# Patient Record
Sex: Female | Born: 1965 | Race: White | Hispanic: No | Marital: Single | Smoking: Never smoker
Health system: Southern US, Community
[De-identification: ages and names within clinical notes are randomized; demographics above are authoritative.]

## PROBLEM LIST (undated history)

## (undated) DIAGNOSIS — C55 Malignant neoplasm of uterus, part unspecified: Secondary | ICD-10-CM

## (undated) DIAGNOSIS — Z8489 Family history of other specified conditions: Secondary | ICD-10-CM

## (undated) DIAGNOSIS — R188 Other ascites: Secondary | ICD-10-CM

## (undated) DIAGNOSIS — Z9889 Other specified postprocedural states: Secondary | ICD-10-CM

## (undated) DIAGNOSIS — D509 Iron deficiency anemia, unspecified: Secondary | ICD-10-CM

## (undated) DIAGNOSIS — L03115 Cellulitis of right lower limb: Secondary | ICD-10-CM

## (undated) DIAGNOSIS — S32020A Wedge compression fracture of second lumbar vertebra, initial encounter for closed fracture: Secondary | ICD-10-CM

## (undated) DIAGNOSIS — R112 Nausea with vomiting, unspecified: Secondary | ICD-10-CM

## (undated) DIAGNOSIS — I82409 Acute embolism and thrombosis of unspecified deep veins of unspecified lower extremity: Secondary | ICD-10-CM

## (undated) DIAGNOSIS — E669 Obesity, unspecified: Secondary | ICD-10-CM

## (undated) DIAGNOSIS — K8020 Calculus of gallbladder without cholecystitis without obstruction: Secondary | ICD-10-CM

## (undated) HISTORY — PX: TONSILLECTOMY AND ADENOIDECTOMY: SUR1326

## (undated) HISTORY — PX: PARACENTESIS: SHX844

## (undated) HISTORY — PX: WISDOM TOOTH EXTRACTION: SHX21

## (undated) HISTORY — DX: Malignant neoplasm of uterus, part unspecified: C55

## (undated) HISTORY — DX: Acute embolism and thrombosis of unspecified deep veins of unspecified lower extremity: I82.409

## (undated) HISTORY — DX: Obesity, unspecified: E66.9

---

## 1988-04-23 HISTORY — PX: ANKLE SURGERY: SHX546

## 2007-04-24 DIAGNOSIS — I82409 Acute embolism and thrombosis of unspecified deep veins of unspecified lower extremity: Secondary | ICD-10-CM

## 2007-04-24 HISTORY — DX: Acute embolism and thrombosis of unspecified deep veins of unspecified lower extremity: I82.409

## 2008-03-30 ENCOUNTER — Inpatient Hospital Stay (HOSPITAL_COMMUNITY): Admission: EM | Admit: 2008-03-30 | Discharge: 2008-04-07 | Payer: Self-pay | Admitting: Emergency Medicine

## 2008-03-30 ENCOUNTER — Ambulatory Visit: Payer: Self-pay | Admitting: Family Medicine

## 2008-03-30 ENCOUNTER — Encounter (HOSPITAL_COMMUNITY): Payer: Self-pay | Admitting: Family Medicine

## 2008-03-30 ENCOUNTER — Encounter: Payer: Self-pay | Admitting: Family Medicine

## 2008-03-30 ENCOUNTER — Ambulatory Visit: Payer: Self-pay | Admitting: Vascular Surgery

## 2008-04-23 DIAGNOSIS — C55 Malignant neoplasm of uterus, part unspecified: Secondary | ICD-10-CM

## 2008-04-23 HISTORY — DX: Malignant neoplasm of uterus, part unspecified: C55

## 2008-06-28 ENCOUNTER — Ambulatory Visit (HOSPITAL_COMMUNITY): Admission: RE | Admit: 2008-06-28 | Discharge: 2008-06-28 | Payer: Self-pay | Admitting: Obstetrics and Gynecology

## 2008-06-28 ENCOUNTER — Encounter (INDEPENDENT_AMBULATORY_CARE_PROVIDER_SITE_OTHER): Payer: Self-pay | Admitting: Obstetrics and Gynecology

## 2008-07-28 ENCOUNTER — Ambulatory Visit: Admission: RE | Admit: 2008-07-28 | Discharge: 2008-07-28 | Payer: Self-pay | Admitting: Gynecologic Oncology

## 2008-08-25 ENCOUNTER — Encounter: Admission: RE | Admit: 2008-08-25 | Discharge: 2008-11-23 | Payer: Self-pay | Admitting: Family Medicine

## 2008-12-21 ENCOUNTER — Encounter: Admission: RE | Admit: 2008-12-21 | Discharge: 2009-03-21 | Payer: Self-pay | Admitting: Family Medicine

## 2009-02-10 ENCOUNTER — Ambulatory Visit: Admission: RE | Admit: 2009-02-10 | Discharge: 2009-02-10 | Payer: Self-pay | Admitting: Gynecologic Oncology

## 2009-02-11 ENCOUNTER — Ambulatory Visit (HOSPITAL_COMMUNITY): Admission: RE | Admit: 2009-02-11 | Discharge: 2009-02-11 | Payer: Self-pay | Admitting: Gynecologic Oncology

## 2009-03-21 ENCOUNTER — Encounter: Admission: RE | Admit: 2009-03-21 | Discharge: 2009-04-20 | Payer: Self-pay | Admitting: Family Medicine

## 2009-05-05 ENCOUNTER — Ambulatory Visit: Admission: RE | Admit: 2009-05-05 | Discharge: 2009-05-05 | Payer: Self-pay | Admitting: Gynecologic Oncology

## 2009-05-06 ENCOUNTER — Encounter: Admission: RE | Admit: 2009-05-06 | Discharge: 2009-08-04 | Payer: Self-pay | Admitting: Family Medicine

## 2009-05-17 ENCOUNTER — Encounter: Admission: RE | Admit: 2009-05-17 | Discharge: 2009-05-17 | Payer: Self-pay | Admitting: Family Medicine

## 2009-09-06 ENCOUNTER — Encounter: Admission: RE | Admit: 2009-09-06 | Discharge: 2009-12-05 | Payer: Self-pay | Admitting: Family Medicine

## 2009-10-27 ENCOUNTER — Ambulatory Visit: Admission: RE | Admit: 2009-10-27 | Discharge: 2009-10-27 | Payer: Self-pay | Admitting: Gynecologic Oncology

## 2009-12-12 ENCOUNTER — Encounter: Admission: RE | Admit: 2009-12-12 | Discharge: 2010-03-12 | Payer: Self-pay | Admitting: Family Medicine

## 2009-12-22 HISTORY — PX: ABDOMINAL HYSTERECTOMY: SHX81

## 2009-12-27 ENCOUNTER — Encounter: Payer: Self-pay | Admitting: Gynecologic Oncology

## 2009-12-27 ENCOUNTER — Ambulatory Visit (HOSPITAL_COMMUNITY): Admission: RE | Admit: 2009-12-27 | Discharge: 2009-12-28 | Payer: Self-pay | Admitting: Gynecologic Oncology

## 2010-02-09 ENCOUNTER — Ambulatory Visit: Admission: RE | Admit: 2010-02-09 | Discharge: 2010-02-09 | Payer: Self-pay | Admitting: Gynecologic Oncology

## 2010-03-21 ENCOUNTER — Encounter
Admission: RE | Admit: 2010-03-21 | Discharge: 2010-05-23 | Payer: Self-pay | Source: Home / Self Care | Attending: Family Medicine | Admitting: Family Medicine

## 2010-06-26 ENCOUNTER — Encounter: Admit: 2010-06-26 | Payer: Self-pay | Admitting: Family Medicine

## 2010-06-26 ENCOUNTER — Encounter: Payer: BC Managed Care – PPO | Attending: Family Medicine | Admitting: *Deleted

## 2010-06-26 DIAGNOSIS — Z713 Dietary counseling and surveillance: Secondary | ICD-10-CM | POA: Insufficient documentation

## 2010-06-28 ENCOUNTER — Other Ambulatory Visit: Payer: Self-pay | Admitting: Obstetrics and Gynecology

## 2010-06-28 DIAGNOSIS — Z1231 Encounter for screening mammogram for malignant neoplasm of breast: Secondary | ICD-10-CM

## 2010-07-06 LAB — CBC
HCT: 42.1 % (ref 36.0–46.0)
MCHC: 35 g/dL (ref 30.0–36.0)
Platelets: 149 10*3/uL — ABNORMAL LOW (ref 150–400)
Platelets: 160 10*3/uL (ref 150–400)
RDW: 12.8 % (ref 11.5–15.5)
RDW: 13.1 % (ref 11.5–15.5)
WBC: 6.2 10*3/uL (ref 4.0–10.5)

## 2010-07-06 LAB — COMPREHENSIVE METABOLIC PANEL
ALT: 41 U/L — ABNORMAL HIGH (ref 0–35)
BUN: 8 mg/dL (ref 6–23)
CO2: 27 mEq/L (ref 19–32)
Calcium: 9 mg/dL (ref 8.4–10.5)
GFR calc Af Amer: >60 mL/min (ref >60)
GFR calc non Af Amer: >60 mL/min (ref >60)
Glucose, Bld: 81 mg/dL (ref 70–99)
Sodium: 140 mEq/L (ref 135–145)
Total Protein: 6.9 g/dL (ref 6.0–8.3)

## 2010-07-06 LAB — DIFFERENTIAL
Basophils Absolute: 0 10*3/uL (ref 0.0–0.1)
Basophils Relative: 1 % (ref 0–1)
Eosinophils Absolute: 0.2 10*3/uL (ref 0.0–0.7)
Eosinophils Relative: 3 % (ref 0–5)
Monocytes Absolute: 0.4 10*3/uL (ref 0.1–1.0)
Neutro Abs: 4 10*3/uL (ref 1.7–7.7)

## 2010-07-06 LAB — SURGICAL PCR SCREEN: Staphylococcus aureus: NEGATIVE

## 2010-07-06 LAB — BASIC METABOLIC PANEL
BUN: 9 mg/dL (ref 6–23)
CO2: 27 mEq/L (ref 19–32)
Calcium: 8.6 mg/dL (ref 8.4–10.5)
Chloride: 106 mEq/L (ref 96–112)
Creatinine, Ser: 0.88 mg/dL (ref 0.4–1.2)
Glucose, Bld: 184 mg/dL — ABNORMAL HIGH (ref 70–99)

## 2010-07-06 LAB — TYPE AND SCREEN

## 2010-07-26 ENCOUNTER — Ambulatory Visit
Admission: RE | Admit: 2010-07-26 | Discharge: 2010-07-26 | Disposition: A | Discharge status: Home / Self Care | Payer: BC Managed Care – PPO | Source: Ambulatory Visit | Attending: Obstetrics and Gynecology | Admitting: Obstetrics and Gynecology

## 2010-07-26 DIAGNOSIS — Z1231 Encounter for screening mammogram for malignant neoplasm of breast: Secondary | ICD-10-CM

## 2010-07-27 ENCOUNTER — Encounter: Payer: BC Managed Care – PPO | Attending: Family Medicine | Admitting: *Deleted

## 2010-07-27 DIAGNOSIS — Z713 Dietary counseling and surveillance: Secondary | ICD-10-CM | POA: Insufficient documentation

## 2010-08-03 LAB — COMPREHENSIVE METABOLIC PANEL
AST: 27 U/L (ref 0–37)
Albumin: 3.6 g/dL (ref 3.5–5.2)
Alkaline Phosphatase: 42 U/L (ref 39–117)
CO2: 23 mEq/L (ref 19–32)
Chloride: 106 mEq/L (ref 96–112)
GFR calc non Af Amer: >60 mL/min (ref >60)
Sodium: 138 mEq/L (ref 135–145)
Total Bilirubin: 0.5 mg/dL (ref 0.3–1.2)
Total Protein: 7.5 g/dL (ref 6.0–8.3)

## 2010-08-03 LAB — HEMOGLOBIN AND HEMATOCRIT, BLOOD
HCT: 32 % — ABNORMAL LOW (ref 36.0–46.0)
Hemoglobin: 10.2 g/dL — ABNORMAL LOW (ref 12.0–15.0)

## 2010-08-03 LAB — PROTIME-INR
INR: 1.4 (ref 0.00–1.49)
Prothrombin Time: 17.6 seconds — ABNORMAL HIGH (ref 11.6–15.2)

## 2010-08-03 LAB — TYPE AND SCREEN

## 2010-08-03 LAB — CBC: WBC: 6.6 10*3/uL (ref 4.0–10.5)

## 2010-08-29 ENCOUNTER — Ambulatory Visit: Payer: BC Managed Care – PPO | Admitting: *Deleted

## 2010-09-05 NOTE — Discharge Summary (Signed)
[name], [name]                ACCOUNT NO.:  [id number]   MEDICAL RECORD NO.:  [id number]          PATIENT TYPE:  OUT   LOCATION:  XRAY                         FACILITY:  [facility]   PHYSICIAN:  Wayne A. Sheffield Slider, M.D.    DATE OF BIRTH:  Dec 06, 1965   DATE OF ADMISSION:  03/30/2008  DATE OF DISCHARGE:  03/30/2008                               DISCHARGE SUMMARY   Interim Discharge Summary   PRIMARY CARE Vito Beg:  None.   DISCHARGE DIAGNOSES:  1. Deep vein thrombosis, right lower extremity.  2. Iron deficiency anemia.  3. Dysfunctional uterine bleeding.  4. Morbid obesity.  5. Upper respiratory infection.   CURRENT MEDICATIONS:  1.Heparin drip per pharmacy  2.Coumadin 12.5 mg p.o. daily per pharmacy.  3.Iron sulfate 325 mg p.o. b.i.d.  1. Tessalon Perles 100 mg p.o. t.i.d.  5.Chloraseptic spray p.r.n.  6.Ambien 10 mg p.o. q.h.s. p.r.n. insomnia  1. Tylenol 1000 mg p.o. q.6h. p.r.n. pain.   DISCONTINUED MEDICATIONS:  None.   CONSULTANT:  Pharmacy for dosing of heparin and Coumadin.   PROCEDURES:  None.   LABS:  INR April 04, 2008 was 1.5, PT 18.8. Hemoglobin 8.7,  hematocrit 28.1, platelets 223. Antiphospholipid syndrome panel, lupus  anticoagulant partial thromboplastin time 101.3, elevated. PTT LA Mix  4:1 77.3, elevated .  Lupus anticoagulant not detected.   Fecal occult blood positive x1, negative x2.  Beta HCG negative.  Ferritin 11. Total iron 25, TIBC 417. D-dimer 3.70 March 30, 2008.   IMAGING:  1. Transabdominal ultrasound of pelvis. Findings:  Uterine      enlargement, 4.1-cm endometrial stripe abnormally thickened.      Cannot exclude mass within endometrial cavity. Left ovary not      visualized right ovary appears enlarged.  2. Dupplex/Doppler-DVT in the right posterior tibial, popliteal and      gastrocnemius veins   BRIEF HOSPITAL COURSE:  A 45 year old female with morbid obesity with  history of dysfunctional uterine bleeding. Currently with right  lower  extremity deep venous thrombosis.   HOSPITAL COURSE:  1. Deep vein thrombosis. The patient was sent from urgent care center      for intermittent right lower extremity pain.  Duplex/venous      Dopplers found to show DVT in the right posterior tibial, popliteal      and gastrocnemius veins . No obvious DVT in the right proximal      thigh or femoral vein.  D-dimer was elevated at 3.70.  The patient      was also found to be anemic upon admission. Therefore, heparin was      started as was unclear whether not the patient had acute bleeding      and Heparin could be reversed  easily.  Heparin and Coumadin were      dosed per pharmacy, currently subtherapeutic as goal is 2-3 for      INR.  The patient will need to continue with therapeutic INR of 2-3      for at least 72-month due to history of DVT.  Of note, DVT  characterized as first episode provoked by morbid obesity.  The      patient has a history of coagulopathy in family. Therefore,      antiphospholipid was done. However, of note cannot reassure that      patient is free of coagulopathy or has coagulopathy secondary to      use of heparin during antiphospholipid draw.  Will need follow up      antiphospholipid panel in approximately 6 weeks.  2. Dysfunctional uterine bleeding.  The patient with history of      dysfunctional uterine bleeding.  Abdominal ultrasound showed      thickened endometrial stripe of 4.1 cm.  During inpatient stay the      patient had two episodes of dysfunctional uterine bleeding.  Per      the patient has history of bleeding heavy for 1-2 days with passing      of large clots, which occurs approximately 2-3 times per month.  On      admission and the patient was initially given Provera 5 mg.      However, did not have any bleeding after that and was discontinued      with current anticoagulation.  With the patient's history and      transabdominal ultrasound findings, the patient will need  further      follow up with gynecology for endometrial biopsy and any further      workup.  Of note, it was very difficult to have clear imaging due      to the patient's habitus.  3. Anemia.  The patient was found to have microcytic anemia after      workup during admission stay.  She was given IV iron 1500 mg      divided by 3 days to help replete iron stores.  She was continued      on iron sulfate 325 mg p.o. b.i.d.  Initially the patient had      positive guaiac. However, guaiac was repeated on stools and was      found to be negative x2.  The patient's hemoglobin remained stable      through admission.  4. Elevated blood pressures on admission. The patient had elevated      blood pressures. However has since been normotensive. No further      management needed.  5. Upper respiratory infection. On hospital day #2 the patient      complained of dry cough and mild sore throat.  There was no      evidence of hypoxia or respiratory distress for concern of      pulmonary embolism with current DVT.  History and clinical exam      suggest upper respiratory infection.  The patient was treated with      Tessalon Perles t.i.d. p.r.n. cough, Chloraseptic spray p.r.n.      will obtain chest x-ray as needed if the patient decompensates.      Currently O2 saturations 90-94% on room air.   DISCHARGE INSTRUCTIONS:  1. Heart-healthy, low-fat, low-sodium diet.  2. Will need to follow up for INR checks, therapeutic goal 2-3.  3. Follow up with OB/GYN for further workup for dysfunctional uterine      bleeding.      Milinda Antis, MD  Electronically Signed      Arnette Norris. Sheffield Slider, M.D.  Electronically Signed    KD/MEDQ  D:  04/04/2008  T:  04/04/2008  Job:  [id number]   cc:  Huel Cote, M.D.

## 2010-09-05 NOTE — Op Note (Signed)
NAMEDESHONDRA, WORST                ACCOUNT NO.:  [id number]   MEDICAL RECORD NO.:  [id number]          PATIENT TYPE:  AMB   LOCATION:  SDC                           FACILITY:  [facility]   PHYSICIAN:  Huel Cote, M.D. DATE OF BIRTH:  Oct 04, 1965   DATE OF PROCEDURE:  06/28/2008  DATE OF DISCHARGE:                               OPERATIVE REPORT   PREOPERATIVE DIAGNOSES:  1. Abnormal uterine bleeding.  2. Morbid obesity.   POSTOPERATIVE DIAGNOSES:  1. Abnormal uterine bleeding.  2. Morbid obesity.   PROCEDURES:  1. Dilation and curettage.  2. Hysteroscopy.  3. Mirena placement.   SURGEON:  Huel Cote, MD   ASSISTANT:  None.   ANESTHESIA:  MAC with 1% paracervical lidocaine block of 20 mL.   FINDINGS:  The cavity could not be adequately distended even with  increasing pressure it did appear normal with an abundant endometrial  lining noted.  There was no significant hysteroscopic deficit only  approximately 50 mL which ruled against any perforation.   SPECIMENS:  Abundant endometrium were sent to pathology.   ESTIMATED BLOOD LOSS:  100 mL.   URINE OUTPUT:  50 mL straight cath prior to procedure.   INTRAVENOUS FLUIDS:  1200 mL LR.   HYSTEROSCOPIC DEFICIT:  As stated was 50 mL.   COMPLICATIONS:  None known.   PROCEDURE:  The patient was taken to the operating room where MAC  anesthesia was obtained without difficulty.  She was then prepped and  draped in the normal sterile fashion in the dorsolithotomy position.  The vagina being prepped, a large grade speculum was placed within the  vagina and cervix identified and injected on the anterior lip with 2 mL  of 1% lidocaine.  It was then grasped with single-tooth tenaculum.  An  additional 10 mL of piece was placed at 2 and 10 o'clock for a  paracervical block.  The small flexible dilator was then utilized and  the cavity felt to be dilated.  Uterus was sounded to approximately 10-  11 cm.  It was sequentially  dilated gently and the hysteroscope placed  within the uterine cavity what was visible did appear to be endometrial  cavity, however, it could not be significantly distended probably due to  the competing intra-abdominal pressure.  There was no significant  deficit suggesting any uterine perforation, however, given that it was  difficult to distend, the hysteroscope was removed.  A very gentle  curettage was attempted and minimal tissue obtained, therefore it was  felt safer to proceed with the endometrial Pipelle, which was placed  within the uterine cavity and on multiple passes an abundant amount of  endometrium was obtained.  The hysteroscope was then introduced into the  uterine once again uterine cavity and once again, it was difficult to  distend but appeared to be mostly just fluffy endometrium with no  obvious perforation noted.  There still did not appear to be any  significant deficit hysteroscopically to suggest any perforation,  therefore it was once again removed several more passes were made with  the Pipelle to remove what  tissue could be removed as the curette seemed  less effective and also little more dangerous given that the cavity  could not be fully assessed.  At the conclusion of the procedure, the  uterus was once again sounded and sounded to approximately 11-12 cm.  The back wall of the fundus was felt to be palpable with the tip of the  sound therefore the Mirena IUD was placed without difficulty and the  strings left significantly longer than usual as this patient is not  sexually active to enable it to be more easy to remove in the office,  which is a very difficult pelvic exam.  Although, the patient did have  slightly more abnormal bleeding; during the procedure, her estimated  blood loss was estimated to be approximately 100 mL at most and there  was none significant at the conclusion of the procedure.  Therefore, all  instruments and sponges were removed  from the patient's vagina.  She was  observed with no significant bleeding noted and therefore was taken to  the recovery room awake and in good condition.  We will check a  hemoglobin and hematocrit on her prior to discharge given her prior  Coumadin therapy, although this morning, her INR seemed normal.      Huel Cote, M.D.  Electronically Signed     KR/MEDQ  D:  06/28/2008  T:  06/28/2008  Job:  [id number]

## 2010-09-05 NOTE — H&P (Signed)
NAMEREEM, FLEURY                ACCOUNT NO.:  [id number]   MEDICAL RECORD NO.:  [id number]          PATIENT TYPE:  OUT   LOCATION:  XRAY                         FACILITY:  [facility]   PHYSICIAN:  Santiago Bumpers. Hensel, M.D.DATE OF BIRTH:  1965/12/22   DATE OF ADMISSION:  03/30/2008  DATE OF DISCHARGE:                              HISTORY & PHYSICAL   PRIMARY CARE PHYSICIAN:  None.   CHIEF COMPLAINT:  Right leg pain.   HISTORY OF PRESENT ILLNESS:  A 45 year old female with history of morbid  obesity not seen by physician in greater than 5 years, presents to the  ED with right lower extremity pain.  The patient previously seen in  urgent care and sent for duplex and venous Dopplers of lower  extremities.  The patient states first episode of pain in leg was 3  weeks ago, described as cramping, located above the ankle and lasted for  approximately 30 minutes.  The patient describes similar episode on  Saturday which lasted 30 minutes as well.  Pain aggravated by placing  pressure on legs, standing, or movement.  Pain alleviated by elevation,  not alleviated by Aleve.  Pain in the right leg associated with  swelling, right leg greater than left over the past few weeks.  The  patient also admits to episode of shortness of breath 2-3 weeks ago when  she had difficulty breathing while walking long distances.  Recurrent  episodes of shortness of breath at that time lasted 3-4 days, however,  since then has had no shortness of breath or chest pain.  Denies recent  travel or long car rides.  Denies birth control or  herbal supplements.  Admits to strong family history of coagulopathies.   PAST MEDICAL HISTORY:  1. Morbid obesity.  2. Dysfunctional uterine bleeding.  Last menstrual period, current.   ALLERGIES:  None.   MEDICATIONS:  None.   PAST SURGICAL HISTORY:  1. Left ankle surgery.  2. Dental surgery, status post wisdom teeth removal.   SOCIAL HISTORY:  The patient lives with  mother, father, and brother.  Occupation, works with [employer].  Tobacco, denies.  Alcohol, occasional on social events.  Drugs, denies.   FAMILY HISTORY:  Family history of coronary artery disease,  hypertension, diabetes.  The patient's father is status post CABG at age  33.  The patient has 2 aunts with blood clots, one with blood clotting  disorder.  Maternal grandmother with lupus disorder.   REVIEW OF SYSTEMS:  GENERAL:  Denies fevers, chills, night sweats, and  change in appetite.  HEENT:  Denies headache and change in vision.  CVS:  Denies chest pain.  Admits to edema in lower extremities.  Denies  palpitations.  RESPIRATORY:  Denies cough, currently denies shortness of breath,  wheezing, denies hemoptysis.  GI:  Denies nausea, vomiting, diarrhea, melena, or abdominal pain.  GU:  Denies dysuria, hematuria.  Admits to heavy bleeding with menstrual  cycles.  SKIN:  History of hyperpigmented rash on lower extremities, history of  cat scratch on left lower extremity.  MUSCULOSKELETAL:  Denies myalgias.  NEURO:  Admits to numbness on lateral aspect of right foot.  HEMATOLOGY:  Denies petechiae, easy bleeding, or easy bruising.   PHYSICAL EXAMINATION:  VITAL SIGNS:  Temperature 97.8, pulse 84,  respiratory rate 18, blood pressure 151/83, and oxygen saturation 100%  on room air. GENERAL:  No acute distress, alert and oriented x3, and  morbid obesity.  HEENT:  PERRL, nonicteric, pink conjunctiva, and moist mucous membranes.  NECK:  No JVD.  No lymphadenopathy.  CVS:  Regular rate and rhythm.  No murmurs, however, difficult to  examine due to the patient's habitus.  LUNGS:  Clear to auscultation bilaterally.  ABDOMEN:  Obese, positive bowel sounds, soft, nontender, and no apparent  distention, due to size unable to determine organomegaly.  Multiple  small excoriated lesions on abdomen.  RECTAL:  Normal rectal tone.  Soft brown stool in vault.  No gross  blood.   EXTREMITIES:  Stasis changes in lower extremities, 1+ pitting edema,  hyperpigmentation of lower extremities, dry skin bilaterally 2 x 3 cm  heel area left lower extremity with hypertrophied scab, white in color.  Warm extremities and negative Homans' sign.  Right lower extremity  nontender.  Pulses 2+.  NEURO:  No focal deficits.  Sensation grossly intact.  Cranial nerves II  through XII grossly intact.  MUSCULOSKELETAL:  No joint tenderness.  No erythema.   LABS AND STUDIES:  Duplex/venous Dopplers of no suboptimal image.  Impression:  DVT right posterior tibial, popliteal and gastrocnemius  vein.  No obvious DVT in right proximal side.  No evidence of DVT in  common femoral vein.  INR 1.0, PT 13.9, PTT 27, D-dimer 3.70.  CBC:  White count 8.2,  hemoglobin 8.9, hematocrit 28.2, platelets 245, MCV 69.1, and RDW 18.4.  Differential within normal limits.  BMET:  Sodium 137, potassium 3.9,  chloride 104, CO2 of 27, BUN 8, creatinine 0.71, glucose 91, and calcium  8.8.   EMERGENCY DEPARTMENT COURSE:  Pharmacy consulted for anticoagulation due  to patient habits.  We will start heparin drip and Coumadin per pharmacy  protocol.   ASSESSMENT AND PLAN:  A 45 year old female admitted with extensive right  lower extremity deep venous thrombosis.  1. Deep venous thrombosis.  Newly diagnosed deep venous thrombosis.      No history of deep venous thrombosis in the past.  No risk factors      for hypercoagulability such as tobacco, oral contraceptives, or      prolonged recent travel.  The patient, however, with strong family      history of coagulopathy.  We will admit and start anticoagulation      with heparin and Coumadin per pharmacy protocol.  Since the patient      found to be anemic on initial CBC and with history of dysfunctional      uterine bleeding, we will hold off Lovenox as heparin can be      discontinued quickly if patient has active bleed.  We will discuss       hypercoagulability, work up with primary team as the patient with      false deep venous thrombosis but has strong family history.  We      will follow up INR, goal INR 2 to 3, and we will bridge heparin to      Coumadin as outpatient.  Patient's education regarding      anticoagulation to be done prior to discharge.  Currently no      symptoms of pulmonary  embolism.  The patient with elevated D-dimer,      however, due to habits we will not attempt to obtain CT angiogram      because management will not be changed if pulmonary embolism found.  2. Hypertension.  The patient's blood pressure was elevated on      admission.  No history of hypertension.  We will monitor blood      pressure, if continuously elevated we will start antihypertensive      agent.  3. Anemia.  The patient with microcytic anemia, history of      dysfunctional uterine bleeding, and currently menstruating.  We      will obtain anemia panel for questionable iron deficiency anemia.      We will obtain transvaginal ultrasound to work up anemia and with      history of dysfunctional uterine bleeding.  We will also start      Provera.  Hemoccult cards will be sent to rule out gastrointestinal      bleed as a cause of anemia.  We will also start iron      supplementations b.i.d.  4. Fluids, electrolytes, nutrition/gastrointestinal.  Heart-healthy      diet.  5. Deep venous thrombosis prophylaxis, on heparin drip.  6. Disposition pending, reaching therapeutic goals for new deep venous      thrombosis.      Milinda Antis, MD  Electronically Signed      Santiago Bumpers. Leveda Anna, M.D.  Electronically Signed    KD/MEDQ  D:  03/30/2008  T:  03/31/2008  Job:  [id number]

## 2010-09-05 NOTE — Discharge Summary (Signed)
NAMEMELLA, INCLAN                ACCOUNT NO.:  [id number]   MEDICAL RECORD NO.:  [id number]          PATIENT TYPE:  INP   LOCATION:  5529                         FACILITY:  [facility]   PHYSICIAN:  Wayne A. Sheffield Slider, M.D.    DATE OF BIRTH:  April 26, 1965   DATE OF ADMISSION:  03/30/2008  DATE OF DISCHARGE:  04/07/2008                               DISCHARGE SUMMARY   PRIMARY CARE Landy Dunnavant:  [facility].   DISCHARGE DIAGNOSES:  1. Deep vein thrombosis, right lower extremity.  2. Iron deficiency anemia.  3. Dysfunctional uterine bleeding.  4. Morbid obesity.  5. Upper respiratory infection.   DISCHARGE MEDICATIONS:  1. Coumadin 10 mg p.o. daily  2. Iron sulfate 325 mg p.o. b.i.d.  3. Tessalon Perles 100 mg p.o. t.i.d. p.r.n. cough.  4. Tylenol 500 mg q.6h. p.r.n. pain,  5,  Compression stockings 30-40 mmHg to lower extremities.   DISCONTINUED MEDICATIONS:  None.   CONSULTS:  Pharmacy for dosing of heparin and Coumadin.   PROCEDURES:  None.   LABORATORY DATA:  CBC - hemoglobin 8.4, hematocrit 26.5, platelets 228.  Of note, hemoglobin 8.9 on admission, hematocrit 28.2 on admission.  INR  2.6.  PT 29.5.  Antiphospholipid panel, lupus anticoagulant partial  thromboplastin time 101.3, elevated.  PTT LA mix 4:1, 77.38, elevated.  Lupus anticoagulant antibody not detected.  Fecal occult blood positive  x1, negative x2.  Beta HCG negative.  Ferritin 11,  total iron 25, TIBC  417, D-dimer of 3.70.   IMAGING:  1. Transabdominal ultrasound of pelvis.  Impression - uterine      enlargement, 4.1-cm endometrial stripe, abnormally thickened.      Cannot exclude mass within endometrial cavity.  Left ovary was not      visualized.  Right ovary appears enlarged.  2. Duplex/Doppler - DVT in the right posterior tibial, popliteal and      gastrocnemius veins.   BRIEF HOSPITAL COURSE:  A 45 year old female with morbid obesity with  history of dysfunctional uterine bleeding  currently with right lower  extremity DVT.  1. DVT.  The patient was sent from the urgent care center for      intermittent right lower extremity pain.  Duplex/Doppler of the      lower extremity showed DVT in the right posterior tibial, popliteal      and gastrocnemius veins with no obvious DVT in the right proximal      thigh or femoral vein.  D-dimer was elevated at 3.70 on admission.      However, due to the patient's habitus, and there would be no change      in therapeutic intervention, CT angiogram was not done.  The      patient was also found to be anemic upon admission; therefore,      heparin was started to bridge to the Coumadin, as it was unclear      whether the patient had acute bleed at that time, and heparin could      be reversed easily.  Heparin and Coumadin were dosed per pharmacy.  Prior to discharge, the patient had been therapeutic with an INR of      2.6.  The patient will need to continue therapeutic INR of 2 to 3      for at least 6 months due to history of DVT.  Recommend prolonged      anticoagulation with morbid obesity and family history of      coagulopathy.  Of note, DVT characterized as first episode provoked      by morbid obesity.  The patient also has a family history of      coagulopathy in the family, antiphospholipid syndrome in the family      and lupus anticoagulant antibody in the family.  Therefore, an      antiphospholipid panel was done; however, one cannot reassure the      patient is free of coagulopathy secondary to the use of heparin      during antiphospholipid draw.  The patient will need follow up      antiphospholipid panel in 6 weeks.  2. Dysfunctional uterine bleeding.  The patient has a history of      dysfunctional uterine bleeding.  Has previously sought medical      care; however, has had no intervention.  Abdominal ultrasound      showed thickened endometrial stripe of 4.1 cm.  During inpatient      stay, the patient had two  episodes of dysfunctional uterine      bleeding.  Per the patient, has a history of heavy bleeding for 1-2      days with passing of large clots which usually occurs approximately      2-3 times per month.  On admission, the patient was initially given      Provera 5 mg; however, menstruation ceased, and therefore Provera      was discontinued.  With the patient's history and transabdominal      ultrasound findings.  The patient will need further follow up with      gynecology for endometrial biopsy and any other further      intervention.  Of note, it was very difficult to have clear imaging      due to the patient's body habitus.  Primary team considered using      Provera on a daily basis for dysfunctional uterine bleeding.      However, there was no clear evidence on the use of Provera in the      setting of DVT and anticoagulation.  Therefore, Provera was held      off.  3. Anemia.  The patient was found to have microcytic anemia after      workup on admission.  IV iron dextran 1500 mg was divided by 3 days      to help replete iron stores.  The patient was then continued on      iron sulfate 325 mg p.o. b.i.d.  Of note, the patient had one      positive guaiac on admission; however, this was thought to be due      to contamination from menstrual cycle.  Furthermore, there were 2      negative guaiac stools after the initial.  The patient's hemoglobin      remained stable throughout admission.  Baseline hemoglobin seems to      be between 8.4 and 8.6.  4. Elevated blood pressures.  The patient had elevated blood pressures      on admission; however, have been normal  since then.  No further      management was needed.  5. Upper respiratory infection.  On hospital day #2, the patient      complained of dry cough and mild sore throat.  There was no      evidence of hypoxia or respiratory distress or concern of pulmonary      embolism with current DVT.  History and clinical exam suggest  upper      respiratory infection.  The patient was treated with supportive      care, as well as Tessalon Perles p.r.n. and Chloraseptic spray.  O2      saturations remained 90-94% on room air.   DISCHARGE INSTRUCTIONS:  1. Heart healthy, low-sodium, low-fat, low-vitamin K diet.  2. Follow up within 24 hours for INR check with new primary care      physician.  Therapeutic goal 2 to 3.  3. Follow up with OB/GYN for further workup for dysfunctional uterine      bleeding.   FOLLOW-UP APPOINTMENTS:  1. [facility] [place], [phone], fax number is [phone], appointment time April 08, 2008 at 9:45 a.m.  2. Dr. Huel Cote, OB/GYN, phone number [phone].  Follow-up      appointment scheduled for May 26, 2008 at 9:00 a.m.  Of note, it was thought that patient was stable to have a follow-up  appointment in February.  Discharge summary, as well as abdominal  ultrasound, was faxed to Dr. Berenda Morale office so that she may review  and may actually schedule an earlier follow-up appointment with the  patient.   DISCHARGE CONDITION:  Stable.   DISPOSITION:  Discharged to home.      Milinda Antis, MD  Electronically Signed      Arnette Norris. Sheffield Slider, M.D.  Electronically Signed    KD/MEDQ  D:  04/07/2008  T:  04/07/2008  Job:  [id number]   cc:   Huel Cote, M.D.

## 2010-09-05 NOTE — H&P (Signed)
[name]                ACCOUNT NO.:  [id number]   MEDICAL RECORD NO.:  [id number]          PATIENT TYPE:  AMB   LOCATION:                                FACILITY:  [facility]   PHYSICIAN:  Huel Cote, M.D. DATE OF BIRTH:  [birth year]   DATE OF ADMISSION:  06/28/2008  DATE OF DISCHARGE:                              HISTORY & PHYSICAL   The patient is a 45 year old nulligravida female who is coming in for a  scheduled hysteroscopy, D and C, and Mirena IUD placement given an  ongoing problem with abnormal uterine bleeding which has intensified  since beginning Coumadin therapy in December as a result of deep vein  thrombosis.  The patient reports that for 5-6 years she has had cycles  bleeding every 14 days to 32 days with heavy clots.  This did intensify  when she was recently in the hospital in December and she pretty much  consistently bled everyday for almost 2 months passing very large clots  and getting down to a hemoglobin of 9.  She had limited workup in the  hospital of an abdominal ultrasound which was of questionable help given  that the patient has morbid obesity and it was very difficult to  visualize any pelvic anatomy very clearly.  She was seen in the office  and did have a pelvic exam; however, secondary to her size and virginal  status it was impossible to obtain an endometrial biopsy.  A Pap smear  was performed and was normal, and the pelvic exam was of limited help.   The patient's past medical history is significant for the DVT in  December 2009 and her morbid obesity.  She denies any other problems  with blood pressure or diabetes or ongoing issues.   Surgeries include an ankle surgery, wisdom teeth removal, and  tonsillectomy.   She has had no history of abnormal Pap smears and has never been  sexually active.   Her family history is significant for breast cancer in her grandmother's  two sisters.  There is no colon cancer.  There is some family  history of  blood clots.   Current medications include Coumadin and occasionally she uses a  diuretic, triamterine/hydrochlorothiazide for lower extremity edema.   On physical exam, her height is 5 feet 11 inches.  Weight is 528 pounds,  blood pressure 100/60.  Cardiac exam is regular rate and rhythm.  Lungs  are clear.  Abdomen is obese and nontender.  She has normal external  genitalia within intact hymen.  Cervix was visible with a long speculum,  but secondary to the patient's discomfort only a Pap smear could be  obtained and no endometrial biopsy.  Bimanual was with little  information due to habitus.  I had discussed with the patient that given  her long history of abnormal bleeding and obesity that she is certainly  at risk for endometrial cancer and really needs endometrial sampling.  Given that this is impossible to perform in the office, we are  proceeding with an outpatient procedure where hopefully the patient  under sedation  can have a hysteroscopy, D and C, and placement of a  Mirena IUD concurrently.  We will send her tissue for pathology for  evaluation certainly, and in which case if there was any endometrial  hyperplasia or atypia or even adenocarcinoma, the IUD would be in place  to help with that as she would probably be a relatively poor candidate  for any other oncological intervention.  The patient's primary medical  doctor is the [facility] [place], and she has been on Coumadin  since her DVT.  She will change to Lovenox in the 2 days prior to her D  and C hysteroscopy and avoid Lovenox for 24 hours prior to the  procedure.  We will check a PT and INR on the day of surgery and proceed  with the hysteroscopy as stated.  We discussed the risks and benefits of  the procedure including bleeding and uterine perforation.  The patient  understands these risks.  I also discussed with her that we might also  disrupt her hymen at the time of procedure given that  we will likely  need to use retraction to see her cervix.  She is okay with this.  We  will not try any prior surgical Cytotec as the patient probably  physically could not place this in any satisfactory way.  She is to have  a preoperative consult with Anesthesia prior to surgery to assess the  safest way to protect her airway, but will likely do an LMA with  possibility of endotracheal intubation if it is felt that is needed.      Huel Cote, M.D.  Electronically Signed     KR/MEDQ  D:  06/24/2008  T:  06/25/2008  Job:  [id number]

## 2010-09-05 NOTE — Consult Note (Signed)
NAMELOZA, PRELL                ACCOUNT NO.:  [id number]   MEDICAL RECORD NO.:  [id number]          PATIENT TYPE:  OUT   LOCATION:  GYN                          FACILITY:  [facility]   PHYSICIAN:  Catherine Schimke, MD     DATE OF BIRTH:  April 26, 1965   DATE OF CONSULTATION:  07/28/2008  DATE OF DISCHARGE:                                 CONSULTATION   REASON FOR VISIT:  [name] was referred by Dr. Senaida Marquez for  evaluation of endometrial cancer.   HISTORY OF PRESENT ILLNESS:  This is a 45 year old nulliparous female,  who states that she has had abnormal bleeding for several years.  She  was diagnosed with a right deep venous thrombus in December 2009 and was  started on Coumadin.  With the onset of Coumadin, the bleeding became  much heavier even with hemorrhage.  She was evaluated Dr. Huel Marquez.  On June 28, 2008, she underwent dilation and curettage,  hysteroscopy and Mirena placement.  This patient is morbidly obese.  The  ability to adequately complete the curettage to uterus was limited.  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  [name] states that since the D and C, she has had no  bleeding.  Of note, she was started on Aygestin 10 mg daily.   PAST MEDICAL HISTORY:  1. Morbid obesity.  2. Right deep venous thrombus.   PAST SURGICAL HISTORY:  Ankle surgery in 1990.   PAST GYN HISTORY:  Menarche prior to the age of 69 with menses occurring  regularly and monthly until 10 years ago when her irregular bleeding  started.   ALLERGIES:  NO KNOWN DRUG ALLERGIES.   SOCIAL HISTORY:  Tobacco use denies.  Alcohol use denies.  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   FAMILY HISTORY:  Notable for a maternal aunt diagnosed with lung cancer  in the 43s.  Another aunt diagnosed with bone cancer in the 45s.  A  third maternal aunt with melanoma diagnosed at the age of 67.  There is  also a paternal aunt with breast  cancer diagnosed in her 49s.  A second  paternal aunt with breast cancer diagnosed in the 86s.   PHYSICAL EXAMINATION:  GENERAL:  Well-developed female in no acute  distress.  VITAL SIGNS:  Weight 530 pounds per patient's report, blood pressure  144/80, height 5 feet 11 inches.  ABDOMEN:  Morbidly obese.  No palpable masses.  PELVIC:  Normal external genitalia.  BARTHOLIN'S/URETHRA/SKENE:  Hymenal ring is intact which precluded  placement of the long Grave's speculum.  The cervix was not palpated.  IUD string could not be identified.  RECTAL:  Good anal sphincter tone.  No palpable nodularity or masses.   IMPRESSION:  Grade 1 endometrial adenocarcinoma.   [name] is a lovely lady.  It is unfortunate that her current weight  precludes definitive treatment, namely surgical staging with adjuvant  therapy as indicated.  At this time, she is asymptomatic.  I have  recommended that we continue Aygestin at the  current dosage.  In the  event that the bleeding should persist and does not abate with increase  use of Aygestin, radiation therapy could be considered.  This is not  likely to be curative, but would be another weapon in our effort to  control the disease.  A long discussion was had with [name]  regarding weight loss.  She is aware that it would require approximately  200 pounds of loss.  She is receptive to the idea of gastric banding or  a medically controlled diet and will pursue those at the [facility].   FOLLOW UP:  [name] will follow up with Dr. Huel Marquez in 3  months and the GYN/ONC Service in 6 months.      Catherine Schimke, MD  Electronically Signed     WB/MEDQ  D:  07/28/2008  T:  07/28/2008  Job:  [id number]   cc:   [name], R.N.  [address]  [place], [place] [address]   [name], M.D.  Fax: [phone]   [name], M.D.  Fax: [phone]

## 2010-09-06 ENCOUNTER — Ambulatory Visit: Payer: BC Managed Care – PPO | Admitting: *Deleted

## 2010-10-04 ENCOUNTER — Ambulatory Visit: Payer: BC Managed Care – PPO | Admitting: *Deleted

## 2010-10-19 ENCOUNTER — Ambulatory Visit: Payer: BC Managed Care – PPO | Admitting: *Deleted

## 2010-11-01 ENCOUNTER — Ambulatory Visit: Payer: BC Managed Care – PPO | Admitting: *Deleted

## 2010-11-21 ENCOUNTER — Ambulatory Visit: Payer: BC Managed Care – PPO | Admitting: *Deleted

## 2010-11-28 ENCOUNTER — Encounter: Payer: Self-pay | Admitting: *Deleted

## 2010-11-28 ENCOUNTER — Encounter: Payer: BC Managed Care – PPO | Attending: Family Medicine | Admitting: *Deleted

## 2010-11-28 DIAGNOSIS — Z713 Dietary counseling and surveillance: Secondary | ICD-10-CM | POA: Insufficient documentation

## 2010-11-28 DIAGNOSIS — E669 Obesity, unspecified: Secondary | ICD-10-CM | POA: Insufficient documentation

## 2010-11-28 NOTE — Progress Notes (Signed)
  Medical Nutrition Therapy:  Appt start time: 1500 end time:  1530.  Assessment:  Primary concerns today: weight managment.   Weight today: 429.7lbs Wt change: 31.2 lbs BMI: 59.9%  MEDICATIONS: Only on a fluid pill and dietary supplements  DIETARY INTAKE:  24-hr recall:  B (8 AM): Granola bar, Dannon [employer] & Fit  Snk (AM) :N/A  L (12 PM): [place] sandwich with cheese, pretzels (small bag)  Snk (3 PM): Dannon Light & Fit Yogurt D (6-8 PM): Chicken casserole with rice/vegetables (1 cup)  OR Hamburger casseroles with pasta Snk (8-9 PM): Sweets, candy, chips, dessert  Recent physical activity: Very limited secondary to stressful family situations (deaths, illnesses, etc) Pt reports that she just purchased a new pedometer and plans to begin walking more.  Estimated energy needs: 1800 calories <200 g carbohydrates 90 g protein 60 g fat  Progress Towards Goal(s):  In progress.   Nutritional Diagnosis:  [place]-3.3 Overweight/obesity As related to physical inactivity and history of poor dietary choices.  As evidenced by pt with BMI >30%.    Intervention:    Eat 3 meals/day, Avoid meal skipping   Increase protein rich foods  Follow "Plate Method" for portion control  Limit carbohydrate1-2 servings/meal   Choose more whole grains, lean protein, low-fat dairy, and fruits/non-starchy vegetables.   Aim for >30 min of physical activity daily using pedometer  Limit sugar-sweetened beverages and concentrated sweets  Avoid night time eating  Monitoring/Evaluation:  Dietary intake, exercise, carbohydrate intake, and body weight in 2 month(s).

## 2010-11-28 NOTE — Patient Instructions (Signed)
   Eat 3 meals/day, Avoid meal skipping   Increase protein rich foods  Limit carbohydrate1-2 servings/meal   Choose more whole grains, lean protein, low-fat dairy, and fruits/non-starchy vegetables.   Aim for >30 min of physical activity daily using pedometer  Limit sugar-sweetened beverages and concentrated sweets  Avoid night time eating

## 2011-01-02 ENCOUNTER — Ambulatory Visit: Payer: BC Managed Care – PPO | Admitting: *Deleted

## 2011-01-23 ENCOUNTER — Ambulatory Visit: Payer: BC Managed Care – PPO | Admitting: *Deleted

## 2011-01-26 LAB — PROTIME-INR
INR: 1.1 (ref 0.00–1.49)
INR: 1.2 (ref 0.00–1.49)
INR: 1.4 (ref 0.00–1.49)
INR: 1.5 (ref 0.00–1.49)
INR: 2 — ABNORMAL HIGH (ref 0.00–1.49)
INR: 2.6 — ABNORMAL HIGH (ref 0.00–1.49)
Prothrombin Time: 15 seconds (ref 11.6–15.2)
Prothrombin Time: 15.1 seconds (ref 11.6–15.2)
Prothrombin Time: 17.3 seconds — ABNORMAL HIGH (ref 11.6–15.2)
Prothrombin Time: 20.2 seconds — ABNORMAL HIGH (ref 11.6–15.2)
Prothrombin Time: 23.8 seconds — ABNORMAL HIGH (ref 11.6–15.2)
Prothrombin Time: 29.5 seconds — ABNORMAL HIGH (ref 11.6–15.2)

## 2011-01-26 LAB — DIFFERENTIAL
Basophils Absolute: 0.1 10*3/uL (ref 0.0–0.1)
Basophils Relative: 1 % (ref 0–1)
Basophils Relative: 1 % (ref 0–1)
Eosinophils Absolute: 0.5 10*3/uL (ref 0.0–0.7)
Eosinophils Absolute: 0.6 10*3/uL (ref 0.0–0.7)
Eosinophils Relative: 7 % — ABNORMAL HIGH (ref 0–5)
Eosinophils Relative: 7 % — ABNORMAL HIGH (ref 0–5)
Lymphocytes Relative: 28 % (ref 12–46)
Monocytes Absolute: 0.4 10*3/uL (ref 0.1–1.0)
Neutrophils Relative %: 57 % (ref 43–77)

## 2011-01-26 LAB — CBC
HCT: 26.1 % — ABNORMAL LOW (ref 36.0–46.0)
HCT: 26.5 % — ABNORMAL LOW (ref 36.0–46.0)
HCT: 26.8 % — ABNORMAL LOW (ref 36.0–46.0)
HCT: 27.1 % — ABNORMAL LOW (ref 36.0–46.0)
HCT: 27.1 % — ABNORMAL LOW (ref 36.0–46.0)
HCT: 28.1 % — ABNORMAL LOW (ref 36.0–46.0)
HCT: 28.2 % — ABNORMAL LOW (ref 36.0–46.0)
HCT: 31.3 % — ABNORMAL LOW (ref 36.0–46.0)
Hemoglobin: 8.1 g/dL — ABNORMAL LOW (ref 12.0–15.0)
Hemoglobin: 8.4 g/dL — ABNORMAL LOW (ref 12.0–15.0)
Hemoglobin: 8.5 g/dL — ABNORMAL LOW (ref 12.0–15.0)
Hemoglobin: 8.5 g/dL — ABNORMAL LOW (ref 12.0–15.0)
Hemoglobin: 8.6 g/dL — ABNORMAL LOW (ref 12.0–15.0)
Hemoglobin: 8.7 g/dL — ABNORMAL LOW (ref 12.0–15.0)
Hemoglobin: 8.9 g/dL — ABNORMAL LOW (ref 12.0–15.0)
Hemoglobin: 9.9 g/dL — ABNORMAL LOW (ref 12.0–15.0)
MCHC: 30.9 g/dL (ref 30.0–36.0)
MCHC: 31 g/dL (ref 30.0–36.0)
MCHC: 31.4 g/dL (ref 30.0–36.0)
MCHC: 31.4 g/dL (ref 30.0–36.0)
MCHC: 31.5 g/dL (ref 30.0–36.0)
MCHC: 31.7 g/dL (ref 30.0–36.0)
MCHC: 31.7 g/dL (ref 30.0–36.0)
MCHC: 31.9 g/dL (ref 30.0–36.0)
MCHC: 32.2 g/dL (ref 30.0–36.0)
MCV: 69.1 fL — ABNORMAL LOW (ref 78.0–100.0)
MCV: 69.8 fL — ABNORMAL LOW (ref 78.0–100.0)
MCV: 71.8 fL — ABNORMAL LOW (ref 78.0–100.0)
MCV: 72.8 fL — ABNORMAL LOW (ref 78.0–100.0)
MCV: 74.4 fL — ABNORMAL LOW (ref 78.0–100.0)
Platelets: 218 10*3/uL (ref 150–400)
Platelets: 222 10*3/uL (ref 150–400)
Platelets: 223 10*3/uL (ref 150–400)
Platelets: 247 10*3/uL (ref 150–400)
Platelets: 260 10*3/uL (ref 150–400)
RBC: 3.51 MIL/uL — ABNORMAL LOW (ref 3.87–5.11)
RBC: 3.6 MIL/uL — ABNORMAL LOW (ref 3.87–5.11)
RBC: 3.72 MIL/uL — ABNORMAL LOW (ref 3.87–5.11)
RBC: 3.91 MIL/uL (ref 3.87–5.11)
RBC: 3.91 MIL/uL (ref 3.87–5.11)
RBC: 4.3 MIL/uL (ref 3.87–5.11)
RDW: 18.4 % — ABNORMAL HIGH (ref 11.5–15.5)
RDW: 19.1 % — ABNORMAL HIGH (ref 11.5–15.5)
RDW: 19.2 % — ABNORMAL HIGH (ref 11.5–15.5)
RDW: 19.7 % — ABNORMAL HIGH (ref 11.5–15.5)
RDW: 19.8 % — ABNORMAL HIGH (ref 11.5–15.5)
RDW: 20.2 % — ABNORMAL HIGH (ref 11.5–15.5)
RDW: 20.7 % — ABNORMAL HIGH (ref 11.5–15.5)
WBC: 6.4 10*3/uL (ref 4.0–10.5)
WBC: 6.5 10*3/uL (ref 4.0–10.5)
WBC: 7.5 10*3/uL (ref 4.0–10.5)

## 2011-01-26 LAB — BASIC METABOLIC PANEL
BUN: 8 mg/dL (ref 6–23)
CO2: 27 mEq/L (ref 19–32)
Glucose, Bld: 91 mg/dL (ref 70–99)
Potassium: 3.9 mEq/L (ref 3.5–5.1)
Sodium: 137 mEq/L (ref 135–145)

## 2011-01-26 LAB — ANTIPHOSPHOLIPID SYNDROME EVAL, BLD
Anticardiolipin IgA: 8 [APL'U] — ABNORMAL LOW (ref <13)
Anticardiolipin IgG: <7 [GPL'U] — ABNORMAL LOW (ref <11)
Antiphosphatidylserine IgA: <20 APS U/mL (ref <20.0)
Antiphosphatidylserine IgM: <25 MPS U/mL (ref <25.0)
PTT Lupus Anticoagulant: 101.3 secs — ABNORMAL HIGH (ref 36.3–48.8)
PTTLA Confirmation: 0.5 secs (ref <8.0)

## 2011-01-26 LAB — IRON AND TIBC
Saturation Ratios: 6 % — ABNORMAL LOW (ref 20–55)
UIBC: 392 ug/dL

## 2011-01-26 LAB — HEPARIN LEVEL (UNFRACTIONATED)
Heparin Unfractionated: 0.38 IU/mL (ref 0.30–0.70)
Heparin Unfractionated: 0.46 IU/mL (ref 0.30–0.70)
Heparin Unfractionated: 0.54 IU/mL (ref 0.30–0.70)
Heparin Unfractionated: 0.61 IU/mL (ref 0.30–0.70)

## 2011-01-26 LAB — FERRITIN: Ferritin: 11 ng/mL (ref 10–291)

## 2011-01-26 LAB — RETICULOCYTES: Retic Count, Absolute: 123.3 10*3/uL (ref 19.0–186.0)

## 2011-02-01 ENCOUNTER — Ambulatory Visit: Payer: BC Managed Care – PPO | Attending: Gynecologic Oncology | Admitting: Gynecologic Oncology

## 2011-02-01 DIAGNOSIS — Z9079 Acquired absence of other genital organ(s): Secondary | ICD-10-CM | POA: Insufficient documentation

## 2011-02-01 DIAGNOSIS — Z90710 Acquired absence of both cervix and uterus: Secondary | ICD-10-CM | POA: Insufficient documentation

## 2011-02-01 DIAGNOSIS — C549 Malignant neoplasm of corpus uteri, unspecified: Secondary | ICD-10-CM | POA: Insufficient documentation

## 2011-02-01 DIAGNOSIS — Z86718 Personal history of other venous thrombosis and embolism: Secondary | ICD-10-CM | POA: Insufficient documentation

## 2011-02-02 NOTE — Consult Note (Signed)
  [name], [name]                ACCOUNT NO.:  [id number]  MEDICAL RECORD NO.:  [id number]  LOCATION:  GYN                          FACILITY:  [facility]  PHYSICIAN:  Laurette Schimke, MD     DATE OF BIRTH:  04-14-1966  DATE OF CONSULTATION:  02/01/2011 DATE OF DISCHARGE:                                CONSULTATION   REASON FOR VISIT:  Consult for surveillance for endometrial cancer.  HISTORY OF PRESENT ILLNESS:  This is a 45 year old diagnosed with a DVT in 2009 and with uterine D and C and hysteroscopy and was notable for the presence of a grade 1 endometrioid endometrial cancer.  Given her morbid obesity, a Mirena IUD was placed and discontinued.  This was still associated with bleeding, progestin was started.  When her BMI decreased to 50.7, she underwent a robotic-assisted laparoscopic hysterectomy, bilateral salpingo-oophorectomy, and mini-laparotomy through the umbilical port with morcellation of the uterus within a bag for delivery of the uterus.  Final pathology was an endometrial adenocarcinoma grade 2 with invasion limited to 1 mm of the myometrium. [name] has been followed with close surveillance.  Pap test was collected by Dr. Senaida Ores in April 2012 was unremarkable.  Vitals, blood pressure 128/68, pulse 70, respiratory rate 18, temperature 97.4, and weight of 420 pounds.  PAST MEDICAL HISTORY: 1. Deep venous thrombosis in 2009. 2. Morbid obesity. 3. Stage IA grade 2 endometrial cancer.  PAST SURGICAL HISTORY: 1. Ankle surgery in 1950. 2. Wisdom tooth removal. 3. Tonsillectomy and adenoidectomy. 4. Hysteroscopy with uterine curettage. 5. Robotic total hysterectomy. 6. Bilateral salpingo-oophorectomy in September 2011.  FAMILY HISTORY:  Interval changes.  MEDICATIONS:  Denies.  REVIEW OF SYSTEMS:  No reports of URI.  No nausea, vomiting, fever, chills, or shortness of breath.  Reports a cough that is nonproductive. ABDOMEN:  Obese.  No evidence of a  hernia.  PELVIC:  Limited, but no palpable masses at the vaginal apex.  Vagina appears fairly well estrogenized.  RECTAL:  Good anal sphincter tone without any masses.  IMPRESSION: 1. [name] is without any evidence of disease from her stage IA     grade 2 endometrial adenocarcinoma.  A     mammogram was obtained in April of 2012 was within normal limits.     She has been advised to follow up in October 2013. 2. Obesity.  She has been advised about the importance of continued     weight loss.     Laurette Schimke, MD     WB/MEDQ  D:  02/01/2011  T:  02/01/2011  Job:  [id number]  cc:   Huel Cote, M.D. Fax: [phone]  Telford Nab, R.N. [address] [place], [place] [address]  Carolynn Sayers, FNP Fax: [phone]  Electronically Signed by Laurette Schimke MD on 02/02/2011 12:12:04 PM

## 2011-03-08 ENCOUNTER — Ambulatory Visit: Payer: BC Managed Care – PPO | Admitting: *Deleted

## 2011-04-05 ENCOUNTER — Ambulatory Visit: Payer: BC Managed Care – PPO | Admitting: *Deleted

## 2011-05-22 ENCOUNTER — Encounter: Payer: Self-pay | Admitting: *Deleted

## 2011-05-22 ENCOUNTER — Encounter: Payer: BC Managed Care – PPO | Attending: Family Medicine | Admitting: *Deleted

## 2011-05-22 DIAGNOSIS — E669 Obesity, unspecified: Secondary | ICD-10-CM | POA: Insufficient documentation

## 2011-05-22 DIAGNOSIS — Z713 Dietary counseling and surveillance: Secondary | ICD-10-CM | POA: Insufficient documentation

## 2011-05-22 NOTE — Progress Notes (Signed)
  Medical Nutrition Therapy:  Appt start time: 0900 end time:  0930.  Assessment:  Primary concerns today: weight managment. Ascencion has experienced a recent weight gain of 40 lbs since August of 2012. She notes that her rapid weight gain is related to holiday eating, stress, and lack of exercise. She reports that night time eating and dinner time portion sizes have  Weight today: 470.1 lbs Wt change: 40.4 lbs gain since 11/2010 BMI: 65.7%  MEDICATIONS: Only on a fluid pill and dietary supplements  DIETARY INTAKE:  24-hr recall:  B (9 AM): Granola bar, Greek Dannon Light & Fit Yogurt (30 carb, 15g protein) Snk (AM) :N/A  L (12-1 PM): [place] sandwich with cheese/mustard ([employer]), pretzels, pickles (45-60g carb) Snk (3 PM): Dannon Light & Fit Yogurt, 10 wheat thins D (6-8 PM): Oven baked chicken, cooked cabbage (1 cup), 1/2 cup stuffing Snk (8-9 PM): Sweets, candy, chips, dessert (has recently cut this out, this week)  Recent physical activity: Very limited secondary to holidays and stressful family situations (deaths, illnesses, etc)  Estimated energy needs: 1800 calories <200 g carbohydrates 90 g protein 60 g fat  Progress Towards Goal(s):  In progress.   Nutritional Diagnosis:  [place]-3.3 Overweight/obesity As related to physical inactivity and history of poor dietary choices.  As evidenced by pt with BMI >30%.    Intervention:    Eat 3 meals/day, Avoid meal skipping   Increase protein rich foods  Follow "Plate Method" for portion control  Limit carbohydrate1-2 servings/meal   Choose more whole grains, lean protein, low-fat dairy, and fruits/non-starchy vegetables.   Aim for >30 min of physical activity daily using pedometer  Limit sugar-sweetened beverages and concentrated sweets  Avoid night time eating  Monitoring/Evaluation:  Dietary intake, exercise, carbohydrate intake, and body weight in 2 month(s).

## 2011-05-22 NOTE — Patient Instructions (Signed)
Intervention:    Eat 3 meals/day, Avoid meal skipping   Increase protein rich foods  Follow "Plate Method" for portion control  Limit carbohydrate1-2 servings/meal   Choose more whole grains, lean protein, low-fat dairy, and fruits/non-starchy vegetables.   Aim for >30 min of physical activity daily using pedometer  Limit sugar-sweetened beverages and concentrated sweets  Avoid night time eating

## 2011-06-19 ENCOUNTER — Ambulatory Visit: Payer: BC Managed Care – PPO | Admitting: *Deleted

## 2011-06-25 ENCOUNTER — Ambulatory Visit: Payer: BC Managed Care – PPO | Admitting: *Deleted

## 2011-06-26 ENCOUNTER — Ambulatory Visit: Payer: BC Managed Care – PPO | Admitting: *Deleted

## 2011-08-02 ENCOUNTER — Ambulatory Visit: Payer: BC Managed Care – PPO | Admitting: *Deleted

## 2011-08-03 ENCOUNTER — Encounter: Payer: Self-pay | Admitting: Dietician

## 2011-08-03 ENCOUNTER — Encounter: Payer: BC Managed Care – PPO | Attending: Family Medicine | Admitting: Dietician

## 2011-08-03 VITALS — Ht 71.0 in | Wt >= 6400 oz

## 2011-08-03 DIAGNOSIS — E669 Obesity, unspecified: Secondary | ICD-10-CM | POA: Insufficient documentation

## 2011-08-03 DIAGNOSIS — Z713 Dietary counseling and surveillance: Secondary | ICD-10-CM | POA: Insufficient documentation

## 2011-08-03 NOTE — Progress Notes (Signed)
  Medical Nutrition Therapy:  Appt start time: 1030 end time:  1100.  Assessment:  Primary concerns today: Continues to want to loose weight .  Since her last appointment on 05/22/2011, she has gained 5.8 lb.  She reports that she has a pedometer and is now trying to increas her walking at work during breaks and at lunch.  Attributes her current weight gain to the increased stress she experiences with her family.  Dad was recently diagnosed with cirrhosis of the liver and is a non-drinker. She and the entire family are finding it hard to cope.  Denies any issues with elevated blood glucose, but has a strong family history of diabetes.  MEDICATIONS: Complete med review  DIETARY INTAKE:  24-hr recall:  B (9:00 AM): protein bar by Fiber One Protein Bar and cheese stick with 5 gm protein  Snk (mid AM) :none  L (12:00-1:00 PM): [place] and swiss cheese and 3 Clausen pickles on whole wheat.with spicy brown; mustard.  Stopped the pretzels.  Doing fruit this week  Snk (3:00 PM): yogurt Dannon Lite and Fit [place] Yogurt. D (6:00-6:30 PM): pork chop with Mrs. Dash, apples with Mrsl Dash (15-20 gm), aparagus, beets, mac and cheese 1/4 cup (10-12 carbs)  Snk (HS PM): Almonds Beverages: water  Recent physical activity: Very limited. Has her pedometer and has been logging 2-3 miles some days walking at work.    Estimated energy needs:HT: 5'11''  WT:475.9 lb BMI:66.5 kg/m2   1800 calories 170-180 g carbohydrates 155-160 g protein 48-50 g fat  Progress Towards Goal(s):  In progress.   Nutritional Diagnosis:  [place]-3.3 Overweight/obesity As related to physical inactivity and history of poor dietary choices.  As evidenced by BMI of >40 %.    Intervention:  Nutrition: Is actively trying to eat fewer carbohydrates.  Current caloric intake is at at 1300-1400 calories at first glance.  Will have her complete a food diary to get a handle on what her intake is at this time.  Mom attended the visit and is supportive  of her weight loss process.  Handouts given during visit include:  Food and Activity Record.  Yellow card with exchange list that has been converted to calories per serving.  Monitoring/Evaluation:  Dietary intake, exercise, and body weight In 8-12 weeks.  To call for an appointment.

## 2011-08-03 NOTE — Patient Instructions (Signed)
   Keep using the pedometer and increase the strips.  Continue to walk at work and relieve stress and get some exercise.  Continue to have protein at all meals and snacks.  Read the food label and use your findings.  Keep sugar in single digits, Keep the protein at around 10 gm per product.  Try to keep a food diary for 3 week days and 1 weekend day, and fax to [facility] Breanna Shorkey [phone]) .

## 2011-08-27 ENCOUNTER — Other Ambulatory Visit: Payer: Self-pay | Admitting: Family Medicine

## 2011-08-27 DIAGNOSIS — Z1231 Encounter for screening mammogram for malignant neoplasm of breast: Secondary | ICD-10-CM

## 2011-09-04 ENCOUNTER — Encounter: Payer: Self-pay | Admitting: Dietician

## 2011-09-04 ENCOUNTER — Encounter: Payer: BC Managed Care – PPO | Attending: Family Medicine | Admitting: Dietician

## 2011-09-04 DIAGNOSIS — Z713 Dietary counseling and surveillance: Secondary | ICD-10-CM | POA: Insufficient documentation

## 2011-09-04 DIAGNOSIS — E669 Obesity, unspecified: Secondary | ICD-10-CM | POA: Insufficient documentation

## 2011-09-04 NOTE — Patient Instructions (Addendum)
   On the weekend days try for 1 large snack and 2 meals.  For protein, aim for doing the grilled items.  If continue to do the salad can take home and do the [place] that you use during the week.  Aim for one container of salad dressing   Two small salads and add the [place] from home.   If eating the nuggets, then cut the number in half.  Aim to use half of the extra sour cream that you order when out ordering ot.

## 2011-09-04 NOTE — Progress Notes (Signed)
  Medical Nutrition Therapy:  Appt start time: 1100 end time:  1130.  Assessment:  Primary concerns today: Concerned that it has been quite hectic and stressful at work.  Has gained 12.1 lb since last visit. Attributes the weight gain to the increased stress and frustrations at work.  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  She finds this quiet stressful.  She has purchased a pedometer and is using it to average 3-4 miles per day of walking.   MEDICATIONS: Review of medications completed  DIETARY INTAKE:Completed a 45 day dietary recall for weeks days and weekends.    24-hr recall:  B ( AM): Protein Bar (170 ca)l, sick of cheese (90) WEEKEND:  2 [employer] bacon and cheese biscuits (840 ) Snk (mid AM) :none  L (mid PM): [place] and cheese sandwich (190 cal), 3/4 cup pineapple (60)  Snk (mid PM): yogurt (80)  Stick of cheese (90) D (early PM): 2 oz chicken, (90),3 oz of potato salad (80), green beans (25), beets (25) WEEKEND: Nacho Supreme with a few chips and cheese dip.  (900+ calories)  Snk (evening PM): none Beverages: water  Recent physical activity: Trying to increase her walking.  Estimated energy needs:Previously calculated at 1800 calories spread throughout the day without skipping meals or at least having a large snack. 1800 calories <200 g carbohydrates 90 g protein 60 g fat  Progress Towards Goal(s):  No progress.   Nutritional Diagnosis:  [place]-3.3 Overweight/obesity As related to physical inactivity and a history of poor dietary choices.  As evidenced by weight of 488 lbs and BMI of 68.2 kg/m2.    Intervention:  Nutrition Review of dietary recall reveals regular meals and some snacks during the week.  On the weekend days, she is skipping meals and is eating as many calories at one meal as she does in an entire day during the week.  Will skip meals and go for 12+  hours without eating.  Insist that many meats that are broiled have a slimy texture and that the fried is a better texture preference.   Continues to gain weight.  Did keep the food journal and I concerned that the food journal is what she Sabel Hornbeck be emotionally looking for to be able to fail at this weight loss.  Handouts given during visit include:  List of the non-starchy veggies.  Journal sheets for recording her intake and documentation of regular nutrient intake every 5 hours during the day on the weekends.  Monitoring/Evaluation:  Dietary intake, exercise, and body weight She wants to follow-up in four weeks.  Will be making an appointment.

## 2011-09-27 ENCOUNTER — Ambulatory Visit
Admission: RE | Admit: 2011-09-27 | Discharge: 2011-09-27 | Disposition: A | Discharge status: Home / Self Care | Payer: BC Managed Care – PPO | Source: Ambulatory Visit | Attending: Family Medicine | Admitting: Family Medicine

## 2011-09-27 DIAGNOSIS — Z1231 Encounter for screening mammogram for malignant neoplasm of breast: Secondary | ICD-10-CM

## 2011-10-03 ENCOUNTER — Encounter: Payer: BC Managed Care – PPO | Attending: Family Medicine | Admitting: Dietician

## 2011-10-03 ENCOUNTER — Encounter: Payer: Self-pay | Admitting: Dietician

## 2011-10-03 ENCOUNTER — Other Ambulatory Visit: Payer: Self-pay | Admitting: Family Medicine

## 2011-10-03 VITALS — Ht 71.0 in | Wt >= 6400 oz

## 2011-10-03 DIAGNOSIS — Z713 Dietary counseling and surveillance: Secondary | ICD-10-CM | POA: Insufficient documentation

## 2011-10-03 DIAGNOSIS — R928 Other abnormal and inconclusive findings on diagnostic imaging of breast: Secondary | ICD-10-CM

## 2011-10-03 DIAGNOSIS — E669 Obesity, unspecified: Secondary | ICD-10-CM

## 2011-10-03 NOTE — Patient Instructions (Addendum)
   To start drinking ore water at the new work site.  Try to move more at work.  Try to do some walking in the covered area at work for stress breaks.  Continue to limit portion sizes.  Continue to not skip meals.

## 2011-10-03 NOTE — Progress Notes (Signed)
  Medical Nutrition Therapy:  Appt start time: 0900 end time:  0930.  Assessment:  Primary concerns today: Current weight gain status.  Has lost 1.2 lbs since last visit 09/04/2011.  Has been using the stairs at work for the last few days.  Pleased with the fact that she is going down instead of up.  Has moved to the new office for the [employer] and is continuing to tey to get her office in order. Feels less stressed in her work situation.  Continues to use her weekend time as a time to not follow her diet.  Will be taking her father to a local steakhouse for a meal for Father's Day.  Acknowledges the fact that this will not be a low calorie event.  This example of the splurging on calories during the weekend days is primarily the force that keeps driving her lack of success with weight loss.  An area that needs some more of her personal attention.  MEDICATIONS: Completed review.  DIETARY INTAKE:  24-hr recall:  B (9:00 AM): Protein bar and stick of cheese, diet Coke Snk (mid- AM) :none  L (12:00-1:00 PM): [place] and cheese sandwich with the clausen pickles and 1/2 cup of fresh pineapple.  Uses Crystal Light Peach tea 32 oz.  Snk (mid  PM): yogurt and stick of cheese with the Crystal Light Raspberry Tea.  Dannon light and fit Geek 80 calories,. D (early PM): Fried chicken tenders,/strips, zucchini and tomatoes ([place]), beets, spoon of stuffing.   Snk (evenings PM): none Beverages: Crystal Light, diet coke   Recent physical activity: [place] more, using the steps at work.   Estimated energy needs: 1800  calories <200 g carbohydrates 90 g protein 60 g fat  Progress Towards Goal(s):  In progress.   Nutritional Diagnosis:  [place]-3.3 Overweight/obesity As related to physical inactivity and a history of poor dietary choices..  As evidenced by weight of 487.8 lb and a BMI of 68.2kg/m2.    Intervention:  Nutrition R .  Handouts given during visit include:  Visit summary.  Monitoring/Evaluation:   Dietary intake, exercise, and body weight In 4 -6 weeks and is to make an appointment.

## 2011-10-07 ENCOUNTER — Encounter: Payer: Self-pay | Admitting: Dietician

## 2011-10-10 ENCOUNTER — Ambulatory Visit
Admission: RE | Admit: 2011-10-10 | Discharge: 2011-10-10 | Disposition: A | Discharge status: Home / Self Care | Payer: BC Managed Care – PPO | Source: Ambulatory Visit | Attending: Family Medicine | Admitting: Family Medicine

## 2011-10-10 DIAGNOSIS — R928 Other abnormal and inconclusive findings on diagnostic imaging of breast: Secondary | ICD-10-CM

## 2011-10-31 ENCOUNTER — Encounter: Payer: BC Managed Care – PPO | Attending: Family Medicine | Admitting: Dietician

## 2011-10-31 ENCOUNTER — Encounter: Payer: Self-pay | Admitting: Dietician

## 2011-10-31 VITALS — Ht 71.0 in | Wt >= 6400 oz

## 2011-10-31 DIAGNOSIS — Z713 Dietary counseling and surveillance: Secondary | ICD-10-CM | POA: Insufficient documentation

## 2011-10-31 DIAGNOSIS — E669 Obesity, unspecified: Secondary | ICD-10-CM | POA: Insufficient documentation

## 2011-10-31 NOTE — Patient Instructions (Addendum)
   Continue with your nutritional plan.  Start to plan for being the cook when Mom goes to surgery.  Continue with your walking and increase as tolerated.

## 2011-10-31 NOTE — Progress Notes (Signed)
  Medical Nutrition Therapy:  Appt start time: 0900 end time:  0930.  Assessment:  Primary concerns today: Continued weight loss at this time. Notes she has been more physically active over the last four weeks and was pleased with her two lb wt. Loss.  MEDICATIONS: Completed a review of medications.  DIETARY INTAKE:  24-hr recall:  B (9:00 AM): Protein bar and stick of cheese, diet coke  Snk (mid AM) :none  L (12:00-1:00 PM): [place] sandwich with a spicy mustard and a clausen pickles and pineapple.  Using Crystal Light Peach Tea.     Snk (mid PM): yogurt and cheese D (early PM): BBQ chicken, cooked cabbage and 1 spoon of mashed potatoes. Raspberry drink   Snk (evening PM): none Beverages: crystal light, diet coke, and water. Total water of 72 + oz per day.  Recent physical activity: Walking more.  Doing the steps at work.  Using them as ofter as possible.  Estimated energy needs:HT: 71 in  WT: 485.8 lb (220.8 kg)  BMI: 67.9 kg/m2   1700-1800 calories <200 g carbohydrates 90 g protein 60 g fat  Progress Towards Goal(s):  In progress.   Nutritional Diagnosis:  [place]-3.3 Overweight/obesity As related to physical inactivity and a history of frequent poor, high calorie dietary choices..  As evidenced by a weith of 485.8 lb and BMI of 67.9 kg.m2..    Intervention:  Nutrition has a current eating pattern/food choices that she is comfortable with.  Is changing the daily routine at the dinner meal.  She reports that she is not straying from her better dietary choices as often as she has in the past.  Handouts given during visit include:  After visit summary.  Monitoring/Evaluation:  Dietary intake, exercise, and body weight in four weeks or as she chooses., will make an appointment.

## 2011-11-27 ENCOUNTER — Ambulatory Visit: Payer: BC Managed Care – PPO | Admitting: Dietician

## 2012-01-01 ENCOUNTER — Encounter: Payer: BC Managed Care – PPO | Attending: Family Medicine | Admitting: Dietician

## 2012-01-01 ENCOUNTER — Encounter: Payer: Self-pay | Admitting: Dietician

## 2012-01-01 VITALS — Ht 71.0 in | Wt >= 6400 oz

## 2012-01-01 DIAGNOSIS — Z713 Dietary counseling and surveillance: Secondary | ICD-10-CM | POA: Insufficient documentation

## 2012-01-01 DIAGNOSIS — E669 Obesity, unspecified: Secondary | ICD-10-CM | POA: Insufficient documentation

## 2012-01-01 NOTE — Progress Notes (Signed)
  Medical Nutrition Therapy:  Appt start time: 0915 end time:  9:45  Assessment:  Primary concerns today: Concerned that she has gained a great deal of weight.  Notes that she and the family have been under a great deal of stress with their father and husband.  He has recently been hospitalized with heart failure related to a failing aortic valve.  At his point, they have been told he will need surgery to survive and this is of an issue in the face of his history of the non-alcoholic cirrhosis.  Her mother has cancelled her knee surgery and continues to go and do and need the support of Jannett and there other family members.  During Dad's recent hospitalization, Mom stayed at the hospital a great deal and Krystena and her brother who depend on their mother to cook, found that eating out at restaurants on a regular basis was the easiest way.  She has gained 6.9 lb since her visit on July 10., 2013.   She has also experienced a bladder infection with a run of 3 antibiotics.   MEDICATIONS: Med review completed.  Meds unchanged  DIETARY INTAKE:  24-hr recall:  B (9:00 AM): Protein bar and stick of cheese, diet coke  Snk (mid AM) :none  L (12:00-1:00 PM): [place] sandwich with swiss cheese and spicy mustard and a Clausen pickles, pineapple or grapefruit slices.  Raspberry flavored die drink.  Snk (mid PM): yogurt and cheese stick D (early PM):BBQ chicken, turnips, and cooked cabbage.  Will drink Raspberry diet drink  Snk (evening PM): nond Beverages: crystal light, diet coke and water  Recent physical activity: At this time continues to do the steps at work  Estimated energy needs:  Ht: 71 in  WT: 493.7 lb  BMI: 69 Adj WT: 200 lb (91 kg) 1799-1800 calories <200 g carbohydrates 90 g protein 60 g fat  Progress Towards Goal(s):  No progress.   Nutritional Diagnosis:  [place]-3.3 Overweight/obesity As related to physical inactivity and a history of frequent, poor, high caloric dietary choices..  As  evidenced by a weight of 493.7 lb and a BMI of 69 kg/m2.    Intervention:  Nutrition She has currently established a fairly healthy work week eating pattern as long as her mother is cooking dinner.  On the weekends, she will skip breakfast and other meals and go to the restaurant for a large meal one time per day.  We reviewed food shopping strategies, planned meals with freezing and the use of purchased frozen and other prepared foods for leaner, lower calorie meals.  Handouts given during visit include:  After visit summary.  Monitoring/Evaluation:  Dietary intake, exercise, and body weight when her family's health has stabalized and and she is again taking care of herself.

## 2012-01-31 ENCOUNTER — Ambulatory Visit: Payer: BC Managed Care – PPO | Admitting: Gynecologic Oncology

## 2012-02-05 ENCOUNTER — Ambulatory Visit: Payer: BC Managed Care – PPO | Attending: Gynecologic Oncology | Admitting: Gynecologic Oncology

## 2012-02-05 ENCOUNTER — Encounter: Payer: Self-pay | Admitting: Gynecologic Oncology

## 2012-02-05 VITALS — BP 110/64 | HR 80 | Temp 97.7°F | Resp 20 | Ht 68.19 in | Wt >= 6400 oz

## 2012-02-05 DIAGNOSIS — Z86718 Personal history of other venous thrombosis and embolism: Secondary | ICD-10-CM | POA: Insufficient documentation

## 2012-02-05 DIAGNOSIS — Z7901 Long term (current) use of anticoagulants: Secondary | ICD-10-CM | POA: Insufficient documentation

## 2012-02-05 DIAGNOSIS — C541 Malignant neoplasm of endometrium: Secondary | ICD-10-CM

## 2012-02-05 DIAGNOSIS — Z9079 Acquired absence of other genital organ(s): Secondary | ICD-10-CM | POA: Insufficient documentation

## 2012-02-05 DIAGNOSIS — Z90710 Acquired absence of both cervix and uterus: Secondary | ICD-10-CM | POA: Insufficient documentation

## 2012-02-05 DIAGNOSIS — E669 Obesity, unspecified: Secondary | ICD-10-CM | POA: Insufficient documentation

## 2012-02-05 DIAGNOSIS — C549 Malignant neoplasm of corpus uteri, unspecified: Secondary | ICD-10-CM | POA: Insufficient documentation

## 2012-02-05 DIAGNOSIS — Z79899 Other long term (current) drug therapy: Secondary | ICD-10-CM | POA: Insufficient documentation

## 2012-02-05 NOTE — Patient Instructions (Addendum)
Please follow up with Dr. Huel Cote in 6 months and Dr. Nelly Rout at Gynecologic Oncology in 1 year.  Please contact the GYN Oncology office for development of any symptoms of recurrence, issues, or concerns.  Please increase your participation in stress relieving activities and begin weight loss measures as soon as you are able.  Thank you for coming to see [place] today.  We appreciate your confidence in choosing [facility] for your medical care.  If you have any questions about your visit today, please call our office and we will get back to you as soon as possible.  Dr. Laurette Schimke and Warner Mccreedy, NP Gynecologic Oncology

## 2012-02-05 NOTE — Progress Notes (Signed)
Follow Up Note: Gyn-Onc  [name] 46 y.o. female  CC:  Chief Complaint  Patient presents with  . Endometrial cancer    Follow up   HPI:  [name] is a 46 year old female diagnosed with a DVT in March of 2009 with Coumadin initiated at that time.  Excessive uterine bleeding developed leading to a dilatation and curettage with hysteroscopy.  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  Due to her morbid obesity, a Mirena IUD was placed along with the addition of Aygestin.  After dramatic, directed weight loss and nutritional support, she went from 523 lbs on her initial visit to 359 lbs in July of 2011.  Her BMI was reduced to 50.  On December 27, 2009, she underwent a robotic-assisted laparoscopic hysterectomy, bilateral salpingo-oophorectomy, and mini-laparotomy through the umbilical port with morcellation of the uterus within a bag for delivery of the uterus. 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   Interval History:  [name] presents today for follow up.  Reporting last visit with Dr. Senaida Ores in May 2013 with normal pap smear obtained at that time.  Weight has increased from 420 lbs at her last visit in October 2012 to 497 lbs reported today.  Last appointment with a nutritionist was in September 2013.  Reporting recent stress related to familial issues including her father having recent cardiac surgery.  Stating that she has recently recovered from a bladder infection.  Reporting no dysuria or hematuria at that time with severe back pain as the presenting symptom.  Denies vaginal or rectal bleeding since her last visit.  Review of Systems  Constitutional:  Feels well  Cardiovascular:  No chest pain, shortness of breath, or edema  Pulmonary:  No cough or wheeze  Gastrointestinal:  No nausea, vomiting, constipation, or diarrhea.  Denies change in bowel movements.  No bright red  blood per rectum Genitourinary:  No frequency, urgency, dysuria, no vaginal discharge or bleeding  Neurologic:  No weakness, numbness, change in gait  Psychology:  Reports feeling stressed at times   Current Meds:  Outpatient Encounter Prescriptions as of 02/05/2012  Medication Sig Dispense Refill  . cyanocobalamin 500 MCG tablet Take 2,500 mcg by mouth daily.       . folic acid (FOLVITE) 400 MCG tablet Take 400 mcg by mouth daily.        . Multiple Vitamins-Minerals (MULTIVITAMIN WITH MINERALS) tablet Take 1 tablet by mouth daily.        Marland Kitchen triamterene-hydrochlorothiazide (MAXZIDE-25) 37.5-25 MG per tablet Take 1 tablet by mouth daily.        . vitamin C (ASCORBIC ACID) 500 MG tablet Take 1,000 mg by mouth daily.         Allergy: No Known Allergies  Social Hx:   History   Social History  . Marital Status: Single    Spouse Name: N/A    Number of Children: N/A  . Years of Education: N/A   Occupational History  . Not on file.   Social History Main Topics  . Smoking status: Never Smoker   . Smokeless tobacco: Not on file  . Alcohol Use: Yes     social, Occassionally  . Drug Use: No  . Sexually Active: No   Other Topics Concern  . Not on file   Social History Narrative  . No narrative on file   Past Surgical Hx:  Past Surgical History  Procedure  Date  . Ankle surgery 1950  . Wisdom tooth extraction   . Tonsillectomy and adenoidectomy   . Hysteroscopy w/d&c   . Abdominal hysterectomy 12/2009    [facility], BSO   Past Medical Hx:  Past Medical History  Diagnosis Date  . Uterine cancer   . Obesity   . DVT (deep venous thrombosis) 2009   Family Hx: No family history on file.  Vitals:  Blood pressure 110/64, pulse 80, temperature 97.7 F (36.5 C), temperature source Oral, resp. rate 20, height 5' 8.19" (1.732 m), weight 497 lb (225.438 kg).  Physical Exam:  General:  Well developed, well nourished in no acute distress.  Alert and oriented x3. Neck:  Supple NROM,  without any enlargements.  Lymph Node Survey:  No cervical supraclavicular or inguinal adenopathy.  Respiratory:  Lungs clear to auscultation.  No wheezes, crackles, or rhonchi noted. Cardiac:  Heart rate and rhythm regular, S1 and S2 normal.  No murmurs, clicks, or rubs noted. GI: Normoactive bowel sounds.  Abdomen soft, non-tender and obese. Umbilical surgical incision intact without evidence of hernia.   Back:  No CVA tenderness noted. Skin:  Skin between skin folds on the lower abdomen excoriated and erythematous due to moisture.  No rash noted. Pelvic:  Vulva: Normal external female genitalia. No lesions. No discharge or bleeding.    Urethra: No lesions or masses.    Vagina: atrophic.  No lesions or masses noted.    Rectal:  Good tone, no masses, no cul de sac nodularity.  Extremities:  No bilateral cyanosis, clubbing or edema.   Assessment/Plan: [name] is a 46 year old female with Stage IA, grade 2 endometrial adenocarcinoma with no evidence of recurrence at this time.  She is to follow up with Dr. Huel Cote in 6 months and Gynecologic Oncology in 1 year.  Reportable signs and symptoms of recurrence reviewed and instructed to contact the GYN Oncology office for development of any symptoms, issues, or concerns.  Notable  150lb weight gain over the past 2 years. She is to participate in stress relieving activities and begin weight loss measures as soon as she is able.  Dr. Laurette Schimke was present during the examination and concurs with the above.  CROSS, MELISSA DEAL, NP 02/05/2012, 11:42 AM

## 2012-02-14 ENCOUNTER — Ambulatory Visit: Payer: BC Managed Care – PPO | Admitting: Gynecologic Oncology

## 2012-04-30 ENCOUNTER — Encounter: Payer: Self-pay | Admitting: Dietician

## 2012-04-30 ENCOUNTER — Encounter: Payer: BC Managed Care – PPO | Attending: Family Medicine | Admitting: Dietician

## 2012-04-30 VITALS — Ht 71.0 in | Wt >= 6400 oz

## 2012-04-30 DIAGNOSIS — Z713 Dietary counseling and surveillance: Secondary | ICD-10-CM | POA: Insufficient documentation

## 2012-04-30 DIAGNOSIS — E669 Obesity, unspecified: Secondary | ICD-10-CM | POA: Insufficient documentation

## 2012-04-30 NOTE — Progress Notes (Signed)
  Medical Nutrition Therapy:  Appt start time: 1020 end time:  1050  Assessment:  Primary concerns today: With increased family stress has gained weight and is concerned that she needs to get back to losing weight. Has been snacking more and is not using portion control at her evening meal.  Has been eating the sweetened dessert and snack items that a colleague is bringing to work. Has gained 16.9 lb since 01/01/2012.    She is accompanied by her mother and both verbalized concern regarding her weight gain.  Her dad had a heart valve replacement in October and has had issues emotionally adjusting to the need for him to rehab and to lose weight and regain some of his previous level of conditioning.  This has been a problem for the entire family and the mother is about cooking all the foods the dad is demanding.  With hospitalization for her knee surgery and recovery, it will fall to Runa, her dad and brother to take control of the evening meal.  Layah and her mom are concerned that she will not maintain a healthy eating pattern and resort to bringing in a lot of take out foods.  MEDICATIONS: completed review.  DIETARY INTAKE:  Continues to have the protein bar and cheese stick for breakfast.  Most days will eat the sandwich that her mom packs and have her grapefruit slices.  Her down fall is that she is in to heavy snacking at work.  In the evening, her mother reports that she is not measuring and monitoring her portions as she has done in the past.  In addition, she is snacking a great deal in the evenings.   Recent physical activity:Has not maintained a regular exercise regimen.  Does agree with her mother that she will join her when it is time for her to start walking during her rehab period.  Estimated energy needs:  HT: 71 in  WT: 510.6 lb  BMI: 71.4 kg/m2  Adjust WT: 200 lb  (91 kg) 1700-1800 calories <200 g carbohydrates 90 g protein 60 g fat  Progress Towards Goal(s):  No  progress.   Nutritional Diagnosis:  [place]-3.3 Overweight/obesity As related to physical inacitivity ans a history of frequent, poor, and high caloric dietary choices.  As evidenced by a weight of 510.6 lb with a BMI of 71.4 kg/m2.    Intervention:  Nutrition Plan to take responsibility for your health and eating.  You need to step up to the plate and get into the game of health.When using the grapefruit sections, pour off the juice and eliminate 60-80 calories.  Plan to not snack on the sweet goodies at the office.  Consider taking some more cheese or non-starchy veggies to snack on.  Plan to practice measuring starch portions at the evening meals.  Plan to start to walk and pre-condition yourself so that you will be ready to walk with mom after her surgery.  Plan evening snacks.  With mom, plan some dished to use for dinner that can be frozen and heated up for dinner and then practice portion control at the meal.  Plan to use all the non-starchy veggies at the evening meal that you want.   Handouts given during visit include:  Post visit suggestions.  Monitoring/Evaluation:  Dietary intake, exercise, and body weight in 6 weeks.Marland Kitchen

## 2012-04-30 NOTE — Patient Instructions (Addendum)
   Pour off the juice from the grapefruit cups.  Get back to measuring portions at the evening meal.   Plan to get back at walking. Aim to pre-condition and be ready to walk with Mom.  Plan for the evening snacks.    With Mom, plan some dishes that can be frozen and then heated for dinner.  Then practice portion control.  Plan to use a lot of the non-starchy veggies at the evening meal.

## 2012-05-28 ENCOUNTER — Encounter: Payer: BC Managed Care – PPO | Attending: Family Medicine | Admitting: Dietician

## 2012-05-28 ENCOUNTER — Encounter: Payer: Self-pay | Admitting: Dietician

## 2012-05-28 VITALS — Ht 71.0 in | Wt >= 6400 oz

## 2012-05-28 DIAGNOSIS — Z713 Dietary counseling and surveillance: Secondary | ICD-10-CM | POA: Insufficient documentation

## 2012-05-28 DIAGNOSIS — E669 Obesity, unspecified: Secondary | ICD-10-CM

## 2012-05-28 NOTE — Progress Notes (Signed)
  Medical Nutrition Therapy:  Appt start time: 0900 end time:  0930.  Assessment:  Primary concerns today: Continues to look at work at weight loss.  Today has gained to 511.6 lb today, a gain of 3-4 oz since appointment on 04/30/2012.  Family stress continues with heating issues of the last few days. Continues to have difficulty focusing on goal of weight loss.  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  The menu was typical and she commented that she did not want to even think of the amount of calories she consumed.  I am searching for the trigger to get her involved in her weight loss.  She notes that she has decreased the high calorie snacking at work. Today, her gait continues slow.  She reports a high volume of fluid intake daily with only 1-3 trips to the bathroom during a 24 hr period.  Wearing her compression stockings and does have some edema around the top of the stocking. Her goal is to try to lose some weight by next month.  I fear that assisting her mother post-op Sharlee Rufino decrease her focus on weight loss even more.  MEDICATIONS: Completed med review.  DIETARY INTAKE:  24-hr recall:  B (9:00 AM): Protein bar and stick cheese.  Diet Coke  Snk (mid AM) :none  L (12:00-1:00 PM): sandwich of swiss cheese and [place] on whole wheat with Clausen pickle (3 slices) with the grape fruit slices.  Snk (mid PM): wheat thins and cheese sticks.  Also adding the cottage cheese with fruit from [employer].  (100 calories). Raspberry tea. D (6:00-6;39 PM): Finished up the [employer].  Meatballs (BBQ) and sausage balls and spinach dip.    Snk (HS PM): Slice of cake Beverages: Raspberry tea, diet Coke,Peach tea crystal Light.  (116 oz per day), diet ginger ale   Recent physical activity: Trying to walk more in the office.    Estimated energy needs: HT:  71 in   WT:  511.6 lb  BMI: 71.5 kg/m2   Adj WT: 200 lb  (91 kg) 1700-1800 calories <200 g carbohydrates 90 g protein 60  g fat  Progress Towards Goal(s):  No progress.   Nutritional Diagnosis:  [place]-3.3 Overweight/obesity As related to physical inactivity and a history of frequent, poor, and high calorie dietary choices..  As evidenced by a weight of 511.6 lb with a BMI of 71.5 kg.    Intervention:  Nutrition Recommendations.   Try to keep up the walking at work in the building.  Keep using the Crystal Light Tea throughout the day.  Get back to measuring portions at dinner, with Mom out of commission, try the divided plate.  This weekend do plan the menu and buy the groceries.    Plan for a non-starchy veggie each evening meal.  Call MD regarding possibly retaining fluids.  Handouts given during visit:  [employer].  Monitoring/Evaluation:  Dietary intake, exercise, and body weight in 4 weeks as requested.Marland Kitchen

## 2012-05-28 NOTE — Patient Instructions (Addendum)
   Try to keep up the walking at work in the building.  Keep using the Crystal Light Tea throughout the day.  Get back to measuring portions at dinner, with Mom out of commission, try the divided plate.  This weekend do plan the menu and buy the groceries.    Plan for a non-starchy veggie each evening meal.  Call MD regarding possibly retaining fluids.

## 2012-06-18 ENCOUNTER — Ambulatory Visit: Payer: BC Managed Care – PPO | Admitting: Dietician

## 2012-07-16 ENCOUNTER — Ambulatory Visit: Payer: BC Managed Care – PPO | Admitting: Dietician

## 2012-08-06 ENCOUNTER — Ambulatory Visit: Payer: BC Managed Care – PPO | Admitting: Dietician

## 2012-09-02 ENCOUNTER — Ambulatory Visit: Payer: BC Managed Care – PPO | Admitting: Dietician

## 2012-09-03 ENCOUNTER — Telehealth: Payer: Self-pay | Admitting: Gynecologic Oncology

## 2012-09-03 NOTE — Telephone Encounter (Signed)
Follow Up Note: Gyn-Onc  Aline Wesche 47 y.o. female  CC: Endometrial cancer surveillance  Assessment/Plan: Ms. Taddeo is a 47 y.o. with Stage IA, grade 2 endometrial adenocarcinoma with no evidence of recurrence at this time.  She is to follow up with Dr. Huel Cote in 6 months and Gynecologic Oncology in 1 year.  Reportable signs and symptoms of recurrence reviewed and instructed to contact the GYN Oncology office for development of any symptoms, issues, or concerns.  Significant weight gain over the past 2 years. She is to participate in stress relieving activities and begin weight loss measures as soon as she is able.   HPI:  Rindi Beechy is a 47 y.o.  diagnosed with a DVT in March of 2009 with Coumadin initiated at that time.  Excessive uterine bleeding developed leading to a dilatation and curettage with hysteroscopy.  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  Due to her morbid obesity, a Mirena IUD was placed along with the addition of Aygestin.  After dramatic, directed weight loss and nutritional support, she went from 523 lbs on her initial visit to 359 lbs in July of 2011.  Her BMI was reduced to 50.  On December 27, 2009, she underwent a robotic-assisted laparoscopic hysterectomy, bilateral salpingo-oophorectomy, and mini-laparotomy through the umbilical port with morcellation of the uterus within a bag for delivery of the uterus. 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    Ms. Dargis presents today for follow up.  Reporting last visit with Dr. Senaida Ores in May 2013 with normal pap smear obtained at that time.  Weight has increased from 420 lbs at her last visit in October 2012 to 515 lbs at her nutrition visit  05/2012.  Last appointment with a nutritionist was in September 2013.  Reporting recent stress related to familial issues including her father having recent cardiac surgery.   Stating that she has recently recovered from a bladder infection.  Reporting no dysuria or hematuria at that time with severe back pain as the presenting symptom.  Denies vaginal or rectal bleeding since her last visit.   Past Surgical Hx:  Past Surgical History  Procedure Laterality Date  . Ankle surgery  1950  . Wisdom tooth extraction    . Tonsillectomy and adenoidectomy    . Hysteroscopy w/d&c    . Abdominal hysterectomy  12/2009    RLH, BSO   Past Medical Hx:  Past Medical History  Diagnosis Date  . Uterine cancer   . Obesity   . DVT (deep venous thrombosis) 2009   Family Hx:  Family History  Problem Relation Age of Onset  . Heart disease Father   . Diabetes Brother    REVIEW OF SYSTEMS Constitutional  Feels well  Skin/Breast  No rash, sores, jaundice, itching, dryness, lumps, swelling, breast pain, or nipple discharge. Age spots  Cardiovascular  No chest pain, shortness of breath, or edema  Pulmonary  No cough or wheeze.  Gastro Intestinal  No nausea, vomitting, or diarrhoea. No bright red blood per rectum, no abdominal pain, change in bowel movement, or constipation.  Genito Urinary  No frequency, urgency, dysuria,  Musculo Skeletal  No myalgia, arthralgia, joint swelling or pain  Neurologic  No weakness, numbness, change in gait,  Psychology  No depression, anxiety, insomnia.    Vitals:  Blood pressure 110/64, pulse 80, temperature 97.7 F (36.5 C), temperature source Oral, resp. rate 20, height 5'  8.19" (1.732 m), weight 497 lb (225.438 kg).  Physical Exam:  General:  Well developed, well nourished in no acute distress.  Alert and oriented x3. Neck:  Supple NROM, without any enlargements.  Lymph Node Survey:  No cervical supraclavicular or inguinal adenopathy.  Respiratory:  Lungs clear to auscultation.  No wheezes, crackles, or rhonchi noted. Cardiac:  Heart rate and rhythm regular, S1 and S2 normal.  No murmurs, clicks, or rubs noted. GI: Normoactive  bowel sounds.  Abdomen soft, non-tender and obese. Umbilical surgical incision intact without evidence of hernia.   Back:  No CVA tenderness noted. Skin:  Skin between skin folds on the lower abdomen excoriated and erythematous due to moisture.  No rash noted. Pelvic:  Vulva: Normal external female genitalia. No lesions. No discharge or bleeding.    Urethra: No lesions or masses.    Vagina: atrophic.  No lesions or masses noted.    Rectal:  Good tone, no masses, no cul de sac nodularity.  Extremities:  No bilateral cyanosis, clubbing or edema.   Laurette Schimke, NP 09/03/2012, 8:58 PM

## 2012-09-04 ENCOUNTER — Emergency Department (HOSPITAL_COMMUNITY)
Admission: EM | Admit: 2012-09-04 | Discharge: 2012-09-04 | Disposition: A | Discharge status: Home / Self Care | Payer: BC Managed Care – PPO | Attending: Emergency Medicine | Admitting: Emergency Medicine

## 2012-09-04 ENCOUNTER — Encounter (HOSPITAL_COMMUNITY): Payer: Self-pay | Admitting: Cardiology

## 2012-09-04 ENCOUNTER — Encounter: Payer: Self-pay | Admitting: Gynecologic Oncology

## 2012-09-04 ENCOUNTER — Ambulatory Visit: Payer: BC Managed Care – PPO | Attending: Gynecologic Oncology | Admitting: Gynecologic Oncology

## 2012-09-04 ENCOUNTER — Emergency Department (HOSPITAL_COMMUNITY): Payer: BC Managed Care – PPO

## 2012-09-04 VITALS — BP 100/60 | HR 74 | Temp 98.0°F | Resp 20 | Ht 68.0 in | Wt >= 6400 oz

## 2012-09-04 DIAGNOSIS — Z86718 Personal history of other venous thrombosis and embolism: Secondary | ICD-10-CM | POA: Insufficient documentation

## 2012-09-04 DIAGNOSIS — Z8542 Personal history of malignant neoplasm of other parts of uterus: Secondary | ICD-10-CM | POA: Insufficient documentation

## 2012-09-04 DIAGNOSIS — K8020 Calculus of gallbladder without cholecystitis without obstruction: Secondary | ICD-10-CM | POA: Insufficient documentation

## 2012-09-04 DIAGNOSIS — C541 Malignant neoplasm of endometrium: Secondary | ICD-10-CM

## 2012-09-04 DIAGNOSIS — R110 Nausea: Secondary | ICD-10-CM | POA: Insufficient documentation

## 2012-09-04 DIAGNOSIS — Z9079 Acquired absence of other genital organ(s): Secondary | ICD-10-CM | POA: Insufficient documentation

## 2012-09-04 DIAGNOSIS — K625 Hemorrhage of anus and rectum: Secondary | ICD-10-CM

## 2012-09-04 DIAGNOSIS — R109 Unspecified abdominal pain: Secondary | ICD-10-CM | POA: Insufficient documentation

## 2012-09-04 DIAGNOSIS — R1011 Right upper quadrant pain: Secondary | ICD-10-CM | POA: Insufficient documentation

## 2012-09-04 DIAGNOSIS — K644 Residual hemorrhoidal skin tags: Secondary | ICD-10-CM | POA: Insufficient documentation

## 2012-09-04 DIAGNOSIS — R300 Dysuria: Secondary | ICD-10-CM

## 2012-09-04 DIAGNOSIS — K5900 Constipation, unspecified: Secondary | ICD-10-CM | POA: Insufficient documentation

## 2012-09-04 DIAGNOSIS — Z90710 Acquired absence of both cervix and uterus: Secondary | ICD-10-CM | POA: Insufficient documentation

## 2012-09-04 DIAGNOSIS — N390 Urinary tract infection, site not specified: Secondary | ICD-10-CM | POA: Insufficient documentation

## 2012-09-04 DIAGNOSIS — Z79899 Other long term (current) drug therapy: Secondary | ICD-10-CM | POA: Insufficient documentation

## 2012-09-04 DIAGNOSIS — Z7901 Long term (current) use of anticoagulants: Secondary | ICD-10-CM | POA: Insufficient documentation

## 2012-09-04 DIAGNOSIS — K649 Unspecified hemorrhoids: Secondary | ICD-10-CM

## 2012-09-04 DIAGNOSIS — C549 Malignant neoplasm of corpus uteri, unspecified: Secondary | ICD-10-CM | POA: Insufficient documentation

## 2012-09-04 LAB — CBC WITH DIFFERENTIAL/PLATELET
Basophils Absolute: 0 10*3/uL (ref 0.0–0.1)
Basophils Relative: 0 % (ref 0–1)
Eosinophils Absolute: 0.3 10*3/uL (ref 0.0–0.7)
Eosinophils Relative: 3 % (ref 0–5)
HCT: 40.3 % (ref 36.0–46.0)
Hemoglobin: 13.2 g/dL (ref 12.0–15.0)
MCH: 28.4 pg (ref 26.0–34.0)
MCHC: 32.8 g/dL (ref 30.0–36.0)
MCV: 86.9 fL (ref 78.0–100.0)
Monocytes Absolute: 0.6 10*3/uL (ref 0.1–1.0)
Monocytes Relative: 7 % (ref 3–12)
RDW: 13.9 % (ref 11.5–15.5)

## 2012-09-04 LAB — URINALYSIS, ROUTINE W REFLEX MICROSCOPIC
Glucose, UA: NEGATIVE mg/dL
Ketones, ur: NEGATIVE mg/dL
Nitrite: NEGATIVE
Protein, ur: NEGATIVE mg/dL
pH: 7 (ref 5.0–8.0)

## 2012-09-04 LAB — COMPREHENSIVE METABOLIC PANEL
AST: 25 U/L (ref 0–37)
Albumin: 3.5 g/dL (ref 3.5–5.2)
BUN: 17 mg/dL (ref 6–23)
Calcium: 9.4 mg/dL (ref 8.4–10.5)
Chloride: 101 mEq/L (ref 96–112)
Creatinine, Ser: 0.85 mg/dL (ref 0.50–1.10)
Total Bilirubin: 0.4 mg/dL (ref 0.3–1.2)

## 2012-09-04 LAB — LIPASE, BLOOD: Lipase: 17 U/L (ref 11–59)

## 2012-09-04 LAB — URINE MICROSCOPIC-ADD ON

## 2012-09-04 MED ORDER — ONDANSETRON HCL 4 MG/2ML IJ SOLN
4.0000 mg | Freq: Once | INTRAMUSCULAR | Status: AC
  Administered 2012-09-04: 4 mg via INTRAVENOUS
  Filled 2012-09-04: qty 2

## 2012-09-04 MED ORDER — CEPHALEXIN 500 MG PO CAPS: 500.0000 mg | ORAL_CAPSULE | Freq: Two times a day (BID) | ORAL | Status: DC

## 2012-09-04 MED ORDER — CEPHALEXIN 250 MG PO CAPS
500.0000 mg | ORAL_CAPSULE | Freq: Once | ORAL | Status: AC
  Administered 2012-09-04: 500 mg via ORAL
  Filled 2012-09-04: qty 2

## 2012-09-04 MED ORDER — OXYCODONE-ACETAMINOPHEN 5-325 MG PO TABS: 2.0000 | ORAL_TABLET | ORAL | Status: DC | PRN

## 2012-09-04 MED ORDER — HYDROMORPHONE HCL PF 1 MG/ML IJ SOLN
1.0000 mg | Freq: Once | INTRAMUSCULAR | Status: AC
  Administered 2012-09-04: 1 mg via INTRAVENOUS
  Filled 2012-09-04: qty 1

## 2012-09-04 MED ORDER — ONDANSETRON HCL 4 MG PO TABS: 4.0000 mg | ORAL_TABLET | Freq: Four times a day (QID) | ORAL | Status: DC

## 2012-09-04 NOTE — ED Notes (Signed)
Patient returned from Ultrasound. 

## 2012-09-04 NOTE — ED Notes (Signed)
Unable to place any orders due to Dr. Nelly Rout still accessing chart. Urine collected at triage.

## 2012-09-04 NOTE — Patient Instructions (Addendum)
Present to urgent care for assessment of RUQ tenderness Bactrim DS twice daily for 3 days

## 2012-09-04 NOTE — Progress Notes (Signed)
Follow Up Note: Gyn-Onc  [name] 47 y.o. female  CC: Endometrial cancer surveillance  Assessment/Plan: [name] is a 47 y.o. with Stage IA, grade 2 endometrial adenocarcinoma with no evidence of recurrence at this time.  She is to follow up with [name] in 6 months and Gynecologic Oncology in 1 year.  Significant weight gain over the past 2 years.    [name] presents with very symptomatic RUQ pain and nausea. Denies fever   She was advised to present to ER or ED.  Dysuria Urine culture obtained Bactrim DS RX     HPI:  [name] is a 47 y.o.  diagnosed with a DVT in March of 2009 with Coumadin initiated at that time.  Excessive uterine bleeding developed leading to a dilatation and curettage with hysteroscopy.  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  Due to her morbid obesity, a Mirena IUD was placed along with the addition of Aygestin.  After dramatic, directed weight loss and nutritional support, she went from 523 lbs on her initial visit to 359 lbs in July of 2011.  Her BMI was reduced to 50.  On December 27, 2009, she underwent a robotic-assisted laparoscopic hysterectomy, bilateral salpingo-oophorectomy, and mini-laparotomy through the umbilical port with morcellation of the uterus within a bag for delivery of the uterus. 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   [name] presents today for follow up.  Reporting last visit with Dr. Senaida Marquez in May 2013 with normal pap smear obtained at that time.  Weight has increased from 420 lbs at her last visit in October 2012 to 515 lbs at her nutrition visit  05/2012.  Last appointment with a nutritionist was in September 2013.  Reporting recent stress related to familial issues including her father having recent cardiac surgery.    Reports dysuria, hematuria and foul smelling urine.  Reports rectal bleeding in  Monday, with trace bloods on stools daily.  Reports nausea, no vomiting, epigastric pain, Reporting no dysuria or hematuria at that time with severe back pain as the presenting symptom.  Denies vaginal or rectal bleeding since her last visit.   Past Surgical Hx:  Past Surgical History  Procedure Laterality Date  . Ankle surgery  1950  . Wisdom tooth extraction    . Tonsillectomy and adenoidectomy    . Hysteroscopy w/d&c    . Abdominal hysterectomy  12/2009    RLH, BSO   Past Medical Hx:  Past Medical History  Diagnosis Date  . Uterine cancer   . Obesity   . DVT (deep venous thrombosis) 2009   Family Hx:  Family History  Problem Relation Age of Onset  . Heart disease Father   . Diabetes Brother    REVIEW OF SYSTEMS Constitutional  Feels uncomfortable.  Abdominal pain with deep breaths or stretching. Cardiovascular  No chest pain, shortness of breath, or edema  Pulmonary  No cough or wheeze.  Gastro Intestinal  No nausea, vomitting, or diarrhoea. No bright red blood per rectum, no abdominal pain, change in bowel movement, or constipation.  Genito Urinary  Reports frequency, foul smelling urine and dysuria.  Denies vaginal bleeding Musculo Skeletal  No myalgia, arthralgia, joint swelling or pain  Neurologic  No weakness, numbness, change in gait,  Psychology  No depression, anxiety, insomnia.    Vitals:  Blood pressure 100/60, pulse 74, temperature 98 F (36.7 C), temperature source Oral, resp.  rate 20, height 5\' 8"  (1.727 m), weight 511 lb (231.788 kg).  Physical Exam:  General:  Well developed, well nourished in no acute distress.  Alert and oriented x3. Neck:  Supple NROM, without any enlargements.  Lymph Node Survey:  No cervical supraclavicular or inguinal adenopathy.  Respiratory:  Lungs clear to auscultation.  No wheezes, crackles, or rhonchi noted. Cardiac:  Heart rate and rhythm regular, S1 and S2 normal.  No murmurs, clicks, or rubs noted. Abdomen:  Marked  right upper quadrant tenderness.  Abdominal guarding with touch. Umbilical surgical incision intact without evidence of hernia.  Periumbilical edema Back:  No CVA tenderness noted. Skin:  Skin between skin folds on the lower abdomen excoriated and erythematous due to moisture.  No rash noted. Pelvic:  Vulva:enlarged vulva.     Vagina: atrophic, limited examination because of obesity, no blood in the vault.    Extremities:  2-3+ bilateral edema.   Catherine Schimke, NP 09/04/2012, 4:01 PM

## 2012-09-04 NOTE — ED Notes (Signed)
Patient transported to Ultrasound 

## 2012-09-04 NOTE — ED Notes (Signed)
Pt reports abd bloating and pressure that started that on Saturday. Reports about 2 days ago she noted bright red blood in the toilet. States that she was seen by her cancer MD and was told to come over here. States hx of uterine cancer in 2011. Reports she has felt nauseated.

## 2012-09-04 NOTE — ED Notes (Signed)
C/o mid diffuse abd pain, nausea & bloating, has tried gas-x and alka seltzerLast ate 1230, last BM 0900, reports "rectal bleeding, but not with BM", for past 3 nights, last bleeding noted at 0600. H/o uterine CA, h/o hysterectomy, still has gall bladder.

## 2012-09-04 NOTE — ED Provider Notes (Signed)
History     CSN: [id number]  Arrival date & time 09/04/12  1612   First MD Initiated Contact with Patient 09/04/12 1802      Chief Complaint  Patient presents with  . Abdominal Pain  . Rectal Bleeding     HPI 47 year old female with a history of uterine cancer status post hysterectomy and history of DVTs not on anticoagulation currently presents complaining of abdominal pain and rectal bleeding.   About 2 days ago she noted bright red blood in the toilet after she urinated. She is not sure where the blood came from. However she thinks it came from her rectum. She states that she has urinated in a cup since then and her urine has been clear without blood. She also states that she went to her gynecological oncologist today to be seen about this problem and her pelvic exam was unrevealing with no source of bleeding. She's not had any blood streaked on her stool. She has not had any diarrhea. She does report that approximately one week ago she had severe pain with sitting and she had to shift around to try and find a comfortable position and the pain was in her anus. This lasted for a few days and resolved spontaneously. She has no known history of hemorrhoids. She has no history of Crohn's disease, ulcerative colitis.  As for her abdominal pain, she reports she has had pain, bloating, and a sensation of pressure in her upper abdomen for approximately 5-6 days. The pain is mainly epigastric and right upper quadrant. It has been intermittent. Seems to be worse after eating. It does not radiate. It is relieved by nothing. Severity is moderate. When seen by her gynecological oncologist today, the oncologist felt that her right upper quadrant was tender and she should have her gallbladder evaluated.   Past Medical History  Diagnosis Date  . Uterine cancer   . Obesity   . DVT (deep venous thrombosis) 2009    Past Surgical History  Procedure Laterality Date  . Ankle surgery  1950  . Wisdom  tooth extraction    . Tonsillectomy and adenoidectomy    . Hysteroscopy w/d&c    . Abdominal hysterectomy  12/2009    RLH, BSO    Family History  Problem Relation Age of Onset  . Heart disease Father   . Diabetes Brother     History  Substance Use Topics  . Smoking status: Never Smoker   . Smokeless tobacco: Not on file  . Alcohol Use: Yes     Comment: social, Occassionally    OB History   Grav Para Term Preterm Abortions TAB SAB Ect Mult Living                  Review of Systems  Constitutional: Negative for fever, chills and diaphoresis.  HENT: Negative for congestion, rhinorrhea, neck pain and neck stiffness.   Respiratory: Negative for cough, shortness of breath and wheezing.   Cardiovascular: Negative for chest pain and leg swelling.  Gastrointestinal: Positive for nausea, abdominal pain, constipation and anal bleeding. Negative for vomiting and diarrhea.  Genitourinary: Negative for dysuria, urgency, frequency, flank pain, vaginal bleeding, vaginal discharge and difficulty urinating.  Skin: Negative for rash.  Neurological: Negative for weakness, numbness and headaches.  All other systems reviewed and are negative.    Allergies  Review of patient's allergies indicates no known allergies.  Home Medications   Current Outpatient Rx  Name  Route  Sig  Dispense  Refill  . Cyanocobalamin (VITAMIN B-12 PO)   Oral   Take 1 tablet by mouth daily.         . folic acid (FOLVITE) 400 MCG tablet   Oral   Take 400 mcg by mouth daily.           . magnesium gluconate (MAGONATE) 500 MG tablet   Oral   Take 500 mg by mouth daily.         . Multiple Vitamins-Minerals (MULTIVITAMIN WITH MINERALS) tablet   Oral   Take 1 tablet by mouth daily.          . naproxen sodium (ANAPROX) 220 MG tablet   Oral   Take 880 mg by mouth daily.         Marland Kitchen triamterene-hydrochlorothiazide (MAXZIDE-25) 37.5-25 MG per tablet   Oral   Take 1 tablet by mouth daily.            . vitamin C (ASCORBIC ACID) 500 MG tablet   Oral   Take 500 mg by mouth daily.          . cephALEXin (KEFLEX) 500 MG capsule   Oral   Take 1 capsule (500 mg total) by mouth 2 (two) times daily.   14 capsule   0   . ondansetron (ZOFRAN) 4 MG tablet   Oral   Take 1 tablet (4 mg total) by mouth every 6 (six) hours.   20 tablet   0   . oxyCODONE-acetaminophen (PERCOCET/ROXICET) 5-325 MG per tablet   Oral   Take 2 tablets by mouth every 4 (four) hours as needed for pain.   15 tablet   0     BP 106/70  Pulse 79  Temp(Src) 97.8 F (36.6 C) (Oral)  Resp 18  SpO2 95%  Physical Exam  Nursing note and vitals reviewed. Constitutional: She is oriented to person, place, and time. She appears well-developed and well-nourished. No distress.  Morbidly obese  HENT:  Head: Normocephalic and atraumatic.  Mouth/Throat: Oropharynx is clear and moist.  Eyes: Conjunctivae and EOM are normal. Pupils are equal, round, and reactive to light. No scleral icterus.  Neck: Normal range of motion. Neck supple. No JVD present.  Cardiovascular: Normal rate, regular rhythm, normal heart sounds and intact distal pulses.  Exam reveals no gallop and no friction rub.   No murmur heard. Pulmonary/Chest: Effort normal and breath sounds normal. No respiratory distress. She has no wheezes. She has no rales.  Abdominal: Soft. Normal appearance and bowel sounds are normal. She exhibits no distension. There is tenderness in the right upper quadrant and epigastric area. There is no rigidity, no rebound, no guarding, no CVA tenderness, no tenderness at McBurney's point and negative Murphy's sign. No hernia.  Genitourinary: Rectal exam shows external hemorrhoid. Rectal exam shows no fissure, no mass and no tenderness. Guaiac positive stool.  Musculoskeletal: She exhibits no edema.  Neurological: She is alert and oriented to person, place, and time. No cranial nerve deficit. She exhibits normal muscle tone.  Coordination normal.  Skin: Skin is warm and dry. She is not diaphoretic.    ED Course  Procedures (including critical care time)  Labs Reviewed  COMPREHENSIVE METABOLIC PANEL - Abnormal; Notable for the following:    GFR calc non Af Amer 82 (*)    All other components within normal limits  URINALYSIS, ROUTINE W REFLEX MICROSCOPIC - Abnormal; Notable for the following:    APPearance HAZY (*)    Leukocytes, UA TRACE (*)  All other components within normal limits  URINE MICROSCOPIC-ADD ON - Abnormal; Notable for the following:    Squamous Epithelial / LPF FEW (*)    Bacteria, UA MANY (*)    Casts GRANULAR CAST (*)    All other components within normal limits  URINE CULTURE  CBC WITH DIFFERENTIAL  LIPASE, BLOOD  OCCULT BLOOD X 1 CARD TO LAB, STOOL   US Abdomen Complete  09/04/2012   *RADIOLOGY REPORT*  Clinical Data:  Right upper quadrant pain and rectal bleeding. Uterine cancer.  COMPLETE ABDOMINAL ULTRASOUND  Comparison:  02/11/2009 CT.  Findings:  Gallbladder:  Gravel within the gallbladder.  Pericholecystic fluid/ascites limits evaluation for detection of inflammation.  No gallbladder wall thickening.  The patient was not tender over this region as per ultrasound technologist appear  Common bile duct:  5.0 mm.  Liver:  Limited evaluation.  Fatty infiltration suspected.  Free fluid adjacent to liver  IVC:  Not visualized secondary to patient's habitus and bowel gas.  Pancreas:  Poorly delineated secondary to patient's habitus and bowel gas.  Spleen:  Poorly delineated secondary to patient's habitus and bowel gas.  Right Kidney:  Not visualized secondary to patient's habitus and bowel gas.  Left Kidney:  12.9 cm.  No hydronephrosis.  No obvious mass although evaluation limited.  Abdominal aorta:  Not visualized secondary to patient's habitus and bowel gas.  IMPRESSION: Gravel within the gallbladder.  Pericholecystic fluid/ascites limits evaluation for detection of inflammation.  No  gallbladder wall thickening.  The patient was not tender over this region as per ultrasound technologist appear  Fatty infiltration liver suspected.  Ascites.  Evaluation otherwise limited secondary to patient's habitus and bowel gas.  Please see above.   Original Report Authenticated By: Lacy Duverney, M.D.     1. Abdominal pain   2. UTI (lower urinary tract infection)   3. Gall stones   4. Hemorrhoid       MDM   47 year old female with a history of uterine cancer presents complaining of abdominal pain and rectal bleeding. Minimal rectal bleeding. She had swelling and pain in her anus about a week ago that has resolved. Today she also complains about 5-6 days of right upper quadrant and epigastric abdominal pain. She's had some nausea, no vomiting, no fever, chills, or systemic symptoms.  On exam she is afebrile with normal vitals, well-appearing, no acute distress, morbidly obese weighing greater than 500 pounds, she has tenderness to palpation in her right upper quadrant with negative Murphy sign.  she is also tender in the epigastrium. She has no other abdominal tenderness, no rebound, no guarding, negative Rovsing sign, no tenderness at McBurney's point. Her rectal exam demonstrates a non-thrombosed hemorrhoid, no fissures, no mass, she is faintly Hemoccult positive.  Her CMP is normal, urinalysis demonstrates a UTI, her CBC is unremarkable with no leukocytosis, lipase is normal.  RUQ quadrant ultrasound demonstrates gravel within the gallbladder. There may be pericholecystic fluid versus ascites. There is no wall thickening. Negative sonographic Murphy's. Exam was limited due to patient's body habitus.  I spoke to the surgeon on-call and informed him of the equivocal ultrasound; he advises that if she has no leukocytosis, no fever, her pain is well controlled she can be discharged and follow up with him in clinic this week. Patient's pain has been well-controlled with 1 dose of Dilaudid in  the emergency department. I discharged her with a prescription for Percocet and Zofran. Give her return precautions. Gave her instructions for treating  her hemorrhoids, which is likely the source of her rectal bleeding. Advised follow up primary care physician.         Toney Sang, MD 09/05/12 1054

## 2012-09-05 LAB — URINE CULTURE: Colony Count: 70000

## 2012-09-08 NOTE — ED Provider Notes (Signed)
I reviewed the resident's note and I agree with the findings and plan.   Celene Kras, MD 09/08/12 [id number]

## 2012-09-09 ENCOUNTER — Encounter (HOSPITAL_COMMUNITY): Payer: Self-pay | Admitting: Emergency Medicine

## 2012-09-09 ENCOUNTER — Emergency Department (HOSPITAL_COMMUNITY)
Admission: EM | Admit: 2012-09-09 | Discharge: 2012-09-09 | Disposition: A | Discharge status: Home / Self Care | Payer: BC Managed Care – PPO | Attending: Emergency Medicine | Admitting: Emergency Medicine

## 2012-09-09 ENCOUNTER — Telehealth (INDEPENDENT_AMBULATORY_CARE_PROVIDER_SITE_OTHER): Payer: Self-pay

## 2012-09-09 DIAGNOSIS — Z86718 Personal history of other venous thrombosis and embolism: Secondary | ICD-10-CM | POA: Insufficient documentation

## 2012-09-09 DIAGNOSIS — Z79899 Other long term (current) drug therapy: Secondary | ICD-10-CM | POA: Insufficient documentation

## 2012-09-09 DIAGNOSIS — K5900 Constipation, unspecified: Secondary | ICD-10-CM

## 2012-09-09 DIAGNOSIS — R1011 Right upper quadrant pain: Secondary | ICD-10-CM | POA: Insufficient documentation

## 2012-09-09 DIAGNOSIS — K8020 Calculus of gallbladder without cholecystitis without obstruction: Secondary | ICD-10-CM

## 2012-09-09 DIAGNOSIS — Z8542 Personal history of malignant neoplasm of other parts of uterus: Secondary | ICD-10-CM | POA: Insufficient documentation

## 2012-09-09 DIAGNOSIS — E669 Obesity, unspecified: Secondary | ICD-10-CM | POA: Insufficient documentation

## 2012-09-09 LAB — COMPREHENSIVE METABOLIC PANEL
BUN: 14 mg/dL (ref 6–23)
CO2: 29 mEq/L (ref 19–32)
Calcium: 9.1 mg/dL (ref 8.4–10.5)
GFR calc Af Amer: 89 mL/min — ABNORMAL LOW (ref >90)
GFR calc non Af Amer: 77 mL/min — ABNORMAL LOW (ref >90)
Glucose, Bld: 94 mg/dL (ref 70–99)
Total Protein: 6.9 g/dL (ref 6.0–8.3)

## 2012-09-09 LAB — CBC WITH DIFFERENTIAL/PLATELET
Eosinophils Absolute: 0.2 10*3/uL (ref 0.0–0.7)
Eosinophils Relative: 2 % (ref 0–5)
HCT: 38.4 % (ref 36.0–46.0)
Hemoglobin: 12.7 g/dL (ref 12.0–15.0)
Lymphs Abs: 0.9 10*3/uL (ref 0.7–4.0)
MCH: 28.9 pg (ref 26.0–34.0)
MCV: 87.5 fL (ref 78.0–100.0)
Monocytes Absolute: 0.6 10*3/uL (ref 0.1–1.0)
Monocytes Relative: 8 % (ref 3–12)
RBC: 4.39 MIL/uL (ref 3.87–5.11)

## 2012-09-09 LAB — LIPASE, BLOOD: Lipase: 13 U/L (ref 11–59)

## 2012-09-09 MED ORDER — MAGNESIUM HYDROXIDE 400 MG/5ML PO SUSP: 30.0000 mL | Freq: Every day | ORAL | Status: DC | PRN

## 2012-09-09 MED ORDER — ONDANSETRON HCL 4 MG/2ML IJ SOLN
4.0000 mg | Freq: Once | INTRAMUSCULAR | Status: AC
  Administered 2012-09-09: 4 mg via INTRAVENOUS
  Filled 2012-09-09: qty 2

## 2012-09-09 MED ORDER — TRAMADOL HCL 50 MG PO TABS: 50.0000 mg | ORAL_TABLET | Freq: Four times a day (QID) | ORAL | Status: DC | PRN

## 2012-09-09 MED ORDER — MORPHINE SULFATE 4 MG/ML IJ SOLN
4.0000 mg | Freq: Once | INTRAMUSCULAR | Status: AC
  Administered 2012-09-09: 4 mg via INTRAVENOUS
  Filled 2012-09-09: qty 1

## 2012-09-09 NOTE — Telephone Encounter (Signed)
Pt called stating she is having severe lower abd pain due to constipation. Pt has been seen in ED and referred to [place] for eval GB on Friday. No vomiting and until this am has been eating a normal diet.  Pt advised she needs to return to ED if she is having severe abd pain due to constipation. Pt states she understands.

## 2012-09-09 NOTE — Consult Note (Signed)
Reason for Consult:BLOATING, ABDOMINAL DISCOMFORT, since May 10. Referring Physician: Emilia Beck, PA-C (ED) [name], MD Primary Care   [name] is an 47 y.o. female.  HPI: Pt is a 47 y/o who started complaining of pain on 08/30/12.  She called her primary care and was referred to her OB Gyn because of possible blood from her urine.  She was seen and this was ruled out.  She presented to the ER 09/04/12 with the current complaints and treated for a UTI.  She was place on antiemetics, pain meds, and an antibiotic.  Abd [place] was also obtained which showed some gravel in the GB, some pericholecystic fluid and ascites.  No GB wall thickening, pt was not tender over the GB on their exam.  Since d/c she continues to have bloating, some nausea on and off, not really related to PO intake.  She has some generalized abdominal discomfort and points to both her left and right side when ask where the pain is.  Her diet seems to vary from soup to Pimento cheese and Macaroni and cheese.  Pain is not directly associated with eating.  Nausea comes and goes, sometimes in the AM before she eats, and sometimes later in the day after she eats. She has not had any vomiting with her symptoms. She is scheduled to follow up with our office in 2 days for Outpatient evaluation, but says she feels worse.  She is also constipated, and has take stool softeners, but has not had a BM for 6 days.    She says she cannot bend over to put on her compression stockings because of her discomfort.  She has labs from both hospital visits. WBC is normal for both visits, LFT'S are also normal, as is her lipase.  She remains a little more tender in the RUQ than the LUQ.  We were ask to see her by the ER staff.   Past Medical History  Diagnosis Date  . Uterine cancer with hysterectomy   . Obesity  BMI 71.5 on 05/28/12   . DVT (deep venous thrombosis) 2009    Past Surgical History  Procedure Laterality Date  . Ankle surgery  1950   . Wisdom tooth extraction    . Tonsillectomy and adenoidectomy    . Hysteroscopy w/d&c    . Abdominal hysterectomy  12/2009    [facility], BSO    Family History  Problem Relation Age of Onset  . Heart disease Father   . Diabetes Brother     Social History:  reports that she has never smoked. She does not have any smokeless tobacco history on file. She reports that  drinks alcohol. She reports that she does not use illicit drugs. Occasional ETOH.  Allergies: No Known Allergies  Medications:  Prior to Admission medications   Medication Sig Start Date End Date Taking? Authorizing Provider  Ascorbic Acid (VITAMIN C) 1000 MG tablet Take 1,000 mg by mouth daily.   Yes Historical Provider, MD  cephALEXin (KEFLEX) 500 MG capsule Take 1 capsule (500 mg total) by mouth 2 (two) times daily. 09/04/12  Yes Toney Sang, MD  Cyanocobalamin (VITAMIN B-12 PO) Take 2,500 mg by mouth daily.    Yes Historical Provider, MD  folic acid (FOLVITE) 400 MCG tablet Take 400 mcg by mouth daily.     Yes Historical Provider, MD  magnesium gluconate (MAGONATE) 500 MG tablet Take 500 mg by mouth daily.   Yes Historical Provider, MD  Multiple Vitamins-Minerals (MULTIVITAMIN WITH MINERALS)  tablet Take 1 tablet by mouth daily.    Yes Historical Provider, MD  naproxen sodium (ANAPROX) 220 MG tablet Take 880 mg by mouth daily.   Yes Historical Provider, MD  ondansetron (ZOFRAN) 4 MG tablet Take 1 tablet (4 mg total) by mouth every 6 (six) hours. 09/04/12  Yes Toney Sang, MD  oxyCODONE-acetaminophen (PERCOCET/ROXICET) 5-325 MG per tablet Take 2 tablets by mouth every 4 (four) hours as needed for pain. 09/04/12  Yes Toney Sang, MD  triamterene-hydrochlorothiazide (MAXZIDE-25) 37.5-25 MG per tablet Take 1 tablet by mouth daily.     Yes Historical Provider, MD She says this is for fluid in her lower legs    Prior to Admission:  (Not in a hospital admission) Scheduled: Continuous: PRN:   Anti-infectives   None       Results for orders placed during the hospital encounter of 09/09/12 (from the past 48 hour(s))  CBC WITH DIFFERENTIAL     Status: Abnormal   Collection Time    09/09/12  1:15 PM      Result Value Range   WBC 7.7  4.0 - 10.5 K/uL   RBC 4.39  3.87 - 5.11 MIL/uL   Hemoglobin 12.7  12.0 - 15.0 g/dL   HCT 40.9  81.1 - 91.4 %   MCV 87.5  78.0 - 100.0 fL   MCH 28.9  26.0 - 34.0 pg   MCHC 33.1  30.0 - 36.0 g/dL   RDW 78.2  95.6 - 21.3 %   Platelets 274  150 - 400 K/uL   Neutrophils Relative % 78 (*) 43 - 77 %   Neutro Abs 6.0  1.7 - 7.7 K/uL   Lymphocytes Relative 12  12 - 46 %   Lymphs Abs 0.9  0.7 - 4.0 K/uL   Monocytes Relative 8  3 - 12 %   Monocytes Absolute 0.6  0.1 - 1.0 K/uL   Eosinophils Relative 2  0 - 5 %   Eosinophils Absolute 0.2  0.0 - 0.7 K/uL   Basophils Relative 0  0 - 1 %   Basophils Absolute 0.0  0.0 - 0.1 K/uL  COMPREHENSIVE METABOLIC PANEL     Status: Abnormal   Collection Time    09/09/12  1:15 PM      Result Value Range   Sodium 136  135 - 145 mEq/L   Potassium 3.9  3.5 - 5.1 mEq/L   Chloride 96  96 - 112 mEq/L   CO2 29  19 - 32 mEq/L   Glucose, Bld 94  70 - 99 mg/dL   BUN 14  6 - 23 mg/dL   Creatinine, Ser 0.86  0.50 - 1.10 mg/dL   Calcium 9.1  8.4 - 57.8 mg/dL   Total Protein 6.9  6.0 - 8.3 g/dL   Albumin 2.9 (*) 3.5 - 5.2 g/dL   AST 25  0 - 37 U/L   ALT 17  0 - 35 U/L   Alkaline Phosphatase 55  39 - 117 U/L   Total Bilirubin 0.4  0.3 - 1.2 mg/dL   GFR calc non Af Amer 77 (*) >90 mL/min   GFR calc Af Amer 89 (*) >90 mL/min   Comment:            The eGFR has been calculated     using the CKD EPI equation.     This calculation has not been     validated in all clinical     situations.  eGFR's persistently     <90 mL/min signify     possible Chronic Kidney Disease.  LIPASE, BLOOD     Status: None   Collection Time    09/09/12  1:15 PM      Result Value Range   Lipase 13  11 - 59 U/L    No results found.  Review of Systems   Constitutional: Negative for fever, chills, weight loss, malaise/fatigue and diaphoresis.       Morbidly obese, last weight was in Feb 2014 (511 pounds) She is not aware of any weight loss.  HENT: Negative.   Respiratory: Positive for shortness of breath (She says she has some DOE with her current abdominal pain.). Negative for wheezing.   Cardiovascular: Negative.   Gastrointestinal: Positive for nausea (intermittent nausea, not related to PO intake.), abdominal pain (diffuse abdominal discomfort and bloating, can't get comfortable.), constipation (Last BM 6 days ago.  Started after pain pills she got in ER last week) and blood in stool (She has some hx of hemrrhoids, had blood in toilet last week). Negative for heartburn, vomiting and diarrhea.       Says she cannot bed over to put on compression stockings with her abdomen hurting like this.  Genitourinary: Negative.        She had some odor last week, but that has resolved with rx. Over the last week.  Musculoskeletal: Positive for back pain (on and off.).  Skin: Negative.   Neurological: Negative.  Negative for weakness.  Endo/Heme/Allergies: Negative.   Psychiatric/Behavioral: Negative.    Blood pressure 104/70, pulse 84, temperature 98 F (36.7 C), temperature source Oral, resp. rate 24, SpO2 96.00%. Physical Exam  Constitutional: She is oriented to person, place, and time.  Morbidly obese WF NAD  HENT:  Head: Normocephalic and atraumatic.  Nose: Nose normal.  Eyes: Conjunctivae and EOM are normal. Pupils are equal, round, and reactive to light. Right eye exhibits no discharge. Left eye exhibits no discharge. No scleral icterus.  Neck: No JVD present. No tracheal deviation present. No thyromegaly present.  Cardiovascular: Normal rate, regular rhythm, normal heart sounds and intact distal pulses.  Exam reveals no gallop.   No murmur heard. Respiratory: Effort normal and breath sounds normal. No respiratory distress. She has no  wheezes. She has no rales. She exhibits no tenderness.  GI: Soft. Bowel sounds are normal. She exhibits no distension and no mass. There is tenderness (Moderate tenderness in the RUQ, some in the LUQ.). There is no rebound and no guarding.  Musculoskeletal: She exhibits no edema and no tenderness.  Lymphadenopathy:    She has no cervical adenopathy.  Neurological: She is alert and oriented to person, place, and time. No cranial nerve deficit.  Skin: Skin is warm and dry. No rash noted. No erythema. No pallor.  Psychiatric: She has a normal mood and affect. Her behavior is normal. Judgment and thought content normal.    Assessment/Plan: 1. Abdominal discomfort and some intermittent nausea. 2. Cholelithiasis on Abdominal ultrasound/normal WBC/normal LFT's/normal lipase 3.Constipation 4. Recent treatment for UTI 5. BMI 71 6. Hx of uterine CA with prior hysterectomy 7.Prior hx of DVT  Plan:  I do not see any indication of acute cholecystitis, and I cannot tell her her symptoms are clearly from her gallstones/gallbladder. We have recommended she maintain a low fat diet, treat her for her constipation, and return on Friday for her scheduled appointment with our office.  With her BMI other confirmatory test will be difficult  to do. Dr. Antonieta Pert has seen her and we have recommended MOM and if that fails, magnesium Citrate.  Then follow up with our office on Friday.  [name] 09/09/2012, 4:17 PM

## 2012-09-09 NOTE — ED Notes (Signed)
Was in here for same last Thursday dx gall stones/ UTI and was  Told to go to surgeon cannot see them till Friday but abd pain  Rt upper pain was told to come here for pain meds. Is now constipated also

## 2012-09-09 NOTE — ED Notes (Signed)
Family at bedside. 

## 2012-09-09 NOTE — ED Provider Notes (Signed)
Pt was signed out to me by Emilia Beck PA.  Pt is to be evaluated by general surgery.  Advised that pain is due to cholecystitis but also likely due to constipation.  Pt has not had BM in 6 days.  States she does not want to try a laxative because she is afraid she cannot get to toilet in time.  Pt has appointment with general surgery on Fri 5/23.  States that percocet has been helping but just causes her to fall asleep.  Otherwise she is in severe pain and unable to get comfortable in chair at home even after using multiple pillows for support. General surgery spoke with pt, advised her to f/u as scheduled in office on Friday 5/23.  Rx: milk of mag and tramadol.  Discussed with pt tramadol was less likely to compound constipation.  Pt agreed. Vitals: unremarkable. Discharged home in stable condition.    Discussed pt with attending during ED encounter.   Junius Finner, PA-C 09/10/12 1417

## 2012-09-09 NOTE — ED Provider Notes (Signed)
History     CSN: [id number]  Arrival date & time 09/09/12  1113   First MD Initiated Contact with Patient 09/09/12 1229      Chief Complaint  Patient presents with  . Abdominal Pain    (Consider location/radiation/quality/duration/timing/severity/associated sxs/prior treatment) HPI Comments: Patient is a 47 year old female who was recently diagnosed with gallstones and a UTI 5 days ago and continues to have RUQ pain. The pain is located in the RUQ and does not radiate. The pain is described as aching and severe. The pain started gradually and progressively worsened since the onset. No alleviating/aggravating factors. The patient has tried Percocet for symptoms with some relief. Associated symptoms include constipation. Patient denies fever, headache, NVD, chest pain, SOB, dysuria, abnormal vaginal bleeding/discharge.     Patient is a 47 y.o. female presenting with abdominal pain.  Abdominal Pain Associated symptoms include abdominal pain.    Past Medical History  Diagnosis Date  . Uterine cancer   . Obesity   . DVT (deep venous thrombosis) 2009    Past Surgical History  Procedure Laterality Date  . Ankle surgery  1950  . Wisdom tooth extraction    . Tonsillectomy and adenoidectomy    . Hysteroscopy w/d&c    . Abdominal hysterectomy  12/2009    RLH, BSO    Family History  Problem Relation Age of Onset  . Heart disease Father   . Diabetes Brother     History  Substance Use Topics  . Smoking status: Never Smoker   . Smokeless tobacco: Not on file  . Alcohol Use: Yes     Comment: social, Occassionally    OB History   Grav Para Term Preterm Abortions TAB SAB Ect Mult Living                  Review of Systems  Gastrointestinal: Positive for abdominal pain.  All other systems reviewed and are negative.    Allergies  Review of patient's allergies indicates no known allergies.  Home Medications   Current Outpatient Rx  Name  Route  Sig  Dispense  Refill   . Ascorbic Acid (VITAMIN C) 1000 MG tablet   Oral   Take 1,000 mg by mouth daily.         . cephALEXin (KEFLEX) 500 MG capsule   Oral   Take 1 capsule (500 mg total) by mouth 2 (two) times daily.   14 capsule   0   . Cyanocobalamin (VITAMIN B-12 PO)   Oral   Take 2,500 mg by mouth daily.          . folic acid (FOLVITE) 400 MCG tablet   Oral   Take 400 mcg by mouth daily.           . magnesium gluconate (MAGONATE) 500 MG tablet   Oral   Take 500 mg by mouth daily.         . Multiple Vitamins-Minerals (MULTIVITAMIN WITH MINERALS) tablet   Oral   Take 1 tablet by mouth daily.          . naproxen sodium (ANAPROX) 220 MG tablet   Oral   Take 880 mg by mouth daily.         . ondansetron (ZOFRAN) 4 MG tablet   Oral   Take 1 tablet (4 mg total) by mouth every 6 (six) hours.   20 tablet   0   . oxyCODONE-acetaminophen (PERCOCET/ROXICET) 5-325 MG per tablet   Oral  Take 2 tablets by mouth every 4 (four) hours as needed for pain.   15 tablet   0   . triamterene-hydrochlorothiazide (MAXZIDE-25) 37.5-25 MG per tablet   Oral   Take 1 tablet by mouth daily.             BP 104/70  Pulse 84  Temp(Src) 98 F (36.7 C) (Oral)  Resp 24  SpO2 96%  Physical Exam  Nursing note and vitals reviewed. Constitutional: She is oriented to person, place, and time. She appears well-developed and well-nourished. No distress.  HENT:  Head: Normocephalic and atraumatic.  Eyes: Conjunctivae and EOM are normal. Pupils are equal, round, and reactive to light. No scleral icterus.  Neck: Normal range of motion.  Cardiovascular: Normal rate and regular rhythm.  Exam reveals no gallop and no friction rub.   No murmur heard. Pulmonary/Chest: Effort normal and breath sounds normal. She has no wheezes. She has no rales. She exhibits no tenderness.  Abdominal: Soft. She exhibits no distension. There is tenderness. There is no rebound and no guarding.  Obese abdomen with RUQ  tenderness to palpation. No peritoneal signs.   Musculoskeletal: Normal range of motion.  Neurological: She is alert and oriented to person, place, and time. Coordination normal.  Speech is goal-oriented. Moves limbs without ataxia.   Skin: Skin is warm and dry.  Psychiatric: She has a normal mood and affect. Her behavior is normal.    ED Course  Procedures (including critical care time)  Labs Reviewed  CBC WITH DIFFERENTIAL - Abnormal; Notable for the following:    Neutrophils Relative % 78 (*)    All other components within normal limits  COMPREHENSIVE METABOLIC PANEL - Abnormal; Notable for the following:    Albumin 2.9 (*)    GFR calc non Af Amer 77 (*)    GFR calc Af Amer 89 (*)    All other components within normal limits  LIPASE, BLOOD   No results found.   1. RUQ abdominal pain   2. Constipation       MDM  1:14 PM Labs pending. Patient will have morphine for pain. Vitals stable and patient afebrile.   3:17 PM Labs unremarkable. General surgery will see the patient.   Patient signed out to Margarita Rana, PA-C.   Emilia Beck, PA-C 09/11/12 1235

## 2012-09-10 NOTE — Consult Note (Signed)
I have seen and examined the pt.  I agree with PA-Jenning's PN. No objective signs of acute cholecystitis.  Pt with 6d h/o constipation and minimal BM. Rec to take a cathartic to help with BM.   F/u in clinic as scheduled.

## 2012-09-11 NOTE — ED Provider Notes (Signed)
Medical screening examination/treatment/procedure(s) were performed by non-physician practitioner and as supervising physician I was immediately available for consultation/collaboration.   Dione Booze, MD 09/11/12 [phone]

## 2012-09-12 ENCOUNTER — Ambulatory Visit (INDEPENDENT_AMBULATORY_CARE_PROVIDER_SITE_OTHER): Payer: Self-pay | Admitting: Surgery

## 2012-09-12 ENCOUNTER — Encounter (INDEPENDENT_AMBULATORY_CARE_PROVIDER_SITE_OTHER): Payer: Self-pay | Admitting: Surgery

## 2012-09-12 VITALS — BP 112/70 | HR 84 | Temp 97.8°F | Resp 24 | Ht 71.0 in | Wt >= 6400 oz

## 2012-09-12 DIAGNOSIS — K8010 Calculus of gallbladder with chronic cholecystitis without obstruction: Secondary | ICD-10-CM | POA: Insufficient documentation

## 2012-09-12 NOTE — Progress Notes (Signed)
Patient ID: [name], female   DOB: 08/22/1965, 47 y.o.   MRN: [id number]  Chief Complaint  Patient presents with  . Cholelithiasis    new pt- eval gallbladder  w/ stones    HPI [name] is a 47 y.o. female.  Referred by [facility] for gallbladder disease - Dr. Jon Knapp PCP - Dr. Stephen Myers HPI This is a 47-year-old female who presents with recent onset of severe epigastric and right upper quadrant pain that radiates through to her back. This is associated with significant abdominal bloating as well as nausea. She denies any diarrhea. The patient has had 2 emergency department visits for this problem in the last 2 weeks. She was diagnosed with multiple gallstones but no clear evidence of cholecystitis. The ultrasound study was limited by her body habitus.  The patient has had significant weight gain over the last couple of years. In searching back through her recent notes, in 2012 She weighed 420 pounds. In February of 2014 she weighed 5:15. Today she weighs 570 with a BMI of 79. She claims that she is following the instructions of her nutritionist. She also claims that her appetite is limited by her nausea. Past Medical History  Diagnosis Date  . Uterine cancer   . Obesity   . DVT (deep venous thrombosis) 2009    Past Surgical History  Procedure Laterality Date  . Ankle surgery  1950  . Wisdom tooth extraction    . Tonsillectomy and adenoidectomy    . Hysteroscopy w/d&c    . Abdominal hysterectomy  12/2009    RLH, BSO    Family History  Problem Relation Age of Onset  . Heart disease Father   . Diabetes Brother   . Heart disease Mother     Social History History  Substance Use Topics  . Smoking status: Never Smoker   . Smokeless tobacco: Not on file  . Alcohol Use: Yes     Comment: social, Occassionally    No Known Allergies  Current Outpatient Prescriptions  Medication Sig Dispense Refill  . Ascorbic Acid (VITAMIN C) 1000 MG tablet Take 1,000 mg by mouth  daily.      . Cyanocobalamin (VITAMIN B-12 PO) Take 2,500 mg by mouth daily.       . folic acid (FOLVITE) 400 MCG tablet Take 400 mcg by mouth daily.        . magnesium gluconate (MAGONATE) 500 MG tablet Take 500 mg by mouth daily.      . magnesium hydroxide (MILK OF MAGNESIA) 400 MG/5ML suspension Take 30 mLs by mouth daily as needed for constipation.  360 mL  0  . Multiple Vitamins-Minerals (MULTIVITAMIN WITH MINERALS) tablet Take 1 tablet by mouth daily.       . naproxen sodium (ANAPROX) 220 MG tablet Take 880 mg by mouth daily.      . ondansetron (ZOFRAN) 4 MG tablet Take 1 tablet (4 mg total) by mouth every 6 (six) hours.  20 tablet  0  . oxyCODONE-acetaminophen (PERCOCET/ROXICET) 5-325 MG per tablet Take 2 tablets by mouth every 4 (four) hours as needed for pain.  15 tablet  0  . traMADol (ULTRAM) 50 MG tablet Take 1 tablet (50 mg total) by mouth every 6 (six) hours as needed for pain.  15 tablet  0  . triamterene-hydrochlorothiazide (MAXZIDE-25) 37.5-25 MG per tablet Take 1 tablet by mouth daily.         No current facility-administered medications for this visit.      Review of Systems Review of Systems  Constitutional: Negative for fever, chills and unexpected weight change.  HENT: Negative for hearing loss, congestion, sore throat, trouble swallowing and voice change.   Eyes: Negative for visual disturbance.  Respiratory: Negative for cough and wheezing.   Cardiovascular: Negative for chest pain, palpitations and leg swelling.  Gastrointestinal: Positive for nausea, abdominal pain and abdominal distention. Negative for vomiting, diarrhea, constipation, blood in stool and anal bleeding.  Genitourinary: Negative for hematuria, vaginal bleeding and difficulty urinating.  Musculoskeletal: Negative for arthralgias.  Skin: Negative for rash and wound.  Neurological: Negative for seizures, syncope and headaches.  Hematological: Negative for adenopathy. Does not bruise/bleed easily.   Psychiatric/Behavioral: Negative for confusion.    Blood pressure 112/70, pulse 84, temperature 97.8 F (36.6 C), temperature source Temporal, resp. rate 24, height 5' 11" (1.803 m), weight 569 lb 6.4 oz (258.278 kg). Body mass index is 79.45 kg/(m^2).  Physical Exam Physical Exam Morbidly obese female in NAD HEENT:  EOMI, sclera anicteric Neck:  No masses, no thyromegaly Lungs:  CTA bilaterally; normal respiratory effort CV:  Regular rate and rhythm; no murmurs Abd:  +bowel sounds, massive obese with enormous pannus - no active panniculitis Tender in RUQ and epigastrium; no tenderness in LUQ No palpable masses. Ext:  Well-perfused; no edema Skin:  Warm, dry; no sign of jaundice  Data Reviewed Lab Results  Component Value Date   WBC 7.7 09/09/2012   HGB 12.7 09/09/2012   HCT 38.4 09/09/2012   MCV 87.5 09/09/2012   PLT 274 09/09/2012   Lab Results  Component Value Date   CREATININE 0.89 09/09/2012   BUN 14 09/09/2012   NA 136 09/09/2012   K 3.9 09/09/2012   CL 96 09/09/2012   CO2 29 09/09/2012   Lab Results  Component Value Date   ALT 17 09/09/2012   AST 25 09/09/2012   ALKPHOS 55 09/09/2012   BILITOT 0.4 09/09/2012   Lab Results  Component Value Date   LIPASE 13 09/09/2012   RADIOLOGY REPORT*  Clinical Data: Right upper quadrant pain and rectal bleeding.  Uterine cancer.  COMPLETE ABDOMINAL ULTRASOUND  Comparison: 02/11/2009 CT.  Findings:  Gallbladder: Gravel within the gallbladder. Pericholecystic  fluid/ascites limits evaluation for detection of inflammation. No  gallbladder wall thickening. The patient was not tender over this  region as per ultrasound technologist appear  Common bile duct: 5.0 mm.  Liver: Limited evaluation. Fatty infiltration suspected. Free  fluid adjacent to liver  IVC: Not visualized secondary to patient's habitus and bowel gas.  Pancreas: Poorly delineated secondary to patient's habitus and  bowel gas.  Spleen: Poorly delineated secondary  to patient's habitus and bowel  gas.  Right Kidney: Not visualized secondary to patient's habitus and  bowel gas.  Left Kidney: 12.9 cm. No hydronephrosis. No obvious mass  although evaluation limited.  Abdominal aorta: Not visualized secondary to patient's habitus and  bowel gas.  IMPRESSION:  Gravel within the gallbladder. Pericholecystic fluid/ascites  limits evaluation for detection of inflammation. No gallbladder  wall thickening. The patient was not tender over this region as  per ultrasound technologist appear  Fatty infiltration liver suspected.  Ascites.  Evaluation otherwise limited secondary to patient's habitus and  bowel gas.  Please see above.  Original Report Authenticated By: Steven Olson, M.D.   Assessment    1.  Chronic calculus cholecystitis 2.  Supermorbid obesity with ongoing rapid weight gain    Plan    Laparoscopic cholecystectomy with intraoperative cholangiogram. Her   surgery will obviously be complicated by her massive abdominal girth.  I discussed the procedure in detail.  The patient was given educational material.  We discussed the risks and benefits of a laparoscopic cholecystectomy and possible cholangiogram including, but not limited to bleeding, infection, injury to surrounding structures such as the intestine or liver, bile leak, retained gallstones, need to convert to an open procedure, prolonged diarrhea, blood clots such as  DVT, common bile duct injury, anesthesia risks, and possible need for additional procedures.  The likelihood of improvement in symptoms and return to the patient's normal status is good. We discussed the typical post-operative recovery course         Cinsere Mizrahi K. 09/12/2012, 10:28 AM    

## 2012-09-16 ENCOUNTER — Encounter (HOSPITAL_COMMUNITY): Payer: Self-pay | Admitting: Pharmacy Technician

## 2012-09-16 NOTE — ED Provider Notes (Signed)
Medical screening examination/treatment/procedure(s) were conducted as a shared visit with non-physician practitioner(s) and myself.  I personally evaluated the patient during the encounter Pt with ruq pain, recent dx gallstones, on that u/s ?fluid around gb, ?cholecystitis. Labs. gen surg to see.   Suzi Roots, MD 09/16/12 [phone]

## 2012-09-19 ENCOUNTER — Telehealth (INDEPENDENT_AMBULATORY_CARE_PROVIDER_SITE_OTHER): Payer: Self-pay | Admitting: General Surgery

## 2012-09-19 NOTE — Telephone Encounter (Signed)
Pt's mother called for daughter, to request additional pain and nausea medicine.  These were given to her at the Emergency room.  Advised them to contact pt's PCP for meds prior to surgery.  She understands.

## 2012-09-24 ENCOUNTER — Encounter (HOSPITAL_COMMUNITY): Payer: Self-pay

## 2012-09-24 ENCOUNTER — Encounter (HOSPITAL_COMMUNITY)
Admission: RE | Admit: 2012-09-24 | Discharge: 2012-09-24 | Disposition: A | Discharge status: Home / Self Care | Payer: BC Managed Care – PPO | Source: Ambulatory Visit | Attending: Surgery | Admitting: Surgery

## 2012-09-24 DIAGNOSIS — Z01810 Encounter for preprocedural cardiovascular examination: Secondary | ICD-10-CM | POA: Insufficient documentation

## 2012-09-24 DIAGNOSIS — Z01812 Encounter for preprocedural laboratory examination: Secondary | ICD-10-CM | POA: Insufficient documentation

## 2012-09-24 HISTORY — DX: Other specified postprocedural states: Z98.890

## 2012-09-24 HISTORY — DX: Other specified postprocedural states: R11.2

## 2012-09-24 HISTORY — DX: Calculus of gallbladder without cholecystitis without obstruction: K80.20

## 2012-09-24 LAB — BASIC METABOLIC PANEL
BUN: 11 mg/dL (ref 6–23)
GFR calc Af Amer: 78 mL/min — ABNORMAL LOW (ref >90)
GFR calc non Af Amer: 67 mL/min — ABNORMAL LOW (ref >90)
Potassium: 4.2 mEq/L (ref 3.5–5.1)
Sodium: 137 mEq/L (ref 135–145)

## 2012-09-24 LAB — CBC
MCHC: 31.6 g/dL (ref 30.0–36.0)
RDW: 14 % (ref 11.5–15.5)

## 2012-09-24 LAB — SURGICAL PCR SCREEN
MRSA, PCR: NEGATIVE
Staphylococcus aureus: NEGATIVE

## 2012-09-24 NOTE — Patient Instructions (Signed)
[name]  09/24/2012                           YOUR PROCEDURE IS SCHEDULED ON: 10/02/12               PLEASE REPORT TO SHORT STAY CENTER AT : 7:00 AM               CALL THIS NUMBER IF ANY PROBLEMS THE DAY OF SURGERY :               [phone]                      REMEMBER:   Do not eat food or drink liquids AFTER MIDNIGHT   Take these medicines the morning of surgery with A SIP OF WATER: ZOFRAN / HYDROCODONE IF NEEDED   Do not wear jewelry, make-up   Do not wear lotions, powders, or perfumes.   Do not shave legs or underarms 12 hrs. before surgery (men may shave face)  Do not bring valuables to the hospital.  Contacts, dentures or bridgework may not be worn into surgery.  Leave suitcase in the car. After surgery it may be brought to your room.  For patients admitted to the hospital more than one night, checkout time is 11:00                          The day of discharge.   Patients discharged the day of surgery will not be allowed to drive home                             If going home same day of surgery, must have someone stay with you first                           24 hrs at home and arrange for some one to drive you home from hospital.    Special Instructions:   Please read over the following fact sheets that you were given:               1. MRSA  INFORMATION                      2. [place] PREPARING FOR SURGERY SHEET               3. STOP ASPIRIN / HERBAL MEDICATION 5 DAYS PREOP                                                X_____________________________________________________________________        Failure to follow these instructions may result in cancellation of your surgery

## 2012-09-26 ENCOUNTER — Telehealth (INDEPENDENT_AMBULATORY_CARE_PROVIDER_SITE_OTHER): Payer: Self-pay | Admitting: *Deleted

## 2012-09-26 NOTE — Telephone Encounter (Signed)
Received a call from Darlene in Pre-admit who requested an order for a bariatric bed for this patient.  This RN spoke to Dr. Corliss Skains who did approve for a bariatric bed and a verbal order was given to Darlene to place the order per Dr. Corliss Skains.

## 2012-10-02 ENCOUNTER — Inpatient Hospital Stay (HOSPITAL_COMMUNITY)
Admission: RE | Admit: 2012-10-02 | Discharge: 2012-10-04 | DRG: 170 | Disposition: A | Discharge status: Home / Self Care | Payer: BC Managed Care – PPO | Source: Ambulatory Visit | Attending: Surgery | Admitting: Surgery

## 2012-10-02 ENCOUNTER — Encounter (HOSPITAL_COMMUNITY): Payer: Self-pay | Admitting: Anesthesiology

## 2012-10-02 ENCOUNTER — Encounter (HOSPITAL_COMMUNITY)
Admission: RE | Disposition: A | Discharge status: Home / Self Care | Payer: Self-pay | Source: Ambulatory Visit | Attending: Surgery

## 2012-10-02 ENCOUNTER — Encounter (HOSPITAL_COMMUNITY): Payer: Self-pay | Admitting: *Deleted

## 2012-10-02 ENCOUNTER — Ambulatory Visit (HOSPITAL_COMMUNITY): Payer: BC Managed Care – PPO | Admitting: Anesthesiology

## 2012-10-02 DIAGNOSIS — E669 Obesity, unspecified: Secondary | ICD-10-CM

## 2012-10-02 DIAGNOSIS — Z90710 Acquired absence of both cervix and uterus: Secondary | ICD-10-CM

## 2012-10-02 DIAGNOSIS — R188 Other ascites: Secondary | ICD-10-CM | POA: Diagnosis present

## 2012-10-02 DIAGNOSIS — K625 Hemorrhage of anus and rectum: Secondary | ICD-10-CM | POA: Diagnosis present

## 2012-10-02 DIAGNOSIS — Z6841 Body Mass Index (BMI) 40.0 and over, adult: Secondary | ICD-10-CM

## 2012-10-02 DIAGNOSIS — R1011 Right upper quadrant pain: Secondary | ICD-10-CM

## 2012-10-02 DIAGNOSIS — I9589 Other hypotension: Secondary | ICD-10-CM | POA: Diagnosis not present

## 2012-10-02 DIAGNOSIS — C786 Secondary malignant neoplasm of retroperitoneum and peritoneum: Principal | ICD-10-CM | POA: Diagnosis present

## 2012-10-02 DIAGNOSIS — K8020 Calculus of gallbladder without cholecystitis without obstruction: Secondary | ICD-10-CM

## 2012-10-02 DIAGNOSIS — C549 Malignant neoplasm of corpus uteri, unspecified: Secondary | ICD-10-CM | POA: Diagnosis present

## 2012-10-02 DIAGNOSIS — C800 Disseminated malignant neoplasm, unspecified: Secondary | ICD-10-CM

## 2012-10-02 HISTORY — PX: LAPAROSCOPY: SHX197

## 2012-10-02 SURGERY — LAPAROSCOPY, DIAGNOSTIC
Anesthesia: General | Site: Abdomen | Wound class: Clean Contaminated

## 2012-10-02 MED ORDER — CEFAZOLIN SODIUM-DEXTROSE 2-3 GM-% IV SOLR
INTRAVENOUS | Status: AC
  Filled 2012-10-02: qty 50

## 2012-10-02 MED ORDER — LACTATED RINGERS IV SOLN
INTRAVENOUS | Status: DC
  Administered 2012-10-02: 1000 mL via INTRAVENOUS

## 2012-10-02 MED ORDER — TRIAMTERENE-HCTZ 37.5-25 MG PO TABS
1.0000 | ORAL_TABLET | Freq: Every morning | ORAL | Status: DC
  Administered 2012-10-02 – 2012-10-04 (×3): 1 via ORAL
  Filled 2012-10-02 (×3): qty 1

## 2012-10-02 MED ORDER — IOHEXOL 300 MG/ML  SOLN
INTRAMUSCULAR | Status: AC
  Filled 2012-10-02: qty 1

## 2012-10-02 MED ORDER — DEXMEDETOMIDINE HCL IN NACL 200 MCG/50ML IV SOLN
0.7000 ug/kg/h | INTRAVENOUS | Status: DC
Start: 2012-10-02 — End: 2012-10-02
  Administered 2012-10-02: 0.4 ug/kg/h via INTRAVENOUS
  Filled 2012-10-02 (×3): qty 50

## 2012-10-02 MED ORDER — LIDOCAINE HCL (CARDIAC) 20 MG/ML IV SOLN
INTRAVENOUS | Status: DC | PRN
  Administered 2012-10-02: 100 mg via INTRAVENOUS

## 2012-10-02 MED ORDER — ENOXAPARIN SODIUM 40 MG/0.4ML ~~LOC~~ SOLN
40.0000 mg | SUBCUTANEOUS | Status: DC
  Administered 2012-10-02 – 2012-10-03 (×2): 40 mg via SUBCUTANEOUS
  Filled 2012-10-02 (×3): qty 0.4

## 2012-10-02 MED ORDER — KETAMINE HCL 50 MG/ML IJ SOLN
INTRAMUSCULAR | Status: DC | PRN
  Administered 2012-10-02: 50 mg via INTRAMUSCULAR

## 2012-10-02 MED ORDER — ROCURONIUM BROMIDE 100 MG/10ML IV SOLN
INTRAVENOUS | Status: DC | PRN
  Administered 2012-10-02: 50 mg via INTRAVENOUS
  Administered 2012-10-02: 30 mg via INTRAVENOUS

## 2012-10-02 MED ORDER — GLYCOPYRROLATE 0.2 MG/ML IJ SOLN
INTRAMUSCULAR | Status: DC | PRN
  Administered 2012-10-02: .8 mg via INTRAVENOUS

## 2012-10-02 MED ORDER — NEOSTIGMINE METHYLSULFATE 1 MG/ML IJ SOLN
INTRAMUSCULAR | Status: DC | PRN
  Administered 2012-10-02: 5 mg via INTRAVENOUS

## 2012-10-02 MED ORDER — MEPERIDINE HCL 50 MG/ML IJ SOLN: 6.2500 mg | INTRAMUSCULAR | Status: DC | PRN

## 2012-10-02 MED ORDER — SODIUM CHLORIDE 0.45 % IV SOLN
INTRAVENOUS | Status: DC
  Administered 2012-10-02 – 2012-10-04 (×4): via INTRAVENOUS

## 2012-10-02 MED ORDER — CHLORHEXIDINE GLUCONATE 4 % EX LIQD
1.0000 "application " | Freq: Once | CUTANEOUS | Status: DC
  Filled 2012-10-02: qty 15

## 2012-10-02 MED ORDER — ACETAMINOPHEN 10 MG/ML IV SOLN: 1000.0000 mg | Freq: Once | INTRAVENOUS | Status: DC | PRN

## 2012-10-02 MED ORDER — OXYCODONE HCL 5 MG PO TABS: 5.0000 mg | ORAL_TABLET | Freq: Once | ORAL | Status: DC | PRN

## 2012-10-02 MED ORDER — HYDROMORPHONE HCL PF 1 MG/ML IJ SOLN
1.0000 mg | INTRAMUSCULAR | Status: DC | PRN
  Administered 2012-10-02 – 2012-10-04 (×2): 1 mg via INTRAVENOUS
  Filled 2012-10-02 (×2): qty 1

## 2012-10-02 MED ORDER — BIOTENE DRY MOUTH MT LIQD
15.0000 mL | Freq: Two times a day (BID) | OROMUCOSAL | Status: DC
  Administered 2012-10-02 – 2012-10-04 (×4): 15 mL via OROMUCOSAL

## 2012-10-02 MED ORDER — DEXAMETHASONE SODIUM PHOSPHATE 10 MG/ML IJ SOLN
INTRAMUSCULAR | Status: DC | PRN
  Administered 2012-10-02: 10 mg via INTRAVENOUS

## 2012-10-02 MED ORDER — HYDROMORPHONE HCL PF 1 MG/ML IJ SOLN: 0.2500 mg | INTRAMUSCULAR | Status: DC | PRN

## 2012-10-02 MED ORDER — ACETAMINOPHEN 10 MG/ML IV SOLN
INTRAVENOUS | Status: AC
  Filled 2012-10-02: qty 100

## 2012-10-02 MED ORDER — BUPIVACAINE-EPINEPHRINE 0.25% -1:200000 IJ SOLN
INTRAMUSCULAR | Status: DC | PRN
  Administered 2012-10-02: 12 mL

## 2012-10-02 MED ORDER — ONDANSETRON HCL 4 MG PO TABS: 4.0000 mg | ORAL_TABLET | Freq: Four times a day (QID) | ORAL | Status: DC | PRN

## 2012-10-02 MED ORDER — SCOPOLAMINE 1 MG/3DAYS TD PT72
1.0000 | MEDICATED_PATCH | TRANSDERMAL | Status: DC
  Administered 2012-10-02: 1.5 mg via TRANSDERMAL

## 2012-10-02 MED ORDER — SCOPOLAMINE 1 MG/3DAYS TD PT72
MEDICATED_PATCH | TRANSDERMAL | Status: AC
  Filled 2012-10-02: qty 1

## 2012-10-02 MED ORDER — ACETAMINOPHEN 10 MG/ML IV SOLN
1000.0000 mg | Freq: Once | INTRAVENOUS | Status: AC
  Administered 2012-10-02: 1000 mg via INTRAVENOUS

## 2012-10-02 MED ORDER — DEXTROSE 5 % IV SOLN
3.0000 g | INTRAVENOUS | Status: AC
  Administered 2012-10-02: 3 g via INTRAVENOUS
  Filled 2012-10-02: qty 3000

## 2012-10-02 MED ORDER — ONDANSETRON HCL 4 MG/2ML IJ SOLN
INTRAMUSCULAR | Status: DC | PRN
  Administered 2012-10-02: 4 mg via INTRAVENOUS

## 2012-10-02 MED ORDER — CEFAZOLIN SODIUM 1-5 GM-% IV SOLN
INTRAVENOUS | Status: AC
  Filled 2012-10-02: qty 50

## 2012-10-02 MED ORDER — OXYCODONE HCL 5 MG/5ML PO SOLN
5.0000 mg | Freq: Once | ORAL | Status: DC | PRN
  Filled 2012-10-02: qty 5

## 2012-10-02 MED ORDER — SUCCINYLCHOLINE CHLORIDE 20 MG/ML IJ SOLN
INTRAMUSCULAR | Status: DC | PRN
  Administered 2012-10-02: 200 mg via INTRAVENOUS

## 2012-10-02 MED ORDER — PROPOFOL 10 MG/ML IV BOLUS
INTRAVENOUS | Status: DC | PRN
  Administered 2012-10-02: 300 mg via INTRAVENOUS

## 2012-10-02 MED ORDER — ONDANSETRON HCL 4 MG/2ML IJ SOLN: 4.0000 mg | Freq: Four times a day (QID) | INTRAMUSCULAR | Status: DC | PRN

## 2012-10-02 MED ORDER — OXYCODONE-ACETAMINOPHEN 5-325 MG PO TABS
1.0000 | ORAL_TABLET | ORAL | Status: DC | PRN
  Administered 2012-10-03 (×2): 1 via ORAL
  Filled 2012-10-02 (×2): qty 1

## 2012-10-02 MED ORDER — PROMETHAZINE HCL 25 MG/ML IJ SOLN: 6.2500 mg | INTRAMUSCULAR | Status: DC | PRN

## 2012-10-02 MED ORDER — CEFAZOLIN SODIUM 1-5 GM-% IV SOLN
1.0000 g | Freq: Four times a day (QID) | INTRAVENOUS | Status: AC
  Administered 2012-10-02 – 2012-10-03 (×3): 1 g via INTRAVENOUS
  Filled 2012-10-02 (×4): qty 50

## 2012-10-02 MED ORDER — BUPIVACAINE-EPINEPHRINE 0.25% -1:200000 IJ SOLN
INTRAMUSCULAR | Status: AC
  Filled 2012-10-02: qty 1

## 2012-10-02 SURGICAL SUPPLY — 46 items
APL SKNCLS STERI-STRIP NONHPOA (GAUZE/BANDAGES/DRESSINGS) ×2
APPLIER CLIP ROT 10 11.4 M/L (STAPLE) ×3
APR CLP MED LRG 11.4X10 (STAPLE) ×2
BAG SPEC RTRVL LRG 6X4 10 (ENDOMECHANICALS) ×2
BENZOIN TINCTURE PRP APPL 2/3 (GAUZE/BANDAGES/DRESSINGS) ×3 IMPLANT
CANISTER SUCTION 2500CC (MISCELLANEOUS) ×17 IMPLANT
CHLORAPREP W/TINT 26ML (MISCELLANEOUS) ×7 IMPLANT
CLIP APPLIE ROT 10 11.4 M/L (STAPLE) ×2 IMPLANT
CLOTH BEACON ORANGE TIMEOUT ST (SAFETY) ×3 IMPLANT
COVER MAYO STAND STRL (DRAPES) ×3 IMPLANT
DECANTER SPIKE VIAL GLASS SM (MISCELLANEOUS) ×3 IMPLANT
DRAPE C-ARM 42X120 X-RAY (DRAPES) ×3 IMPLANT
DRAPE LAPAROSCOPIC ABDOMINAL (DRAPES) ×3 IMPLANT
DRAPE UTILITY XL STRL (DRAPES) ×3 IMPLANT
DRSG TEGADERM 2-3/8X2-3/4 SM (GAUZE/BANDAGES/DRESSINGS) ×9 IMPLANT
DRSG TEGADERM 4X4.75 (GAUZE/BANDAGES/DRESSINGS) ×3 IMPLANT
ELECT REM PT RETURN 9FT ADLT (ELECTROSURGICAL) ×3
ELECTRODE REM PT RTRN 9FT ADLT (ELECTROSURGICAL) ×2 IMPLANT
FILTER SMOKE EVAC LAPAROSHD (FILTER) ×3 IMPLANT
GLOVE BIO SURGEON STRL SZ7 (GLOVE) ×5 IMPLANT
GLOVE BIOGEL PI IND STRL 7.0 (GLOVE) ×2 IMPLANT
GLOVE BIOGEL PI IND STRL 7.5 (GLOVE) ×4 IMPLANT
GLOVE BIOGEL PI INDICATOR 7.0 (GLOVE) ×1
GLOVE BIOGEL PI INDICATOR 7.5 (GLOVE) ×3
GOWN STRL NON-REIN LRG LVL3 (GOWN DISPOSABLE) ×6 IMPLANT
GOWN STRL REIN XL XLG (GOWN DISPOSABLE) ×5 IMPLANT
HOVERMATT SINGLE USE (MISCELLANEOUS) ×2 IMPLANT
KIT BASIN OR (CUSTOM PROCEDURE TRAY) ×3 IMPLANT
NS IRRIG 1000ML POUR BTL (IV SOLUTION) ×3 IMPLANT
PACK UNIVERSAL I (CUSTOM PROCEDURE TRAY) ×2 IMPLANT
POUCH SPECIMEN RETRIEVAL 10MM (ENDOMECHANICALS) ×3 IMPLANT
RINGERS IRRIG 1000ML POUR BTL (IV SOLUTION) ×3 IMPLANT
SET CHOLANGIOGRAPH MIX (MISCELLANEOUS) ×3 IMPLANT
SET IRRIG TUBING LAPAROSCOPIC (IRRIGATION / IRRIGATOR) ×3 IMPLANT
SOLUTION ANTI FOG 6CC (MISCELLANEOUS) ×3 IMPLANT
STRIP CLOSURE SKIN 1/2X4 (GAUZE/BANDAGES/DRESSINGS) ×3 IMPLANT
SUT MNCRL AB 4-0 PS2 18 (SUTURE) ×3 IMPLANT
TAPE CLOTH 4X10 WHT NS (GAUZE/BANDAGES/DRESSINGS) ×4 IMPLANT
TOWEL OR 17X26 10 PK STRL BLUE (TOWEL DISPOSABLE) ×3 IMPLANT
TRAY LAP CHOLE (CUSTOM PROCEDURE TRAY) ×3 IMPLANT
TROCAR BLADELESS OPT 5 100 (ENDOMECHANICALS) ×2 IMPLANT
TROCAR BLADELESS OPT 5 150 (ENDOMECHANICALS) ×2 IMPLANT
TROCAR BLADELESS OPT 5 75 (ENDOMECHANICALS) ×6 IMPLANT
TROCAR XCEL BLUNT TIP 100MML (ENDOMECHANICALS) ×3 IMPLANT
TROCAR XCEL NON-BLD 11X100MML (ENDOMECHANICALS) ×3 IMPLANT
TUBING INSUFFLATION 10FT LAP (TUBING) ×3 IMPLANT

## 2012-10-02 NOTE — H&P (View-Only) (Signed)
Patient ID: [name], female   DOB: [birth year], 47 y.o.   MRN: [id number]  Chief Complaint  Patient presents with  . Cholelithiasis    new pt- eval gallbladder  w/ stones    HPI [name] is a 47 y.o. female.  Referred by [facility] ED for gallbladder disease - Dr. Linwood Dibbles PCP - Dr. Silvestre Moment HPI This is a 47 year old female who presents with recent onset of severe epigastric and right upper quadrant pain that radiates through to her back. This is associated with significant abdominal bloating as well as nausea. She denies any diarrhea. The patient has had 2 emergency department visits for this problem in the last 2 weeks. She was diagnosed with multiple gallstones but no clear evidence of cholecystitis. The ultrasound study was limited by her body habitus.  The patient has had significant weight gain over the last couple of years. In searching back through her recent notes, in 2012 She weighed 420 pounds. In February of 2014 she weighed 5:15. Today she weighs 570 with a BMI of 79. She claims that she is following the instructions of her nutritionist. She also claims that her appetite is limited by her nausea. Past Medical History  Diagnosis Date  . Uterine cancer   . Obesity   . DVT (deep venous thrombosis) 2009    Past Surgical History  Procedure Laterality Date  . Ankle surgery  1950  . Wisdom tooth extraction    . Tonsillectomy and adenoidectomy    . Hysteroscopy w/d&c    . Abdominal hysterectomy  12/2009    [facility], BSO    Family History  Problem Relation Age of Onset  . Heart disease Father   . Diabetes Brother   . Heart disease Mother     Social History History  Substance Use Topics  . Smoking status: Never Smoker   . Smokeless tobacco: Not on file  . Alcohol Use: Yes     Comment: social, Occassionally    No Known Allergies  Current Outpatient Prescriptions  Medication Sig Dispense Refill  . Ascorbic Acid (VITAMIN C) 1000 MG tablet Take 1,000 mg by mouth  daily.      . Cyanocobalamin (VITAMIN B-12 PO) Take 2,500 mg by mouth daily.       . folic acid (FOLVITE) 400 MCG tablet Take 400 mcg by mouth daily.        . magnesium gluconate (MAGONATE) 500 MG tablet Take 500 mg by mouth daily.      . magnesium hydroxide (MILK OF MAGNESIA) 400 MG/5ML suspension Take 30 mLs by mouth daily as needed for constipation.  360 mL  0  . Multiple Vitamins-Minerals (MULTIVITAMIN WITH MINERALS) tablet Take 1 tablet by mouth daily.       . naproxen sodium (ANAPROX) 220 MG tablet Take 880 mg by mouth daily.      . ondansetron (ZOFRAN) 4 MG tablet Take 1 tablet (4 mg total) by mouth every 6 (six) hours.  20 tablet  0  . oxyCODONE-acetaminophen (PERCOCET/ROXICET) 5-325 MG per tablet Take 2 tablets by mouth every 4 (four) hours as needed for pain.  15 tablet  0  . traMADol (ULTRAM) 50 MG tablet Take 1 tablet (50 mg total) by mouth every 6 (six) hours as needed for pain.  15 tablet  0  . triamterene-hydrochlorothiazide (MAXZIDE-25) 37.5-25 MG per tablet Take 1 tablet by mouth daily.         No current facility-administered medications for this visit.  Review of Systems Review of Systems  Constitutional: Negative for fever, chills and unexpected weight change.  HENT: Negative for hearing loss, congestion, sore throat, trouble swallowing and voice change.   Eyes: Negative for visual disturbance.  Respiratory: Negative for cough and wheezing.   Cardiovascular: Negative for chest pain, palpitations and leg swelling.  Gastrointestinal: Positive for nausea, abdominal pain and abdominal distention. Negative for vomiting, diarrhea, constipation, blood in stool and anal bleeding.  Genitourinary: Negative for hematuria, vaginal bleeding and difficulty urinating.  Musculoskeletal: Negative for arthralgias.  Skin: Negative for rash and wound.  Neurological: Negative for seizures, syncope and headaches.  Hematological: Negative for adenopathy. Does not bruise/bleed easily.   Psychiatric/Behavioral: Negative for confusion.    Blood pressure 112/70, pulse 84, temperature 97.8 F (36.6 C), temperature source Temporal, resp. rate 24, height 5\' 11"  (1.803 m), weight 569 lb 6.4 oz (258.278 kg). Body mass index is 79.45 kg/(m^2).  Physical Exam Physical Exam Morbidly obese female in NAD HEENT:  EOMI, sclera anicteric Neck:  No masses, no thyromegaly Lungs:  CTA bilaterally; normal respiratory effort CV:  Regular rate and rhythm; no murmurs Abd:  +bowel sounds, massive obese with enormous pannus - no active panniculitis Tender in RUQ and epigastrium; no tenderness in LUQ No palpable masses. Ext:  Well-perfused; no edema Skin:  Warm, dry; no sign of jaundice  Data Reviewed Lab Results  Component Value Date   WBC 7.7 09/09/2012   HGB 12.7 09/09/2012   HCT 38.4 09/09/2012   MCV 87.5 09/09/2012   PLT 274 09/09/2012   Lab Results  Component Value Date   CREATININE 0.89 09/09/2012   BUN 14 09/09/2012   NA 136 09/09/2012   K 3.9 09/09/2012   CL 96 09/09/2012   CO2 29 09/09/2012   Lab Results  Component Value Date   ALT 17 09/09/2012   AST 25 09/09/2012   ALKPHOS 55 09/09/2012   BILITOT 0.4 09/09/2012   Lab Results  Component Value Date   LIPASE 13 09/09/2012   RADIOLOGY REPORT*  Clinical Data: Right upper quadrant pain and rectal bleeding.  Uterine cancer.  COMPLETE ABDOMINAL ULTRASOUND  Comparison: 02/11/2009 CT.  Findings:  Gallbladder: Gravel within the gallbladder. Pericholecystic  fluid/ascites limits evaluation for detection of inflammation. No  gallbladder wall thickening. The patient was not tender over this  region as per ultrasound technologist appear  Common bile duct: 5.0 mm.  Liver: Limited evaluation. Fatty infiltration suspected. Free  fluid adjacent to liver  IVC: Not visualized secondary to patient's habitus and bowel gas.  Pancreas: Poorly delineated secondary to patient's habitus and  bowel gas.  Spleen: Poorly delineated secondary  to patient's habitus and bowel  gas.  Right Kidney: Not visualized secondary to patient's habitus and  bowel gas.  Left Kidney: 12.9 cm. No hydronephrosis. No obvious mass  although evaluation limited.  Abdominal aorta: Not visualized secondary to patient's habitus and  bowel gas.  IMPRESSION:  Gravel within the gallbladder. Pericholecystic fluid/ascites  limits evaluation for detection of inflammation. No gallbladder  wall thickening. The patient was not tender over this region as  per ultrasound technologist appear  Fatty infiltration liver suspected.  Ascites.  Evaluation otherwise limited secondary to patient's habitus and  bowel gas.  Please see above.  Original Report Authenticated By: Lacy Duverney, M.D.   Assessment    1.  Chronic calculus cholecystitis 2.  Supermorbid obesity with ongoing rapid weight gain    Plan    Laparoscopic cholecystectomy with intraoperative cholangiogram. Her  surgery will obviously be complicated by her massive abdominal girth.  I discussed the procedure in detail.  The patient was given Agricultural engineer.  We discussed the risks and benefits of a laparoscopic cholecystectomy and possible cholangiogram including, but not limited to bleeding, infection, injury to surrounding structures such as the intestine or liver, bile leak, retained gallstones, need to convert to an open procedure, prolonged diarrhea, blood clots such as  DVT, common bile duct injury, anesthesia risks, and possible need for additional procedures.  The likelihood of improvement in symptoms and return to the patient's normal status is good. We discussed the typical post-operative recovery course         Navjot Pilgrim K. 09/12/2012, 10:28 AM

## 2012-10-02 NOTE — Op Note (Signed)
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 Assistant:  Dr. Avel Peace Anesthesia:  GETT Indications:  This is a 47 year old female who presents with recent epigastric and right upper quadrant abdominal pain as well as abdominal bloating and nausea. Ultrasound showed multiple gallstones but no sign of cholecystitis. The patient has had a significant weight gain over the last year and a half. She had a hysterectomy in 2011 for endometrial cancer. She presents now for laparoscopic cholecystectomy. The patient is super morbidly obese with a BMI of 80 and a weight over 580 pounds.  Description of procedure: The patient brought to the operating room and placed in a supine position on the operating room table. After an adequate level of general anesthesia was obtained we spent some time securing the patient to the operating table. We required the use of the side table extensions due to her massive size. Her lower extremities and hips were secured to the bed as was her chest. Her abdomen was prepped with chlor prep and draped in sterile fashion. A timeout was taken to ensure the proper patient proper procedure. We infiltrated the area below the left costal margin with 0.25% Marcaine. A 5 mm Optiview trocar was used to carefully cannulate the peritoneal cavity. We began insufflating CO2 maintaining a maximum pressure of 15 mm mercury. However as we were trying to insufflate a large amount of thin reddish ascites came back to the port into the insufflation tubing. We inserted the suction irrigator into the port we aspirated about 2 L of ascites. We were able to insufflate and advanced the camera. Immediately we identified multiple omental and peritoneal nodules. There is significant peritoneal  studding throughout the entire abdomen. There is significant amount of ascites in the lower abdomen. There were some large omental and peritoneal implants in the upper abdomen. We placed an additional 5 mm port above the level of the umbilicus on the left and then another 5 mm port in the midline above the umbilicus. We moved the camera to the midline port. We were then able to biopsy some of the omental nodules. These were sent for frozen section. The pathologist called the room and informed us that this represented metastatic adenocarcinoma consistent with endometrial cancer. While we were waiting for the frozen section we used the suction device to suction out over 15 L of reddish ascites. There is significant decompression of the abdomen. The patient still has a significant amount of ascites in her pelvis as well as her right side as we never explored those areas with the laparoscope the suction device. However we were concerned about significant fluid shifts. Some of the ascites was sent for cytology. After we receive the report of the frozen section we removed our trochars. The incisions were at closed with 4-0 Monocryl. There were then sealed with Dermabond. She was then extubated and brought to the recovery room in stable condition. All sponge, initially, and needle counts are correct.  Wilmon Arms. Corliss Skains, MD, [facility] Surgery  General/ Trauma Surgery  10/02/2012 12:10 PM

## 2012-10-02 NOTE — Anesthesia Postprocedure Evaluation (Signed)
Anesthesia Post Note  Patient: [name]  Procedure(s) Performed: Procedure(s) (LRB): LAPAROSCOPY DIAGNOSTIC, perocentisis, omental biopsy (N/A)  Anesthesia type: General  Patient location: PACU  Post pain: Pain level controlled  Post assessment: Post-op Vital signs reviewed  Last Vitals: BP 104/51  Pulse 72  Temp(Src) 36.9 C (Oral)  Resp 18  Wt 577 lb 6.2 oz (261.9 kg)  BMI 80.56 kg/m2  SpO2 93%  Post vital signs: Reviewed  Level of consciousness: awake, following commands, denies pain.  Complications: No apparent anesthesia complications

## 2012-10-02 NOTE — Transfer of Care (Signed)
Immediate Anesthesia Transfer of Care Note  Patient: [name]  Procedure(s) Performed: Procedure(s) with comments: LAPAROSCOPY DIAGNOSTIC, perocentisis, omental biopsy (N/A) - removed a total of 15,000 of acities  Patient Location: PACU  Anesthesia Type:General  Level of Consciousness: awake, alert , oriented and patient cooperative  Airway & Oxygen Therapy: Patient Spontanous Breathing and Patient connected to face mask oxygen  Post-op Assessment: Report given to PACU RN, Post -op Vital signs reviewed and stable and Patient moving all extremities  Post vital signs: Reviewed and stable  Complications: No apparent anesthesia complications

## 2012-10-02 NOTE — Anesthesia Preprocedure Evaluation (Addendum)
Anesthesia Evaluation  Patient identified by MRN, date of birth, ID band Patient awake    Reviewed: Allergy & Precautions, H&P , NPO status , Patient's Chart, lab work & pertinent test results  History of Anesthesia Complications (+) PONV and MALIGNANT HYPERTHERMIA  Airway       Dental  (+) Dental Advisory Given   Pulmonary neg pulmonary ROS,          Cardiovascular negative cardio ROS      Neuro/Psych negative neurological ROS  negative psych ROS   GI/Hepatic negative GI ROS, Neg liver ROS,   Endo/Other  Morbid obesity  Renal/GU negative Renal ROS     Musculoskeletal negative musculoskeletal ROS (+)   Abdominal   Peds  Hematology negative hematology ROS (+)   Anesthesia Other Findings   Reproductive/Obstetrics negative OB ROS                          Anesthesia Physical Anesthesia Plan  ASA: III  Anesthesia Plan: General   Post-op Pain Management:    Induction: Intravenous and Rapid sequence  Airway Management Planned: Oral ETT  Additional Equipment:   Intra-op Plan:   Post-operative Plan: Extubation in OR  Informed Consent: I have reviewed the patients History and Physical, chart, labs and discussed the procedure including the risks, benefits and alternatives for the proposed anesthesia with the patient or authorized representative who has indicated his/her understanding and acceptance.   Dental advisory given  Plan Discussed with: CRNA  Anesthesia Plan Comments:         Anesthesia Quick Evaluation

## 2012-10-02 NOTE — Interval H&P Note (Signed)
History and Physical Interval Note:  10/02/2012 9:12 AM  [name]  has presented today for surgery, with the diagnosis of Chronic calculus cholecystitis  The various methods of treatment have been discussed with the patient and family. After consideration of risks, benefits and other options for treatment, the patient has consented to  Procedure(s): LAPAROSCOPIC CHOLECYSTECTOMY WITH INTRAOPERATIVE CHOLANGIOGRAM (N/A) as a surgical intervention .  The patient's history has been reviewed, patient examined, no change in status, stable for surgery.  I have reviewed the patient's chart and labs.  Questions were answered to the patient's satisfaction.     Bethlehem Langstaff K.

## 2012-10-02 NOTE — Preoperative (Signed)
Beta Blockers   Reason not to administer Beta Blockers:Not Applicable 

## 2012-10-03 ENCOUNTER — Encounter (HOSPITAL_COMMUNITY): Payer: Self-pay | Admitting: Surgery

## 2012-10-03 LAB — CBC
HCT: 32.1 % — ABNORMAL LOW (ref 36.0–46.0)
MCH: 26.3 pg (ref 26.0–34.0)
MCV: 86.1 fL (ref 78.0–100.0)
RBC: 3.73 MIL/uL — ABNORMAL LOW (ref 3.87–5.11)
WBC: 9.2 10*3/uL (ref 4.0–10.5)

## 2012-10-03 LAB — BASIC METABOLIC PANEL
BUN: 8 mg/dL (ref 6–23)
CO2: 33 mEq/L — ABNORMAL HIGH (ref 19–32)
Chloride: 98 mEq/L (ref 96–112)
Creatinine, Ser: 0.91 mg/dL (ref 0.50–1.10)

## 2012-10-03 NOTE — Progress Notes (Signed)
CARE MANAGEMENT NOTE 10/03/2012  Patient:  [name], [name]   Account Number:  [id number]  Date Initiated:  10/03/2012  Documentation initiated by:  Jeffey Janssen  Subjective/Objective Assessment:   morbidly obese female for lap chol,e, ca of the abd cavity found, hypotensive post op     Action/Plan:   home   Anticipated DC Date:  10/06/2012   Anticipated DC Plan:  HOME/SELF CARE  In-house referral  NA      DC Planning Services  NA      [facility] Choice  NA   Choice offered to / List presented to:  NA   DME arranged  NA      DME agency  NA     HH arranged  NA      HH agency  NA   Status of service:  In process, will continue to follow Medicare Important Message given?  NA - LOS <3 / Initial given by admissions (If response is "NO", the following Medicare IM given date fields will be blank) Date Medicare IM given:   Date Additional Medicare IM given:    Discharge Disposition:    Per UR Regulation:  Reviewed for med. necessity/level of care/duration of stay  If discussed at Long Length of Stay Meetings, dates discussed:    Comments:  [id number]/Denarius Sesler Earlene Plater, RN, BSN, CCM: CHART REVIEWED AND UPDATED.  Next chart review due on [id number]. NO DISCHARGE NEEDS PRESENT AT THIS TIME. CASE MANAGEMENT [phone]

## 2012-10-03 NOTE — Progress Notes (Signed)
1 Day Post-Op  Subjective: Patient is feeling much better today after high-volume paracentesis.  Moving around better, breathing easier, no nausea Minimal pain in abdomen Patient was told of her diagnosis by her family last night.  She is handling it remarkably well.  She is happy that she is feeling much better and has an explanation for her symptoms.  Objective: Vital signs in last 24 hours: Temp:  [97.3 F (36.3 C)-99.3 F (37.4 C)] 98.5 F (36.9 C) (06/13 0400) Pulse Rate:  [67-91] 67 (06/13 0400) Resp:  [14-26] 14 (06/13 0400) BP: (99-140)/(51-66) 117/57 mmHg (06/13 0400) SpO2:  [90 %-96 %] 95 % (06/13 0400) Weight:  [563 lb 15 oz (255.8 kg)-577 lb 6.2 oz (261.9 kg)] 563 lb 15 oz (255.8 kg) (06/13 0400) Last BM Date: 09/30/12  Intake/Output from previous day: 06/12 0701 - 06/13 0700 In: 3463.3 [I.V.:3313.3; IV Piggyback:150] Out: 40981 [Blood:10] Intake/Output this shift:    General appearance: alert, cooperative and no distress GI: much softer, less distended Incisions - dry; no ascitic leak  Lab Results:   Recent Labs  10/03/12 0346  WBC 9.2  HGB 9.8*  HCT 32.1*  PLT 300   BMET  Recent Labs  10/03/12 0346  NA 138  K 3.8  CL 98  CO2 33*  GLUCOSE 103*  BUN 8  CREATININE 0.91  CALCIUM 8.7   PT/INR No results found for this basename: LABPROT, INR,  in the last 72 hours ABG No results found for this basename: PHART, PCO2, PO2, HCO3,  in the last 72 hours  Studies/Results: No results found.  Anti-infectives: Anti-infectives   Start     Dose/Rate Route Frequency Ordered Stop   10/02/12 1600  ceFAZolin (ANCEF) IVPB 1 g/50 mL premix     1 g 100 mL/hr over 30 Minutes Intravenous Every 6 hours 10/02/12 1415 10/03/12 0357   10/02/12 0703  ceFAZolin (ANCEF) 3 g in dextrose 5 % 50 mL IVPB     3 g 160 mL/hr over 30 Minutes Intravenous On call to O.R. 10/02/12 0703 10/02/12 1018      Assessment/Plan: s/p Procedure(s) with comments: LAPAROSCOPY  DIAGNOSTIC, perocentisis, omental biopsy (N/A) - removed a total of 15,000 of acities Transfer to floor Advance diet, PO pain meds Possible discharge home  LOS: 1 day    Keyry Iracheta K. 10/03/2012

## 2012-10-03 NOTE — Progress Notes (Signed)
Spoke with the patient about upcoming appointment with Dr. Laurette Schimke in Gynecologic Oncology on Tuesday, June 17 at 9 am.  She was informed that Dr. Nelly Rout is aware of her current condition and intra-operative findings.  No concerns voiced.  Instructed to call the office for any needs.

## 2012-10-04 LAB — BASIC METABOLIC PANEL
BUN: 10 mg/dL (ref 6–23)
Calcium: 8.5 mg/dL (ref 8.4–10.5)
Chloride: 97 mEq/L (ref 96–112)
Creatinine, Ser: 0.91 mg/dL (ref 0.50–1.10)
GFR calc Af Amer: 87 mL/min — ABNORMAL LOW (ref >90)
GFR calc non Af Amer: 75 mL/min — ABNORMAL LOW (ref >90)

## 2012-10-04 LAB — CBC
HCT: 33.2 % — ABNORMAL LOW (ref 36.0–46.0)
MCH: 26 pg (ref 26.0–34.0)
MCHC: 30.1 g/dL (ref 30.0–36.0)
MCV: 86.5 fL (ref 78.0–100.0)
RDW: 15 % (ref 11.5–15.5)

## 2012-10-04 MED ORDER — OXYCODONE-ACETAMINOPHEN 5-325 MG PO TABS: 1.0000 | ORAL_TABLET | ORAL | Status: DC | PRN

## 2012-10-04 NOTE — Progress Notes (Signed)
Pt for discharge home today. Dermabond sites to abdomen CDI. IV d/cd. Tolerates her diet. Mother at bedside to assist with d/c. D/C instructions & Rx given with verbalized understanding.

## 2012-10-04 NOTE — Discharge Summary (Signed)
Physician Discharge Summary  Patient ID: [name] MRN: [id number] DOB/AGE: 1966/01/12 47 y.o.  Admit date: 10/02/2012 Discharge date: 10/04/2012  Admission Diagnoses:  cholecystitis  Discharge Diagnoses:  Carcinomatosis peritonei with ascites  Active Problems:   * No active hospital problems. *   Surgery:  Laparoscopy and biopsy  Discharged Condition: improved pain  Hospital Course:   Had surgery and biopsy.  Arrangments made for followup with Dr. Nelly Rout  Consults: Dr. Nelly Rout  Significant Diagnostic Studies: see path    Discharge Exam: Blood pressure 104/53, pulse 81, temperature 97.9 F (36.6 C), temperature source Oral, resp. rate 20, height 5\' 11"  (1.803 m), weight 563 lb 15 oz (255.8 kg), SpO2 98.00%. Incisions covered with dermbond and look ok.  Disposition: 01-Home or Self Care  Discharge Orders   Future Appointments Provider Department Dept Phone   10/07/2012 9:00 AM Laurette Schimke, MD [facility] GYNECOLOGICAL ONCOLOGY [phone]   10/27/2012 9:40 AM Wilmon Arms. Tsuei, MD [facility] Surgery, [place] [phone]   Future Orders Complete By Expires     Diet Carb Modified  As directed     Increase activity slowly  As directed     No wound care  As directed         Medication List    TAKE these medications       folic acid 400 MCG tablet  Commonly known as:  FOLVITE  Take 400 mcg by mouth daily.     HYDROcodone-acetaminophen 5-325 MG per tablet  Commonly known as:  NORCO/VICODIN  Take 1 tablet by mouth every 6 (six) hours as needed for pain.     magnesium gluconate 500 MG tablet  Commonly known as:  MAGONATE  Take 500 mg by mouth daily.     multivitamin with minerals tablet  Take 1 tablet by mouth daily.     naproxen sodium 220 MG tablet  Commonly known as:  ANAPROX  Take 880 mg by mouth every morning.     ondansetron 4 MG tablet  Commonly known as:  ZOFRAN  Take 1 tablet (4 mg total) by mouth every 6 (six) hours.     oxyCODONE-acetaminophen 5-325 MG per tablet  Commonly known as:  PERCOCET/ROXICET  Take 2 tablets by mouth every 4 (four) hours as needed for pain.     oxyCODONE-acetaminophen 5-325 MG per tablet  Commonly known as:  PERCOCET/ROXICET  Take 1-2 tablets by mouth every 4 (four) hours as needed.     traMADol 50 MG tablet  Commonly known as:  ULTRAM  Take 1 tablet (50 mg total) by mouth every 6 (six) hours as needed for pain.     triamterene-hydrochlorothiazide 37.5-25 MG per tablet  Commonly known as:  MAXZIDE-25  Take 1 tablet by mouth every morning.     VITAMIN B-12 PO  Take 2,500 mg by mouth daily.     vitamin C 1000 MG tablet  Take 1,000 mg by mouth daily.           Follow-up Information   Follow up with TSUEI,Pritesh Sobecki K., MD. Schedule an appointment as soon as possible for a visit in 3 weeks.   Contact information:   [address] [place] [address] [phone]       Signed: Valarie Merino 10/04/2012, 11:50 AM

## 2012-10-06 NOTE — Progress Notes (Signed)
Utilization review completed.  

## 2012-10-07 ENCOUNTER — Ambulatory Visit (HOSPITAL_BASED_OUTPATIENT_CLINIC_OR_DEPARTMENT_OTHER): Payer: BC Managed Care – PPO

## 2012-10-07 ENCOUNTER — Ambulatory Visit: Payer: BC Managed Care – PPO | Attending: Gynecologic Oncology | Admitting: Gynecologic Oncology

## 2012-10-07 ENCOUNTER — Encounter: Payer: Self-pay | Admitting: Gynecologic Oncology

## 2012-10-07 VITALS — BP 102/70 | HR 78 | Temp 97.9°F | Resp 24 | Ht 68.0 in | Wt >= 6400 oz

## 2012-10-07 DIAGNOSIS — C549 Malignant neoplasm of corpus uteri, unspecified: Secondary | ICD-10-CM | POA: Insufficient documentation

## 2012-10-07 DIAGNOSIS — Z90710 Acquired absence of both cervix and uterus: Secondary | ICD-10-CM | POA: Insufficient documentation

## 2012-10-07 DIAGNOSIS — Z86718 Personal history of other venous thrombosis and embolism: Secondary | ICD-10-CM | POA: Insufficient documentation

## 2012-10-07 DIAGNOSIS — R609 Edema, unspecified: Secondary | ICD-10-CM | POA: Insufficient documentation

## 2012-10-07 DIAGNOSIS — C541 Malignant neoplasm of endometrium: Secondary | ICD-10-CM | POA: Insufficient documentation

## 2012-10-07 DIAGNOSIS — Z9079 Acquired absence of other genital organ(s): Secondary | ICD-10-CM | POA: Insufficient documentation

## 2012-10-07 DIAGNOSIS — C786 Secondary malignant neoplasm of retroperitoneum and peritoneum: Secondary | ICD-10-CM | POA: Insufficient documentation

## 2012-10-07 LAB — COMPREHENSIVE METABOLIC PANEL (CC13)
AST: 22 U/L (ref 5–34)
Albumin: 1.9 g/dL — ABNORMAL LOW (ref 3.5–5.0)
Alkaline Phosphatase: 55 U/L (ref 40–150)
BUN: 8.6 mg/dL (ref 7.0–26.0)
Creatinine: 0.7 mg/dL (ref 0.6–1.1)
Potassium: 3.6 mEq/L (ref 3.5–5.1)

## 2012-10-07 LAB — CBC WITH DIFFERENTIAL/PLATELET
BASO%: 0.7 % (ref 0.0–2.0)
EOS%: 6 % (ref 0.0–7.0)
HCT: 34.9 % (ref 34.8–46.6)
MCH: 27.1 pg (ref 25.1–34.0)
MCHC: 32.7 g/dL (ref 31.5–36.0)
NEUT%: 72.6 % (ref 38.4–76.8)
lymph#: 0.8 10*3/uL — ABNORMAL LOW (ref 0.9–3.3)

## 2012-10-07 NOTE — Patient Instructions (Signed)
Followup with the appointment with Dr. Welton Flakes in medical oncology GYN oncology in 3 months   Thank you very much [name] for allowing me to provide care for you today.  I appreciate your confidence in choosing our Gynecologic Oncology team.  If you have any questions about your visit today please call our office and we will get back to you as soon as possible.  [name]. Caston Coopersmith MD., PhD Gynecologic Oncology

## 2012-10-07 NOTE — Addendum Note (Signed)
Addended by: Warner Mccreedy D on: 10/07/2012 02:31 PM   Modules accepted: Orders

## 2012-10-07 NOTE — Progress Notes (Signed)
Office visit Note/treatment planning: Gyn-Onc  [name] 47 y.o. female  CC: Endometrial cancer surveillance  Assessment/Plan: [name] is a 47 y.o. with Stage IA, grade 2 endometrial adenocarcinoma initially diagnosed in March of 2009. Robotic hysterectomy bilateral salpingo-oophorectomy with minilaparotomy for removal of the specimen was performed in September 2011. 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  The presumption is that this represents recurrent metastatic endometrial adenocarcinoma. I will confirm with Dr. Hollice Espy that the pathology from the specimen obtained last month correlates with the specimens of 2009 and 2011.   With the presumption that this is recurrent endometrial adenocarcinoma recommend Taxol and platinum therapy.   Referral for [name] to Dr. Welton Flakes for her assessment.   Followup in 3 months  20 minutes of the visit was spent in discussion of the findings and treatment recommendations   HPI:  [name] is a 47 y.o.  diagnosed with a DVT in March of 2009 with Coumadin initiated at that time.  Excessive uterine bleeding developed leading to a dilatation and curettage with hysteroscopy.  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  Due to her morbid obesity, a Mirena IUD was placed along with the addition of Aygestin.  After dramatic, directed weight loss and nutritional support, she went from 523 lbs on her initial visit to 359 lbs in July of 2011.  Her BMI was reduced to 50.  On December 27, 2009, she underwent a robotic-assisted laparoscopic hysterectomy, bilateral salpingo-oophorectomy, and mini-laparotomy through the umbilical port with morcellation of the uterus within a bag for delivery of the uterus. 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   INTERVAL HISTORY:   At the last visit [name] reported significant right upper quadrant pain. She was presumed to have cholelithiasis. 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 Omental biopsies were obtained. Pathology was consistent with metastatic adenocarcinoma and the presumption is that this represents recurrent endometrial adenocarcinoma.  [name] has done well since the procedure and is eager to proceed with the next steps in management of this metastatic disease.  Past Surgical Hx:  Past Surgical History  Procedure Laterality Date  . Ankle surgery  1950  . Wisdom tooth extraction    . Tonsillectomy and adenoidectomy    . Hysteroscopy w/d&c    . Abdominal hysterectomy  12/2009    [facility], BSO  . Laparoscopy N/A 10/02/2012    Procedure: LAPAROSCOPY DIAGNOSTIC, perocentisis, omental biopsy;  Surgeon: Wilmon Arms. Corliss Skains, MD;  Location: [facility];  Service: General;  Laterality: N/A;  removed a total of 15,000 of acities   Past Medical Hx:  Past Medical History  Diagnosis Date  . Uterine cancer   . Obesity   . DVT (deep venous thrombosis) 2009  . PONV (postoperative nausea and vomiting)   . Swelling     BOTH LEGS  . Rash     LOWER ABD  . Gallstones    Family Hx:  Family History  Problem Relation Age of Onset  . Heart disease Father   . Diabetes Brother   . Heart disease Mother    REVIEW OF SYSTEMS Constitutional  Cardiovascular  No chest pain, shortness of breath, or edema  Pulmonary  No cough or wheeze.  Gastro Intestinal  No nausea, vomitting, or diarrhoea. No bright red blood per rectum, no abdominal pain, change in bowel movement, or constipation. Reports decreased appetite Genito Urinary   Denies vaginal bleeding Musculo Skeletal  No myalgia, arthralgia, joint swelling or pain  Neurologic  No weakness, numbness, change in gait,  Psychology  No  depression, anxiety, insomnia.    Vitals:  Blood pressure 102/70, pulse 78, temperature 97.9 F (36.6 C), temperature source Oral, resp. rate 24, height 5\' 8"  (1.727 m), weight 577 lb 11.2 oz (262.043 kg).  Physical Exam:  General:  Well developed, well nourished in no acute distress.  Alert and oriented x3. Abdomen:  Evidence of bruising around port sites no evidence of cellulitis minimal tenderness   Extremities:  2-3+ bilateral edema.   Laurette Schimke, NP 10/07/2012, 10:30 AM

## 2012-10-09 ENCOUNTER — Telehealth: Payer: Self-pay | Admitting: Gynecologic Oncology

## 2012-10-09 ENCOUNTER — Telehealth: Payer: Self-pay | Admitting: Oncology

## 2012-10-09 NOTE — Telephone Encounter (Signed)
S/W PT IN RE TO NP APPT 06/24 @ 10 W/DR. KHAN REFERRING DR. Milas Kocher  MAILED

## 2012-10-09 NOTE — Telephone Encounter (Signed)
Patient informed of upcoming appointment with Dr. Welton Flakes.  No concerns voiced.

## 2012-10-09 NOTE — Telephone Encounter (Signed)
C/D 10/09/12 for appt. 10/14/12

## 2012-10-13 ENCOUNTER — Other Ambulatory Visit: Payer: Self-pay | Admitting: Medical Oncology

## 2012-10-14 ENCOUNTER — Other Ambulatory Visit: Payer: BC Managed Care – PPO | Admitting: Lab

## 2012-10-14 ENCOUNTER — Ambulatory Visit (HOSPITAL_COMMUNITY)
Admission: RE | Admit: 2012-10-14 | Discharge: 2012-10-14 | Disposition: A | Discharge status: Home / Self Care | Payer: BC Managed Care – PPO | Source: Ambulatory Visit | Attending: Oncology | Admitting: Oncology

## 2012-10-14 ENCOUNTER — Other Ambulatory Visit: Payer: Self-pay | Admitting: *Deleted

## 2012-10-14 ENCOUNTER — Telehealth: Payer: Self-pay | Admitting: *Deleted

## 2012-10-14 ENCOUNTER — Ambulatory Visit: Payer: BC Managed Care – PPO

## 2012-10-14 ENCOUNTER — Encounter: Payer: Self-pay | Admitting: Oncology

## 2012-10-14 ENCOUNTER — Ambulatory Visit (HOSPITAL_BASED_OUTPATIENT_CLINIC_OR_DEPARTMENT_OTHER): Payer: BC Managed Care – PPO | Admitting: Oncology

## 2012-10-14 VITALS — BP 99/69 | HR 93 | Temp 97.7°F | Resp 20 | Ht 68.0 in | Wt >= 6400 oz

## 2012-10-14 DIAGNOSIS — R188 Other ascites: Secondary | ICD-10-CM | POA: Insufficient documentation

## 2012-10-14 DIAGNOSIS — C541 Malignant neoplasm of endometrium: Secondary | ICD-10-CM

## 2012-10-14 DIAGNOSIS — C549 Malignant neoplasm of corpus uteri, unspecified: Secondary | ICD-10-CM

## 2012-10-14 MED ORDER — PROCHLORPERAZINE 25 MG RE SUPP: 25.0000 mg | Freq: Two times a day (BID) | RECTAL | Status: DC | PRN

## 2012-10-14 MED ORDER — DEXAMETHASONE 4 MG PO TABS: 8.0000 mg | ORAL_TABLET | Freq: Two times a day (BID) | ORAL | Status: DC

## 2012-10-14 MED ORDER — PROCHLORPERAZINE MALEATE 10 MG PO TABS: 10.0000 mg | ORAL_TABLET | Freq: Four times a day (QID) | ORAL | Status: DC | PRN

## 2012-10-14 MED ORDER — LORAZEPAM 0.5 MG PO TABS: 0.5000 mg | ORAL_TABLET | Freq: Four times a day (QID) | ORAL | Status: DC | PRN

## 2012-10-14 MED ORDER — ONDANSETRON HCL 8 MG PO TABS: 8.0000 mg | ORAL_TABLET | Freq: Two times a day (BID) | ORAL | Status: DC

## 2012-10-14 NOTE — Progress Notes (Signed)
[facility]  Telephone:([phone]) [phone] [phone]   MEDICAL ONCOLOGY - INITIAL CONSULATION    Referral MD  Dr Laurette Schimke  Reason for Referral: 47 year old female with prior history of stage I a uterine cancer diagnosed in 2010. Now with recurrent disease.  Chief Complaint  Patient presents with  . new patient  : Hx of stage IA uterine cancer diagnosed in 2010 Recurrent uterine cancer with peritoneal cacinomatosis  HPI: patient is a 47 year old female who has a prior history of DVT in March 2009 at that time she was on Coumadin. Patient subsequently developed excessive uterine bleeding. Bad tablet to dilatation and curettage with hysteroscopy. 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 Due to morbid obesity Mirena IUD was placed along with the addition of Aygestin. Patient had a dramatic directed weight loss with nutritional support she went from 523 pounds to 359 pounds in July of 2011. Patient continued to do well and is 12/27/2009 she at that time underwent a robotic-assisted laparoscopic hysterectomy, bilateral salpingo-oophorectomy, and mini laparotomy through the umbilical port with morcellation of uterus within about the delivery of the uterus. The final pathology did reveal an endometrial adenocarcinoma grade 2 with invasion limited to 1 mm of the myometrium. She sits up quickly continued to do well until June 2014 when she developed right upper quadrant pain. It was presumed to be cholelithiasis. 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 Omental biopsies were obtained. The pathology was consistent with metastatic adenocarcinoma in the presumption of recurrent endometrial carcinoma was performed made. She was seen by Dr. Laurette Schimke on 10/07/2012. She was recommended that she undergo chemotherapy  consisting of Taxol and carboplatinum. She is now referred to medical oncology for further discussion of treatment options. Today patient is very uncomfortable she tells me that her abdomen is significantly enlarged. She feels that her ascites has come back. It is causing her to be short of breath fatigued tired and she is unable to ambulate do to the shortness of breath. She does continue to have significant abdominal pain due to the ascites and the tumor. She has not been able to eat anything due to her abdomen being quite swollen.she also has been having some nausea but no vomiting. She has no headaches no double vision no blurring of vision. She does have back pain. She has some myalgias and arthralgias. She does not have any problems with her bowel or bladder. Remainder of the 14 point review of systems is negative.    Past Medical History  Diagnosis Date  . Uterine cancer   . Obesity   . DVT (deep venous thrombosis) 2009  . PONV (postoperative nausea and vomiting)   . Swelling     BOTH LEGS  . Rash     LOWER ABD  . Gallstones   :  Past Surgical History  Procedure Laterality Date  . Ankle surgery  1950  . Wisdom tooth extraction    . Tonsillectomy and adenoidectomy    . Hysteroscopy w/d&c    . Abdominal hysterectomy  12/2009    [facility], BSO  . Laparoscopy N/A 10/02/2012    Procedure: LAPAROSCOPY DIAGNOSTIC, perocentisis, omental biopsy;  Surgeon: Wilmon Arms. Corliss Skains, MD;  Location: [facility];  Service: General;  Laterality: N/A;  removed a total of 15,000 of acities  :  Current Outpatient Prescriptions  Medication Sig Dispense Refill  .  ondansetron (ZOFRAN) 4 MG tablet Take 1 tablet (4 mg total) by mouth every 6 (six) hours.  20 tablet  0  . oxyCODONE-acetaminophen (PERCOCET/ROXICET) 5-325 MG per tablet Take 2 tablets by mouth every 4 (four) hours as needed for pain.  15 tablet  0  . oxyCODONE-acetaminophen (PERCOCET/ROXICET) 5-325 MG per tablet Take 1-2 tablets by mouth every 4 (four)  hours as needed.  30 tablet  0  . triamterene-hydrochlorothiazide (MAXZIDE-25) 37.5-25 MG per tablet Take 1 tablet by mouth every morning.       . Ascorbic Acid (VITAMIN C) 1000 MG tablet Take 1,000 mg by mouth daily.      . Cyanocobalamin (VITAMIN B-12 PO) Take 2,500 mg by mouth daily.       . folic acid (FOLVITE) 400 MCG tablet Take 400 mcg by mouth daily.        Marland Kitchen HYDROcodone-acetaminophen (NORCO/VICODIN) 5-325 MG per tablet Take 1 tablet by mouth every 6 (six) hours as needed for pain.      . magnesium gluconate (MAGONATE) 500 MG tablet Take 500 mg by mouth daily.      . Multiple Vitamins-Minerals (MULTIVITAMIN WITH MINERALS) tablet Take 1 tablet by mouth daily.       . naproxen sodium (ANAPROX) 220 MG tablet Take 880 mg by mouth every morning.       . traMADol (ULTRAM) 50 MG tablet Take 1 tablet (50 mg total) by mouth every 6 (six) hours as needed for pain.  15 tablet  0   No current facility-administered medications for this visit.     No Known Allergies:  Family History  Problem Relation Age of Onset  . Heart disease Father   . Diabetes Brother   . Heart disease Mother   :  History   Social History  . Marital Status: Single    Spouse Name: N/A    Number of Children: N/A  . Years of Education: N/A   Occupational History  . Not on file.   Social History Main Topics  . Smoking status: Never Smoker   . Smokeless tobacco: Not on file  . Alcohol Use: Yes     Comment: social, Occassionally  . Drug Use: No  . Sexually Active: No   Other Topics Concern  . Not on file   Social History Narrative  . No narrative on file  :  Pertinent items are noted in HPI.  Exam:   Filed Vitals:   10/14/12 1037  BP: 99/69  Pulse: 93  Temp: 97.7 F (36.5 C)  TempSrc: Oral  Resp: 20  Height: 5\' 8"  (1.727 m)  Weight: 563 lb 14.4 oz (255.783 kg)  well-developed well-nourished female complaining of abdominal pain she is alert and oriented x3. Patient is morbidly obese. Lungs:  Clear but distant breath sounds. Cardiovascular: Regular rate rhythm Abdomen obese shifting dullness is noted with ascites. I am not able to appreciate a liver or spleen Extremities +1 edema Neuro patient's alert oriented otherwise nonfocal     Lab Results  Component Value Date   WBC 7.2 10/07/2012   HGB 11.4* 10/07/2012   HCT 34.9 10/07/2012   PLT 342 10/07/2012   GLUCOSE 103* 10/07/2012   ALT 14 10/07/2012   AST 22 10/07/2012   NA 138 10/07/2012   K 3.6 10/07/2012   CL 98 10/07/2012   CREATININE 0.7 10/07/2012   BUN 8.6 10/07/2012   CO2 29 10/07/2012   INR 1.4 06/28/2008    Pathology: ADDITIONAL INFORMATION: Overall an  endometrioid carcinoma is favored. Microsatellite Instability by PCR: Microsatellite stable ([facility] number: [id number]) Abigail Miyamoto MD Pathologist, Electronic Signature ( Signed 10/15/2012) The tumor block was sent to [facility] for IHC test. No loss of expression of MSH6, PMS2, MLH1 and MSH2 ( [facility] number: [id number]) Abigail Miyamoto MD Pathologist, Electronic Signature ( Signed 10/08/2012) FINAL DIAGNOSIS Diagnosis Omentum, biopsy - METASTATIC ADENOCARCINOMA. PLEASE SEE COMMENT. Microscopic Comment Sections show malignant glandular cells arranged in cribriform and acinar patterns with associated desmoplastic stromal reaction. Associated tumor necrosis is also present. Further immunostains will be performed and an addendum report will follow. The tumor block will be sent out for MSI testing and an addendum report will follow. (HCL:gt, 10/03/12) Abigail Miyamoto MD Pathologist, Electronic Signature (Case signed 10/03/2012) 1 of 2 FINAL for [name], [name] M [phone]) Intraoperative Diagnosis RAPID INTRAOPERATIVE CONSULT, OMENTAL NODULE, FROZEN SECTION - METASTATIC ADENOCARCINOMA. (HCL) Specimen Gross and Clinical Information No results found.  Assessment and Plan: 47 year old female with  #1 prior history of stage IA, grade 2 endometrial  adenocarcinoma diagnosed in March 2009. She underwent robotic hysterectomy bilateral salpingo-oophorectomy with and mini laparotomy for removal of specimen performed in September 2011. Patient subsequently in May 2014 developed peritoneal carcinomatosis and ascites with the pathology positive for glandular adenocarcinoma consistent with recurrent uterine disease. Patient is now seen in medical oncology for discussion of further treatment options. Since patient does have endometrial adenocarcinoma she is a candidate for palliative treatment with Taxol and carboplatinum. This would be given every 3 weeks for a total of 3-6 cycles or beyond. We discussed this today in detail. My hope is to get her started as soon as possible.  #2 patient is a poor IV access and therefore she does need a Port-A-Cath placed. I will ask interventional radiology to do this for [place].  #3 patient also needs to have staging studies performed. I have recommended proceeding with doing CT of the chest abdomen and pelvis for further evaluation and staging. Hopefully we can get this done as soon as possible.  #4 abdominal ascites: Patient is quite uncomfortable from reaccumulation of the ascites and I have recommended proceeding today with a abdominal ultrasound and paracentesis.  #5 patient will need chemotherapy teaching class and we will set this up for her. She understands risks and benefits and side effects of chemotherapy.  All questions were answered I spent 60 minutes total time in the visit greater than 50% of the time face-to-face counseling. Patient knows to call me with any problems questions or concerns and I will plan on seeing her back in one to 2 weeks time for follow  Drue Second, MD Medical/Oncology [facility] [phone] (beeper) [phone] (Office)

## 2012-10-14 NOTE — Progress Notes (Signed)
Checked in new patient. All communications via email only. Didn't ask about POA/living will. No financial issues.

## 2012-10-14 NOTE — Telephone Encounter (Signed)
Per staff phone call and POF I have schedueld appts.  JMW  

## 2012-10-14 NOTE — Patient Instructions (Addendum)
#  1 we discussed her diagnosis of recurrent endometrial cancer.  #2 we will do palliative paracentesis to remove some of the ascitic fluid to help with your breathing and pain. This will be done today and ultrasound.  #3 staging scans to evaluate for extent of disease with CT chest abdomen and pelvis.  #4 to give you chemotherapy we will need a Port-A-Cath placed and this will be ordered with interventional radiology.  #5 you'll need chemotherapy teaching class as soon as possible.  #6 chemotherapy will begin 10/28/2012. Chemotherapy will consist of Taxol and carboplatinum to be given every 3 weeks for a total of 6 cycles. You will receive Neulasta to help keep your white count up after every cycle of chemotherapy.  #7 I will see you back in one week's time for followup.

## 2012-10-14 NOTE — Procedures (Signed)
Successful US guided paracentesis from left lateral abdomen.  Yielded 10 Liters of dark amber fluid.  No immediate complications.  Pt tolerated well.   Specimen was not sent for labs.  Brayton El PA-C 10/14/2012 2:37 PM

## 2012-10-14 NOTE — Telephone Encounter (Signed)
appts made and printed.pt is aware that cs will call her w/ appts for IR and scans...td

## 2012-10-15 ENCOUNTER — Ambulatory Visit (HOSPITAL_COMMUNITY): Payer: BC Managed Care – PPO

## 2012-10-21 ENCOUNTER — Ambulatory Visit (HOSPITAL_COMMUNITY)
Admission: RE | Admit: 2012-10-21 | Discharge: 2012-10-21 | Disposition: A | Discharge status: Home / Self Care | Payer: BC Managed Care – PPO | Source: Ambulatory Visit | Attending: Oncology | Admitting: Oncology

## 2012-10-21 ENCOUNTER — Encounter (HOSPITAL_COMMUNITY): Payer: Self-pay

## 2012-10-21 ENCOUNTER — Other Ambulatory Visit: Payer: BC Managed Care – PPO

## 2012-10-21 ENCOUNTER — Other Ambulatory Visit (HOSPITAL_BASED_OUTPATIENT_CLINIC_OR_DEPARTMENT_OTHER): Payer: BC Managed Care – PPO | Admitting: Lab

## 2012-10-21 ENCOUNTER — Encounter: Payer: Self-pay | Admitting: *Deleted

## 2012-10-21 ENCOUNTER — Ambulatory Visit (HOSPITAL_BASED_OUTPATIENT_CLINIC_OR_DEPARTMENT_OTHER): Payer: BC Managed Care – PPO | Admitting: Oncology

## 2012-10-21 VITALS — BP 130/79 | HR 90 | Temp 97.5°F | Resp 20 | Ht 68.0 in | Wt >= 6400 oz

## 2012-10-21 DIAGNOSIS — R180 Malignant ascites: Secondary | ICD-10-CM

## 2012-10-21 DIAGNOSIS — I517 Cardiomegaly: Secondary | ICD-10-CM | POA: Insufficient documentation

## 2012-10-21 DIAGNOSIS — R188 Other ascites: Secondary | ICD-10-CM | POA: Insufficient documentation

## 2012-10-21 DIAGNOSIS — C549 Malignant neoplasm of corpus uteri, unspecified: Secondary | ICD-10-CM | POA: Insufficient documentation

## 2012-10-21 DIAGNOSIS — C541 Malignant neoplasm of endometrium: Secondary | ICD-10-CM

## 2012-10-21 LAB — CBC WITH DIFFERENTIAL/PLATELET
BASO%: 1 % (ref 0.0–2.0)
Basophils Absolute: 0.1 10*3/uL (ref 0.0–0.1)
EOS%: 2.1 % (ref 0.0–7.0)
HCT: 35.6 % (ref 34.8–46.6)
HGB: 11.4 g/dL — ABNORMAL LOW (ref 11.6–15.9)
MCH: 26.1 pg (ref 25.1–34.0)
MONO#: 0.8 10*3/uL (ref 0.1–0.9)
NEUT%: 78.3 % — ABNORMAL HIGH (ref 38.4–76.8)
RDW: 17.2 % — ABNORMAL HIGH (ref 11.2–14.5)
WBC: 9.4 10*3/uL (ref 3.9–10.3)
lymph#: 1 10*3/uL (ref 0.9–3.3)

## 2012-10-21 LAB — COMPREHENSIVE METABOLIC PANEL (CC13)
ALT: 8 U/L (ref 0–55)
AST: 18 U/L (ref 5–34)
Albumin: 1.9 g/dL — ABNORMAL LOW (ref 3.5–5.0)
Alkaline Phosphatase: 63 U/L (ref 40–150)
BUN: 9.5 mg/dL (ref 7.0–26.0)
CO2: 32 meq/L — ABNORMAL HIGH (ref 22–29)
Calcium: 9.1 mg/dL (ref 8.4–10.4)
Chloride: 94 meq/L — ABNORMAL LOW (ref 98–109)
Creatinine: 0.8 mg/dL (ref 0.6–1.1)
Glucose: 92 mg/dL (ref 70–140)
Potassium: 3.4 meq/L — ABNORMAL LOW (ref 3.5–5.1)
Sodium: 137 meq/L (ref 136–145)
Total Bilirubin: 0.47 mg/dL (ref 0.20–1.20)
Total Protein: 6.6 g/dL (ref 6.4–8.3)

## 2012-10-21 MED ORDER — IOHEXOL 300 MG/ML  SOLN
80.0000 mL | Freq: Once | INTRAMUSCULAR | Status: AC | PRN
  Administered 2012-10-21: 80 mL via INTRAVENOUS

## 2012-10-21 NOTE — Procedures (Signed)
Successful US guided paracentesis from LUQ.  Yielded approximately 12L of dark amber colored fluid.  No immediate complications.  Pt tolerated well.   Specimen was not sent for labs.  Brayton El PA-C 10/21/2012 4:16 PM

## 2012-10-22 ENCOUNTER — Ambulatory Visit (HOSPITAL_COMMUNITY): Payer: BC Managed Care – PPO

## 2012-10-27 ENCOUNTER — Encounter (INDEPENDENT_AMBULATORY_CARE_PROVIDER_SITE_OTHER): Payer: Self-pay | Admitting: Surgery

## 2012-10-27 ENCOUNTER — Encounter: Payer: Self-pay | Admitting: *Deleted

## 2012-10-27 ENCOUNTER — Ambulatory Visit (INDEPENDENT_AMBULATORY_CARE_PROVIDER_SITE_OTHER): Payer: BC Managed Care – PPO | Admitting: Surgery

## 2012-10-27 ENCOUNTER — Telehealth: Payer: Self-pay | Admitting: Medical Oncology

## 2012-10-27 VITALS — BP 130/86 | HR 74 | Temp 98.0°F | Resp 16 | Ht 71.0 in | Wt >= 6400 oz

## 2012-10-27 DIAGNOSIS — C541 Malignant neoplasm of endometrium: Secondary | ICD-10-CM

## 2012-10-27 DIAGNOSIS — C549 Malignant neoplasm of corpus uteri, unspecified: Secondary | ICD-10-CM

## 2012-10-27 NOTE — Telephone Encounter (Signed)
Pt called to inquire whether she needs to come for appt sched tomorrow lab/NP/tx since she does not have port placed yet, also to report "fluid is back and needs to come off. Caryn Bee in radiology will work me in."  Reviewed pts concerns with MD, patient to come to appt sched tomorrow with lab and NP and concerns will be addressed at that time. Pt verbalized understanding and confirmed appt, no further questions at this time, expressed thanks.

## 2012-10-27 NOTE — Progress Notes (Signed)
[facility] Clinical Social Work  Clinical Social Work received phone call from patient requesting assistance with obtaining a motorized wheelchair.  To obtain wheelchair, patient will need MD order for motorized wheelchair evaluation through Outpatient Rehab. CSW encouraged patient to contact her insurance company to determine possible benefits/coverage and then request evaluation through MD.  Kathrin Penner, MSW, LCSW Clinical Social Worker [facility] [phone]

## 2012-10-27 NOTE — Progress Notes (Signed)
Status post diagnostic laparoscopy on 10/03/12. Unfortunately at that time we discovered peritoneal carcinomatosis secondary to metastatic endometrial cancer. We aspirated almost 16 L of ascites. The patient has had 2 subsequent ultrasound-guided aspirations totaling 22 L over 2 weeks. She has met with Dr. Murvin Natal as well as Dr. Welton Flakes. Plans are to begin chemotherapy soon. She comes in today for postop visit and also to discuss port placement.  Her incision seemed to be healing well. There is no obvious sign of infection. No drainage from any of the wounds. Her abdomen remains fairly distended. Her weight has decreased to 548 from 569 lbs.    Due to her size, she Presents some challenges to port placement. I would be extremely reluctant to attempt a blind subclavian stick.. I discussed her situation with Dr. Richarda Overlie in radiology. We discussed ultrasound-guided placement of an internal jugular catheter with tunneling down to a subclavian port. There are some equipment issues due to her size, but he thinks they can get an operating table from the OR with enough weight capacity and can use C-arm fluoroscopy to place the port.  We will schedule this as soon as possible to begin chemotherapy.  She may follow-up with me PRN.  Wilmon Arms. Corliss Skains, MD, [facility]  General/ Trauma Surgery  10/27/2012 10:56 AM

## 2012-10-28 ENCOUNTER — Telehealth: Payer: Self-pay | Admitting: Oncology

## 2012-10-28 ENCOUNTER — Encounter: Payer: Self-pay | Admitting: Adult Health

## 2012-10-28 ENCOUNTER — Other Ambulatory Visit (HOSPITAL_BASED_OUTPATIENT_CLINIC_OR_DEPARTMENT_OTHER): Payer: BC Managed Care – PPO

## 2012-10-28 ENCOUNTER — Ambulatory Visit (HOSPITAL_COMMUNITY)
Admission: RE | Admit: 2012-10-28 | Discharge: 2012-10-28 | Disposition: A | Discharge status: Home / Self Care | Payer: BC Managed Care – PPO | Source: Ambulatory Visit | Attending: Adult Health | Admitting: Adult Health

## 2012-10-28 ENCOUNTER — Ambulatory Visit (HOSPITAL_BASED_OUTPATIENT_CLINIC_OR_DEPARTMENT_OTHER): Payer: BC Managed Care – PPO | Admitting: Adult Health

## 2012-10-28 ENCOUNTER — Telehealth: Payer: Self-pay | Admitting: *Deleted

## 2012-10-28 ENCOUNTER — Ambulatory Visit: Payer: BC Managed Care – PPO

## 2012-10-28 ENCOUNTER — Other Ambulatory Visit: Payer: Self-pay | Admitting: Radiology

## 2012-10-28 VITALS — BP 122/73 | HR 87 | Temp 97.5°F | Resp 20 | Ht 71.0 in | Wt >= 6400 oz

## 2012-10-28 DIAGNOSIS — C541 Malignant neoplasm of endometrium: Secondary | ICD-10-CM

## 2012-10-28 DIAGNOSIS — R188 Other ascites: Secondary | ICD-10-CM

## 2012-10-28 DIAGNOSIS — C549 Malignant neoplasm of corpus uteri, unspecified: Secondary | ICD-10-CM

## 2012-10-28 LAB — CBC WITH DIFFERENTIAL/PLATELET
BASO%: 1 % (ref 0.0–2.0)
EOS%: 2.4 % (ref 0.0–7.0)
Eosinophils Absolute: 0.2 10*3/uL (ref 0.0–0.5)
LYMPH%: 9.7 % — ABNORMAL LOW (ref 14.0–49.7)
MCHC: 32 g/dL (ref 31.5–36.0)
MCV: 81 fL (ref 79.5–101.0)
MONO%: 9.4 % (ref 0.0–14.0)
NEUT#: 6.5 10*3/uL (ref 1.5–6.5)
Platelets: 495 10*3/uL — ABNORMAL HIGH (ref 145–400)
RBC: 4.22 10*6/uL (ref 3.70–5.45)
RDW: 16.8 % — ABNORMAL HIGH (ref 11.2–14.5)

## 2012-10-28 LAB — COMPREHENSIVE METABOLIC PANEL (CC13)
ALT: 11 U/L (ref 0–55)
AST: 20 U/L (ref 5–34)
Albumin: 1.7 g/dL — ABNORMAL LOW (ref 3.5–5.0)
Alkaline Phosphatase: 54 U/L (ref 40–150)
Glucose: 97 mg/dl (ref 70–140)
Potassium: 3.8 mEq/L (ref 3.5–5.1)
Sodium: 137 mEq/L (ref 136–145)
Total Bilirubin: 0.43 mg/dL (ref 0.20–1.20)
Total Protein: 6 g/dL — ABNORMAL LOW (ref 6.4–8.3)

## 2012-10-28 NOTE — Telephone Encounter (Signed)
Per staff message and POF I have scheduled appts.  JMW  

## 2012-10-28 NOTE — Procedures (Signed)
Successful US guided paracentesis from LUQ.  Yielded 10L of dark amber colored fluid.  No immediate complications.  Pt tolerated well.   Specimen was not sent for labs.  Brayton El PA-C 10/28/2012 3:06 PM

## 2012-10-28 NOTE — Progress Notes (Signed)
OFFICE PROGRESS NOTE  CCJoycelyn Rua, MD [address] [place] [address]  DIAGNOSIS: 47 y/o female with recurrent endometrial adenocarcinoma.  PRIOR THERAPY: 1. Stage IA, grade 2 endometrial adenocarcinoma initially diagnosed in March of 2009. Robotic hysterectomy bilateral salpingo-oophorectomy with minilaparotomy for removal of the specimen was performed in September 2011.   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 Omental biopsies were obtained. Pathology was consistent with metastatic adenocarcinoma and the presumption is that this represents recurrent endometrial adenocarcinoma.  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  She was evaluated by Dr. Nelly Rout who recommended chemotherapy with Taxol and Carboplatinum   CURRENT THERAPY: Taxol/Carbo beginning 11/04/12  INTERVAL HISTORY: [name] 47 y.o. female returns for routine follow up today.  She is uncomfortable due to ascites.  She has had an ultrasound guided paracentesis the past two weeks.  Two weeks ago 10 liters was removed, and last week 1 liter was removed.  She has the port placement scheduled for July 10. She is ready to undergo chemotherapy to help decrease the ascites.  She does have some mild reddening of the skin in between her panis and her legs where it rubs together.  They are worried about it becomingOtherwise, a 10 point ROS is neg.   MEDICAL HISTORY: Past Medical History  Diagnosis Date  . Uterine cancer   . Obesity   . DVT (deep venous thrombosis) 2009  . PONV (postoperative nausea and vomiting)   . Swelling     BOTH LEGS  . Rash     LOWER ABD  . Gallstones     ALLERGIES:  has No Known Allergies.  MEDICATIONS:  Current Outpatient Prescriptions  Medication Sig Dispense Refill  . Ascorbic Acid  (VITAMIN C) 1000 MG tablet Take 1,000 mg by mouth daily.      . Cyanocobalamin (VITAMIN B-12 PO) Take 2,500 mg by mouth daily.       . folic acid (FOLVITE) 400 MCG tablet Take 400 mcg by mouth daily.        Marland Kitchen HYDROcodone-acetaminophen (NORCO/VICODIN) 5-325 MG per tablet Take 1 tablet by mouth every 6 (six) hours as needed for pain.      . magnesium gluconate (MAGONATE) 500 MG tablet Take 500 mg by mouth daily.      . Multiple Vitamins-Minerals (MULTIVITAMIN WITH MINERALS) tablet Take 1 tablet by mouth daily.       . naproxen sodium (ANAPROX) 220 MG tablet Take 880 mg by mouth every morning.       . ondansetron (ZOFRAN) 4 MG tablet Take 1 tablet (4 mg total) by mouth every 6 (six) hours.  20 tablet  0  . oxyCODONE-acetaminophen (PERCOCET/ROXICET) 5-325 MG per tablet Take 2 tablets by mouth every 4 (four) hours as needed for pain.  15 tablet  0  . triamterene-hydrochlorothiazide (MAXZIDE-25) 37.5-25 MG per tablet Take 1 tablet by mouth every morning.        No current facility-administered medications for this visit.    SURGICAL HISTORY:  Past Surgical History  Procedure Laterality Date  . Ankle surgery  1950  . Wisdom tooth extraction    . Tonsillectomy and adenoidectomy    . Hysteroscopy w/d&c    . Abdominal hysterectomy  12/2009    [facility], BSO  . Laparoscopy N/A 10/02/2012    Procedure: LAPAROSCOPY DIAGNOSTIC, perocentisis,  omental biopsy;  Surgeon: Wilmon Arms. Tsuei, MD;  Location: [facility];  Service: General;  Laterality: N/A;  removed a total of 15,000 of acities    REVIEW OF SYSTEMS:  General: fatigue (-), night sweats (-), fever (-), pain (-) Lymph: palpable nodes (-) HEENT: vision changes (-), mucositis (-), gum bleeding (-), epistaxis (-) Cardiovascular: chest pain (-), palpitations (-) Pulmonary: shortness of breath (-), dyspnea on exertion (-), cough (-), hemoptysis (-) GI:  Early satiety (-), melena (-), dysphagia (-), nausea/vomiting (-), diarrhea (-) GU: dysuria (-), hematuria  (-), incontinence (-) Musculoskeletal: joint swelling (-), joint pain (-), back pain (-) Neuro: weakness (-), numbness (-), headache (-), confusion (-) Skin: Rash (-), lesions (-), dryness (-) Psych: depression (-), suicidal/homicidal ideation (-), feeling of hopelessness (-)     PHYSICAL EXAMINATION: Blood pressure 122/73, pulse 87, temperature 97.5 F (36.4 C), temperature source Oral, resp. rate 20, height 5\' 11"  (1.803 m), weight 553 lb 14.4 oz (251.247 kg). Body mass index is 77.29 kg/(m^2). General: Patient is an obese, well appearing female  HEENT: PERRLA, sclerae anicteric no conjunctival pallor, MMM Neck: supple, no palpable adenopathy Lungs: clear to auscultation bilaterally, no wheezes, rhonchi, or rales Cardiovascular: regular rate rhythm, S1, S2, no murmurs, rubs or gallops Abdomen: large, Soft, non-tender, distended, normoactive bowel sounds, no HSM-though difficult to palpate due to body habitus Extremities: warm and well perfused, no clubbing, cyanosis, or edema Skin: No rashes or lesions Neuro: Non-focal ECOG PERFORMANCE STATUS: 3 - Symptomatic, >50% confined to bed  LABORATORY DATA: Lab Results  Component Value Date   WBC 8.4 10/28/2012   HGB 10.9* 10/28/2012   HCT 34.2* 10/28/2012   MCV 81.0 10/28/2012   PLT 495* 10/28/2012      Chemistry      Component Value Date/Time   NA 137 10/21/2012 1252   NA 137 10/04/2012 0433   K 3.4* 10/21/2012 1252   K 3.4* 10/04/2012 0433   CL 98 10/07/2012 0923   CL 97 10/04/2012 0433   CO2 32* 10/21/2012 1252   CO2 35* 10/04/2012 0433   BUN 9.5 10/21/2012 1252   BUN 10 10/04/2012 0433   CREATININE 0.8 10/21/2012 1252   CREATININE 0.91 10/04/2012 0433      Component Value Date/Time   CALCIUM 9.1 10/21/2012 1252   CALCIUM 8.5 10/04/2012 0433   ALKPHOS 63 10/21/2012 1252   ALKPHOS 55 09/09/2012 1315   AST 18 10/21/2012 1252   AST 25 09/09/2012 1315   ALT 8 10/21/2012 1252   ALT 17 09/09/2012 1315   BILITOT 0.47 10/21/2012 1252   BILITOT 0.4 09/09/2012  1315       RADIOGRAPHIC STUDIES:  Ct Chest W Contrast  10/21/2012   *RADIOLOGY REPORT*  Clinical Data:  Recurrent endometrial cancer.  CT CHEST, ABDOMEN AND PELVIS WITH CONTRAST  Technique: Contiguous axial images of the chest abdomen and pelvis were obtained after IV contrast administration.  Contrast: 80 ml Omnipaque-300  Comparison: Abdominal pelvic CT of 02/11/2009.  No prior chest CT. Plain film chest of 12/23/2009 is reviewed.  CT CHEST  Findings: Lung windows demonstrate moderate degradation secondary to motion and patient body habitus. No airspace opacities.  Soft tissue windows demonstrate no definite supraclavicular adenopathy.  Mild cardiomegaly, without pericardial or pleural effusion.  No gross mediastinal or hilar adenopathy.  Tiny hiatal hernia.  IMPRESSION:  1.  Moderate degradation secondary to motion and patient body habitus. 2.  Given this factor, no acute process or evidence of metastatic disease  within the chest.  CT ABDOMEN AND PELVIS  Findings:  Moderate to marked degradation involving the abdomen. Nondiagnostic evaluation of the pelvis.  Possible hyperenhancing nodule in the hepatic dome 1.7 cm on image 34/series 2.  Grossly normal spleen, distal stomach.  Pancreas not well evaluated.  Gallbladder not evaluated.  Grossly normal adrenal glands and kidneys.  No gross retroperitoneal abdominal adenopathy.  A moderate amount of abdominal ascites.  No gross bowel obstruction.    Pelvic adenopathy cannot be evaluated.  Moderate volume pelvic free fluid.  No gross osseous abnormality.  IMPRESSION:  1.  Moderate to markedly limited evaluation of the abdomen secondary patient body habitus.  Nondiagnostic evaluation of the pelvis. 2.  Moderate volume abdominal pelvic ascites.  No gross retroperitoneal abdominal adenopathy. 3.  Possible hepatic dome nodule, indeterminate.  Not readily apparent on the prior of 2010.  As the patient is not a candidate for MRI, this could be reevaluated on follow-up  CTs.   Original Report Authenticated By: Jeronimo Greaves, M.D.   Ct Abdomen Pelvis W Contrast  10/21/2012   *RADIOLOGY REPORT*  Clinical Data:  Recurrent endometrial cancer.  CT CHEST, ABDOMEN AND PELVIS WITH CONTRAST  Technique: Contiguous axial images of the chest abdomen and pelvis were obtained after IV contrast administration.  Contrast: 80 ml Omnipaque-300  Comparison: Abdominal pelvic CT of 02/11/2009.  No prior chest CT. Plain film chest of 12/23/2009 is reviewed.  CT CHEST  Findings: Lung windows demonstrate moderate degradation secondary to motion and patient body habitus. No airspace opacities.  Soft tissue windows demonstrate no definite supraclavicular adenopathy.  Mild cardiomegaly, without pericardial or pleural effusion.  No gross mediastinal or hilar adenopathy.  Tiny hiatal hernia.  IMPRESSION:  1.  Moderate degradation secondary to motion and patient body habitus. 2.  Given this factor, no acute process or evidence of metastatic disease within the chest.  CT ABDOMEN AND PELVIS  Findings:  Moderate to marked degradation involving the abdomen. Nondiagnostic evaluation of the pelvis.  Possible hyperenhancing nodule in the hepatic dome 1.7 cm on image 34/series 2.  Grossly normal spleen, distal stomach.  Pancreas not well evaluated.  Gallbladder not evaluated.  Grossly normal adrenal glands and kidneys.  No gross retroperitoneal abdominal adenopathy.  A moderate amount of abdominal ascites.  No gross bowel obstruction.    Pelvic adenopathy cannot be evaluated.  Moderate volume pelvic free fluid.  No gross osseous abnormality.  IMPRESSION:  1.  Moderate to markedly limited evaluation of the abdomen secondary patient body habitus.  Nondiagnostic evaluation of the pelvis. 2.  Moderate volume abdominal pelvic ascites.  No gross retroperitoneal abdominal adenopathy. 3.  Possible hepatic dome nodule, indeterminate.  Not readily apparent on the prior of 2010.  As the patient is not a candidate for MRI, this  could be reevaluated on follow-up CTs.   Original Report Authenticated By: Jeronimo Greaves, M.D.   US Paracentesis  10/14/2012   *RADIOLOGY REPORT*  Clinical Data: Recurrent uterine cancer, abdominal distension and discomfort secondary to ascites.  Request for therapeutic paracentesis.  ULTRASOUND GUIDED PARACENTESIS  Comparison:  None  An ultrasound guided paracentesis was thoroughly discussed with the patient and questions answered.  The benefits, risks, alternatives and complications were also discussed.  The patient understands and wishes to proceed with the procedure.  Written consent was obtained.  Ultrasound was performed to localize and mark an adequate pocket of fluid in the left lateral quadrant of the abdomen.  The area was then prepped and draped in  the normal sterile fashion.  1% Lidocaine was used for local anesthesia.  Under ultrasound guidance a 19 gauge Yueh catheter was introduced.  Paracentesis was performed.  The catheter was removed and a dressing applied.  Complications:  None  Findings:  A total of approximately 10 liters of dark amber colored fluid was removed.  A fluid sample was not sent for laboratory analysis.  IMPRESSION: Successful ultrasound guided paracentesis yielding 10 liters of ascites.  Read by Brayton El PA-C   Original Report Authenticated By: Judie Petit. Shick, M.D.    ASSESSMENT:  Patient is a 47 year old female diagnosed with recurrent endometrial carcinoma.  She was previously diagnosed in March 2009 with stage IA grade 2 endometrial adenocarcinoma for which robotic hysterectomy, and BSO with minilaparotomy was performed in 12/2009.  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  She will now proceed with Taxol/Carbo chemotherapy after port placement and chemotherapy class.    PLAN:   1. Doing well.  Patient will  undergo port placement Thursday 10/30/12 and chemotherapy 11/04/12.  2. She will have ultrasound guided paracentesis today.    3. We discussed care of her intertriginous folds.  She will apply desitin and will notify me of any erythema, tenderness, breakdown, drainage.    4. She will return next week for an appt and chemotherapy.     All questions were answered. The patient knows to call the clinic with any problems, questions or concerns. We can certainly see the patient much sooner if necessary.  I spent 25 minutes counseling the patient face to face. The total time spent in the appointment was 30 minutes.   Cherie Ouch Lyn Hollingshead, NP Medical Oncology [facility] Phone: [phone] 10/28/2012, 11:29 AM

## 2012-10-28 NOTE — Patient Instructions (Addendum)
We will change the schedule of your chemotherapy.  Use desitin in the area between your abdomen and legs.  Monitor closely for any increased redness, tenderness, warmth, or drainage.  We will see you back next week.  Please call us if you have any questions or concerns.

## 2012-10-29 ENCOUNTER — Ambulatory Visit: Payer: BC Managed Care – PPO

## 2012-10-29 ENCOUNTER — Telehealth: Payer: Self-pay | Admitting: Medical Oncology

## 2012-10-29 ENCOUNTER — Encounter (HOSPITAL_COMMUNITY): Payer: Self-pay | Admitting: Pharmacy Technician

## 2012-10-29 ENCOUNTER — Other Ambulatory Visit: Payer: Self-pay | Admitting: Radiology

## 2012-10-29 MED ORDER — LIDOCAINE-PRILOCAINE 2.5-2.5 % EX CREA: TOPICAL_CREAM | CUTANEOUS | Status: DC | PRN

## 2012-10-29 NOTE — Telephone Encounter (Signed)
Patient called requesting appt for paracentesis prior to chemo treatment on 07/15. Catherine Schooling, NP informed, orders placed, radiology sched to call pt with appt. Patient informed and expressed verbal understanding. Also requesting for emla cream for PAC to be sent to pharmacy.   No further questions at this time, pt knows to call office with any questions or concerns.

## 2012-10-30 ENCOUNTER — Inpatient Hospital Stay (HOSPITAL_COMMUNITY): Admission: RE | Admit: 2012-10-30 | Payer: BC Managed Care – PPO | Source: Ambulatory Visit

## 2012-10-31 ENCOUNTER — Other Ambulatory Visit: Payer: Self-pay | Admitting: Gynecologic Oncology

## 2012-10-31 ENCOUNTER — Other Ambulatory Visit: Payer: Self-pay | Admitting: Radiology

## 2012-10-31 ENCOUNTER — Encounter (HOSPITAL_COMMUNITY): Payer: Self-pay

## 2012-10-31 ENCOUNTER — Ambulatory Visit (HOSPITAL_COMMUNITY)
Admission: RE | Admit: 2012-10-31 | Discharge: 2012-10-31 | Disposition: A | Discharge status: Home / Self Care | Payer: BC Managed Care – PPO | Source: Ambulatory Visit | Attending: Surgery | Admitting: Surgery

## 2012-10-31 ENCOUNTER — Other Ambulatory Visit (INDEPENDENT_AMBULATORY_CARE_PROVIDER_SITE_OTHER): Payer: Self-pay | Admitting: Surgery

## 2012-10-31 DIAGNOSIS — C549 Malignant neoplasm of corpus uteri, unspecified: Secondary | ICD-10-CM | POA: Insufficient documentation

## 2012-10-31 DIAGNOSIS — C541 Malignant neoplasm of endometrium: Secondary | ICD-10-CM

## 2012-10-31 DIAGNOSIS — R188 Other ascites: Secondary | ICD-10-CM | POA: Insufficient documentation

## 2012-10-31 DIAGNOSIS — Z79899 Other long term (current) drug therapy: Secondary | ICD-10-CM | POA: Insufficient documentation

## 2012-10-31 DIAGNOSIS — Z86718 Personal history of other venous thrombosis and embolism: Secondary | ICD-10-CM | POA: Insufficient documentation

## 2012-10-31 DIAGNOSIS — E669 Obesity, unspecified: Secondary | ICD-10-CM | POA: Insufficient documentation

## 2012-10-31 DIAGNOSIS — C786 Secondary malignant neoplasm of retroperitoneum and peritoneum: Secondary | ICD-10-CM | POA: Insufficient documentation

## 2012-10-31 LAB — BASIC METABOLIC PANEL
Calcium: 8.3 mg/dL — ABNORMAL LOW (ref 8.4–10.5)
GFR calc Af Amer: >90 mL/min (ref >90)
GFR calc non Af Amer: >90 mL/min (ref >90)
Glucose, Bld: 93 mg/dL (ref 70–99)
Potassium: 3.5 mEq/L (ref 3.5–5.1)
Sodium: 135 mEq/L (ref 135–145)

## 2012-10-31 LAB — CBC
Hemoglobin: 10.6 g/dL — ABNORMAL LOW (ref 12.0–15.0)
Platelets: 506 10*3/uL — ABNORMAL HIGH (ref 150–400)
RBC: 4.23 MIL/uL (ref 3.87–5.11)
WBC: 8 10*3/uL (ref 4.0–10.5)

## 2012-10-31 LAB — APTT: aPTT: 32 seconds (ref 24–37)

## 2012-10-31 LAB — PROTIME-INR
INR: 1.13 (ref 0.00–1.49)
Prothrombin Time: 14.3 seconds (ref 11.6–15.2)

## 2012-10-31 MED ORDER — HEPARIN SOD (PORK) LOCK FLUSH 100 UNIT/ML IV SOLN: 500.0000 [IU] | Freq: Once | INTRAVENOUS | Status: DC

## 2012-10-31 MED ORDER — DEXTROSE 5 % IV SOLN
3.0000 g | INTRAVENOUS | Status: DC
  Filled 2012-10-31: qty 3000

## 2012-10-31 MED ORDER — MIDAZOLAM HCL 2 MG/2ML IJ SOLN
INTRAMUSCULAR | Status: AC
  Filled 2012-10-31: qty 6

## 2012-10-31 MED ORDER — MIDAZOLAM HCL 2 MG/2ML IJ SOLN
INTRAMUSCULAR | Status: AC | PRN
  Administered 2012-10-31 (×2): 1 mg via INTRAVENOUS
  Administered 2012-10-31: 2 mg via INTRAVENOUS

## 2012-10-31 MED ORDER — CEFAZOLIN SODIUM-DEXTROSE 2-3 GM-% IV SOLR
INTRAVENOUS | Status: AC
  Administered 2012-10-31: 3000 mg
  Filled 2012-10-31: qty 50

## 2012-10-31 MED ORDER — FENTANYL CITRATE 0.05 MG/ML IJ SOLN
INTRAMUSCULAR | Status: AC
  Filled 2012-10-31: qty 6

## 2012-10-31 MED ORDER — LIDOCAINE HCL 1 % IJ SOLN
INTRAMUSCULAR | Status: AC
  Filled 2012-10-31: qty 20

## 2012-10-31 MED ORDER — CEFAZOLIN SODIUM 1-5 GM-% IV SOLN
INTRAVENOUS | Status: AC
  Filled 2012-10-31: qty 50

## 2012-10-31 MED ORDER — FENTANYL CITRATE 0.05 MG/ML IJ SOLN
INTRAMUSCULAR | Status: AC | PRN
  Administered 2012-10-31 (×2): 100 ug via INTRAVENOUS

## 2012-10-31 MED ORDER — SODIUM CHLORIDE 0.9 % IV SOLN
Freq: Once | INTRAVENOUS | Status: AC
  Administered 2012-10-31: 20 mL/h via INTRAVENOUS

## 2012-10-31 NOTE — Procedures (Signed)
Procedure:  Right IJ porta-cath placement Findings:  SL PAC via right IJ vein with cath tip at CAJ.  No PTX.  OK to use.

## 2012-10-31 NOTE — H&P (Signed)
Chief Complaint: "I'm here for a port" Referring Physician:Tsuei HPI: [name] is an 47 y.o. female with recurrent endometrial cancer. She has had frequent paracentesis due to ascites. She is to begin chemotherapy and is here today for port placement. She feels well, no recent fevers, illness. PMHx and meds reviewed.  Past Medical History:  Past Medical History  Diagnosis Date  . Uterine cancer   . Obesity   . DVT (deep venous thrombosis) 2009  . PONV (postoperative nausea and vomiting)   . Swelling     BOTH LEGS  . Rash     LOWER ABD  . Gallstones     Past Surgical History:  Past Surgical History  Procedure Laterality Date  . Ankle surgery  1950  . Wisdom tooth extraction    . Tonsillectomy and adenoidectomy    . Hysteroscopy w/d&c    . Abdominal hysterectomy  12/2009    [facility], BSO  . Laparoscopy N/A 10/02/2012    Procedure: LAPAROSCOPY DIAGNOSTIC, perocentisis, omental biopsy;  Surgeon: Wilmon Arms. Corliss Skains, MD;  Location: [facility];  Service: General;  Laterality: N/A;  removed a total of 15,000 of acities    Family History:  Family History  Problem Relation Age of Onset  . Heart disease Father   . Diabetes Brother   . Heart disease Mother     Social History:  reports that she has never smoked. She does not have any smokeless tobacco history on file. She reports that  drinks alcohol. She reports that she does not use illicit drugs.  Allergies: No Known Allergies  Medications: Ascorbic Acid (VITAMIN C) 1000 MG tablet (Taking) Sig - Route: Take 1,000 mg by mouth daily. - Oral Class: Historical Med Number of times this order has been changed since signing: 1 [address] Cyanocobalamin (VITAMIN B-12 PO) (Taking) Sig - Route: Take 2,500 mg by mouth daily. - Oral Class: Historical Med Number of times this order has been changed since signing: 3 Order [address] folic acid (FOLVITE) 400 MCG tablet (Taking) Sig - Route: Take 400 mcg by mouth daily. - Oral Class: Historical  Med Number of times this order has been changed since signing: 3 Order [address] HYDROcodone-acetaminophen (NORCO/VICODIN) 5-325 MG per tablet (Taking) Sig - Route: Take 1 tablet by mouth every 6 (six) hours as needed for pain. - Oral Class: Historical Med Number of times this order has been changed since signing: 1 Order [address] magnesium gluconate (MAGONATE) 500 MG tablet (Taking) Sig - Route: Take 500 mg by mouth daily. - Oral Class: Historical Med Number of times this order has been changed since signing: 2 Order [address] Multiple Vitamins-Minerals (MULTIVITAMIN WITH MINERALS) tablet (Taking) Sig - Route: Take 1 tablet by mouth daily. - Oral Class: Historical Med Number of times this order has been changed since signing: 4 Order [address] naproxen sodium (ANAPROX) 220 MG tablet (Taking) Sig - Route: Take 880 mg by mouth every morning. - Oral Class: Historical Med Number of times this order has been changed since signing: 3 Order [address] triamterene-hydrochlorothiazide (MAXZIDE-25) 37.5-25 MG per tablet (Taking) Sig - Route: Take 1 tablet by mouth every morning. - Oral Class: Historical Med Number of times this order has been changed since signing: 4 Order [address] ondansetron (ZOFRAN) 4 MG tablet (Taking/Discontinued) 20 tablet 0 09/04/2012 10/29/2012 Sig - Route: Take 1 tablet (4 mg total) by mouth every 6 (six) hours. - Oral Class: Print Reason for Discontinue: Entry Error Number of  times this order has been changed since signing: 2 Order [address] lidocaine-prilocaine (EMLA) cream 30 g 2 10/29/2012 Sig - Route: Apply topically as needed. Apply to port-a-cath site 1-2 hours prior to treatment    Please HPI for pertinent positives, otherwise complete 10 system ROS negative.  Physical Exam: BP 114/66  Pulse 90  Temp(Src) 97.5 F (36.4 C) (Oral)  Resp 20  Ht 5\' 11"  (1.803 m)  Wt 553 lb (250.839 kg)  BMI 77.16 kg/m2  SpO2 95% Body mass index is 77.16 kg/(m^2).   General Appearance:   Alert, cooperative, no distress, appears stated age  Head:  Normocephalic, without obvious abnormality, atraumatic  ENT: Unremarkable  Neck: Supple, symmetrical, trachea midline  Lungs:   Clear to auscultation bilaterally, no w/r/r  Chest Wall:  No tenderness or deformity  Heart:  Regular rate and rhythm, S1, S2 normal, no murmur, rub or gallop.  Abdomen:   Soft, non-tender, distended.  Neurologic: Normal affect, no gross deficits.   Results for orders placed during the hospital encounter of 10/31/12 (from the past 48 hour(s))  APTT     Status: None   Collection Time    10/31/12  6:55 AM      Result Value Range   aPTT 32  24 - 37 seconds  BASIC METABOLIC PANEL     Status: Abnormal   Collection Time    10/31/12  6:55 AM      Result Value Range   Sodium 135  135 - 145 mEq/L   Potassium 3.5  3.5 - 5.1 mEq/L   Chloride 97  96 - 112 mEq/L   CO2 29  19 - 32 mEq/L   Glucose, Bld 93  70 - 99 mg/dL   BUN 8  6 - 23 mg/dL   Creatinine, Ser 4.09  0.50 - 1.10 mg/dL   Calcium 8.3 (*) 8.4 - 10.5 mg/dL   GFR calc non Af Amer >90  >90 mL/min   GFR calc Af Amer >90  >90 mL/min   Comment:            The eGFR has been calculated     using the CKD EPI equation.     This calculation has not been     validated in all clinical     situations.     eGFR's persistently     <90 mL/min signify     possible Chronic Kidney Disease.  CBC     Status: Abnormal   Collection Time    10/31/12  6:55 AM      Result Value Range   WBC 8.0  4.0 - 10.5 K/uL   RBC 4.23  3.87 - 5.11 MIL/uL   Hemoglobin 10.6 (*) 12.0 - 15.0 g/dL   HCT 81.1 (*) 91.4 - 78.2 %   MCV 83.0  78.0 - 100.0 fL   MCH 25.1 (*) 26.0 - 34.0 pg   MCHC 30.2  30.0 - 36.0 g/dL   RDW 95.6 (*) 21.3 - 08.6 %   Platelets 506 (*) 150 - 400 K/uL  PROTIME-INR     Status: None   Collection Time    10/31/12  6:55 AM      Result Value Range   Prothrombin Time 14.3  11.6 - 15.2 seconds   INR 1.13  0.00 - 1.49   No results  found.  Assessment/Plan Recurrent endometrial cancer Discussed port placement Explained procedure, risks, complications, use of sedation Labs reviewed, ok Consent signed in chart  Roselynne Lortz,  Favor Kreh PA-C 10/31/2012, 8:04 AM

## 2012-10-31 NOTE — H&P (Signed)
Agree 

## 2012-11-02 NOTE — Progress Notes (Signed)
OFFICE PROGRESS NOTE  CC  Catherine Rua, MD [address] [place] [address] Dr. Laurette Schimke  DIAGNOSIS: 47 year old female with prior history of stage I a uterine cancer diagnosed in 2010. Now with recurrent disease diagnosed in May 2014   PRIOR THERAPY:  #1Patient subsequently developed excessive uterine bleeding. Bad tablet to dilatation and curettage with hysteroscopy. 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 Due to morbid obesity Mirena IUD was placed along with the addition of Aygestin. Patient had a dramatic directed weight loss with nutritional support she went from 523 pounds to 359 pounds in July of 2011. Patient continued to do well and is 12/27/2009 she at that time underwent a robotic-assisted laparoscopic hysterectomy, bilateral salpingo-oophorectomy, and mini laparotomy through the umbilical port with morcellation of uterus within about the delivery of the uterus. The final pathology did reveal an endometrial adenocarcinoma grade 2 with invasion limited to 1 mm of the myometrium  #2continued to do well until June 2014 when she developed right upper quadrant pain. It was presumed to be cholelithiasis. 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 Omental biopsies were obtained. The pathology was consistent with metastatic adenocarcinoma in the presumption of recurrent endometrial carcinoma was performed made. She was seen by Dr. Laurette Schimke on 10/07/2012. She was recommended that she undergo chemotherapy consisting of Taxol and carboplatinum  #3 abdominal ascites: Multiple paracenteses  CURRENT THERAPY:patient will begin Taxol carboplatinum once we have a Port-A-Cath placed  INTERVAL HISTORY: [name] 47 y.o. female returns for followup visit today. She up to date has not been able to have a  Port-A-Cath placed to to mechanical issues. I have discussed the case with Dr. Corliss Skains, patient's surgeon. He feels that the patient will not be able to get a Port-A-Cath surgically because of her size. He will discuss the case with interventional radiology to see if they can do it. She again feels like her abdomen is quite full from her ascites. She hopes that we can get her another paracentesis performed. She feels tired weak and fatigued. Patient and I did discuss her radiology findings today.  MEDICAL HISTORY: Past Medical History  Diagnosis Date  . Uterine cancer   . Obesity   . DVT (deep venous thrombosis) 2009  . PONV (postoperative nausea and vomiting)   . Swelling     BOTH LEGS  . Rash     LOWER ABD  . Gallstones     ALLERGIES:  has No Known Allergies.  MEDICATIONS:  Current Outpatient Prescriptions  Medication Sig Dispense Refill  . Ascorbic Acid (VITAMIN C) 1000 MG tablet Take 1,000 mg by mouth daily.      . Cyanocobalamin (VITAMIN B-12 PO) Take 2,500 mg by mouth daily.       . folic acid (FOLVITE) 400 MCG tablet Take 400 mcg by mouth daily.        Marland Kitchen HYDROcodone-acetaminophen (NORCO/VICODIN) 5-325 MG per tablet Take 1 tablet by mouth every 6 (six) hours as needed for pain.      . magnesium gluconate (MAGONATE) 500 MG tablet Take 500 mg by mouth daily.      . Multiple Vitamins-Minerals (MULTIVITAMIN WITH MINERALS) tablet Take 1 tablet by mouth daily.       . naproxen sodium (ANAPROX) 220 MG tablet Take 880 mg by mouth every morning.       Marland Kitchen  triamterene-hydrochlorothiazide (MAXZIDE-25) 37.5-25 MG per tablet Take 1 tablet by mouth every morning.       . lidocaine-prilocaine (EMLA) cream Apply topically as needed. Apply to port-a-cath site 1-2 hours prior to treatment.  30 g  2  . ondansetron (ZOFRAN) 4 MG tablet Take 4 mg by mouth every 8 (eight) hours as needed for nausea.       No current facility-administered medications for this visit.    SURGICAL HISTORY:  Past Surgical  History  Procedure Laterality Date  . Ankle surgery  1950  . Wisdom tooth extraction    . Tonsillectomy and adenoidectomy    . Hysteroscopy w/d&c    . Abdominal hysterectomy  12/2009    [facility], [facility]  . Laparoscopy N/A 10/02/2012    Procedure: LAPAROSCOPY DIAGNOSTIC, perocentisis, omental biopsy;  Surgeon: Wilmon Arms. Corliss Skains, MD;  Location: [facility];  Service: General;  Laterality: N/A;  removed a total of 15,000 of acities    REVIEW OF SYSTEMS:  Pertinent items are noted in HPI.   HEALTH MAINTENANCE:  PHYSICAL EXAMINATION: Blood pressure 130/79, pulse 90, temperature 97.5 F (36.4 C), temperature source Oral, resp. rate 20, height 5\' 8"  (1.727 m), weight 560 lb 14.4 oz (254.423 kg). Body mass index is 85.3 kg/(m^2). ECOG PERFORMANCE STATUS: 2 - Symptomatic, <50% confined to bed   General appearance: alert, appears older than stated age and fatigued Resp: clear to auscultation bilaterally Cardio: regular rate and rhythm GI: Abdomen is obese with ascites and shifting don't mass Extremities: extremities normal, atraumatic, no cyanosis or edema Neurologic: Grossly normal   LABORATORY DATA: Lab Results  Component Value Date   WBC 8.0 10/31/2012   HGB 10.6* 10/31/2012   HCT 35.1* 10/31/2012   MCV 83.0 10/31/2012   PLT 506* 10/31/2012      Chemistry      Component Value Date/Time   NA 135 10/31/2012 0655   NA 137 10/28/2012 1057   K 3.5 10/31/2012 0655   K 3.8 10/28/2012 1057   CL 97 10/31/2012 0655   CL 98 10/07/2012 0923   CO2 29 10/31/2012 0655   CO2 31* 10/28/2012 1057   BUN 8 10/31/2012 0655   BUN 8.5 10/28/2012 1057   CREATININE 0.78 10/31/2012 0655   CREATININE 0.8 10/28/2012 1057      Component Value Date/Time   CALCIUM 8.3* 10/31/2012 0655   CALCIUM 8.6 10/28/2012 1057   ALKPHOS 54 10/28/2012 1057   ALKPHOS 55 09/09/2012 1315   AST 20 10/28/2012 1057   AST 25 09/09/2012 1315   ALT 11 10/28/2012 1057   ALT 17 09/09/2012 1315   BILITOT 0.43 10/28/2012 1057   BILITOT 0.4 09/09/2012 1315        RADIOGRAPHIC STUDIES:  Ct Chest W Contrast  10/21/2012   *RADIOLOGY REPORT*  Clinical Data:  Recurrent endometrial cancer.  CT CHEST, ABDOMEN AND PELVIS WITH CONTRAST  Technique: Contiguous axial images of the chest abdomen and pelvis were obtained after IV contrast administration.  Contrast: 80 ml Omnipaque-300  Comparison: Abdominal pelvic CT of 02/11/2009.  No prior chest CT. Plain film chest of 12/23/2009 is reviewed.  CT CHEST  Findings: Lung windows demonstrate moderate degradation secondary to motion and patient body habitus. No airspace opacities.  Soft tissue windows demonstrate no definite supraclavicular adenopathy.  Mild cardiomegaly, without pericardial or pleural effusion.  No gross mediastinal or hilar adenopathy.  Tiny hiatal hernia.  IMPRESSION:  1.  Moderate degradation secondary to motion and patient body habitus. 2.  Given  this factor, no acute process or evidence of metastatic disease within the chest.  CT ABDOMEN AND PELVIS  Findings:  Moderate to marked degradation involving the abdomen. Nondiagnostic evaluation of the pelvis.  Possible hyperenhancing nodule in the hepatic dome 1.7 cm on image 34/series 2.  Grossly normal spleen, distal stomach.  Pancreas not well evaluated.  Gallbladder not evaluated.  Grossly normal adrenal glands and kidneys.  No gross retroperitoneal abdominal adenopathy.  A moderate amount of abdominal ascites.  No gross bowel obstruction.    Pelvic adenopathy cannot be evaluated.  Moderate volume pelvic free fluid.  No gross osseous abnormality.  IMPRESSION:  1.  Moderate to markedly limited evaluation of the abdomen secondary patient body habitus.  Nondiagnostic evaluation of the pelvis. 2.  Moderate volume abdominal pelvic ascites.  No gross retroperitoneal abdominal adenopathy. 3.  Possible hepatic dome nodule, indeterminate.  Not readily apparent on the prior of 2010.  As the patient is not a candidate for MRI, this could be reevaluated on follow-up CTs.    Original Report Authenticated By: Jeronimo Greaves, M.D.   Ct Abdomen Pelvis W Contrast  10/21/2012   *RADIOLOGY REPORT*  Clinical Data:  Recurrent endometrial cancer.  CT CHEST, ABDOMEN AND PELVIS WITH CONTRAST  Technique: Contiguous axial images of the chest abdomen and pelvis were obtained after IV contrast administration.  Contrast: 80 ml Omnipaque-300  Comparison: Abdominal pelvic CT of 02/11/2009.  No prior chest CT. Plain film chest of 12/23/2009 is reviewed.  CT CHEST  Findings: Lung windows demonstrate moderate degradation secondary to motion and patient body habitus. No airspace opacities.  Soft tissue windows demonstrate no definite supraclavicular adenopathy.  Mild cardiomegaly, without pericardial or pleural effusion.  No gross mediastinal or hilar adenopathy.  Tiny hiatal hernia.  IMPRESSION:  1.  Moderate degradation secondary to motion and patient body habitus. 2.  Given this factor, no acute process or evidence of metastatic disease within the chest.  CT ABDOMEN AND PELVIS  Findings:  Moderate to marked degradation involving the abdomen. Nondiagnostic evaluation of the pelvis.  Possible hyperenhancing nodule in the hepatic dome 1.7 cm on image 34/series 2.  Grossly normal spleen, distal stomach.  Pancreas not well evaluated.  Gallbladder not evaluated.  Grossly normal adrenal glands and kidneys.  No gross retroperitoneal abdominal adenopathy.  A moderate amount of abdominal ascites.  No gross bowel obstruction.    Pelvic adenopathy cannot be evaluated.  Moderate volume pelvic free fluid.  No gross osseous abnormality.  IMPRESSION:  1.  Moderate to markedly limited evaluation of the abdomen secondary patient body habitus.  Nondiagnostic evaluation of the pelvis. 2.  Moderate volume abdominal pelvic ascites.  No gross retroperitoneal abdominal adenopathy. 3.  Possible hepatic dome nodule, indeterminate.  Not readily apparent on the prior of 2010.  As the patient is not a candidate for MRI, this could be  reevaluated on follow-up CTs.   Original Report Authenticated By: Jeronimo Greaves, M.D.   US Paracentesis  10/28/2012   *RADIOLOGY REPORT*  Clinical Data: Recurrent uterine cancer, abdominal distension and discomfort secondary to ascites.  ULTRASOUND GUIDED PARACENTESIS  Comparison:  Previous paracentesis  An ultrasound guided paracentesis was thoroughly discussed with the patient and questions answered.  The benefits, risks, alternatives and complications were also discussed.  The patient understands and wishes to proceed with the procedure.  Written consent was obtained.  Ultrasound was performed to localize and mark an adequate pocket of fluid in the left upper quadrant of the abdomen.  The area  was then prepped and draped in the normal sterile fashion.  1% Lidocaine was used for local anesthesia.  Under ultrasound guidance a 19 gauge Yueh catheter was introduced.  Paracentesis was performed.  The catheter was removed and a dressing applied.  Complications:  None  Findings:  A total of approximately 10 liters of dark amber-colored fluid was removed.  A fluid sample was not sent for laboratory analysis.  IMPRESSION: Successful ultrasound guided paracentesis yielding 10 liters of ascites.  Read by Brayton El PA-C   Original Report Authenticated By: Richarda Overlie, M.D.   US Paracentesis  10/14/2012   *RADIOLOGY REPORT*  Clinical Data: Recurrent uterine cancer, abdominal distension and discomfort secondary to ascites.  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 Patient will be treated with Taxol and carboplatinum palliatively. However we do need to get a Port-A-Cath placed. Hopefully this can be placed in the next few weeks.  #2 we did discuss patient's radiology findings.  #3 ascites: Patient will proceed with paracentesis today for symptomatic management.   PLAN:   #1 Port-A-Cath placement  #2 once port is  placed she will proceed with chemotherapy hopefully in the next one to 2 weeks.  #3 paracentesis today.   All questions were answered. The patient knows to call the clinic with any problems, questions or concerns. We can certainly see the patient much sooner if necessary.  I spent 25 minutes counseling the patient face to face. The total time spent in the appointment was 30 minutes.    Drue Second, MD Medical/Oncology [facility] [phone] (beeper) [phone] (Office)

## 2012-11-03 ENCOUNTER — Ambulatory Visit (HOSPITAL_COMMUNITY)
Admission: RE | Admit: 2012-11-03 | Discharge: 2012-11-03 | Disposition: A | Discharge status: Home / Self Care | Payer: BC Managed Care – PPO | Source: Ambulatory Visit | Attending: Gynecologic Oncology | Admitting: Gynecologic Oncology

## 2012-11-03 VITALS — BP 124/65

## 2012-11-03 DIAGNOSIS — R188 Other ascites: Secondary | ICD-10-CM

## 2012-11-03 DIAGNOSIS — C549 Malignant neoplasm of corpus uteri, unspecified: Secondary | ICD-10-CM | POA: Insufficient documentation

## 2012-11-03 DIAGNOSIS — R109 Unspecified abdominal pain: Secondary | ICD-10-CM | POA: Insufficient documentation

## 2012-11-03 DIAGNOSIS — C541 Malignant neoplasm of endometrium: Secondary | ICD-10-CM

## 2012-11-03 NOTE — Procedures (Signed)
US guided therapeutic paracentesis performed yielding 9.3 liters amber fluid. No immediate complications.

## 2012-11-04 ENCOUNTER — Ambulatory Visit (HOSPITAL_BASED_OUTPATIENT_CLINIC_OR_DEPARTMENT_OTHER): Payer: BC Managed Care – PPO | Admitting: Adult Health

## 2012-11-04 ENCOUNTER — Other Ambulatory Visit (HOSPITAL_BASED_OUTPATIENT_CLINIC_OR_DEPARTMENT_OTHER): Payer: BC Managed Care – PPO | Admitting: Lab

## 2012-11-04 ENCOUNTER — Encounter: Payer: Self-pay | Admitting: Adult Health

## 2012-11-04 ENCOUNTER — Ambulatory Visit (HOSPITAL_BASED_OUTPATIENT_CLINIC_OR_DEPARTMENT_OTHER): Payer: BC Managed Care – PPO

## 2012-11-04 ENCOUNTER — Telehealth: Payer: Self-pay | Admitting: *Deleted

## 2012-11-04 VITALS — BP 108/72 | HR 87 | Temp 97.2°F | Resp 18 | Ht 71.0 in | Wt >= 6400 oz

## 2012-11-04 VITALS — BP 134/72 | HR 84 | Temp 99.0°F | Resp 20

## 2012-11-04 DIAGNOSIS — C549 Malignant neoplasm of corpus uteri, unspecified: Secondary | ICD-10-CM

## 2012-11-04 DIAGNOSIS — C541 Malignant neoplasm of endometrium: Secondary | ICD-10-CM

## 2012-11-04 DIAGNOSIS — Z5111 Encounter for antineoplastic chemotherapy: Secondary | ICD-10-CM

## 2012-11-04 DIAGNOSIS — R188 Other ascites: Secondary | ICD-10-CM

## 2012-11-04 LAB — CBC WITH DIFFERENTIAL/PLATELET
Basophils Absolute: 0 10*3/uL (ref 0.0–0.1)
EOS%: 2.7 % (ref 0.0–7.0)
Eosinophils Absolute: 0.2 10*3/uL (ref 0.0–0.5)
HGB: 10.5 g/dL — ABNORMAL LOW (ref 11.6–15.9)
MCH: 25.1 pg (ref 25.1–34.0)
NEUT#: 6 10*3/uL (ref 1.5–6.5)
RDW: 16.4 % — ABNORMAL HIGH (ref 11.2–14.5)
WBC: 7.9 10*3/uL (ref 3.9–10.3)
lymph#: 0.8 10*3/uL — ABNORMAL LOW (ref 0.9–3.3)

## 2012-11-04 LAB — COMPREHENSIVE METABOLIC PANEL (CC13)
Albumin: 1.5 g/dL — ABNORMAL LOW (ref 3.5–5.0)
Alkaline Phosphatase: 56 U/L (ref 40–150)
Glucose: 105 mg/dl (ref 70–140)
Potassium: 3.8 mEq/L (ref 3.5–5.1)
Sodium: 139 mEq/L (ref 136–145)
Total Protein: 5.3 g/dL — ABNORMAL LOW (ref 6.4–8.3)

## 2012-11-04 MED ORDER — DEXAMETHASONE SODIUM PHOSPHATE 20 MG/5ML IJ SOLN
20.0000 mg | Freq: Once | INTRAMUSCULAR | Status: AC
  Administered 2012-11-04: 20 mg via INTRAVENOUS

## 2012-11-04 MED ORDER — HEPARIN SOD (PORK) LOCK FLUSH 100 UNIT/ML IV SOLN
500.0000 [IU] | Freq: Once | INTRAVENOUS | Status: AC | PRN
  Administered 2012-11-04: 500 [IU]
  Filled 2012-11-04: qty 5

## 2012-11-04 MED ORDER — ONDANSETRON 16 MG/50ML IVPB (CHCC)
16.0000 mg | Freq: Once | INTRAVENOUS | Status: AC
  Administered 2012-11-04: 16 mg via INTRAVENOUS

## 2012-11-04 MED ORDER — DEXAMETHASONE 4 MG PO TABS: 8.0000 mg | ORAL_TABLET | Freq: Two times a day (BID) | ORAL | Status: DC

## 2012-11-04 MED ORDER — ONDANSETRON HCL 8 MG PO TABS: 8.0000 mg | ORAL_TABLET | Freq: Two times a day (BID) | ORAL | Status: DC

## 2012-11-04 MED ORDER — PACLITAXEL CHEMO INJECTION 300 MG/50ML
175.0000 mg/m2 | Freq: Once | INTRAVENOUS | Status: AC
  Administered 2012-11-04: 618 mg via INTRAVENOUS
  Filled 2012-11-04: qty 103

## 2012-11-04 MED ORDER — SODIUM CHLORIDE 0.9 % IV SOLN
900.0000 mg | Freq: Once | INTRAVENOUS | Status: AC
  Administered 2012-11-04: 900 mg via INTRAVENOUS
  Filled 2012-11-04: qty 90

## 2012-11-04 MED ORDER — LORAZEPAM 0.5 MG PO TABS: 0.5000 mg | ORAL_TABLET | Freq: Four times a day (QID) | ORAL | Status: DC | PRN

## 2012-11-04 MED ORDER — PROCHLORPERAZINE MALEATE 10 MG PO TABS: 10.0000 mg | ORAL_TABLET | Freq: Four times a day (QID) | ORAL | Status: DC | PRN

## 2012-11-04 MED ORDER — SODIUM CHLORIDE 0.9 % IJ SOLN
10.0000 mL | INTRAMUSCULAR | Status: DC | PRN
  Administered 2012-11-04: 10 mL
  Filled 2012-11-04: qty 10

## 2012-11-04 MED ORDER — RANITIDINE HCL 50 MG/2ML IJ SOLN
50.0000 mg | Freq: Once | INTRAVENOUS | Status: AC
  Administered 2012-11-04: 50 mg via INTRAVENOUS
  Filled 2012-11-04: qty 2

## 2012-11-04 MED ORDER — DIPHENHYDRAMINE HCL 50 MG/ML IJ SOLN
50.0000 mg | Freq: Once | INTRAMUSCULAR | Status: AC
  Administered 2012-11-04: 50 mg via INTRAVENOUS

## 2012-11-04 MED ORDER — FAMOTIDINE IN NACL 20-0.9 MG/50ML-% IV SOLN: 20.0000 mg | Freq: Once | INTRAVENOUS | Status: DC

## 2012-11-04 MED ORDER — SODIUM CHLORIDE 0.9 % IV SOLN
Freq: Once | INTRAVENOUS | Status: AC
  Administered 2012-11-04: 12:00:00 via INTRAVENOUS

## 2012-11-04 NOTE — Telephone Encounter (Signed)
appts made and printed...td 

## 2012-11-04 NOTE — Patient Instructions (Addendum)
Cancer Center Discharge Instructions for Patients Receiving Chemotherapy  Today you received the following chemotherapy agents:  Taxol and Carboplatin  To help prevent nausea and vomiting after your treatment, we encourage you to take your nausea medication as ordered per MD.   If you develop nausea and vomiting that is not controlled by your nausea medication, call the clinic.   BELOW ARE SYMPTOMS THAT SHOULD BE REPORTED IMMEDIATELY:  *FEVER GREATER THAN 100.5 F  *CHILLS WITH OR WITHOUT FEVER  NAUSEA AND VOMITING THAT IS NOT CONTROLLED WITH YOUR NAUSEA MEDICATION  *UNUSUAL SHORTNESS OF BREATH  *UNUSUAL BRUISING OR BLEEDING  TENDERNESS IN MOUTH AND THROAT WITH OR WITHOUT PRESENCE OF ULCERS  *URINARY PROBLEMS  *BOWEL PROBLEMS  UNUSUAL RASH Items with * indicate a potential emergency and should be followed up as soon as possible.  Feel free to call the clinic you have any questions or concerns. The clinic phone number is [phone].    

## 2012-11-04 NOTE — Progress Notes (Signed)
OFFICE PROGRESS NOTE  CC  [name], MD [address] [place] [address] Dr. Laurette Schimke  DIAGNOSIS: 47 year old female with prior history of stage I a uterine cancer diagnosed in 2010. Now with recurrent disease diagnosed in May 2014   PRIOR THERAPY:  #1Patient subsequently developed excessive uterine bleeding. Bad tablet to dilatation and curettage with hysteroscopy. 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 Due to morbid obesity Mirena IUD was placed along with the addition of Aygestin. Patient had a dramatic directed weight loss with nutritional support she went from 523 pounds to 359 pounds in July of 2011. Patient continued to do well and is 12/27/2009 she at that time underwent a robotic-assisted laparoscopic hysterectomy, bilateral salpingo-oophorectomy, and mini laparotomy through the umbilical port with morcellation of uterus within about the delivery of the uterus. The final pathology did reveal an endometrial adenocarcinoma grade 2 with invasion limited to 1 mm of the myometrium  #2continued to do well until June 2014 when she developed right upper quadrant pain. It was presumed to be cholelithiasis. 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 Omental biopsies were obtained. The pathology was consistent with metastatic adenocarcinoma in the presumption of recurrent endometrial carcinoma was performed made. She was seen by Dr. Laurette Schimke on 10/07/2012. She was recommended that she undergo chemotherapy consisting of Taxol and carboplatinum  #3 abdominal ascites: Multiple paracenteses  CURRENT THERAPY: Taxol/Carboplatinum cycle 1 day 1  INTERVAL HISTORY: [name] 47 y.o. female returns for followup visit today of her endometrial cancer.  She is feeling well.  She had a paracentesis yesterday  and 9.3 liters was removed.  She had her port placed last week, and is feeling well afterwards.  She is having a hard time falling asleep.  She otherwise, denies fevers, chills, pain, nausea, vomiting, constipation, or any other concerns.  A 10 point ROS is otherwise negative.   MEDICAL HISTORY: Past Medical History  Diagnosis Date  . Uterine cancer   . Obesity   . DVT (deep venous thrombosis) 2009  . PONV (postoperative nausea and vomiting)   . Swelling     BOTH LEGS  . Rash     LOWER ABD  . Gallstones     ALLERGIES:  has No Known Allergies.  MEDICATIONS:  Current Outpatient Prescriptions  Medication Sig Dispense Refill  . Ascorbic Acid (VITAMIN C) 1000 MG tablet Take 1,000 mg by mouth daily.      . Cyanocobalamin (VITAMIN B-12 PO) Take 2,500 mg by mouth daily.       . folic acid (FOLVITE) 400 MCG tablet Take 400 mcg by mouth daily.        Marland Kitchen HYDROcodone-acetaminophen (NORCO/VICODIN) 5-325 MG per tablet Take 1 tablet by mouth every 6 (six) hours as needed for pain.      Marland Kitchen lidocaine-prilocaine (EMLA) cream Apply topically as needed. Apply to port-a-cath site 1-2 hours prior to treatment.  30 g  2  . magnesium gluconate (MAGONATE) 500 MG tablet Take 500 mg by mouth daily.      . Multiple Vitamins-Minerals (MULTIVITAMIN WITH MINERALS) tablet Take 1 tablet by mouth daily.       . naproxen sodium (ANAPROX) 220 MG tablet Take 880 mg by mouth every morning.       . ondansetron (ZOFRAN) 4 MG tablet Take 4 mg by mouth every 8 (eight)  hours as needed for nausea.      Marland Kitchen triamterene-hydrochlorothiazide (MAXZIDE-25) 37.5-25 MG per tablet Take 1 tablet by mouth every morning.       Marland Kitchen dexamethasone (DECADRON) 4 MG tablet Take 2 tablets (8 mg total) by mouth 2 (two) times daily with a meal. Take two times a day starting the day after chemotherapy for 3 days.  30 tablet  1  . LORazepam (ATIVAN) 0.5 MG tablet Take 1 tablet (0.5 mg total) by mouth every 6 (six) hours as needed (Nausea or vomiting).  30  tablet  0  . ondansetron (ZOFRAN) 8 MG tablet Take 1 tablet (8 mg total) by mouth 2 (two) times daily. Take two times a day starting the day after chemo for 3 days. Then take two times a day as needed for nausea or vomiting.  30 tablet  1  . prochlorperazine (COMPAZINE) 10 MG tablet Take 1 tablet (10 mg total) by mouth every 6 (six) hours as needed (Nausea or vomiting).  30 tablet  1   No current facility-administered medications for this visit.    SURGICAL HISTORY:  Past Surgical History  Procedure Laterality Date  . Ankle surgery  1950  . Wisdom tooth extraction    . Tonsillectomy and adenoidectomy    . Hysteroscopy w/d&c    . Abdominal hysterectomy  12/2009    [facility], [facility]  . Laparoscopy N/A 10/02/2012    Procedure: LAPAROSCOPY DIAGNOSTIC, perocentisis, omental biopsy;  Surgeon: Wilmon Arms. Corliss Skains, MD;  Location: [facility];  Service: General;  Laterality: N/A;  removed a total of 15,000 of acities    REVIEW OF SYSTEMS:  Pertinent items are noted in HPI.   HEALTH MAINTENANCE:  PHYSICAL EXAMINATION: Blood pressure 108/72, pulse 87, temperature 97.2 F (36.2 C), temperature source Oral, resp. rate 18, height 5\' 11"  (1.803 m), weight 540 lb 6.4 oz (245.124 kg). Body mass index is 75.4 kg/(m^2). General: Patient is a morbidly obese well appearing female in no acute distress HEENT: PERRLA, sclerae anicteric no conjunctival pallor, MMM Neck: supple, no palpable adenopathy Lungs: clear to auscultation bilaterally, no wheezes, rhonchi, or rales Cardiovascular: regular rate rhythm, S1, S2, no murmurs, rubs or gallops Abdomen: Soft, non-tender, non-distended, normoactive bowel sounds, no HSM Extremities: warm and well perfused, no clubbing, cyanosis, or edema Skin: No rashes or lesions Neuro: Non-focal ECOG PERFORMANCE STATUS: 2 - Symptomatic, <50% confined to bed      LABORATORY DATA: Lab Results  Component Value Date   WBC 7.9 11/04/2012   HGB 10.5* 11/04/2012   HCT 34.9 11/04/2012   MCV  83.3 11/04/2012   PLT 481* 11/04/2012      Chemistry      Component Value Date/Time   NA 135 10/31/2012 0655   NA 137 10/28/2012 1057   K 3.5 10/31/2012 0655   K 3.8 10/28/2012 1057   CL 97 10/31/2012 0655   CL 98 10/07/2012 0923   CO2 29 10/31/2012 0655   CO2 31* 10/28/2012 1057   BUN 8 10/31/2012 0655   BUN 8.5 10/28/2012 1057   CREATININE 0.78 10/31/2012 0655   CREATININE 0.8 10/28/2012 1057      Component Value Date/Time   CALCIUM 8.3* 10/31/2012 0655   CALCIUM 8.6 10/28/2012 1057   ALKPHOS 54 10/28/2012 1057   ALKPHOS 55 09/09/2012 1315   AST 20 10/28/2012 1057   AST 25 09/09/2012 1315   ALT 11 10/28/2012 1057   ALT 17 09/09/2012 1315   BILITOT 0.43 10/28/2012 1057  BILITOT 0.4 09/09/2012 1315       RADIOGRAPHIC STUDIES:  Ct Chest W Contrast  10/21/2012   *RADIOLOGY REPORT*  Clinical Data:  Recurrent endometrial cancer.  CT CHEST, ABDOMEN AND PELVIS WITH CONTRAST  Technique: Contiguous axial images of the chest abdomen and pelvis were obtained after IV contrast administration.  Contrast: 80 ml Omnipaque-300  Comparison: Abdominal pelvic CT of 02/11/2009.  No prior chest CT. Plain film chest of 12/23/2009 is reviewed.  CT CHEST  Findings: Lung windows demonstrate moderate degradation secondary to motion and patient body habitus. No airspace opacities.  Soft tissue windows demonstrate no definite supraclavicular adenopathy.  Mild cardiomegaly, without pericardial or pleural effusion.  No gross mediastinal or hilar adenopathy.  Tiny hiatal hernia.  IMPRESSION:  1.  Moderate degradation secondary to motion and patient body habitus. 2.  Given this factor, no acute process or evidence of metastatic disease within the chest.  CT ABDOMEN AND PELVIS  Findings:  Moderate to marked degradation involving the abdomen. Nondiagnostic evaluation of the pelvis.  Possible hyperenhancing nodule in the hepatic dome 1.7 cm on image 34/series 2.  Grossly normal spleen, distal stomach.  Pancreas not well evaluated.  Gallbladder  not evaluated.  Grossly normal adrenal glands and kidneys.  No gross retroperitoneal abdominal adenopathy.  A moderate amount of abdominal ascites.  No gross bowel obstruction.    Pelvic adenopathy cannot be evaluated.  Moderate volume pelvic free fluid.  No gross osseous abnormality.  IMPRESSION:  1.  Moderate to markedly limited evaluation of the abdomen secondary patient body habitus.  Nondiagnostic evaluation of the pelvis. 2.  Moderate volume abdominal pelvic ascites.  No gross retroperitoneal abdominal adenopathy. 3.  Possible hepatic dome nodule, indeterminate.  Not readily apparent on the prior of 2010.  As the patient is not a candidate for MRI, this could be reevaluated on follow-up CTs.   Original Report Authenticated By: Jeronimo Greaves, M.D.   Ct Abdomen Pelvis W Contrast  10/21/2012   *RADIOLOGY REPORT*  Clinical Data:  Recurrent endometrial cancer.  CT CHEST, ABDOMEN AND PELVIS WITH CONTRAST  Technique: Contiguous axial images of the chest abdomen and pelvis were obtained after IV contrast administration.  Contrast: 80 ml Omnipaque-300  Comparison: Abdominal pelvic CT of 02/11/2009.  No prior chest CT. Plain film chest of 12/23/2009 is reviewed.  CT CHEST  Findings: Lung windows demonstrate moderate degradation secondary to motion and patient body habitus. No airspace opacities.  Soft tissue windows demonstrate no definite supraclavicular adenopathy.  Mild cardiomegaly, without pericardial or pleural effusion.  No gross mediastinal or hilar adenopathy.  Tiny hiatal hernia.  IMPRESSION:  1.  Moderate degradation secondary to motion and patient body habitus. 2.  Given this factor, no acute process or evidence of metastatic disease within the chest.  CT ABDOMEN AND PELVIS  Findings:  Moderate to marked degradation involving the abdomen. Nondiagnostic evaluation of the pelvis.  Possible hyperenhancing nodule in the hepatic dome 1.7 cm on image 34/series 2.  Grossly normal spleen, distal stomach.  Pancreas  not well evaluated.  Gallbladder not evaluated.  Grossly normal adrenal glands and kidneys.  No gross retroperitoneal abdominal adenopathy.  A moderate amount of abdominal ascites.  No gross bowel obstruction.    Pelvic adenopathy cannot be evaluated.  Moderate volume pelvic free fluid.  No gross osseous abnormality.  IMPRESSION:  1.  Moderate to markedly limited evaluation of the abdomen secondary patient body habitus.  Nondiagnostic evaluation of the pelvis. 2.  Moderate volume abdominal pelvic ascites.  No gross retroperitoneal abdominal adenopathy. 3.  Possible hepatic dome nodule, indeterminate.  Not readily apparent on the prior of 2010.  As the patient is not a candidate for MRI, this could be reevaluated on follow-up CTs.   Original Report Authenticated By: Jeronimo Greaves, M.D.   US Paracentesis  10/28/2012   *RADIOLOGY REPORT*  Clinical Data: Recurrent uterine cancer, abdominal distension and discomfort secondary to ascites.  ULTRASOUND GUIDED PARACENTESIS  Comparison:  Previous paracentesis  An ultrasound guided paracentesis was thoroughly discussed with the patient and questions answered.  The benefits, risks, alternatives and complications were also discussed.  The patient understands and wishes to proceed with the procedure.  Written consent was obtained.  Ultrasound was performed to localize and mark an adequate pocket of fluid in the left upper quadrant of the abdomen.  The area was then prepped and draped in the normal sterile fashion.  1% Lidocaine was used for local anesthesia.  Under ultrasound guidance a 19 gauge Yueh catheter was introduced.  Paracentesis was performed.  The catheter was removed and a dressing applied.  Complications:  None  Findings:  A total of approximately 10 liters of dark amber-colored fluid was removed.  A fluid sample was not sent for laboratory analysis.  IMPRESSION: Successful ultrasound guided paracentesis yielding 10 liters of ascites.  Read by Brayton El PA-C    Original Report Authenticated By: Richarda Overlie, M.D.   US Paracentesis  10/14/2012   *RADIOLOGY REPORT*  Clinical Data: Recurrent uterine cancer, abdominal distension and discomfort secondary to ascites.  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 Patient will be treated with Taxol and carboplatinum palliatively. Port a cath was placed in the OR, and patient will proceed with chemotherapy beginning 11/04/12.  #2 Ascites: Patient will undergo weekly paracenteses, we will evaluate the necessity of these at every appointment.     PLAN:   #1 Doing well.  Proceed with chemotherapy.  We discussed the treatment, possible adverse effects, particularly nail/skin care, neuropathy, nausea/vomiting and use of anti-emetics, and Neulasta in detail.  She will bring in all of her medications for [place] to do a detail medication reconciliation at her next appointment.  She will take Ativan at night to help with sleep.    #2 Her last paracentesis was yesterday that removed 9.3 liters.  She has another one scheduled for Monday, 7/21.    All questions were answered. The patient knows to call the clinic with any problems, questions or concerns. We can certainly see the patient much sooner if necessary.  I spent 40 minutes counseling the patient face to face. The total time spent in the appointment was 50 minutes.  Cherie Ouch Lyn Hollingshead, NP Medical Oncology [facility] Phone: [phone]

## 2012-11-04 NOTE — Patient Instructions (Addendum)
Doing well.  Proceed with chemotherapy.  I have ordered the ultrasound guided paracenteses for you for the next three weeks.    Nausea Medication  1. Ondansetron/Zofran:  Take 8mg  twice a day for three days beginning the day after chemotherapy.    2. Dexamethasone/Decadron: Take 8mg  twice a day for three days beginning the day after chemotherapy.  3. Prochlorperazine/Compazine: Take 10 mg every 6 hours if needed for nausea and vomiting.    4. Lorazepam/Ativan: Take 0.5mg  at night to help with sleep and nausea.      Take Claritin/Loratidine 10mg  daily beginning the day after chemotherapy, for 5 days to help prevent bone pain related to the Neulasta.    Pegfilgrastim/Neulasta injection What is this medicine? PEGFILGRASTIM (peg fil GRA stim) helps the body make more white blood cells. It is used to prevent infection in people with low amounts of white blood cells following cancer treatment. This medicine may be used for other purposes; ask your health care provider or pharmacist if you have questions. What should I tell my health care provider before I take this medicine? They need to know if you have any of these conditions: -sickle cell disease -an unusual or allergic reaction to pegfilgrastim, filgrastim, E.coli protein, other medicines, foods, dyes, or preservatives -pregnant or trying to get pregnant -breast-feeding How should I use this medicine? This medicine is for injection under the skin. It is usually given by a health care professional in a hospital or clinic setting. If you get this medicine at home, you will be taught how to prepare and give this medicine. Do not shake this medicine. Use exactly as directed. Take your medicine at regular intervals. Do not take your medicine more often than directed. It is important that you put your used needles and syringes in a special sharps container. Do not put them in a trash can. If you do not have a sharps container, call your  pharmacist or healthcare provider to get one. Talk to your pediatrician regarding the use of this medicine in children. While this drug may be prescribed for children who weigh more than 45 kg for selected conditions, precautions do apply Overdosage: If you think you have taken too much of this medicine contact a poison control center or emergency room at once. NOTE: This medicine is only for you. Do not share this medicine with others. What if I miss a dose? If you miss a dose, take it as soon as you can. If it is almost time for your next dose, take only that dose. Do not take double or extra doses. What may interact with this medicine? -lithium -medicines for growth therapy This list may not describe all possible interactions. Give your health care provider a list of all the medicines, herbs, non-prescription drugs, or dietary supplements you use. Also tell them if you smoke, drink alcohol, or use illegal drugs. Some items may interact with your medicine. What should I watch for while using this medicine? Visit your doctor for regular check ups. You will need important blood work done while you are taking this medicine. What side effects may I notice from receiving this medicine? Side effects that you should report to your doctor or health care professional as soon as possible: -allergic reactions like skin rash, itching or hives, swelling of the face, lips, or tongue -breathing problems -fever -pain, redness, or swelling where injected -shoulder pain -stomach or side pain Side effects that usually do not require medical attention (report to your  doctor or health care professional if they continue or are bothersome): -aches, pains -headache -loss of appetite -nausea, vomiting -unusually tired This list may not describe all possible side effects. Call your doctor for medical advice about side effects. You may report side effects to FDA at 1-800-FDA-1088. Where should I keep my  medicine? Keep out of the reach of children. Store in a refrigerator between 2 and 8 degrees C (36 and 46 degrees F). Do not freeze. Keep in carton to protect from light. Throw away this medicine if it is left out of the refrigerator for more than 48 hours. Throw away any unused medicine after the expiration date. NOTE: This sheet is a summary. It may not cover all possible information. If you have questions about this medicine, talk to your doctor, pharmacist, or health care provider.  2013, [employer]. (11/10/2007 3:41:44 PM)

## 2012-11-05 ENCOUNTER — Encounter: Payer: Self-pay | Admitting: Oncology

## 2012-11-05 ENCOUNTER — Telehealth: Payer: Self-pay | Admitting: *Deleted

## 2012-11-05 ENCOUNTER — Ambulatory Visit (HOSPITAL_BASED_OUTPATIENT_CLINIC_OR_DEPARTMENT_OTHER): Payer: BC Managed Care – PPO

## 2012-11-05 VITALS — BP 107/64 | HR 86 | Temp 97.7°F

## 2012-11-05 DIAGNOSIS — Z5189 Encounter for other specified aftercare: Secondary | ICD-10-CM

## 2012-11-05 DIAGNOSIS — C786 Secondary malignant neoplasm of retroperitoneum and peritoneum: Secondary | ICD-10-CM

## 2012-11-05 DIAGNOSIS — C541 Malignant neoplasm of endometrium: Secondary | ICD-10-CM

## 2012-11-05 DIAGNOSIS — C549 Malignant neoplasm of corpus uteri, unspecified: Secondary | ICD-10-CM

## 2012-11-05 MED ORDER — PEGFILGRASTIM INJECTION 6 MG/0.6ML
6.0000 mg | Freq: Once | SUBCUTANEOUS | Status: AC
  Administered 2012-11-05: 6 mg via SUBCUTANEOUS
  Filled 2012-11-05: qty 0.6

## 2012-11-05 NOTE — Patient Instructions (Addendum)

## 2012-11-05 NOTE — Telephone Encounter (Signed)
[name] here for Neulasta injection following 1st AC chemotherapy.  States that she is doing good.  Only slight nausea when traveling in the car.  Is drinking and eating without difficulty.  Knows to call the clinic if she has any problems or concerns.

## 2012-11-05 NOTE — Progress Notes (Signed)
Called the patient's mom back. I gave ph# to billing they are concerned for the co pays each time. They will call for possible asst or pay arrangements.

## 2012-11-06 ENCOUNTER — Other Ambulatory Visit: Payer: Self-pay | Admitting: Adult Health

## 2012-11-06 DIAGNOSIS — C541 Malignant neoplasm of endometrium: Secondary | ICD-10-CM

## 2012-11-07 ENCOUNTER — Other Ambulatory Visit: Payer: Self-pay | Admitting: Emergency Medicine

## 2012-11-07 ENCOUNTER — Telehealth: Payer: Self-pay | Admitting: Oncology

## 2012-11-07 ENCOUNTER — Other Ambulatory Visit: Payer: Self-pay | Admitting: Gynecologic Oncology

## 2012-11-07 DIAGNOSIS — C541 Malignant neoplasm of endometrium: Secondary | ICD-10-CM

## 2012-11-07 MED ORDER — OXYCODONE-ACETAMINOPHEN 10-325 MG PO TABS: 1.0000 | ORAL_TABLET | ORAL | Status: DC | PRN

## 2012-11-07 NOTE — Progress Notes (Signed)
Patient's mother called and stated that the patient would like to have her paracentesis today instead of Monday but she is unable to walk from the house to the car due to "her legs giving her a fit."  Requesting stronger pain medication with no relief from hydrocodone-APAP.  Advised that a script for Percocet 10/325 one tablet every four hours would be available for pick-up.  Instructions reviewed with the mother when the prescription was picked up.  Instructed to call for any questions or concerns.

## 2012-11-07 NOTE — Telephone Encounter (Signed)
Radiology will contact pt re paracentesis per 7/17 pof. No other orders.

## 2012-11-08 ENCOUNTER — Emergency Department (HOSPITAL_COMMUNITY)
Admission: EM | Admit: 2012-11-08 | Discharge: 2012-11-08 | Disposition: A | Discharge status: Home / Self Care | Payer: BC Managed Care – PPO | Attending: Emergency Medicine | Admitting: Emergency Medicine

## 2012-11-08 ENCOUNTER — Encounter (HOSPITAL_COMMUNITY): Payer: Self-pay

## 2012-11-08 ENCOUNTER — Emergency Department (HOSPITAL_COMMUNITY): Payer: BC Managed Care – PPO

## 2012-11-08 DIAGNOSIS — Z86718 Personal history of other venous thrombosis and embolism: Secondary | ICD-10-CM | POA: Insufficient documentation

## 2012-11-08 DIAGNOSIS — R141 Gas pain: Secondary | ICD-10-CM | POA: Insufficient documentation

## 2012-11-08 DIAGNOSIS — Z90710 Acquired absence of both cervix and uterus: Secondary | ICD-10-CM | POA: Insufficient documentation

## 2012-11-08 DIAGNOSIS — Z8719 Personal history of other diseases of the digestive system: Secondary | ICD-10-CM | POA: Insufficient documentation

## 2012-11-08 DIAGNOSIS — C541 Malignant neoplasm of endometrium: Secondary | ICD-10-CM

## 2012-11-08 DIAGNOSIS — E669 Obesity, unspecified: Secondary | ICD-10-CM | POA: Insufficient documentation

## 2012-11-08 DIAGNOSIS — R110 Nausea: Secondary | ICD-10-CM | POA: Insufficient documentation

## 2012-11-08 DIAGNOSIS — C549 Malignant neoplasm of corpus uteri, unspecified: Secondary | ICD-10-CM | POA: Insufficient documentation

## 2012-11-08 DIAGNOSIS — R188 Other ascites: Secondary | ICD-10-CM

## 2012-11-08 DIAGNOSIS — Z9889 Other specified postprocedural states: Secondary | ICD-10-CM | POA: Insufficient documentation

## 2012-11-08 DIAGNOSIS — Z79899 Other long term (current) drug therapy: Secondary | ICD-10-CM | POA: Insufficient documentation

## 2012-11-08 DIAGNOSIS — Z872 Personal history of diseases of the skin and subcutaneous tissue: Secondary | ICD-10-CM | POA: Insufficient documentation

## 2012-11-08 DIAGNOSIS — R142 Eructation: Secondary | ICD-10-CM | POA: Insufficient documentation

## 2012-11-08 HISTORY — DX: Other ascites: R18.8

## 2012-11-08 MED ORDER — SODIUM CHLORIDE 0.9 % IV SOLN: INTRAVENOUS | Status: DC

## 2012-11-08 MED ORDER — OXYCODONE-ACETAMINOPHEN 5-325 MG PO TABS
1.0000 | ORAL_TABLET | Freq: Once | ORAL | Status: AC
  Administered 2012-11-08: 1 via ORAL
  Filled 2012-11-08: qty 1

## 2012-11-08 NOTE — ED Notes (Signed)
EAV:[id number]> Expected date:11/08/12<BR> Expected time:11:27 AM<BR> Means of arrival:Ambulance<BR> Comments:<BR> Abd pain N/V

## 2012-11-08 NOTE — ED Provider Notes (Signed)
History    CSN: [id number] Arrival date & time 11/08/12  1037  First MD Initiated Contact with Patient 11/08/12 1125     Chief Complaint  Patient presents with  . needs paracentesis done    (Consider location/radiation/quality/duration/timing/severity/associated sxs/prior Treatment) The history is provided by the patient.   patient here complaining of worsening ascites requiring a paracentesis. Called the cancer Center and was told to come here this weekend and symptoms became worse. It began 3 days ago. No fever or chills. Some mild epigastric pain. Has had nausea but no vomiting. No black stools reported. Patient is not severely dyspneic at this time Past Medical History  Diagnosis Date  . Uterine cancer   . Obesity   . DVT (deep venous thrombosis) 2009  . PONV (postoperative nausea and vomiting)   . Swelling     BOTH LEGS  . Rash     LOWER ABD  . Gallstones   . Ascites    Past Surgical History  Procedure Laterality Date  . Ankle surgery  1950  . Wisdom tooth extraction    . Tonsillectomy and adenoidectomy    . Hysteroscopy w/d&c    . Abdominal hysterectomy  12/2009    [facility]  . Laparoscopy N/A 10/02/2012    Procedure: LAPAROSCOPY DIAGNOSTIC, perocentisis, omental biopsy;  Surgeon: Wilmon Arms. Tsuei, MD;  Location: [facility];  Service: General;  Laterality: N/A;  removed a total of 15,000 of acities  . Paracentesis     Family History  Problem Relation Age of Onset  . Heart disease Father   . Diabetes Brother   . Heart disease Mother    History  Substance Use Topics  . Smoking status: Never Smoker   . Smokeless tobacco: Not on file  . Alcohol Use: Yes     Comment: social, Occassionally   OB History   Grav Para Term Preterm Abortions TAB SAB Ect Mult Living                 Review of Systems  All other systems reviewed and are negative.    Allergies  Review of patient's allergies indicates no known allergies.  Home Medications   Current Outpatient Rx   Name  Route  Sig  Dispense  Refill  . Ascorbic Acid (VITAMIN C) 1000 MG tablet   Oral   Take 1,000 mg by mouth daily.         . Cyanocobalamin (VITAMIN B-12 PO)   Oral   Take 2,500 mg by mouth daily.          Marland Kitchen dexamethasone (DECADRON) 4 MG tablet   Oral   Take 2 tablets (8 mg total) by mouth 2 (two) times daily with a meal. Take two times a day starting the day after chemotherapy for 3 days.   30 tablet   1   . folic acid (FOLVITE) 400 MCG tablet   Oral   Take 400 mcg by mouth daily.           Marland Kitchen lidocaine-prilocaine (EMLA) cream   Topical   Apply topically as needed. Apply to port-a-cath site 1-2 hours prior to treatment.   30 g   2   . loratadine (CLARITIN) 10 MG tablet   Oral   Take 10 mg by mouth daily.         Marland Kitchen LORazepam (ATIVAN) 0.5 MG tablet   Oral   Take 1 tablet (0.5 mg total) by mouth every 6 (six) hours as needed (  Nausea or vomiting).   30 tablet   0   . magnesium gluconate (MAGONATE) 500 MG tablet   Oral   Take 500 mg by mouth daily.         . Multiple Vitamins-Minerals (MULTIVITAMIN WITH MINERALS) tablet   Oral   Take 1 tablet by mouth daily.          . naproxen sodium (ANAPROX) 220 MG tablet   Oral   Take 880 mg by mouth every morning.          . ondansetron (ZOFRAN) 8 MG tablet   Oral   Take 1 tablet (8 mg total) by mouth 2 (two) times daily. Take two times a day starting the day after chemo for 3 days. Then take two times a day as needed for nausea or vomiting.   30 tablet   1   . oxyCODONE-acetaminophen (PERCOCET) 10-325 MG per tablet   Oral   Take 1 tablet by mouth every 4 (four) hours as needed for pain.   90 tablet   0   . pegfilgrastim (NEULASTA) 6 MG/0.6ML injection   Subcutaneous   Inject 6 mg into the skin every 21 ( twenty-one) days.         Marland Kitchen PRESCRIPTION MEDICATION      every 21 ( twenty-one) days. CHEMOTHERAPY REGIMEN         . prochlorperazine (COMPAZINE) 10 MG tablet   Oral   Take 1 tablet (10 mg  total) by mouth every 6 (six) hours as needed (Nausea or vomiting).   30 tablet   1   . triamterene-hydrochlorothiazide (MAXZIDE-25) 37.5-25 MG per tablet   Oral   Take 1 tablet by mouth every morning.           BP 124/79  Pulse 89  Temp(Src) 97.5 F (36.4 C) (Oral)  Resp 18  Ht 5\' 11"  (1.803 m)  Wt 544 lb (246.757 kg)  BMI 75.91 kg/m2  SpO2 99% Physical Exam  Nursing note and vitals reviewed. Constitutional: She is oriented to person, place, and time. She appears well-developed and well-nourished.  Non-toxic appearance. No distress.  HENT:  Head: Normocephalic and atraumatic.  Eyes: Conjunctivae, EOM and lids are normal. Pupils are equal, round, and reactive to light.  Neck: Normal range of motion. Neck supple. No tracheal deviation present. No mass present.  Cardiovascular: Normal rate, regular rhythm and normal heart sounds.  Exam reveals no gallop.   No murmur heard. Pulmonary/Chest: Effort normal and breath sounds normal. No stridor. No respiratory distress. She has no decreased breath sounds. She has no wheezes. She has no rhonchi. She has no rales.  Abdominal: Soft. Normal appearance and bowel sounds are normal. She exhibits distension, fluid wave and ascites. There is tenderness in the epigastric area. There is no rigidity, no rebound, no guarding and no CVA tenderness.  Musculoskeletal: Normal range of motion. She exhibits no edema and no tenderness.  Neurological: She is alert and oriented to person, place, and time. She has normal strength. No cranial nerve deficit or sensory deficit. GCS eye subscore is 4. GCS verbal subscore is 5. GCS motor subscore is 6.  Skin: Skin is warm and dry. No abrasion and no rash noted.  Psychiatric: She has a normal mood and affect. Her speech is normal and behavior is normal.    ED Course  Procedures (including critical care time) Labs Reviewed - No data to display No results found. No diagnosis found.  MDM  Spoke with the  interventional radiologist on call, Dr. Samuella Cota, and he will attempt to have a paracentesis done today.  Toy Baker, MD 11/08/12 1230

## 2012-11-08 NOTE — ED Notes (Signed)
Pt has hx of endometrial cancer and is scheduled for a paracentesis to be done on Monday.  Was told to come here sooner if needed.  Pt has continual nausea, abd tightness and difficulty getting a deep breath.  Currently pt is warm, dry, pale and nauseated.  Can speak in full sentences.  States the fluid in her belly is making her feel sicker than her chemo treatments

## 2012-11-08 NOTE — ED Notes (Signed)
Pt returned from Paracentesis, feels better, removed greater than 10 L of fluid.

## 2012-11-08 NOTE — ED Notes (Signed)
Pt reports being in pain, however she does not want any pain medicine because she is afraid she will be to weak to get back in the car to go home.

## 2012-11-08 NOTE — ED Notes (Signed)
Pt refusing IV and to have labs collected.

## 2012-11-08 NOTE — ED Notes (Signed)
She states she has a neoplasia which produces ascites, for which she has had multiple paracenteses.  She has an impending app't. To have paracentesis the first of the week, however, she states "I just can't wait; they told me to come if I became too uncomfortable.  She cites fluid impeding her respiratory excursion and generalized abd. Discomfort.  She is in no distress.

## 2012-11-08 NOTE — ED Notes (Signed)
RN was asked to go speak with family. Family upset regarding wait time to go to [place] for Paracentesis. Pt and mother are understanding regarding time, two relatives (aunts to pt) are very verbal with hostile attitude toward the wait and care provided. One stating "On Monday morning I am going to call and complain about how bad the care is here." She went on to say no one is providing care to the patient. At that time pt has been seen by EDP, RN, nurse tech. RN did speak with [place] who stated it will be about 3 hours or longer (depending on any emergent cases). This was explained to the pt and family Aunt's started with complaining and stating people are not telling them the truth. They feel the radiologist does not want to see the pt since she was unable to come to her scheduled appointment. They state the radoliogist will not treat her well since he felt it is not an emergent situation. RN explained the radiologist is a professional and will provide professional care to the pt. After two aunt's left pt and mother in agreement to wait and have paracentesis, the behavior expressed by the two who have left is a normal response for them. Again it was stressed the most important outcome is quality care and comfort for the pt. Will continue to update family as information is provided.

## 2012-11-08 NOTE — ED Provider Notes (Signed)
3:30 PM Care assumed from Dr. Freida Busman.  Awaiting radiology to perform paracentesis.    6:56 PM Pt reports that paracentesis was successful and that she feels much better.  Well appearing.  DC'd home.    Candyce Churn, MD 11/08/12 [phone]

## 2012-11-10 ENCOUNTER — Other Ambulatory Visit (HOSPITAL_COMMUNITY): Payer: BC Managed Care – PPO

## 2012-11-10 ENCOUNTER — Ambulatory Visit (HOSPITAL_COMMUNITY): Admission: RE | Admit: 2012-11-10 | Payer: BC Managed Care – PPO | Source: Ambulatory Visit

## 2012-11-11 ENCOUNTER — Other Ambulatory Visit (HOSPITAL_BASED_OUTPATIENT_CLINIC_OR_DEPARTMENT_OTHER): Payer: BC Managed Care – PPO | Admitting: Lab

## 2012-11-11 ENCOUNTER — Encounter: Payer: Self-pay | Admitting: Adult Health

## 2012-11-11 ENCOUNTER — Ambulatory Visit (HOSPITAL_BASED_OUTPATIENT_CLINIC_OR_DEPARTMENT_OTHER): Payer: BC Managed Care – PPO | Admitting: Adult Health

## 2012-11-11 VITALS — BP 115/75 | HR 109 | Temp 97.4°F | Resp 20 | Ht 71.0 in | Wt >= 6400 oz

## 2012-11-11 DIAGNOSIS — C786 Secondary malignant neoplasm of retroperitoneum and peritoneum: Secondary | ICD-10-CM

## 2012-11-11 DIAGNOSIS — C549 Malignant neoplasm of corpus uteri, unspecified: Secondary | ICD-10-CM

## 2012-11-11 DIAGNOSIS — R110 Nausea: Secondary | ICD-10-CM

## 2012-11-11 DIAGNOSIS — R180 Malignant ascites: Secondary | ICD-10-CM

## 2012-11-11 DIAGNOSIS — C541 Malignant neoplasm of endometrium: Secondary | ICD-10-CM

## 2012-11-11 DIAGNOSIS — D709 Neutropenia, unspecified: Secondary | ICD-10-CM

## 2012-11-11 LAB — COMPREHENSIVE METABOLIC PANEL (CC13)
ALT: 13 U/L (ref 0–55)
Alkaline Phosphatase: 85 U/L (ref 40–150)
Sodium: 136 mEq/L (ref 136–145)
Total Bilirubin: 0.83 mg/dL (ref 0.20–1.20)
Total Protein: 5.8 g/dL — ABNORMAL LOW (ref 6.4–8.3)

## 2012-11-11 LAB — CBC WITH DIFFERENTIAL/PLATELET
BASO%: 0.5 % (ref 0.0–2.0)
EOS%: 6.3 % (ref 0.0–7.0)
Eosinophils Absolute: 0.1 10*3/uL (ref 0.0–0.5)
MCH: 24.9 pg — ABNORMAL LOW (ref 25.1–34.0)
MCHC: 30.7 g/dL — ABNORMAL LOW (ref 31.5–36.0)
MCV: 81.3 fL (ref 79.5–101.0)
MONO%: 24.2 % — ABNORMAL HIGH (ref 0.0–14.0)
NEUT#: 0.6 10*3/uL — ABNORMAL LOW (ref 1.5–6.5)
RBC: 4.65 10*6/uL (ref 3.70–5.45)
RDW: 16.4 % — ABNORMAL HIGH (ref 11.2–14.5)
nRBC: 0 % (ref 0–0)

## 2012-11-11 MED ORDER — CIPROFLOXACIN HCL 500 MG PO TABS: 500.0000 mg | ORAL_TABLET | Freq: Two times a day (BID) | ORAL | Status: DC

## 2012-11-11 MED ORDER — PALONOSETRON HCL INJECTION 0.25 MG/5ML
0.2500 mg | Freq: Once | INTRAVENOUS | Status: AC
  Administered 2012-11-11: 0.25 mg via INTRAVENOUS

## 2012-11-11 MED ORDER — SODIUM CHLORIDE 0.9 % IV SOLN
Freq: Once | INTRAVENOUS | Status: AC
  Administered 2012-11-11: 13:00:00 via INTRAVENOUS

## 2012-11-11 NOTE — Patient Instructions (Addendum)
Patient Neutropenia Instruction Sheet  Diagnosis: Ovarian Cancer      Treating Physician: Drue Second, MD  Treatment: 1. Type of chemotherapy: Taxol/Carbo 2. Date of last treatment: 11/04/12  Last Blood Counts: Lab Results  Component Value Date   WBC 1.9* 11/11/2012   HGB 11.6 11/11/2012   HCT 37.8 11/11/2012   MCV 81.3 11/11/2012   PLT 272 11/11/2012   ANC 600     Prophylactic Antibiotics: Cipro 500 mg by mouth twice a day Instructions: 1. Monitor temperature and call if fever  greater than 100.5, chills, shaking chills (rigors) 2. Call Physician on-call at [phone] 3. Give him/her symptoms and list of medications that you are taking and your last blood count.   Ciprofloxacin tablets What is this medicine? CIPROFLOXACIN (sip roe FLOX a sin) is a quinolone antibiotic. It is used to treat certain kinds of bacterial infections. It will not work for colds, flu, or other viral infections. This medicine may be used for other purposes; ask your health care provider or pharmacist if you have questions. What should I tell my health care provider before I take this medicine? They need to know if you have any of these conditions: -heart condition -joint problems -kidney disease -liver disease -myasthenia gravis -seizure disorder -an unusual or allergic reaction to ciprofloxacin, other antibiotics or medicines, foods, dyes, or preservatives -pregnant or trying to get pregnant -breast-feeding How should I use this medicine? Take this medicine by mouth with a glass of water. Follow the directions on the prescription label. Take your medicine at regular intervals. Do not take your medicine more often than directed. Take all of your medicine as directed even if you think your are better. Do not skip doses or stop your medicine early. You can take this medicine with food or on an empty stomach. It can be taken with a meal that contains dairy or calcium, but do not take it alone with a  dairy product, like milk or yogurt or calcium-fortified juice. A special MedGuide will be given to you by the pharmacist with each prescription and refill. Be sure to read this information carefully each time. Talk to your pediatrician regarding the use of this medicine in children. Special care may be needed. Overdosage: If you think you have taken too much of this medicine contact a poison control center or emergency room at once. NOTE: This medicine is only for you. Do not share this medicine with others. What if I miss a dose? If you miss a dose, take it as soon as you can. If it is almost time for your next dose, take only that dose. Do not take double or extra doses. What may interact with this medicine? Do not take this medicine with any of the following medications: -cisapride -droperidol -terfenadine -tizanidine This medicine may also interact with the following medications: -antacids -caffeine -cyclosporin -didanosine (ddI) buffered tablets or powder -medicines for diabetes -medicines for inflammation like ibuprofen, naproxen -methotrexate -multivitamins -omeprazole -phenytoin -probenecid -sucralfate -theophylline -warfarin This list may not describe all possible interactions. Give your health care provider a list of all the medicines, herbs, non-prescription drugs, or dietary supplements you use. Also tell them if you smoke, drink alcohol, or use illegal drugs. Some items may interact with your medicine. What should I watch for while using this medicine? Tell your doctor or health care professional if your symptoms do not improve. Do not treat diarrhea with over the counter products. Contact your doctor if you have diarrhea that lasts  more than 2 days or if it is severe and watery. You may get drowsy or dizzy. Do not drive, use machinery, or do anything that needs mental alertness until you know how this medicine affects you. Do not stand or sit up quickly, especially if  you are an older patient. This reduces the risk of dizzy or fainting spells. This medicine can make you more sensitive to the sun. Keep out of the sun. If you cannot avoid being in the sun, wear protective clothing and use sunscreen. Do not use sun lamps or tanning beds/booths. Avoid antacids, aluminum, calcium, iron, magnesium, and zinc products for 6 hours before and 2 hours after taking a dose of this medicine. What side effects may I notice from receiving this medicine? Side effects that you should report to your doctor or health care professional as soon as possible: -allergic reactions like skin rash, itching or hives, swelling of the face, lips, or tongue -breathing problems -confusion, nightmares or hallucinations -feeling faint or lightheaded, falls -irregular heartbeat -joint, muscle or tendon pain or swelling -pain or trouble passing urine -redness, blistering, peeling or loosening of the skin, including inside the mouth -seizure -unusual pain, numbness, tingling, or weakness Side effects that usually do not require medical attention (report to your doctor or health care professional if they continue or are bothersome): -diarrhea -nausea or stomach upset -white patches or sores in the mouth This list may not describe all possible side effects. Call your doctor for medical advice about side effects. You may report side effects to FDA at 1-800-FDA-1088. Where should I keep my medicine? Keep out of the reach of children. Store at room temperature below 30 degrees C (86 degrees F). Keep container tightly closed. Throw away any unused medicine after the expiration date. NOTE: This sheet is a summary. It may not cover all possible information. If you have questions about this medicine, talk to your doctor, pharmacist, or health care provider.  2012, [employer]. (06/27/2009 11:41:11 AM)   Nausea medication  Take Prochlorperazine every 6 hours as needed, and Lorazepam every  6 hours as needed.  Do not take the Ondansetron for three days.  You can start taking it on Friday.  Please call us if you have any questions or concerns.

## 2012-11-11 NOTE — Progress Notes (Signed)
OFFICE PROGRESS NOTE  CC  [name], MD [address] [place] [address] Dr. Laurette Schimke  DIAGNOSIS: 47 year old female with prior history of stage I a uterine cancer diagnosed in 2010. Now with recurrent disease diagnosed in May 2014   PRIOR THERAPY:  #1Patient subsequently developed excessive uterine bleeding. Bad tablet to dilatation and curettage with hysteroscopy. 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 Due to morbid obesity Mirena IUD was placed along with the addition of Aygestin. Patient had a dramatic directed weight loss with nutritional support she went from 523 pounds to 359 pounds in July of 2011. Patient continued to do well and is 12/27/2009 she at that time underwent a robotic-assisted laparoscopic hysterectomy, bilateral salpingo-oophorectomy, and mini laparotomy through the umbilical port with morcellation of uterus within about the delivery of the uterus. The final pathology did reveal an endometrial adenocarcinoma grade 2 with invasion limited to 1 mm of the myometrium  #2continued to do well until June 2014 when she developed right upper quadrant pain. It was presumed to be cholelithiasis. 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 Omental biopsies were obtained. The pathology was consistent with metastatic adenocarcinoma in the presumption of recurrent endometrial carcinoma was performed made. She was seen by Dr. Laurette Schimke on 10/07/2012. She was recommended that she undergo chemotherapy consisting of Taxol and carboplatinum  #3 abdominal ascites: Multiple paracenteses  CURRENT THERAPY: Taxol/Carboplatinum cycle 1 day 8  INTERVAL HISTORY: [name] 47 y.o. female returns for followup visit today of her endometrial cancer.  She received the first cycle of chemotherapy last  Tuesday.  She had bone pain due to the Neulasta beginning on Thursday, she took Claritin, Vicodin, which did not help and then they called and Percocet prescription.  She took the percocet one tablet every 4 hours around the clock and it improved the pain.  She then started feeling poorly on Sunday related to nausea.  She has taken the Dexamethasone, Lorazepam, She took the Dexamethasone/Ondansetron BID for three days.  She remains nauseated.  Her PO intake is decreased.  She is drinking approximately 10 ounces so far today.  She had a paracentesis on Saturday where 10.8 liters was removed.  She rescheduled the ultrasound guided paracentesis for Thursday, 11/13/12.  She is neutropenic.  Denies fevers, chills, constipation, diarrhea, mouth pain, or any further concerns.  Otherwise, a 10 point ROS is neg.    MEDICAL HISTORY: Past Medical History  Diagnosis Date  . Uterine cancer   . Obesity   . DVT (deep venous thrombosis) 2009  . PONV (postoperative nausea and vomiting)   . Swelling     BOTH LEGS  . Rash     LOWER ABD  . Gallstones   . Ascites     ALLERGIES:  has No Known Allergies.  MEDICATIONS:  Current Outpatient Prescriptions  Medication Sig Dispense Refill  . Ascorbic Acid (VITAMIN C) 1000 MG tablet Take 1,000 mg by mouth daily.      . Cyanocobalamin (VITAMIN B-12 PO) Take 2,500 mg by mouth daily.       Marland Kitchen dexamethasone (DECADRON) 4 MG tablet Take 2 tablets (8 mg total) by mouth 2 (two) times daily with a meal. Take two times a day starting the day after chemotherapy for 3 days.  30 tablet  1  . folic acid (FOLVITE) 400 MCG tablet  Take 400 mcg by mouth daily.        Marland Kitchen lidocaine-prilocaine (EMLA) cream Apply topically as needed. Apply to port-a-cath site 1-2 hours prior to treatment.  30 g  2  . loratadine (CLARITIN) 10 MG tablet Take 10 mg by mouth daily.      Marland Kitchen LORazepam (ATIVAN) 0.5 MG tablet Take 1 tablet (0.5 mg total) by mouth every 6 (six) hours as needed (Nausea or vomiting).  30  tablet  0  . magnesium gluconate (MAGONATE) 500 MG tablet Take 500 mg by mouth daily.      . Multiple Vitamins-Minerals (MULTIVITAMIN WITH MINERALS) tablet Take 1 tablet by mouth daily.       . naproxen sodium (ANAPROX) 220 MG tablet Take 880 mg by mouth every morning.       . ondansetron (ZOFRAN) 8 MG tablet Take 1 tablet (8 mg total) by mouth 2 (two) times daily. Take two times a day starting the day after chemo for 3 days. Then take two times a day as needed for nausea or vomiting.  30 tablet  1  . oxyCODONE-acetaminophen (PERCOCET) 10-325 MG per tablet Take 1 tablet by mouth every 4 (four) hours as needed for pain.  90 tablet  0  . pegfilgrastim (NEULASTA) 6 MG/0.6ML injection Inject 6 mg into the skin every 21 ( twenty-one) days.      Marland Kitchen PRESCRIPTION MEDICATION every 21 ( twenty-one) days. CHEMOTHERAPY REGIMEN      . prochlorperazine (COMPAZINE) 10 MG tablet Take 1 tablet (10 mg total) by mouth every 6 (six) hours as needed (Nausea or vomiting).  30 tablet  1  . triamterene-hydrochlorothiazide (MAXZIDE-25) 37.5-25 MG per tablet Take 1 tablet by mouth every morning.       . ciprofloxacin (CIPRO) 500 MG tablet Take 1 tablet (500 mg total) by mouth 2 (two) times daily.  14 tablet  0   No current facility-administered medications for this visit.    SURGICAL HISTORY:  Past Surgical History  Procedure Laterality Date  . Ankle surgery  1950  . Wisdom tooth extraction    . Tonsillectomy and adenoidectomy    . Hysteroscopy w/d&c    . Abdominal hysterectomy  12/2009    [facility], [facility]  . Laparoscopy N/A 10/02/2012    Procedure: LAPAROSCOPY DIAGNOSTIC, perocentisis, omental biopsy;  Surgeon: Wilmon Arms. Tsuei, MD;  Location: [facility];  Service: General;  Laterality: N/A;  removed a total of 15,000 of acities  . Paracentesis      REVIEW OF SYSTEMS:  Pertinent items are noted in HPI.   HEALTH MAINTENANCE:  PHYSICAL EXAMINATION: Blood pressure 115/75, pulse 109, temperature 97.4 F (36.3 C), temperature  source Oral, resp. rate 20, height 5\' 11"  (1.803 m), weight 504 lb 14.4 oz (229.021 kg). Body mass index is 70.45 kg/(m^2). General: Patient is a morbidly obese well appearing female in no acute distress HEENT: PERRLA, sclerae anicteric no conjunctival pallor, MMM Neck: supple, no palpable adenopathy Lungs: clear to auscultation bilaterally, no wheezes, rhonchi, or rales Cardiovascular: regular rate rhythm, S1, S2, no murmurs, rubs or gallops Abdomen: Soft, non-tender, non-distended, normoactive bowel sounds, no HSM Extremities: warm and well perfused, no clubbing, cyanosis, or edema Skin: No rashes or lesions Neuro: Non-focal ECOG PERFORMANCE STATUS: 2 - Symptomatic, <50% confined to bed   LABORATORY DATA: Lab Results  Component Value Date   WBC 1.9* 11/11/2012   HGB 11.6 11/11/2012   HCT 37.8 11/11/2012   MCV 81.3 11/11/2012   PLT 272 11/11/2012  Chemistry      Component Value Date/Time   NA 136 11/11/2012 1045   NA 135 10/31/2012 0655   K 3.9 11/11/2012 1045   K 3.5 10/31/2012 0655   CL 97 10/31/2012 0655   CL 98 10/07/2012 0923   CO2 27 11/11/2012 1045   CO2 29 10/31/2012 0655   BUN 8.4 11/11/2012 1045   BUN 8 10/31/2012 0655   CREATININE 0.8 11/11/2012 1045   CREATININE 0.78 10/31/2012 0655      Component Value Date/Time   CALCIUM 8.9 11/11/2012 1045   CALCIUM 8.3* 10/31/2012 0655   ALKPHOS 85 11/11/2012 1045   ALKPHOS 55 09/09/2012 1315   AST 18 11/11/2012 1045   AST 25 09/09/2012 1315   ALT 13 11/11/2012 1045   ALT 17 09/09/2012 1315   BILITOT 0.83 11/11/2012 1045   BILITOT 0.4 09/09/2012 1315       RADIOGRAPHIC STUDIES:  Ct Chest W Contrast  10/21/2012   *RADIOLOGY REPORT*  Clinical Data:  Recurrent endometrial cancer.  CT CHEST, ABDOMEN AND PELVIS WITH CONTRAST  Technique: Contiguous axial images of the chest abdomen and pelvis were obtained after IV contrast administration.  Contrast: 80 ml Omnipaque-300  Comparison: Abdominal pelvic CT of 02/11/2009.  No prior chest CT.  Plain film chest of 12/23/2009 is reviewed.  CT CHEST  Findings: Lung windows demonstrate moderate degradation secondary to motion and patient body habitus. No airspace opacities.  Soft tissue windows demonstrate no definite supraclavicular adenopathy.  Mild cardiomegaly, without pericardial or pleural effusion.  No gross mediastinal or hilar adenopathy.  Tiny hiatal hernia.  IMPRESSION:  1.  Moderate degradation secondary to motion and patient body habitus. 2.  Given this factor, no acute process or evidence of metastatic disease within the chest.  CT ABDOMEN AND PELVIS  Findings:  Moderate to marked degradation involving the abdomen. Nondiagnostic evaluation of the pelvis.  Possible hyperenhancing nodule in the hepatic dome 1.7 cm on image 34/series 2.  Grossly normal spleen, distal stomach.  Pancreas not well evaluated.  Gallbladder not evaluated.  Grossly normal adrenal glands and kidneys.  No gross retroperitoneal abdominal adenopathy.  A moderate amount of abdominal ascites.  No gross bowel obstruction.    Pelvic adenopathy cannot be evaluated.  Moderate volume pelvic free fluid.  No gross osseous abnormality.  IMPRESSION:  1.  Moderate to markedly limited evaluation of the abdomen secondary patient body habitus.  Nondiagnostic evaluation of the pelvis. 2.  Moderate volume abdominal pelvic ascites.  No gross retroperitoneal abdominal adenopathy. 3.  Possible hepatic dome nodule, indeterminate.  Not readily apparent on the prior of 2010.  As the patient is not a candidate for MRI, this could be reevaluated on follow-up CTs.   Original Report Authenticated By: Jeronimo Greaves, M.D.   Ct Abdomen Pelvis W Contrast  10/21/2012   *RADIOLOGY REPORT*  Clinical Data:  Recurrent endometrial cancer.  CT CHEST, ABDOMEN AND PELVIS WITH CONTRAST  Technique: Contiguous axial images of the chest abdomen and pelvis were obtained after IV contrast administration.  Contrast: 80 ml Omnipaque-300  Comparison: Abdominal pelvic CT of  02/11/2009.  No prior chest CT. Plain film chest of 12/23/2009 is reviewed.  CT CHEST  Findings: Lung windows demonstrate moderate degradation secondary to motion and patient body habitus. No airspace opacities.  Soft tissue windows demonstrate no definite supraclavicular adenopathy.  Mild cardiomegaly, without pericardial or pleural effusion.  No gross mediastinal or hilar adenopathy.  Tiny hiatal hernia.  IMPRESSION:  1.  Moderate degradation secondary  to motion and patient body habitus. 2.  Given this factor, no acute process or evidence of metastatic disease within the chest.  CT ABDOMEN AND PELVIS  Findings:  Moderate to marked degradation involving the abdomen. Nondiagnostic evaluation of the pelvis.  Possible hyperenhancing nodule in the hepatic dome 1.7 cm on image 34/series 2.  Grossly normal spleen, distal stomach.  Pancreas not well evaluated.  Gallbladder not evaluated.  Grossly normal adrenal glands and kidneys.  No gross retroperitoneal abdominal adenopathy.  A moderate amount of abdominal ascites.  No gross bowel obstruction.    Pelvic adenopathy cannot be evaluated.  Moderate volume pelvic free fluid.  No gross osseous abnormality.  IMPRESSION:  1.  Moderate to markedly limited evaluation of the abdomen secondary patient body habitus.  Nondiagnostic evaluation of the pelvis. 2.  Moderate volume abdominal pelvic ascites.  No gross retroperitoneal abdominal adenopathy. 3.  Possible hepatic dome nodule, indeterminate.  Not readily apparent on the prior of 2010.  As the patient is not a candidate for MRI, this could be reevaluated on follow-up CTs.   Original Report Authenticated By: Jeronimo Greaves, M.D.   US Paracentesis  10/28/2012   *RADIOLOGY REPORT*  Clinical Data: Recurrent uterine cancer, abdominal distension and discomfort secondary to ascites.  ULTRASOUND GUIDED PARACENTESIS  Comparison:  Previous paracentesis  An ultrasound guided paracentesis was thoroughly discussed with the patient and  questions answered.  The benefits, risks, alternatives and complications were also discussed.  The patient understands and wishes to proceed with the procedure.  Written consent was obtained.  Ultrasound was performed to localize and mark an adequate pocket of fluid in the left upper quadrant of the abdomen.  The area was then prepped and draped in the normal sterile fashion.  1% Lidocaine was used for local anesthesia.  Under ultrasound guidance a 19 gauge Yueh catheter was introduced.  Paracentesis was performed.  The catheter was removed and a dressing applied.  Complications:  None  Findings:  A total of approximately 10 liters of dark amber-colored fluid was removed.  A fluid sample was not sent for laboratory analysis.  IMPRESSION: Successful ultrasound guided paracentesis yielding 10 liters of ascites.  Read by Brayton El PA-C   Original Report Authenticated By: Richarda Overlie, M.D.   US Paracentesis  10/14/2012   *RADIOLOGY REPORT*  Clinical Data: Recurrent uterine cancer, abdominal distension and discomfort secondary to ascites.  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 Patient will be treated with Taxol and carboplatinum palliatively. Port a cath was placed in the OR, and patient will proceed with chemotherapy beginning 11/04/12.  #2 Ascites: Patient will undergo weekly paracenteses, we will evaluate the necessity of these at every appointment.     PLAN:   #1 Ms. Macconnell is not feeling well since her chemotherapy.  She is very nauseated and neutropenic.  We will give her IV fluids and Aloxi today.  I reviewed her nausea medication with her again.  She will start cipro tonight for the neutropenia.  She was given written neutropenic precautions and they were reviewed with her and her family in detail.    #2 Her last paracentesis was Saturday, she will have another one on Thursday 11/13/12.    #3 She  will return in one week for labs and an evaluation.    All questions were answered. The patient knows to call the clinic with any problems, questions or concerns. We can certainly see  the patient much sooner if necessary.  I spent 25 minutes counseling the patient face to face. The total time spent in the appointment was 30 minutes.  Cherie Ouch Lyn Hollingshead, NP Medical Oncology [facility] Phone: [phone]

## 2012-11-13 ENCOUNTER — Ambulatory Visit (HOSPITAL_COMMUNITY)
Admission: RE | Admit: 2012-11-13 | Discharge: 2012-11-13 | Disposition: A | Discharge status: Home / Self Care | Payer: BC Managed Care – PPO | Source: Ambulatory Visit | Attending: Adult Health | Admitting: Adult Health

## 2012-11-13 VITALS — BP 126/77

## 2012-11-13 DIAGNOSIS — R188 Other ascites: Secondary | ICD-10-CM

## 2012-11-13 DIAGNOSIS — C541 Malignant neoplasm of endometrium: Secondary | ICD-10-CM

## 2012-11-13 DIAGNOSIS — C549 Malignant neoplasm of corpus uteri, unspecified: Secondary | ICD-10-CM | POA: Insufficient documentation

## 2012-11-13 NOTE — Procedures (Signed)
US guided therapeutic paracentesis performed yielding 14.4 liters yellow fluid. No immediate complications.

## 2012-11-17 ENCOUNTER — Ambulatory Visit (HOSPITAL_COMMUNITY)
Admission: RE | Admit: 2012-11-17 | Discharge: 2012-11-17 | Disposition: A | Discharge status: Home / Self Care | Payer: BC Managed Care – PPO | Source: Ambulatory Visit | Attending: Adult Health | Admitting: Adult Health

## 2012-11-17 ENCOUNTER — Other Ambulatory Visit: Payer: Self-pay | Admitting: Adult Health

## 2012-11-17 DIAGNOSIS — R188 Other ascites: Secondary | ICD-10-CM | POA: Insufficient documentation

## 2012-11-17 DIAGNOSIS — C541 Malignant neoplasm of endometrium: Secondary | ICD-10-CM

## 2012-11-17 NOTE — Procedures (Signed)
Successful US guided paracentesis from [facility].  Yielded 7.2L of amber colored fluid.  No immediate complications.  Pt tolerated well.   Specimen was not sent for labs.  Brayton El PA-C 11/17/2012 12:48 PM

## 2012-11-18 ENCOUNTER — Encounter: Payer: Self-pay | Admitting: Adult Health

## 2012-11-18 ENCOUNTER — Ambulatory Visit: Payer: BC Managed Care – PPO

## 2012-11-18 ENCOUNTER — Other Ambulatory Visit (HOSPITAL_BASED_OUTPATIENT_CLINIC_OR_DEPARTMENT_OTHER): Payer: BC Managed Care – PPO | Admitting: Lab

## 2012-11-18 ENCOUNTER — Ambulatory Visit (HOSPITAL_BASED_OUTPATIENT_CLINIC_OR_DEPARTMENT_OTHER): Payer: BC Managed Care – PPO | Admitting: Adult Health

## 2012-11-18 VITALS — BP 116/73 | HR 97 | Temp 98.4°F | Resp 20 | Ht 71.0 in | Wt >= 6400 oz

## 2012-11-18 DIAGNOSIS — C549 Malignant neoplasm of corpus uteri, unspecified: Secondary | ICD-10-CM

## 2012-11-18 DIAGNOSIS — C541 Malignant neoplasm of endometrium: Secondary | ICD-10-CM

## 2012-11-18 LAB — CBC WITH DIFFERENTIAL/PLATELET
Basophils Absolute: 0.1 10*3/uL (ref 0.0–0.1)
Eosinophils Absolute: 0 10*3/uL (ref 0.0–0.5)
HCT: 32.3 % — ABNORMAL LOW (ref 34.8–46.6)
HGB: 10.3 g/dL — ABNORMAL LOW (ref 11.6–15.9)
LYMPH%: 13.4 % — ABNORMAL LOW (ref 14.0–49.7)
MONO#: 0.6 10*3/uL (ref 0.1–0.9)
NEUT#: 5.2 10*3/uL (ref 1.5–6.5)
NEUT%: 76.5 % (ref 38.4–76.8)
Platelets: 266 10*3/uL (ref 145–400)
WBC: 6.8 10*3/uL (ref 3.9–10.3)
lymph#: 0.9 10*3/uL (ref 0.9–3.3)

## 2012-11-18 LAB — COMPREHENSIVE METABOLIC PANEL (CC13)
ALT: 9 U/L (ref 0–55)
CO2: 30 mEq/L — ABNORMAL HIGH (ref 22–29)
Calcium: 8.2 mg/dL — ABNORMAL LOW (ref 8.4–10.4)
Chloride: 97 mEq/L — ABNORMAL LOW (ref 98–109)
Creatinine: 0.7 mg/dL (ref 0.6–1.1)
Glucose: 108 mg/dl (ref 70–140)
Total Bilirubin: 0.39 mg/dL (ref 0.20–1.20)

## 2012-11-18 MED ORDER — LORAZEPAM 0.5 MG PO TABS: 0.5000 mg | ORAL_TABLET | Freq: Four times a day (QID) | ORAL | Status: DC | PRN

## 2012-11-18 NOTE — Progress Notes (Signed)
OFFICE PROGRESS NOTE  CC  Catherine Rua, MD [address] [place] [address] Dr. Laurette Schimke  DIAGNOSIS: 47 year old female with prior history of stage I a uterine cancer diagnosed in 2010. Now with recurrent disease diagnosed in May 2014   PRIOR THERAPY:  #1Patient subsequently developed excessive uterine bleeding. Bad tablet to dilatation and curettage with hysteroscopy. 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 Due to morbid obesity Mirena IUD was placed along with the addition of Aygestin. Patient had a dramatic directed weight loss with nutritional support she went from 523 pounds to 359 pounds in July of 2011. Patient continued to do well and is 12/27/2009 she at that time underwent a robotic-assisted laparoscopic hysterectomy, bilateral salpingo-oophorectomy, and mini laparotomy through the umbilical port with morcellation of uterus within about the delivery of the uterus. The final pathology did reveal an endometrial adenocarcinoma grade 2 with invasion limited to 1 mm of the myometrium  #2continued to do well until June 2014 when she developed right upper quadrant pain. It was presumed to be cholelithiasis. 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 Omental biopsies were obtained. The pathology was consistent with metastatic adenocarcinoma in the presumption of recurrent endometrial carcinoma was performed made. She was seen by Dr. Laurette Schimke on 10/07/2012. She was recommended that she undergo chemotherapy consisting of Taxol and carboplatinum  #3 abdominal ascites: Multiple paracenteses  CURRENT THERAPY: Taxol/Carboplatinum cycle 1 day 15  INTERVAL HISTORY: [name] 47 y.o. female returns for followup visit today of her endometrial cancer.  She is feeling much better this week as compared to  last.  She denies fevers, chills, nausea, vomiting, constipation, diarrhea, numbness.  She received IV aloxi last week and IV fluids and the nausea has resolved.  She completed Cipro for neutropenic prophylaxis and has recovered.  She has continued to receive paracenteses and has done well.  Otherwise, a 10 point ROS is neg.    MEDICAL HISTORY: Past Medical History  Diagnosis Date  . Uterine cancer   . Obesity   . DVT (deep venous thrombosis) 2009  . PONV (postoperative nausea and vomiting)   . Swelling     BOTH LEGS  . Rash     LOWER ABD  . Gallstones   . Ascites     ALLERGIES:  has No Known Allergies.  MEDICATIONS:  Current Outpatient Prescriptions  Medication Sig Dispense Refill  . ciprofloxacin (CIPRO) 500 MG tablet Take 1 tablet (500 mg total) by mouth 2 (two) times daily.  14 tablet  0  . dexamethasone (DECADRON) 4 MG tablet Take 2 tablets (8 mg total) by mouth 2 (two) times daily with a meal. Take two times a day starting the day after chemotherapy for 3 days.  30 tablet  1  . loratadine (CLARITIN) 10 MG tablet Take 10 mg by mouth daily.      Marland Kitchen LORazepam (ATIVAN) 0.5 MG tablet Take 1 tablet (0.5 mg total) by mouth every 6 (six) hours as needed (Nausea or vomiting).  30 tablet  0  . ondansetron (ZOFRAN) 8 MG tablet Take 1 tablet (8 mg total) by mouth 2 (two) times daily. Take two times a day starting the day after chemo for 3 days. Then take two times a day as needed for nausea or vomiting.  30 tablet  1  . oxyCODONE-acetaminophen (PERCOCET) 10-325 MG  per tablet Take 1 tablet by mouth every 4 (four) hours as needed for pain.  90 tablet  0  . prochlorperazine (COMPAZINE) 10 MG tablet Take 1 tablet (10 mg total) by mouth every 6 (six) hours as needed (Nausea or vomiting).  30 tablet  1  . triamterene-hydrochlorothiazide (MAXZIDE-25) 37.5-25 MG per tablet Take 1 tablet by mouth every morning.       . Ascorbic Acid (VITAMIN C) 1000 MG tablet Take 1,000 mg by mouth daily.      .  Cyanocobalamin (VITAMIN B-12 PO) Take 2,500 mg by mouth daily.       . folic acid (FOLVITE) 400 MCG tablet Take 400 mcg by mouth daily.        Marland Kitchen lidocaine-prilocaine (EMLA) cream Apply topically as needed. Apply to port-a-cath site 1-2 hours prior to treatment.  30 g  2  . magnesium gluconate (MAGONATE) 500 MG tablet Take 500 mg by mouth daily.      . Multiple Vitamins-Minerals (MULTIVITAMIN WITH MINERALS) tablet Take 1 tablet by mouth daily.       . naproxen sodium (ANAPROX) 220 MG tablet Take 880 mg by mouth every morning.       . pegfilgrastim (NEULASTA) 6 MG/0.6ML injection Inject 6 mg into the skin every 21 ( twenty-one) days.      Marland Kitchen PRESCRIPTION MEDICATION every 21 ( twenty-one) days. CHEMOTHERAPY REGIMEN       No current facility-administered medications for this visit.    SURGICAL HISTORY:  Past Surgical History  Procedure Laterality Date  . Ankle surgery  1950  . Wisdom tooth extraction    . Tonsillectomy and adenoidectomy    . Hysteroscopy w/d&c    . Abdominal hysterectomy  12/2009    [facility], [facility]  . Laparoscopy N/A 10/02/2012    Procedure: LAPAROSCOPY DIAGNOSTIC, perocentisis, omental biopsy;  Surgeon: Wilmon Arms. Tsuei, MD;  Location: [facility];  Service: General;  Laterality: N/A;  removed a total of 15,000 of acities  . Paracentesis      REVIEW OF SYSTEMS:  Pertinent items are noted in HPI.   HEALTH MAINTENANCE:  PHYSICAL EXAMINATION: Blood pressure 116/73, pulse 97, temperature 98.4 F (36.9 C), temperature source Oral, resp. rate 20, height 5\' 11"  (1.803 m), weight 484 lb 1.6 oz (219.586 kg). Body mass index is 67.55 kg/(m^2). General: Patient is a morbidly obese well appearing female in no acute distress HEENT: PERRLA, sclerae anicteric no conjunctival pallor, MMM Neck: supple, no palpable adenopathy Lungs: clear to auscultation bilaterally, no wheezes, rhonchi, or rales Cardiovascular: regular rate rhythm, S1, S2, no murmurs, rubs or gallops Abdomen: Soft, non-tender,  non-distended, normoactive bowel sounds, no HSM Extremities: warm and well perfused, no clubbing, cyanosis, or edema Skin: No rashes or lesions Neuro: Non-focal ECOG PERFORMANCE STATUS: 2 - Symptomatic, <50% confined to bed   LABORATORY DATA: Lab Results  Component Value Date   WBC 6.8 11/18/2012   HGB 10.3* 11/18/2012   HCT 32.3* 11/18/2012   MCV 78.2* 11/18/2012   PLT 266 11/18/2012      Chemistry      Component Value Date/Time   NA 137 11/18/2012 1048   NA 135 10/31/2012 0655   K 3.4* 11/18/2012 1048   K 3.5 10/31/2012 0655   CL 97 10/31/2012 0655   CL 98 10/07/2012 0923   CO2 30* 11/18/2012 1048   CO2 29 10/31/2012 0655   BUN 7.8 11/18/2012 1048   BUN 8 10/31/2012 0655   CREATININE 0.7 11/18/2012 1048  CREATININE 0.78 10/31/2012 0655      Component Value Date/Time   CALCIUM 8.2* 11/18/2012 1048   CALCIUM 8.3* 10/31/2012 0655   ALKPHOS 74 11/18/2012 1048   ALKPHOS 55 09/09/2012 1315   AST 18 11/18/2012 1048   AST 25 09/09/2012 1315   ALT 9 11/18/2012 1048   ALT 17 09/09/2012 1315   BILITOT 0.39 11/18/2012 1048   BILITOT 0.4 09/09/2012 1315       RADIOGRAPHIC STUDIES:  Ct Chest W Contrast  10/21/2012   *RADIOLOGY REPORT*  Clinical Data:  Recurrent endometrial cancer.  CT CHEST, ABDOMEN AND PELVIS WITH CONTRAST  Technique: Contiguous axial images of the chest abdomen and pelvis were obtained after IV contrast administration.  Contrast: 80 ml Omnipaque-300  Comparison: Abdominal pelvic CT of 02/11/2009.  No prior chest CT. Plain film chest of 12/23/2009 is reviewed.  CT CHEST  Findings: Lung windows demonstrate moderate degradation secondary to motion and patient body habitus. No airspace opacities.  Soft tissue windows demonstrate no definite supraclavicular adenopathy.  Mild cardiomegaly, without pericardial or pleural effusion.  No gross mediastinal or hilar adenopathy.  Tiny hiatal hernia.  IMPRESSION:  1.  Moderate degradation secondary to motion and patient body habitus. 2.  Given this  factor, no acute process or evidence of metastatic disease within the chest.  CT ABDOMEN AND PELVIS  Findings:  Moderate to marked degradation involving the abdomen. Nondiagnostic evaluation of the pelvis.  Possible hyperenhancing nodule in the hepatic dome 1.7 cm on image 34/series 2.  Grossly normal spleen, distal stomach.  Pancreas not well evaluated.  Gallbladder not evaluated.  Grossly normal adrenal glands and kidneys.  No gross retroperitoneal abdominal adenopathy.  A moderate amount of abdominal ascites.  No gross bowel obstruction.    Pelvic adenopathy cannot be evaluated.  Moderate volume pelvic free fluid.  No gross osseous abnormality.  IMPRESSION:  1.  Moderate to markedly limited evaluation of the abdomen secondary patient body habitus.  Nondiagnostic evaluation of the pelvis. 2.  Moderate volume abdominal pelvic ascites.  No gross retroperitoneal abdominal adenopathy. 3.  Possible hepatic dome nodule, indeterminate.  Not readily apparent on the prior of 2010.  As the patient is not a candidate for MRI, this could be reevaluated on follow-up CTs.   Original Report Authenticated By: Jeronimo Greaves, M.D.   Ct Abdomen Pelvis W Contrast  10/21/2012   *RADIOLOGY REPORT*  Clinical Data:  Recurrent endometrial cancer.  CT CHEST, ABDOMEN AND PELVIS WITH CONTRAST  Technique: Contiguous axial images of the chest abdomen and pelvis were obtained after IV contrast administration.  Contrast: 80 ml Omnipaque-300  Comparison: Abdominal pelvic CT of 02/11/2009.  No prior chest CT. Plain film chest of 12/23/2009 is reviewed.  CT CHEST  Findings: Lung windows demonstrate moderate degradation secondary to motion and patient body habitus. No airspace opacities.  Soft tissue windows demonstrate no definite supraclavicular adenopathy.  Mild cardiomegaly, without pericardial or pleural effusion.  No gross mediastinal or hilar adenopathy.  Tiny hiatal hernia.  IMPRESSION:  1.  Moderate degradation secondary to motion and  patient body habitus. 2.  Given this factor, no acute process or evidence of metastatic disease within the chest.  CT ABDOMEN AND PELVIS  Findings:  Moderate to marked degradation involving the abdomen. Nondiagnostic evaluation of the pelvis.  Possible hyperenhancing nodule in the hepatic dome 1.7 cm on image 34/series 2.  Grossly normal spleen, distal stomach.  Pancreas not well evaluated.  Gallbladder not evaluated.  Grossly normal adrenal glands and  kidneys.  No gross retroperitoneal abdominal adenopathy.  A moderate amount of abdominal ascites.  No gross bowel obstruction.    Pelvic adenopathy cannot be evaluated.  Moderate volume pelvic free fluid.  No gross osseous abnormality.  IMPRESSION:  1.  Moderate to markedly limited evaluation of the abdomen secondary patient body habitus.  Nondiagnostic evaluation of the pelvis. 2.  Moderate volume abdominal pelvic ascites.  No gross retroperitoneal abdominal adenopathy. 3.  Possible hepatic dome nodule, indeterminate.  Not readily apparent on the prior of 2010.  As the patient is not a candidate for MRI, this could be reevaluated on follow-up CTs.   Original Report Authenticated By: Jeronimo Greaves, M.D.   US Paracentesis  10/28/2012   *RADIOLOGY REPORT*  Clinical Data: Recurrent uterine cancer, abdominal distension and discomfort secondary to ascites.  ULTRASOUND GUIDED PARACENTESIS  Comparison:  Previous paracentesis  An ultrasound guided paracentesis was thoroughly discussed with the patient and questions answered.  The benefits, risks, alternatives and complications were also discussed.  The patient understands and wishes to proceed with the procedure.  Written consent was obtained.  Ultrasound was performed to localize and mark an adequate pocket of fluid in the left upper quadrant of the abdomen.  The area was then prepped and draped in the normal sterile fashion.  1% Lidocaine was used for local anesthesia.  Under ultrasound guidance a 19 gauge Yueh catheter was  introduced.  Paracentesis was performed.  The catheter was removed and a dressing applied.  Complications:  None  Findings:  A total of approximately 10 liters of dark amber-colored fluid was removed.  A fluid sample was not sent for laboratory analysis.  IMPRESSION: Successful ultrasound guided paracentesis yielding 10 liters of ascites.  Read by Brayton El PA-C   Original Report Authenticated By: Richarda Overlie, M.D.   US Paracentesis  10/14/2012   *RADIOLOGY REPORT*  Clinical Data: Recurrent uterine cancer, abdominal distension and discomfort secondary to ascites.  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 Patient will be treated with Taxol and carboplatinum palliatively. Port a cath was placed in the OR, and patient will proceed with chemotherapy beginning 11/04/12.  #2 Ascites: Patient will undergo weekly paracenteses, we will evaluate the necessity of these at every appointment.     PLAN:   #1 Ms. Favero is much improved today.  Her labs have recovered, and her nausea has resolved. I have changed the premedications in her treatment plan to improve the nausea/vomiting associated with chemotherapy.    #2 Her last paracentesis was Saturday, she will have another one on Monday, 11/24/12.     #3 She will return in one week for labs and an evaluation.    All questions were answered. The patient knows to call the clinic with any problems, questions or concerns. We can certainly see the patient much sooner if necessary.  I spent 25 minutes counseling the patient face to face. The total time spent in the appointment was 30 minutes.  Cherie Ouch Lyn Hollingshead, NP Medical Oncology [facility] Phone: [phone]

## 2012-11-18 NOTE — Patient Instructions (Addendum)
Foods Rich in Potassium Food / Potassium (mg)  Apricots, dried,  cup / 378 mg   Apricots, raw, 1 cup halves / 401 mg   Avocado,  / 487 mg   Banana, 1 large / 487 mg   Beef, lean, round, 3 oz / 202 mg   Cantaloupe, 1 cup cubes / 427 mg   Dates, medjool, 5 whole / 835 mg   Ham, cured, 3 oz / 212 mg   Lentils, dried,  cup / 458 mg   Lima beans, frozen,  cup / 258 mg   Orange, 1 large / 333 mg   Orange juice, 1 cup / 443 mg   Peaches, dried,  cup / 398 mg   Peas, split, cooked,  cup / 355 mg   Potato, boiled, 1 medium / 515 mg   Prunes, dried, uncooked,  cup / 318 mg   Raisins,  cup / 309 mg   Salmon, pink, raw, 3 oz / 275 mg   Sardines, canned , 3 oz / 338 mg   Tomato, raw, 1 medium / 292 mg   Tomato juice, 6 oz / 417 mg   Turkey, 3 oz / 349 mg  Document Released: 04/09/2005 Document Revised: 12/20/2010 Document Reviewed: 08/23/2008 [employer] Patient Information 2012 [employer]. 

## 2012-11-19 ENCOUNTER — Ambulatory Visit: Payer: BC Managed Care – PPO

## 2012-11-20 ENCOUNTER — Other Ambulatory Visit (HOSPITAL_COMMUNITY): Payer: BC Managed Care – PPO

## 2012-11-20 ENCOUNTER — Ambulatory Visit (HOSPITAL_COMMUNITY)
Admission: RE | Admit: 2012-11-20 | Discharge: 2012-11-20 | Disposition: A | Discharge status: Home / Self Care | Payer: BC Managed Care – PPO | Source: Ambulatory Visit | Attending: Adult Health | Admitting: Adult Health

## 2012-11-20 ENCOUNTER — Encounter: Payer: Self-pay | Admitting: Oncology

## 2012-11-20 DIAGNOSIS — R188 Other ascites: Secondary | ICD-10-CM | POA: Insufficient documentation

## 2012-11-20 DIAGNOSIS — C541 Malignant neoplasm of endometrium: Secondary | ICD-10-CM

## 2012-11-20 NOTE — Progress Notes (Signed)
Mom came in. I gave her billing ph# to have payment plan combined. She is overwhelmed with the copays for her dr visits. She is not working, but is still getting paid, so has an income and insurance. The mom is not sure of what her hosp bills are.

## 2012-11-20 NOTE — Procedures (Signed)
Successful US guided paracentesis from [facility].  Yielded 6L of amber colored fluid.  No immediate complications.  Pt tolerated well.   Specimen was not sent for labs.  Brayton El PA-C 11/20/2012 3:17 PM

## 2012-11-24 ENCOUNTER — Ambulatory Visit (HOSPITAL_COMMUNITY)
Admission: RE | Admit: 2012-11-24 | Discharge: 2012-11-24 | Disposition: A | Discharge status: Home / Self Care | Payer: BC Managed Care – PPO | Source: Ambulatory Visit | Attending: Adult Health | Admitting: Adult Health

## 2012-11-24 DIAGNOSIS — R188 Other ascites: Secondary | ICD-10-CM | POA: Insufficient documentation

## 2012-11-24 DIAGNOSIS — C541 Malignant neoplasm of endometrium: Secondary | ICD-10-CM

## 2012-11-24 DIAGNOSIS — C786 Secondary malignant neoplasm of retroperitoneum and peritoneum: Secondary | ICD-10-CM | POA: Insufficient documentation

## 2012-11-24 NOTE — Procedures (Signed)
Successful US guided paracentesis from [facility].  Yielded 6.8L of amber colored fluid.  No immediate complications.  Pt tolerated well.   Specimen was not sent for labs.  Brayton El PA-C 11/24/2012 12:55 PM

## 2012-11-25 ENCOUNTER — Ambulatory Visit (HOSPITAL_BASED_OUTPATIENT_CLINIC_OR_DEPARTMENT_OTHER): Payer: BC Managed Care – PPO

## 2012-11-25 ENCOUNTER — Ambulatory Visit (HOSPITAL_BASED_OUTPATIENT_CLINIC_OR_DEPARTMENT_OTHER): Payer: BC Managed Care – PPO | Admitting: Adult Health

## 2012-11-25 ENCOUNTER — Other Ambulatory Visit (HOSPITAL_BASED_OUTPATIENT_CLINIC_OR_DEPARTMENT_OTHER): Payer: BC Managed Care – PPO | Admitting: Lab

## 2012-11-25 ENCOUNTER — Telehealth: Payer: Self-pay | Admitting: Oncology

## 2012-11-25 ENCOUNTER — Encounter: Payer: Self-pay | Admitting: Adult Health

## 2012-11-25 VITALS — BP 106/68 | HR 96 | Temp 97.6°F | Resp 20 | Ht 71.0 in | Wt >= 6400 oz

## 2012-11-25 DIAGNOSIS — C786 Secondary malignant neoplasm of retroperitoneum and peritoneum: Secondary | ICD-10-CM

## 2012-11-25 DIAGNOSIS — C549 Malignant neoplasm of corpus uteri, unspecified: Secondary | ICD-10-CM

## 2012-11-25 DIAGNOSIS — Z5111 Encounter for antineoplastic chemotherapy: Secondary | ICD-10-CM

## 2012-11-25 DIAGNOSIS — C541 Malignant neoplasm of endometrium: Secondary | ICD-10-CM

## 2012-11-25 DIAGNOSIS — R188 Other ascites: Secondary | ICD-10-CM

## 2012-11-25 LAB — CBC WITH DIFFERENTIAL/PLATELET
Basophils Absolute: 0.1 10*3/uL (ref 0.0–0.1)
Eosinophils Absolute: 0.1 10*3/uL (ref 0.0–0.5)
HGB: 9.4 g/dL — ABNORMAL LOW (ref 11.6–15.9)
LYMPH%: 18 % (ref 14.0–49.7)
MCV: 78.7 fL — ABNORMAL LOW (ref 79.5–101.0)
MONO#: 1 10*3/uL — ABNORMAL HIGH (ref 0.1–0.9)
MONO%: 14 % (ref 0.0–14.0)
NEUT#: 4.6 10*3/uL (ref 1.5–6.5)
Platelets: 327 10*3/uL (ref 145–400)
RDW: 18.2 % — ABNORMAL HIGH (ref 11.2–14.5)

## 2012-11-25 LAB — COMPREHENSIVE METABOLIC PANEL (CC13)
Albumin: 1.5 g/dL — ABNORMAL LOW (ref 3.5–5.0)
Alkaline Phosphatase: 67 U/L (ref 40–150)
BUN: 8.5 mg/dL (ref 7.0–26.0)
CO2: 29 mEq/L (ref 22–29)
Glucose: 96 mg/dl (ref 70–140)
Potassium: 3.4 mEq/L — ABNORMAL LOW (ref 3.5–5.1)

## 2012-11-25 MED ORDER — SODIUM CHLORIDE 0.9 % IV SOLN
900.0000 mg | Freq: Once | INTRAVENOUS | Status: AC
  Administered 2012-11-25: 900 mg via INTRAVENOUS
  Filled 2012-11-25: qty 90

## 2012-11-25 MED ORDER — SODIUM CHLORIDE 0.9 % IJ SOLN
10.0000 mL | INTRAMUSCULAR | Status: DC | PRN
  Administered 2012-11-25: 10 mL
  Filled 2012-11-25: qty 10

## 2012-11-25 MED ORDER — DEXAMETHASONE SODIUM PHOSPHATE 20 MG/5ML IJ SOLN
12.0000 mg | Freq: Once | INTRAMUSCULAR | Status: AC
  Administered 2012-11-25: 12 mg via INTRAVENOUS

## 2012-11-25 MED ORDER — PALONOSETRON HCL INJECTION 0.25 MG/5ML
0.2500 mg | Freq: Once | INTRAVENOUS | Status: AC
  Administered 2012-11-25: 0.25 mg via INTRAVENOUS

## 2012-11-25 MED ORDER — FOSAPREPITANT DIMEGLUMINE INJECTION 150 MG
150.0000 mg | Freq: Once | INTRAVENOUS | Status: AC
  Administered 2012-11-25: 150 mg via INTRAVENOUS
  Filled 2012-11-25: qty 5

## 2012-11-25 MED ORDER — PACLITAXEL CHEMO INJECTION 300 MG/50ML
175.0000 mg/m2 | Freq: Once | INTRAVENOUS | Status: AC
  Administered 2012-11-25: 618 mg via INTRAVENOUS
  Filled 2012-11-25: qty 103

## 2012-11-25 MED ORDER — HEPARIN SOD (PORK) LOCK FLUSH 100 UNIT/ML IV SOLN
500.0000 [IU] | Freq: Once | INTRAVENOUS | Status: AC | PRN
  Administered 2012-11-25: 500 [IU]
  Filled 2012-11-25: qty 5

## 2012-11-25 MED ORDER — DIPHENHYDRAMINE HCL 50 MG/ML IJ SOLN
50.0000 mg | Freq: Once | INTRAMUSCULAR | Status: AC
  Administered 2012-11-25: 50 mg via INTRAVENOUS

## 2012-11-25 MED ORDER — SODIUM CHLORIDE 0.9 % IV SOLN
Freq: Once | INTRAVENOUS | Status: AC
  Administered 2012-11-25: 10:00:00 via INTRAVENOUS

## 2012-11-25 MED ORDER — FAMOTIDINE IN NACL 20-0.9 MG/50ML-% IV SOLN
20.0000 mg | Freq: Once | INTRAVENOUS | Status: AC
  Administered 2012-11-25: 20 mg via INTRAVENOUS

## 2012-11-25 NOTE — Patient Instructions (Addendum)
[facility] Discharge Instructions for Patients Receiving Chemotherapy  Today you received the following chemotherapy agents: Taxol, Carboplatin  To help prevent nausea and vomiting after your treatment, we encourage you to take your nausea medication as instructed by your medical provider.    If you develop nausea and vomiting that is not controlled by your nausea medication, call the clinic.   BELOW ARE SYMPTOMS THAT SHOULD BE REPORTED IMMEDIATELY:  *FEVER GREATER THAN 100.5 F  *CHILLS WITH OR WITHOUT FEVER  NAUSEA AND VOMITING THAT IS NOT CONTROLLED WITH YOUR NAUSEA MEDICATION  *UNUSUAL SHORTNESS OF BREATH  *UNUSUAL BRUISING OR BLEEDING  TENDERNESS IN MOUTH AND THROAT WITH OR WITHOUT PRESENCE OF ULCERS  *URINARY PROBLEMS  *BOWEL PROBLEMS  UNUSUAL RASH Items with * indicate a potential emergency and should be followed up as soon as possible.  Feel free to call the clinic you have any questions or concerns. The clinic phone number is [phone].

## 2012-11-25 NOTE — Progress Notes (Signed)
OFFICE PROGRESS NOTE  CC  Catherine Rua, MD [address] [place] [address] Dr. Laurette Schimke  DIAGNOSIS: 47 year old female with prior history of stage I a uterine cancer diagnosed in 2010. Now with recurrent disease diagnosed in May 2014   PRIOR THERAPY:  #1Patient subsequently developed excessive uterine bleeding. Bad tablet to dilatation and curettage with hysteroscopy. 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 Due to morbid obesity Mirena IUD was placed along with the addition of Aygestin. Patient had a dramatic directed weight loss with nutritional support she went from 523 pounds to 359 pounds in July of 2011. Patient continued to do well and is 12/27/2009 she at that time underwent a robotic-assisted laparoscopic hysterectomy, bilateral salpingo-oophorectomy, and mini laparotomy through the umbilical port with morcellation of uterus within about the delivery of the uterus. The final pathology did reveal an endometrial adenocarcinoma grade 2 with invasion limited to 1 mm of the myometrium  #2continued to do well until June 2014 when she developed right upper quadrant pain. It was presumed to be cholelithiasis. 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 Omental biopsies were obtained. The pathology was consistent with metastatic adenocarcinoma in the presumption of recurrent endometrial carcinoma was performed made. She was seen by Dr. Laurette Schimke on 10/07/2012. She was recommended that she undergo chemotherapy consisting of Taxol and carboplatinum  #3 abdominal ascites: Multiple paracenteses  CURRENT THERAPY: Taxol/Carboplatinum cycle 2 day 1  INTERVAL HISTORY: [name] 47 y.o. female returns for followup visit today of her endometrial cancer.  She is doing well today.  She had a paracentesis on  Thursday, July 31 removing 6 liters, and Monday, August 4 removing 6.8 liters.  She is requesting paracentesis on Fridays and Tuesdays.  She denies fevers, chills, nausea, vomiting, numbness, or any other concerns.  Otherwise a 10 point ROS is negative.    MEDICAL HISTORY: Past Medical History  Diagnosis Date  . Uterine cancer   . Obesity   . DVT (deep venous thrombosis) 2009  . PONV (postoperative nausea and vomiting)   . Swelling     BOTH LEGS  . Rash     LOWER ABD  . Gallstones   . Ascites     ALLERGIES:  has No Known Allergies.  MEDICATIONS:  Current Outpatient Prescriptions  Medication Sig Dispense Refill  . dexamethasone (DECADRON) 4 MG tablet Take 2 tablets (8 mg total) by mouth 2 (two) times daily with a meal. Take two times a day starting the day after chemotherapy for 3 days.  30 tablet  1  . lidocaine-prilocaine (EMLA) cream Apply topically as needed. Apply to port-a-cath site 1-2 hours prior to treatment.  30 g  2  . loratadine (CLARITIN) 10 MG tablet Take 10 mg by mouth daily.      Marland Kitchen LORazepam (ATIVAN) 0.5 MG tablet Take 1 tablet (0.5 mg total) by mouth every 6 (six) hours as needed (Nausea or vomiting).  30 tablet  0  . ondansetron (ZOFRAN) 8 MG tablet Take 1 tablet (8 mg total) by mouth 2 (two) times daily. Take two times a day starting the day after chemo for 3 days. Then take two times a day as needed for nausea or vomiting.  30 tablet  1  . oxyCODONE-acetaminophen (PERCOCET) 10-325 MG per tablet Take 1 tablet by mouth every 4 (four) hours as needed  for pain.  90 tablet  0  . pegfilgrastim (NEULASTA) 6 MG/0.6ML injection Inject 6 mg into the skin every 21 ( twenty-one) days.      Marland Kitchen PRESCRIPTION MEDICATION every 21 ( twenty-one) days. CHEMOTHERAPY REGIMEN      . prochlorperazine (COMPAZINE) 10 MG tablet Take 1 tablet (10 mg total) by mouth every 6 (six) hours as needed (Nausea or vomiting).  30 tablet  1  . triamterene-hydrochlorothiazide (MAXZIDE-25) 37.5-25 MG per  tablet Take 1 tablet by mouth every morning.       . Ascorbic Acid (VITAMIN C) 1000 MG tablet Take 1,000 mg by mouth daily.      . ciprofloxacin (CIPRO) 500 MG tablet Take 1 tablet (500 mg total) by mouth 2 (two) times daily.  14 tablet  0  . Cyanocobalamin (VITAMIN B-12 PO) Take 2,500 mg by mouth daily.       . folic acid (FOLVITE) 400 MCG tablet Take 400 mcg by mouth daily.        . magnesium gluconate (MAGONATE) 500 MG tablet Take 500 mg by mouth daily.      . Multiple Vitamins-Minerals (MULTIVITAMIN WITH MINERALS) tablet Take 1 tablet by mouth daily.       . naproxen sodium (ANAPROX) 220 MG tablet Take 880 mg by mouth every morning.        No current facility-administered medications for this visit.    SURGICAL HISTORY:  Past Surgical History  Procedure Laterality Date  . Ankle surgery  1950  . Wisdom tooth extraction    . Tonsillectomy and adenoidectomy    . Hysteroscopy w/d&c    . Abdominal hysterectomy  12/2009    [facility], [facility]  . Laparoscopy N/A 10/02/2012    Procedure: LAPAROSCOPY DIAGNOSTIC, perocentisis, omental biopsy;  Surgeon: Wilmon Arms. Tsuei, MD;  Location: [facility];  Service: General;  Laterality: N/A;  removed a total of 15,000 of acities  . Paracentesis      REVIEW OF SYSTEMS:  Pertinent items are noted in HPI.   HEALTH MAINTENANCE:  PHYSICAL EXAMINATION: Blood pressure 106/68, pulse 96, temperature 97.6 F (36.4 C), temperature source Oral, resp. rate 20, height 5\' 11"  (1.803 m), weight 476 lb 11.2 oz (216.23 kg). Body mass index is 66.52 kg/(m^2). General: Patient is a morbidly obese well appearing female in no acute distress HEENT: PERRLA, sclerae anicteric no conjunctival pallor, MMM Neck: supple, no palpable adenopathy Lungs: clear to auscultation bilaterally, no wheezes, rhonchi, or rales Cardiovascular: regular rate rhythm, S1, S2, no murmurs, rubs or gallops Abdomen: Soft, non-tender, non-distended, normoactive bowel sounds, no HSM Extremities: warm and well  perfused, no clubbing, cyanosis, or edema Skin: No rashes or lesions Neuro: Non-focal ECOG PERFORMANCE STATUS: 2 - Symptomatic, <50% confined to bed   LABORATORY DATA: Lab Results  Component Value Date   WBC 6.9 11/25/2012   HGB 9.4* 11/25/2012   HCT 30.2* 11/25/2012   MCV 78.7* 11/25/2012   PLT 327 11/25/2012      Chemistry      Component Value Date/Time   NA 137 11/18/2012 1048   NA 135 10/31/2012 0655   K 3.4* 11/18/2012 1048   K 3.5 10/31/2012 0655   CL 97 10/31/2012 0655   CL 98 10/07/2012 0923   CO2 30* 11/18/2012 1048   CO2 29 10/31/2012 0655   BUN 7.8 11/18/2012 1048   BUN 8 10/31/2012 0655   CREATININE 0.7 11/18/2012 1048   CREATININE 0.78 10/31/2012 0655      Component Value Date/Time  CALCIUM 8.2* 11/18/2012 1048   CALCIUM 8.3* 10/31/2012 0655   ALKPHOS 74 11/18/2012 1048   ALKPHOS 55 09/09/2012 1315   AST 18 11/18/2012 1048   AST 25 09/09/2012 1315   ALT 9 11/18/2012 1048   ALT 17 09/09/2012 1315   BILITOT 0.39 11/18/2012 1048   BILITOT 0.4 09/09/2012 1315       RADIOGRAPHIC STUDIES:  Ct Chest W Contrast  10/21/2012   *RADIOLOGY REPORT*  Clinical Data:  Recurrent endometrial cancer.  CT CHEST, ABDOMEN AND PELVIS WITH CONTRAST  Technique: Contiguous axial images of the chest abdomen and pelvis were obtained after IV contrast administration.  Contrast: 80 ml Omnipaque-300  Comparison: Abdominal pelvic CT of 02/11/2009.  No prior chest CT. Plain film chest of 12/23/2009 is reviewed.  CT CHEST  Findings: Lung windows demonstrate moderate degradation secondary to motion and patient body habitus. No airspace opacities.  Soft tissue windows demonstrate no definite supraclavicular adenopathy.  Mild cardiomegaly, without pericardial or pleural effusion.  No gross mediastinal or hilar adenopathy.  Tiny hiatal hernia.  IMPRESSION:  1.  Moderate degradation secondary to motion and patient body habitus. 2.  Given this factor, no acute process or evidence of metastatic disease within the chest.  CT  ABDOMEN AND PELVIS  Findings:  Moderate to marked degradation involving the abdomen. Nondiagnostic evaluation of the pelvis.  Possible hyperenhancing nodule in the hepatic dome 1.7 cm on image 34/series 2.  Grossly normal spleen, distal stomach.  Pancreas not well evaluated.  Gallbladder not evaluated.  Grossly normal adrenal glands and kidneys.  No gross retroperitoneal abdominal adenopathy.  A moderate amount of abdominal ascites.  No gross bowel obstruction.    Pelvic adenopathy cannot be evaluated.  Moderate volume pelvic free fluid.  No gross osseous abnormality.  IMPRESSION:  1.  Moderate to markedly limited evaluation of the abdomen secondary patient body habitus.  Nondiagnostic evaluation of the pelvis. 2.  Moderate volume abdominal pelvic ascites.  No gross retroperitoneal abdominal adenopathy. 3.  Possible hepatic dome nodule, indeterminate.  Not readily apparent on the prior of 2010.  As the patient is not a candidate for MRI, this could be reevaluated on follow-up CTs.   Original Report Authenticated By: Jeronimo Greaves, M.D.   Ct Abdomen Pelvis W Contrast  10/21/2012   *RADIOLOGY REPORT*  Clinical Data:  Recurrent endometrial cancer.  CT CHEST, ABDOMEN AND PELVIS WITH CONTRAST  Technique: Contiguous axial images of the chest abdomen and pelvis were obtained after IV contrast administration.  Contrast: 80 ml Omnipaque-300  Comparison: Abdominal pelvic CT of 02/11/2009.  No prior chest CT. Plain film chest of 12/23/2009 is reviewed.  CT CHEST  Findings: Lung windows demonstrate moderate degradation secondary to motion and patient body habitus. No airspace opacities.  Soft tissue windows demonstrate no definite supraclavicular adenopathy.  Mild cardiomegaly, without pericardial or pleural effusion.  No gross mediastinal or hilar adenopathy.  Tiny hiatal hernia.  IMPRESSION:  1.  Moderate degradation secondary to motion and patient body habitus. 2.  Given this factor, no acute process or evidence of  metastatic disease within the chest.  CT ABDOMEN AND PELVIS  Findings:  Moderate to marked degradation involving the abdomen. Nondiagnostic evaluation of the pelvis.  Possible hyperenhancing nodule in the hepatic dome 1.7 cm on image 34/series 2.  Grossly normal spleen, distal stomach.  Pancreas not well evaluated.  Gallbladder not evaluated.  Grossly normal adrenal glands and kidneys.  No gross retroperitoneal abdominal adenopathy.  A moderate amount of abdominal ascites.  No gross bowel obstruction.    Pelvic adenopathy cannot be evaluated.  Moderate volume pelvic free fluid.  No gross osseous abnormality.  IMPRESSION:  1.  Moderate to markedly limited evaluation of the abdomen secondary patient body habitus.  Nondiagnostic evaluation of the pelvis. 2.  Moderate volume abdominal pelvic ascites.  No gross retroperitoneal abdominal adenopathy. 3.  Possible hepatic dome nodule, indeterminate.  Not readily apparent on the prior of 2010.  As the patient is not a candidate for MRI, this could be reevaluated on follow-up CTs.   Original Report Authenticated By: Jeronimo Greaves, M.D.   US Paracentesis  10/28/2012   *RADIOLOGY REPORT*  Clinical Data: Recurrent uterine cancer, abdominal distension and discomfort secondary to ascites.  ULTRASOUND GUIDED PARACENTESIS  Comparison:  Previous paracentesis  An ultrasound guided paracentesis was thoroughly discussed with the patient and questions answered.  The benefits, risks, alternatives and complications were also discussed.  The patient understands and wishes to proceed with the procedure.  Written consent was obtained.  Ultrasound was performed to localize and mark an adequate pocket of fluid in the left upper quadrant of the abdomen.  The area was then prepped and draped in the normal sterile fashion.  1% Lidocaine was used for local anesthesia.  Under ultrasound guidance a 19 gauge Yueh catheter was introduced.  Paracentesis was performed.  The catheter was removed and a  dressing applied.  Complications:  None  Findings:  A total of approximately 10 liters of dark amber-colored fluid was removed.  A fluid sample was not sent for laboratory analysis.  IMPRESSION: Successful ultrasound guided paracentesis yielding 10 liters of ascites.  Read by Brayton El PA-C   Original Report Authenticated By: Richarda Overlie, M.D.   US Paracentesis  10/14/2012   *RADIOLOGY REPORT*  Clinical Data: Recurrent uterine cancer, abdominal distension and discomfort secondary to ascites.  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 Patient will be treated with Taxol and carboplatinum palliatively. Port a cath was placed in the OR, and patient will proceed with chemotherapy beginning 11/04/12.  #2 Ascites: Patient will undergo weekly paracenteses, we will evaluate the necessity of these at every appointment.     PLAN:   #1 Ms. Mormino is doing well today.  Will proceed with chemotherapy.      #2 We will schedule paracenteses for Fridays and Mondays.  I did have a long discussion with her regarding infection risk should she have a paracentesis when she is neutropenic.  She states that she is willing to take the risk due to the severe and extreme discomfort of the ascites.    #3 She will return in one week for labs and an evaluation.    All questions were answered. The patient knows to call the clinic with any problems, questions or concerns. We can certainly see the patient much sooner if necessary.  I spent 25 minutes counseling the patient face to face. The total time spent in the appointment was 30 minutes.  Cherie Ouch Lyn Hollingshead, NP Medical Oncology [facility] Phone: [phone]

## 2012-11-25 NOTE — Patient Instructions (Signed)
Doing well.  Proceed with chemotherapy.  Please call us if you have any questions or concerns.    

## 2012-11-26 ENCOUNTER — Ambulatory Visit (HOSPITAL_BASED_OUTPATIENT_CLINIC_OR_DEPARTMENT_OTHER): Payer: BC Managed Care – PPO

## 2012-11-26 VITALS — BP 93/54 | HR 72 | Temp 97.2°F

## 2012-11-26 DIAGNOSIS — Z5189 Encounter for other specified aftercare: Secondary | ICD-10-CM

## 2012-11-26 DIAGNOSIS — C541 Malignant neoplasm of endometrium: Secondary | ICD-10-CM

## 2012-11-26 DIAGNOSIS — C549 Malignant neoplasm of corpus uteri, unspecified: Secondary | ICD-10-CM

## 2012-11-26 MED ORDER — PEGFILGRASTIM INJECTION 6 MG/0.6ML
6.0000 mg | Freq: Once | SUBCUTANEOUS | Status: AC
  Administered 2012-11-26: 6 mg via SUBCUTANEOUS
  Filled 2012-11-26: qty 0.6

## 2012-11-27 ENCOUNTER — Other Ambulatory Visit: Payer: Self-pay | Admitting: Adult Health

## 2012-11-27 ENCOUNTER — Other Ambulatory Visit (HOSPITAL_COMMUNITY): Payer: BC Managed Care – PPO

## 2012-11-27 DIAGNOSIS — R188 Other ascites: Secondary | ICD-10-CM

## 2012-11-28 ENCOUNTER — Ambulatory Visit (HOSPITAL_COMMUNITY)
Admission: RE | Admit: 2012-11-28 | Discharge: 2012-11-28 | Disposition: A | Discharge status: Home / Self Care | Payer: BC Managed Care – PPO | Source: Ambulatory Visit | Attending: Adult Health | Admitting: Adult Health

## 2012-11-28 ENCOUNTER — Other Ambulatory Visit: Payer: Self-pay | Admitting: Adult Health

## 2012-11-28 ENCOUNTER — Ambulatory Visit (HOSPITAL_COMMUNITY): Payer: BC Managed Care – PPO

## 2012-11-28 ENCOUNTER — Telehealth: Payer: Self-pay | Admitting: Medical Oncology

## 2012-11-28 DIAGNOSIS — R188 Other ascites: Secondary | ICD-10-CM | POA: Insufficient documentation

## 2012-11-28 NOTE — Telephone Encounter (Signed)
Pt LVMOM stating having diarrhea "pretty bad every time she eats" asking if there is anything she can do. Reviewed with NP, patient to take OTC Immodium, no more than 60 mg in a 24 hours, drink lots of fluids and to call office should symptoms worsen. Verbal understanding expressed. No further questions at this time.

## 2012-11-28 NOTE — Procedures (Signed)
Unsuccessful US guided paracentesis from [facility].  Multiple attempts but unable to aspirate fluid No immediate complications.  Pt tolerated well.    Brayton El PA-C 11/28/2012 11:50 AM

## 2012-12-01 ENCOUNTER — Telehealth: Payer: Self-pay | Admitting: *Deleted

## 2012-12-01 NOTE — Telephone Encounter (Signed)
Pt called [facility] states " I need my lorazapam and oxycodone refilled. i dont have many left." Ativan 0.5mg  q 6hrs Q 30 last filled on 07/29 Oxycodone 10-325mg  1 q 4Hrs Q 90 last filled 11/18/12. Will review with MD Next f/u 08/12 with NP

## 2012-12-02 ENCOUNTER — Other Ambulatory Visit (HOSPITAL_BASED_OUTPATIENT_CLINIC_OR_DEPARTMENT_OTHER): Payer: BC Managed Care – PPO | Admitting: Lab

## 2012-12-02 ENCOUNTER — Ambulatory Visit (HOSPITAL_BASED_OUTPATIENT_CLINIC_OR_DEPARTMENT_OTHER): Payer: BC Managed Care – PPO | Admitting: Adult Health

## 2012-12-02 ENCOUNTER — Ambulatory Visit (HOSPITAL_BASED_OUTPATIENT_CLINIC_OR_DEPARTMENT_OTHER): Payer: BC Managed Care – PPO

## 2012-12-02 ENCOUNTER — Encounter: Payer: Self-pay | Admitting: Adult Health

## 2012-12-02 ENCOUNTER — Ambulatory Visit (HOSPITAL_COMMUNITY): Admission: RE | Admit: 2012-12-02 | Payer: BC Managed Care – PPO | Source: Ambulatory Visit

## 2012-12-02 VITALS — BP 100/63 | HR 98 | Temp 98.3°F | Resp 20 | Ht 71.0 in | Wt >= 6400 oz

## 2012-12-02 DIAGNOSIS — D509 Iron deficiency anemia, unspecified: Secondary | ICD-10-CM

## 2012-12-02 DIAGNOSIS — E876 Hypokalemia: Secondary | ICD-10-CM

## 2012-12-02 DIAGNOSIS — C549 Malignant neoplasm of corpus uteri, unspecified: Secondary | ICD-10-CM

## 2012-12-02 DIAGNOSIS — D709 Neutropenia, unspecified: Secondary | ICD-10-CM

## 2012-12-02 DIAGNOSIS — C541 Malignant neoplasm of endometrium: Secondary | ICD-10-CM

## 2012-12-02 DIAGNOSIS — R110 Nausea: Secondary | ICD-10-CM

## 2012-12-02 DIAGNOSIS — E860 Dehydration: Secondary | ICD-10-CM

## 2012-12-02 LAB — COMPREHENSIVE METABOLIC PANEL (CC13)
ALT: 13 U/L (ref 0–55)
AST: 18 U/L (ref 5–34)
Albumin: 2.3 g/dL — ABNORMAL LOW (ref 3.5–5.0)
CO2: 28 mEq/L (ref 22–29)
Calcium: 9.1 mg/dL (ref 8.4–10.4)
Chloride: 98 mEq/L (ref 98–109)
Potassium: 3.3 mEq/L — ABNORMAL LOW (ref 3.5–5.1)
Total Protein: 6.3 g/dL — ABNORMAL LOW (ref 6.4–8.3)

## 2012-12-02 LAB — CBC WITH DIFFERENTIAL/PLATELET
BASO%: 1.2 % (ref 0.0–2.0)
EOS%: 6.6 % (ref 0.0–7.0)
HGB: 8.5 g/dL — ABNORMAL LOW (ref 11.6–15.9)
MCH: 25 pg — ABNORMAL LOW (ref 25.1–34.0)
MCHC: 32.3 g/dL (ref 31.5–36.0)
MONO#: 0.2 10*3/uL (ref 0.1–0.9)
RDW: 18.8 % — ABNORMAL HIGH (ref 11.2–14.5)
WBC: 1.6 10*3/uL — ABNORMAL LOW (ref 3.9–10.3)
lymph#: 0.8 10*3/uL — ABNORMAL LOW (ref 0.9–3.3)

## 2012-12-02 MED ORDER — OXYCODONE-ACETAMINOPHEN 10-325 MG PO TABS: 1.0000 | ORAL_TABLET | ORAL | Status: DC | PRN

## 2012-12-02 MED ORDER — PROMETHAZINE HCL 12.5 MG PO TABS: 12.5000 mg | ORAL_TABLET | Freq: Four times a day (QID) | ORAL | Status: DC | PRN

## 2012-12-02 MED ORDER — LORAZEPAM 0.5 MG PO TABS: 0.5000 mg | ORAL_TABLET | Freq: Four times a day (QID) | ORAL | Status: DC | PRN

## 2012-12-02 MED ORDER — FERROUS SULFATE 325 (65 FE) MG PO TBEC: 325.0000 mg | DELAYED_RELEASE_TABLET | Freq: Three times a day (TID) | ORAL | Status: DC

## 2012-12-02 MED ORDER — POTASSIUM CHLORIDE ER 10 MEQ PO TBCR: 10.0000 meq | EXTENDED_RELEASE_TABLET | Freq: Two times a day (BID) | ORAL | Status: DC

## 2012-12-02 MED ORDER — CIPROFLOXACIN HCL 500 MG PO TABS: 500.0000 mg | ORAL_TABLET | Freq: Two times a day (BID) | ORAL | Status: DC

## 2012-12-02 MED ORDER — SODIUM CHLORIDE 0.9 % IV SOLN
Freq: Once | INTRAVENOUS | Status: AC
  Administered 2012-12-02: 13:00:00 via INTRAVENOUS

## 2012-12-02 MED ORDER — SODIUM CHLORIDE 0.9 % IJ SOLN
10.0000 mL | INTRAMUSCULAR | Status: DC | PRN
  Administered 2012-12-02: 10 mL via INTRAVENOUS
  Filled 2012-12-02: qty 10

## 2012-12-02 MED ORDER — HEPARIN SOD (PORK) LOCK FLUSH 100 UNIT/ML IV SOLN
500.0000 [IU] | Freq: Once | INTRAVENOUS | Status: AC
  Administered 2012-12-02: 500 [IU] via INTRAVENOUS
  Filled 2012-12-02: qty 5

## 2012-12-02 NOTE — Progress Notes (Signed)
OFFICE PROGRESS NOTE  CC  [name], MD [address] [place] [address] Dr. Laurette Schimke  DIAGNOSIS: 47 year old female with prior history of stage I a uterine cancer diagnosed in 2010. Now with recurrent disease diagnosed in May 2014   PRIOR THERAPY:  #1Patient subsequently developed excessive uterine bleeding. Bad tablet to dilatation and curettage with hysteroscopy. 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 Due to morbid obesity Mirena IUD was placed along with the addition of Aygestin. Patient had a dramatic directed weight loss with nutritional support she went from 523 pounds to 359 pounds in July of 2011. Patient continued to do well and is 12/27/2009 she at that time underwent a robotic-assisted laparoscopic hysterectomy, bilateral salpingo-oophorectomy, and mini laparotomy through the umbilical port with morcellation of uterus within about the delivery of the uterus. The final pathology did reveal an endometrial adenocarcinoma grade 2 with invasion limited to 1 mm of the myometrium  #2continued to do well until June 2014 when she developed right upper quadrant pain. It was presumed to be cholelithiasis. 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 Omental biopsies were obtained. The pathology was consistent with metastatic adenocarcinoma in the presumption of recurrent endometrial carcinoma was performed made. She was seen by Dr. Laurette Schimke on 10/07/2012. She was recommended that she undergo chemotherapy consisting of Taxol and carboplatinum  #3 abdominal ascites: Multiple paracenteses  CURRENT THERAPY: Taxol/Carboplatinum cycle 2 day 8  INTERVAL HISTORY: [name] 47 y.o. female returns for followup visit today of her endometrial cancer.  She had nausea intermittently throughout the week.  It  is not relieved by the anti-emetics very well.  She has also had diarrhea this week and has been taking Imodium TID.  She had an attempted paracentesis on Friday, where no ascitic fluid could be drained.  She denies fevers, chills, vomiting, constipation, numbness, or further concerns.    MEDICAL HISTORY: Past Medical History  Diagnosis Date  . Uterine cancer   . Obesity   . DVT (deep venous thrombosis) 2009  . PONV (postoperative nausea and vomiting)   . Swelling     BOTH LEGS  . Rash     LOWER ABD  . Gallstones   . Ascites     ALLERGIES:  has No Known Allergies.  MEDICATIONS:  Current Outpatient Prescriptions  Medication Sig Dispense Refill  . Ascorbic Acid (VITAMIN C) 1000 MG tablet Take 1,000 mg by mouth daily.      . ciprofloxacin (CIPRO) 500 MG tablet Take 1 tablet (500 mg total) by mouth 2 (two) times daily.  14 tablet  0  . Cyanocobalamin (VITAMIN B-12 PO) Take 2,500 mg by mouth daily.       Marland Kitchen dexamethasone (DECADRON) 4 MG tablet Take 2 tablets (8 mg total) by mouth 2 (two) times daily with a meal. Take two times a day starting the day after chemotherapy for 3 days.  30 tablet  1  . folic acid (FOLVITE) 400 MCG tablet Take 400 mcg by mouth daily.        Marland Kitchen lidocaine-prilocaine (EMLA) cream Apply topically as needed. Apply to port-a-cath site 1-2 hours prior to treatment.  30 g  2  . loratadine (CLARITIN) 10 MG tablet Take 10 mg by mouth daily.      Marland Kitchen LORazepam (ATIVAN) 0.5 MG tablet Take 1 tablet (0.5 mg total)  by mouth every 6 (six) hours as needed (Nausea or vomiting).  30 tablet  0  . magnesium gluconate (MAGONATE) 500 MG tablet Take 500 mg by mouth daily.      . Multiple Vitamins-Minerals (MULTIVITAMIN WITH MINERALS) tablet Take 1 tablet by mouth daily.       . naproxen sodium (ANAPROX) 220 MG tablet Take 880 mg by mouth every morning.       . ondansetron (ZOFRAN) 8 MG tablet Take 1 tablet (8 mg total) by mouth 2 (two) times daily. Take two times a day starting the day  after chemo for 3 days. Then take two times a day as needed for nausea or vomiting.  30 tablet  1  . oxyCODONE-acetaminophen (PERCOCET) 10-325 MG per tablet Take 1 tablet by mouth every 4 (four) hours as needed for pain.  90 tablet  0  . pegfilgrastim (NEULASTA) 6 MG/0.6ML injection Inject 6 mg into the skin every 21 ( twenty-one) days.      Marland Kitchen PRESCRIPTION MEDICATION every 21 ( twenty-one) days. CHEMOTHERAPY REGIMEN      . prochlorperazine (COMPAZINE) 10 MG tablet Take 1 tablet (10 mg total) by mouth every 6 (six) hours as needed (Nausea or vomiting).  30 tablet  1  . triamterene-hydrochlorothiazide (MAXZIDE-25) 37.5-25 MG per tablet Take 1 tablet by mouth every morning.       . ferrous sulfate 325 (65 FE) MG EC tablet Take 1 tablet (325 mg total) by mouth 3 (three) times daily with meals.  90 tablet  0  . potassium chloride (K-DUR) 10 MEQ tablet Take 1 tablet (10 mEq total) by mouth 2 (two) times daily.  14 tablet  0  . promethazine (PHENERGAN) 12.5 MG tablet Take 1 tablet (12.5 mg total) by mouth every 6 (six) hours as needed for nausea.  30 tablet  0   No current facility-administered medications for this visit.    SURGICAL HISTORY:  Past Surgical History  Procedure Laterality Date  . Ankle surgery  1950  . Wisdom tooth extraction    . Tonsillectomy and adenoidectomy    . Hysteroscopy w/d&c    . Abdominal hysterectomy  12/2009    [facility], [facility]  . Laparoscopy N/A 10/02/2012    Procedure: LAPAROSCOPY DIAGNOSTIC, perocentisis, omental biopsy;  Surgeon: Wilmon Arms. Tsuei, MD;  Location: [facility];  Service: General;  Laterality: N/A;  removed a total of 15,000 of acities  . Paracentesis      REVIEW OF SYSTEMS:  Pertinent items are noted in HPI.   HEALTH MAINTENANCE:  PHYSICAL EXAMINATION: Blood pressure 100/63, pulse 98, temperature 98.3 F (36.8 C), temperature source Oral, resp. rate 20, height 5\' 11"  (1.803 m), weight 452 lb 1.6 oz (205.071 kg). Body mass index is 63.08 kg/(m^2). General:  Patient is a morbidly obese well appearing female in no acute distress HEENT: PERRLA, sclerae anicteric no conjunctival pallor, MMM Neck: supple, no palpable adenopathy Lungs: clear to auscultation bilaterally, no wheezes, rhonchi, or rales Cardiovascular: regular rate rhythm, S1, S2, no murmurs, rubs or gallops Abdomen: Soft, non-tender, non-distended, normoactive bowel sounds, no HSM Extremities: warm and well perfused, no clubbing, cyanosis, or edema Skin: No rashes or lesions Neuro: Non-focal ECOG PERFORMANCE STATUS: 2 - Symptomatic, <50% confined to bed   LABORATORY DATA: Lab Results  Component Value Date   WBC 1.6* 12/02/2012   HGB 8.5* 12/02/2012   HCT 26.2* 12/02/2012   MCV 77.4* 12/02/2012   PLT 233 12/02/2012      Chemistry  Component Value Date/Time   NA 138 12/02/2012 0941   NA 135 10/31/2012 0655   K 3.3* 12/02/2012 0941   K 3.5 10/31/2012 0655   CL 97 10/31/2012 0655   CL 98 10/07/2012 0923   CO2 28 12/02/2012 0941   CO2 29 10/31/2012 0655   BUN 5.3* 12/02/2012 0941   BUN 8 10/31/2012 0655   CREATININE 0.7 12/02/2012 0941   CREATININE 0.78 10/31/2012 0655      Component Value Date/Time   CALCIUM 9.1 12/02/2012 0941   CALCIUM 8.3* 10/31/2012 0655   ALKPHOS 82 12/02/2012 0941   ALKPHOS 55 09/09/2012 1315   AST 18 12/02/2012 0941   AST 25 09/09/2012 1315   ALT 13 12/02/2012 0941   ALT 17 09/09/2012 1315   BILITOT 0.66 12/02/2012 0941   BILITOT 0.4 09/09/2012 1315       RADIOGRAPHIC STUDIES:  Ct Chest W Contrast  10/21/2012   *RADIOLOGY REPORT*  Clinical Data:  Recurrent endometrial cancer.  CT CHEST, ABDOMEN AND PELVIS WITH CONTRAST  Technique: Contiguous axial images of the chest abdomen and pelvis were obtained after IV contrast administration.  Contrast: 80 ml Omnipaque-300  Comparison: Abdominal pelvic CT of 02/11/2009.  No prior chest CT. Plain film chest of 12/23/2009 is reviewed.  CT CHEST  Findings: Lung windows demonstrate moderate degradation secondary to motion  and patient body habitus. No airspace opacities.  Soft tissue windows demonstrate no definite supraclavicular adenopathy.  Mild cardiomegaly, without pericardial or pleural effusion.  No gross mediastinal or hilar adenopathy.  Tiny hiatal hernia.  IMPRESSION:  1.  Moderate degradation secondary to motion and patient body habitus. 2.  Given this factor, no acute process or evidence of metastatic disease within the chest.  CT ABDOMEN AND PELVIS  Findings:  Moderate to marked degradation involving the abdomen. Nondiagnostic evaluation of the pelvis.  Possible hyperenhancing nodule in the hepatic dome 1.7 cm on image 34/series 2.  Grossly normal spleen, distal stomach.  Pancreas not well evaluated.  Gallbladder not evaluated.  Grossly normal adrenal glands and kidneys.  No gross retroperitoneal abdominal adenopathy.  A moderate amount of abdominal ascites.  No gross bowel obstruction.    Pelvic adenopathy cannot be evaluated.  Moderate volume pelvic free fluid.  No gross osseous abnormality.  IMPRESSION:  1.  Moderate to markedly limited evaluation of the abdomen secondary patient body habitus.  Nondiagnostic evaluation of the pelvis. 2.  Moderate volume abdominal pelvic ascites.  No gross retroperitoneal abdominal adenopathy. 3.  Possible hepatic dome nodule, indeterminate.  Not readily apparent on the prior of 2010.  As the patient is not a candidate for MRI, this could be reevaluated on follow-up CTs.   Original Report Authenticated By: Jeronimo Greaves, M.D.   Ct Abdomen Pelvis W Contrast  10/21/2012   *RADIOLOGY REPORT*  Clinical Data:  Recurrent endometrial cancer.  CT CHEST, ABDOMEN AND PELVIS WITH CONTRAST  Technique: Contiguous axial images of the chest abdomen and pelvis were obtained after IV contrast administration.  Contrast: 80 ml Omnipaque-300  Comparison: Abdominal pelvic CT of 02/11/2009.  No prior chest CT. Plain film chest of 12/23/2009 is reviewed.  CT CHEST  Findings: Lung windows demonstrate moderate  degradation secondary to motion and patient body habitus. No airspace opacities.  Soft tissue windows demonstrate no definite supraclavicular adenopathy.  Mild cardiomegaly, without pericardial or pleural effusion.  No gross mediastinal or hilar adenopathy.  Tiny hiatal hernia.  IMPRESSION:  1.  Moderate degradation secondary to motion and patient body habitus.  2.  Given this factor, no acute process or evidence of metastatic disease within the chest.  CT ABDOMEN AND PELVIS  Findings:  Moderate to marked degradation involving the abdomen. Nondiagnostic evaluation of the pelvis.  Possible hyperenhancing nodule in the hepatic dome 1.7 cm on image 34/series 2.  Grossly normal spleen, distal stomach.  Pancreas not well evaluated.  Gallbladder not evaluated.  Grossly normal adrenal glands and kidneys.  No gross retroperitoneal abdominal adenopathy.  A moderate amount of abdominal ascites.  No gross bowel obstruction.    Pelvic adenopathy cannot be evaluated.  Moderate volume pelvic free fluid.  No gross osseous abnormality.  IMPRESSION:  1.  Moderate to markedly limited evaluation of the abdomen secondary patient body habitus.  Nondiagnostic evaluation of the pelvis. 2.  Moderate volume abdominal pelvic ascites.  No gross retroperitoneal abdominal adenopathy. 3.  Possible hepatic dome nodule, indeterminate.  Not readily apparent on the prior of 2010.  As the patient is not a candidate for MRI, this could be reevaluated on follow-up CTs.   Original Report Authenticated By: Jeronimo Greaves, M.D.   US Paracentesis  10/28/2012   *RADIOLOGY REPORT*  Clinical Data: Recurrent uterine cancer, abdominal distension and discomfort secondary to ascites.  ULTRASOUND GUIDED PARACENTESIS  Comparison:  Previous paracentesis  An ultrasound guided paracentesis was thoroughly discussed with the patient and questions answered.  The benefits, risks, alternatives and complications were also discussed.  The patient understands and wishes to  proceed with the procedure.  Written consent was obtained.  Ultrasound was performed to localize and mark an adequate pocket of fluid in the left upper quadrant of the abdomen.  The area was then prepped and draped in the normal sterile fashion.  1% Lidocaine was used for local anesthesia.  Under ultrasound guidance a 19 gauge Yueh catheter was introduced.  Paracentesis was performed.  The catheter was removed and a dressing applied.  Complications:  None  Findings:  A total of approximately 10 liters of dark amber-colored fluid was removed.  A fluid sample was not sent for laboratory analysis.  IMPRESSION: Successful ultrasound guided paracentesis yielding 10 liters of ascites.  Read by Brayton El PA-C   Original Report Authenticated By: Richarda Overlie, M.D.   US Paracentesis  10/14/2012   *RADIOLOGY REPORT*  Clinical Data: Recurrent uterine cancer, abdominal distension and discomfort secondary to ascites.  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 Patient will be treated with Taxol and carboplatinum palliatively. Port a cath was placed in the OR, and patient will proceed with Taxol Carboplatin beginning 11/04/12.  She is currently cycle 2 day 8.    #2 Ascites: previously underwent paracenteses 1-2 times per week, she hasn't required one in over 1 week.    PLAN:   #1  Doing moderately well today.   She is neutropenic, and will start taking Cipro tonight. I reviewed the neutropenic instructions with the patient and family in detail.    #2 I prescribed K-dur daily for the hypokalemia. She will receive IV fluids for the dehydration and phenergan for the nausea.   #3 She will continue to take imodium as needed for the diarrhea.  They will call if it worsens, or doesn't decrease by Thursday.    #4 She is anemic, she has had iron deficiency in the past, I will re-check her iron levels today.    #5 She will  return in one week for labs and an  evaluation.    All questions were answered. The patient knows to call the clinic with any problems, questions or concerns. We can certainly see the patient much sooner if necessary.  I spent 25 minutes counseling the patient face to face. The total time spent in the appointment was 30 minutes.  Cherie Ouch Lyn Hollingshead, NP Medical Oncology [facility] Phone: [phone]

## 2012-12-02 NOTE — Patient Instructions (Signed)
Patient Neutropenia Instruction Sheet  Diagnosis: Breast Cancer      Treating Physician: Drue Second, MD  Treatment: 1. Type of chemotherapy: Taxol Carbo 2. Date of last treatment: 11/25/12  Last Blood Counts: Lab Results  Component Value Date   WBC 1.6* 12/02/2012   HGB 8.5* 12/02/2012   HCT 26.2* 12/02/2012   MCV 77.4* 12/02/2012   PLT 233 12/02/2012  ANC 500     Prophylactic Antibiotics: Cipro 500 mg by mouth twice a day Instructions: 1. Monitor temperature and call if fever  greater than 100.5, chills, shaking chills (rigors) 2. Call Physician on-call at [phone] 3. Give him/her symptoms and list of medications that you are taking and your last blood count.  Hypokalemia Hypokalemia means a low potassium level in the blood.Potassium is an electrolyte that helps regulate the amount of fluid in the body. It also stimulates muscle contraction and maintains a stable acid-base balance.Most of the body's potassium is inside of cells, and only a very small amount is in the blood. Because the amount in the blood is so small, minor changes can have big effects. PREPARATION FOR TEST Testing for potassium requires taking a blood sample taken by needle from a vein in the arm. The skin is cleaned thoroughly before the sample is drawn. There is no other special preparation needed. NORMAL VALUES Potassium levels below 3.5 mEq/L are abnormally low. Levels above 5.1 mEq/L are abnormally high. Ranges for normal findings may vary among different laboratories and hospitals. You should always check with your doctor after having lab work or other tests done to discuss the meaning of your test results and whether your values are considered within normal limits. MEANING OF TEST  Your caregiver will go over the test results with you and discuss the importance and meaning of your results, as well as treatment options and the need for additional tests, if necessary. A potassium level is frequently part  of a routine medical exam. It is usually included as part of a whole "panel" of tests for several blood salts (such as Sodium and Chloride). It may be done as part of follow-up when a low potassium level was found in the past or other blood salts are suspected of being out of balance. A low potassium level might be suspected if you have one or more of the following:  Symptoms of weakness.  Abnormal heart rhythms.  High blood pressure and are taking medication to control this, especially water pills (diuretics).  Kidney disease that can affect your potassium level .  Diabetes requiring the use of insulin. The potassium may fall after taking insulin, especially if the diabetes had been out of control for a while.  A condition requiring the use of cortisone-type medication or certain types of antibiotics.  Vomiting and/or diarrhea for more than a day or two.  A stomach or intestinal condition that may not permit appropriate absorption of potassium.  Fainting episodes.  Mental confusion. OBTAINING TEST RESULTS It is your responsibility to obtain your test results. Ask the lab or department performing the test when and how you will get your results.  Please contact your caregiver directly if you have not received the results within one week. At that time, ask if there is anything different or new you should be doing in relation to the results. TREATMENT Hypokalemia can be treated with potassium supplements taken by mouth and/or adjustments in your current medications. A diet high in potassium is also helpful. Foods with high potassium content  are:  Peas, lentils, lima beans, nuts, and dried fruit.  Whole grain and bran cereals and breads.  Fresh fruit, vegetables (bananas, cantaloupe, grapefruit, oranges, tomatoes, honeydew melons, potatoes).  Orange and tomato juices.  Meats. If potassium supplement has been prescribed for you today or your medications have been adjusted, see your  personal caregiver in time02 for a re-check. SEEK MEDICAL CARE IF:  There is a feeling of worsening weakness.  You experience repeated chest palpitations.  You are diabetic and having difficulty keeping your blood sugars in the normal range.  You are experiencing vomiting and/or diarrhea.  You are having difficulty with any of your regular medications. SEEK IMMEDIATE MEDICAL CARE IF:  You experience chest pain, shortness of breath, or episodes of dizziness.  You have been having vomiting or diarrhea for more than 2 days.  You have a fainting episode. MAKE SURE YOU:   Understand these instructions.  Will watch your condition.  Will get help right away if you are not doing well or get worse. Document Released: 04/09/2005 Document Revised: 07/02/2011 Document Reviewed: 03/20/2008 [facility] Patient Information 2014 [place], [place].   Promethazine tablets What is this medicine? PROMETHAZINE (proe METH a zeen) is an antihistamine. It is used to treat allergic reactions and to treat or prevent nausea and vomiting from illness or motion sickness. It is also used to make you sleep before surgery, and to help treat pain or nausea after surgery. This medicine may be used for other purposes; ask your health care provider or pharmacist if you have questions. What should I tell my health care provider before I take this medicine? They need to know if you have any of these conditions: -glaucoma -high blood pressure or heart disease -kidney disease -liver disease -lung or breathing disease, like asthma -prostate trouble -pain or difficulty passing urine -seizures -an unusual or allergic reaction to promethazine or phenothiazines, other medicines, foods, dyes, or preservatives -pregnant or trying to get pregnant -breast-feeding How should I use this medicine? Take this medicine by mouth with a glass of water. Follow the directions on the prescription label. Take your doses at regular  intervals. Do not take your medicine more often than directed. Talk to your pediatrician regarding the use of this medicine in children. Special care may be needed. This medicine should not be given to infants and children younger than 30 years old. Overdosage: If you think you have taken too much of this medicine contact a poison control center or emergency room at once. NOTE: This medicine is only for you. Do not share this medicine with others. What if I miss a dose? If you miss a dose, take it as soon as you can. If it is almost time for your next dose, take only that dose. Do not take double or extra doses. What may interact with this medicine? Do not take this medicine with any of the following medications: -medicines called MAO Inhibitors like Nardil, Parnate, Marplan, Eldepryl -other phenothiazines like trimethobenzamide This medicine may also interact with the following medications: -barbiturates like phenobarbital -bromocriptine -certain antidepressants -certain antihistamines used in allergy or cold medicines -epinephrine -levodopa -medicines for sleep -medicines for mental problems and psychotic disturbances -medicines for movement abnormalities as in Parkinson's disease, or for gastrointestinal problems -muscle relaxants -prescription pain medicines This list may not describe all possible interactions. Give your health care provider a list of all the medicines, herbs, non-prescription drugs, or dietary supplements you use. Also tell them if you smoke, drink alcohol, or  use illegal drugs. Some items may interact with your medicine. What should I watch for while using this medicine? Tell your doctor or health care professional if your symptoms do not start to get better in 1 to 2 days. You may get drowsy or dizzy. Do not drive, use machinery, or do anything that needs mental alertness until you know how this medicine affects you. To reduce the risk of dizzy or fainting spells, do  not stand or sit up quickly, especially if you are an older patient. Alcohol may increase dizziness and drowsiness. Avoid alcoholic drinks. Your mouth may get dry. Chewing sugarless gum or sucking hard candy, and drinking plenty of water may help. Contact your doctor if the problem does not go away or is severe. This medicine may cause dry eyes and blurred vision. If you wear contact lenses you may feel some discomfort. Lubricating drops may help. See your eye doctor if the problem does not go away or is severe. This medicine can make you more sensitive to the sun. Keep out of the sun. If you cannot avoid being in the sun, wear protective clothing and use sunscreen. Do not use sun lamps or tanning beds/booths. If you are diabetic, check your blood-sugar levels regularly. What side effects may I notice from receiving this medicine? Side effects that you should report to your doctor or health care professional as soon as possible: -blurred vision -irregular heartbeat, palpitations or chest pain -muscle or facial twitches -pain or difficulty passing urine -seizures -skin rash -slowed or shallow breathing -unusual bleeding or bruising -yellowing of the eyes or skin Side effects that usually do not require medical attention (report to your doctor or health care professional if they continue or are bothersome): -headache -nightmares, agitation, nervousness, excitability, not able to sleep (these are more likely in children) -stuffy nose This list may not describe all possible side effects. Call your doctor for medical advice about side effects. You may report side effects to FDA at 1-800-FDA-1088. Where should I keep my medicine? Keep out of the reach of children. Store at room temperature, between 20 and 25 degrees C (68 and 77 degrees F). Protect from light. Throw away any unused medicine after the expiration date. NOTE: This sheet is a summary. It may not cover all possible information. If you  have questions about this medicine, talk to your doctor, pharmacist, or health care provider.  2013, [employer]. (11/06/2007 12:05:26 PM)

## 2012-12-02 NOTE — Patient Instructions (Addendum)
Dehydration, Adult  Dehydration means your body does not have as much fluid as it needs. Your kidneys, brain, and heart will not work properly without the right amount of fluids and salt.   HOME CARE   Ask your doctor how to replace body fluid losses (rehydrate).   Drink enough fluids to keep your pee (urine) clear or pale yellow.   Drink small amounts of fluids often if you feel sick to your stomach (nauseous) or throw up (vomit).   Eat like you normally do.   Avoid:   Foods or drinks high in sugar.   Bubbly (carbonated) drinks.   Juice.   Very hot or cold fluids.   Drinks with caffeine.   Fatty, greasy foods.   Alcohol.   Tobacco.   Eating too much.   Gelatin desserts.   Wash your hands to avoid spreading germs (bacteria, viruses).   Only take medicine as told by your doctor.   Keep all doctor visits as told.  GET HELP RIGHT AWAY IF:    You cannot drink something without throwing up.   You get worse even with treatment.   Your vomit has blood in it or looks greenish.   Your poop (stool) has blood in it or looks black and tarry.   You have not peed in 6 to 8 hours.   You pee a small amount of very dark pee.   You have a fever.   You pass out (faint).   You have belly (abdominal) pain that gets worse or stays in one spot (localizes).   You have a rash, stiff neck, or bad headache.   You get easily annoyed, sleepy, or are hard to wake up.   You feel weak, dizzy, or very thirsty.  MAKE SURE YOU:    Understand these instructions.   Will watch your condition.   Will get help right away if you are not doing well or get worse.  Document Released: 02/03/2009 Document Revised: 07/02/2011 Document Reviewed: 11/27/2010  [employer] Patient Information 2014 [employer].

## 2012-12-02 NOTE — Progress Notes (Signed)
1245--Accessed right chest PAC without difficulty. Small amount of blood return noted, PAC is positional. Unable to draw enough blood for labs at this time. Will attempt to draw at end of infusion of IVF's. 1420--Patient PAC still very positional,unable to obtain more than 2ml of blood. Will draw labs peripherally.

## 2012-12-02 NOTE — Telephone Encounter (Signed)
Pt seen 08/12 by NP. Meds refilled

## 2012-12-03 ENCOUNTER — Telehealth: Payer: Self-pay | Admitting: *Deleted

## 2012-12-03 LAB — IRON AND TIBC CHCC
%SAT: 14 % — ABNORMAL LOW (ref 21–57)
TIBC: 161 ug/dL — ABNORMAL LOW (ref 236–444)
UIBC: 139 ug/dL (ref 120–384)

## 2012-12-03 LAB — FERRITIN CHCC: Ferritin: 796 ng/ml — ABNORMAL HIGH (ref 9–269)

## 2012-12-03 NOTE — Telephone Encounter (Signed)
Notified pt per MD, begin K-DUR po x 5days. Pt advised Mardella Layman, NP gave her rx for K+. No further concerns

## 2012-12-03 NOTE — Telephone Encounter (Signed)
Message copied by GARNER, Gerald Leitz on Wed Dec 03, 2012 11:04 AM ------      Message from: Victorino December      Created: Tue Dec 02, 2012  9:27 PM       Call patient:begin K-dur 20 meq po daily x5 ------

## 2012-12-09 ENCOUNTER — Other Ambulatory Visit (HOSPITAL_BASED_OUTPATIENT_CLINIC_OR_DEPARTMENT_OTHER): Payer: BC Managed Care – PPO | Admitting: Lab

## 2012-12-09 ENCOUNTER — Encounter: Payer: Self-pay | Admitting: Adult Health

## 2012-12-09 ENCOUNTER — Ambulatory Visit (HOSPITAL_BASED_OUTPATIENT_CLINIC_OR_DEPARTMENT_OTHER): Payer: BC Managed Care – PPO | Admitting: Adult Health

## 2012-12-09 ENCOUNTER — Telehealth: Payer: Self-pay | Admitting: Oncology

## 2012-12-09 VITALS — BP 95/64 | HR 84 | Temp 97.6°F | Resp 20 | Ht 71.0 in | Wt >= 6400 oz

## 2012-12-09 DIAGNOSIS — C549 Malignant neoplasm of corpus uteri, unspecified: Secondary | ICD-10-CM

## 2012-12-09 DIAGNOSIS — R188 Other ascites: Secondary | ICD-10-CM

## 2012-12-09 DIAGNOSIS — C786 Secondary malignant neoplasm of retroperitoneum and peritoneum: Secondary | ICD-10-CM

## 2012-12-09 DIAGNOSIS — C541 Malignant neoplasm of endometrium: Secondary | ICD-10-CM

## 2012-12-09 LAB — COMPREHENSIVE METABOLIC PANEL (CC13)
Alkaline Phosphatase: 74 U/L (ref 40–150)
BUN: 9.9 mg/dL (ref 7.0–26.0)
Glucose: 80 mg/dl (ref 70–140)
Total Bilirubin: 0.39 mg/dL (ref 0.20–1.20)

## 2012-12-09 LAB — CBC WITH DIFFERENTIAL/PLATELET
BASO%: 0.6 % (ref 0.0–2.0)
Basophils Absolute: 0.1 10*3/uL (ref 0.0–0.1)
EOS%: 0 % (ref 0.0–7.0)
HGB: 8.9 g/dL — ABNORMAL LOW (ref 11.6–15.9)
MCH: 25.7 pg (ref 25.1–34.0)
MCHC: 32 g/dL (ref 31.5–36.0)
MCV: 80.4 fL (ref 79.5–101.0)
MONO%: 5.5 % (ref 0.0–14.0)
RBC: 3.45 10*6/uL — ABNORMAL LOW (ref 3.70–5.45)
RDW: 20.6 % — ABNORMAL HIGH (ref 11.2–14.5)
lymph#: 1.6 10*3/uL (ref 0.9–3.3)

## 2012-12-09 NOTE — Progress Notes (Signed)
OFFICE PROGRESS NOTE  CC  [name], MD [address] [place] [address] Dr. Laurette Schimke  DIAGNOSIS: 47 year old female with prior history of stage I a uterine cancer diagnosed in 2010. Now with recurrent disease diagnosed in May 2014   PRIOR THERAPY:  #1Patient subsequently developed excessive uterine bleeding. Bad tablet to dilatation and curettage with hysteroscopy. 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 Due to morbid obesity Mirena IUD was placed along with the addition of Aygestin. Patient had a dramatic directed weight loss with nutritional support she went from 523 pounds to 359 pounds in July of 2011. Patient continued to do well and is 12/27/2009 she at that time underwent a robotic-assisted laparoscopic hysterectomy, bilateral salpingo-oophorectomy, and mini laparotomy through the umbilical port with morcellation of uterus within about the delivery of the uterus. The final pathology did reveal an endometrial adenocarcinoma grade 2 with invasion limited to 1 mm of the myometrium  #2continued to do well until June 2014 when she developed right upper quadrant pain. It was presumed to be cholelithiasis. 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 Omental biopsies were obtained. The pathology was consistent with metastatic adenocarcinoma in the presumption of recurrent endometrial carcinoma was performed made. She was seen by Dr. Laurette Schimke on 10/07/2012. She was recommended that she undergo chemotherapy consisting of Taxol and carboplatinum  #3 abdominal ascites: Multiple paracenteses  CURRENT THERAPY: Taxol/Carboplatinum cycle 2 day 15  INTERVAL HISTORY: [name] 47 y.o. female returns for followup visit today of her endometrial cancer. She is doing well today.  Her nausea is much improved.   She received IV fluids last week for her nausea/dehydration and feels very much improved.  She feels the ascites building up in her abdomen and would like to schedule a paracentesis for Monday, August 25.  Otherwise, a 10 point ROS is neg.   MEDICAL HISTORY: Past Medical History  Diagnosis Date  . Uterine cancer   . Obesity   . DVT (deep venous thrombosis) 2009  . PONV (postoperative nausea and vomiting)   . Swelling     BOTH LEGS  . Rash     LOWER ABD  . Gallstones   . Ascites     ALLERGIES:  has No Known Allergies.  MEDICATIONS:  Current Outpatient Prescriptions  Medication Sig Dispense Refill  . Ascorbic Acid (VITAMIN C) 1000 MG tablet Take 1,000 mg by mouth daily.      . ciprofloxacin (CIPRO) 500 MG tablet Take 1 tablet (500 mg total) by mouth 2 (two) times daily.  14 tablet  0  . Cyanocobalamin (VITAMIN B-12 PO) Take 2,500 mg by mouth daily.       Marland Kitchen dexamethasone (DECADRON) 4 MG tablet Take 2 tablets (8 mg total) by mouth 2 (two) times daily with a meal. Take two times a day starting the day after chemotherapy for 3 days.  30 tablet  1  . ferrous sulfate 325 (65 FE) MG EC tablet Take 1 tablet (325 mg total) by mouth 3 (three) times daily with meals.  90 tablet  0  . folic acid (FOLVITE) 400 MCG tablet Take 400 mcg by mouth daily.        Marland Kitchen lidocaine-prilocaine (EMLA) cream Apply topically as needed. Apply to port-a-cath site 1-2 hours prior to treatment.  30 g  2  . loratadine (CLARITIN) 10  MG tablet Take 10 mg by mouth daily.      Marland Kitchen LORazepam (ATIVAN) 0.5 MG tablet Take 1 tablet (0.5 mg total) by mouth every 6 (six) hours as needed (Nausea or vomiting).  30 tablet  0  . magnesium gluconate (MAGONATE) 500 MG tablet Take 500 mg by mouth daily.      . Multiple Vitamins-Minerals (MULTIVITAMIN WITH MINERALS) tablet Take 1 tablet by mouth daily.       . naproxen sodium (ANAPROX) 220 MG tablet Take 880 mg by mouth every morning.       . ondansetron (ZOFRAN) 8 MG tablet Take 1 tablet  (8 mg total) by mouth 2 (two) times daily. Take two times a day starting the day after chemo for 3 days. Then take two times a day as needed for nausea or vomiting.  30 tablet  1  . oxyCODONE-acetaminophen (PERCOCET) 10-325 MG per tablet Take 1 tablet by mouth every 4 (four) hours as needed for pain.  90 tablet  0  . pegfilgrastim (NEULASTA) 6 MG/0.6ML injection Inject 6 mg into the skin every 21 ( twenty-one) days.      . potassium chloride (K-DUR) 10 MEQ tablet Take 1 tablet (10 mEq total) by mouth 2 (two) times daily.  14 tablet  0  . PRESCRIPTION MEDICATION every 21 ( twenty-one) days. CHEMOTHERAPY REGIMEN      . prochlorperazine (COMPAZINE) 10 MG tablet Take 1 tablet (10 mg total) by mouth every 6 (six) hours as needed (Nausea or vomiting).  30 tablet  1  . promethazine (PHENERGAN) 12.5 MG tablet Take 1 tablet (12.5 mg total) by mouth every 6 (six) hours as needed for nausea.  30 tablet  0  . triamterene-hydrochlorothiazide (MAXZIDE-25) 37.5-25 MG per tablet Take 1 tablet by mouth every morning.        No current facility-administered medications for this visit.    SURGICAL HISTORY:  Past Surgical History  Procedure Laterality Date  . Ankle surgery  1950  . Wisdom tooth extraction    . Tonsillectomy and adenoidectomy    . Hysteroscopy w/d&c    . Abdominal hysterectomy  12/2009    [facility], [facility]  . Laparoscopy N/A 10/02/2012    Procedure: LAPAROSCOPY DIAGNOSTIC, perocentisis, omental biopsy;  Surgeon: Wilmon Arms. Tsuei, MD;  Location: [facility];  Service: General;  Laterality: N/A;  removed a total of 15,000 of acities  . Paracentesis      REVIEW OF SYSTEMS:  Pertinent items are noted in HPI.   HEALTH MAINTENANCE:  PHYSICAL EXAMINATION: Blood pressure 95/64, pulse 84, temperature 97.6 F (36.4 C), temperature source Oral, resp. rate 20, height 5\' 11"  (1.803 m), weight 460 lb 6.4 oz (208.836 kg). Body mass index is 64.24 kg/(m^2). General: Patient is a morbidly obese well appearing female in  no acute distress HEENT: PERRLA, sclerae anicteric no conjunctival pallor, MMM Neck: supple, no palpable adenopathy Lungs: clear to auscultation bilaterally, no wheezes, rhonchi, or rales Cardiovascular: regular rate rhythm, S1, S2, no murmurs, rubs or gallops Abdomen: Soft, non-tender, non-distended, normoactive bowel sounds, no HSM--limited exam due to morbid obesity Extremities: warm and well perfused, no clubbing, cyanosis, or edema Skin: No rashes or lesions Neuro: Non-focal ECOG PERFORMANCE STATUS: 2 - Symptomatic, <50% confined to bed   LABORATORY DATA: Lab Results  Component Value Date   WBC 8.7 12/09/2012   HGB 8.9* 12/09/2012   HCT 27.7* 12/09/2012   MCV 80.4 12/09/2012   PLT 279 12/09/2012      Chemistry  Component Value Date/Time   NA 144 12/09/2012 0916   NA 135 10/31/2012 0655   K 3.5 12/09/2012 0916   K 3.5 10/31/2012 0655   CL 97 10/31/2012 0655   CL 98 10/07/2012 0923   CO2 26 12/09/2012 0916   CO2 29 10/31/2012 0655   BUN 9.9 12/09/2012 0916   BUN 8 10/31/2012 0655   CREATININE 0.7 12/09/2012 0916   CREATININE 0.78 10/31/2012 0655      Component Value Date/Time   CALCIUM 9.4 12/09/2012 0916   CALCIUM 8.3* 10/31/2012 0655   ALKPHOS 74 12/09/2012 0916   ALKPHOS 55 09/09/2012 1315   AST 14 12/09/2012 0916   AST 25 09/09/2012 1315   ALT 15 12/09/2012 0916   ALT 17 09/09/2012 1315   BILITOT 0.39 12/09/2012 0916   BILITOT 0.4 09/09/2012 1315       RADIOGRAPHIC STUDIES:  Ct Chest W Contrast  10/21/2012   *RADIOLOGY REPORT*  Clinical Data:  Recurrent endometrial cancer.  CT CHEST, ABDOMEN AND PELVIS WITH CONTRAST  Technique: Contiguous axial images of the chest abdomen and pelvis were obtained after IV contrast administration.  Contrast: 80 ml Omnipaque-300  Comparison: Abdominal pelvic CT of 02/11/2009.  No prior chest CT. Plain film chest of 12/23/2009 is reviewed.  CT CHEST  Findings: Lung windows demonstrate moderate degradation secondary to motion and patient body  habitus. No airspace opacities.  Soft tissue windows demonstrate no definite supraclavicular adenopathy.  Mild cardiomegaly, without pericardial or pleural effusion.  No gross mediastinal or hilar adenopathy.  Tiny hiatal hernia.  IMPRESSION:  1.  Moderate degradation secondary to motion and patient body habitus. 2.  Given this factor, no acute process or evidence of metastatic disease within the chest.  CT ABDOMEN AND PELVIS  Findings:  Moderate to marked degradation involving the abdomen. Nondiagnostic evaluation of the pelvis.  Possible hyperenhancing nodule in the hepatic dome 1.7 cm on image 34/series 2.  Grossly normal spleen, distal stomach.  Pancreas not well evaluated.  Gallbladder not evaluated.  Grossly normal adrenal glands and kidneys.  No gross retroperitoneal abdominal adenopathy.  A moderate amount of abdominal ascites.  No gross bowel obstruction.    Pelvic adenopathy cannot be evaluated.  Moderate volume pelvic free fluid.  No gross osseous abnormality.  IMPRESSION:  1.  Moderate to markedly limited evaluation of the abdomen secondary patient body habitus.  Nondiagnostic evaluation of the pelvis. 2.  Moderate volume abdominal pelvic ascites.  No gross retroperitoneal abdominal adenopathy. 3.  Possible hepatic dome nodule, indeterminate.  Not readily apparent on the prior of 2010.  As the patient is not a candidate for MRI, this could be reevaluated on follow-up CTs.   Original Report Authenticated By: Jeronimo Greaves, M.D.   Ct Abdomen Pelvis W Contrast  10/21/2012   *RADIOLOGY REPORT*  Clinical Data:  Recurrent endometrial cancer.  CT CHEST, ABDOMEN AND PELVIS WITH CONTRAST  Technique: Contiguous axial images of the chest abdomen and pelvis were obtained after IV contrast administration.  Contrast: 80 ml Omnipaque-300  Comparison: Abdominal pelvic CT of 02/11/2009.  No prior chest CT. Plain film chest of 12/23/2009 is reviewed.  CT CHEST  Findings: Lung windows demonstrate moderate degradation  secondary to motion and patient body habitus. No airspace opacities.  Soft tissue windows demonstrate no definite supraclavicular adenopathy.  Mild cardiomegaly, without pericardial or pleural effusion.  No gross mediastinal or hilar adenopathy.  Tiny hiatal hernia.  IMPRESSION:  1.  Moderate degradation secondary to motion and patient body habitus.  2.  Given this factor, no acute process or evidence of metastatic disease within the chest.  CT ABDOMEN AND PELVIS  Findings:  Moderate to marked degradation involving the abdomen. Nondiagnostic evaluation of the pelvis.  Possible hyperenhancing nodule in the hepatic dome 1.7 cm on image 34/series 2.  Grossly normal spleen, distal stomach.  Pancreas not well evaluated.  Gallbladder not evaluated.  Grossly normal adrenal glands and kidneys.  No gross retroperitoneal abdominal adenopathy.  A moderate amount of abdominal ascites.  No gross bowel obstruction.    Pelvic adenopathy cannot be evaluated.  Moderate volume pelvic free fluid.  No gross osseous abnormality.  IMPRESSION:  1.  Moderate to markedly limited evaluation of the abdomen secondary patient body habitus.  Nondiagnostic evaluation of the pelvis. 2.  Moderate volume abdominal pelvic ascites.  No gross retroperitoneal abdominal adenopathy. 3.  Possible hepatic dome nodule, indeterminate.  Not readily apparent on the prior of 2010.  As the patient is not a candidate for MRI, this could be reevaluated on follow-up CTs.   Original Report Authenticated By: Jeronimo Greaves, M.D.   US Paracentesis  10/28/2012   *RADIOLOGY REPORT*  Clinical Data: Recurrent uterine cancer, abdominal distension and discomfort secondary to ascites.  ULTRASOUND GUIDED PARACENTESIS  Comparison:  Previous paracentesis  An ultrasound guided paracentesis was thoroughly discussed with the patient and questions answered.  The benefits, risks, alternatives and complications were also discussed.  The patient understands and wishes to proceed with the  procedure.  Written consent was obtained.  Ultrasound was performed to localize and mark an adequate pocket of fluid in the left upper quadrant of the abdomen.  The area was then prepped and draped in the normal sterile fashion.  1% Lidocaine was used for local anesthesia.  Under ultrasound guidance a 19 gauge Yueh catheter was introduced.  Paracentesis was performed.  The catheter was removed and a dressing applied.  Complications:  None  Findings:  A total of approximately 10 liters of dark amber-colored fluid was removed.  A fluid sample was not sent for laboratory analysis.  IMPRESSION: Successful ultrasound guided paracentesis yielding 10 liters of ascites.  Read by Brayton El PA-C   Original Report Authenticated By: Richarda Overlie, M.D.   US Paracentesis  10/14/2012   *RADIOLOGY REPORT*  Clinical Data: Recurrent uterine cancer, abdominal distension and discomfort secondary to ascites.  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 Patient will be treated with Taxol and carboplatinum palliatively. Port a cath was placed in the OR, and patient will proceed with Taxol Carboplatin beginning 11/04/12.  She is currently cycle 2 day 8.    #2 Ascites: previously underwent paracenteses 1-2 times per week, she hasn't required one in over 1 week.    PLAN:   #1  Doing well today.  Her labs have recovered.  I requested scheduling of the paracentesis Monday.  She is much improved since receiving IV fluids.      #2 Her potassium is 3.5.  She will eat a diet rich in potassium this week as she has completed her daily potassium supplementation and we will monitor.    #3 She will return in one week for labs, an evaluation, and chemotherapy.    All questions were answered. The patient knows to call the clinic with any problems, questions or concerns. We can certainly see the patient much sooner if necessary.  I spent 25  minutes counseling the patient face  to face. The total time spent in the appointment was 30 minutes.  Catherine Ouch Lyn Hollingshead, NP Medical Oncology [facility] Phone: [phone]

## 2012-12-09 NOTE — Patient Instructions (Signed)
Doing well.  Labs are stable.  You can schedule a paracentesis for Monday.  Please call us if you have any questions or concerns.

## 2012-12-10 ENCOUNTER — Ambulatory Visit: Payer: BC Managed Care – PPO

## 2012-12-15 ENCOUNTER — Other Ambulatory Visit: Payer: Self-pay | Admitting: Adult Health

## 2012-12-15 ENCOUNTER — Ambulatory Visit (HOSPITAL_COMMUNITY)
Admission: RE | Admit: 2012-12-15 | Discharge: 2012-12-15 | Disposition: A | Discharge status: Home / Self Care | Payer: BC Managed Care – PPO | Source: Ambulatory Visit | Attending: Adult Health | Admitting: Adult Health

## 2012-12-15 DIAGNOSIS — R188 Other ascites: Secondary | ICD-10-CM | POA: Insufficient documentation

## 2012-12-15 NOTE — Progress Notes (Signed)
Patient ID: [name], female   DOB: [birth year], 47 y.o.   MRN: [id number] Pt presented to [place] dept today for therapeutic paracentesis. On limited US of abd in all four quadrants there is minimal ascites noted. Procedure was cancelled and pt notified.

## 2012-12-16 ENCOUNTER — Ambulatory Visit (HOSPITAL_BASED_OUTPATIENT_CLINIC_OR_DEPARTMENT_OTHER): Payer: BC Managed Care – PPO

## 2012-12-16 ENCOUNTER — Other Ambulatory Visit (HOSPITAL_BASED_OUTPATIENT_CLINIC_OR_DEPARTMENT_OTHER): Payer: BC Managed Care – PPO | Admitting: Lab

## 2012-12-16 ENCOUNTER — Ambulatory Visit (HOSPITAL_BASED_OUTPATIENT_CLINIC_OR_DEPARTMENT_OTHER): Payer: BC Managed Care – PPO | Admitting: Adult Health

## 2012-12-16 ENCOUNTER — Encounter: Payer: Self-pay | Admitting: Adult Health

## 2012-12-16 VITALS — BP 125/79 | HR 69 | Temp 97.8°F | Resp 20 | Ht 71.0 in | Wt >= 6400 oz

## 2012-12-16 DIAGNOSIS — R188 Other ascites: Secondary | ICD-10-CM

## 2012-12-16 DIAGNOSIS — C541 Malignant neoplasm of endometrium: Secondary | ICD-10-CM

## 2012-12-16 DIAGNOSIS — Z5111 Encounter for antineoplastic chemotherapy: Secondary | ICD-10-CM

## 2012-12-16 DIAGNOSIS — C549 Malignant neoplasm of corpus uteri, unspecified: Secondary | ICD-10-CM

## 2012-12-16 DIAGNOSIS — C786 Secondary malignant neoplasm of retroperitoneum and peritoneum: Secondary | ICD-10-CM

## 2012-12-16 LAB — CBC WITH DIFFERENTIAL/PLATELET
Basophils Absolute: 0 10*3/uL (ref 0.0–0.1)
Eosinophils Absolute: 0 10*3/uL (ref 0.0–0.5)
HCT: 32.4 % — ABNORMAL LOW (ref 34.8–46.6)
HGB: 10.2 g/dL — ABNORMAL LOW (ref 11.6–15.9)
LYMPH%: 9.1 % — ABNORMAL LOW (ref 14.0–49.7)
MCV: 82.9 fL (ref 79.5–101.0)
MONO%: 6.3 % (ref 0.0–14.0)
NEUT#: 10.3 10*3/uL — ABNORMAL HIGH (ref 1.5–6.5)
NEUT%: 84.4 % — ABNORMAL HIGH (ref 38.4–76.8)
Platelets: 324 10*3/uL (ref 145–400)

## 2012-12-16 LAB — COMPREHENSIVE METABOLIC PANEL (CC13)
Albumin: 3.1 g/dL — ABNORMAL LOW (ref 3.5–5.0)
Alkaline Phosphatase: 63 U/L (ref 40–150)
BUN: 24.3 mg/dL (ref 7.0–26.0)
Glucose: 106 mg/dl (ref 70–140)
Potassium: 3.8 mEq/L (ref 3.5–5.1)
Total Bilirubin: 0.61 mg/dL (ref 0.20–1.20)

## 2012-12-16 MED ORDER — SODIUM CHLORIDE 0.9 % IJ SOLN
10.0000 mL | INTRAMUSCULAR | Status: DC | PRN
  Administered 2012-12-16: 10 mL
  Filled 2012-12-16: qty 10

## 2012-12-16 MED ORDER — LORAZEPAM 0.5 MG PO TABS: 0.5000 mg | ORAL_TABLET | Freq: Four times a day (QID) | ORAL | Status: DC | PRN

## 2012-12-16 MED ORDER — DIPHENHYDRAMINE HCL 50 MG/ML IJ SOLN
50.0000 mg | Freq: Once | INTRAMUSCULAR | Status: AC
  Administered 2012-12-16: 50 mg via INTRAVENOUS

## 2012-12-16 MED ORDER — DEXAMETHASONE SODIUM PHOSPHATE 20 MG/5ML IJ SOLN
12.0000 mg | Freq: Once | INTRAMUSCULAR | Status: AC
  Administered 2012-12-16: 12 mg via INTRAVENOUS

## 2012-12-16 MED ORDER — HEPARIN SOD (PORK) LOCK FLUSH 100 UNIT/ML IV SOLN
500.0000 [IU] | Freq: Once | INTRAVENOUS | Status: AC | PRN
  Administered 2012-12-16: 500 [IU]
  Filled 2012-12-16: qty 5

## 2012-12-16 MED ORDER — SODIUM CHLORIDE 0.9 % IV SOLN
900.0000 mg | Freq: Once | INTRAVENOUS | Status: AC
  Administered 2012-12-16: 900 mg via INTRAVENOUS
  Filled 2012-12-16: qty 90

## 2012-12-16 MED ORDER — FAMOTIDINE IN NACL 20-0.9 MG/50ML-% IV SOLN
20.0000 mg | Freq: Once | INTRAVENOUS | Status: AC
  Administered 2012-12-16: 20 mg via INTRAVENOUS

## 2012-12-16 MED ORDER — PACLITAXEL CHEMO INJECTION 300 MG/50ML
175.0000 mg/m2 | Freq: Once | INTRAVENOUS | Status: AC
  Administered 2012-12-16: 618 mg via INTRAVENOUS
  Filled 2012-12-16: qty 103

## 2012-12-16 MED ORDER — SODIUM CHLORIDE 0.9 % IV SOLN
Freq: Once | INTRAVENOUS | Status: AC
  Administered 2012-12-16: 11:00:00 via INTRAVENOUS

## 2012-12-16 MED ORDER — SODIUM CHLORIDE 0.9 % IV SOLN
150.0000 mg | Freq: Once | INTRAVENOUS | Status: AC
  Administered 2012-12-16: 150 mg via INTRAVENOUS
  Filled 2012-12-16: qty 5

## 2012-12-16 MED ORDER — PALONOSETRON HCL INJECTION 0.25 MG/5ML
0.2500 mg | Freq: Once | INTRAVENOUS | Status: AC
  Administered 2012-12-16: 0.25 mg via INTRAVENOUS

## 2012-12-16 NOTE — Patient Instructions (Addendum)
Nausea medication:  Decadron/Dexamethasone: take two tablets twice a day for three days starting the day after chemotherapy.    Ondansetron/Zofran: take twice a day as needed starting the third day after chemotherapy  Prochlorperazine/Compazine: Take every 6 hours as needed for nausea  Lorazepam/Ativan: Take every 6 hours as needed for nausea  Prochlorperazine/Compazine suppository: Use daily as needed for vomiting if you cannot keep your oral medication down.   Phenergan/Promethazine: 1 tablet every 6 hours as needed for nausea/vomiting.   Use caution with Phenergan/Compazine/Ativan as they can make you sleepy.

## 2012-12-16 NOTE — Progress Notes (Signed)
OFFICE PROGRESS NOTE  CC  [name], MD [address] [place] [address] Dr. Laurette Schimke  DIAGNOSIS: 47 year old female with prior history of stage I a uterine cancer diagnosed in 2010. Now with recurrent disease diagnosed in May 2014   PRIOR THERAPY:  #1Patient subsequently developed excessive uterine bleeding. Bad tablet to dilatation and curettage with hysteroscopy. 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 Due to morbid obesity Mirena IUD was placed along with the addition of Aygestin. Patient had a dramatic directed weight loss with nutritional support she went from 523 pounds to 359 pounds in July of 2011. Patient continued to do well and is 12/27/2009 she at that time underwent a robotic-assisted laparoscopic hysterectomy, bilateral salpingo-oophorectomy, and mini laparotomy through the umbilical port with morcellation of uterus within about the delivery of the uterus. The final pathology did reveal an endometrial adenocarcinoma grade 2 with invasion limited to 1 mm of the myometrium  #2continued to do well until June 2014 when she developed right upper quadrant pain. It was presumed to be cholelithiasis. 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 Omental biopsies were obtained. The pathology was consistent with metastatic adenocarcinoma in the presumption of recurrent endometrial carcinoma was performed made. She was seen by Dr. Laurette Schimke on 10/07/2012. She was recommended that she undergo chemotherapy consisting of Taxol and carboplatinum and started this on 11/04/12.  #3 abdominal ascites: Multiple paracenteses  CURRENT THERAPY: Taxol/Carboplatinum cycle 3 day 1  INTERVAL HISTORY: [name] 47 y.o. female returns for followup visit today of her endometrial cancer. She is doing well today.   She was scheduled for an ultrasound guided paracentesis yesterday, however they could not locate a pocket of ascitic fluid to tap.  She denies fevers, chills, nausea, vomiting, diarrhea, numbness.  They would like more information on her after chemotherapy nausea regimen.  She is constipated, and hasn't taken in stool softeners in anticipation for the chemotherapy to cause frequent bowel movements as in the past.  Otherwise, a 10 point ROS is neg.   MEDICAL HISTORY: Past Medical History  Diagnosis Date  . Uterine cancer   . Obesity   . DVT (deep venous thrombosis) 2009  . PONV (postoperative nausea and vomiting)   . Swelling     BOTH LEGS  . Rash     LOWER ABD  . Gallstones   . Ascites     ALLERGIES:  has No Known Allergies.  MEDICATIONS:  Current Outpatient Prescriptions  Medication Sig Dispense Refill  . Ascorbic Acid (VITAMIN C) 1000 MG tablet Take 1,000 mg by mouth daily.      . ciprofloxacin (CIPRO) 500 MG tablet Take 1 tablet (500 mg total) by mouth 2 (two) times daily.  14 tablet  0  . Cyanocobalamin (VITAMIN B-12 PO) Take 2,500 mg by mouth daily.       Marland Kitchen dexamethasone (DECADRON) 4 MG tablet Take 2 tablets (8 mg total) by mouth 2 (two) times daily with a meal. Take two times a day starting the day after chemotherapy for 3 days.  30 tablet  1  . ferrous sulfate 325 (65 FE) MG EC tablet Take 1 tablet (325 mg total) by mouth 3 (three) times daily with meals.  90 tablet  0  . folic acid (FOLVITE) 400 MCG tablet Take 400 mcg by mouth daily.        Marland Kitchen  lidocaine-prilocaine (EMLA) cream Apply topically as needed. Apply to port-a-cath site 1-2 hours prior to treatment.  30 g  2  . loratadine (CLARITIN) 10 MG tablet Take 10 mg by mouth daily.      Marland Kitchen LORazepam (ATIVAN) 0.5 MG tablet Take 1 tablet (0.5 mg total) by mouth every 6 (six) hours as needed (Nausea or vomiting).  30 tablet  0  . magnesium gluconate (MAGONATE) 500 MG tablet Take 500 mg by mouth daily.      . Multiple  Vitamins-Minerals (MULTIVITAMIN WITH MINERALS) tablet Take 1 tablet by mouth daily.       . naproxen sodium (ANAPROX) 220 MG tablet Take 880 mg by mouth every morning.       . ondansetron (ZOFRAN) 8 MG tablet Take 1 tablet (8 mg total) by mouth 2 (two) times daily. Take two times a day starting the day after chemo for 3 days. Then take two times a day as needed for nausea or vomiting.  30 tablet  1  . oxyCODONE-acetaminophen (PERCOCET) 10-325 MG per tablet Take 1 tablet by mouth every 4 (four) hours as needed for pain.  90 tablet  0  . pegfilgrastim (NEULASTA) 6 MG/0.6ML injection Inject 6 mg into the skin every 21 ( twenty-one) days.      . potassium chloride (K-DUR) 10 MEQ tablet Take 1 tablet (10 mEq total) by mouth 2 (two) times daily.  14 tablet  0  . PRESCRIPTION MEDICATION every 21 ( twenty-one) days. CHEMOTHERAPY REGIMEN      . prochlorperazine (COMPAZINE) 10 MG tablet Take 1 tablet (10 mg total) by mouth every 6 (six) hours as needed (Nausea or vomiting).  30 tablet  1  . promethazine (PHENERGAN) 12.5 MG tablet Take 1 tablet (12.5 mg total) by mouth every 6 (six) hours as needed for nausea.  30 tablet  0  . triamterene-hydrochlorothiazide (MAXZIDE-25) 37.5-25 MG per tablet Take 1 tablet by mouth every morning.        No current facility-administered medications for this visit.    SURGICAL HISTORY:  Past Surgical History  Procedure Laterality Date  . Ankle surgery  1950  . Wisdom tooth extraction    . Tonsillectomy and adenoidectomy    . Hysteroscopy w/d&c    . Abdominal hysterectomy  12/2009    [facility], [facility]  . Laparoscopy N/A 10/02/2012    Procedure: LAPAROSCOPY DIAGNOSTIC, perocentisis, omental biopsy;  Surgeon: Wilmon Arms. Tsuei, MD;  Location: [facility];  Service: General;  Laterality: N/A;  removed a total of 15,000 of acities  . Paracentesis      REVIEW OF SYSTEMS:  Pertinent items are noted in HPI.   HEALTH MAINTENANCE:  PHYSICAL EXAMINATION: Blood pressure 125/79, pulse 69,  temperature 97.8 F (36.6 C), temperature source Oral, resp. rate 20, height 5\' 11"  (1.803 m), weight 440 lb 4.8 oz (199.719 kg). Body mass index is 61.44 kg/(m^2). General: Patient is a morbidly obese well appearing female in no acute distress HEENT: PERRLA, sclerae anicteric no conjunctival pallor, MMM Neck: supple, no palpable adenopathy Lungs: clear to auscultation bilaterally, no wheezes, rhonchi, or rales Cardiovascular: regular rate rhythm, S1, S2, no murmurs, rubs or gallops Abdomen: Soft, non-tender, non-distended, normoactive bowel sounds, no HSM--limited exam due to morbid obesity Extremities: warm and well perfused, no clubbing, cyanosis, or edema Skin: No rashes or lesions Neuro: Non-focal ECOG PERFORMANCE STATUS: 2 - Symptomatic, <50% confined to bed   LABORATORY DATA: Lab Results  Component Value Date   WBC 12.2* 12/16/2012  HGB 10.2* 12/16/2012   HCT 32.4* 12/16/2012   MCV 82.9 12/16/2012   PLT 324 12/16/2012      Chemistry      Component Value Date/Time   NA 141 12/16/2012 0846   NA 135 10/31/2012 0655   K 3.8 12/16/2012 0846   K 3.5 10/31/2012 0655   CL 97 10/31/2012 0655   CL 98 10/07/2012 0923   CO2 26 12/16/2012 0846   CO2 29 10/31/2012 0655   BUN 24.3 12/16/2012 0846   BUN 8 10/31/2012 0655   CREATININE 0.8 12/16/2012 0846   CREATININE 0.78 10/31/2012 0655      Component Value Date/Time   CALCIUM 9.5 12/16/2012 0846   CALCIUM 8.3* 10/31/2012 0655   ALKPHOS 63 12/16/2012 0846   ALKPHOS 55 09/09/2012 1315   AST 29 12/16/2012 0846   AST 25 09/09/2012 1315   ALT 52 12/16/2012 0846   ALT 17 09/09/2012 1315   BILITOT 0.61 12/16/2012 0846   BILITOT 0.4 09/09/2012 1315       RADIOGRAPHIC STUDIES:  Ct Chest W Contrast  10/21/2012   *RADIOLOGY REPORT*  Clinical Data:  Recurrent endometrial cancer.  CT CHEST, ABDOMEN AND PELVIS WITH CONTRAST  Technique: Contiguous axial images of the chest abdomen and pelvis were obtained after IV contrast administration.  Contrast: 80 ml  Omnipaque-300  Comparison: Abdominal pelvic CT of 02/11/2009.  No prior chest CT. Plain film chest of 12/23/2009 is reviewed.  CT CHEST  Findings: Lung windows demonstrate moderate degradation secondary to motion and patient body habitus. No airspace opacities.  Soft tissue windows demonstrate no definite supraclavicular adenopathy.  Mild cardiomegaly, without pericardial or pleural effusion.  No gross mediastinal or hilar adenopathy.  Tiny hiatal hernia.  IMPRESSION:  1.  Moderate degradation secondary to motion and patient body habitus. 2.  Given this factor, no acute process or evidence of metastatic disease within the chest.  CT ABDOMEN AND PELVIS  Findings:  Moderate to marked degradation involving the abdomen. Nondiagnostic evaluation of the pelvis.  Possible hyperenhancing nodule in the hepatic dome 1.7 cm on image 34/series 2.  Grossly normal spleen, distal stomach.  Pancreas not well evaluated.  Gallbladder not evaluated.  Grossly normal adrenal glands and kidneys.  No gross retroperitoneal abdominal adenopathy.  A moderate amount of abdominal ascites.  No gross bowel obstruction.    Pelvic adenopathy cannot be evaluated.  Moderate volume pelvic free fluid.  No gross osseous abnormality.  IMPRESSION:  1.  Moderate to markedly limited evaluation of the abdomen secondary patient body habitus.  Nondiagnostic evaluation of the pelvis. 2.  Moderate volume abdominal pelvic ascites.  No gross retroperitoneal abdominal adenopathy. 3.  Possible hepatic dome nodule, indeterminate.  Not readily apparent on the prior of 2010.  As the patient is not a candidate for MRI, this could be reevaluated on follow-up CTs.   Original Report Authenticated By: Jeronimo Greaves, M.D.   Ct Abdomen Pelvis W Contrast  10/21/2012   *RADIOLOGY REPORT*  Clinical Data:  Recurrent endometrial cancer.  CT CHEST, ABDOMEN AND PELVIS WITH CONTRAST  Technique: Contiguous axial images of the chest abdomen and pelvis were obtained after IV contrast  administration.  Contrast: 80 ml Omnipaque-300  Comparison: Abdominal pelvic CT of 02/11/2009.  No prior chest CT. Plain film chest of 12/23/2009 is reviewed.  CT CHEST  Findings: Lung windows demonstrate moderate degradation secondary to motion and patient body habitus. No airspace opacities.  Soft tissue windows demonstrate no definite supraclavicular adenopathy.  Mild cardiomegaly, without  pericardial or pleural effusion.  No gross mediastinal or hilar adenopathy.  Tiny hiatal hernia.  IMPRESSION:  1.  Moderate degradation secondary to motion and patient body habitus. 2.  Given this factor, no acute process or evidence of metastatic disease within the chest.  CT ABDOMEN AND PELVIS  Findings:  Moderate to marked degradation involving the abdomen. Nondiagnostic evaluation of the pelvis.  Possible hyperenhancing nodule in the hepatic dome 1.7 cm on image 34/series 2.  Grossly normal spleen, distal stomach.  Pancreas not well evaluated.  Gallbladder not evaluated.  Grossly normal adrenal glands and kidneys.  No gross retroperitoneal abdominal adenopathy.  A moderate amount of abdominal ascites.  No gross bowel obstruction.    Pelvic adenopathy cannot be evaluated.  Moderate volume pelvic free fluid.  No gross osseous abnormality.  IMPRESSION:  1.  Moderate to markedly limited evaluation of the abdomen secondary patient body habitus.  Nondiagnostic evaluation of the pelvis. 2.  Moderate volume abdominal pelvic ascites.  No gross retroperitoneal abdominal adenopathy. 3.  Possible hepatic dome nodule, indeterminate.  Not readily apparent on the prior of 2010.  As the patient is not a candidate for MRI, this could be reevaluated on follow-up CTs.   Original Report Authenticated By: Jeronimo Greaves, M.D.   US Paracentesis  10/28/2012   *RADIOLOGY REPORT*  Clinical Data: Recurrent uterine cancer, abdominal distension and discomfort secondary to ascites.  ULTRASOUND GUIDED PARACENTESIS  Comparison:  Previous paracentesis  An  ultrasound guided paracentesis was thoroughly discussed with the patient and questions answered.  The benefits, risks, alternatives and complications were also discussed.  The patient understands and wishes to proceed with the procedure.  Written consent was obtained.  Ultrasound was performed to localize and mark an adequate pocket of fluid in the left upper quadrant of the abdomen.  The area was then prepped and draped in the normal sterile fashion.  1% Lidocaine was used for local anesthesia.  Under ultrasound guidance a 19 gauge Yueh catheter was introduced.  Paracentesis was performed.  The catheter was removed and a dressing applied.  Complications:  None  Findings:  A total of approximately 10 liters of dark amber-colored fluid was removed.  A fluid sample was not sent for laboratory analysis.  IMPRESSION: Successful ultrasound guided paracentesis yielding 10 liters of ascites.  Read by Brayton El PA-C   Original Report Authenticated By: Richarda Overlie, M.D.   US Paracentesis  10/14/2012   *RADIOLOGY REPORT*  Clinical Data: Recurrent uterine cancer, abdominal distension and discomfort secondary to ascites.  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 Patient will be treated with Taxol and carboplatinum palliatively. Port a cath was placed in the OR, and patient will proceed with Taxol Carboplatin beginning 11/04/12.  She is currently cycle 2 day 8.    #2 Ascites: previously underwent paracenteses 1-2 times per week, she hasn't required one in over 1 week.    PLAN:   #1  Doing well today.  Patient's labs are stable.  She will proceed with chemotherapy today.    #2 She will return in one week for labs, an evaluation.    All questions were answered. The patient knows to call the clinic with any problems, questions or concerns. We can certainly see the patient much sooner if necessary.  I spent 25 minutes  counseling the patient face to face. The total time spent in the appointment was 30 minutes.  Cherie Ouch Lyn Hollingshead,  NP Medical Oncology [facility] Phone: [phone]

## 2012-12-16 NOTE — Patient Instructions (Addendum)
[facility] Discharge Instructions for Patients Receiving Chemotherapy  Today you received the following chemotherapy agents: Taxol, Carboplatin   To help prevent nausea and vomiting after your treatment, we encourage you to take your nausea medication as directed by your physician.   If you develop nausea and vomiting that is not controlled by your nausea medication, call the clinic.   BELOW ARE SYMPTOMS THAT SHOULD BE REPORTED IMMEDIATELY:  *FEVER GREATER THAN 100.5 F  *CHILLS WITH OR WITHOUT FEVER  NAUSEA AND VOMITING THAT IS NOT CONTROLLED WITH YOUR NAUSEA MEDICATION  *UNUSUAL SHORTNESS OF BREATH  *UNUSUAL BRUISING OR BLEEDING  TENDERNESS IN MOUTH AND THROAT WITH OR WITHOUT PRESENCE OF ULCERS  *URINARY PROBLEMS  *BOWEL PROBLEMS  UNUSUAL RASH Items with * indicate a potential emergency and should be followed up as soon as possible.  Feel free to call the clinic you have any questions or concerns. The clinic phone number is [phone].    

## 2012-12-17 ENCOUNTER — Ambulatory Visit (HOSPITAL_BASED_OUTPATIENT_CLINIC_OR_DEPARTMENT_OTHER): Payer: BC Managed Care – PPO

## 2012-12-17 VITALS — BP 106/67 | HR 74 | Temp 97.6°F

## 2012-12-17 DIAGNOSIS — C786 Secondary malignant neoplasm of retroperitoneum and peritoneum: Secondary | ICD-10-CM

## 2012-12-17 DIAGNOSIS — C541 Malignant neoplasm of endometrium: Secondary | ICD-10-CM

## 2012-12-17 DIAGNOSIS — Z5189 Encounter for other specified aftercare: Secondary | ICD-10-CM

## 2012-12-17 DIAGNOSIS — C549 Malignant neoplasm of corpus uteri, unspecified: Secondary | ICD-10-CM

## 2012-12-17 MED ORDER — PEGFILGRASTIM INJECTION 6 MG/0.6ML
6.0000 mg | Freq: Once | SUBCUTANEOUS | Status: AC
  Administered 2012-12-17: 6 mg via SUBCUTANEOUS
  Filled 2012-12-17: qty 0.6

## 2012-12-23 ENCOUNTER — Other Ambulatory Visit (HOSPITAL_BASED_OUTPATIENT_CLINIC_OR_DEPARTMENT_OTHER): Payer: BC Managed Care – PPO

## 2012-12-23 ENCOUNTER — Other Ambulatory Visit: Payer: Self-pay

## 2012-12-23 ENCOUNTER — Ambulatory Visit (HOSPITAL_BASED_OUTPATIENT_CLINIC_OR_DEPARTMENT_OTHER): Payer: BC Managed Care – PPO | Admitting: Adult Health

## 2012-12-23 ENCOUNTER — Encounter: Payer: Self-pay | Admitting: Adult Health

## 2012-12-23 VITALS — BP 107/71 | HR 93 | Temp 97.7°F | Resp 20 | Ht 71.0 in | Wt >= 6400 oz

## 2012-12-23 DIAGNOSIS — R180 Malignant ascites: Secondary | ICD-10-CM

## 2012-12-23 DIAGNOSIS — K2970 Gastritis, unspecified, without bleeding: Secondary | ICD-10-CM

## 2012-12-23 DIAGNOSIS — C541 Malignant neoplasm of endometrium: Secondary | ICD-10-CM

## 2012-12-23 DIAGNOSIS — C549 Malignant neoplasm of corpus uteri, unspecified: Secondary | ICD-10-CM

## 2012-12-23 DIAGNOSIS — C786 Secondary malignant neoplasm of retroperitoneum and peritoneum: Secondary | ICD-10-CM

## 2012-12-23 LAB — CBC WITH DIFFERENTIAL/PLATELET
BASO%: 0.3 % (ref 0.0–2.0)
LYMPH%: 14.4 % (ref 14.0–49.7)
MCHC: 32.9 g/dL (ref 31.5–36.0)
MONO#: 0.4 10*3/uL (ref 0.1–0.9)
MONO%: 5.1 % (ref 0.0–14.0)
Platelets: 280 10*3/uL (ref 145–400)
RBC: 3.84 10*6/uL (ref 3.70–5.45)
RDW: 26 % — ABNORMAL HIGH (ref 11.2–14.5)
WBC: 6.9 10*3/uL (ref 3.9–10.3)

## 2012-12-23 LAB — COMPREHENSIVE METABOLIC PANEL (CC13)
ALT: 42 U/L (ref 0–55)
Alkaline Phosphatase: 103 U/L (ref 40–150)
CO2: 26 mEq/L (ref 22–29)
Potassium: 3.4 mEq/L — ABNORMAL LOW (ref 3.5–5.1)
Sodium: 138 mEq/L (ref 136–145)
Total Bilirubin: 0.76 mg/dL (ref 0.20–1.20)
Total Protein: 7.1 g/dL (ref 6.4–8.3)

## 2012-12-23 MED ORDER — OXYCODONE-ACETAMINOPHEN 10-325 MG PO TABS: 1.0000 | ORAL_TABLET | ORAL | Status: DC | PRN

## 2012-12-23 MED ORDER — SUCRALFATE 1 GM/10ML PO SUSP: 1.0000 g | Freq: Four times a day (QID) | ORAL | Status: DC

## 2012-12-23 MED ORDER — PANTOPRAZOLE SODIUM 40 MG PO TBEC: 40.0000 mg | DELAYED_RELEASE_TABLET | Freq: Every day | ORAL | Status: DC

## 2012-12-23 MED ORDER — LORAZEPAM 0.5 MG PO TABS: 0.5000 mg | ORAL_TABLET | Freq: Four times a day (QID) | ORAL | Status: DC | PRN

## 2012-12-23 NOTE — Progress Notes (Signed)
OFFICE PROGRESS NOTE  CC  [name], MD [address] [place] [address] Dr. Laurette Schimke  DIAGNOSIS: 47 year old female with prior history of stage I a uterine cancer diagnosed in 2010. Now with recurrent disease diagnosed in May 2014   PRIOR THERAPY:  #1Patient subsequently developed excessive uterine bleeding. Bad tablet to dilatation and curettage with hysteroscopy. 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 Due to morbid obesity Mirena IUD was placed along with the addition of Aygestin. Patient had a dramatic directed weight loss with nutritional support she went from 523 pounds to 359 pounds in July of 2011. Patient continued to do well and is 12/27/2009 she at that time underwent a robotic-assisted laparoscopic hysterectomy, bilateral salpingo-oophorectomy, and mini laparotomy through the umbilical port with morcellation of uterus within about the delivery of the uterus. The final pathology did reveal an endometrial adenocarcinoma grade 2 with invasion limited to 1 mm of the myometrium  #2continued to do well until June 2014 when she developed right upper quadrant pain. It was presumed to be cholelithiasis. 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 Omental biopsies were obtained. The pathology was consistent with metastatic adenocarcinoma in the presumption of recurrent endometrial carcinoma was performed made. She was seen by Dr. Laurette Schimke on 10/07/2012. She was recommended that she undergo chemotherapy consisting of Taxol and carboplatinum and started this on 11/04/12.  #3 abdominal ascites: Multiple paracenteses  CURRENT THERAPY: Taxol/Carboplatinum cycle 3 day 8  INTERVAL HISTORY: MAIRANY BRUNO 47 y.o. female returns for followup visit today of her endometrial cancer. She had a rough night last  night.  She developed pain in her epigastric area that felt like a knot.  She had difficulty breathing, nausea, vomiting, difficulty eating, and vomited.  Eating makes it worse.  She described it as a burning pain.  She said she had chest pain and pressure.  Her last bowel movement was yesterday.  Otherwise a 10 point ROS is negative.    MEDICAL HISTORY: Past Medical History  Diagnosis Date  . Uterine cancer   . Obesity   . DVT (deep venous thrombosis) 2009  . PONV (postoperative nausea and vomiting)   . Swelling     BOTH LEGS  . Rash     LOWER ABD  . Gallstones   . Ascites     ALLERGIES:  has No Known Allergies.  MEDICATIONS:  Current Outpatient Prescriptions  Medication Sig Dispense Refill  . Ascorbic Acid (VITAMIN C) 1000 MG tablet Take 1,000 mg by mouth daily.      . Cyanocobalamin (VITAMIN B-12 PO) Take 2,500 mg by mouth daily.       Marland Kitchen dexamethasone (DECADRON) 4 MG tablet Take 2 tablets (8 mg total) by mouth 2 (two) times daily with a meal. Take two times a day starting the day after chemotherapy for 3 days.  30 tablet  1  . ferrous sulfate 325 (65 FE) MG EC tablet Take 1 tablet (325 mg total) by mouth 3 (three) times daily with meals.  90 tablet  0  . folic acid (FOLVITE) 400 MCG tablet Take 400 mcg by mouth daily.        Marland Kitchen lidocaine-prilocaine (EMLA) cream Apply topically as needed. Apply to port-a-cath site 1-2 hours prior to treatment.  30 g  2  . loratadine (CLARITIN) 10 MG tablet Take 10 mg  by mouth daily.      Marland Kitchen LORazepam (ATIVAN) 0.5 MG tablet Take 1 tablet (0.5 mg total) by mouth every 6 (six) hours as needed (Nausea or vomiting).  30 tablet  0  . magnesium gluconate (MAGONATE) 500 MG tablet Take 500 mg by mouth daily.      . Multiple Vitamins-Minerals (MULTIVITAMIN WITH MINERALS) tablet Take 1 tablet by mouth daily.       . ondansetron (ZOFRAN) 8 MG tablet Take 1 tablet (8 mg total) by mouth 2 (two) times daily. Take two times a day starting the day after chemo for 3  days. Then take two times a day as needed for nausea or vomiting.  30 tablet  1  . oxyCODONE-acetaminophen (PERCOCET) 10-325 MG per tablet Take 1 tablet by mouth every 4 (four) hours as needed for pain.  90 tablet  0  . prochlorperazine (COMPAZINE) 10 MG tablet Take 1 tablet (10 mg total) by mouth every 6 (six) hours as needed (Nausea or vomiting).  30 tablet  1  . promethazine (PHENERGAN) 12.5 MG tablet Take 1 tablet (12.5 mg total) by mouth every 6 (six) hours as needed for nausea.  30 tablet  0  . triamterene-hydrochlorothiazide (MAXZIDE-25) 37.5-25 MG per tablet Take 1 tablet by mouth every morning.       . pantoprazole (PROTONIX) 40 MG tablet Take 1 tablet (40 mg total) by mouth daily.  30 tablet  6  . pegfilgrastim (NEULASTA) 6 MG/0.6ML injection Inject 6 mg into the skin every 21 ( twenty-one) days.      . potassium chloride (K-DUR) 10 MEQ tablet Take 1 tablet (10 mEq total) by mouth 2 (two) times daily.  14 tablet  0  . PRESCRIPTION MEDICATION every 21 ( twenty-one) days. CHEMOTHERAPY REGIMEN      . sucralfate (CARAFATE) 1 GM/10ML suspension Take 10 mLs (1 g total) by mouth 4 (four) times daily.  420 mL  0   No current facility-administered medications for this visit.    SURGICAL HISTORY:  Past Surgical History  Procedure Laterality Date  . Ankle surgery  1950  . Wisdom tooth extraction    . Tonsillectomy and adenoidectomy    . Hysteroscopy w/d&c    . Abdominal hysterectomy  12/2009    [facility], [facility]  . Laparoscopy N/A 10/02/2012    Procedure: LAPAROSCOPY DIAGNOSTIC, perocentisis, omental biopsy;  Surgeon: Wilmon Arms. Tsuei, MD;  Location: [facility];  Service: General;  Laterality: N/A;  removed a total of 15,000 of acities  . Paracentesis      REVIEW OF SYSTEMS:  Pertinent items are noted in HPI.   HEALTH MAINTENANCE:  PHYSICAL EXAMINATION: Blood pressure 107/71, pulse 93, temperature 97.7 F (36.5 C), temperature source Oral, resp. rate 20, height 5\' 11"  (1.803 m), weight 417 lb 14.4  oz (189.558 kg). Body mass index is 58.31 kg/(m^2). General: Patient is a morbidly obese well appearing female in no acute distress HEENT: PERRLA, sclerae anicteric no conjunctival pallor, MMM Neck: supple, no palpable adenopathy Lungs: clear to auscultation bilaterally, no wheezes, rhonchi, or rales Cardiovascular: regular rate rhythm, S1, S2, no murmurs, rubs or gallops Abdomen: Soft, non-tender, non-distended, normoactive bowel sounds, no HSM--limited exam due to morbid obesity Extremities: warm and well perfused, no clubbing, cyanosis, or edema Skin: No rashes or lesions Neuro: Non-focal ECOG PERFORMANCE STATUS: 2 - Symptomatic, <50% confined to bed   LABORATORY DATA: Lab Results  Component Value Date   WBC 6.9 12/23/2012   HGB 10.5* 12/23/2012  HCT 31.9* 12/23/2012   MCV 83.0 12/23/2012   PLT 280 12/23/2012      Chemistry      Component Value Date/Time   NA 138 12/23/2012 0925   NA 135 10/31/2012 0655   K 3.4* 12/23/2012 0925   K 3.5 10/31/2012 0655   CL 97 10/31/2012 0655   CL 98 10/07/2012 0923   CO2 26 12/23/2012 0925   CO2 29 10/31/2012 0655   BUN 15.7 12/23/2012 0925   BUN 8 10/31/2012 0655   CREATININE 0.8 12/23/2012 0925   CREATININE 0.78 10/31/2012 0655      Component Value Date/Time   CALCIUM 10.0 12/23/2012 0925   CALCIUM 8.3* 10/31/2012 0655   ALKPHOS 103 12/23/2012 0925   ALKPHOS 55 09/09/2012 1315   AST 18 12/23/2012 0925   AST 25 09/09/2012 1315   ALT 42 12/23/2012 0925   ALT 17 09/09/2012 1315   BILITOT 0.76 12/23/2012 0925   BILITOT 0.4 09/09/2012 1315       RADIOGRAPHIC STUDIES:  Ct Chest W Contrast  10/21/2012   *RADIOLOGY REPORT*  Clinical Data:  Recurrent endometrial cancer.  CT CHEST, ABDOMEN AND PELVIS WITH CONTRAST  Technique: Contiguous axial images of the chest abdomen and pelvis were obtained after IV contrast administration.  Contrast: 80 ml Omnipaque-300  Comparison: Abdominal pelvic CT of 02/11/2009.  No prior chest CT. Plain film chest of 12/23/2009 is reviewed.  CT  CHEST  Findings: Lung windows demonstrate moderate degradation secondary to motion and patient body habitus. No airspace opacities.  Soft tissue windows demonstrate no definite supraclavicular adenopathy.  Mild cardiomegaly, without pericardial or pleural effusion.  No gross mediastinal or hilar adenopathy.  Tiny hiatal hernia.  IMPRESSION:  1.  Moderate degradation secondary to motion and patient body habitus. 2.  Given this factor, no acute process or evidence of metastatic disease within the chest.  CT ABDOMEN AND PELVIS  Findings:  Moderate to marked degradation involving the abdomen. Nondiagnostic evaluation of the pelvis.  Possible hyperenhancing nodule in the hepatic dome 1.7 cm on image 34/series 2.  Grossly normal spleen, distal stomach.  Pancreas not well evaluated.  Gallbladder not evaluated.  Grossly normal adrenal glands and kidneys.  No gross retroperitoneal abdominal adenopathy.  A moderate amount of abdominal ascites.  No gross bowel obstruction.    Pelvic adenopathy cannot be evaluated.  Moderate volume pelvic free fluid.  No gross osseous abnormality.  IMPRESSION:  1.  Moderate to markedly limited evaluation of the abdomen secondary patient body habitus.  Nondiagnostic evaluation of the pelvis. 2.  Moderate volume abdominal pelvic ascites.  No gross retroperitoneal abdominal adenopathy. 3.  Possible hepatic dome nodule, indeterminate.  Not readily apparent on the prior of 2010.  As the patient is not a candidate for MRI, this could be reevaluated on follow-up CTs.   Original Report Authenticated By: Jeronimo Greaves, M.D.   Ct Abdomen Pelvis W Contrast  10/21/2012   *RADIOLOGY REPORT*  Clinical Data:  Recurrent endometrial cancer.  CT CHEST, ABDOMEN AND PELVIS WITH CONTRAST  Technique: Contiguous axial images of the chest abdomen and pelvis were obtained after IV contrast administration.  Contrast: 80 ml Omnipaque-300  Comparison: Abdominal pelvic CT of 02/11/2009.  No prior chest CT. Plain film chest  of 12/23/2009 is reviewed.  CT CHEST  Findings: Lung windows demonstrate moderate degradation secondary to motion and patient body habitus. No airspace opacities.  Soft tissue windows demonstrate no definite supraclavicular adenopathy.  Mild cardiomegaly, without pericardial or pleural effusion.  No gross mediastinal or hilar adenopathy.  Tiny hiatal hernia.  IMPRESSION:  1.  Moderate degradation secondary to motion and patient body habitus. 2.  Given this factor, no acute process or evidence of metastatic disease within the chest.  CT ABDOMEN AND PELVIS  Findings:  Moderate to marked degradation involving the abdomen. Nondiagnostic evaluation of the pelvis.  Possible hyperenhancing nodule in the hepatic dome 1.7 cm on image 34/series 2.  Grossly normal spleen, distal stomach.  Pancreas not well evaluated.  Gallbladder not evaluated.  Grossly normal adrenal glands and kidneys.  No gross retroperitoneal abdominal adenopathy.  A moderate amount of abdominal ascites.  No gross bowel obstruction.    Pelvic adenopathy cannot be evaluated.  Moderate volume pelvic free fluid.  No gross osseous abnormality.  IMPRESSION:  1.  Moderate to markedly limited evaluation of the abdomen secondary patient body habitus.  Nondiagnostic evaluation of the pelvis. 2.  Moderate volume abdominal pelvic ascites.  No gross retroperitoneal abdominal adenopathy. 3.  Possible hepatic dome nodule, indeterminate.  Not readily apparent on the prior of 2010.  As the patient is not a candidate for MRI, this could be reevaluated on follow-up CTs.   Original Report Authenticated By: Jeronimo Greaves, M.D.   US Paracentesis  10/28/2012   *RADIOLOGY REPORT*  Clinical Data: Recurrent uterine cancer, abdominal distension and discomfort secondary to ascites.  ULTRASOUND GUIDED PARACENTESIS  Comparison:  Previous paracentesis  An ultrasound guided paracentesis was thoroughly discussed with the patient and questions answered.  The benefits, risks, alternatives  and complications were also discussed.  The patient understands and wishes to proceed with the procedure.  Written consent was obtained.  Ultrasound was performed to localize and mark an adequate pocket of fluid in the left upper quadrant of the abdomen.  The area was then prepped and draped in the normal sterile fashion.  1% Lidocaine was used for local anesthesia.  Under ultrasound guidance a 19 gauge Yueh catheter was introduced.  Paracentesis was performed.  The catheter was removed and a dressing applied.  Complications:  None  Findings:  A total of approximately 10 liters of dark amber-colored fluid was removed.  A fluid sample was not sent for laboratory analysis.  IMPRESSION: Successful ultrasound guided paracentesis yielding 10 liters of ascites.  Read by Brayton El PA-C   Original Report Authenticated By: Richarda Overlie, M.D.   US Paracentesis  10/14/2012   *RADIOLOGY REPORT*  Clinical Data: Recurrent uterine cancer, abdominal distension and discomfort secondary to ascites.  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 Patient will be treated with Taxol and carboplatinum palliatively. Port a cath was placed in the OR, and patient will proceed with Taxol Carboplatin beginning 11/04/12.  She is currently cycle 3 day 8.  #2 Ascites: previously underwent paracenteses 1-2 times per week, she hasn't required one in about one month.     PLAN:   #1  Doing moderately well.  Labs are stable, she is not neutropenic.    #2 She likely has gastritis.  We did an EKG that was reviewed by Dr. Welton Flakes and didn't indicate any ST elevation.  I prescribed daily protonix and carafate four times daily.  I did recommend to her that if she developed chest pain/pressure again she should go to the emergency room due to her risk factors.   #3 She will return in 2 weeks for her next chemotherapy.  She will call to get in  sooner if her  pain is not improved.    All questions were answered. The patient knows to call the clinic with any problems, questions or concerns. We can certainly see the patient much sooner if necessary.  I spent 25 minutes counseling the patient face to face. The total time spent in the appointment was 30 minutes.  Cherie Ouch Lyn Hollingshead, NP Medical Oncology [facility] Phone: [phone]

## 2012-12-23 NOTE — Patient Instructions (Signed)
Sucralfate oral suspension What is this medicine? SUCRALFATE (SOO kral fate) helps to treat ulcers of the intestine. This medicine may be used for other purposes; ask your health care provider or pharmacist if you have questions. What should I tell my health care provider before I take this medicine? They need to know if you have any of these conditions: -kidney disease -an unusual or allergic reaction to sucralfate, other medicines, foods, dyes, or preservatives -pregnant or trying to get pregnant -breast-feeding How should I use this medicine? Take this medicine by mouth with a glass of water. Follow the directions on the prescription label. Shake well before using. Use a specially marked spoon or container to measure your medicine. Ask your pharmacist if you do not have one. Household spoons are not accurate. This medicine works best if you take it on an empty stomach, 1 hour before meals. Take your doses at regular intervals. Do not take your medicine more often than directed. Do not stop taking except on your doctor's advice. Talk to your pediatrician regarding the use of this medicine in children. Special care may be needed. Overdosage: If you think you have taken too much of this medicine contact a poison control center or emergency room at once. NOTE: This medicine is only for you. Do not share this medicine with others. What if I miss a dose? If you miss a dose, take it as soon as you can. If it is almost time for your next dose, take only that dose. Do not take double or extra doses. What may interact with this medicine? -antacid -cimetidine -digoxin -ketoconazole -phenytoin -quinidine -ranitidine -some antibiotics like ciprofloxacin, norfloxacin, and ofloxacin -theophylline -thyroid hormones -warfarin This list may not describe all possible interactions. Give your health care provider a list of all the medicines, herbs, non-prescription drugs, or dietary supplements you use.  Also tell them if you smoke, drink alcohol, or use illegal drugs. Some items may interact with your medicine. What should I watch for while using this medicine? Visit your doctor or health care professional for regular check ups. Let your doctor know if your symptoms do not improve or if you feel worse. Antacids should not be taken within one half hour before or after this medicine. What side effects may I notice from receiving this medicine? Side effects that you should report to your doctor or health care professional as soon as possible: -allergic reactions like skin rash, itching or hives, swelling of the face, lips, or tongue -difficulty breathing Side effects that usually do not require medical attention (report to your doctor or health care professional if they continue or are bothersome): -back pain -constipation -drowsy, dizzy -dry mouth -headache -stomach upset, gas -trouble sleeping This list may not describe all possible side effects. Call your doctor for medical advice about side effects. You may report side effects to FDA at 1-800-FDA-1088. Where should I keep my medicine? Keep out of the reach of children. Store at room temperature between 20 and 25 degrees C (68 and 77 degrees F). Keep container tightly closed. Throw away any unused medicine after the expiration date. NOTE: This sheet is a summary. It may not cover all possible information. If you have questions about this medicine, talk to your doctor, pharmacist, or health care provider.  2013, Elsevier/Gold Standard. (12/10/2007 3:47:18 PM) Pantoprazole tablets What is this medicine? PANTOPRAZOLE (pan TOE pra zole) prevents the production of acid in the stomach. It is used to treat gastroesophageal reflux disease (GERD), inflammation of  the esophagus, and Zollinger-Ellison syndrome. This medicine may be used for other purposes; ask your health care provider or pharmacist if you have questions. What should I tell my health  care provider before I take this medicine? They need to know if you have any of these conditions: -liver disease -low levels of magnesium in the blood -an unusual or allergic reaction to omeprazole, lansoprazole, pantoprazole, rabeprazole, other medicines, foods, dyes, or preservatives -pregnant or trying to get pregnant -breast-feeding How should I use this medicine? Take this medicine by mouth. Swallow the tablets whole with a drink of water. Follow the directions on the prescription label. Do not crush, break, or chew. Take your medicine at regular intervals. Do not take your medicine more often than directed. Talk to your pediatrician regarding the use of this medicine in children. While this drug may be prescribed for children as young as 5 years for selected conditions, precautions do apply. Overdosage: If you think you have taken too much of this medicine contact a poison control center or emergency room at once. NOTE: This medicine is only for you. Do not share this medicine with others. What if I miss a dose? If you miss a dose, take it as soon as you can. If it is almost time for your next dose, take only that dose. Do not take double or extra doses. What may interact with this medicine? Do not take this medicine with any of the following medications: -atazanavir -nelfinavir This medicine may also interact with the following medications: -ampicillin -delavirdine -digoxin -diuretics -iron salts -medicines for fungal infections like ketoconazole, itraconazole and voriconazole -warfarin This list may not describe all possible interactions. Give your health care provider a list of all the medicines, herbs, non-prescription drugs, or dietary supplements you use. Also tell them if you smoke, drink alcohol, or use illegal drugs. Some items may interact with your medicine. What should I watch for while using this medicine? It can take several days before your stomach pain gets better.  Check with your doctor or health care professional if your condition does not start to get better, or if it gets worse. You may need blood work done while you are taking this medicine. What side effects may I notice from receiving this medicine? Side effects that you should report to your doctor or health care professional as soon as possible: -allergic reactions like skin rash, itching or hives, swelling of the face, lips, or tongue -bone, muscle or joint pain -breathing problems -chest pain or chest tightness -dark yellow or brown urine -dizziness -fast, irregular heartbeat -feeling faint or lightheaded -fever or sore throat -muscle spasm -palpitations -redness, blistering, peeling or loosening of the skin, including inside the mouth -seizures -tremors -unusual bleeding or bruising -unusually weak or tired -yellowing of the eyes or skin Side effects that usually do not require medical attention (Report these to your doctor or health care professional if they continue or are bothersome.): -constipation -diarrhea -dry mouth -headache -nausea This list may not describe all possible side effects. Call your doctor for medical advice about side effects. You may report side effects to FDA at 1-800-FDA-1088. Where should I keep my medicine? Keep out of the reach of children. Store at room temperature between 15 and 30 degrees C (59 and 86 degrees F). Protect from light and moisture. Throw away any unused medicine after the expiration date. NOTE: This sheet is a summary. It may not cover all possible information. If you have questions about this medicine,  talk to your doctor, pharmacist, or health care provider.  2013, Elsevier/Gold Standard. (06/28/2009 12:03:53 PM)

## 2012-12-25 ENCOUNTER — Encounter: Payer: Self-pay | Admitting: Medical Oncology

## 2012-12-28 ENCOUNTER — Telehealth: Payer: Self-pay | Admitting: Oncology

## 2012-12-28 NOTE — Telephone Encounter (Signed)
S/w pt re add on appts for sept thru nov. Pt will get new schedule 9/16.

## 2012-12-29 ENCOUNTER — Other Ambulatory Visit: Payer: Self-pay | Admitting: Adult Health

## 2012-12-29 ENCOUNTER — Other Ambulatory Visit: Payer: Self-pay | Admitting: *Deleted

## 2012-12-29 DIAGNOSIS — C541 Malignant neoplasm of endometrium: Secondary | ICD-10-CM

## 2012-12-29 MED ORDER — DEXAMETHASONE 4 MG PO TABS: 8.0000 mg | ORAL_TABLET | Freq: Two times a day (BID) | ORAL | Status: DC

## 2012-12-31 ENCOUNTER — Ambulatory Visit: Payer: BC Managed Care – PPO

## 2013-01-01 ENCOUNTER — Telehealth: Payer: Self-pay | Admitting: *Deleted

## 2013-01-01 NOTE — Telephone Encounter (Signed)
Patient called reporting she needs a wheelchair to return to work.  Asked when start date is and it is ASAP.  Sense of urgency because she has bills and having been out of work since may needs to pay bills.   1. "Dr. Welton Flakes needs to fax an order for a wheelchair to [facility] fax number [phone].   2. A  Call to them at [phone] for a "Face to Face " with the reason why I need a wheelchair must also be done.    Will notify Dr. Welton Flakes of patient's request.

## 2013-01-06 ENCOUNTER — Other Ambulatory Visit (HOSPITAL_BASED_OUTPATIENT_CLINIC_OR_DEPARTMENT_OTHER): Payer: BC Managed Care – PPO | Admitting: Lab

## 2013-01-06 ENCOUNTER — Encounter: Payer: Self-pay | Admitting: Oncology

## 2013-01-06 ENCOUNTER — Ambulatory Visit (HOSPITAL_BASED_OUTPATIENT_CLINIC_OR_DEPARTMENT_OTHER): Payer: BC Managed Care – PPO | Admitting: Oncology

## 2013-01-06 ENCOUNTER — Ambulatory Visit (HOSPITAL_BASED_OUTPATIENT_CLINIC_OR_DEPARTMENT_OTHER): Payer: BC Managed Care – PPO

## 2013-01-06 VITALS — BP 110/75 | HR 76 | Temp 97.9°F | Resp 18 | Ht 71.0 in | Wt >= 6400 oz

## 2013-01-06 DIAGNOSIS — C549 Malignant neoplasm of corpus uteri, unspecified: Secondary | ICD-10-CM

## 2013-01-06 DIAGNOSIS — C541 Malignant neoplasm of endometrium: Secondary | ICD-10-CM

## 2013-01-06 DIAGNOSIS — Z5111 Encounter for antineoplastic chemotherapy: Secondary | ICD-10-CM

## 2013-01-06 DIAGNOSIS — C786 Secondary malignant neoplasm of retroperitoneum and peritoneum: Secondary | ICD-10-CM

## 2013-01-06 LAB — COMPREHENSIVE METABOLIC PANEL (CC13)
ALT: 53 U/L (ref 0–55)
AST: 16 U/L (ref 5–34)
Albumin: 3.2 g/dL — ABNORMAL LOW (ref 3.5–5.0)
Alkaline Phosphatase: 63 U/L (ref 40–150)
Potassium: 4.2 mEq/L (ref 3.5–5.1)
Sodium: 140 mEq/L (ref 136–145)
Total Bilirubin: 0.55 mg/dL (ref 0.20–1.20)
Total Protein: 6 g/dL — ABNORMAL LOW (ref 6.4–8.3)

## 2013-01-06 LAB — CBC WITH DIFFERENTIAL/PLATELET
BASO%: 0.1 % (ref 0.0–2.0)
Basophils Absolute: 0 10*3/uL (ref 0.0–0.1)
EOS%: 0 % (ref 0.0–7.0)
HGB: 11.5 g/dL — ABNORMAL LOW (ref 11.6–15.9)
MCH: 29.3 pg (ref 25.1–34.0)
MCHC: 31.8 g/dL (ref 31.5–36.0)
MCV: 92.1 fL (ref 79.5–101.0)
MONO%: 6.4 % (ref 0.0–14.0)
RBC: 3.93 10*6/uL (ref 3.70–5.45)
RDW: 24.5 % — ABNORMAL HIGH (ref 11.2–14.5)
lymph#: 1 10*3/uL (ref 0.9–3.3)
nRBC: 0 % (ref 0–0)

## 2013-01-06 MED ORDER — FAMOTIDINE IN NACL 20-0.9 MG/50ML-% IV SOLN
20.0000 mg | Freq: Once | INTRAVENOUS | Status: AC
  Administered 2013-01-06: 20 mg via INTRAVENOUS

## 2013-01-06 MED ORDER — DIPHENHYDRAMINE HCL 50 MG/ML IJ SOLN
50.0000 mg | Freq: Once | INTRAMUSCULAR | Status: AC
  Administered 2013-01-06: 50 mg via INTRAVENOUS

## 2013-01-06 MED ORDER — PACLITAXEL CHEMO INJECTION 300 MG/50ML
175.0000 mg/m2 | Freq: Once | INTRAVENOUS | Status: AC
  Administered 2013-01-06: 618 mg via INTRAVENOUS
  Filled 2013-01-06: qty 103

## 2013-01-06 MED ORDER — SODIUM CHLORIDE 0.9 % IV SOLN
Freq: Once | INTRAVENOUS | Status: AC
  Administered 2013-01-06: 10:00:00 via INTRAVENOUS

## 2013-01-06 MED ORDER — HEPARIN SOD (PORK) LOCK FLUSH 100 UNIT/ML IV SOLN
500.0000 [IU] | Freq: Once | INTRAVENOUS | Status: AC | PRN
  Administered 2013-01-06: 500 [IU]
  Filled 2013-01-06: qty 5

## 2013-01-06 MED ORDER — PALONOSETRON HCL INJECTION 0.25 MG/5ML
INTRAVENOUS | Status: AC
  Filled 2013-01-06: qty 5

## 2013-01-06 MED ORDER — SODIUM CHLORIDE 0.9 % IJ SOLN
10.0000 mL | INTRAMUSCULAR | Status: DC | PRN
  Administered 2013-01-06: 10 mL
  Filled 2013-01-06: qty 10

## 2013-01-06 MED ORDER — PALONOSETRON HCL INJECTION 0.25 MG/5ML
0.2500 mg | Freq: Once | INTRAVENOUS | Status: AC
  Administered 2013-01-06: 0.25 mg via INTRAVENOUS

## 2013-01-06 MED ORDER — DEXAMETHASONE SODIUM PHOSPHATE 20 MG/5ML IJ SOLN
12.0000 mg | Freq: Once | INTRAMUSCULAR | Status: AC
  Administered 2013-01-06: 12 mg via INTRAVENOUS

## 2013-01-06 MED ORDER — FAMOTIDINE IN NACL 20-0.9 MG/50ML-% IV SOLN
INTRAVENOUS | Status: AC
  Filled 2013-01-06: qty 50

## 2013-01-06 MED ORDER — ONDANSETRON 16 MG/50ML IVPB (CHCC)
INTRAVENOUS | Status: AC
  Filled 2013-01-06: qty 16

## 2013-01-06 MED ORDER — SODIUM CHLORIDE 0.9 % IV SOLN
900.0000 mg | Freq: Once | INTRAVENOUS | Status: AC
  Administered 2013-01-06: 900 mg via INTRAVENOUS
  Filled 2013-01-06: qty 90

## 2013-01-06 MED ORDER — SODIUM CHLORIDE 0.9 % IV SOLN
150.0000 mg | Freq: Once | INTRAVENOUS | Status: AC
  Administered 2013-01-06: 150 mg via INTRAVENOUS
  Filled 2013-01-06: qty 5

## 2013-01-06 MED ORDER — DEXAMETHASONE SODIUM PHOSPHATE 20 MG/5ML IJ SOLN
INTRAMUSCULAR | Status: AC
  Filled 2013-01-06: qty 5

## 2013-01-06 MED ORDER — DIPHENHYDRAMINE HCL 50 MG/ML IJ SOLN
INTRAMUSCULAR | Status: AC
  Filled 2013-01-06: qty 1

## 2013-01-06 NOTE — Patient Instructions (Addendum)
[facility] Discharge Instructions for Patients Receiving Chemotherapy  Today you received the following chemotherapy agents: Taxol and Carboplatin.  To help prevent nausea and vomiting after your treatment, we encourage you to take your nausea medication as prescribed.   If you develop nausea and vomiting that is not controlled by your nausea medication, call the clinic.   BELOW ARE SYMPTOMS THAT SHOULD BE REPORTED IMMEDIATELY:  *FEVER GREATER THAN 100.5 F  *CHILLS WITH OR WITHOUT FEVER  NAUSEA AND VOMITING THAT IS NOT CONTROLLED WITH YOUR NAUSEA MEDICATION  *UNUSUAL SHORTNESS OF BREATH  *UNUSUAL BRUISING OR BLEEDING  TENDERNESS IN MOUTH AND THROAT WITH OR WITHOUT PRESENCE OF ULCERS  *URINARY PROBLEMS  *BOWEL PROBLEMS  UNUSUAL RASH Items with * indicate a potential emergency and should be followed up as soon as possible.  Feel free to call the clinic you have any questions or concerns. The clinic phone number is [phone].    

## 2013-01-06 NOTE — Patient Instructions (Addendum)
Proceed with chemotherapy today  Return in 1 week for follow up

## 2013-01-07 ENCOUNTER — Ambulatory Visit (HOSPITAL_BASED_OUTPATIENT_CLINIC_OR_DEPARTMENT_OTHER): Payer: BC Managed Care – PPO

## 2013-01-07 ENCOUNTER — Other Ambulatory Visit: Payer: Self-pay | Admitting: Emergency Medicine

## 2013-01-07 VITALS — BP 104/74 | HR 86 | Temp 97.5°F

## 2013-01-07 DIAGNOSIS — C541 Malignant neoplasm of endometrium: Secondary | ICD-10-CM

## 2013-01-07 DIAGNOSIS — C549 Malignant neoplasm of corpus uteri, unspecified: Secondary | ICD-10-CM

## 2013-01-07 DIAGNOSIS — Z5189 Encounter for other specified aftercare: Secondary | ICD-10-CM

## 2013-01-07 MED ORDER — PEGFILGRASTIM INJECTION 6 MG/0.6ML
6.0000 mg | Freq: Once | SUBCUTANEOUS | Status: AC
  Administered 2013-01-07: 6 mg via SUBCUTANEOUS
  Filled 2013-01-07: qty 0.6

## 2013-01-07 MED ORDER — UNABLE TO FIND: Status: DC

## 2013-01-08 ENCOUNTER — Encounter: Payer: Self-pay | Admitting: Gynecologic Oncology

## 2013-01-08 ENCOUNTER — Ambulatory Visit: Payer: BC Managed Care – PPO | Attending: Gynecologic Oncology | Admitting: Gynecologic Oncology

## 2013-01-08 ENCOUNTER — Other Ambulatory Visit: Payer: Self-pay | Admitting: Adult Health

## 2013-01-08 VITALS — BP 118/74 | HR 92 | Temp 97.9°F | Resp 18 | Ht 71.0 in

## 2013-01-08 DIAGNOSIS — R609 Edema, unspecified: Secondary | ICD-10-CM | POA: Insufficient documentation

## 2013-01-08 DIAGNOSIS — Z9221 Personal history of antineoplastic chemotherapy: Secondary | ICD-10-CM | POA: Insufficient documentation

## 2013-01-08 DIAGNOSIS — C549 Malignant neoplasm of corpus uteri, unspecified: Secondary | ICD-10-CM | POA: Insufficient documentation

## 2013-01-08 DIAGNOSIS — Z90710 Acquired absence of both cervix and uterus: Secondary | ICD-10-CM | POA: Insufficient documentation

## 2013-01-08 DIAGNOSIS — C786 Secondary malignant neoplasm of retroperitoneum and peritoneum: Secondary | ICD-10-CM | POA: Insufficient documentation

## 2013-01-08 DIAGNOSIS — Z9079 Acquired absence of other genital organ(s): Secondary | ICD-10-CM | POA: Insufficient documentation

## 2013-01-08 DIAGNOSIS — Z86718 Personal history of other venous thrombosis and embolism: Secondary | ICD-10-CM | POA: Insufficient documentation

## 2013-01-08 DIAGNOSIS — C541 Malignant neoplasm of endometrium: Secondary | ICD-10-CM

## 2013-01-08 NOTE — Progress Notes (Signed)
Office Visit:  GYN ONCOLOGY   [name] 47 y.o. female  CC: Endometrial cancer surveillance  Assessment/Plan: Catherine. Marquez is a 47 y.o. with Stage IA, grade 2 endometrial adenocarcinoma initially diagnosed in March of 2009. Robotic hysterectomy bilateral salpingo-oophorectomy with minilaparotomy for removal of the specimen was performed in September 2011. She presented with evidence of peritoneal carcinomatosis and pathology consistent with glandular adenocarcinoma in May of 2014 presumptive endometrial cancer recurrence.  [name] is  s/p 4 cycles Taxol and platinum therapy.     The patient's volume of distribution is much larger than the normal patient because of her weight.  The taxol/carbo formulas have upper limits of administration.  She does not have alopecia or any of the complaints usually associated with these agents.  However she is responding and has not required a paracentesis for several weeks.  Reimage and check CA 125 after cycle 6.  Consider extending therapy for a total of 9 cycles.  Mental health Patient has not been as ambulatory since her diagnosis.  Will refer to OT/PT so that she has the necessary transfer skills to make her safe at work and to facilitate ambulation once more.    Patient has a medical condition causing mobility impairment and is unable to safely ambulate with a cane or walker.  She has expressed a willingness to use the wheelchair and has sufficient upper body strength to self-propel the wheelchair.  She would benefit from having a manual wheelchair to perform ADLs in the home and at work.      Followup in 3 months   HPI:  [name] is a 47 y.o.  diagnosed with a DVT in March of 2009 with Coumadin initiated at that time.  Excessive uterine bleeding developed leading to a dilatation and curettage with hysteroscopy.  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  Due to her morbid obesity, a Mirena IUD was placed along  with the addition of Aygestin.  After dramatic, directed weight loss and nutritional support, she went from 523 lbs on her initial visit to 359 lbs in July of 2011.  Her BMI was reduced to 50.  On December 27, 2009, she underwent a robotic-assisted laparoscopic hysterectomy, bilateral salpingo-oophorectomy, and mini-laparotomy through the umbilical port with morcellation of the uterus within a bag for delivery of the uterus. 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 Omental biopsies were obtained. Pathology was consistent with metastatic adenocarcinoma.  INTERVAL HISTORY [name] has received 4 cycles of taxol/ carboplatin.  She still does not have alopecia.  She denies constipation, neuropathy.  She is quite distressed about her inability to ambulate and for her mental health needs to return to work.    Past Surgical Hx:  Past Surgical History  Procedure Laterality Date  . Ankle surgery  1950  . Wisdom tooth extraction    . Tonsillectomy and adenoidectomy    . Hysteroscopy w/d&c    . Abdominal hysterectomy  12/2009    [facility], [facility]  . Laparoscopy N/A 10/02/2012    Procedure: LAPAROSCOPY DIAGNOSTIC, perocentisis, omental biopsy;  Surgeon: Wilmon Arms. Tsuei, MD;  Location: [facility];  Service: General;  Laterality: N/A;  removed a total of 15,000 of acities  . Paracentesis  Past Medical Hx:  Past Medical History  Diagnosis Date  . Uterine cancer   . Obesity   . DVT (deep venous thrombosis) 2009  . PONV (postoperative nausea and vomiting)   . Swelling     BOTH LEGS  . Rash     LOWER ABD  . Gallstones   . Ascites    Family Hx:  Family History  Problem Relation Age of Onset  . Heart disease Father   . Diabetes  Brother   . Heart disease Mother    REVIEW OF SYSTEMS Constitutional  Cardiovascular  No chest pain, shortness of breath, or edema  Pulmonary  No cough or wheeze.  Gastro Intestinal  No nausea, vomitting, or diarrhoea. No bright red blood per rectum, no abdominal pain, change in bowel movement, or constipation. No bloating. Genito Urinary   Denies vaginal bleeding Musculo Skeletal  No myalgia, arthralgia, joint swelling or pain  Neurologic  No weakness, numbness, change in gait,  Psychology  No depression, anxiety, insomnia.    Vitals:  Blood pressure 118/74, pulse 92, temperature 97.9 F (36.6 C), resp. rate 18, height 5\' 11"  (1.803 m), weight 0 lb (0 kg).  Physical Exam:  General:  Well developed, morbidly obese in NAD. Significant amount of hair on her head. Chest:  CTA Cardiac RRR. Abdomen: Obese soft, non distended, non tender Extremities:  2-3+ bilateral edema.  Back:  No CVAT   Laurette Schimke, NP 01/08/2013, 2:26 PM

## 2013-01-11 NOTE — Progress Notes (Signed)
OFFICE PROGRESS NOTE  CC  [name], MD [address] [place] [address] Dr. Laurette Schimke  DIAGNOSIS: 47 year old female with prior history of stage I a uterine cancer diagnosed in 2010. Now with recurrent disease diagnosed in May 2014   PRIOR THERAPY:  #1Patient subsequently developed excessive uterine bleeding. Bad tablet to dilatation and curettage with hysteroscopy. 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 Due to morbid obesity Mirena IUD was placed along with the addition of Aygestin. Patient had a dramatic directed weight loss with nutritional support she went from 523 pounds to 359 pounds in July of 2011. Patient continued to do well and is 12/27/2009 she at that time underwent a robotic-assisted laparoscopic hysterectomy, bilateral salpingo-oophorectomy, and mini laparotomy through the umbilical port with morcellation of uterus within about the delivery of the uterus. The final pathology did reveal an endometrial adenocarcinoma grade 2 with invasion limited to 1 mm of the myometrium  #2continued to do well until June 2014 when she developed right upper quadrant pain. It was presumed to be cholelithiasis. 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 Omental biopsies were obtained. The pathology was consistent with metastatic adenocarcinoma in the presumption of recurrent endometrial carcinoma was performed made. She was seen by Dr. Laurette Schimke on 10/07/2012. She was recommended that she undergo chemotherapy consisting of Taxol and carboplatinum and started this on 11/04/12.  #3 abdominal ascites: Multiple paracenteses  CURRENT THERAPY: Taxol/Carboplatinum cycle 4 day 1  INTERVAL HISTORY: [name] 47 y.o. female returns for followup visit today of her endometrial cancer. She had a rough night last  night.  She developed pain in her epigastric area that felt like a knot.  The pain has resolved. She had difficulty breathing, nausea, vomiting, difficulty eating, and vomited.  Eating makes it worse.  She described it as a burning pain.  She said she had chest pain and pressure.  Her last bowel movement was yesterday.  Otherwise a 10 point ROS is negative.    MEDICAL HISTORY: Past Medical History  Diagnosis Date  . Uterine cancer   . Obesity   . DVT (deep venous thrombosis) 2009  . PONV (postoperative nausea and vomiting)   . Swelling     BOTH LEGS  . Rash     LOWER ABD  . Gallstones   . Ascites     ALLERGIES:  has No Known Allergies.  MEDICATIONS:  Current Outpatient Prescriptions  Medication Sig Dispense Refill  . Ascorbic Acid (VITAMIN C) 1000 MG tablet Take 1,000 mg by mouth daily.      Marland Kitchen CARAFATE 1 GM/10ML suspension TAKE 10 MILLILITERS BY MOUTH 4 TIMES A DAY  420 mL  0  . Cyanocobalamin (VITAMIN B-12 PO) Take 2,500 mg by mouth daily.       Marland Kitchen dexamethasone (DECADRON) 4 MG tablet Take 2 tablets (8 mg total) by mouth 2 (two) times daily with a meal. Take two times a day starting the day after chemotherapy for 3 days.  30 tablet  1  . ferrous sulfate 325 (65 FE) MG tablet TAKE 1 TABLET (325 MG TOTAL) BY MOUTH 3 (THREE) TIMES DAILY WITH MEALS.  90 tablet  0  . folic acid (FOLVITE) 400 MCG tablet Take 400 mcg by mouth daily.        Marland Kitchen lidocaine-prilocaine (EMLA) cream Apply topically as needed. Apply  to port-a-cath site 1-2 hours prior to treatment.  30 g  2  . loratadine (CLARITIN) 10 MG tablet Take 10 mg by mouth daily.      Marland Kitchen LORazepam (ATIVAN) 0.5 MG tablet Take 1 tablet (0.5 mg total) by mouth every 6 (six) hours as needed (Nausea or vomiting).  30 tablet  0  . magnesium gluconate (MAGONATE) 500 MG tablet Take 500 mg by mouth daily.      . Multiple Vitamins-Minerals (MULTIVITAMIN WITH MINERALS) tablet Take 1 tablet by mouth daily.       . ondansetron (ZOFRAN) 8 MG tablet Take 1  tablet (8 mg total) by mouth 2 (two) times daily. Take two times a day starting the day after chemo for 3 days. Then take two times a day as needed for nausea or vomiting.  30 tablet  1  . oxyCODONE-acetaminophen (PERCOCET) 10-325 MG per tablet Take 1 tablet by mouth every 4 (four) hours as needed for pain.  90 tablet  0  . pantoprazole (PROTONIX) 40 MG tablet Take 1 tablet (40 mg total) by mouth daily.  30 tablet  6  . pegfilgrastim (NEULASTA) 6 MG/0.6ML injection Inject 6 mg into the skin every 21 ( twenty-one) days.      . potassium chloride (K-DUR) 10 MEQ tablet Take 1 tablet (10 mEq total) by mouth 2 (two) times daily.  14 tablet  0  . PRESCRIPTION MEDICATION every 21 ( twenty-one) days. CHEMOTHERAPY REGIMEN      . prochlorperazine (COMPAZINE) 10 MG tablet Take 1 tablet (10 mg total) by mouth every 6 (six) hours as needed (Nausea or vomiting).  30 tablet  1  . promethazine (PHENERGAN) 12.5 MG tablet Take 1 tablet (12.5 mg total) by mouth every 6 (six) hours as needed for nausea.  30 tablet  0  . triamterene-hydrochlorothiazide (MAXZIDE-25) 37.5-25 MG per tablet Take 1 tablet by mouth every morning.       Marland Kitchen UNABLE TO FIND Super heavy duty wheelchair with anit-tippers and leg rests with heel loops  Diagnosis: Endometrial cancer 182.0; Ascites 789.59; severe fatigue 780.79  1 Units  0   No current facility-administered medications for this visit.    SURGICAL HISTORY:  Past Surgical History  Procedure Laterality Date  . Ankle surgery  1950  . Wisdom tooth extraction    . Tonsillectomy and adenoidectomy    . Hysteroscopy w/d&c    . Abdominal hysterectomy  12/2009    [facility], [facility]  . Laparoscopy N/A 10/02/2012    Procedure: LAPAROSCOPY DIAGNOSTIC, perocentisis, omental biopsy;  Surgeon: Wilmon Arms. Tsuei, MD;  Location: [facility];  Service: General;  Laterality: N/A;  removed a total of 15,000 of acities  . Paracentesis      REVIEW OF SYSTEMS:  Pertinent items are noted in HPI.   HEALTH  MAINTENANCE:  PHYSICAL EXAMINATION: Blood pressure 110/75, pulse 76, temperature 97.9 F (36.6 C), temperature source Oral, resp. rate 18, height 5\' 11"  (1.803 m), weight 432 lb 9.6 oz (196.226 kg), SpO2 97.00%. Body mass index is 60.36 kg/(m^2). General: Patient is a morbidly obese well appearing female in no acute distress HEENT: PERRLA, sclerae anicteric no conjunctival pallor, MMM Neck: supple, no palpable adenopathy Lungs: clear to auscultation bilaterally, no wheezes, rhonchi, or rales Cardiovascular: regular rate rhythm, S1, S2, no murmurs, rubs or gallops Abdomen: Soft, non-tender, non-distended, normoactive bowel sounds, no HSM--limited exam due to morbid obesity Extremities: warm and well perfused, no clubbing, cyanosis, or edema Skin: No rashes or lesions Neuro:  Non-focal ECOG PERFORMANCE STATUS: 2 - Symptomatic, <50% confined to bed   LABORATORY DATA: Lab Results  Component Value Date   WBC 9.5 01/06/2013   HGB 11.5* 01/06/2013   HCT 36.2 01/06/2013   MCV 92.1 01/06/2013   PLT 162 01/06/2013      Chemistry      Component Value Date/Time   NA 140 01/06/2013 0801   NA 135 10/31/2012 0655   K 4.2 01/06/2013 0801   K 3.5 10/31/2012 0655   CL 97 10/31/2012 0655   CL 98 10/07/2012 0923   CO2 32* 01/06/2013 0801   CO2 29 10/31/2012 0655   BUN 18.8 01/06/2013 0801   BUN 8 10/31/2012 0655   CREATININE 0.7 01/06/2013 0801   CREATININE 0.78 10/31/2012 0655      Component Value Date/Time   CALCIUM 9.2 01/06/2013 0801   CALCIUM 8.3* 10/31/2012 0655   ALKPHOS 63 01/06/2013 0801   ALKPHOS 55 09/09/2012 1315   AST 16 01/06/2013 0801   AST 25 09/09/2012 1315   ALT 53 01/06/2013 0801   ALT 17 09/09/2012 1315   BILITOT 0.55 01/06/2013 0801   BILITOT 0.4 09/09/2012 1315       RADIOGRAPHIC STUDIES:  Ct Chest W Contrast  10/21/2012   *RADIOLOGY REPORT*  Clinical Data:  Recurrent endometrial cancer.  CT CHEST, ABDOMEN AND PELVIS WITH CONTRAST  Technique: Contiguous axial images of the chest  abdomen and pelvis were obtained after IV contrast administration.  Contrast: 80 ml Omnipaque-300  Comparison: Abdominal pelvic CT of 02/11/2009.  No prior chest CT. Plain film chest of 12/23/2009 is reviewed.  CT CHEST  Findings: Lung windows demonstrate moderate degradation secondary to motion and patient body habitus. No airspace opacities.  Soft tissue windows demonstrate no definite supraclavicular adenopathy.  Mild cardiomegaly, without pericardial or pleural effusion.  No gross mediastinal or hilar adenopathy.  Tiny hiatal hernia.  IMPRESSION:  1.  Moderate degradation secondary to motion and patient body habitus. 2.  Given this factor, no acute process or evidence of metastatic disease within the chest.  CT ABDOMEN AND PELVIS  Findings:  Moderate to marked degradation involving the abdomen. Nondiagnostic evaluation of the pelvis.  Possible hyperenhancing nodule in the hepatic dome 1.7 cm on image 34/series 2.  Grossly normal spleen, distal stomach.  Pancreas not well evaluated.  Gallbladder not evaluated.  Grossly normal adrenal glands and kidneys.  No gross retroperitoneal abdominal adenopathy.  A moderate amount of abdominal ascites.  No gross bowel obstruction.    Pelvic adenopathy cannot be evaluated.  Moderate volume pelvic free fluid.  No gross osseous abnormality.  IMPRESSION:  1.  Moderate to markedly limited evaluation of the abdomen secondary patient body habitus.  Nondiagnostic evaluation of the pelvis. 2.  Moderate volume abdominal pelvic ascites.  No gross retroperitoneal abdominal adenopathy. 3.  Possible hepatic dome nodule, indeterminate.  Not readily apparent on the prior of 2010.  As the patient is not a candidate for MRI, this could be reevaluated on follow-up CTs.   Original Report Authenticated By: Jeronimo Greaves, M.D.   Ct Abdomen Pelvis W Contrast  10/21/2012   *RADIOLOGY REPORT*  Clinical Data:  Recurrent endometrial cancer.  CT CHEST, ABDOMEN AND PELVIS WITH CONTRAST  Technique:  Contiguous axial images of the chest abdomen and pelvis were obtained after IV contrast administration.  Contrast: 80 ml Omnipaque-300  Comparison: Abdominal pelvic CT of 02/11/2009.  No prior chest CT. Plain film chest of 12/23/2009 is reviewed.  CT CHEST  Findings:  Lung windows demonstrate moderate degradation secondary to motion and patient body habitus. No airspace opacities.  Soft tissue windows demonstrate no definite supraclavicular adenopathy.  Mild cardiomegaly, without pericardial or pleural effusion.  No gross mediastinal or hilar adenopathy.  Tiny hiatal hernia.  IMPRESSION:  1.  Moderate degradation secondary to motion and patient body habitus. 2.  Given this factor, no acute process or evidence of metastatic disease within the chest.  CT ABDOMEN AND PELVIS  Findings:  Moderate to marked degradation involving the abdomen. Nondiagnostic evaluation of the pelvis.  Possible hyperenhancing nodule in the hepatic dome 1.7 cm on image 34/series 2.  Grossly normal spleen, distal stomach.  Pancreas not well evaluated.  Gallbladder not evaluated.  Grossly normal adrenal glands and kidneys.  No gross retroperitoneal abdominal adenopathy.  A moderate amount of abdominal ascites.  No gross bowel obstruction.    Pelvic adenopathy cannot be evaluated.  Moderate volume pelvic free fluid.  No gross osseous abnormality.  IMPRESSION:  1.  Moderate to markedly limited evaluation of the abdomen secondary patient body habitus.  Nondiagnostic evaluation of the pelvis. 2.  Moderate volume abdominal pelvic ascites.  No gross retroperitoneal abdominal adenopathy. 3.  Possible hepatic dome nodule, indeterminate.  Not readily apparent on the prior of 2010.  As the patient is not a candidate for MRI, this could be reevaluated on follow-up CTs.   Original Report Authenticated By: Jeronimo Greaves, M.D.   US Paracentesis  10/28/2012   *RADIOLOGY REPORT*  Clinical Data: Recurrent uterine cancer, abdominal distension and discomfort  secondary to ascites.  ULTRASOUND GUIDED PARACENTESIS  Comparison:  Previous paracentesis  An ultrasound guided paracentesis was thoroughly discussed with the patient and questions answered.  The benefits, risks, alternatives and complications were also discussed.  The patient understands and wishes to proceed with the procedure.  Written consent was obtained.  Ultrasound was performed to localize and mark an adequate pocket of fluid in the left upper quadrant of the abdomen.  The area was then prepped and draped in the normal sterile fashion.  1% Lidocaine was used for local anesthesia.  Under ultrasound guidance a 19 gauge Yueh catheter was introduced.  Paracentesis was performed.  The catheter was removed and a dressing applied.  Complications:  None  Findings:  A total of approximately 10 liters of dark amber-colored fluid was removed.  A fluid sample was not sent for laboratory analysis.  IMPRESSION: Successful ultrasound guided paracentesis yielding 10 liters of ascites.  Read by Brayton El PA-C   Original Report Authenticated By: Richarda Overlie, M.D.   US Paracentesis  10/14/2012   *RADIOLOGY REPORT*  Clinical Data: Recurrent uterine cancer, abdominal distension and discomfort secondary to ascites.  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 Patient will be treated with Taxol and carboplatinum palliatively. Port a cath was placed in the OR, and patient will proceed with Taxol Carboplatin beginning 11/04/12.  She is currently cycle 3 day 8.  #2 Ascites: previously underwent paracenteses 1-2 times per week, she hasn't required one in about one month.     PLAN:   #1  Proceed with cycle 4 of taxol/carboplatin  #2 she will be seen back in 1 week for follow up  All questions were answered. The patient knows to call the clinic with any problems, questions or concerns. We can certainly see the patient much  sooner if necessary.  I spent 25 minutes counseling the patient face to  face. The total time spent in the appointment was 30 minutes.  Drue Second, MD Medical/Oncology [facility] [phone] (beeper) [phone] (Office)

## 2013-01-13 ENCOUNTER — Ambulatory Visit (HOSPITAL_BASED_OUTPATIENT_CLINIC_OR_DEPARTMENT_OTHER): Payer: BC Managed Care – PPO | Admitting: Adult Health

## 2013-01-13 ENCOUNTER — Other Ambulatory Visit: Payer: Self-pay | Admitting: Adult Health

## 2013-01-13 ENCOUNTER — Ambulatory Visit: Payer: BC Managed Care – PPO

## 2013-01-13 ENCOUNTER — Encounter: Payer: Self-pay | Admitting: Adult Health

## 2013-01-13 ENCOUNTER — Other Ambulatory Visit (HOSPITAL_BASED_OUTPATIENT_CLINIC_OR_DEPARTMENT_OTHER): Payer: BC Managed Care – PPO

## 2013-01-13 VITALS — BP 124/84 | HR 90 | Temp 97.9°F | Resp 20 | Ht 71.0 in | Wt >= 6400 oz

## 2013-01-13 DIAGNOSIS — C541 Malignant neoplasm of endometrium: Secondary | ICD-10-CM

## 2013-01-13 DIAGNOSIS — R110 Nausea: Secondary | ICD-10-CM

## 2013-01-13 DIAGNOSIS — R197 Diarrhea, unspecified: Secondary | ICD-10-CM

## 2013-01-13 DIAGNOSIS — R188 Other ascites: Secondary | ICD-10-CM

## 2013-01-13 DIAGNOSIS — C549 Malignant neoplasm of corpus uteri, unspecified: Secondary | ICD-10-CM

## 2013-01-13 DIAGNOSIS — E860 Dehydration: Secondary | ICD-10-CM

## 2013-01-13 DIAGNOSIS — C786 Secondary malignant neoplasm of retroperitoneum and peritoneum: Secondary | ICD-10-CM

## 2013-01-13 LAB — CBC WITH DIFFERENTIAL/PLATELET
BASO%: 0 % (ref 0.0–2.0)
EOS%: 0.3 % (ref 0.0–7.0)
MCH: 29.8 pg (ref 25.1–34.0)
MCHC: 32.9 g/dL (ref 31.5–36.0)
MONO%: 10.7 % (ref 0.0–14.0)
RBC: 3.76 10*6/uL (ref 3.70–5.45)
RDW: 21.5 % — ABNORMAL HIGH (ref 11.2–14.5)
lymph#: 0.7 10*3/uL — ABNORMAL LOW (ref 0.9–3.3)

## 2013-01-13 LAB — COMPREHENSIVE METABOLIC PANEL (CC13)
ALT: 111 U/L — ABNORMAL HIGH (ref 0–55)
AST: 29 U/L (ref 5–34)
Alkaline Phosphatase: 88 U/L (ref 40–150)
BUN: 16.6 mg/dL (ref 7.0–26.0)
Chloride: 96 mEq/L — ABNORMAL LOW (ref 98–109)
Creatinine: 0.7 mg/dL (ref 0.6–1.1)
Sodium: 135 mEq/L — ABNORMAL LOW (ref 136–145)
Total Bilirubin: 0.75 mg/dL (ref 0.20–1.20)

## 2013-01-13 MED ORDER — SODIUM CHLORIDE 0.9 % IV SOLN
Freq: Once | INTRAVENOUS | Status: AC
  Administered 2013-01-13: 14:00:00 via INTRAVENOUS

## 2013-01-13 MED ORDER — DEXAMETHASONE 4 MG PO TABS: 8.0000 mg | ORAL_TABLET | Freq: Two times a day (BID) | ORAL | Status: DC

## 2013-01-13 MED ORDER — PALONOSETRON HCL INJECTION 0.25 MG/5ML
0.2500 mg | Freq: Once | INTRAVENOUS | Status: AC
  Administered 2013-01-13: 0.25 mg via INTRAVENOUS

## 2013-01-13 MED ORDER — LORAZEPAM 0.5 MG PO TABS: 0.5000 mg | ORAL_TABLET | Freq: Four times a day (QID) | ORAL | Status: DC | PRN

## 2013-01-13 MED ORDER — PALONOSETRON HCL INJECTION 0.25 MG/5ML
INTRAVENOUS | Status: AC
  Filled 2013-01-13: qty 5

## 2013-01-13 NOTE — Progress Notes (Addendum)
OFFICE PROGRESS NOTE  CC  Catherine Rua, MD [address] [place] [address] Dr. Laurette Schimke  DIAGNOSIS: 47 year old female with prior history of stage I a uterine cancer diagnosed in 2010. Now with recurrent disease diagnosed in May 2014   PRIOR THERAPY:  #1Patient subsequently developed excessive uterine bleeding. Bad tablet to dilatation and curettage with hysteroscopy. 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 Due to morbid obesity Mirena IUD was placed along with the addition of Aygestin. Patient had a dramatic directed weight loss with nutritional support she went from 523 pounds to 359 pounds in July of 2011. Patient continued to do well and is 12/27/2009 she at that time underwent a robotic-assisted laparoscopic hysterectomy, bilateral salpingo-oophorectomy, and mini laparotomy through the umbilical port with morcellation of uterus within about the delivery of the uterus. The final pathology did reveal an endometrial adenocarcinoma grade 2 with invasion limited to 1 mm of the myometrium  #2continued to do well until June 2014 when she developed right upper quadrant pain. It was presumed to be cholelithiasis. 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 Omental biopsies were obtained. The pathology was consistent with metastatic adenocarcinoma in the presumption of recurrent endometrial carcinoma was performed made. She was seen by Dr. Laurette Schimke on 10/07/2012. She was recommended that she undergo chemotherapy consisting of Taxol and carboplatinum and started this on 11/04/12.  #3 abdominal ascites: Multiple paracenteses  CURRENT THERAPY: Taxol/Carboplatinum cycle 4 day 8  INTERVAL HISTORY: [name] 47 y.o. female returns for followup visit today of her endometrial cancer. She received  Taxol/Carboplatinum last week.  She started feeling poorly on Saturday evening.  She developed diarrhea and it was so severe that evening that she used it all in her room, all over herself and couldn't control it.  She has been dizzy ever since.  She has not had any further diarrhea, or bowel movement since.  She is nauseated, but otherwise denies fevers, chills, vomiting, constipation.  She has mild intermittent numbness in the tips of her toes.  No motor deficits noted.    MEDICAL HISTORY: Past Medical History  Diagnosis Date  . Uterine cancer   . Obesity   . DVT (deep venous thrombosis) 2009  . PONV (postoperative nausea and vomiting)   . Swelling     BOTH LEGS  . Rash     LOWER ABD  . Gallstones   . Ascites     ALLERGIES:  has No Known Allergies.  MEDICATIONS:  Current Outpatient Prescriptions  Medication Sig Dispense Refill  . Ascorbic Acid (VITAMIN C) 1000 MG tablet Take 1,000 mg by mouth daily.      Marland Kitchen CARAFATE 1 GM/10ML suspension TAKE 10 MILLILITERS BY MOUTH 4 TIMES A DAY  420 mL  0  . Cyanocobalamin (VITAMIN B-12 PO) Take 2,500 mg by mouth daily.       . ferrous sulfate 325 (65 FE) MG tablet TAKE 1 TABLET (325 MG TOTAL) BY MOUTH 3 (THREE) TIMES DAILY WITH MEALS.  90 tablet  0  . folic acid (FOLVITE) 400 MCG tablet Take 400 mcg by mouth daily.        Marland Kitchen lidocaine-prilocaine (EMLA) cream Apply topically as needed. Apply to port-a-cath site 1-2 hours prior to treatment.  30 g  2  . loratadine (CLARITIN) 10 MG tablet Take 10 mg by mouth daily.      Marland Kitchen  LORazepam (ATIVAN) 0.5 MG tablet Take 1 tablet (0.5 mg total) by mouth every 6 (six) hours as needed (Nausea or vomiting).  30 tablet  0  . magnesium gluconate (MAGONATE) 500 MG tablet Take 500 mg by mouth daily.      . Multiple Vitamins-Minerals (MULTIVITAMIN WITH MINERALS) tablet Take 1 tablet by mouth daily.       . ondansetron (ZOFRAN) 8 MG tablet Take 1 tablet (8 mg total) by mouth 2 (two) times daily. Take two times a day  starting the day after chemo for 3 days. Then take two times a day as needed for nausea or vomiting.  30 tablet  1  . oxyCODONE-acetaminophen (PERCOCET) 10-325 MG per tablet Take 1 tablet by mouth every 4 (four) hours as needed for pain.  90 tablet  0  . pantoprazole (PROTONIX) 40 MG tablet Take 1 tablet (40 mg total) by mouth daily.  30 tablet  6  . pegfilgrastim (NEULASTA) 6 MG/0.6ML injection Inject 6 mg into the skin every 21 ( twenty-one) days.      Marland Kitchen PRESCRIPTION MEDICATION every 21 ( twenty-one) days. CHEMOTHERAPY REGIMEN      . prochlorperazine (COMPAZINE) 10 MG tablet Take 1 tablet (10 mg total) by mouth every 6 (six) hours as needed (Nausea or vomiting).  30 tablet  1  . triamterene-hydrochlorothiazide (MAXZIDE-25) 37.5-25 MG per tablet Take 1 tablet by mouth every morning.       Marland Kitchen UNABLE TO FIND Super heavy duty wheelchair with anit-tippers and leg rests with heel loops  Diagnosis: Endometrial cancer 182.0; Ascites 789.59; severe fatigue 780.79  1 Units  0  . dexamethasone (DECADRON) 4 MG tablet Take 2 tablets (8 mg total) by mouth 2 (two) times daily with a meal. Take two times a day starting the day after chemotherapy for 3 days.  30 tablet  1  . potassium chloride (K-DUR) 10 MEQ tablet Take 1 tablet (10 mEq total) by mouth 2 (two) times daily.  14 tablet  0  . promethazine (PHENERGAN) 12.5 MG tablet Take 1 tablet (12.5 mg total) by mouth every 6 (six) hours as needed for nausea.  30 tablet  0   No current facility-administered medications for this visit.    SURGICAL HISTORY:  Past Surgical History  Procedure Laterality Date  . Ankle surgery  1950  . Wisdom tooth extraction    . Tonsillectomy and adenoidectomy    . Hysteroscopy w/d&c    . Abdominal hysterectomy  12/2009    [facility], [facility]  . Laparoscopy N/A 10/02/2012    Procedure: LAPAROSCOPY DIAGNOSTIC, perocentisis, omental biopsy;  Surgeon: Wilmon Arms. Tsuei, MD;  Location: [facility];  Service: General;  Laterality: N/A;  removed a  total of 15,000 of acities  . Paracentesis      REVIEW OF SYSTEMS:  A 10 point review of systems was conducted and is otherwise negative except for what is noted above.     PHYSICAL EXAMINATION: Blood pressure 124/84, pulse 90, temperature 97.9 F (36.6 C), temperature source Oral, resp. rate 20, height 5\' 11"  (1.803 m), weight 417 lb 4.8 oz (189.286 kg). Body mass index is 58.23 kg/(m^2). General: Patient is a morbidly obese well appearing female in no acute distress HEENT: PERRLA, sclerae anicteric no conjunctival pallor, MMM Neck: supple, no palpable adenopathy Lungs: clear to auscultation bilaterally, no wheezes, rhonchi, or rales Cardiovascular: regular rate rhythm, S1, S2, no murmurs, rubs or gallops Abdomen: Soft, non-tender, non-distended, normoactive bowel sounds, no HSM--limited exam due to morbid  obesity Extremities: warm and well perfused, no clubbing, cyanosis, or edema Skin: No rashes or lesions Neuro: Non-focal ECOG PERFORMANCE STATUS: 2 - Symptomatic, <50% confined to bed   LABORATORY DATA: Lab Results  Component Value Date   WBC 3.4* 01/13/2013   HGB 11.2* 01/13/2013   HCT 34.0* 01/13/2013   MCV 90.4 01/13/2013   PLT 166 01/13/2013      Chemistry      Component Value Date/Time   NA 135* 01/13/2013 1252   NA 135 10/31/2012 0655   K 3.1* 01/13/2013 1252   K 3.5 10/31/2012 0655   CL 97 10/31/2012 0655   CL 98 10/07/2012 0923   CO2 24 01/13/2013 1252   CO2 29 10/31/2012 0655   BUN 16.6 01/13/2013 1252   BUN 8 10/31/2012 0655   CREATININE 0.7 01/13/2013 1252   CREATININE 0.78 10/31/2012 0655      Component Value Date/Time   CALCIUM 9.2 01/13/2013 1252   CALCIUM 8.3* 10/31/2012 0655   ALKPHOS 88 01/13/2013 1252   ALKPHOS 55 09/09/2012 1315   AST 29 01/13/2013 1252   AST 25 09/09/2012 1315   ALT 111* 01/13/2013 1252   ALT 17 09/09/2012 1315   BILITOT 0.75 01/13/2013 1252   BILITOT 0.4 09/09/2012 1315       RADIOGRAPHIC STUDIES:  Ct Chest W Contrast  10/21/2012    *RADIOLOGY REPORT*  Clinical Data:  Recurrent endometrial cancer.  CT CHEST, ABDOMEN AND PELVIS WITH CONTRAST  Technique: Contiguous axial images of the chest abdomen and pelvis were obtained after IV contrast administration.  Contrast: 80 ml Omnipaque-300  Comparison: Abdominal pelvic CT of 02/11/2009.  No prior chest CT. Plain film chest of 12/23/2009 is reviewed.  CT CHEST  Findings: Lung windows demonstrate moderate degradation secondary to motion and patient body habitus. No airspace opacities.  Soft tissue windows demonstrate no definite supraclavicular adenopathy.  Mild cardiomegaly, without pericardial or pleural effusion.  No gross mediastinal or hilar adenopathy.  Tiny hiatal hernia.  IMPRESSION:  1.  Moderate degradation secondary to motion and patient body habitus. 2.  Given this factor, no acute process or evidence of metastatic disease within the chest.  CT ABDOMEN AND PELVIS  Findings:  Moderate to marked degradation involving the abdomen. Nondiagnostic evaluation of the pelvis.  Possible hyperenhancing nodule in the hepatic dome 1.7 cm on image 34/series 2.  Grossly normal spleen, distal stomach.  Pancreas not well evaluated.  Gallbladder not evaluated.  Grossly normal adrenal glands and kidneys.  No gross retroperitoneal abdominal adenopathy.  A moderate amount of abdominal ascites.  No gross bowel obstruction.    Pelvic adenopathy cannot be evaluated.  Moderate volume pelvic free fluid.  No gross osseous abnormality.  IMPRESSION:  1.  Moderate to markedly limited evaluation of the abdomen secondary patient body habitus.  Nondiagnostic evaluation of the pelvis. 2.  Moderate volume abdominal pelvic ascites.  No gross retroperitoneal abdominal adenopathy. 3.  Possible hepatic dome nodule, indeterminate.  Not readily apparent on the prior of 2010.  As the patient is not a candidate for MRI, this could be reevaluated on follow-up CTs.   Original Report Authenticated By: Jeronimo Greaves, M.D.   Ct Abdomen  Pelvis W Contrast  10/21/2012   *RADIOLOGY REPORT*  Clinical Data:  Recurrent endometrial cancer.  CT CHEST, ABDOMEN AND PELVIS WITH CONTRAST  Technique: Contiguous axial images of the chest abdomen and pelvis were obtained after IV contrast administration.  Contrast: 80 ml Omnipaque-300  Comparison: Abdominal pelvic CT of 02/11/2009.  No prior chest CT. Plain film chest of 12/23/2009 is reviewed.  CT CHEST  Findings: Lung windows demonstrate moderate degradation secondary to motion and patient body habitus. No airspace opacities.  Soft tissue windows demonstrate no definite supraclavicular adenopathy.  Mild cardiomegaly, without pericardial or pleural effusion.  No gross mediastinal or hilar adenopathy.  Tiny hiatal hernia.  IMPRESSION:  1.  Moderate degradation secondary to motion and patient body habitus. 2.  Given this factor, no acute process or evidence of metastatic disease within the chest.  CT ABDOMEN AND PELVIS  Findings:  Moderate to marked degradation involving the abdomen. Nondiagnostic evaluation of the pelvis.  Possible hyperenhancing nodule in the hepatic dome 1.7 cm on image 34/series 2.  Grossly normal spleen, distal stomach.  Pancreas not well evaluated.  Gallbladder not evaluated.  Grossly normal adrenal glands and kidneys.  No gross retroperitoneal abdominal adenopathy.  A moderate amount of abdominal ascites.  No gross bowel obstruction.    Pelvic adenopathy cannot be evaluated.  Moderate volume pelvic free fluid.  No gross osseous abnormality.  IMPRESSION:  1.  Moderate to markedly limited evaluation of the abdomen secondary patient body habitus.  Nondiagnostic evaluation of the pelvis. 2.  Moderate volume abdominal pelvic ascites.  No gross retroperitoneal abdominal adenopathy. 3.  Possible hepatic dome nodule, indeterminate.  Not readily apparent on the prior of 2010.  As the patient is not a candidate for MRI, this could be reevaluated on follow-up CTs.   Original Report Authenticated By:  Jeronimo Greaves, M.D.   US Paracentesis  10/28/2012   *RADIOLOGY REPORT*  Clinical Data: Recurrent uterine cancer, abdominal distension and discomfort secondary to ascites.  ULTRASOUND GUIDED PARACENTESIS  Comparison:  Previous paracentesis  An ultrasound guided paracentesis was thoroughly discussed with the patient and questions answered.  The benefits, risks, alternatives and complications were also discussed.  The patient understands and wishes to proceed with the procedure.  Written consent was obtained.  Ultrasound was performed to localize and mark an adequate pocket of fluid in the left upper quadrant of the abdomen.  The area was then prepped and draped in the normal sterile fashion.  1% Lidocaine was used for local anesthesia.  Under ultrasound guidance a 19 gauge Yueh catheter was introduced.  Paracentesis was performed.  The catheter was removed and a dressing applied.  Complications:  None  Findings:  A total of approximately 10 liters of dark amber-colored fluid was removed.  A fluid sample was not sent for laboratory analysis.  IMPRESSION: Successful ultrasound guided paracentesis yielding 10 liters of ascites.  Read by Brayton El PA-C   Original Report Authenticated By: Richarda Overlie, M.D.   US Paracentesis  10/14/2012   *RADIOLOGY REPORT*  Clinical Data: Recurrent uterine cancer, abdominal distension and discomfort secondary to ascites.  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 Patient will be treated with Taxol and carboplatinum palliatively. Port a cath was placed in the OR, and patient will proceed with Taxol Carboplatin beginning 11/04/12.  She is currently cycle 4 day 8.  #2 Ascites: previously underwent paracenteses 1-2 times per week, she hasn't required one in about one month.     PLAN:   #1 Patient is feeling poorly following chemotherapy.  Her labs are stable, I reviewed them with her in  detail.  Due to the fluid loss from the diarrheal episode on Saturday evening, we will give her IV fluids, 1 liter NS.  I  will also give her Aloxi.  I recommended she increase her IV fluids.    #2 She will return in 2 weeks for labs, evaluation, and cycle 5 of chemotherapy.    All questions were answered. The patient knows to call the clinic with any problems, questions or concerns. We can certainly see the patient much sooner if necessary.  I spent 25 minutes counseling the patient face to face. The total time spent in the appointment was 30 minutes.  Cherie Ouch Lyn Hollingshead, NP Medical Oncology [facility] Phone: [phone]  ATTENDING'S ATTESTATION:  I personally reviewed patient's chart, examined patient myself, formulated the treatment plan as followed.    Overall patient is doing okay with a and you and look stable and she is is dehydrated and she is Aloxi. Patient was seen by Dr. Nelly Rout and she has i recommended that he continue chemotherapy to complete out these 8 cycles of Taxol carboplatinum because of her size. I explained this to the patient. And she is willing to proceed with extended treatment.  Drue Second, MD Medical/Oncology [facility] [phone] (beeper) [phone] (Office)  01/21/2013, 1:09 PM

## 2013-01-13 NOTE — Patient Instructions (Addendum)
Super B complex daily.  Doing well after chemotherapy.  We will see you back in 2 weeks.     Dehydration, Adult Dehydration is when you lose more fluids from the body than you take in. Vital organs like the kidneys, brain, and heart cannot function without a proper amount of fluids and salt. Any loss of fluids from the body can cause dehydration.  CAUSES   Vomiting.  Diarrhea.  Excessive sweating.  Excessive urine output.  Fever. SYMPTOMS  Mild dehydration  Thirst.  Dry lips.  Slightly dry mouth. Moderate dehydration  Very dry mouth.  Sunken eyes.  Skin does not bounce back quickly when lightly pinched and released.  Dark urine and decreased urine production.  Decreased tear production.  Headache. Severe dehydration  Very dry mouth.  Extreme thirst.  Rapid, weak pulse (more than 100 beats per minute at rest).  Cold hands and feet.  Not able to sweat in spite of heat and temperature.  Rapid breathing.  Blue lips.  Confusion and lethargy.  Difficulty being awakened.  Minimal urine production.  No tears. DIAGNOSIS  Your caregiver will diagnose dehydration based on your symptoms and your exam. Blood and urine tests will help confirm the diagnosis. The diagnostic evaluation should also identify the cause of dehydration. TREATMENT  Treatment of mild or moderate dehydration can often be done at home by increasing the amount of fluids that you drink. It is best to drink small amounts of fluid more often. Drinking too much at one time can make vomiting worse. Refer to the home care instructions below. Severe dehydration needs to be treated at the hospital where you will probably be given intravenous (IV) fluids that contain water and electrolytes. HOME CARE INSTRUCTIONS   Ask your caregiver about specific rehydration instructions.  Drink enough fluids to keep your urine clear or pale yellow.  Drink small amounts frequently if you have nausea and  vomiting.  Eat as you normally do.  Avoid:  Foods or drinks high in sugar.  Carbonated drinks.  Juice.  Extremely hot or cold fluids.  Drinks with caffeine.  Fatty, greasy foods.  Alcohol.  Tobacco.  Overeating.  Gelatin desserts.  Wash your hands well to avoid spreading bacteria and viruses.  Only take over-the-counter or prescription medicines for pain, discomfort, or fever as directed by your caregiver.  Ask your caregiver if you should continue all prescribed and over-the-counter medicines.  Keep all follow-up appointments with your caregiver. SEEK MEDICAL CARE IF:  You have abdominal pain and it increases or stays in one area (localizes).  You have a rash, stiff neck, or severe headache.  You are irritable, sleepy, or difficult to awaken.  You are weak, dizzy, or extremely thirsty. SEEK IMMEDIATE MEDICAL CARE IF:   You are unable to keep fluids down or you get worse despite treatment.  You have frequent episodes of vomiting or diarrhea.  You have blood or green matter (bile) in your vomit.  You have blood in your stool or your stool looks black and tarry.  You have not urinated in 6 to 8 hours, or you have only urinated a small amount of very dark urine.  You have a fever.  You faint. MAKE SURE YOU:   Understand these instructions.  Will watch your condition.  Will get help right away if you are not doing well or get worse. Document Released: 04/09/2005 Document Revised: 07/02/2011 Document Reviewed: 11/27/2010 [facility] Patient Information 2014 [place], [place].

## 2013-01-14 ENCOUNTER — Other Ambulatory Visit: Payer: Self-pay | Admitting: Medical Oncology

## 2013-01-14 DIAGNOSIS — R110 Nausea: Secondary | ICD-10-CM

## 2013-01-14 MED ORDER — PROMETHAZINE HCL 12.5 MG PO TABS: 12.5000 mg | ORAL_TABLET | Freq: Four times a day (QID) | ORAL | Status: DC | PRN

## 2013-01-14 NOTE — Telephone Encounter (Signed)
Patient called stating refill for phenergan was declined by her pharmacy. Call to pharmacy, they state it was declined by our office. Reviewed wit NP, patient ok to have phenergan, prescription sent to patient's listed pharmacy for 12.5mg   Phenergan every 6 hours as needed. Patient informed prescription sent. Expressed thanks, no further questions at this time.

## 2013-01-16 ENCOUNTER — Other Ambulatory Visit: Payer: Self-pay | Admitting: Adult Health

## 2013-01-19 ENCOUNTER — Ambulatory Visit: Payer: BC Managed Care – PPO | Attending: Gynecologic Oncology | Admitting: Physical Therapy

## 2013-01-19 DIAGNOSIS — M6281 Muscle weakness (generalized): Secondary | ICD-10-CM | POA: Insufficient documentation

## 2013-01-19 DIAGNOSIS — Z79899 Other long term (current) drug therapy: Secondary | ICD-10-CM | POA: Insufficient documentation

## 2013-01-19 DIAGNOSIS — R5381 Other malaise: Secondary | ICD-10-CM | POA: Insufficient documentation

## 2013-01-19 DIAGNOSIS — IMO0001 Reserved for inherently not codable concepts without codable children: Secondary | ICD-10-CM | POA: Insufficient documentation

## 2013-01-20 ENCOUNTER — Other Ambulatory Visit: Payer: Self-pay | Admitting: *Deleted

## 2013-01-20 MED ORDER — UNABLE TO FIND: Status: DC

## 2013-01-21 ENCOUNTER — Ambulatory Visit: Payer: BC Managed Care – PPO

## 2013-01-21 ENCOUNTER — Ambulatory Visit: Payer: BC Managed Care – PPO | Attending: Gynecologic Oncology | Admitting: Physical Therapy

## 2013-01-21 DIAGNOSIS — IMO0001 Reserved for inherently not codable concepts without codable children: Secondary | ICD-10-CM | POA: Insufficient documentation

## 2013-01-21 DIAGNOSIS — M6281 Muscle weakness (generalized): Secondary | ICD-10-CM | POA: Insufficient documentation

## 2013-01-21 DIAGNOSIS — Z79899 Other long term (current) drug therapy: Secondary | ICD-10-CM | POA: Insufficient documentation

## 2013-01-21 DIAGNOSIS — R5381 Other malaise: Secondary | ICD-10-CM | POA: Insufficient documentation

## 2013-01-26 ENCOUNTER — Other Ambulatory Visit: Payer: Self-pay | Admitting: Adult Health

## 2013-01-26 ENCOUNTER — Ambulatory Visit: Payer: BC Managed Care – PPO | Admitting: Physical Therapy

## 2013-01-27 ENCOUNTER — Other Ambulatory Visit: Payer: BC Managed Care – PPO | Admitting: Lab

## 2013-01-27 ENCOUNTER — Ambulatory Visit (HOSPITAL_BASED_OUTPATIENT_CLINIC_OR_DEPARTMENT_OTHER): Payer: BC Managed Care – PPO

## 2013-01-27 ENCOUNTER — Encounter: Payer: Self-pay | Admitting: Adult Health

## 2013-01-27 ENCOUNTER — Ambulatory Visit (HOSPITAL_BASED_OUTPATIENT_CLINIC_OR_DEPARTMENT_OTHER): Payer: BC Managed Care – PPO | Admitting: Adult Health

## 2013-01-27 VITALS — BP 104/73 | HR 81 | Temp 97.7°F | Resp 20 | Ht 71.0 in | Wt >= 6400 oz

## 2013-01-27 DIAGNOSIS — C549 Malignant neoplasm of corpus uteri, unspecified: Secondary | ICD-10-CM

## 2013-01-27 DIAGNOSIS — C786 Secondary malignant neoplasm of retroperitoneum and peritoneum: Secondary | ICD-10-CM

## 2013-01-27 DIAGNOSIS — C541 Malignant neoplasm of endometrium: Secondary | ICD-10-CM

## 2013-01-27 DIAGNOSIS — Z5111 Encounter for antineoplastic chemotherapy: Secondary | ICD-10-CM

## 2013-01-27 LAB — COMPREHENSIVE METABOLIC PANEL (CC13)
AST: 19 U/L (ref 5–34)
Albumin: 3.4 g/dL — ABNORMAL LOW (ref 3.5–5.0)
Anion Gap: 12 mEq/L — ABNORMAL HIGH (ref 3–11)
BUN: 18.1 mg/dL (ref 7.0–26.0)
CO2: 30 mEq/L — ABNORMAL HIGH (ref 22–29)
Calcium: 9.7 mg/dL (ref 8.4–10.4)
Chloride: 101 mEq/L (ref 98–109)
Glucose: 87 mg/dl (ref 70–140)
Potassium: 3.4 mEq/L — ABNORMAL LOW (ref 3.5–5.1)

## 2013-01-27 LAB — CBC WITH DIFFERENTIAL/PLATELET
Basophils Absolute: 0 10*3/uL (ref 0.0–0.1)
Eosinophils Absolute: 0 10*3/uL (ref 0.0–0.5)
HCT: 36.8 % (ref 34.8–46.6)
HGB: 11.9 g/dL (ref 11.6–15.9)
MCH: 31.7 pg (ref 25.1–34.0)
MONO#: 0.8 10*3/uL (ref 0.1–0.9)
NEUT#: 7.3 10*3/uL — ABNORMAL HIGH (ref 1.5–6.5)
NEUT%: 73.3 % (ref 38.4–76.8)
WBC: 9.9 10*3/uL (ref 3.9–10.3)
lymph#: 1.8 10*3/uL (ref 0.9–3.3)

## 2013-01-27 LAB — URINALYSIS, MICROSCOPIC - CHCC
Bilirubin (Urine): NEGATIVE
Blood: NEGATIVE
Glucose: NEGATIVE mg/dL
Ketones: NEGATIVE mg/dL
Protein: <30 mg/dL
Specific Gravity, Urine: 1.01 (ref 1.003–1.035)
pH: 8.5 (ref 4.6–8.0)

## 2013-01-27 MED ORDER — SODIUM CHLORIDE 0.9 % IV SOLN
900.0000 mg | Freq: Once | INTRAVENOUS | Status: AC
  Administered 2013-01-27: 900 mg via INTRAVENOUS
  Filled 2013-01-27: qty 90

## 2013-01-27 MED ORDER — PALONOSETRON HCL INJECTION 0.25 MG/5ML
0.2500 mg | Freq: Once | INTRAVENOUS | Status: AC
  Administered 2013-01-27: 0.25 mg via INTRAVENOUS

## 2013-01-27 MED ORDER — SODIUM CHLORIDE 0.9 % IV SOLN
Freq: Once | INTRAVENOUS | Status: AC
  Administered 2013-01-27: 10:00:00 via INTRAVENOUS

## 2013-01-27 MED ORDER — OXYCODONE-ACETAMINOPHEN 10-325 MG PO TABS: 1.0000 | ORAL_TABLET | ORAL | Status: DC | PRN

## 2013-01-27 MED ORDER — FAMOTIDINE IN NACL 20-0.9 MG/50ML-% IV SOLN
INTRAVENOUS | Status: AC
  Filled 2013-01-27: qty 50

## 2013-01-27 MED ORDER — DIPHENHYDRAMINE HCL 50 MG/ML IJ SOLN
50.0000 mg | Freq: Once | INTRAMUSCULAR | Status: AC
  Administered 2013-01-27: 50 mg via INTRAVENOUS

## 2013-01-27 MED ORDER — PACLITAXEL CHEMO INJECTION 300 MG/50ML
175.0000 mg/m2 | Freq: Once | INTRAVENOUS | Status: AC
  Administered 2013-01-27: 618 mg via INTRAVENOUS
  Filled 2013-01-27: qty 103

## 2013-01-27 MED ORDER — FOSAPREPITANT DIMEGLUMINE INJECTION 150 MG
150.0000 mg | Freq: Once | INTRAVENOUS | Status: AC
  Administered 2013-01-27: 150 mg via INTRAVENOUS
  Filled 2013-01-27: qty 5

## 2013-01-27 MED ORDER — DEXAMETHASONE SODIUM PHOSPHATE 20 MG/5ML IJ SOLN
INTRAMUSCULAR | Status: AC
  Filled 2013-01-27: qty 5

## 2013-01-27 MED ORDER — FAMOTIDINE IN NACL 20-0.9 MG/50ML-% IV SOLN
20.0000 mg | Freq: Once | INTRAVENOUS | Status: AC
  Administered 2013-01-27: 20 mg via INTRAVENOUS

## 2013-01-27 MED ORDER — LORAZEPAM 0.5 MG PO TABS: 0.5000 mg | ORAL_TABLET | Freq: Four times a day (QID) | ORAL | Status: DC | PRN

## 2013-01-27 MED ORDER — HEPARIN SOD (PORK) LOCK FLUSH 100 UNIT/ML IV SOLN
500.0000 [IU] | Freq: Once | INTRAVENOUS | Status: AC | PRN
  Administered 2013-01-27: 500 [IU]
  Filled 2013-01-27: qty 5

## 2013-01-27 MED ORDER — DIPHENHYDRAMINE HCL 50 MG/ML IJ SOLN
INTRAMUSCULAR | Status: AC
  Filled 2013-01-27: qty 1

## 2013-01-27 MED ORDER — DEXAMETHASONE SODIUM PHOSPHATE 20 MG/5ML IJ SOLN
12.0000 mg | Freq: Once | INTRAMUSCULAR | Status: AC
  Administered 2013-01-27: 12 mg via INTRAVENOUS

## 2013-01-27 MED ORDER — PALONOSETRON HCL INJECTION 0.25 MG/5ML
INTRAVENOUS | Status: AC
  Filled 2013-01-27: qty 5

## 2013-01-27 MED ORDER — SODIUM CHLORIDE 0.9 % IJ SOLN
10.0000 mL | INTRAMUSCULAR | Status: DC | PRN
  Administered 2013-01-27: 10 mL
  Filled 2013-01-27: qty 10

## 2013-01-27 NOTE — Patient Instructions (Addendum)
Doing well.  Proceed with chemotherapy.  Should your back pain worsen, you will need to come in.  Please call us if you have any questions or concerns.    I will call you with the results of the urinalysis.

## 2013-01-27 NOTE — Progress Notes (Signed)
OFFICE PROGRESS NOTE  CC  Catherine Rua, MD [address] [place] [address] Dr. Laurette Schimke  DIAGNOSIS: 47 year old female with prior history of stage I a uterine cancer diagnosed in 2010. Now with recurrent disease diagnosed in May 2014   PRIOR THERAPY:  #1Patient subsequently developed excessive uterine bleeding. Bad tablet to dilatation and curettage with hysteroscopy. 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 Due to morbid obesity Mirena IUD was placed along with the addition of Aygestin. Patient had a dramatic directed weight loss with nutritional support she went from 523 pounds to 359 pounds in July of 2011. Patient continued to do well and is 12/27/2009 she at that time underwent a robotic-assisted laparoscopic hysterectomy, bilateral salpingo-oophorectomy, and mini laparotomy through the umbilical port with morcellation of uterus within about the delivery of the uterus. The final pathology did reveal an endometrial adenocarcinoma grade 2 with invasion limited to 1 mm of the myometrium  #2continued to do well until June 2014 when she developed right upper quadrant pain. It was presumed to be cholelithiasis. 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 Omental biopsies were obtained. The pathology was consistent with metastatic adenocarcinoma in the presumption of recurrent endometrial carcinoma was performed made. She was seen by Dr. Laurette Schimke on 10/07/2012. She was recommended that she undergo chemotherapy consisting of Taxol and carboplatinum and started this on 11/04/12.  #3 abdominal ascites: Multiple paracenteses  CURRENT THERAPY: Taxol/Carboplatinum cycle 5 day 1  INTERVAL HISTORY: [name] 47 y.o. female returns for followup visit today of her endometrial cancer.  She says she  Called last  week to inform me of some lower back pain that she has been having.  She has had some foul smelling urine this past week and is concerned she may have a urinary tract infection.  She denies any weakness, numbness, or bowel and bladder incontinence, dysuria.  She denies fevers.  She does not want to have an MRI.  She is undergoing physical therapy for strength and mobility.  Otherwise, a 10 point ROs is neg.    MEDICAL HISTORY: Past Medical History  Diagnosis Date  . Uterine cancer   . Obesity   . DVT (deep venous thrombosis) 2009  . PONV (postoperative nausea and vomiting)   . Swelling     BOTH LEGS  . Rash     LOWER ABD  . Gallstones   . Ascites     ALLERGIES:  has No Known Allergies.  MEDICATIONS:  Current Outpatient Prescriptions  Medication Sig Dispense Refill  . Ascorbic Acid (VITAMIN C) 1000 MG tablet Take 1,000 mg by mouth daily.      Marland Kitchen CARAFATE 1 GM/10ML suspension TAKE 10 MILLILITERS BY MOUTH 4 TIMES A DAY  420 mL  0  . Cyanocobalamin (VITAMIN B-12 PO) Take 2,500 mg by mouth daily.       Marland Kitchen dexamethasone (DECADRON) 4 MG tablet Take 2 tablets (8 mg total) by mouth 2 (two) times daily with a meal. Take two times a day starting the day after chemotherapy for 3 days.  30 tablet  1  . ferrous sulfate 325 (65 FE) MG tablet TAKE 1 TABLET (325 MG TOTAL) BY MOUTH 3 (THREE) TIMES DAILY WITH MEALS.  90 tablet  0  . folic acid (FOLVITE) 400 MCG tablet Take 400 mcg by mouth daily.        Marland Kitchen  lidocaine-prilocaine (EMLA) cream Apply topically as needed. Apply to port-a-cath site 1-2 hours prior to treatment.  30 g  2  . loratadine (CLARITIN) 10 MG tablet Take 10 mg by mouth daily.      Marland Kitchen LORazepam (ATIVAN) 0.5 MG tablet Take 1 tablet (0.5 mg total) by mouth every 6 (six) hours as needed (Nausea or vomiting).  30 tablet  0  . magnesium gluconate (MAGONATE) 500 MG tablet Take 500 mg by mouth daily.      . Multiple Vitamins-Minerals (MULTIVITAMIN WITH MINERALS) tablet Take 1 tablet by mouth  daily.       . ondansetron (ZOFRAN) 8 MG tablet Take 1 tablet (8 mg total) by mouth 2 (two) times daily. Take two times a day starting the day after chemo for 3 days. Then take two times a day as needed for nausea or vomiting.  30 tablet  1  . oxyCODONE-acetaminophen (PERCOCET) 10-325 MG per tablet Take 1 tablet by mouth every 4 (four) hours as needed for pain.  90 tablet  0  . pantoprazole (PROTONIX) 40 MG tablet Take 1 tablet (40 mg total) by mouth daily.  30 tablet  6  . potassium chloride (K-DUR) 10 MEQ tablet Take 1 tablet (10 mEq total) by mouth 2 (two) times daily.  14 tablet  0  . PRESCRIPTION MEDICATION every 21 ( twenty-one) days. CHEMOTHERAPY REGIMEN      . prochlorperazine (COMPAZINE) 10 MG tablet Take 1 tablet (10 mg total) by mouth every 6 (six) hours as needed (Nausea or vomiting).  30 tablet  1  . promethazine (PHENERGAN) 12.5 MG tablet Take 1 tablet (12.5 mg total) by mouth every 6 (six) hours as needed for nausea.  30 tablet  0  . triamterene-hydrochlorothiazide (MAXZIDE-25) 37.5-25 MG per tablet Take 1 tablet by mouth every morning.       Marland Kitchen UNABLE TO FIND Super heavy duty wheelchair with anit-tippers and leg rests with heel loops  Diagnosis: Endometrial cancer 182.0; Ascites 789.59; severe fatigue 780.79  1 Units  0  . pegfilgrastim (NEULASTA) 6 MG/0.6ML injection Inject 6 mg into the skin every 21 ( twenty-one) days.       No current facility-administered medications for this visit.    SURGICAL HISTORY:  Past Surgical History  Procedure Laterality Date  . Ankle surgery  1950  . Wisdom tooth extraction    . Tonsillectomy and adenoidectomy    . Hysteroscopy w/d&c    . Abdominal hysterectomy  12/2009    [facility], [facility]  . Laparoscopy N/A 10/02/2012    Procedure: LAPAROSCOPY DIAGNOSTIC, perocentisis, omental biopsy;  Surgeon: Wilmon Arms. Tsuei, MD;  Location: [facility];  Service: General;  Laterality: N/A;  removed a total of 15,000 of acities  . Paracentesis      REVIEW OF SYSTEMS:   A 10 point review of systems was conducted and is otherwise negative except for what is noted above.     PHYSICAL EXAMINATION: Blood pressure 104/73, pulse 81, temperature 97.7 F (36.5 C), temperature source Oral, resp. rate 20, height 5\' 11"  (1.803 m), weight 422 lb 12.8 oz (191.781 kg). Body mass index is 58.99 kg/(m^2). General: Patient is a morbidly obese well appearing female in no acute distress HEENT: PERRLA, sclerae anicteric no conjunctival pallor, MMM Neck: supple, no palpable adenopathy Lungs: clear to auscultation bilaterally, no wheezes, rhonchi, or rales Cardiovascular: regular rate rhythm, S1, S2, no murmurs, rubs or gallops Abdomen: Soft, non-tender, non-distended, normoactive bowel sounds, no HSM--limited exam due to morbid  obesity Extremities: warm and well perfused, no clubbing, cyanosis, or edema Skin: No rashes or lesions Neuro: Non-focal ECOG PERFORMANCE STATUS: 2 - Symptomatic, <50% confined to bed   LABORATORY DATA: Lab Results  Component Value Date   WBC 9.9 01/27/2013   HGB 11.9 01/27/2013   HCT 36.8 01/27/2013   MCV 98.1 01/27/2013   PLT 232 01/27/2013      Chemistry      Component Value Date/Time   NA 142 01/27/2013 0835   NA 135 10/31/2012 0655   K 3.4* 01/27/2013 0835   K 3.5 10/31/2012 0655   CL 97 10/31/2012 0655   CL 98 10/07/2012 0923   CO2 30* 01/27/2013 0835   CO2 29 10/31/2012 0655   BUN 18.1 01/27/2013 0835   BUN 8 10/31/2012 0655   CREATININE 0.7 01/27/2013 0835   CREATININE 0.78 10/31/2012 0655      Component Value Date/Time   CALCIUM 9.7 01/27/2013 0835   CALCIUM 8.3* 10/31/2012 0655   ALKPHOS 93 01/27/2013 0835   ALKPHOS 55 09/09/2012 1315   AST 19 01/27/2013 0835   AST 25 09/09/2012 1315   ALT 62* 01/27/2013 0835   ALT 17 09/09/2012 1315   BILITOT 0.55 01/27/2013 0835   BILITOT 0.4 09/09/2012 1315       RADIOGRAPHIC STUDIES:  Ct Chest W Contrast  10/21/2012   *RADIOLOGY REPORT*  Clinical Data:  Recurrent endometrial cancer.  CT CHEST,  ABDOMEN AND PELVIS WITH CONTRAST  Technique: Contiguous axial images of the chest abdomen and pelvis were obtained after IV contrast administration.  Contrast: 80 ml Omnipaque-300  Comparison: Abdominal pelvic CT of 02/11/2009.  No prior chest CT. Plain film chest of 12/23/2009 is reviewed.  CT CHEST  Findings: Lung windows demonstrate moderate degradation secondary to motion and patient body habitus. No airspace opacities.  Soft tissue windows demonstrate no definite supraclavicular adenopathy.  Mild cardiomegaly, without pericardial or pleural effusion.  No gross mediastinal or hilar adenopathy.  Tiny hiatal hernia.  IMPRESSION:  1.  Moderate degradation secondary to motion and patient body habitus. 2.  Given this factor, no acute process or evidence of metastatic disease within the chest.  CT ABDOMEN AND PELVIS  Findings:  Moderate to marked degradation involving the abdomen. Nondiagnostic evaluation of the pelvis.  Possible hyperenhancing nodule in the hepatic dome 1.7 cm on image 34/series 2.  Grossly normal spleen, distal stomach.  Pancreas not well evaluated.  Gallbladder not evaluated.  Grossly normal adrenal glands and kidneys.  No gross retroperitoneal abdominal adenopathy.  A moderate amount of abdominal ascites.  No gross bowel obstruction.    Pelvic adenopathy cannot be evaluated.  Moderate volume pelvic free fluid.  No gross osseous abnormality.  IMPRESSION:  1.  Moderate to markedly limited evaluation of the abdomen secondary patient body habitus.  Nondiagnostic evaluation of the pelvis. 2.  Moderate volume abdominal pelvic ascites.  No gross retroperitoneal abdominal adenopathy. 3.  Possible hepatic dome nodule, indeterminate.  Not readily apparent on the prior of 2010.  As the patient is not a candidate for MRI, this could be reevaluated on follow-up CTs.   Original Report Authenticated By: Jeronimo Greaves, M.D.   Ct Abdomen Pelvis W Contrast  10/21/2012   *RADIOLOGY REPORT*  Clinical Data:  Recurrent  endometrial cancer.  CT CHEST, ABDOMEN AND PELVIS WITH CONTRAST  Technique: Contiguous axial images of the chest abdomen and pelvis were obtained after IV contrast administration.  Contrast: 80 ml Omnipaque-300  Comparison: Abdominal pelvic CT of 02/11/2009.  No prior chest CT. Plain film chest of 12/23/2009 is reviewed.  CT CHEST  Findings: Lung windows demonstrate moderate degradation secondary to motion and patient body habitus. No airspace opacities.  Soft tissue windows demonstrate no definite supraclavicular adenopathy.  Mild cardiomegaly, without pericardial or pleural effusion.  No gross mediastinal or hilar adenopathy.  Tiny hiatal hernia.  IMPRESSION:  1.  Moderate degradation secondary to motion and patient body habitus. 2.  Given this factor, no acute process or evidence of metastatic disease within the chest.  CT ABDOMEN AND PELVIS  Findings:  Moderate to marked degradation involving the abdomen. Nondiagnostic evaluation of the pelvis.  Possible hyperenhancing nodule in the hepatic dome 1.7 cm on image 34/series 2.  Grossly normal spleen, distal stomach.  Pancreas not well evaluated.  Gallbladder not evaluated.  Grossly normal adrenal glands and kidneys.  No gross retroperitoneal abdominal adenopathy.  A moderate amount of abdominal ascites.  No gross bowel obstruction.    Pelvic adenopathy cannot be evaluated.  Moderate volume pelvic free fluid.  No gross osseous abnormality.  IMPRESSION:  1.  Moderate to markedly limited evaluation of the abdomen secondary patient body habitus.  Nondiagnostic evaluation of the pelvis. 2.  Moderate volume abdominal pelvic ascites.  No gross retroperitoneal abdominal adenopathy. 3.  Possible hepatic dome nodule, indeterminate.  Not readily apparent on the prior of 2010.  As the patient is not a candidate for MRI, this could be reevaluated on follow-up CTs.   Original Report Authenticated By: Jeronimo Greaves, M.D.   US Paracentesis  10/28/2012   *RADIOLOGY REPORT*   Clinical Data: Recurrent uterine cancer, abdominal distension and discomfort secondary to ascites.  ULTRASOUND GUIDED PARACENTESIS  Comparison:  Previous paracentesis  An ultrasound guided paracentesis was thoroughly discussed with the patient and questions answered.  The benefits, risks, alternatives and complications were also discussed.  The patient understands and wishes to proceed with the procedure.  Written consent was obtained.  Ultrasound was performed to localize and mark an adequate pocket of fluid in the left upper quadrant of the abdomen.  The area was then prepped and draped in the normal sterile fashion.  1% Lidocaine was used for local anesthesia.  Under ultrasound guidance a 19 gauge Yueh catheter was introduced.  Paracentesis was performed.  The catheter was removed and a dressing applied.  Complications:  None  Findings:  A total of approximately 10 liters of dark amber-colored fluid was removed.  A fluid sample was not sent for laboratory analysis.  IMPRESSION: Successful ultrasound guided paracentesis yielding 10 liters of ascites.  Read by Brayton El PA-C   Original Report Authenticated By: Richarda Overlie, M.D.   US Paracentesis  10/14/2012   *RADIOLOGY REPORT*  Clinical Data: Recurrent uterine cancer, abdominal distension and discomfort secondary to ascites.  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 Patient will be treated with Taxol and carboplatinum palliatively. Port a cath was placed in the OR, and patient will proceed with Taxol Carboplatin beginning 11/04/12.  She is currently cycle 5 day 1.    #2 Ascites: previously underwent paracenteses 1-2 times per week, she hasn't required one in about 6 weeks.     PLAN:   #1 Doing well.  Patient will proceed with chemotherapy.  I reviewed her labs with her and her mom and aunt in detail.    #2 We discussed her back pain and the possibility of  doing further imaging.  She  would like to leave a urine sample and if that is negative and if her back pain persists, she will agree to further imaging if necessary.    #3 Patient will return in one week for labs and evaluation for chemotoxicities.   All questions were answered. The patient knows to call the clinic with any problems, questions or concerns. We can certainly see the patient much sooner if necessary.  I spent 25 minutes counseling the patient face to face. The total time spent in the appointment was 30 minutes.  Larna Daughters, NP Medical Oncology [facility] Phone: [phone] 01/28/2013, 3:27 PM

## 2013-01-27 NOTE — Patient Instructions (Addendum)
Carboplatin injection  What is this medicine?  CARBOPLATIN (KAR boe pla tin) is a chemotherapy drug. It targets fast dividing cells, like cancer cells, and causes these cells to die. This medicine is used to treat ovarian cancer and many other cancers.  This medicine may be used for other purposes; ask your health care provider or pharmacist if you have questions.  What should I tell my health care provider before I take this medicine?  They need to know if you have any of these conditions:  -blood disorders  -hearing problems  -kidney disease  -recent or ongoing radiation therapy  -an unusual or allergic reaction to carboplatin, cisplatin, other chemotherapy, other medicines, foods, dyes, or preservatives  -pregnant or trying to get pregnant  -breast-feeding  How should I use this medicine?  This drug is usually given as an infusion into a vein. It is administered in a hospital or clinic by a specially trained health care professional.  Talk to your pediatrician regarding the use of this medicine in children. Special care may be needed.  Overdosage: If you think you have taken too much of this medicine contact a poison control center or emergency room at once.  NOTE: This medicine is only for you. Do not share this medicine with others.  What if I miss a dose?  It is important not to miss a dose. Call your doctor or health care professional if you are unable to keep an appointment.  What may interact with this medicine?  -medicines for seizures  -medicines to increase blood counts like filgrastim, pegfilgrastim, sargramostim  -some antibiotics like amikacin, gentamicin, neomycin, streptomycin, tobramycin  -vaccines  Talk to your doctor or health care professional before taking any of these medicines:  -acetaminophen  -aspirin  -ibuprofen  -ketoprofen  -naproxen  This list may not describe all possible interactions. Give your health care provider a list of all the medicines, herbs, non-prescription drugs, or dietary  supplements you use. Also tell them if you smoke, drink alcohol, or use illegal drugs. Some items may interact with your medicine.  What should I watch for while using this medicine?  Your condition will be monitored carefully while you are receiving this medicine. You will need important blood work done while you are taking this medicine.  This drug may make you feel generally unwell. This is not uncommon, as chemotherapy can affect healthy cells as well as cancer cells. Report any side effects. Continue your course of treatment even though you feel ill unless your doctor tells you to stop.  In some cases, you may be given additional medicines to help with side effects. Follow all directions for their use.  Call your doctor or health care professional for advice if you get a fever, chills or sore throat, or other symptoms of a cold or flu. Do not treat yourself. This drug decreases your body's ability to fight infections. Try to avoid being around people who are sick.  This medicine may increase your risk to bruise or bleed. Call your doctor or health care professional if you notice any unusual bleeding.  Be careful brushing and flossing your teeth or using a toothpick because you may get an infection or bleed more easily. If you have any dental work done, tell your dentist you are receiving this medicine.  Avoid taking products that contain aspirin, acetaminophen, ibuprofen, naproxen, or ketoprofen unless instructed by your doctor. These medicines may hide a fever.  Do not become pregnant while taking this medicine.   Do not breast-feed an infant while taking this medicine. What side effects may I notice from receiving this medicine? Side effects that you should report to your  doctor or health care professional as soon as possible: -allergic reactions like skin rash, itching or hives, swelling of the face, lips, or tongue -signs of infection - fever or chills, cough, sore throat, pain or difficulty passing urine -signs of decreased platelets or bleeding - bruising, pinpoint red spots on the skin, black, tarry stools, nosebleeds -signs of decreased red blood cells - unusually weak or tired, fainting spells, lightheadedness -breathing problems -changes in hearing -changes in vision -chest pain -high blood pressure -low blood counts - This drug may decrease the number of white blood cells, red blood cells and platelets. You may be at increased risk for infections and bleeding. -nausea and vomiting -pain, swelling, redness or irritation at the injection site -pain, tingling, numbness in the hands or feet -problems with balance, talking, walking -trouble passing urine or change in the amount of urine Side effects that usually do not require medical attention (report to your doctor or health care professional if they continue or are bothersome): -hair loss -loss of appetite -metallic taste in the mouth or changes in taste This list may not describe all possible side effects. Call your doctor for medical advice about side effects. You may report side effects to FDA at 1-800-FDA-1088. Where should I keep my medicine? This drug is given in a hospital or clinic and will not be stored at home. NOTE: This sheet is a summary. It may not cover all possible information. If you have questions about this medicine, talk to your doctor, pharmacist, or health care provider.  2012, [employer]/Gold Standard. (07/15/2007 2:38:05 PM)Paclitaxel injection What is this medicine? PACLITAXEL (PAK li TAX el) is a chemotherapy drug. It targets fast dividing cells, like cancer cells, and causes these cells to die. This medicine is used to treat ovarian cancer, breast cancer, and other  cancers. This medicine may be used for other purposes; ask your health care provider or pharmacist if you have questions. What should I tell my health care provider before I take this medicine? They need to know if you have any of these conditions: -blood disorders -irregular heartbeat -infection (especially a virus infection such as chickenpox, cold sores, or herpes) -liver disease -previous or ongoing radiation therapy -an unusual or allergic reaction to paclitaxel, alcohol, polyoxyethylated castor oil, other chemotherapy agents, other medicines, foods, dyes, or preservatives -pregnant or trying to get pregnant -breast-feeding How should I use this medicine? This drug is given as an infusion into a vein. It is administered in a hospital or clinic by a specially trained health care professional. Talk to your pediatrician regarding the use of this medicine in children. Special care may be needed. Overdosage: If you think you have taken too much of this medicine contact a poison control center or emergency room at once. NOTE: This medicine is only for you. Do not share this medicine with others. What if I miss a dose? It is important not to miss your dose. Call your doctor or health care professional if you are unable to keep an appointment. What may interact with this medicine? Do not take this medicine with any of the following medications: -disulfiram -metronidazole This medicine may also interact with the following medications: -cyclosporine -dexamethasone -diazepam -ketoconazole -medicines to increase blood counts like filgrastim, pegfilgrastim, sargramostim -other chemotherapy drugs like cisplatin, doxorubicin, epirubicin, etoposide, teniposide, vincristine -quinidine -testosterone -vaccines -  verapamil Talk to your doctor or health care professional before taking any of these medicines: -acetaminophen -aspirin -ibuprofen -ketoprofen -naproxen This list may not describe  all possible interactions. Give your health care provider a list of all the medicines, herbs, non-prescription drugs, or dietary supplements you use. Also tell them if you smoke, drink alcohol, or use illegal drugs. Some items may interact with your medicine. What should I watch for while using this medicine? Your condition will be monitored carefully while you are receiving this medicine. You will need important blood work done while you are taking this medicine. This drug may make you feel generally unwell. This is not uncommon, as chemotherapy can affect healthy cells as well as cancer cells. Report any side effects. Continue your course of treatment even though you feel ill unless your doctor tells you to stop. In some cases, you may be given additional medicines to help with side effects. Follow all directions for their use. Call your doctor or health care professional for advice if you get a fever, chills or sore throat, or other symptoms of a cold or flu. Do not treat yourself. This drug decreases your body's ability to fight infections. Try to avoid being around people who are sick. This medicine may increase your risk to bruise or bleed. Call your doctor or health care professional if you notice any unusual bleeding. Be careful brushing and flossing your teeth or using a toothpick because you may get an infection or bleed more easily. If you have any dental work done, tell your dentist you are receiving this medicine. Avoid taking products that contain aspirin, acetaminophen, ibuprofen, naproxen, or ketoprofen unless instructed by your doctor. These medicines may hide a fever. Do not become pregnant while taking this medicine. Women should inform their doctor if they wish to become pregnant or think they might be pregnant. There is a potential for serious side effects to an unborn child. Talk to your health care professional or pharmacist for more information. Do not breast-feed an infant while  taking this medicine. Men are advised not to father a child while receiving this medicine. What side effects may I notice from receiving this medicine? Side effects that you should report to your doctor or health care professional as soon as possible: -allergic reactions like skin rash, itching or hives, swelling of the face, lips, or tongue -low blood counts - This drug may decrease the number of white blood cells, red blood cells and platelets. You may be at increased risk for infections and bleeding. -signs of infection - fever or chills, cough, sore throat, pain or difficulty passing urine -signs of decreased platelets or bleeding - bruising, pinpoint red spots on the skin, black, tarry stools, nosebleeds -signs of decreased red blood cells - unusually weak or tired, fainting spells, lightheadedness -breathing problems -chest pain -high or low blood pressure -mouth sores -nausea and vomiting -pain, swelling, redness or irritation at the injection site -pain, tingling, numbness in the hands or feet -slow or irregular heartbeat -swelling of the ankle, feet, hands Side effects that usually do not require medical attention (report to your doctor or health care professional if they continue or are bothersome): -bone pain -complete hair loss including hair on your head, underarms, pubic hair, eyebrows, and eyelashes -changes in the color of fingernails -diarrhea -loosening of the fingernails -loss of appetite -muscle or joint pain -red flush to skin -sweating This list may not describe all possible side effects. Call your doctor for medical advice  about side effects. You may report side effects to FDA at 1-800-FDA-1088. Where should I keep my medicine? This drug is given in a hospital or clinic and will not be stored at home. NOTE: This sheet is a summary. It may not cover all possible information. If you have questions about this medicine, talk to your doctor, pharmacist, or health care  provider.  2013, [employer]vier/Gold Standard. (03/22/2008 11:54:26 AM)

## 2013-01-28 ENCOUNTER — Encounter (INDEPENDENT_AMBULATORY_CARE_PROVIDER_SITE_OTHER): Payer: Self-pay

## 2013-01-28 ENCOUNTER — Ambulatory Visit (HOSPITAL_BASED_OUTPATIENT_CLINIC_OR_DEPARTMENT_OTHER): Payer: BC Managed Care – PPO

## 2013-01-28 VITALS — BP 115/68 | HR 89 | Temp 97.6°F

## 2013-01-28 DIAGNOSIS — C549 Malignant neoplasm of corpus uteri, unspecified: Secondary | ICD-10-CM

## 2013-01-28 DIAGNOSIS — Z5189 Encounter for other specified aftercare: Secondary | ICD-10-CM

## 2013-01-28 DIAGNOSIS — C541 Malignant neoplasm of endometrium: Secondary | ICD-10-CM

## 2013-01-28 LAB — URINE CULTURE

## 2013-01-28 MED ORDER — PEGFILGRASTIM INJECTION 6 MG/0.6ML
6.0000 mg | Freq: Once | SUBCUTANEOUS | Status: AC
  Administered 2013-01-28: 6 mg via SUBCUTANEOUS
  Filled 2013-01-28: qty 0.6

## 2013-01-29 ENCOUNTER — Other Ambulatory Visit: Payer: Self-pay | Admitting: Adult Health

## 2013-01-29 ENCOUNTER — Ambulatory Visit: Payer: BC Managed Care – PPO | Admitting: Physical Therapy

## 2013-01-30 ENCOUNTER — Other Ambulatory Visit: Payer: Self-pay | Admitting: Emergency Medicine

## 2013-01-30 DIAGNOSIS — C541 Malignant neoplasm of endometrium: Secondary | ICD-10-CM

## 2013-01-30 MED ORDER — DEXAMETHASONE 4 MG PO TABS: 8.0000 mg | ORAL_TABLET | Freq: Two times a day (BID) | ORAL | Status: DC

## 2013-01-30 MED ORDER — UNABLE TO FIND: Status: DC

## 2013-02-02 ENCOUNTER — Telehealth: Payer: Self-pay | Admitting: Medical Oncology

## 2013-02-02 ENCOUNTER — Emergency Department (HOSPITAL_COMMUNITY)
Admission: EM | Admit: 2013-02-02 | Discharge: 2013-02-02 | Disposition: A | Discharge status: Home / Self Care | Payer: BC Managed Care – PPO | Attending: Emergency Medicine | Admitting: Emergency Medicine

## 2013-02-02 ENCOUNTER — Emergency Department (HOSPITAL_COMMUNITY): Payer: BC Managed Care – PPO

## 2013-02-02 ENCOUNTER — Encounter (HOSPITAL_COMMUNITY): Payer: Self-pay | Admitting: Emergency Medicine

## 2013-02-02 DIAGNOSIS — Y9289 Other specified places as the place of occurrence of the external cause: Secondary | ICD-10-CM | POA: Insufficient documentation

## 2013-02-02 DIAGNOSIS — R079 Chest pain, unspecified: Secondary | ICD-10-CM

## 2013-02-02 DIAGNOSIS — IMO0002 Reserved for concepts with insufficient information to code with codable children: Secondary | ICD-10-CM | POA: Insufficient documentation

## 2013-02-02 DIAGNOSIS — Y9389 Activity, other specified: Secondary | ICD-10-CM | POA: Insufficient documentation

## 2013-02-02 DIAGNOSIS — C55 Malignant neoplasm of uterus, part unspecified: Secondary | ICD-10-CM | POA: Insufficient documentation

## 2013-02-02 DIAGNOSIS — E669 Obesity, unspecified: Secondary | ICD-10-CM | POA: Insufficient documentation

## 2013-02-02 DIAGNOSIS — Z79899 Other long term (current) drug therapy: Secondary | ICD-10-CM | POA: Insufficient documentation

## 2013-02-02 DIAGNOSIS — R188 Other ascites: Secondary | ICD-10-CM | POA: Insufficient documentation

## 2013-02-02 DIAGNOSIS — Z8719 Personal history of other diseases of the digestive system: Secondary | ICD-10-CM | POA: Insufficient documentation

## 2013-02-02 DIAGNOSIS — X500XXA Overexertion from strenuous movement or load, initial encounter: Secondary | ICD-10-CM | POA: Insufficient documentation

## 2013-02-02 DIAGNOSIS — S298XXA Other specified injuries of thorax, initial encounter: Secondary | ICD-10-CM | POA: Insufficient documentation

## 2013-02-02 DIAGNOSIS — Z86718 Personal history of other venous thrombosis and embolism: Secondary | ICD-10-CM | POA: Insufficient documentation

## 2013-02-02 LAB — CBC
HCT: 36.5 % (ref 36.0–46.0)
Hemoglobin: 12.2 g/dL (ref 12.0–15.0)
MCV: 97.9 fL (ref 78.0–100.0)
RBC: 3.73 MIL/uL — ABNORMAL LOW (ref 3.87–5.11)
RDW: 20 % — ABNORMAL HIGH (ref 11.5–15.5)
WBC: 8.4 10*3/uL (ref 4.0–10.5)

## 2013-02-02 MED ORDER — OXYCODONE-ACETAMINOPHEN 5-325 MG PO TABS: 1.0000 | ORAL_TABLET | ORAL | Status: DC | PRN

## 2013-02-02 MED ORDER — HEPARIN SOD (PORK) LOCK FLUSH 100 UNIT/ML IV SOLN
500.0000 [IU] | Freq: Once | INTRAVENOUS | Status: AC
  Administered 2013-02-02: 500 [IU]

## 2013-02-02 MED ORDER — HYDROMORPHONE HCL PF 1 MG/ML IJ SOLN
1.0000 mg | Freq: Once | INTRAMUSCULAR | Status: AC
  Administered 2013-02-02: 1 mg via INTRAVENOUS
  Filled 2013-02-02: qty 1

## 2013-02-02 NOTE — Telephone Encounter (Signed)
Patient called [facility] requesting to speak to Larna Daughters NP, asap. Patient states called @ 0915 expecting to hear back within the hour. Mssg given to NP @ 0945.

## 2013-02-02 NOTE — ED Notes (Addendum)
Per EMS patient with Hx of uterine cancer, DVT, and obesity reports to ED for SOB after talking to Dr. Park Breed, who referred her to ED. Patient also c/o severe back pain. Patient states that as she was moving earlier she felt a pop in her lower ribs (unsure of laterality) and now it is painful to respire.

## 2013-02-02 NOTE — ED Provider Notes (Signed)
CSN: [id number]     Arrival date & time 02/02/13  1200 History   First MD Initiated Contact with Patient 02/02/13 1217     Chief Complaint  Patient presents with  . Shortness of Breath    HPI Patient has a history of uterine cancer currently getting chemotherapy.  She has a port in her right chest.  She reports yesterday she felt generally weak and was lifted up into her chair and developed severe pain in her right chest when she was lifted up.  She reports the pain in her right lateral chest is worse with movement of her arm and palpation of her right lateral chest.  She states she felt a pop in her right lateral chest when this occurred.  She denies abdominal pain.  No nausea or vomiting.  No unilateral leg swelling.  She has no prior history of pulmonary embolism.  She denies recent cough congestion or shortness of breath.  She does feels that she's become increasingly generally weak with some increasing low back pain.  Her physicians are working on obtaining an MRI in the open MRI scanner as an outpatient given her obesity at 422 pounds and her claustrophobia.  She denies new weakness in her lower extremities.  No bowel or bladder complaints.  Her pain is mild to moderate in severity   Past Medical History  Diagnosis Date  . Obesity   . DVT (deep venous thrombosis) 2009  . PONV (postoperative nausea and vomiting)   . Swelling     BOTH LEGS  . Rash     LOWER ABD  . Gallstones   . Ascites   . Uterine cancer    Past Surgical History  Procedure Laterality Date  . Ankle surgery  1950  . Wisdom tooth extraction    . Tonsillectomy and adenoidectomy    . Hysteroscopy w/d&c    . Abdominal hysterectomy  12/2009    [facility], [facility]  . Laparoscopy N/A 10/02/2012    Procedure: LAPAROSCOPY DIAGNOSTIC, perocentisis, omental biopsy;  Surgeon: Wilmon Arms. Tsuei, MD;  Location: [facility];  Service: General;  Laterality: N/A;  removed a total of 15,000 of acities  . Paracentesis     Family History  Problem  Relation Age of Onset  . Heart disease Father   . Diabetes Brother   . Heart disease Mother    History  Substance Use Topics  . Smoking status: Never Smoker   . Smokeless tobacco: Not on file  . Alcohol Use: Yes     Comment: social, Occassionally   OB History   Grav Para Term Preterm Abortions TAB SAB Ect Mult Living                 Review of Systems  All other systems reviewed and are negative.    Allergies  Review of patient's allergies indicates no known allergies.  Home Medications   Current Outpatient Rx  Name  Route  Sig  Dispense  Refill  . Ascorbic Acid (VITAMIN C) 1000 MG tablet   Oral   Take 1,000 mg by mouth daily.         Marland Kitchen CARBOPLATIN IV   Intravenous   Inject 1 each into the vein every 21 ( twenty-one) days.         . Cyanocobalamin (VITAMIN B-12 PO)   Oral   Take 2,500 mg by mouth daily.          Marland Kitchen dexamethasone (DECADRON) 4 MG tablet   Oral  Take 2 tablets (8 mg total) by mouth 2 (two) times daily with a meal. Take two times a day starting the day after chemotherapy for 3 days.   30 tablet   1   . folic acid (FOLVITE) 400 MCG tablet   Oral   Take 400 mcg by mouth daily.           Marland Kitchen lidocaine-prilocaine (EMLA) cream   Topical   Apply topically as needed. Apply to port-a-cath site 1-2 hours prior to treatment.   30 g   2   . loratadine (CLARITIN) 10 MG tablet   Oral   Take 10 mg by mouth daily.         Marland Kitchen LORazepam (ATIVAN) 0.5 MG tablet   Oral   Take 1 tablet (0.5 mg total) by mouth every 6 (six) hours as needed (Nausea or vomiting).   30 tablet   0   . magnesium gluconate (MAGONATE) 500 MG tablet   Oral   Take 500 mg by mouth daily.         . Multiple Vitamins-Minerals (MULTIVITAMIN WITH MINERALS) tablet   Oral   Take 1 tablet by mouth daily.          . ondansetron (ZOFRAN) 8 MG tablet   Oral   Take 1 tablet (8 mg total) by mouth 2 (two) times daily. Take two times a day starting the day after chemo for 3 days.  Then take two times a day as needed for nausea or vomiting.   30 tablet   1   . oxyCODONE-acetaminophen (PERCOCET) 10-325 MG per tablet   Oral   Take 1 tablet by mouth every 4 (four) hours as needed for pain.   90 tablet   0   . PACLitaxel (TAXOL IV)   Intravenous   Inject 1 each into the vein every 21 ( twenty-one) days.         . pantoprazole (PROTONIX) 40 MG tablet   Oral   Take 1 tablet (40 mg total) by mouth daily.   30 tablet   6   . pegfilgrastim (NEULASTA) 6 MG/0.6ML injection   Subcutaneous   Inject 6 mg into the skin every 21 ( twenty-one) days.         . potassium chloride (K-DUR) 10 MEQ tablet   Oral   Take 1 tablet (10 mEq total) by mouth 2 (two) times daily.   14 tablet   0   . PRESCRIPTION MEDICATION      every 21 ( twenty-one) days. CHEMOTHERAPY REGIMEN         . prochlorperazine (COMPAZINE) 10 MG tablet   Oral   Take 1 tablet (10 mg total) by mouth every 6 (six) hours as needed (Nausea or vomiting).   30 tablet   1   . promethazine (PHENERGAN) 12.5 MG tablet   Oral   Take 1 tablet (12.5 mg total) by mouth every 6 (six) hours as needed for nausea.   30 tablet   0   . sucralfate (CARAFATE) 1 GM/10ML suspension   Oral   Take 1 g by mouth 4 (four) times daily.         Marland Kitchen triamterene-hydrochlorothiazide (MAXZIDE-25) 37.5-25 MG per tablet   Oral   Take 1 tablet by mouth every morning.          Marland Kitchen UNABLE TO FIND      Super heavy duty wheelchair with anit-tippers and leg rests with heel loops  Diagnosis: Endometrial cancer  182.0; Ascites 789.59; severe fatigue 780.79   1 Units   0   . UNABLE TO FIND      Heavy duty rolling walker  Diagnosis: Endometrial cancer 182.0; Ascites 789.59; severe fatigue 780.79   1 Units   0   . ferrous sulfate 325 (65 FE) MG tablet   Oral   Take 325 mg by mouth 3 (three) times daily with meals.         Marland Kitchen oxyCODONE-acetaminophen (PERCOCET/ROXICET) 5-325 MG per tablet   Oral   Take 1 tablet by  mouth every 4 (four) hours as needed for pain.   20 tablet   0    BP 114/76  Pulse 93  Temp(Src) 97.8 F (36.6 C) (Oral)  Resp 20  SpO2 99% Physical Exam  Nursing note and vitals reviewed. Constitutional: She is oriented to person, place, and time. She appears well-developed and well-nourished. No distress.  HENT:  Head: Normocephalic and atraumatic.  Eyes: EOM are normal.  Neck: Normal range of motion.  Cardiovascular: Normal rate, regular rhythm and normal heart sounds.   Pulmonary/Chest: Effort normal and breath sounds normal.  Tenderness of right lateral chest wall without ecchymosis  Abdominal: Soft. She exhibits no distension. There is no tenderness.  Musculoskeletal: Normal range of motion.  Neurological: She is alert and oriented to person, place, and time.  Generally 5 out of 5 strength in bilateral upper lower extremity major muscle groups  Skin: Skin is warm and dry.  Psychiatric: She has a normal mood and affect. Judgment normal.    ED Course  Procedures (including critical care time) Labs Review Labs Reviewed  CBC - Abnormal; Notable for the following:    RBC 3.73 (*)    RDW 20.0 (*)    All other components within normal limits   Imaging Review Dg Chest 2 View  02/02/2013   CLINICAL DATA:  Uterine cancer. Chemotherapy. Weakness. Shortness of breath.  EXAM: CHEST  2 VIEW  COMPARISON:  12/23/2009 and CT dated 10/21/2012.  FINDINGS: The cardiac silhouette is borderline enlarged and magnified by the portable AP technique. Clear lungs with normal vascularity. Interval nodular densities overlying the right mid and lower lung zones is well as a faintly visualized linear density. Thoracic spine degenerative changes.  IMPRESSION: Nodular and linear densities overlying the right mid and lower lung zones. These most likely represent overlying artifacts. Followup chest radiographs are recommended to exclude any true lung nodules.   Electronically Signed   By: Gordan Payment  M.D.   On: 02/02/2013 12:56    EKG Interpretation   None       MDM   1. Chest pain    Her pain is reproducible in the right lateral chest.  It seems to be pain from where she was lifted last night.  Doubt pulmonary embolism.  Doubt pneumonia.  Chest x-ray without significant abnormality.  No hypoxia    Lyanne Co, MD 02/02/13 1623

## 2013-02-02 NOTE — ED Notes (Signed)
Bed: NW29 Expected date:  Expected time:  Means of arrival:  Comments: ems- cancer pt/ SOB

## 2013-02-02 NOTE — Telephone Encounter (Signed)
Received urgent message to call Ms. Ardelle Anton from Chauncy Lean RN.  Called patient at 1005 am.  Patient is obviously winded during conversation.  Her back pain is worsened as well as her lower extremity weakness.  She required help getting off the toilet and her brother helped her and she felt and heard a popping sensation and became very short of breath.  I recommended several times that the safest thing the patient could do was proceed immediately to the emergency room for evaluation of the shortness of breath and increasing weakness.  Patient verbalized understanding.    Illa Level, NP Medical Oncology [phone]

## 2013-02-02 NOTE — ED Notes (Signed)
Patient transported to X-ray 

## 2013-02-02 NOTE — ED Notes (Signed)
MD at bedside. 

## 2013-02-03 ENCOUNTER — Telehealth: Payer: Self-pay | Admitting: Oncology

## 2013-02-03 ENCOUNTER — Other Ambulatory Visit (HOSPITAL_BASED_OUTPATIENT_CLINIC_OR_DEPARTMENT_OTHER): Payer: BC Managed Care – PPO | Admitting: Lab

## 2013-02-03 ENCOUNTER — Ambulatory Visit (HOSPITAL_BASED_OUTPATIENT_CLINIC_OR_DEPARTMENT_OTHER): Payer: BC Managed Care – PPO | Admitting: Adult Health

## 2013-02-03 VITALS — BP 115/76 | HR 93 | Temp 97.8°F | Resp 18 | Ht 71.0 in

## 2013-02-03 DIAGNOSIS — M549 Dorsalgia, unspecified: Secondary | ICD-10-CM

## 2013-02-03 DIAGNOSIS — C541 Malignant neoplasm of endometrium: Secondary | ICD-10-CM

## 2013-02-03 DIAGNOSIS — E876 Hypokalemia: Secondary | ICD-10-CM

## 2013-02-03 DIAGNOSIS — C786 Secondary malignant neoplasm of retroperitoneum and peritoneum: Secondary | ICD-10-CM

## 2013-02-03 DIAGNOSIS — C549 Malignant neoplasm of corpus uteri, unspecified: Secondary | ICD-10-CM

## 2013-02-03 DIAGNOSIS — R180 Malignant ascites: Secondary | ICD-10-CM

## 2013-02-03 LAB — COMPREHENSIVE METABOLIC PANEL (CC13)
AST: 49 U/L — ABNORMAL HIGH (ref 5–34)
Alkaline Phosphatase: 118 U/L (ref 40–150)
BUN: 14.5 mg/dL (ref 7.0–26.0)
Calcium: 9.6 mg/dL (ref 8.4–10.4)
Chloride: 97 mEq/L — ABNORMAL LOW (ref 98–109)
Creatinine: 0.7 mg/dL (ref 0.6–1.1)
Sodium: 136 mEq/L (ref 136–145)
Total Protein: 6.4 g/dL (ref 6.4–8.3)

## 2013-02-03 LAB — CBC WITH DIFFERENTIAL/PLATELET
Basophils Absolute: 0 10*3/uL (ref 0.0–0.1)
EOS%: 0.1 % (ref 0.0–7.0)
HCT: 34.1 % — ABNORMAL LOW (ref 34.8–46.6)
HGB: 11.7 g/dL (ref 11.6–15.9)
LYMPH%: 14.3 % (ref 14.0–49.7)
MCH: 33.3 pg (ref 25.1–34.0)
MCV: 97.5 fL (ref 79.5–101.0)
MONO%: 10 % (ref 0.0–14.0)
NEUT%: 75.5 % (ref 38.4–76.8)
Platelets: 233 10*3/uL (ref 145–400)

## 2013-02-03 MED ORDER — POTASSIUM CHLORIDE ER 10 MEQ PO TBCR: 20.0000 meq | EXTENDED_RELEASE_TABLET | Freq: Two times a day (BID) | ORAL | Status: DC

## 2013-02-03 MED ORDER — CYCLOBENZAPRINE HCL 5 MG PO TABS: 5.0000 mg | ORAL_TABLET | Freq: Three times a day (TID) | ORAL | Status: DC | PRN

## 2013-02-03 NOTE — Progress Notes (Signed)
OFFICE PROGRESS NOTE  CC  Catherine Rua, MD [address] [place] [address] Dr. Laurette Schimke  DIAGNOSIS: 47 year old female with prior history of stage I a uterine cancer diagnosed in 2010. Now with recurrent disease diagnosed in May 2014   PRIOR THERAPY:  #1Patient subsequently developed excessive uterine bleeding. Bad tablet to dilatation and curettage with hysteroscopy. 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 Due to morbid obesity Mirena IUD was placed along with the addition of Aygestin. Patient had a dramatic directed weight loss with nutritional support she went from 523 pounds to 359 pounds in July of 2011. Patient continued to do well and is 12/27/2009 she at that time underwent a robotic-assisted laparoscopic hysterectomy, bilateral salpingo-oophorectomy, and mini laparotomy through the umbilical port with morcellation of uterus within about the delivery of the uterus. The final pathology did reveal an endometrial adenocarcinoma grade 2 with invasion limited to 1 mm of the myometrium  #2continued to do well until June 2014 when she developed right upper quadrant pain. It was presumed to be cholelithiasis. 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 Omental biopsies were obtained. The pathology was consistent with metastatic adenocarcinoma in the presumption of recurrent endometrial carcinoma was performed made. She was seen by Dr. Laurette Schimke on 10/07/2012. She was recommended that she undergo chemotherapy consisting of Taxol and carboplatinum and started this on 11/04/12.  #3 abdominal ascites: Multiple paracenteses  CURRENT THERAPY: Taxol/Carboplatinum cycle 5 day 8  INTERVAL HISTORY: [name] 47 y.o. female returns for followup visit today after receiving her taxol/carbo.  She went to the  emergency room yesterday due to acute popping and shortness of breath when being moved.  She also has had increasing weakness and lower back pain.  She is fatigued, and tired, and generally not feeling well at all.  She denies fevers, chills, nausea, vomiting, constipation, numbness.    MEDICAL HISTORY: Past Medical History  Diagnosis Date  . Obesity   . DVT (deep venous thrombosis) 2009  . PONV (postoperative nausea and vomiting)   . Swelling     BOTH LEGS  . Rash     LOWER ABD  . Gallstones   . Ascites   . Uterine cancer     ALLERGIES:  has No Known Allergies.  MEDICATIONS:  Current Outpatient Prescriptions  Medication Sig Dispense Refill  . Ascorbic Acid (VITAMIN C) 1000 MG tablet Take 1,000 mg by mouth daily.      Marland Kitchen CARBOPLATIN IV Inject 1 each into the vein every 21 ( twenty-one) days.      . Cyanocobalamin (VITAMIN B-12 PO) Take 2,500 mg by mouth daily.       . cyclobenzaprine (FLEXERIL) 5 MG tablet Take 1 tablet (5 mg total) by mouth 3 (three) times daily as needed for muscle spasms.  30 tablet  0  . dexamethasone (DECADRON) 4 MG tablet Take 2 tablets (8 mg total) by mouth 2 (two) times daily with a meal. Take two times a day starting the day after chemotherapy for 3 days.  30 tablet  1  . ferrous sulfate 325 (65 FE) MG tablet Take 325 mg by mouth 3 (three) times daily with meals.      . folic acid (FOLVITE) 400 MCG tablet Take 400 mcg by mouth daily.        Marland Kitchen lidocaine-prilocaine (EMLA) cream Apply  topically as needed. Apply to port-a-cath site 1-2 hours prior to treatment.  30 g  2  . loratadine (CLARITIN) 10 MG tablet Take 10 mg by mouth daily.      Marland Kitchen LORazepam (ATIVAN) 0.5 MG tablet Take 1 tablet (0.5 mg total) by mouth every 6 (six) hours as needed (Nausea or vomiting).  30 tablet  0  . magnesium gluconate (MAGONATE) 500 MG tablet Take 500 mg by mouth daily.      . Multiple Vitamins-Minerals (MULTIVITAMIN WITH MINERALS) tablet Take 1 tablet by mouth daily.       .  ondansetron (ZOFRAN) 8 MG tablet Take 1 tablet (8 mg total) by mouth 2 (two) times daily. Take two times a day starting the day after chemo for 3 days. Then take two times a day as needed for nausea or vomiting.  30 tablet  1  . oxyCODONE-acetaminophen (PERCOCET) 10-325 MG per tablet Take 1 tablet by mouth every 4 (four) hours as needed for pain.  90 tablet  0  . PACLitaxel (TAXOL IV) Inject 1 each into the vein every 21 ( twenty-one) days.      . pantoprazole (PROTONIX) 40 MG tablet Take 1 tablet (40 mg total) by mouth daily.  30 tablet  6  . pegfilgrastim (NEULASTA) 6 MG/0.6ML injection Inject 6 mg into the skin every 21 ( twenty-one) days.      . potassium chloride (K-DUR) 10 MEQ tablet Take 1 tablet (10 mEq total) by mouth 2 (two) times daily.  14 tablet  0  . PRESCRIPTION MEDICATION every 21 ( twenty-one) days. CHEMOTHERAPY REGIMEN      . prochlorperazine (COMPAZINE) 10 MG tablet Take 1 tablet (10 mg total) by mouth every 6 (six) hours as needed (Nausea or vomiting).  30 tablet  1  . promethazine (PHENERGAN) 12.5 MG tablet Take 1 tablet (12.5 mg total) by mouth every 6 (six) hours as needed for nausea.  30 tablet  0  . sucralfate (CARAFATE) 1 GM/10ML suspension Take 1 g by mouth 4 (four) times daily.      Marland Kitchen triamterene-hydrochlorothiazide (MAXZIDE-25) 37.5-25 MG per tablet Take 1 tablet by mouth every morning.       Marland Kitchen UNABLE TO FIND Super heavy duty wheelchair with anit-tippers and leg rests with heel loops  Diagnosis: Endometrial cancer 182.0; Ascites 789.59; severe fatigue 780.79  1 Units  0  . UNABLE TO FIND Heavy duty rolling walker  Diagnosis: Endometrial cancer 182.0; Ascites 789.59; severe fatigue 780.79  1 Units  0   No current facility-administered medications for this visit.    SURGICAL HISTORY:  Past Surgical History  Procedure Laterality Date  . Ankle surgery  1950  . Wisdom tooth extraction    . Tonsillectomy and adenoidectomy    . Hysteroscopy w/d&c    . Abdominal  hysterectomy  12/2009    [facility], [facility]  . Laparoscopy N/A 10/02/2012    Procedure: LAPAROSCOPY DIAGNOSTIC, perocentisis, omental biopsy;  Surgeon: Wilmon Arms. Tsuei, MD;  Location: [facility];  Service: General;  Laterality: N/A;  removed a total of 15,000 of acities  . Paracentesis      REVIEW OF SYSTEMS:  A 10 point review of systems was conducted and is otherwise negative except for what is noted above.     PHYSICAL EXAMINATION: Blood pressure 115/76, pulse 93, temperature 97.8 F (36.6 C), temperature source Oral, resp. rate 18, height 5\' 11"  (1.803 m), SpO2 97.00%. There is no weight on file to calculate BMI. General: Patient  is a morbidly obese well appearing female in no acute distress HEENT: PERRLA, sclerae anicteric no conjunctival pallor, MMM Neck: supple, no palpable adenopathy Lungs: clear to auscultation bilaterally, no wheezes, rhonchi, or rales Cardiovascular: regular rate rhythm, S1, S2, no murmurs, rubs or gallops Abdomen: Soft, non-tender, non-distended, normoactive bowel sounds, no HSM--limited exam due to morbid obesity Extremities: warm and well perfused, no clubbing, cyanosis, or edema Skin: No rashes or lesions Neuro: Non-focal ECOG PERFORMANCE STATUS: 2 - Symptomatic, <50% confined to bed   LABORATORY DATA: Lab Results  Component Value Date   WBC 5.2 02/03/2013   HGB 11.7 02/03/2013   HCT 34.1* 02/03/2013   MCV 97.5 02/03/2013   PLT 233 02/03/2013      Chemistry      Component Value Date/Time   NA 136 02/03/2013 1333   NA 135 10/31/2012 0655   K 3.0* 02/03/2013 1333   K 3.5 10/31/2012 0655   CL 97 10/31/2012 0655   CL 98 10/07/2012 0923   CO2 25 02/03/2013 1333   CO2 29 10/31/2012 0655   BUN 14.5 02/03/2013 1333   BUN 8 10/31/2012 0655   CREATININE 0.7 02/03/2013 1333   CREATININE 0.78 10/31/2012 0655      Component Value Date/Time   CALCIUM 9.6 02/03/2013 1333   CALCIUM 8.3* 10/31/2012 0655   ALKPHOS 118 02/03/2013 1333   ALKPHOS 55 09/09/2012 1315   AST 49*  02/03/2013 1333   AST 25 09/09/2012 1315   ALT 113* 02/03/2013 1333   ALT 17 09/09/2012 1315   BILITOT 1.08 02/03/2013 1333   BILITOT 0.4 09/09/2012 1315       RADIOGRAPHIC STUDIES:  Ct Chest W Contrast  10/21/2012   *RADIOLOGY REPORT*  Clinical Data:  Recurrent endometrial cancer.  CT CHEST, ABDOMEN AND PELVIS WITH CONTRAST  Technique: Contiguous axial images of the chest abdomen and pelvis were obtained after IV contrast administration.  Contrast: 80 ml Omnipaque-300  Comparison: Abdominal pelvic CT of 02/11/2009.  No prior chest CT. Plain film chest of 12/23/2009 is reviewed.  CT CHEST  Findings: Lung windows demonstrate moderate degradation secondary to motion and patient body habitus. No airspace opacities.  Soft tissue windows demonstrate no definite supraclavicular adenopathy.  Mild cardiomegaly, without pericardial or pleural effusion.  No gross mediastinal or hilar adenopathy.  Tiny hiatal hernia.  IMPRESSION:  1.  Moderate degradation secondary to motion and patient body habitus. 2.  Given this factor, no acute process or evidence of metastatic disease within the chest.  CT ABDOMEN AND PELVIS  Findings:  Moderate to marked degradation involving the abdomen. Nondiagnostic evaluation of the pelvis.  Possible hyperenhancing nodule in the hepatic dome 1.7 cm on image 34/series 2.  Grossly normal spleen, distal stomach.  Pancreas not well evaluated.  Gallbladder not evaluated.  Grossly normal adrenal glands and kidneys.  No gross retroperitoneal abdominal adenopathy.  A moderate amount of abdominal ascites.  No gross bowel obstruction.    Pelvic adenopathy cannot be evaluated.  Moderate volume pelvic free fluid.  No gross osseous abnormality.  IMPRESSION:  1.  Moderate to markedly limited evaluation of the abdomen secondary patient body habitus.  Nondiagnostic evaluation of the pelvis. 2.  Moderate volume abdominal pelvic ascites.  No gross retroperitoneal abdominal adenopathy. 3.  Possible hepatic  dome nodule, indeterminate.  Not readily apparent on the prior of 2010.  As the patient is not a candidate for MRI, this could be reevaluated on follow-up CTs.   Original Report Authenticated By: Jeronimo Greaves,  M.D.   Ct Abdomen Pelvis W Contrast  10/21/2012   *RADIOLOGY REPORT*  Clinical Data:  Recurrent endometrial cancer.  CT CHEST, ABDOMEN AND PELVIS WITH CONTRAST  Technique: Contiguous axial images of the chest abdomen and pelvis were obtained after IV contrast administration.  Contrast: 80 ml Omnipaque-300  Comparison: Abdominal pelvic CT of 02/11/2009.  No prior chest CT. Plain film chest of 12/23/2009 is reviewed.  CT CHEST  Findings: Lung windows demonstrate moderate degradation secondary to motion and patient body habitus. No airspace opacities.  Soft tissue windows demonstrate no definite supraclavicular adenopathy.  Mild cardiomegaly, without pericardial or pleural effusion.  No gross mediastinal or hilar adenopathy.  Tiny hiatal hernia.  IMPRESSION:  1.  Moderate degradation secondary to motion and patient body habitus. 2.  Given this factor, no acute process or evidence of metastatic disease within the chest.  CT ABDOMEN AND PELVIS  Findings:  Moderate to marked degradation involving the abdomen. Nondiagnostic evaluation of the pelvis.  Possible hyperenhancing nodule in the hepatic dome 1.7 cm on image 34/series 2.  Grossly normal spleen, distal stomach.  Pancreas not well evaluated.  Gallbladder not evaluated.  Grossly normal adrenal glands and kidneys.  No gross retroperitoneal abdominal adenopathy.  A moderate amount of abdominal ascites.  No gross bowel obstruction.    Pelvic adenopathy cannot be evaluated.  Moderate volume pelvic free fluid.  No gross osseous abnormality.  IMPRESSION:  1.  Moderate to markedly limited evaluation of the abdomen secondary patient body habitus.  Nondiagnostic evaluation of the pelvis. 2.  Moderate volume abdominal pelvic ascites.  No gross retroperitoneal abdominal  adenopathy. 3.  Possible hepatic dome nodule, indeterminate.  Not readily apparent on the prior of 2010.  As the patient is not a candidate for MRI, this could be reevaluated on follow-up CTs.   Original Report Authenticated By: Jeronimo Greaves, M.D.   US Paracentesis  10/28/2012   *RADIOLOGY REPORT*  Clinical Data: Recurrent uterine cancer, abdominal distension and discomfort secondary to ascites.  ULTRASOUND GUIDED PARACENTESIS  Comparison:  Previous paracentesis  An ultrasound guided paracentesis was thoroughly discussed with the patient and questions answered.  The benefits, risks, alternatives and complications were also discussed.  The patient understands and wishes to proceed with the procedure.  Written consent was obtained.  Ultrasound was performed to localize and mark an adequate pocket of fluid in the left upper quadrant of the abdomen.  The area was then prepped and draped in the normal sterile fashion.  1% Lidocaine was used for local anesthesia.  Under ultrasound guidance a 19 gauge Yueh catheter was introduced.  Paracentesis was performed.  The catheter was removed and a dressing applied.  Complications:  None  Findings:  A total of approximately 10 liters of dark amber-colored fluid was removed.  A fluid sample was not sent for laboratory analysis.  IMPRESSION: Successful ultrasound guided paracentesis yielding 10 liters of ascites.  Read by Brayton El PA-C   Original Report Authenticated By: Richarda Overlie, M.D.   US Paracentesis  10/14/2012   *RADIOLOGY REPORT*  Clinical Data: Recurrent uterine cancer, abdominal distension and discomfort secondary to ascites.  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 Patient will be treated with Taxol and carboplatinum palliatively. Port a cath was placed in the OR, and patient will proceed with Taxol Carboplatin beginning 11/04/12.  She is currently cycle 5 day 8.     #2 Ascites: previously underwent paracenteses 1-2  times per week, she hasn't required one in about 6 weeks.     PLAN:   #1 Patient is not feeling well today.  I ordered a stat MRI of her spine to evaluate her back pain.  I also ordered Flexeril for her back pain.  I encouraged her to continue with PO intake.  Her labs are stable.  I reviewed them with her in detail.    #2 Patient will return in one week for labs and evaluation.    All questions were answered. The patient knows to call the clinic with any problems, questions or concerns. We can certainly see the patient much sooner if necessary.  I spent 25 minutes counseling the patient face to face. The total time spent in the appointment was 30 minutes.  Larna Daughters, NP Medical Oncology [facility] Phone: [phone] 02/03/2013, 2:24 PM

## 2013-02-04 ENCOUNTER — Other Ambulatory Visit: Payer: Self-pay | Admitting: Adult Health

## 2013-02-04 ENCOUNTER — Encounter: Payer: Self-pay | Admitting: Adult Health

## 2013-02-04 ENCOUNTER — Telehealth: Payer: Self-pay | Admitting: *Deleted

## 2013-02-04 ENCOUNTER — Telehealth: Payer: Self-pay | Admitting: Oncology

## 2013-02-04 DIAGNOSIS — C541 Malignant neoplasm of endometrium: Secondary | ICD-10-CM

## 2013-02-04 NOTE — Telephone Encounter (Signed)
Message copied by GARNER, Gerald Leitz on Wed Feb 04, 2013 11:38 AM ------      Message from: Illa Level      Created: Wed Feb 04, 2013  8:37 AM      Regarding: RE: valium for [name]       5 mg po once 30 minutes before MRI, may repeat x 1.  Disp (4 tablets) no refills.        ----- Message -----         From: Jolinda Croak, RN         Sent: 02/03/2013   4:38 PM           To: Illa Level, NP      Subject: valium for [name]                                  Per [facility] to order valium for [name].       she will get MRI at [facility] on 10/16 at 10am.      Thanks,      Corrie Dandy       ------

## 2013-02-04 NOTE — Telephone Encounter (Signed)
Message copied by GARNER, Gerald Leitz on Wed Feb 04, 2013 11:33 AM ------      Message from: Illa Level      Created: Wed Feb 04, 2013  8:37 AM      Regarding: RE: valium for Jasmyne Hougland       5 mg po once 30 minutes before MRI, may repeat x 1.  Disp (4 tablets) no refills.        ----- Message -----         From: Jolinda Croak, RN         Sent: 02/03/2013   4:38 PM           To: Illa Level, NP      Subject: valium for Avriana Beed                                  Per [facility] to order valium for Oree Bonello.       she will get MRI at [facility] on 10/16 at 10am.      Thanks,      Corrie Dandy       ------

## 2013-02-04 NOTE — Telephone Encounter (Signed)
Call from pt's mother with request for medication needed prior to MRI. Called [facility] [place]. Carafate and Valium ready for pick up. Pharmacist will call pt and advise.

## 2013-02-04 NOTE — Telephone Encounter (Signed)
Patient's mother called asking about pre-med for MRI tomorrow morning at 10:00 am.  Also asked why they have a potassium and is she to begin taking this.  Informed her of the collaborative phone note information.  Mom asked that we only use the land line.

## 2013-02-04 NOTE — Telephone Encounter (Signed)
Called pt on mobile # [email or web address] with information regarding MRI pt to take 30 min prior may repeat x1. Rx called into pt's pharmacy, and is ready for pick up. Requested pt call back to confirm message received.

## 2013-02-05 ENCOUNTER — Ambulatory Visit: Payer: BC Managed Care – PPO | Admitting: Physical Therapy

## 2013-02-05 ENCOUNTER — Telehealth: Payer: Self-pay | Admitting: Adult Health

## 2013-02-05 NOTE — Telephone Encounter (Signed)
Called patient on 10/15 in the mid afternoon.  Talked to patient about potassium prescription and that her potassium was 3 and she needed to take all doses as prescribed.  Also discussed Valium with patient for her MRI.  Patient informed me that radiology has told her she cannot take any medications there.  I told her to take one tablet of Valium 5mg  1 hour prior to MRI, and that she could take another valium 5mg  30 minutes after the first one if needed.  Patient verbalized understanding and repeated instructions back to me.    Illa Level, NP Medical Oncology [facility] [phone]

## 2013-02-09 ENCOUNTER — Encounter: Payer: BC Managed Care – PPO | Admitting: Physical Therapy

## 2013-02-09 ENCOUNTER — Ambulatory Visit: Payer: BC Managed Care – PPO | Admitting: Physical Therapy

## 2013-02-10 ENCOUNTER — Ambulatory Visit: Payer: BC Managed Care – PPO | Admitting: Oncology

## 2013-02-10 ENCOUNTER — Other Ambulatory Visit: Payer: BC Managed Care – PPO | Admitting: Lab

## 2013-02-10 ENCOUNTER — Ambulatory Visit: Payer: BC Managed Care – PPO | Admitting: Physical Therapy

## 2013-02-10 ENCOUNTER — Other Ambulatory Visit (HOSPITAL_BASED_OUTPATIENT_CLINIC_OR_DEPARTMENT_OTHER): Payer: BC Managed Care – PPO | Admitting: Lab

## 2013-02-10 ENCOUNTER — Ambulatory Visit (HOSPITAL_BASED_OUTPATIENT_CLINIC_OR_DEPARTMENT_OTHER): Payer: BC Managed Care – PPO | Admitting: Family

## 2013-02-10 VITALS — BP 119/79 | HR 88 | Temp 97.6°F | Resp 20 | Ht 71.0 in | Wt >= 6400 oz

## 2013-02-10 DIAGNOSIS — C541 Malignant neoplasm of endometrium: Secondary | ICD-10-CM

## 2013-02-10 DIAGNOSIS — IMO0002 Reserved for concepts with insufficient information to code with codable children: Secondary | ICD-10-CM

## 2013-02-10 DIAGNOSIS — M549 Dorsalgia, unspecified: Secondary | ICD-10-CM

## 2013-02-10 DIAGNOSIS — C549 Malignant neoplasm of corpus uteri, unspecified: Secondary | ICD-10-CM

## 2013-02-10 DIAGNOSIS — C786 Secondary malignant neoplasm of retroperitoneum and peritoneum: Secondary | ICD-10-CM

## 2013-02-10 DIAGNOSIS — R180 Malignant ascites: Secondary | ICD-10-CM

## 2013-02-10 DIAGNOSIS — T148XXA Other injury of unspecified body region, initial encounter: Secondary | ICD-10-CM

## 2013-02-10 DIAGNOSIS — E876 Hypokalemia: Secondary | ICD-10-CM

## 2013-02-10 LAB — CBC WITH DIFFERENTIAL/PLATELET
BASO%: 0.2 % (ref 0.0–2.0)
Basophils Absolute: 0 10*3/uL (ref 0.0–0.1)
Eosinophils Absolute: 0 10*3/uL (ref 0.0–0.5)
HGB: 11.3 g/dL — ABNORMAL LOW (ref 11.6–15.9)
MCHC: 32.7 g/dL (ref 31.5–36.0)
MCV: 100.9 fL (ref 79.5–101.0)
MONO%: 4.4 % (ref 0.0–14.0)
NEUT#: 12.8 10*3/uL — ABNORMAL HIGH (ref 1.5–6.5)
RDW: 21.3 % — ABNORMAL HIGH (ref 11.2–14.5)

## 2013-02-10 LAB — COMPREHENSIVE METABOLIC PANEL (CC13)
Albumin: 3.3 g/dL — ABNORMAL LOW (ref 3.5–5.0)
Alkaline Phosphatase: 128 U/L (ref 40–150)
Anion Gap: 17 mEq/L — ABNORMAL HIGH (ref 3–11)
BUN: 22.1 mg/dL (ref 7.0–26.0)
CO2: 21 mEq/L — ABNORMAL LOW (ref 22–29)
Calcium: 9.5 mg/dL (ref 8.4–10.4)
Chloride: 102 mEq/L (ref 98–109)
Glucose: 150 mg/dl — ABNORMAL HIGH (ref 70–140)
Potassium: 2.9 mEq/L — CL (ref 3.5–5.1)

## 2013-02-10 MED ORDER — POTASSIUM CHLORIDE ER 10 MEQ PO TBCR: 20.0000 meq | EXTENDED_RELEASE_TABLET | Freq: Two times a day (BID) | ORAL | Status: DC

## 2013-02-10 MED ORDER — UNABLE TO FIND: Status: DC

## 2013-02-10 MED ORDER — LORAZEPAM 0.5 MG PO TABS: 0.5000 mg | ORAL_TABLET | Freq: Four times a day (QID) | ORAL | Status: DC | PRN

## 2013-02-10 NOTE — Patient Instructions (Signed)
Please contact us at [phone] if you have any questions or concerns.  Get plenty of rest, drink plenty of water, exercise daily (chair exercises), eat a balanced diet.  Potassium Rich Foods List - Foods High in Potassium      List of Foods High in Potassium: Foods with Potassium  Serving Size Potassium (mg)  Apricots, dried 10 halves 407  Avocados, raw 1 ounce 180  Bananas, raw 1 cup 594  Beets, cooked 1 cup 519  Brussel sprouts, cooked 1 cup 504  Cantaloupe 1 cup 494  Dates, dry 5 dates 271  Figs, dry 2 figs 271  Kiwi fruit, raw 1 medium 252  Lima beans 1 cup 955  Melons, honeydew 1 cup 461  Milk, fat free or skim 1 cup 407  Nectarines 1 nectarine 288  Orange juice 1 cup 496  Oranges 1 orange 237  Pears (fresh) 1 pear 208  Peanuts dry roasted, unsalted 1 ounce 187  Potatoes, baked, 1 potato 1081  Prune juice 1 cup 707  Prunes, dried 1 cup 828  Raisins 1 cup 1089  Spinach, cooked 1 cup 839  Tomato products, canned sauce 1 cup 909  Winter squash 1 cup 896  Yogurt plain, skim milk 8 ounces 579    Foods high in protein: Commonly eaten protein foods Meats* Lean cuts of: beef  ham  lamb  pork  veal Game meats bison  rabbit  venison Lean ground meats beef  pork  lamb Music therapist or deli meats Organ meats liver  giblets Poultry* chicken  duck  goose  [place]  ground chicken and [place] Eggs* chicken eggs  duck eggs Beans and peas bean burgers  black beans  black-eyed peas  chickpeas (garbanzo beans)  falafel  kidney beans  lentils  lima beans (mature)  navy beans  pinto beans  soy beans  split peas  white beans Nuts and seeds* almonds  cashews  hazelnuts (filberts)  mixed nuts  peanuts  peanut butter  pecans  pistachios  pumpkin seeds  sesame seeds  sunflower seeds  walnuts Seafood* Finfish such as: catfish  cod  flounder  haddock  halibut  herring  mackerel  pollock  porgy  salmon  sea bass  snapper  swordfish   trout  tuna Shellfish such as: clams  crab  crayfish  lobster  mussels  octopus  oysters  scallops  squid (calamari)  shrimp Canned fish such as: anchovies  clams  tuna  sardines  Results for orders placed in visit on 02/10/13 (from the past 24 hour(s))  CBC WITH DIFFERENTIAL     Status: Abnormal   Collection Time    02/10/13 12:50 PM      Result Value Range   WBC 15.3 (*) 3.9 - 10.3 10e3/uL   NEUT# 12.8 (*) 1.5 - 6.5 10e3/uL   HGB 11.3 (*) 11.6 - 15.9 g/dL   HCT 96.0 (*) 45.4 - 09.8 %   Platelets 142 (*) 145 - 400 10e3/uL   MCV 100.9  79.5 - 101.0 fL   MCH 32.9  25.1 - 34.0 pg   MCHC 32.7  31.5 - 36.0 g/dL   RBC 1.19 (*) 1.47 - 8.29 10e6/uL   RDW 21.3 (*) 11.2 - 14.5 %   lymph# 1.8  0.9 - 3.3 10e3/uL   MONO# 0.7  0.1 - 0.9 10e3/uL   Eosinophils Absolute 0.0  0.0 - 0.5 10e3/uL   Basophils Absolute 0.0  0.0 - 0.1 10e3/uL   NEUT% 83.4 (*)  38.4 - 76.8 %   LYMPH% 12.0 (*) 14.0 - 49.7 %   MONO% 4.4  0.0 - 14.0 %   EOS% 0.0  0.0 - 7.0 %   BASO% 0.2  0.0 - 2.0 %   Narrative:    Performed At:  [facility]               [address]               [place], [place] [address]  COMPREHENSIVE METABOLIC PANEL (CC13)     Status: Abnormal   Collection Time    02/10/13 12:50 PM      Result Value Range   Sodium 140  136 - 145 mEq/L   Potassium 2.9 (*) 3.5 - 5.1 mEq/L   Chloride 102  98 - 109 mEq/L   CO2 21 (*) 22 - 29 mEq/L   Glucose 150 (*) 70 - 140 mg/dl   BUN 60.4  7.0 - 54.0 mg/dL   Creatinine 0.8  0.6 - 1.1 mg/dL   Total Bilirubin 9.81  0.20 - 1.20 mg/dL   Alkaline Phosphatase 128  40 - 150 U/L   AST 21  5 - 34 U/L   ALT 66 (*) 0 - 55 U/L   Total Protein 6.1 (*) 6.4 - 8.3 g/dL   Albumin 3.3 (*) 3.5 - 5.0 g/dL   Calcium 9.5  8.4 - 19.1 mg/dL   Anion Gap 17 (*) 3 - 11 mEq/L   Narrative:    Performed At:  [facility]               [address]               [place], [place] [address]

## 2013-02-10 NOTE — Progress Notes (Signed)
[facility]  [phone] Fax:([phone]) [phone]  OFFICE PROGRESS NOTE   PATIENT: [name]   DOB: 1965/10/06  MR#: [id number]  JYN#:[id number]   QM:VHQION, Jeannett Senior, MD GYN ONC: Laurette Schimke, M.D. SU:  Manus Rudd, M.D.    DIAGNOSIS: [name] is a 47 y.o. female with a prior history of stage IA uterine cancer diagnosed in 2010.  Now with recurrent disease diagnosed in 08/2012.    PRIOR THERAPY: #1  Patient developed excessive uterine bleeding.  She underwent dilatation and curettage with hysteroscopy. 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 Due to morbid obesity Mirena IUD was placed along with Aygestin. Patient had a dramatic directed weight loss with nutritional support she went from 523 pounds to 359 pounds in 10/2009.  On 12/27/2009 she underwent a robotic-assisted laparoscopic hysterectomy, bilateral salpingo-oophorectomy, and mini laparotomy through the umbilical port with morcellation of uterus within about the delivery of the uterus. The final pathology revealed an endometrial adenocarcinoma grade 2 with invasion limited to 1 mm of the myometrium.  #2  Patient continued to do well until 09/2012 when she developed right upper quadrant pain. It was presumed to be cholelithiasis. 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 Omental biopsies were obtained. The pathology was consistent with metastatic adenocarcinoma with the presumption of recurrent endometrial carcinoma.  She was seen by Dr. Laurette Schimke on 10/07/2012.  Dr. Nelly Rout recommended that Ms. Zahm undergo chemotherapy consisting of Taxol and Carboplatinum.  Chemotherapy started on 11/04/2012.   #3 Abdominal ascites with multiple paracenteses.  #4 Back pain and subsequent MRI on 02/05/2013 revealed compression fractures of T10 and  T1.   CURRENT THERAPY:  Taxol/Carboplatinum    INTERVAL HISTORY:  [name] was seen today for followup of recent MRI imaging of thoracic spine and lumbar spine on 02/05/2013.  She is accompanied by her mother Tyler Aas and her aunt Steward Drone for today's office visit.  Since her last office visit on 02/03/2013, she reports ongoing fatigue, ongoing lower back pain, and her shortness of breath related to bruised ribs has improved.  We had not received a copy of her recent MRI scans from [facility] at the time of her visit, but contacted their office several times today to fax the reports to [place].   Her interval history is otherwise unremarkable.     PAST MEDICAL HISTORY: Past Medical History  Diagnosis Date  . Obesity   . DVT (deep venous thrombosis) 2009  . PONV (postoperative nausea and vomiting)   . Swelling     BOTH LEGS  . Rash     LOWER ABD  . Gallstones   . Ascites   . Uterine cancer     PAST SURGICAL HISTORY: Past Surgical History  Procedure Laterality Date  . Ankle surgery  1950  . Wisdom tooth extraction    . Tonsillectomy and adenoidectomy    . Hysteroscopy w/d&c    . Abdominal hysterectomy  12/2009    [facility], [facility]  . Laparoscopy N/A 10/02/2012    Procedure: LAPAROSCOPY DIAGNOSTIC, perocentisis, omental biopsy;  Surgeon: Wilmon Arms. Tsuei, MD;  Location: [facility];  Service: General;  Laterality: N/A;  removed a total of 15,000 of acities  . Paracentesis      FAMILY HISTORY: Family History  Problem Relation Age of Onset  . Heart disease Father   . Diabetes Brother   .  Heart disease Mother     SOCIAL HISTORY: History  Substance Use Topics  . Smoking status: Never Smoker   . Smokeless tobacco: Not on file  . Alcohol Use: Yes     Comment: social, Occassionally    ALLERGIES: No Known Allergies   MEDICATIONS:  Current Outpatient Prescriptions  Medication Sig Dispense Refill  . Ascorbic Acid (VITAMIN C) 1000 MG tablet Take 1,000 mg by mouth daily.      Marland Kitchen CARAFATE  1 GM/10ML suspension TAKE 10 MLS BY MOUTH 4 TIMES DAILY  420 mL  2  . CARBOPLATIN IV Inject 1 each into the vein every 21 ( twenty-one) days.      . Cyanocobalamin (VITAMIN B-12 PO) Take 2,500 mg by mouth daily.       . cyclobenzaprine (FLEXERIL) 5 MG tablet Take 1 tablet (5 mg total) by mouth 3 (three) times daily as needed for muscle spasms.  30 tablet  0  . dexamethasone (DECADRON) 4 MG tablet Take 2 tablets (8 mg total) by mouth 2 (two) times daily with a meal. Take two times a day starting the day after chemotherapy for 3 days.  30 tablet  1  . ferrous sulfate 325 (65 FE) MG tablet Take 325 mg by mouth 3 (three) times daily with meals.      . folic acid (FOLVITE) 400 MCG tablet Take 400 mcg by mouth daily.        Marland Kitchen lidocaine-prilocaine (EMLA) cream Apply topically as needed. Apply to port-a-cath site 1-2 hours prior to treatment.  30 g  2  . loratadine (CLARITIN) 10 MG tablet Take 10 mg by mouth daily.      Marland Kitchen LORazepam (ATIVAN) 0.5 MG tablet Take 1 tablet (0.5 mg total) by mouth every 6 (six) hours as needed (Nausea or vomiting).  30 tablet  0  . magnesium gluconate (MAGONATE) 500 MG tablet Take 500 mg by mouth daily.      . Multiple Vitamins-Minerals (MULTIVITAMIN WITH MINERALS) tablet Take 1 tablet by mouth daily.       . ondansetron (ZOFRAN) 8 MG tablet Take 1 tablet (8 mg total) by mouth 2 (two) times daily. Take two times a day starting the day after chemo for 3 days. Then take two times a day as needed for nausea or vomiting.  30 tablet  1  . oxyCODONE-acetaminophen (PERCOCET) 10-325 MG per tablet Take 1 tablet by mouth every 4 (four) hours as needed for pain.  90 tablet  0  . PACLitaxel (TAXOL IV) Inject 1 each into the vein every 21 ( twenty-one) days.      . pantoprazole (PROTONIX) 40 MG tablet Take 1 tablet (40 mg total) by mouth daily.  30 tablet  6  . pegfilgrastim (NEULASTA) 6 MG/0.6ML injection Inject 6 mg into the skin every 21 ( twenty-one) days.      . potassium chloride  (K-DUR) 10 MEQ tablet Take 2 tablets (20 mEq total) by mouth 2 (two) times daily.  12 tablet  1  . PRESCRIPTION MEDICATION every 21 ( twenty-one) days. CHEMOTHERAPY REGIMEN      . prochlorperazine (COMPAZINE) 10 MG tablet Take 1 tablet (10 mg total) by mouth every 6 (six) hours as needed (Nausea or vomiting).  30 tablet  1  . promethazine (PHENERGAN) 12.5 MG tablet Take 1 tablet (12.5 mg total) by mouth every 6 (six) hours as needed for nausea.  30 tablet  0  . triamterene-hydrochlorothiazide (MAXZIDE-25) 37.5-25 MG per tablet Take 1 tablet  by mouth every morning.       Marland Kitchen UNABLE TO FIND Super heavy duty wheelchair with anit-tippers and leg rests with heel loops  Diagnosis: Endometrial cancer 182.0; Ascites 789.59; severe fatigue 780.79  1 Units  0  . UNABLE TO FIND Heavy duty rolling walker  Diagnosis: Endometrial cancer 182.0; Ascites 789.59; severe fatigue 780.79  1 Units  0   No current facility-administered medications for this visit.      REVIEW OF SYSTEMS: A 10 point review of systems was completed and is negative except as noted above. Ms. Mattie specifically denies any other symptomatology including  fever or chills, headache, vision changes, swollen glands, cough or shortness of breath, chest pain or discomfort, nausea, vomiting, diarrhea, constipation, change in urinary or bowel habits, any other arthralgias/myalgias, unusual bleeding/bruising or any other symptomatology.   PHYSICAL EXAMINATION: BP 119/79  Pulse 88  Temp(Src) 97.6 F (36.4 C) (Oral)  Resp 20  Ht 5\' 11"  (1.803 m)  Wt 419 lb 3.2 oz (190.148 kg)  BMI 58.49 kg/m2    ECOG FS:  3 - Symptomatic, >50% confined to bed  General appearance: Alert, cooperative, well nourished, morbidly obese, mild  distress Head: Normocephalic, without obvious abnormality, atraumatic Eyes: Conjunctivae/corneas clear, PERRLA, EOMI Nose: Nares, septum and mucosa are normal, no drainage or sinus tenderness Neck: Excessive habitus,  supple, symmetrical, trachea midline, no tenderness Resp: Clear to auscultation bilaterally, no wheezes/rales/rhonchi Cardio: Regular rate and rhythm, S1, S2 normal, no murmur, click, rub or gallop, no edema, right chest Port-A-Cath without signs of infection  GI: Soft, distended, non-tender, hypoactive bowel sounds,  excessive habitus  Skin:  Bilateral lower extremity venous stasis with hyperpigmentation, skin warm and dry, no cyanosis  M/S:  Atraumatic,  limited range of motion and strength in lower extremities, no clubbing  Neurologic: Grossly normal, cranial nerves II through XII intact, alert and oriented x 3 Psych: Appropriate affect    LAB RESULTS: Lab Results  Component Value Date   WBC 15.3* 02/10/2013   NEUTROABS 12.8* 02/10/2013   HGB 11.3* 02/10/2013   HCT 34.6* 02/10/2013   MCV 100.9 02/10/2013   PLT 142* 02/10/2013      Chemistry      Component Value Date/Time   NA 140 02/10/2013 1250   NA 135 10/31/2012 0655   K 2.9* 02/10/2013 1250   K 3.5 10/31/2012 0655   CL 97 10/31/2012 0655   CL 98 10/07/2012 0923   CO2 21* 02/10/2013 1250   CO2 29 10/31/2012 0655   BUN 22.1 02/10/2013 1250   BUN 8 10/31/2012 0655   CREATININE 0.8 02/10/2013 1250   CREATININE 0.78 10/31/2012 0655      Component Value Date/Time   CALCIUM 9.5 02/10/2013 1250   CALCIUM 8.3* 10/31/2012 0655   ALKPHOS 128 02/10/2013 1250   ALKPHOS 55 09/09/2012 1315   AST 21 02/10/2013 1250   AST 25 09/09/2012 1315   ALT 66* 02/10/2013 1250   ALT 17 09/09/2012 1315   BILITOT 0.52 02/10/2013 1250   BILITOT 0.4 09/09/2012 1315       No results found for this basename: [id number]    No components found with this basename: [id number]     RADIOGRAPHIC STUDIES: Dg Chest 2 View 02/02/2013   CLINICAL DATA:  Uterine cancer. Chemotherapy. Weakness. Shortness of breath.  EXAM: CHEST  2 VIEW  COMPARISON:  12/23/2009 and CT dated 10/21/2012.  FINDINGS: The cardiac silhouette is borderline enlarged and magnified by the  portable AP technique. Clear  lungs with normal vascularity. Interval nodular densities overlying the right mid and lower lung zones is well as a faintly visualized linear density. Thoracic spine degenerative changes.  IMPRESSION: Nodular and linear densities overlying the right mid and lower lung zones. These most likely represent overlying artifacts. Followup chest radiographs are recommended to exclude any true lung nodules.   Electronically Signed   By: Gordan Payment M.D.   On: 02/02/2013 12:56   MR of the thoracic spine with and without contrast on 02/05/2013 showed: 1.  13 mm and 8 mm hyperintense lesions at T3 and T6 respectively, and possibly representing hemangiomas. 2.  Fractures with 20% and 25% loss of height involving T10 and T11 vertebrae. 3.  Minimal degenerative disc changes. 4.  Probable cholelithiasis.  In view of the above and the patient's history of cancer; further evaluation by means of either PET/CT or nuclear scan is suggested to rule out metastatic disease, etc. the findings were relate by phone to Dr. Welton Flakes at approximately 1320 hours.   MR lumbar spine with and without contrast on 02/05/2013 showed: 1.  Compression fractures of T10 and T11 detailed on a separate report of an MRI of the thoracic spine. 2.  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   ASSESSMENT: AAMIRA BISCHOFF is a 47 y.o. woman: #1 Recurrent uterine cancer with peritoneal carcinomatosis and ascites.  Port a cath was placed in the OR, and the patient has been treated in the palliative setting with Taxol and Carboplatinum that started on 11/04/2012.   11/04/12.  #2 Ascites: History of having paracenteses 1-2 times per week, she hasn't received/required a paracentesis since 11/24/2012.  #3 Back pain - Compression fractures at T10 and T11 per MRI  on 02/05/2013.  #4 Hypokalemia   PLAN: #1 She is currently scheduled to start cycle #6 of palliative chemotherapy consisting of Taxol and Carboplatin on 02/17/2013.  A written prescription for lorazepam 0.5 mg take one tablet by mouth every 6 hours as needed for nausea/vomiting/anxiety #30 with 0 refills was provided to the patient.  #2 Regarding back pain - scan results were reviewed with Dr. Welton Flakes and she would like the patient to referred to Dr. Kerby Nora, M.D. for kyphoplasty consultation.  Her current outpatient physical therapy will be discontinued in light of compression fractures.  We will request home physical therapy for her due to inability to ambulate, shortness of breath, and excruciating back pain.   #3  A signed prescription for specialized wheelchair was also sent to the patient's insurance company today.  The patient has not been able to ambulate since her diagnosis.  Home health physical therapy has been initiated for transfer skills and to make her safe at work and to facilitate ambulation once more.  Ms. Patane has a medical condition causing mobility impairment and she is unable to safely ambulate with a cane or walker.  She has expressed a willingness to use a wheelchair and has sufficient upper body strength to self propel the wheelchair.  She would benefit from having a manual wheelchair to perform ADLs at home and at work.  #4  The patient was given a prescription for potassium chloride 10 mEq takes 2 tablets by mouth twice a day for 3 days #12 with one refill for hypokalemia.    #5  We plan to see Ms. Latka on 02/17/2013 at which time  we will check laboratories of CBC and CMP before proceeding with planned chemotherapy cycle #6.  Further imaging to rule out metastatic disease can be discussed with Ms. Salvucci at the time of her next appointment.   All questions were answered.  The patient and her family were encouraged to contact us in the interim with any problems,  questions or concerns.    Larina Bras, NP-C 02/11/2013, 12:09 PM

## 2013-02-11 ENCOUNTER — Ambulatory Visit: Payer: BC Managed Care – PPO

## 2013-02-11 ENCOUNTER — Telehealth: Payer: Self-pay | Admitting: Family

## 2013-02-11 ENCOUNTER — Encounter: Payer: Self-pay | Admitting: Family

## 2013-02-11 NOTE — Telephone Encounter (Signed)
Spoke to [employer] about referral to Dr. Kerby Nora, MD interventional radiologists for kyphoplasty consultation related to T10 and T11 compression fractures.  Also will refer her to home health physical therapy.  Patient in agreement with plan.

## 2013-02-12 ENCOUNTER — Telehealth: Payer: Self-pay | Admitting: Oncology

## 2013-02-12 ENCOUNTER — Other Ambulatory Visit: Payer: Self-pay | Admitting: Oncology

## 2013-02-12 DIAGNOSIS — IMO0002 Reserved for concepts with insufficient information to code with codable children: Secondary | ICD-10-CM

## 2013-02-12 DIAGNOSIS — C541 Malignant neoplasm of endometrium: Secondary | ICD-10-CM

## 2013-02-13 ENCOUNTER — Ambulatory Visit: Payer: BC Managed Care – PPO | Admitting: Physical Therapy

## 2013-02-16 ENCOUNTER — Other Ambulatory Visit: Payer: Self-pay | Admitting: *Deleted

## 2013-02-16 ENCOUNTER — Encounter: Payer: BC Managed Care – PPO | Admitting: Physical Therapy

## 2013-02-16 DIAGNOSIS — C541 Malignant neoplasm of endometrium: Secondary | ICD-10-CM

## 2013-02-16 MED ORDER — DEXAMETHASONE 4 MG PO TABS: 8.0000 mg | ORAL_TABLET | Freq: Two times a day (BID) | ORAL | Status: DC

## 2013-02-17 ENCOUNTER — Other Ambulatory Visit (HOSPITAL_BASED_OUTPATIENT_CLINIC_OR_DEPARTMENT_OTHER): Payer: BC Managed Care – PPO | Admitting: Lab

## 2013-02-17 ENCOUNTER — Encounter: Payer: Self-pay | Admitting: Oncology

## 2013-02-17 ENCOUNTER — Other Ambulatory Visit: Payer: Self-pay | Admitting: Emergency Medicine

## 2013-02-17 ENCOUNTER — Ambulatory Visit (HOSPITAL_BASED_OUTPATIENT_CLINIC_OR_DEPARTMENT_OTHER): Payer: BC Managed Care – PPO

## 2013-02-17 ENCOUNTER — Ambulatory Visit (HOSPITAL_BASED_OUTPATIENT_CLINIC_OR_DEPARTMENT_OTHER): Payer: BC Managed Care – PPO | Admitting: Oncology

## 2013-02-17 VITALS — BP 107/82 | HR 92 | Temp 96.3°F | Resp 18 | Ht 71.0 in | Wt >= 6400 oz

## 2013-02-17 DIAGNOSIS — C549 Malignant neoplasm of corpus uteri, unspecified: Secondary | ICD-10-CM

## 2013-02-17 DIAGNOSIS — C541 Malignant neoplasm of endometrium: Secondary | ICD-10-CM

## 2013-02-17 DIAGNOSIS — E876 Hypokalemia: Secondary | ICD-10-CM

## 2013-02-17 DIAGNOSIS — C786 Secondary malignant neoplasm of retroperitoneum and peritoneum: Secondary | ICD-10-CM

## 2013-02-17 DIAGNOSIS — Z5111 Encounter for antineoplastic chemotherapy: Secondary | ICD-10-CM

## 2013-02-17 DIAGNOSIS — M549 Dorsalgia, unspecified: Secondary | ICD-10-CM

## 2013-02-17 DIAGNOSIS — R188 Other ascites: Secondary | ICD-10-CM

## 2013-02-17 LAB — COMPREHENSIVE METABOLIC PANEL (CC13)
ALT: 73 U/L — ABNORMAL HIGH (ref 0–55)
AST: 24 U/L (ref 5–34)
Anion Gap: 13 mEq/L — ABNORMAL HIGH (ref 3–11)
CO2: 30 mEq/L — ABNORMAL HIGH (ref 22–29)
Calcium: 9.7 mg/dL (ref 8.4–10.4)
Chloride: 100 mEq/L (ref 98–109)
Sodium: 143 mEq/L (ref 136–145)
Total Bilirubin: 0.62 mg/dL (ref 0.20–1.20)
Total Protein: 6.4 g/dL (ref 6.4–8.3)

## 2013-02-17 LAB — CBC WITH DIFFERENTIAL/PLATELET
Eosinophils Absolute: 0 10*3/uL (ref 0.0–0.5)
MCH: 34.2 pg — ABNORMAL HIGH (ref 25.1–34.0)
MONO#: 1 10*3/uL — ABNORMAL HIGH (ref 0.1–0.9)
NEUT#: 9.4 10*3/uL — ABNORMAL HIGH (ref 1.5–6.5)
RBC: 3.39 10*6/uL — ABNORMAL LOW (ref 3.70–5.45)
RDW: 22.3 % — ABNORMAL HIGH (ref 11.2–14.5)
WBC: 13.8 10*3/uL — ABNORMAL HIGH (ref 3.9–10.3)
nRBC: 1 % — ABNORMAL HIGH (ref 0–0)

## 2013-02-17 MED ORDER — OXYCODONE-ACETAMINOPHEN 10-325 MG PO TABS: 1.0000 | ORAL_TABLET | ORAL | Status: DC | PRN

## 2013-02-17 MED ORDER — SODIUM CHLORIDE 0.9 % IJ SOLN
10.0000 mL | INTRAMUSCULAR | Status: DC | PRN
  Administered 2013-02-17: 10 mL
  Filled 2013-02-17: qty 10

## 2013-02-17 MED ORDER — UNABLE TO FIND: Status: DC

## 2013-02-17 MED ORDER — SODIUM CHLORIDE 0.9 % IV SOLN
900.0000 mg | Freq: Once | INTRAVENOUS | Status: AC
  Administered 2013-02-17: 900 mg via INTRAVENOUS
  Filled 2013-02-17: qty 90

## 2013-02-17 MED ORDER — DEXAMETHASONE SODIUM PHOSPHATE 20 MG/5ML IJ SOLN
12.0000 mg | Freq: Once | INTRAMUSCULAR | Status: AC
  Administered 2013-02-17: 12 mg via INTRAVENOUS

## 2013-02-17 MED ORDER — DIPHENHYDRAMINE HCL 50 MG/ML IJ SOLN
INTRAMUSCULAR | Status: AC
  Filled 2013-02-17: qty 1

## 2013-02-17 MED ORDER — PACLITAXEL CHEMO INJECTION 300 MG/50ML
175.0000 mg/m2 | Freq: Once | INTRAVENOUS | Status: AC
  Administered 2013-02-17: 618 mg via INTRAVENOUS
  Filled 2013-02-17: qty 103

## 2013-02-17 MED ORDER — PALONOSETRON HCL INJECTION 0.25 MG/5ML
INTRAVENOUS | Status: AC
  Filled 2013-02-17: qty 5

## 2013-02-17 MED ORDER — PALONOSETRON HCL INJECTION 0.25 MG/5ML
0.2500 mg | Freq: Once | INTRAVENOUS | Status: AC
  Administered 2013-02-17: 0.25 mg via INTRAVENOUS

## 2013-02-17 MED ORDER — SODIUM CHLORIDE 0.9 % IV SOLN
Freq: Once | INTRAVENOUS | Status: AC
  Administered 2013-02-17: 11:00:00 via INTRAVENOUS

## 2013-02-17 MED ORDER — CYCLOBENZAPRINE HCL 5 MG PO TABS: 5.0000 mg | ORAL_TABLET | Freq: Three times a day (TID) | ORAL | Status: DC | PRN

## 2013-02-17 MED ORDER — SODIUM CHLORIDE 0.9 % IV SOLN
150.0000 mg | Freq: Once | INTRAVENOUS | Status: AC
  Administered 2013-02-17: 150 mg via INTRAVENOUS
  Filled 2013-02-17: qty 5

## 2013-02-17 MED ORDER — DIPHENHYDRAMINE HCL 50 MG/ML IJ SOLN
50.0000 mg | Freq: Once | INTRAMUSCULAR | Status: AC
  Administered 2013-02-17: 50 mg via INTRAVENOUS

## 2013-02-17 MED ORDER — FAMOTIDINE IN NACL 20-0.9 MG/50ML-% IV SOLN
INTRAVENOUS | Status: AC
  Filled 2013-02-17: qty 50

## 2013-02-17 MED ORDER — HEPARIN SOD (PORK) LOCK FLUSH 100 UNIT/ML IV SOLN
500.0000 [IU] | Freq: Once | INTRAVENOUS | Status: AC | PRN
  Administered 2013-02-17: 500 [IU]
  Filled 2013-02-17: qty 5

## 2013-02-17 MED ORDER — FAMOTIDINE IN NACL 20-0.9 MG/50ML-% IV SOLN
20.0000 mg | Freq: Once | INTRAVENOUS | Status: AC
  Administered 2013-02-17: 20 mg via INTRAVENOUS

## 2013-02-17 MED ORDER — HEPARIN SOD (PORK) LOCK FLUSH 100 UNIT/ML IV SOLN
250.0000 [IU] | Freq: Once | INTRAVENOUS | Status: DC | PRN
  Filled 2013-02-17: qty 5

## 2013-02-17 MED ORDER — DEXAMETHASONE SODIUM PHOSPHATE 20 MG/5ML IJ SOLN
INTRAMUSCULAR | Status: AC
  Filled 2013-02-17: qty 5

## 2013-02-17 NOTE — Progress Notes (Signed)
[facility]  [phone] Fax:([phone]  OFFICE PROGRESS NOTE   PATIENT: [name]   DOB: 1965/10/09  MR#: [id number]  ZHY#:[id number]   EX:BMWUXL, Jeannett Senior, MD GYN ONC: Laurette Schimke, M.D. SU:  Manus Rudd, M.D.    DIAGNOSIS: [name] is a 47 y.o. female with a prior history of stage IA uterine cancer diagnosed in 2010.  Now with recurrent disease diagnosed in 08/2012.    PRIOR THERAPY: #1  Patient developed excessive uterine bleeding.  She underwent dilatation and curettage with hysteroscopy. 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 Due to morbid obesity Mirena IUD was placed along with Aygestin. Patient had a dramatic directed weight loss with nutritional support she went from 523 pounds to 359 pounds in 10/2009.  On 12/27/2009 she underwent a robotic-assisted laparoscopic hysterectomy, bilateral salpingo-oophorectomy, and mini laparotomy through the umbilical port with morcellation of uterus within about the delivery of the uterus. The final pathology revealed an endometrial adenocarcinoma grade 2 with invasion limited to 1 mm of the myometrium.  #2  Patient continued to do well until 09/2012 when she developed right upper quadrant pain. It was presumed to be cholelithiasis. 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 Omental biopsies were obtained. The pathology was consistent with metastatic adenocarcinoma with the presumption of recurrent endometrial carcinoma.  She was seen by Dr. Laurette Schimke on 10/07/2012.  Dr. Nelly Rout recommended that [name] undergo chemotherapy consisting of Taxol and Carboplatinum.  Chemotherapy started on 11/04/2012.   #3 Abdominal ascites with multiple paracenteses.  #4 Back pain and subsequent MRI on 02/05/2013 revealed compression fractures of T10 and  T1.   CURRENT THERAPY:  Taxol/Carboplatinum cycle 6/8   INTERVAL HISTORY:  [name] was seen today for followup prior to her cycle 6 of Taxol carboplatinum. Patient still continues to have back pain. She was seen by interventional radiology for consideration of kyphoplasty. They are going to review her MRI reports and then make a decision. In the meantime patient continues to be very much in pain. We are giving her pain medications. She is also having significant problem with ambulation due to the pain and tingling numbness in her lower back as well as legs. This is making it impossible for her to ambulate and be mobile. Therefore recommendation is for a specialized wheelchair. We are working on getting this for her.  Otherwise she has no nausea vomiting fevers chills no night sweats. She has minimal abdominal discomfort. Her bowels are working well. Remainder of the 10 point review of systems is negative.   PAST MEDICAL HISTORY: Past Medical History  Diagnosis Date  . Obesity   . DVT (deep venous thrombosis) 2009  . PONV (postoperative nausea and vomiting)   . Swelling     BOTH LEGS  . Rash     LOWER ABD  . Gallstones   . Ascites   . Uterine cancer     PAST SURGICAL HISTORY: Past Surgical History  Procedure Laterality Date  . Ankle surgery  1950  . Wisdom tooth extraction    . Tonsillectomy and adenoidectomy    . Hysteroscopy w/d&c    . Abdominal hysterectomy  12/2009    [facility], [facility]  . Laparoscopy N/A 10/02/2012    Procedure: LAPAROSCOPY DIAGNOSTIC, perocentisis, omental biopsy;  Surgeon: Wilmon Arms. Corliss Skains, MD;  Location: [facility];  Service: General;  Laterality: N/A;  removed a total of 15,000 of acities  . Paracentesis      FAMILY HISTORY: Family History  Problem Relation Age of Onset  . Heart disease Father   . Diabetes Brother   . Heart disease Mother     SOCIAL HISTORY: History  Substance Use Topics  . Smoking status: Never Smoker   . Smokeless tobacco: Not on  file  . Alcohol Use: Yes     Comment: social, Occassionally    ALLERGIES: No Known Allergies   MEDICATIONS:  Current Outpatient Prescriptions  Medication Sig Dispense Refill  . Ascorbic Acid (VITAMIN C) 1000 MG tablet Take 1,000 mg by mouth daily.      Marland Kitchen CARAFATE 1 GM/10ML suspension TAKE 10 MLS BY MOUTH 4 TIMES DAILY  420 mL  2  . Cyanocobalamin (VITAMIN B-12 PO) Take 2,500 mg by mouth daily.       Marland Kitchen dexamethasone (DECADRON) 4 MG tablet Take 2 tablets (8 mg total) by mouth 2 (two) times daily with a meal. Take two times a day starting the day after chemotherapy for 3 days.  30 tablet  1  . ferrous sulfate 325 (65 FE) MG tablet Take 325 mg by mouth 3 (three) times daily with meals.      . folic acid (FOLVITE) 400 MCG tablet Take 400 mcg by mouth daily.        Marland Kitchen lidocaine-prilocaine (EMLA) cream Apply topically as needed. Apply to port-a-cath site 1-2 hours prior to treatment.  30 g  2  . loratadine (CLARITIN) 10 MG tablet Take 10 mg by mouth daily.      Marland Kitchen LORazepam (ATIVAN) 0.5 MG tablet Take 1 tablet (0.5 mg total) by mouth every 6 (six) hours as needed (Nausea or vomiting).  30 tablet  0  . magnesium gluconate (MAGONATE) 500 MG tablet Take 500 mg by mouth daily.      . Multiple Vitamins-Minerals (MULTIVITAMIN WITH MINERALS) tablet Take 1 tablet by mouth daily.       . ondansetron (ZOFRAN) 8 MG tablet Take 1 tablet (8 mg total) by mouth 2 (two) times daily. Take two times a day starting the day after chemo for 3 days. Then take two times a day as needed for nausea or vomiting.  30 tablet  1  . PACLitaxel (TAXOL IV) Inject 1 each into the vein every 21 ( twenty-one) days.      . pantoprazole (PROTONIX) 40 MG tablet Take 1 tablet (40 mg total) by mouth daily.  30 tablet  6  . prochlorperazine (COMPAZINE) 10 MG tablet Take 1 tablet (10 mg total) by mouth every 6 (six) hours as needed (Nausea or vomiting).  30 tablet  1  . promethazine (PHENERGAN) 12.5 MG tablet Take 1 tablet (12.5 mg total)  by mouth every 6 (six) hours as needed for nausea.  30 tablet  0  . triamterene-hydrochlorothiazide (MAXZIDE-25) 37.5-25 MG per tablet Take 1 tablet by mouth every morning.       Marland Kitchen CARBOPLATIN IV Inject 1 each into the vein every 21 ( twenty-one) days.      . cyclobenzaprine (FLEXERIL) 5 MG tablet Take 1 tablet (5 mg total) by mouth 3 (three) times daily as needed for muscle spasms.  60 tablet  1  . oxyCODONE-acetaminophen (PERCOCET) 10-325 MG per tablet Take 1 tablet by mouth every 4 (four) hours as needed for pain.  90 tablet  0  . pegfilgrastim (NEULASTA) 6 MG/0.6ML injection Inject 6 mg into the skin every 21 (  twenty-one) days.      . potassium chloride (K-DUR) 10 MEQ tablet Take 2 tablets (20 mEq total) by mouth 2 (two) times daily.  12 tablet  1  . PRESCRIPTION MEDICATION every 21 ( twenty-one) days. CHEMOTHERAPY REGIMEN      . UNABLE TO FIND Super heavy duty wheelchair with anit-tippers and leg rests with heel loops  Diagnosis: Endometrial cancer 182.0; Ascites 789.59; severe fatigue 780.79  1 Units  0  . UNABLE TO FIND Heavy duty rolling walker  Diagnosis: Endometrial cancer 182.0; Ascites 789.59; severe fatigue 780.79  1 Units  0   No current facility-administered medications for this visit.      REVIEW OF SYSTEMS: A 10 point review of systems was completed and is negative except as noted above. Ms. Tuckett specifically denies any other symptomatology including  fever or chills, headache, vision changes, swollen glands, cough or shortness of breath, chest pain or discomfort, nausea, vomiting, diarrhea, constipation, change in urinary or bowel habits, any other arthralgias/myalgias, unusual bleeding/bruising or any other symptomatology.   PHYSICAL EXAMINATION: BP 107/82  Pulse 92  Temp(Src) 96.3 F (35.7 C) (Oral)  Resp 18  Ht 5\' 11"  (1.803 m)  Wt 424 lb 3.2 oz (192.416 kg)  BMI 59.19 kg/m2  SpO2 97%    ECOG FS:  3 - Symptomatic, >50% confined to bed  General appearance:  Alert, cooperative, well nourished, morbidly obese, mild  distress Head: Normocephalic, without obvious abnormality, atraumatic Eyes: Conjunctivae/corneas clear, PERRLA, EOMI Nose: Nares, septum and mucosa are normal, no drainage or sinus tenderness Neck: Excessive habitus, supple, symmetrical, trachea midline, no tenderness Resp: Clear to auscultation bilaterally, no wheezes/rales/rhonchi Cardio: Regular rate and rhythm, S1, S2 normal, no murmur, click, rub or gallop, no edema, right chest Port-A-Cath without signs of infection  GI: Soft, distended, non-tender, hypoactive bowel sounds,  excessive habitus  Skin:  Bilateral lower extremity venous stasis with hyperpigmentation, skin warm and dry, no cyanosis  M/S:  Atraumatic,  limited range of motion and strength in lower extremities, no clubbing  Neurologic: Grossly normal, cranial nerves II through XII intact, alert and oriented x 3 Psych: Appropriate affect    LAB RESULTS: Lab Results  Component Value Date   WBC 13.8* 02/17/2013   NEUTROABS 9.4* 02/17/2013   HGB 11.6 02/17/2013   HCT 35.2 02/17/2013   MCV 103.8* 02/17/2013   PLT 154 02/17/2013      Chemistry      Component Value Date/Time   NA 140 02/10/2013 1250   NA 135 10/31/2012 0655   K 2.9* 02/10/2013 1250   K 3.5 10/31/2012 0655   CL 97 10/31/2012 0655   CL 98 10/07/2012 0923   CO2 21* 02/10/2013 1250   CO2 29 10/31/2012 0655   BUN 22.1 02/10/2013 1250   BUN 8 10/31/2012 0655   CREATININE 0.8 02/10/2013 1250   CREATININE 0.78 10/31/2012 0655      Component Value Date/Time   CALCIUM 9.5 02/10/2013 1250   CALCIUM 8.3* 10/31/2012 0655   ALKPHOS 128 02/10/2013 1250   ALKPHOS 55 09/09/2012 1315   AST 21 02/10/2013 1250   AST 25 09/09/2012 1315   ALT 66* 02/10/2013 1250   ALT 17 09/09/2012 1315   BILITOT 0.52 02/10/2013 1250   BILITOT 0.4 09/09/2012 1315       No results found for this basename: [id number]    No components found with this basename: [id number]      RADIOGRAPHIC STUDIES: Dg Chest 2 View 02/02/2013  CLINICAL DATA:  Uterine cancer. Chemotherapy. Weakness. Shortness of breath.  EXAM: CHEST  2 VIEW  COMPARISON:  12/23/2009 and CT dated 10/21/2012.  FINDINGS: The cardiac silhouette is borderline enlarged and magnified by the portable AP technique. Clear lungs with normal vascularity. Interval nodular densities overlying the right mid and lower lung zones is well as a faintly visualized linear density. Thoracic spine degenerative changes.  IMPRESSION: Nodular and linear densities overlying the right mid and lower lung zones. These most likely represent overlying artifacts. Followup chest radiographs are recommended to exclude any true lung nodules.   Electronically Signed   By: Gordan Payment M.D.   On: 02/02/2013 12:56   MR of the thoracic spine with and without contrast on 02/05/2013 showed: 1.  13 mm and 8 mm hyperintense lesions at T3 and T6 respectively, and possibly representing hemangiomas. 2.  Fractures with 20% and 25% loss of height involving T10 and T11 vertebrae. 3.  Minimal degenerative disc changes. 4.  Probable cholelithiasis.  In view of the above and the patient's history of cancer; further evaluation by means of either PET/CT or nuclear scan is suggested to rule out metastatic disease, etc. the findings were relate by phone to Dr. Welton Flakes at approximately 1320 hours.   MR lumbar spine with and without contrast on 02/05/2013 showed: 1.  Compression fractures of T10 and T11 detailed on a separate report of an MRI of the thoracic spine. 2.  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   ASSESSMENT: [name] is a 47 y.o. woman: #1 Recurrent uterine cancer with peritoneal carcinomatosis and ascites.  Port a cath was placed in the OR, and the patient has been  treated in the palliative setting with Taxol and Carboplatinum that started on 11/04/2012.   11/04/12.  #2 Ascites: History of having paracenteses 1-2 times per week, she hasn't received/required a paracentesis since 11/24/2012.  #3 Back pain - Compression fractures at T10 and T11 per MRI on 02/05/2013.  #4 Hypokalemia   PLAN: #1 proceed with  cycle #6 of palliative chemotherapy consisting of Taxol and Carboplatin on 02/17/2013.   #2 patient seen by IR for possible kyphoplasty for compression fractures of the back  #3  patient does need a wheelchair as she is unable to safely ambulate with a cane or walker. She is suffering from thoracic compression fractures causing pain making her own safe to ambulate.   #4  potassium is improved but she still is hypokalemic with a potassium of 3.3 she will continue potassium chloride at home.  #5  Patient to be seen back in 1 week for follow up and lab work  All questions were answered.  The patient and her family were encouraged to contact us in the interim with any problems, questions or concerns.   Drue Second, MD Medical/Oncology [facility] [phone] (beeper) [phone] (Office)  02/17/2013, 4:20 PM

## 2013-02-17 NOTE — Patient Instructions (Signed)
[facility] Discharge Instructions for Patients Receiving Chemotherapy  Today you received the following chemotherapy agents Taxol Carbo  To help prevent nausea and vomiting after your treatment, we encourage you to take your nausea medication as prescribed. If you develop nausea and vomiting that is not controlled by your nausea medication, call the clinic.   BELOW ARE SYMPTOMS THAT SHOULD BE REPORTED IMMEDIATELY:  *FEVER GREATER THAN 100.5 F  *CHILLS WITH OR WITHOUT FEVER  NAUSEA AND VOMITING THAT IS NOT CONTROLLED WITH YOUR NAUSEA MEDICATION  *UNUSUAL SHORTNESS OF BREATH  *UNUSUAL BRUISING OR BLEEDING  TENDERNESS IN MOUTH AND THROAT WITH OR WITHOUT PRESENCE OF ULCERS  *URINARY PROBLEMS  *BOWEL PROBLEMS  UNUSUAL RASH Items with * indicate a potential emergency and should be followed up as soon as possible.  Feel free to call the clinic you have any questions or concerns. The clinic phone number is [phone].

## 2013-02-18 ENCOUNTER — Ambulatory Visit (HOSPITAL_BASED_OUTPATIENT_CLINIC_OR_DEPARTMENT_OTHER): Payer: BC Managed Care – PPO

## 2013-02-18 VITALS — BP 99/64 | HR 100 | Temp 97.6°F

## 2013-02-18 DIAGNOSIS — C549 Malignant neoplasm of corpus uteri, unspecified: Secondary | ICD-10-CM

## 2013-02-18 DIAGNOSIS — Z5189 Encounter for other specified aftercare: Secondary | ICD-10-CM

## 2013-02-18 DIAGNOSIS — C541 Malignant neoplasm of endometrium: Secondary | ICD-10-CM

## 2013-02-18 MED ORDER — PEGFILGRASTIM INJECTION 6 MG/0.6ML
6.0000 mg | Freq: Once | SUBCUTANEOUS | Status: AC
  Administered 2013-02-18: 6 mg via SUBCUTANEOUS
  Filled 2013-02-18: qty 0.6

## 2013-02-19 ENCOUNTER — Encounter: Payer: BC Managed Care – PPO | Admitting: Physical Therapy

## 2013-02-19 ENCOUNTER — Other Ambulatory Visit: Payer: Self-pay | Admitting: Emergency Medicine

## 2013-02-24 ENCOUNTER — Other Ambulatory Visit: Payer: Self-pay | Admitting: Emergency Medicine

## 2013-02-24 ENCOUNTER — Other Ambulatory Visit: Payer: Self-pay | Admitting: Radiology

## 2013-02-24 ENCOUNTER — Other Ambulatory Visit (HOSPITAL_BASED_OUTPATIENT_CLINIC_OR_DEPARTMENT_OTHER): Payer: BC Managed Care – PPO | Admitting: Lab

## 2013-02-24 ENCOUNTER — Ambulatory Visit (HOSPITAL_BASED_OUTPATIENT_CLINIC_OR_DEPARTMENT_OTHER): Payer: BC Managed Care – PPO | Admitting: Adult Health

## 2013-02-24 ENCOUNTER — Encounter: Payer: Self-pay | Admitting: Adult Health

## 2013-02-24 VITALS — BP 106/75 | HR 105 | Temp 97.4°F | Resp 18 | Ht 71.0 in

## 2013-02-24 DIAGNOSIS — C541 Malignant neoplasm of endometrium: Secondary | ICD-10-CM

## 2013-02-24 DIAGNOSIS — C786 Secondary malignant neoplasm of retroperitoneum and peritoneum: Secondary | ICD-10-CM

## 2013-02-24 DIAGNOSIS — R188 Other ascites: Secondary | ICD-10-CM

## 2013-02-24 DIAGNOSIS — C549 Malignant neoplasm of corpus uteri, unspecified: Secondary | ICD-10-CM

## 2013-02-24 DIAGNOSIS — E876 Hypokalemia: Secondary | ICD-10-CM

## 2013-02-24 LAB — COMPREHENSIVE METABOLIC PANEL (CC13)
ALT: 112 U/L — ABNORMAL HIGH (ref 0–55)
AST: 42 U/L — ABNORMAL HIGH (ref 5–34)
Albumin: 3.4 g/dL — ABNORMAL LOW (ref 3.5–5.0)
Alkaline Phosphatase: 157 U/L — ABNORMAL HIGH (ref 40–150)
CO2: 20 mEq/L — ABNORMAL LOW (ref 22–29)
Creatinine: 0.7 mg/dL (ref 0.6–1.1)
Glucose: 153 mg/dl — ABNORMAL HIGH (ref 70–140)
Potassium: 2.9 mEq/L — CL (ref 3.5–5.1)
Sodium: 136 mEq/L (ref 136–145)
Total Bilirubin: 0.82 mg/dL (ref 0.20–1.20)
Total Protein: 6.3 g/dL — ABNORMAL LOW (ref 6.4–8.3)

## 2013-02-24 LAB — CBC WITH DIFFERENTIAL/PLATELET
BASO%: 0.1 % (ref 0.0–2.0)
Basophils Absolute: 0 10*3/uL (ref 0.0–0.1)
Eosinophils Absolute: 0 10*3/uL (ref 0.0–0.5)
MCHC: 33.8 g/dL (ref 31.5–36.0)
MCV: 103.5 fL — ABNORMAL HIGH (ref 79.5–101.0)
MONO#: 0.4 10*3/uL (ref 0.1–0.9)
MONO%: 13.5 % (ref 0.0–14.0)
NEUT#: 2 10*3/uL (ref 1.5–6.5)
RBC: 3.14 10*6/uL — ABNORMAL LOW (ref 3.70–5.45)
RDW: 21.8 % — ABNORMAL HIGH (ref 11.2–14.5)
WBC: 2.9 10*3/uL — ABNORMAL LOW (ref 3.9–10.3)

## 2013-02-24 MED ORDER — UNABLE TO FIND: Status: DC

## 2013-02-24 MED ORDER — POTASSIUM CHLORIDE CRYS ER 20 MEQ PO TBCR: 40.0000 meq | EXTENDED_RELEASE_TABLET | Freq: Two times a day (BID) | ORAL | Status: DC

## 2013-02-24 MED ORDER — LORAZEPAM 0.5 MG PO TABS: 0.5000 mg | ORAL_TABLET | Freq: Four times a day (QID) | ORAL | Status: DC | PRN

## 2013-02-24 NOTE — Patient Instructions (Addendum)
Labs are stable.  Please call us if you have any questions or concerns.    I recommend Aquaphor for the dry skin.

## 2013-02-24 NOTE — Progress Notes (Signed)
[facility]  Telephone:([phone]) [phone] [phone]  OFFICE PROGRESS NOTE   PATIENT: [name]   DOB: 10-Oct-1965  MR#: [id number]  JYN#:[id number]   QM:VHQION, Jeannett Senior, MD GYN ONC: Laurette Schimke, M.D. SU:  Manus Rudd, M.D.    DIAGNOSIS: [name] is a 46 y.o. female with a prior history of stage IA uterine cancer diagnosed in 2010.  Now with recurrent disease diagnosed in 08/2012.  PRIOR THERAPY: #1  Patient developed excessive uterine bleeding.  She underwent dilatation and curettage with hysteroscopy. 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 Due to morbid obesity Mirena IUD was placed along with Aygestin. Patient had a dramatic directed weight loss with nutritional support she went from 523 pounds to 359 pounds in 10/2009.  On 12/27/2009 she underwent a robotic-assisted laparoscopic hysterectomy, bilateral salpingo-oophorectomy, and mini laparotomy through the umbilical port with morcellation of uterus within about the delivery of the uterus. The final pathology revealed an endometrial adenocarcinoma grade 2 with invasion limited to 1 mm of the myometrium.  #2  Patient continued to do well until 09/2012 when she developed right upper quadrant pain. It was presumed to be cholelithiasis. 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 Omental biopsies were obtained. The pathology was consistent with metastatic adenocarcinoma with the presumption of recurrent endometrial carcinoma.  She was seen by Dr. Laurette Schimke on 10/07/2012.  Dr. Nelly Rout recommended that [name] undergo chemotherapy consisting of Taxol and Carboplatinum.  Chemotherapy started on 11/04/2012.   #3 Abdominal ascites with multiple paracenteses.  #4 Back pain and subsequent MRI on 02/05/2013 revealed compression fractures of T10 and  T11.   CURRENT THERAPY:  Taxol/Carboplatinum cycle 6 day 8   INTERVAL HISTORY: [name] returns today for follow up after receiving cycle 6 of Taxol/Carbo.  She is very uncomfortable due to her compression fractures of T10 and T11.  She is taking Percocet and Lorazepam.  Her pain is only decreased by getting in the right position in bed.  She has kyphoplasty on Thursday, November 6.  She tolerated chemotherapy well, and denies constipation.     PAST MEDICAL HISTORY: Past Medical History  Diagnosis Date  . Obesity   . DVT (deep venous thrombosis) 2009  . PONV (postoperative nausea and vomiting)   . Swelling     BOTH LEGS  . Rash     LOWER ABD  . Gallstones   . Ascites   . Uterine cancer     PAST SURGICAL HISTORY: Past Surgical History  Procedure Laterality Date  . Ankle surgery  1950  . Wisdom tooth extraction    . Tonsillectomy and adenoidectomy    . Hysteroscopy w/d&c    . Abdominal hysterectomy  12/2009    [facility], BSO  . Laparoscopy N/A 10/02/2012    Procedure: LAPAROSCOPY DIAGNOSTIC, perocentisis, omental biopsy;  Surgeon: Wilmon Arms. Tsuei, MD;  Location: [facility];  Service: General;  Laterality: N/A;  removed a total of 15,000 of acities  . Paracentesis      FAMILY HISTORY: Family History  Problem Relation Age of Onset  . Heart disease Father   . Diabetes Brother   . Heart disease Mother     SOCIAL HISTORY: History  Substance Use Topics  . Smoking status: Never Smoker   . Smokeless tobacco: Not on file  . Alcohol Use: Yes     Comment: social, Occassionally  ALLERGIES: No Known Allergies   MEDICATIONS:  Current Outpatient Prescriptions  Medication Sig Dispense Refill  . Ascorbic Acid (VITAMIN C) 1000 MG tablet Take 1,000 mg by mouth daily.      Marland Kitchen CARAFATE 1 GM/10ML suspension TAKE 10 MLS BY MOUTH 4 TIMES DAILY  420 mL  2  . CARBOPLATIN IV Inject 1 each into the vein every 21 ( twenty-one) days.      . Cyanocobalamin (VITAMIN B-12 PO) Take 2,500 mg by  mouth daily.       . cyclobenzaprine (FLEXERIL) 5 MG tablet Take 1 tablet (5 mg total) by mouth 3 (three) times daily as needed for muscle spasms.  60 tablet  1  . dexamethasone (DECADRON) 4 MG tablet Take 2 tablets (8 mg total) by mouth 2 (two) times daily with a meal. Take two times a day starting the day after chemotherapy for 3 days.  30 tablet  1  . ferrous sulfate 325 (65 FE) MG tablet Take 325 mg by mouth 3 (three) times daily with meals.      . folic acid (FOLVITE) 400 MCG tablet Take 400 mcg by mouth daily.        Marland Kitchen lidocaine-prilocaine (EMLA) cream Apply topically as needed. Apply to port-a-cath site 1-2 hours prior to treatment.  30 g  2  . loratadine (CLARITIN) 10 MG tablet Take 10 mg by mouth daily.      Marland Kitchen LORazepam (ATIVAN) 0.5 MG tablet Take 1 tablet (0.5 mg total) by mouth every 6 (six) hours as needed (Nausea or vomiting).  60 tablet  0  . magnesium gluconate (MAGONATE) 500 MG tablet Take 500 mg by mouth daily.      . Multiple Vitamins-Minerals (MULTIVITAMIN WITH MINERALS) tablet Take 1 tablet by mouth daily.       . ondansetron (ZOFRAN) 8 MG tablet Take 1 tablet (8 mg total) by mouth 2 (two) times daily. Take two times a day starting the day after chemo for 3 days. Then take two times a day as needed for nausea or vomiting.  30 tablet  1  . oxyCODONE-acetaminophen (PERCOCET) 10-325 MG per tablet Take 1 tablet by mouth every 4 (four) hours as needed for pain.  90 tablet  0  . PACLitaxel (TAXOL IV) Inject 1 each into the vein every 21 ( twenty-one) days.      . pantoprazole (PROTONIX) 40 MG tablet Take 1 tablet (40 mg total) by mouth daily.  30 tablet  6  . pegfilgrastim (NEULASTA) 6 MG/0.6ML injection Inject 6 mg into the skin every 21 ( twenty-one) days.      . potassium chloride (K-DUR) 10 MEQ tablet Take 2 tablets (20 mEq total) by mouth 2 (two) times daily.  12 tablet  1  . PRESCRIPTION MEDICATION every 21 ( twenty-one) days. CHEMOTHERAPY REGIMEN      . prochlorperazine  (COMPAZINE) 10 MG tablet Take 1 tablet (10 mg total) by mouth every 6 (six) hours as needed (Nausea or vomiting).  30 tablet  1  . promethazine (PHENERGAN) 12.5 MG tablet Take 1 tablet (12.5 mg total) by mouth every 6 (six) hours as needed for nausea.  30 tablet  0  . triamterene-hydrochlorothiazide (MAXZIDE-25) 37.5-25 MG per tablet Take 1 tablet by mouth every morning.       Marland Kitchen UNABLE TO FIND Heavy duty rolling walker  Diagnosis: Endometrial cancer 182.0; Ascites 789.59; severe fatigue 780.79  1 Units  0  . UNABLE TO FIND Super heavy duty wheelchair with  anit-tippers and leg rests with heel loops  Diagnosis: Endometrial cancer 182.0; Ascites 789.59; severe fatigue 780.79, thoracic compression fractures 733.13  1 Units  0   No current facility-administered medications for this visit.      REVIEW OF SYSTEMS: A 10 point review of systems was completed and is negative except as noted above.    PHYSICAL EXAMINATION: BP 106/75  Pulse 105  Temp(Src) 97.4 F (36.3 C) (Oral)  Resp 18  Ht 5\' 11"  (1.803 m)  General: Patient is a morbidly obese appearing female, diaphoretic, in obvious pain.   HEENT: PERRLA, sclerae anicteric no conjunctival pallor, MMM Neck: supple, no palpable adenopathy Lungs: clear to auscultation bilaterally, no wheezes, rhonchi, or rales Cardiovascular: regular rate rhythm, S1, S2, no murmurs, rubs or gallops Abdomen: Soft, non-tender, non-distended, normoactive bowel sounds, no HSM, limited exam due to obesity, and inability to get onto exam table. Extremities: warm and well perfused, no clubbing, cyanosis, or edema Skin: No rashes or lesions Neuro: Non-focal ECOG FS:  3 - Symptomatic, >50% confined to bed      LAB RESULTS: Lab Results  Component Value Date   WBC 2.9* 02/24/2013   NEUTROABS 2.0 02/24/2013   HGB 11.0* 02/24/2013   HCT 32.5* 02/24/2013   MCV 103.5* 02/24/2013   PLT 125* 02/24/2013      Chemistry      Component Value Date/Time   NA 143  02/17/2013 0849   NA 135 10/31/2012 0655   K 3.3* 02/17/2013 0849   K 3.5 10/31/2012 0655   CL 97 10/31/2012 0655   CL 98 10/07/2012 0923   CO2 30* 02/17/2013 0849   CO2 29 10/31/2012 0655   BUN 24.5 02/17/2013 0849   BUN 8 10/31/2012 0655   CREATININE 0.7 02/17/2013 0849   CREATININE 0.78 10/31/2012 0655      Component Value Date/Time   CALCIUM 9.7 02/17/2013 0849   CALCIUM 8.3* 10/31/2012 0655   ALKPHOS 183* 02/17/2013 0849   ALKPHOS 55 09/09/2012 1315   AST 24 02/17/2013 0849   AST 25 09/09/2012 1315   ALT 73* 02/17/2013 0849   ALT 17 09/09/2012 1315   BILITOT 0.62 02/17/2013 0849   BILITOT 0.4 09/09/2012 1315       No results found for this basename: [id number]    No components found with this basename: [id number]     RADIOGRAPHIC STUDIES: Dg Chest 2 View 02/02/2013   CLINICAL DATA:  Uterine cancer. Chemotherapy. Weakness. Shortness of breath.  EXAM: CHEST  2 VIEW  COMPARISON:  12/23/2009 and CT dated 10/21/2012.  FINDINGS: The cardiac silhouette is borderline enlarged and magnified by the portable AP technique. Clear lungs with normal vascularity. Interval nodular densities overlying the right mid and lower lung zones is well as a faintly visualized linear density. Thoracic spine degenerative changes.  IMPRESSION: Nodular and linear densities overlying the right mid and lower lung zones. These most likely represent overlying artifacts. Followup chest radiographs are recommended to exclude any true lung nodules.   Electronically Signed   By: Gordan Payment M.D.   On: 02/02/2013 12:56   MR of the thoracic spine with and without contrast on 02/05/2013 showed: 1.  13 mm and 8 mm hyperintense lesions at T3 and T6 respectively, and possibly representing hemangiomas. 2.  Fractures with 20% and 25% loss of height involving T10 and T11 vertebrae. 3.  Minimal degenerative disc changes. 4.  Probable cholelithiasis.  In view of the above and the patient's history of cancer; further evaluation  by  means of either PET/CT or nuclear scan is suggested to rule out metastatic disease, etc. the findings were relate by phone to Dr. Welton Flakes at approximately 1320 hours.   MR lumbar spine with and without contrast on 02/05/2013 showed: 1.  Compression fractures of T10 and T11 detailed on a separate report of an MRI of the thoracic spine. 2.  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   ASSESSMENT: [name] is a 47 y.o. woman: #1 Recurrent uterine cancer with peritoneal carcinomatosis and ascites.  Port a cath was placed in the OR, and the patient has been treated in the palliative setting with Taxol and Carboplatinum that started on 11/04/2012.    #2 Ascites: History of having paracenteses 1-2 times per week, she hasn't received/required a paracentesis since 11/24/2012.  #3 Back pain - Compression fractures at T10 and T11 per MRI on 02/05/2013.  #4 Hypokalemia   PLAN: #1 Patient's labs are stable and she has appeared to tolerate chemotherapy relatively well last week.  I reviewed her labs in detail.    #2 She will proceed with Kyphoplasty on 02/26/13.    #3 Patient has a medical condition causing mobility impairment and is unable to safely ambulate with a cane or walker. She is suffering from thoracic compression fractures causing pain making her unsafe to ambulate. She has expressed a willingness to use the wheelchair and has sufficient upper body strength to self-propel the wheelchair. She would benefit from having a manual wheelchair to perform ADLs in the home and at work.   #4  potassium is improved but she still is hypokalemic with a potassium of 2.9 she will take Kdur 40 meq po bid.  The labs resulted after the patient left, so I have asked Lorenza Evangelist, RN to call the patient and inform her.  She will return on  Thursday for repeat labs, and IV hydration.   #5  Patient to be seen back in 2 weeks for her next treatment.   All questions were answered.  The patient and her family were encouraged to contact us in the interim with any problems, questions or concerns.   I spent 25 minutes counseling the patient face to face.  The total time spent in the appointment was 30 minutes.   Illa Level, NP Medical Oncology [facility] [phone]   02/24/2013, 2:30 PM

## 2013-02-24 NOTE — Telephone Encounter (Signed)
APPTS MADE AND PRINTED. PT IS AWARE THAT mw WILL ADD THE TXS. I EMAILED MW.Marland KitchenMarland KitchenTD

## 2013-02-25 ENCOUNTER — Other Ambulatory Visit: Payer: Self-pay | Admitting: Emergency Medicine

## 2013-02-26 ENCOUNTER — Other Ambulatory Visit: Payer: BC Managed Care – PPO

## 2013-02-26 ENCOUNTER — Ambulatory Visit (HOSPITAL_COMMUNITY)
Admission: RE | Admit: 2013-02-26 | Discharge: 2013-02-26 | Disposition: A | Discharge status: Home / Self Care | Payer: BC Managed Care – PPO | Source: Ambulatory Visit | Attending: Oncology | Admitting: Oncology

## 2013-02-26 ENCOUNTER — Telehealth: Payer: Self-pay | Admitting: *Deleted

## 2013-02-26 ENCOUNTER — Other Ambulatory Visit: Payer: Self-pay | Admitting: Emergency Medicine

## 2013-02-26 ENCOUNTER — Telehealth: Payer: Self-pay | Admitting: Adult Health

## 2013-02-26 ENCOUNTER — Other Ambulatory Visit: Payer: Self-pay | Admitting: Oncology

## 2013-02-26 ENCOUNTER — Encounter (HOSPITAL_COMMUNITY): Payer: Self-pay

## 2013-02-26 DIAGNOSIS — IMO0002 Reserved for concepts with insufficient information to code with codable children: Secondary | ICD-10-CM

## 2013-02-26 DIAGNOSIS — E669 Obesity, unspecified: Secondary | ICD-10-CM | POA: Diagnosis not present

## 2013-02-26 DIAGNOSIS — Z8542 Personal history of malignant neoplasm of other parts of uterus: Secondary | ICD-10-CM | POA: Insufficient documentation

## 2013-02-26 DIAGNOSIS — M8448XA Pathological fracture, other site, initial encounter for fracture: Secondary | ICD-10-CM | POA: Diagnosis present

## 2013-02-26 DIAGNOSIS — C541 Malignant neoplasm of endometrium: Secondary | ICD-10-CM

## 2013-02-26 HISTORY — PX: KYPHOPLASTY: SHX5884

## 2013-02-26 LAB — COMPREHENSIVE METABOLIC PANEL
ALT: 112 U/L — ABNORMAL HIGH (ref 0–35)
AST: 36 U/L (ref 0–37)
CO2: 24 mEq/L (ref 19–32)
Calcium: 9.5 mg/dL (ref 8.4–10.5)
Creatinine, Ser: 0.66 mg/dL (ref 0.50–1.10)
GFR calc Af Amer: >90 mL/min (ref >90)
Glucose, Bld: 135 mg/dL — ABNORMAL HIGH (ref 70–99)
Potassium: 2.9 mEq/L — ABNORMAL LOW (ref 3.5–5.1)
Sodium: 139 mEq/L (ref 135–145)
Total Protein: 6.3 g/dL (ref 6.0–8.3)

## 2013-02-26 LAB — CBC
Hemoglobin: 11.1 g/dL — ABNORMAL LOW (ref 12.0–15.0)
MCH: 36.2 pg — ABNORMAL HIGH (ref 26.0–34.0)
MCHC: 34.8 g/dL (ref 30.0–36.0)
MCV: 103.9 fL — ABNORMAL HIGH (ref 78.0–100.0)
Platelets: 155 10*3/uL (ref 150–400)

## 2013-02-26 LAB — PROTIME-INR: INR: 1.08 (ref 0.00–1.49)

## 2013-02-26 LAB — APTT: aPTT: 21 seconds — ABNORMAL LOW (ref 24–37)

## 2013-02-26 MED ORDER — MIDAZOLAM HCL 2 MG/2ML IJ SOLN
INTRAMUSCULAR | Status: AC
  Filled 2013-02-26: qty 4

## 2013-02-26 MED ORDER — FENTANYL CITRATE 0.05 MG/ML IJ SOLN
INTRAMUSCULAR | Status: AC | PRN
  Administered 2013-02-26 (×4): 25 ug via INTRAVENOUS

## 2013-02-26 MED ORDER — HYDROMORPHONE HCL PF 1 MG/ML IJ SOLN
INTRAMUSCULAR | Status: AC
  Filled 2013-02-26: qty 4

## 2013-02-26 MED ORDER — TOBRAMYCIN SULFATE 1.2 G IJ SOLR
INTRAMUSCULAR | Status: AC
  Filled 2013-02-26: qty 1.2

## 2013-02-26 MED ORDER — SODIUM CHLORIDE 0.9 % IV SOLN
Freq: Once | INTRAVENOUS | Status: AC
  Administered 2013-02-26: 08:00:00 via INTRAVENOUS

## 2013-02-26 MED ORDER — HEPARIN SOD (PORK) LOCK FLUSH 100 UNIT/ML IV SOLN
INTRAVENOUS | Status: AC
  Filled 2013-02-26: qty 5

## 2013-02-26 MED ORDER — POTASSIUM CHLORIDE 10 MEQ/100ML IV SOLN
10.0000 meq | INTRAVENOUS | Status: AC
  Administered 2013-02-26 (×2): 10 meq via INTRAVENOUS
  Filled 2013-02-26 (×2): qty 100

## 2013-02-26 MED ORDER — HYDROMORPHONE HCL PF 1 MG/ML IJ SOLN
INTRAMUSCULAR | Status: AC | PRN
  Administered 2013-02-26 (×2): 1 mg

## 2013-02-26 MED ORDER — MIDAZOLAM HCL 2 MG/2ML IJ SOLN
INTRAMUSCULAR | Status: AC | PRN
  Administered 2013-02-26 (×4): 1 mg via INTRAVENOUS

## 2013-02-26 MED ORDER — GELATIN ABSORBABLE 12-7 MM EX MISC
CUTANEOUS | Status: AC
  Filled 2013-02-26: qty 1

## 2013-02-26 MED ORDER — FENTANYL CITRATE 0.05 MG/ML IJ SOLN
INTRAMUSCULAR | Status: AC
  Filled 2013-02-26: qty 6

## 2013-02-26 MED ORDER — IOHEXOL 300 MG/ML  SOLN
50.0000 mL | Freq: Once | INTRAMUSCULAR | Status: AC | PRN
  Administered 2013-02-26: 10 mL via INTRAVENOUS

## 2013-02-26 MED ORDER — SODIUM CHLORIDE 0.9 % IV SOLN
INTRAVENOUS | Status: AC
  Administered 2013-02-26: 10:00:00 via INTRAVENOUS

## 2013-02-26 MED ORDER — DEXTROSE 5 % IV SOLN
3.0000 g | Freq: Once | INTRAVENOUS | Status: AC
  Administered 2013-02-26: 3 g via INTRAVENOUS
  Filled 2013-02-26: qty 3000

## 2013-02-26 MED ORDER — HEPARIN SOD (PORK) LOCK FLUSH 100 UNIT/ML IV SOLN: 500.0000 [IU] | INTRAVENOUS | Status: DC | PRN

## 2013-02-26 NOTE — Telephone Encounter (Signed)
Mother is aware of the pts inj being moved from 04/10/13 to 04/12/13 @ 11:45am...td

## 2013-02-26 NOTE — Procedures (Signed)
S/P T 10 and T 11 VP with biopsies.

## 2013-02-26 NOTE — ED Notes (Signed)
Pt to receive runs of K+ prior to Ancef 3g IV per Dr. Corliss Skains. Potassium is 2.9.

## 2013-02-26 NOTE — Telephone Encounter (Signed)
Late entry form 11/4----per POF I have scheduled appt for 12/19. I have tried to scheduled appt for 11/18, but no available until 11/20, advised scheduler.

## 2013-02-26 NOTE — Progress Notes (Signed)
Right PAC accessed with #20g Huber needle per protocol per Margaret Pyle, RN; pt. Tolerated well

## 2013-02-26 NOTE — ED Notes (Signed)
Patient denies pain and is resting comfortably.  

## 2013-02-26 NOTE — H&P (Signed)
[name] is an 47 y.o. female.   Chief Complaint: severe back pain x 2 months Denies injury although moved in bed and felt sudden onset pain MRI 02/05/13 reveals acute fractures of thoracic 10 and 11 Scheduled now for vertebroplasty/kyphoplasty with possible biopsy and ablation  Pt has hx Uterine Ca dx 2010 and recurrence 2014  HPI: morbid obese; Ut Ca; ascites  Past Medical History  Diagnosis Date  . Obesity   . DVT (deep venous thrombosis) 2009  . PONV (postoperative nausea and vomiting)   . Swelling     BOTH LEGS  . Rash     LOWER ABD  . Gallstones   . Ascites   . Uterine cancer     Past Surgical History  Procedure Laterality Date  . Ankle surgery  1950  . Wisdom tooth extraction    . Tonsillectomy and adenoidectomy    . Hysteroscopy w/d&c    . Abdominal hysterectomy  12/2009    [facility], BSO  . Laparoscopy N/A 10/02/2012    Procedure: LAPAROSCOPY DIAGNOSTIC, perocentisis, omental biopsy;  Surgeon: Wilmon Arms. Tsuei, MD;  Location: [facility];  Service: General;  Laterality: N/A;  removed a total of 15,000 of acities  . Paracentesis      Family History  Problem Relation Age of Onset  . Heart disease Father   . Diabetes Brother   . Heart disease Mother    Social History:  reports that she has never smoked. She does not have any smokeless tobacco history on file. She reports that she drinks alcohol. She reports that she does not use illicit drugs.  Allergies: No Known Allergies   (Not in a hospital admission)  Results for orders placed during the hospital encounter of 02/26/13 (from the past 48 hour(s))  APTT     Status: Abnormal   Collection Time    02/26/13  6:55 AM      Result Value Range   aPTT 21 (*) 24 - 37 seconds  CBC     Status: Abnormal (Preliminary result)   Collection Time    02/26/13  6:55 AM      Result Value Range   WBC PENDING  4.0 - 10.5 K/uL   RBC 3.07 (*) 3.87 - 5.11 MIL/uL   Hemoglobin 11.1 (*) 12.0 - 15.0 g/dL   HCT 16.1 (*) 09.6 - 04.5 %    MCV 103.9 (*) 78.0 - 100.0 fL   MCH 36.2 (*) 26.0 - 34.0 pg   MCHC 34.8  30.0 - 36.0 g/dL   RDW 40.9 (*) 81.1 - 91.4 %   Platelets 155  150 - 400 K/uL  COMPREHENSIVE METABOLIC PANEL     Status: Abnormal   Collection Time    02/26/13  6:55 AM      Result Value Range   Sodium 139  135 - 145 mEq/L   Potassium 2.9 (*) 3.5 - 5.1 mEq/L   Chloride 95 (*) 96 - 112 mEq/L   CO2 24  19 - 32 mEq/L   Glucose, Bld 135 (*) 70 - 99 mg/dL   BUN 16  6 - 23 mg/dL   Creatinine, Ser 7.82  0.50 - 1.10 mg/dL   Calcium 9.5  8.4 - 95.6 mg/dL   Total Protein 6.3  6.0 - 8.3 g/dL   Albumin 3.6  3.5 - 5.2 g/dL   AST 36  0 - 37 U/L   ALT 112 (*) 0 - 35 U/L   Alkaline Phosphatase 152 (*) 39 -  117 U/L   Total Bilirubin 0.7  0.3 - 1.2 mg/dL   GFR calc non Af Amer >90  >90 mL/min   GFR calc Af Amer >90  >90 mL/min   Comment: (NOTE)     The eGFR has been calculated using the CKD EPI equation.     This calculation has not been validated in all clinical situations.     eGFR's persistently <90 mL/min signify possible Chronic Kidney     Disease.  PROTIME-INR     Status: None   Collection Time    02/26/13  6:55 AM      Result Value Range   Prothrombin Time 13.8  11.6 - 15.2 seconds   INR 1.08  0.00 - 1.49   No results found.  Review of Systems  Constitutional: Negative for fever, chills and weight loss.  Respiratory: Negative for cough and shortness of breath.   Cardiovascular: Negative for chest pain.  Gastrointestinal: Negative for nausea, vomiting and abdominal pain.  Musculoskeletal: Positive for back pain and joint pain.  Neurological: Positive for weakness. Negative for dizziness and headaches.  Psychiatric/Behavioral: Negative for memory loss and substance abuse.    Blood pressure 132/83, pulse 126, temperature 97.6 F (36.4 C), temperature source Oral, resp. rate 18, height 5\' 11"  (1.803 m), weight 424 lb (192.325 kg), SpO2 99.00%. Physical Exam  Constitutional: She is oriented to person,  place, and time. She appears well-nourished.  Morbid obese  Cardiovascular: Normal rate and regular rhythm.   No murmur heard. Respiratory: Effort normal and breath sounds normal. She has no wheezes.  GI: Soft. Bowel sounds are normal. She exhibits distension. There is no tenderness.  Musculoskeletal: Normal range of motion. She exhibits edema.  Neurological: She is alert and oriented to person, place, and time.  Uses walker/wc to ambulate  Skin: Skin is warm and dry.  Psychiatric: She has a normal mood and affect. Her behavior is normal. Thought content normal.     Assessment/Plan Back pain T10 and T11 fx Scheduled now for T10 and T11 VP/KP and possible ablation Pt aware of procedure beneifts and risks and agreeable to proceed Consent signed and in chart  Hx Ut Ca  Wade Sigala A 02/26/2013, 8:26 AM

## 2013-02-26 NOTE — Telephone Encounter (Signed)
Called [name] regarding labs that resulted on Tuesday.  [name] had been called by Lorenza Evangelist RN regarding labs on Tuesday, 11/4.  [name]'s labs indicated that she was dehydrated.  I prescribed her potassium BID and recommended repeat labs and possibly IV fluids on Thursday 02/26/13 after kyphoplasty.  Reviewed this with the [name]'s mother, [name].  She is concerned due to the patients weight that she won't feel like getting out of the car again after leaving the kyphoplasty at [facility].  I again recommended they come in and have lab work done today after the kyphoplasty to re-evaluate her electrolytes and renal function.  They will come after their appointment in interventional radiology at [facility].    Illa Level, NP Medical Oncology [facility] [phone]

## 2013-02-26 NOTE — Telephone Encounter (Signed)
sw pt mother gv lab appt for today @ 10:45am...td

## 2013-02-27 ENCOUNTER — Telehealth: Payer: Self-pay | Admitting: Oncology

## 2013-02-27 NOTE — Telephone Encounter (Signed)
Pt mom called to confirm upcoming appts. Confirmed w/mom appts for 11/18, 11/20 and 11/21. Mom aware 11/19 cx'd due to tx will be 11/20 instead on 11/18. Mom will get schedule 11/18.

## 2013-03-02 ENCOUNTER — Telehealth: Payer: Self-pay | Admitting: Radiology

## 2013-03-02 ENCOUNTER — Other Ambulatory Visit (HOSPITAL_COMMUNITY): Payer: Self-pay | Admitting: Interventional Radiology

## 2013-03-02 ENCOUNTER — Other Ambulatory Visit: Payer: Self-pay | Admitting: Adult Health

## 2013-03-02 DIAGNOSIS — S22000S Wedge compression fracture of unspecified thoracic vertebra, sequela: Secondary | ICD-10-CM

## 2013-03-02 NOTE — Progress Notes (Signed)
Patient has called today with c/o lower back pain severe and same as prior to procedure. She states she has been using ice and has been laying in bed mainly because it hurst to bad to stand. She is still on her home medications including percocet QHS and flexeril. She is concerned that the location of the pain is not where the procedure was performed. I have spoke with Dr. Corliss Skains who has instructed the patient to discontinue ice and start using heat as well as start NSAID regimen x 2 days and call our office back.   Pattricia Boss PA-C Interventional Radiology  03/02/2013 1:52 PM

## 2013-03-10 ENCOUNTER — Other Ambulatory Visit (HOSPITAL_BASED_OUTPATIENT_CLINIC_OR_DEPARTMENT_OTHER): Payer: BC Managed Care – PPO | Admitting: Lab

## 2013-03-10 ENCOUNTER — Telehealth: Payer: Self-pay | Admitting: Family

## 2013-03-10 ENCOUNTER — Telehealth: Payer: Self-pay | Admitting: *Deleted

## 2013-03-10 ENCOUNTER — Ambulatory Visit (HOSPITAL_COMMUNITY)
Admission: RE | Admit: 2013-03-10 | Discharge: 2013-03-10 | Disposition: A | Discharge status: Home / Self Care | Payer: BC Managed Care – PPO | Source: Ambulatory Visit | Attending: Family | Admitting: Family

## 2013-03-10 ENCOUNTER — Ambulatory Visit (HOSPITAL_BASED_OUTPATIENT_CLINIC_OR_DEPARTMENT_OTHER): Payer: BC Managed Care – PPO | Admitting: Family

## 2013-03-10 ENCOUNTER — Encounter: Payer: Self-pay | Admitting: Family

## 2013-03-10 ENCOUNTER — Other Ambulatory Visit: Payer: BC Managed Care – PPO | Admitting: Lab

## 2013-03-10 ENCOUNTER — Ambulatory Visit: Payer: BC Managed Care – PPO | Admitting: Adult Health

## 2013-03-10 VITALS — BP 124/80 | HR 121 | Temp 98.1°F | Resp 20 | Ht 71.0 in | Wt >= 6400 oz

## 2013-03-10 DIAGNOSIS — C549 Malignant neoplasm of corpus uteri, unspecified: Secondary | ICD-10-CM

## 2013-03-10 DIAGNOSIS — B372 Candidiasis of skin and nail: Secondary | ICD-10-CM

## 2013-03-10 DIAGNOSIS — M79609 Pain in unspecified limb: Secondary | ICD-10-CM

## 2013-03-10 DIAGNOSIS — M549 Dorsalgia, unspecified: Secondary | ICD-10-CM

## 2013-03-10 DIAGNOSIS — M79604 Pain in right leg: Secondary | ICD-10-CM

## 2013-03-10 DIAGNOSIS — C541 Malignant neoplasm of endometrium: Secondary | ICD-10-CM

## 2013-03-10 DIAGNOSIS — L03115 Cellulitis of right lower limb: Secondary | ICD-10-CM

## 2013-03-10 LAB — CBC WITH DIFFERENTIAL/PLATELET
Basophils Absolute: 0.1 10*3/uL (ref 0.0–0.1)
EOS%: 0.1 % (ref 0.0–7.0)
Eosinophils Absolute: 0 10*3/uL (ref 0.0–0.5)
LYMPH%: 15.9 % (ref 14.0–49.7)
MCH: 36.2 pg — ABNORMAL HIGH (ref 25.1–34.0)
MCHC: 33.5 g/dL (ref 31.5–36.0)
MCV: 108.1 fL — ABNORMAL HIGH (ref 79.5–101.0)
MONO%: 9.7 % (ref 0.0–14.0)
Platelets: 259 10*3/uL (ref 145–400)
RBC: 2.59 10*6/uL — ABNORMAL LOW (ref 3.70–5.45)
RDW: 22.9 % — ABNORMAL HIGH (ref 11.2–14.5)

## 2013-03-10 LAB — COMPREHENSIVE METABOLIC PANEL (CC13)
AST: 17 U/L (ref 5–34)
Albumin: 2.7 g/dL — ABNORMAL LOW (ref 3.5–5.0)
Alkaline Phosphatase: 155 U/L — ABNORMAL HIGH (ref 40–150)
Anion Gap: 17 mEq/L — ABNORMAL HIGH (ref 3–11)
BUN: 10.9 mg/dL (ref 7.0–26.0)
Creatinine: 0.7 mg/dL (ref 0.6–1.1)
Glucose: 172 mg/dl — ABNORMAL HIGH (ref 70–140)
Potassium: 3.1 mEq/L — ABNORMAL LOW (ref 3.5–5.1)
Sodium: 139 mEq/L (ref 136–145)
Total Bilirubin: 0.61 mg/dL (ref 0.20–1.20)
Total Protein: 6 g/dL — ABNORMAL LOW (ref 6.4–8.3)

## 2013-03-10 MED ORDER — NYSTATIN 100000 UNIT/GM EX OINT: 1.0000 "application " | TOPICAL_OINTMENT | Freq: Two times a day (BID) | CUTANEOUS | Status: DC

## 2013-03-10 MED ORDER — FLUCONAZOLE 100 MG PO TABS: 100.0000 mg | ORAL_TABLET | Freq: Every day | ORAL | Status: DC

## 2013-03-10 MED ORDER — CLINDAMYCIN HCL 300 MG PO CAPS: 300.0000 mg | ORAL_CAPSULE | Freq: Three times a day (TID) | ORAL | Status: DC

## 2013-03-10 NOTE — Progress Notes (Signed)
*  Preliminary Results* Bilateral lower extremity venous duplex completed. Study was very technically limited due to patient body habitus. Visualized veins of bilateral lower extremities are negative for deep vein thrombosis. There is no evidence of Baker's cyst bilaterally.  Attempted to call preliminary results to Larina Bras, NP, however there was no answer. Patient was discharged and can be reached by phone if necessary.  03/10/2013  Gertie Fey, RVT, RDCS, RDMS

## 2013-03-10 NOTE — Patient Instructions (Addendum)
Please contact us at [phone] if you have any questions or concerns.  Get plenty of rest, drink plenty of water, exercise daily (chair exercises as tolerated), eat a balanced diet.  Finish entire course of antibiotic therapy.  Take probiotics while on antibiotics.  Results for orders placed in visit on 03/10/13 (from the past 24 hour(s))  CBC WITH DIFFERENTIAL     Status: Abnormal   Collection Time    03/10/13 11:16 AM      Result Value Range   WBC 9.0  3.9 - 10.3 10e3/uL   NEUT# 6.5  1.5 - 6.5 10e3/uL   HGB 9.4 (*) 11.6 - 15.9 g/dL   HCT 16.1 (*) 09.6 - 04.5 %   Platelets 259  145 - 400 10e3/uL   MCV 108.1 (*) 79.5 - 101.0 fL   MCH 36.2 (*) 25.1 - 34.0 pg   MCHC 33.5  31.5 - 36.0 g/dL   RBC 4.09 (*) 8.11 - 9.14 10e6/uL   RDW 22.9 (*) 11.2 - 14.5 %   lymph# 1.4  0.9 - 3.3 10e3/uL   MONO# 0.9  0.1 - 0.9 10e3/uL   Eosinophils Absolute 0.0  0.0 - 0.5 10e3/uL   Basophils Absolute 0.1  0.0 - 0.1 10e3/uL   NEUT% 72.9  38.4 - 76.8 %   LYMPH% 15.9  14.0 - 49.7 %   MONO% 9.7  0.0 - 14.0 %   EOS% 0.1  0.0 - 7.0 %   BASO% 1.4  0.0 - 2.0 %   Narrative:    Performed At:  [facility]               [address]               [place], [place] [address]  COMPREHENSIVE METABOLIC PANEL (CC13)     Status: Abnormal   Collection Time    03/10/13 11:16 AM      Result Value Range   Sodium 139  136 - 145 mEq/L   Potassium 3.1 (*) 3.5 - 5.1 mEq/L   Chloride 100  98 - 109 mEq/L   CO2 23  22 - 29 mEq/L   Glucose 172 (*) 70 - 140 mg/dl   BUN 62.1  7.0 - 30.8 mg/dL   Creatinine 0.7  0.6 - 1.1 mg/dL   Total Bilirubin 6.57  0.20 - 1.20 mg/dL   Alkaline Phosphatase 155 (*) 40 - 150 U/L   AST 17  5 - 34 U/L   ALT 43  0 - 55 U/L   Total Protein 6.0 (*) 6.4 - 8.3 g/dL   Albumin 2.7 (*) 3.5 - 5.0 g/dL   Calcium 9.1  8.4 - 84.6 mg/dL   Anion Gap 17 (*) 3 - 11 mEq/L   Narrative:    Performed At:  [facility]               [address]               [place], [place]  [address]

## 2013-03-10 NOTE — Telephone Encounter (Signed)
gv pre cert order for doppler to [facility]...td

## 2013-03-10 NOTE — Progress Notes (Addendum)
[facility]  Telephone:([phone]) [phone] Fax:(336) [phone]  OFFICE PROGRESS NOTE    PATIENT: [name]   DOB: [birth year]  MR#: [id number]  JYN#:[id number]   QM:VHQION, Jeannett Senior, MD GYN ONC: Laurette Schimke, M.D. SU:  Manus Rudd, M.D.    DIAGNOSIS: JOSCELIN FRAY is a 47 y.o. female with a prior history of stage IA uterine cancer diagnosed in 2010.  Now with recurrent disease diagnosed in 08/2012.  PRIOR THERAPY: #1  Patient developed excessive uterine bleeding.  She underwent dilatation and curettage with hysteroscopy. 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 Due to morbid obesity Mirena IUD was placed along with Aygestin. Patient had a dramatic directed weight loss with nutritional support she went from 523 pounds to 359 pounds in 10/2009.  On 12/27/2009 she underwent a robotic-assisted laparoscopic hysterectomy, bilateral salpingo-oophorectomy, and mini laparotomy through the umbilical port with morcellation of uterus within about the delivery of the uterus. The final pathology revealed an endometrial adenocarcinoma grade 2 with invasion limited to 1 mm of the myometrium.  #2  Patient continued to do well until 09/2012 when she developed right upper quadrant pain. It was presumed to be cholelithiasis. 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 Omental biopsies were obtained. The pathology was consistent with metastatic adenocarcinoma with the presumption of recurrent endometrial carcinoma.  She was seen by Dr. Laurette Schimke on 10/07/2012.  Dr. Nelly Rout recommended that [name] undergo chemotherapy consisting of Taxol and Carboplatinum.  Chemotherapy started on 11/04/2012.   #3 Abdominal ascites with multiple paracenteses.  #4 Back pain and subsequent MRI on 02/05/2013 revealed compression fractures of T10 and T11.    #5 Status post kyphoplasty with Dr. Julieanne Cotton on 02/26/2013.  Patient states her back pain continues.   CURRENT THERAPY:  Taxol/Carboplatin given every 21 days with 8 cycles planned   INTERVAL HISTORY: [name] returns today for follow up prior to cycle #7 of chemotherapy consisting of Taxol/Carboplatin scheduled for 03/12/2013.  She continues to be very uncomfortable due to her compression fractures of T10 and T11 despite undergoing kyphoplasty on 02/26/2013.  She states the pain in her lower back continues.  Her pain is only decreased by laying down in her bed..  She  has complaints of having a "knot" on her right lower extremity that is warm to the touch.  Her mother states that she has wounds in her inguinal area and under her breast.  [name] has complaints of hot flashes.  She also states she has had a nonproductive cough for the last 3-4 days.  We discussed the possibility of her having a sleep study and wearing a CPAP if she is found to have sleep apnea.  The patient is reluctant to do so and states she had a sleep apnea conversation with her primary care physician.  She denies any other symptomatology.  Her interval history is otherwise stable.   PAST MEDICAL HISTORY: Past Medical History  Diagnosis Date  . Obesity   . DVT (deep venous thrombosis) 2009  . PONV (postoperative nausea and vomiting)   . Swelling     BOTH LEGS  . Rash     LOWER ABD  . Gallstones   . Ascites   . Uterine cancer     PAST SURGICAL HISTORY: Past Surgical History  Procedure Laterality Date  . Ankle surgery  1950  . Wisdom tooth extraction    .  Tonsillectomy and adenoidectomy    . Hysteroscopy w/d&c    . Abdominal hysterectomy  12/2009    [facility], [facility]  . Laparoscopy N/A 10/02/2012    Procedure: LAPAROSCOPY DIAGNOSTIC, perocentisis, omental biopsy;  Surgeon: Wilmon Arms. Tsuei, MD;  Location: [facility];  Service: General;  Laterality: N/A;  removed a total of 15,000 of acities  . Paracentesis     . Kyphoplasty  02/26/2013    FAMILY HISTORY: Family History  Problem Relation Age of Onset  . Heart disease Father   . Diabetes Brother   . Heart disease Mother     SOCIAL HISTORY: History  Substance Use Topics  . Smoking status: Never Smoker   . Smokeless tobacco: Never Used  . Alcohol Use: Yes     Comment: social, Occassionally    ALLERGIES: No Known Allergies   MEDICATIONS:  Current Outpatient Prescriptions  Medication Sig Dispense Refill  . Ascorbic Acid (VITAMIN C) 1000 MG tablet Take 1,000 mg by mouth daily.      Marland Kitchen CARAFATE 1 GM/10ML suspension TAKE 10 MLS BY MOUTH 4 TIMES DAILY  420 mL  2  . CARBOPLATIN IV Inject 1 each into the vein every 21 ( twenty-one) days.      . Cyanocobalamin (VITAMIN B-12 PO) Take 2,500 mg by mouth daily.       . cyclobenzaprine (FLEXERIL) 5 MG tablet Take 1 tablet (5 mg total) by mouth 3 (three) times daily as needed for muscle spasms.  60 tablet  1  . ferrous sulfate 325 (65 FE) MG tablet TAKE 1 TABLET BY MOUTH 3 TIMES DAILY WITH MEALS  90 tablet  0  . folic acid (FOLVITE) 400 MCG tablet Take 400 mcg by mouth daily.        Marland Kitchen lidocaine-prilocaine (EMLA) cream Apply topically as needed. Apply to port-a-cath site 1-2 hours prior to treatment.  30 g  2  . loratadine (CLARITIN) 10 MG tablet Take 10 mg by mouth daily.      Marland Kitchen LORazepam (ATIVAN) 0.5 MG tablet Take 1 tablet (0.5 mg total) by mouth every 6 (six) hours as needed (Nausea or vomiting).  60 tablet  0  . magnesium gluconate (MAGONATE) 500 MG tablet Take 500 mg by mouth daily.      . Multiple Vitamins-Minerals (MULTIVITAMIN WITH MINERALS) tablet Take 1 tablet by mouth daily.       . ondansetron (ZOFRAN) 8 MG tablet Take 1 tablet (8 mg total) by mouth 2 (two) times daily. Take two times a day starting the day after chemo for 3 days. Then take two times a day as needed for nausea or vomiting.  30 tablet  1  . oxyCODONE-acetaminophen (PERCOCET) 10-325 MG per tablet Take 1 tablet by mouth every  4 (four) hours as needed for pain.  90 tablet  0  . PACLitaxel (TAXOL IV) Inject 1 each into the vein every 21 ( twenty-one) days.      . pantoprazole (PROTONIX) 40 MG tablet Take 1 tablet (40 mg total) by mouth daily.  30 tablet  6  . pegfilgrastim (NEULASTA) 6 MG/0.6ML injection Inject 6 mg into the skin every 21 ( twenty-one) days.      . potassium chloride (K-DUR) 10 MEQ tablet Take 2 tablets (20 mEq total) by mouth 2 (two) times daily.  12 tablet  1  . potassium chloride SA (K-DUR,KLOR-CON) 20 MEQ tablet Take 2 tablets (40 mEq total) by mouth 2 (two) times daily. For 7 days.  14 tablet  3  . PRESCRIPTION MEDICATION every 21 ( twenty-one) days. CHEMOTHERAPY REGIMEN      . prochlorperazine (COMPAZINE) 10 MG tablet Take 1 tablet (10 mg total) by mouth every 6 (six) hours as needed (Nausea or vomiting).  30 tablet  1  . promethazine (PHENERGAN) 12.5 MG tablet Take 1 tablet (12.5 mg total) by mouth every 6 (six) hours as needed for nausea.  30 tablet  0  . triamterene-hydrochlorothiazide (MAXZIDE-25) 37.5-25 MG per tablet Take 1 tablet by mouth every morning.       Marland Kitchen UNABLE TO FIND Heavy duty rolling walker  Diagnosis: Endometrial cancer 182.0; Ascites 789.59; severe fatigue 780.79  1 Units  0  . UNABLE TO FIND Super heavy duty wheelchair with anit-tippers and leg rests with heel loops  Diagnosis: Endometrial cancer 182.0; Ascites 789.59; severe fatigue 780.79, thoracic compression fractures 733.13  1 Units  0   No current facility-administered medications for this visit.      REVIEW OF SYSTEMS: A 10 point review of systems was completed and is negative except as noted above.  [name] denies any other symptomatology including  fatigue, fever or chills, headache, vision changes, swollen glands, shortness of breath, chest pain or discomfort, nausea, vomiting, diarrhea, constipation, change in urinary or bowel habits, any other arthralgias/myalgias, unusual bleeding/bruising or any other  symptomatology.   PHYSICAL EXAMINATION: BP 124/80  Pulse 121  Temp(Src) 98.1 F (36.7 C) (Oral)  Resp 20  Ht 5\' 11"  (1.803 m)  Wt 435 lb 3.2 oz (197.405 kg)  BMI 60.72 kg/m2   ECOG FS:  3 - Symptomatic, >50% confined to bed  General appearance: Alert, cooperative, well nourished, morbidly obese, mild distress  Head: Normocephalic, without obvious abnormality, atraumatic  Eyes: Conjunctivae/corneas clear, PERRLA, EOMI  Nose: Nares, septum and mucosa are normal, no drainage or sinus tenderness  Neck: Excessive habitus, supple, symmetrical, trachea midline, no tenderness  Resp: Clear to auscultation bilaterally, no wheezes/rales/rhonchi, diminished bibasilar breath sounds  Cardio: Regular rate and rhythm, S1, S2 normal, no murmur, click, rub or gallop, no edema, right chest Port-A-Cath without signs of infection  GI: Soft, distended, non-tender, hypoactive bowel sounds, excessive habitus  Skin: Bilateral lower extremity venous stasis with hyperpigmentation, bilateral breast inframammary area and bilateral inguinal area with cutaneous candidiasis, right lower extremity appears to have cellulitis and is warm to the touch M/S: Atraumatic, limited range of motion and strength in lower extremities, no clubbing  Neurologic: Grossly normal, cranial nerves II through XII intact, alert and oriented x 3  Psych: Appropriate affect although visibly frustrated at times   LAB RESULTS: Lab Results  Component Value Date   WBC 9.0 03/10/2013   NEUTROABS 6.5 03/10/2013   HGB 9.4* 03/10/2013   HCT 28.0* 03/10/2013   MCV 108.1* 03/10/2013   PLT 259 03/10/2013      Chemistry      Component Value Date/Time   NA 139 03/10/2013 1116   NA 139 02/26/2013 0655   K 3.1* 03/10/2013 1116   K 2.9* 02/26/2013 0655   CL 95* 02/26/2013 0655   CL 98 10/07/2012 0923   CO2 23 03/10/2013 1116   CO2 24 02/26/2013 0655   BUN 10.9 03/10/2013 1116   BUN 16 02/26/2013 0655   CREATININE 0.7 03/10/2013 1116    CREATININE 0.66 02/26/2013 0655      Component Value Date/Time   CALCIUM 9.1 03/10/2013 1116   CALCIUM 9.5 02/26/2013 0655   ALKPHOS 155* 03/10/2013 1116   ALKPHOS 152* 02/26/2013  0655   AST 17 03/10/2013 1116   AST 36 02/26/2013 0655   ALT 43 03/10/2013 1116   ALT 112* 02/26/2013 0655   BILITOT 0.61 03/10/2013 1116   BILITOT 0.7 02/26/2013 0655       No results found for this basename: LABCA2    No components found with this basename: [id number]     RADIOGRAPHIC STUDIES: Ir Vertebroplasty Or Sacroplasty 03/02/2013   CLINICAL DATA:  Patient with severely painful compression fracture at T10 and T11. History of endometrial carcinoma. On chemotherapy.  EXAM: VERTEBROPLASTY FL AT T10 and T11.  CORE BIOPSIES OBTAINED  MEDICATIONS: Versed 4 mg. mg IV, Fentanyl 100  mcg IV.  ANESTHESIA/SEDATION: Total Moderate Sedation Time:  35 min.  FLUOROSCOPY TIME:  8 min 48 seconds. Marland Kitchen  PROCEDURE: Following a full explanation of the procedure along with the potentially associated complications, a witnessed informed consent was obtained.  The patient was placed prone on the fluoroscopic table. Nasal oxygen was administered. Physiologic monitoring was performed throughout the duration of the procedure. The skin overlying the thoracic region was prepped and draped in the usual sterile fashion. The T10 and T11 vertebral bodies were identified and the right pedicle at T10, and the left pedicle at T11 were infiltrated with 0.25% Bupivacaine. This was then followed by the advancement of a 13-gauge Cook needle through both the pedicles into the anterior one-third at both levels. Prior to this, 18 gauge core biopsy needles were used to obtain 4 samples of both the levels using a 20 mL syringe. These were sent for pathologic analysis. A gentle contrast injection demonstrated a trabecular pattern of contrast.  At this time, methylmethacrylate mixture was reconstituted. Under biplane intermittent fluoroscopy, the  methylmethacrylate was then injected into the T10 and T11 vertebral body with filling of the vertebral body at T10 and T12.  Mild extravasation was noted into the disk spaces through the inferior endplate fracture clefts . No epidural venous contamination was seen.  The needles were then removed. Hemostasis was achieved at the skin entry site.  There were no acute complications. Patient tolerated the procedure well. The patient was observed for 3 hours and discharged in good condition.  IMPRESSION: Status post vertebral body augmentation for painful compression fracture at T10 and T11 using vertebroplasty technique.  Core biopsies obtained at T10 and T11. Sample sent for pathologic analysis.   Electronically Signed   By: Julieanne Cotton M.D.   On: 02/26/2013 13:10      ASSESSMENT: DUNYA MEINERS is a 47 y.o. woman:  #1 Recurrent uterine cancer with peritoneal carcinomatosis and ascites.  Port a cath was placed in the OR, and the patient has been treated in the palliative setting with Taxol and Carboplatinum that started on 11/04/2012.    #2 Ascites: History of having paracenteses 1-2 times per week, she hasn't received/required a paracentesis since 11/24/2012.  #3 Back pain - Compression fractures at T10 and T11 per MRI on 02/05/2013.  Status post kyphoplasty on 02/26/2013.  #4 Hypokalemia - Continue potassium chloride as prescribed.  #5 Right lower extremity swelling and redness  #6 Cutaneous candidiasis under breasts and bilateral inguinal areas   PLAN: #1 She is currently scheduled to start cycle #7 of palliative chemotherapy consisting of Taxol and Carboplatin on 03/12/2013.   #2 Chronic back pain - she will followup with Dr. Corliss Skains  regarding recent kyphoplasty and continued back pain.  We will refer her to Dr. Newell Coral for a consultation. Home physical therapy has been  requested for her due to inability to ambulate, shortness of breath, and excruciating back pain.   #3 A signed  prescription for specialized wheelchair was also sent to the patient's insurance company today. The patient has not been able to ambulate since her diagnosis. Home health physical therapy has been initiated for transfer skills and to make her safe at work and to facilitate ambulation once more. [name] has a medical condition causing mobility impairment and she is unable to safely ambulate with a cane or walker. She has expressed a willingness to use a wheelchair and has sufficient upper body strength to self propel the wheelchair. She would benefit from having a manual wheelchair to perform ADLs at home and at work.   #4 For hypokalemia she will continue to take potassium chloride as prescribed.  #5 A bilateral lower extremity venous duplex has been ordered to rule out DVT as the cause of her right lower extremity pain.  It appears to be cellulitis.  If DVT is ruled out, [name] will proceed to treat for cellulitis.  An electronic prescription for clindamycin 300 mg by mouth 3 times a day for 7 days was sent to her pharmacy.  #6 For cutaneous candidiasis, [name] was asked to take Diflucan 100 mg by mouth daily x 3 days and applying nystatin ointment to affected areas twice a day.  An electronic prescription for these medications was sent to her pharmacy.  #7 We plan to see [name] on 03/17/2013 for a nadir visit at which time we will check laboratories of CBC and CMP before proceeding with planned chemotherapy scheduled for 03/31/2013.  She is scheduled to followup with Dr. Nelly Rout on 04/09/2013.   All questions were answered. The patient and her family were encouraged to contact us in the interim with any problems, questions or concerns.    Larina Bras, NP-C 03/10/2013  12:18 PM   ATTENDING'S ATTESTATION:  I personally reviewed patient's chart, examined patient myself, formulated the treatment plan as followed.   Very complicated situation. Patient with metastatic uterine  cancer with peritoneal carcinomatosis and ascites. She's been receiving chemotherapy consisting of Taxol carboplatinum. She has tolerated this relatively well.  Patient also with history of chronic back pain worsening over the last few months. She has had kyphoplasty performed but in spite of that she continues to have severe back pain. We are recommending that she undergo physical therapy. It may be important that she be seen by neurosurgery as well to see if there is anything else that can be done. I have encouraged her to continue her followup with Dr. Mertie Clause in interventional radiology.  Remainder as above  Drue Second, MD Medical/Oncology [facility] [phone] (beeper) [phone] (Office)  03/25/2013, 9:34 AM

## 2013-03-10 NOTE — Telephone Encounter (Signed)
Notified [name]'s mother [name] that bilateral venous duplex completed today did not reveal any blood clots (DVT).  [name] stated she understood and would relay the results to [name].

## 2013-03-10 NOTE — Telephone Encounter (Signed)
appts made and printed. Scheduled the doppler gv order to [facility] for pre cert. Lm w/ Dr .Jule Ser office to get the pt scheduled.Pt is already an establish pt w/ [facility] pt/rehab. She stated that she just stop going. She plans to call to re-establish herself...td .

## 2013-03-11 ENCOUNTER — Ambulatory Visit: Payer: BC Managed Care – PPO

## 2013-03-11 ENCOUNTER — Telehealth: Payer: Self-pay | Admitting: *Deleted

## 2013-03-11 ENCOUNTER — Telehealth: Payer: Self-pay | Admitting: Oncology

## 2013-03-11 NOTE — Telephone Encounter (Signed)
sw Shelia from Dr. Jule Ser office she will call the pt with appt....td

## 2013-03-11 NOTE — Telephone Encounter (Signed)
Faxed pt medical records to Neurosurgery

## 2013-03-12 ENCOUNTER — Encounter (HOSPITAL_COMMUNITY): Payer: Self-pay | Admitting: Emergency Medicine

## 2013-03-12 ENCOUNTER — Encounter: Payer: Self-pay | Admitting: Family

## 2013-03-12 ENCOUNTER — Emergency Department (HOSPITAL_COMMUNITY): Payer: BC Managed Care – PPO

## 2013-03-12 ENCOUNTER — Other Ambulatory Visit: Payer: Self-pay

## 2013-03-12 ENCOUNTER — Ambulatory Visit: Payer: BC Managed Care – PPO

## 2013-03-12 ENCOUNTER — Inpatient Hospital Stay (HOSPITAL_COMMUNITY)
Admission: EM | Admit: 2013-03-12 | Discharge: 2013-03-16 | DRG: 917 | Disposition: A | Discharge status: Home Health | Payer: BC Managed Care – PPO | Attending: Internal Medicine | Admitting: Internal Medicine

## 2013-03-12 DIAGNOSIS — R0609 Other forms of dyspnea: Secondary | ICD-10-CM

## 2013-03-12 DIAGNOSIS — T40601A Poisoning by unspecified narcotics, accidental (unintentional), initial encounter: Principal | ICD-10-CM | POA: Diagnosis present

## 2013-03-12 DIAGNOSIS — R0603 Acute respiratory distress: Secondary | ICD-10-CM

## 2013-03-12 DIAGNOSIS — T4271XA Poisoning by unspecified antiepileptic and sedative-hypnotic drugs, accidental (unintentional), initial encounter: Secondary | ICD-10-CM | POA: Diagnosis present

## 2013-03-12 DIAGNOSIS — G4733 Obstructive sleep apnea (adult) (pediatric): Secondary | ICD-10-CM | POA: Diagnosis present

## 2013-03-12 DIAGNOSIS — K8010 Calculus of gallbladder with chronic cholecystitis without obstruction: Secondary | ICD-10-CM

## 2013-03-12 DIAGNOSIS — I4891 Unspecified atrial fibrillation: Secondary | ICD-10-CM

## 2013-03-12 DIAGNOSIS — R000 Tachycardia, unspecified: Secondary | ICD-10-CM | POA: Diagnosis present

## 2013-03-12 DIAGNOSIS — T400X1A Poisoning by opium, accidental (unintentional), initial encounter: Secondary | ICD-10-CM | POA: Diagnosis present

## 2013-03-12 DIAGNOSIS — I8000 Phlebitis and thrombophlebitis of superficial vessels of unspecified lower extremity: Secondary | ICD-10-CM | POA: Diagnosis present

## 2013-03-12 DIAGNOSIS — G9340 Encephalopathy, unspecified: Secondary | ICD-10-CM

## 2013-03-12 DIAGNOSIS — Z66 Do not resuscitate: Secondary | ICD-10-CM | POA: Diagnosis present

## 2013-03-12 DIAGNOSIS — E669 Obesity, unspecified: Secondary | ICD-10-CM | POA: Diagnosis present

## 2013-03-12 DIAGNOSIS — K625 Hemorrhage of anus and rectum: Secondary | ICD-10-CM

## 2013-03-12 DIAGNOSIS — Z79899 Other long term (current) drug therapy: Secondary | ICD-10-CM

## 2013-03-12 DIAGNOSIS — R188 Other ascites: Secondary | ICD-10-CM

## 2013-03-12 DIAGNOSIS — R4182 Altered mental status, unspecified: Secondary | ICD-10-CM

## 2013-03-12 DIAGNOSIS — L02419 Cutaneous abscess of limb, unspecified: Secondary | ICD-10-CM | POA: Diagnosis present

## 2013-03-12 DIAGNOSIS — I498 Other specified cardiac arrhythmias: Secondary | ICD-10-CM | POA: Diagnosis present

## 2013-03-12 DIAGNOSIS — D509 Iron deficiency anemia, unspecified: Secondary | ICD-10-CM

## 2013-03-12 DIAGNOSIS — Z9221 Personal history of antineoplastic chemotherapy: Secondary | ICD-10-CM

## 2013-03-12 DIAGNOSIS — Z86718 Personal history of other venous thrombosis and embolism: Secondary | ICD-10-CM

## 2013-03-12 DIAGNOSIS — J9600 Acute respiratory failure, unspecified whether with hypoxia or hypercapnia: Secondary | ICD-10-CM

## 2013-03-12 DIAGNOSIS — Z6841 Body Mass Index (BMI) 40.0 and over, adult: Secondary | ICD-10-CM

## 2013-03-12 DIAGNOSIS — D6481 Anemia due to antineoplastic chemotherapy: Secondary | ICD-10-CM | POA: Diagnosis present

## 2013-03-12 DIAGNOSIS — G9341 Metabolic encephalopathy: Secondary | ICD-10-CM | POA: Diagnosis present

## 2013-03-12 DIAGNOSIS — C799 Secondary malignant neoplasm of unspecified site: Secondary | ICD-10-CM

## 2013-03-12 DIAGNOSIS — J9601 Acute respiratory failure with hypoxia: Secondary | ICD-10-CM | POA: Diagnosis present

## 2013-03-12 DIAGNOSIS — A498 Other bacterial infections of unspecified site: Secondary | ICD-10-CM | POA: Diagnosis present

## 2013-03-12 DIAGNOSIS — E860 Dehydration: Secondary | ICD-10-CM | POA: Diagnosis present

## 2013-03-12 DIAGNOSIS — E8779 Other fluid overload: Secondary | ICD-10-CM | POA: Diagnosis not present

## 2013-03-12 DIAGNOSIS — Z9889 Other specified postprocedural states: Secondary | ICD-10-CM

## 2013-03-12 DIAGNOSIS — T887XXA Unspecified adverse effect of drug or medicament, initial encounter: Secondary | ICD-10-CM

## 2013-03-12 DIAGNOSIS — C549 Malignant neoplasm of corpus uteri, unspecified: Secondary | ICD-10-CM

## 2013-03-12 DIAGNOSIS — J9690 Respiratory failure, unspecified, unspecified whether with hypoxia or hypercapnia: Secondary | ICD-10-CM | POA: Diagnosis present

## 2013-03-12 DIAGNOSIS — Z8542 Personal history of malignant neoplasm of other parts of uterus: Secondary | ICD-10-CM

## 2013-03-12 DIAGNOSIS — C786 Secondary malignant neoplasm of retroperitoneum and peritoneum: Secondary | ICD-10-CM | POA: Diagnosis present

## 2013-03-12 DIAGNOSIS — T50905A Adverse effect of unspecified drugs, medicaments and biological substances, initial encounter: Secondary | ICD-10-CM

## 2013-03-12 DIAGNOSIS — R1011 Right upper quadrant pain: Secondary | ICD-10-CM

## 2013-03-12 DIAGNOSIS — T451X5A Adverse effect of antineoplastic and immunosuppressive drugs, initial encounter: Secondary | ICD-10-CM | POA: Diagnosis present

## 2013-03-12 DIAGNOSIS — E662 Morbid (severe) obesity with alveolar hypoventilation: Secondary | ICD-10-CM | POA: Diagnosis present

## 2013-03-12 DIAGNOSIS — C541 Malignant neoplasm of endometrium: Secondary | ICD-10-CM | POA: Diagnosis present

## 2013-03-12 DIAGNOSIS — N390 Urinary tract infection, site not specified: Secondary | ICD-10-CM | POA: Diagnosis present

## 2013-03-12 DIAGNOSIS — E872 Acidosis, unspecified: Secondary | ICD-10-CM | POA: Diagnosis present

## 2013-03-12 DIAGNOSIS — D72829 Elevated white blood cell count, unspecified: Secondary | ICD-10-CM | POA: Diagnosis present

## 2013-03-12 DIAGNOSIS — L03115 Cellulitis of right lower limb: Secondary | ICD-10-CM | POA: Diagnosis present

## 2013-03-12 DIAGNOSIS — C801 Malignant (primary) neoplasm, unspecified: Secondary | ICD-10-CM

## 2013-03-12 DIAGNOSIS — R0902 Hypoxemia: Secondary | ICD-10-CM | POA: Diagnosis present

## 2013-03-12 HISTORY — DX: Iron deficiency anemia, unspecified: D50.9

## 2013-03-12 HISTORY — DX: Family history of other specified conditions: Z84.89

## 2013-03-12 LAB — CBC
HCT: 25.8 % — ABNORMAL LOW (ref 36.0–46.0)
MCH: 36 pg — ABNORMAL HIGH (ref 26.0–34.0)
MCHC: 31.2 g/dL (ref 30.0–36.0)
MCV: 113.6 fL — ABNORMAL HIGH (ref 78.0–100.0)
Platelets: 384 10*3/uL (ref 150–400)
Platelets: 314 10*3/uL (ref 150–400)
RBC: 2.65 MIL/uL — ABNORMAL LOW (ref 3.87–5.11)
RDW: 21.9 % — ABNORMAL HIGH (ref 11.5–15.5)
RDW: 21.5 % — ABNORMAL HIGH (ref 11.5–15.5)
WBC: 15.3 10*3/uL — ABNORMAL HIGH (ref 4.0–10.5)
WBC: 14.5 10*3/uL — ABNORMAL HIGH (ref 4.0–10.5)

## 2013-03-12 LAB — CREATININE, SERUM
Creatinine, Ser: 1.02 mg/dL (ref 0.50–1.10)
GFR calc Af Amer: 76 mL/min — ABNORMAL LOW (ref >90)
GFR calc non Af Amer: 65 mL/min — ABNORMAL LOW (ref >90)

## 2013-03-12 LAB — RAPID URINE DRUG SCREEN, HOSP PERFORMED
Barbiturates: NOT DETECTED
Benzodiazepines: NOT DETECTED
Tetrahydrocannabinol: NOT DETECTED

## 2013-03-12 LAB — COMPREHENSIVE METABOLIC PANEL
ALT: 53 U/L — ABNORMAL HIGH (ref 0–35)
AST: 42 U/L — ABNORMAL HIGH (ref 0–37)
Albumin: 2.6 g/dL — ABNORMAL LOW (ref 3.5–5.2)
CO2: 21 mEq/L (ref 19–32)
Calcium: 8.9 mg/dL (ref 8.4–10.5)
Chloride: 96 mEq/L (ref 96–112)
Creatinine, Ser: 0.89 mg/dL (ref 0.50–1.10)
GFR calc Af Amer: 89 mL/min — ABNORMAL LOW (ref >90)
GFR calc non Af Amer: 77 mL/min — ABNORMAL LOW (ref >90)
Sodium: 138 mEq/L (ref 135–145)
Total Bilirubin: 0.5 mg/dL (ref 0.3–1.2)
Total Protein: 6.3 g/dL (ref 6.0–8.3)

## 2013-03-12 LAB — URINE MICROSCOPIC-ADD ON

## 2013-03-12 LAB — PROTIME-INR
INR: 1.17 (ref 0.00–1.49)
Prothrombin Time: 14.7 seconds (ref 11.6–15.2)

## 2013-03-12 LAB — POCT I-STAT 3, ART BLOOD GAS (G3+)
Bicarbonate: 25.8 mEq/L — ABNORMAL HIGH (ref 20.0–24.0)
O2 Saturation: 93 %
TCO2: 27 mmol/L (ref 0–100)
pCO2 arterial: 45 mmHg (ref 35.0–45.0)
pH, Arterial: 7.367 (ref 7.350–7.450)
pO2, Arterial: 69 mmHg — ABNORMAL LOW (ref 80.0–100.0)

## 2013-03-12 LAB — IRON AND TIBC: UIBC: 160 ug/dL (ref 125–400)

## 2013-03-12 LAB — TSH: TSH: 0.442 u[IU]/mL (ref 0.350–4.500)

## 2013-03-12 LAB — URINALYSIS, ROUTINE W REFLEX MICROSCOPIC
Glucose, UA: NEGATIVE mg/dL
Ketones, ur: NEGATIVE mg/dL
Protein, ur: 100 mg/dL — AB
Specific Gravity, Urine: 1.022 (ref 1.005–1.030)
pH: 5.5 (ref 5.0–8.0)

## 2013-03-12 LAB — ETHANOL: Alcohol, Ethyl (B): <11 mg/dL (ref 0–11)

## 2013-03-12 LAB — RETICULOCYTES: Retic Ct Pct: 5.7 % — ABNORMAL HIGH (ref 0.4–3.1)

## 2013-03-12 LAB — FERRITIN: Ferritin: 1676 ng/mL — ABNORMAL HIGH (ref 10–291)

## 2013-03-12 LAB — TROPONIN I: Troponin I: <0.3 ng/mL (ref <0.30)

## 2013-03-12 LAB — FOLATE: Folate: >20 ng/mL

## 2013-03-12 MED ORDER — MORPHINE SULFATE 2 MG/ML IJ SOLN: 2.0000 mg | INTRAMUSCULAR | Status: DC | PRN

## 2013-03-12 MED ORDER — AMIODARONE LOAD VIA INFUSION
150.0000 mg | Freq: Once | INTRAVENOUS | Status: DC
  Filled 2013-03-12: qty 83.34

## 2013-03-12 MED ORDER — ACETAMINOPHEN 325 MG PO TABS: 650.0000 mg | ORAL_TABLET | Freq: Four times a day (QID) | ORAL | Status: DC | PRN

## 2013-03-12 MED ORDER — NALOXONE HCL 1 MG/ML IJ SOLN
2.0000 mg | Freq: Once | INTRAMUSCULAR | Status: AC
  Administered 2013-03-12: 2 mg via INTRAVENOUS
  Filled 2013-03-12: qty 2

## 2013-03-12 MED ORDER — SODIUM CHLORIDE 0.9 % IJ SOLN
3.0000 mL | Freq: Two times a day (BID) | INTRAMUSCULAR | Status: DC
  Administered 2013-03-12 – 2013-03-15 (×2): 3 mL via INTRAVENOUS

## 2013-03-12 MED ORDER — SODIUM CHLORIDE 0.9 % IV SOLN
INTRAVENOUS | Status: DC
  Administered 2013-03-12: 09:00:00 via INTRAVENOUS
  Administered 2013-03-13 – 2013-03-14 (×2): 20 mL/h via INTRAVENOUS

## 2013-03-12 MED ORDER — PIPERACILLIN-TAZOBACTAM 3.375 G IVPB
3.3750 g | Freq: Three times a day (TID) | INTRAVENOUS | Status: AC
  Administered 2013-03-12 – 2013-03-13 (×4): 3.375 g via INTRAVENOUS
  Filled 2013-03-12 (×7): qty 50

## 2013-03-12 MED ORDER — ACETAMINOPHEN 650 MG RE SUPP: 650.0000 mg | Freq: Four times a day (QID) | RECTAL | Status: DC | PRN

## 2013-03-12 MED ORDER — AMIODARONE HCL IN DEXTROSE 360-4.14 MG/200ML-% IV SOLN
30.0000 mg/h | INTRAVENOUS | Status: DC
  Filled 2013-03-12 (×2): qty 200

## 2013-03-12 MED ORDER — AMIODARONE HCL IN DEXTROSE 360-4.14 MG/200ML-% IV SOLN
60.0000 mg/h | INTRAVENOUS | Status: DC
  Filled 2013-03-12: qty 200

## 2013-03-12 MED ORDER — ONDANSETRON HCL 4 MG/2ML IJ SOLN: 4.0000 mg | Freq: Four times a day (QID) | INTRAMUSCULAR | Status: DC | PRN

## 2013-03-12 MED ORDER — SODIUM CHLORIDE 0.9 % IV SOLN
INTRAVENOUS | Status: AC
  Administered 2013-03-12: 1000 mL via INTRAVENOUS

## 2013-03-12 MED ORDER — ONDANSETRON HCL 4 MG PO TABS: 4.0000 mg | ORAL_TABLET | Freq: Four times a day (QID) | ORAL | Status: DC | PRN

## 2013-03-12 MED ORDER — ENOXAPARIN SODIUM 40 MG/0.4ML ~~LOC~~ SOLN
40.0000 mg | SUBCUTANEOUS | Status: DC
  Filled 2013-03-12: qty 0.4

## 2013-03-12 MED ORDER — ENOXAPARIN SODIUM 100 MG/ML ~~LOC~~ SOLN
0.5000 mg/kg | SUBCUTANEOUS | Status: DC
  Administered 2013-03-12 – 2013-03-15 (×4): 100 mg via SUBCUTANEOUS
  Filled 2013-03-12 (×5): qty 1

## 2013-03-12 MED ORDER — VANCOMYCIN HCL 10 G IV SOLR
1500.0000 mg | Freq: Two times a day (BID) | INTRAVENOUS | Status: DC
  Administered 2013-03-13: 1500 mg via INTRAVENOUS
  Filled 2013-03-12 (×3): qty 1500

## 2013-03-12 MED ORDER — VANCOMYCIN HCL 10 G IV SOLR
2500.0000 mg | Freq: Once | INTRAVENOUS | Status: AC
  Administered 2013-03-12: 2500 mg via INTRAVENOUS
  Filled 2013-03-12: qty 2500

## 2013-03-12 NOTE — ED Notes (Signed)
MD at bedside. 

## 2013-03-12 NOTE — ED Notes (Signed)
Admitting MD at bedside.

## 2013-03-12 NOTE — Consult Note (Signed)
CARDIOLOGY CONSULT NOTE   Patient ID: [name] MRN: [id number] DOB/AGE: 45-Nov-1967 45 y.o.  Admit date: 03/12/2013  Primary Physician   [name], MD Primary Cardiologist   NONE Reason for Consultation  Abnormal EKG  HPI: [name] is a 47yo morbidly obese female [phone]) with hx recurrent metastatic endometrial cancer on chemotherapy and recent IR vertebroplasty who was brought to [facility] ED via EMS when found unresponsive in bed at home this morning. She was given narcan by EMS which improved her level of consciousness. Currently she is alert and oriented and feeling fine. In the emergency department patient was found to be in acute hypoxic respiratory failure with a white count and an ion gap lactic acidosis. Patient's labs revealed a CO2 of 69 and o2 of 25.8 with a normal blood pH. Patient is on a long list of routine medications that include benzodiazepines, narcotics and muscle relaxers; however, she reports that she has been taking them as prescribed. Her last doses were this morning at 2 am PRN for pain. Patient was seen in oncology office 11/18 and started on abx for possible RLL cellulitis. DVT was r/o as oupt. She admits to feeling very tired and weak for the past couple of days. Denies SOB, palpitations, chest pain, lightheadedness, dizziness, pre syncope, syncope, hemoptysis, cough, fever, headache, or urinary symptoms.   EKG from EMS and ED showed sinus tach and questionable atrial fibrillation. Cardiology was consulted.    Past Medical History  Diagnosis Date  . Obesity   . DVT (deep venous thrombosis) 2009  . PONV (postoperative nausea and vomiting)   . Swelling     BOTH LEGS  . Rash     LOWER ABD  . Gallstones   . Ascites   . Uterine cancer      Past Surgical History  Procedure Laterality Date  . Ankle surgery  1950  . Wisdom tooth extraction    . Tonsillectomy and adenoidectomy    . Hysteroscopy w/d&c    . Abdominal hysterectomy   12/2009    RLH, BSO  . Laparoscopy N/A 10/02/2012    Procedure: LAPAROSCOPY DIAGNOSTIC, perocentisis, omental biopsy;  Surgeon: Wilmon Arms. Tsuei, MD;  Location: [facility] ORS;  Service: General;  Laterality: N/A;  removed a total of 15,000 of acities  . Paracentesis    . Kyphoplasty  02/26/2013    No Known Allergies  I have reviewed the patient's current medications . amiodarone  150 mg Intravenous Once   . sodium chloride 10 mL/hr at 03/12/13 0857  . amiodarone (NEXTERONE PREMIX) 360 mg/200 mL dextrose     Followed by  . amiodarone (NEXTERONE PREMIX) 360 mg/200 mL dextrose       Prior to Admission medications   Medication Sig Start Date End Date Taking? Authorizing Provider  Ascorbic Acid (VITAMIN C) 1000 MG tablet Take 1,000 mg by mouth daily.   Yes Historical Provider, MD  clindamycin (CLEOCIN) 300 MG capsule Take 300 mg by mouth 3 (three) times daily. 03/10/13  Yes Keitha Butte, NP  Cyanocobalamin (VITAMIN B-12 PO) Take 2,500 mg by mouth daily.    Yes Historical Provider, MD  cyclobenzaprine (FLEXERIL) 5 MG tablet Take 5 mg by mouth 3 (three) times daily as needed for muscle spasms. 02/17/13  Yes Victorino December, MD  dexamethasone (DECADRON) 4 MG tablet Take 8 mg by mouth 2 (two) times daily with a meal.   Yes Historical Provider, MD  dextromethorphan-guaiFENesin [facility] DM) 30-600  MG per 12 hr tablet Take 1 tablet by mouth 2 (two) times daily.   Yes Historical Provider, MD  ferrous sulfate 325 (65 FE) MG tablet Take 325 mg by mouth 3 (three) times daily with meals.   Yes Historical Provider, MD  fluconazole (DIFLUCAN) 100 MG tablet Take 100 mg by mouth daily. 03/10/13  Yes Keitha Butte, NP  folic acid (FOLVITE) 400 MCG tablet Take 400 mcg by mouth daily.     Yes Historical Provider, MD  lidocaine-prilocaine (EMLA) cream Apply 1 application topically as needed. Apply to port-a-cath site 1-2 hours prior to treatment. 10/29/12  Yes Victorino December, MD  loratadine (CLARITIN) 10 MG  tablet Take 10 mg by mouth daily.   Yes Historical Provider, MD  LORazepam (ATIVAN) 0.5 MG tablet Take 0.5 mg by mouth every 6 (six) hours as needed (Nausea or vomiting). 02/24/13  Yes Illa Level, NP  magnesium gluconate (MAGONATE) 500 MG tablet Take 500 mg by mouth daily.   Yes Historical Provider, MD  Multiple Vitamins-Minerals (MULTIVITAMIN WITH MINERALS) tablet Take 1 tablet by mouth daily.    Yes Historical Provider, MD  naproxen sodium (ALEVE) 220 MG tablet Take 440 mg by mouth 2 (two) times daily with a meal.   Yes Historical Provider, MD  ondansetron (ZOFRAN) 8 MG tablet Take 8 mg by mouth 2 (two) times daily. Take two times a day starting the day after chemo for 3 days. Then take two times a day as needed for nausea or vomiting. 11/04/12  Yes Illa Level, NP  oxyCODONE-acetaminophen (PERCOCET) 10-325 MG per tablet Take 1 tablet by mouth every 4 (four) hours as needed for pain. 02/17/13  Yes Victorino December, MD  pantoprazole (PROTONIX) 40 MG tablet Take 40 mg by mouth daily. 12/23/12  Yes Illa Level, NP  prochlorperazine (COMPAZINE) 10 MG tablet Take 10 mg by mouth every 6 (six) hours as needed (Nausea or vomiting). 11/04/12  Yes Illa Level, NP  promethazine (PHENERGAN) 25 MG tablet Take 12.5 mg by mouth every 6 (six) hours as needed for nausea or vomiting.   Yes Historical Provider, MD  sucralfate (CARAFATE) 1 GM/10ML suspension Take 1 g by mouth 4 (four) times daily.   Yes Historical Provider, MD  triamterene-hydrochlorothiazide (MAXZIDE-25) 37.5-25 MG per tablet Take 1 tablet by mouth every morning.    Yes Historical Provider, MD  UNABLE TO FIND Heavy duty rolling walker  Diagnosis: Endometrial cancer 182.0; Ascites 789.59; severe fatigue 780.79 02/10/13  Yes Keitha Butte, NP  UNABLE TO FIND Super heavy duty wheelchair with anit-tippers and leg rests with heel loops  Diagnosis: Endometrial cancer 182.0; Ascites 789.59; severe fatigue 780.79, thoracic  compression fractures 733.13 02/24/13  Yes Illa Level, NP  CARBOPLATIN IV Inject 1 each into the vein every 21 ( twenty-one) days.    Historical Provider, MD  nystatin ointment (MYCOSTATIN) Apply 1 application topically 2 (two) times daily. 03/10/13   Keitha Butte, NP  PACLitaxel (TAXOL IV) Inject 1 each into the vein every 21 ( twenty-one) days.    Historical Provider, MD  pegfilgrastim (NEULASTA) 6 MG/0.6ML injection Inject 6 mg into the skin every 21 ( twenty-one) days.    Historical Provider, MD  potassium chloride (K-DUR) 10 MEQ tablet Take 2 tablets (20 mEq total) by mouth 2 (two) times daily. 02/10/13   Keitha Butte, NP  PRESCRIPTION MEDICATION every 21 ( twenty-one) days. CHEMOTHERAPY REGIMEN    Historical Provider, MD  History   Social History  . Marital Status: Single    Spouse Name: N/A    Number of Children: N/A  . Years of Education: N/A   Occupational History  . Not on file.   Social History Main Topics  . Smoking status: Never Smoker   . Smokeless tobacco: Never Used  . Alcohol Use: Yes     Comment: social, Occassionally  . Drug Use: No  . Sexual Activity: No   Other Topics Concern  . Not on file   Social History Narrative  . No narrative on file    Family Status  Relation Status Death Age  . Father Alive   . Mother Alive    Family History  Problem Relation Age of Onset  . Heart disease Father   . Diabetes Brother   . Heart disease Mother      ROS:  Full 14 point review of systems complete and found to be negative unless listed above.  Physical Exam: Blood pressure 84/61, pulse 102, temperature 96.8 F (36 C), temperature source Rectal, resp. rate 18, weight 435 lb (197.315 kg), SpO2 100.00%.  General: Well developed, well nourished, morbidly obese  HEENT: NCAT, PERRLA, EOMI, Anicteic Sclera, mucous membranes moist. No pharyngeal erythema or exudates  Neck: Supple, no JVD, no masses Cardiovascular: S1 S2 auscultated,  tachycardic  Respiratory: Clear to auscultation bilaterally with equal chest rise, airway congestion noted  Abdomen: Soft, obese, nondistended, + bowel sounds  Extremities: warm dry without cyanosis clubbing. Cellulitis noted on the right lower extremity with erythema and warmth. 2+ pitting edema lower extremities bilaterally  Neuro: Unable to assess at this time Skin: Without rashes exudates or nodules, cellulitis wound on the right lower extremity   Labs:   Lab Results  Component Value Date   WBC 15.3* 03/12/2013   HGB 9.4* 03/12/2013   HCT 30.1* 03/12/2013   MCV 113.6* 03/12/2013   PLT 384 03/12/2013    Recent Labs  03/12/13 0845  INR 1.17    Recent Labs Lab 03/12/13 0845  NA 138  K 3.9  CL 96  CO2 21  BUN 18  CREATININE 0.89  CALCIUM 8.9  PROT 6.3  BILITOT 0.5  ALKPHOS 179*  ALT 53*  AST 42*  GLUCOSE 207*  ALBUMIN 2.6*   Recent Labs  03/12/13 0845  TROPONINI <0.30   No results found for this basename: TROPIPOC,  in the last 72 hours Pro B Natriuretic peptide (BNP)  Date/Time Value Range Status  03/12/2013  8:45 AM 2322.0* 0 - 125 pg/mL Final     Echo: none  ECG: Sinus tach 115 bpm  Radiology:  Ct Head Wo Contrast  03/12/2013   CLINICAL DATA:  Altered mental status.  EXAM: CT HEAD WITHOUT CONTRAST  TECHNIQUE: Contiguous axial images were obtained from the base of the skull through the vertex without contrast.  COMPARISON:  None.  FINDINGS: No evidence for acute infarction, hemorrhage, mass lesion, hydrocephalus, or extra-axial fluid. Premature for age cerebral atrophy. Chronic microvascular ischemic change is noted in the periventricular and subcortical white matter. No osseous lesions. Right maxillary and right ethmoid sinus disease. Grossly negative orbits. Nonspecific right mastoid fluid. Increased body habitus. Intubated.  IMPRESSION: Premature for age cerebral atrophy.  No acute intracranial findings.   Electronically Signed   By: Davonna Belling M.D.    On: 03/12/2013 10:07   Dg Chest Port 1 View  03/12/2013   CLINICAL DATA:  Shortness of breath, respiratory distress  EXAM: PORTABLE  CHEST - 1 VIEW  COMPARISON:  02/02/2013.  FINDINGS: The chest is rotated towards the right. There is bilateral diffuse interstitial thickening likely secondary to low lung volumes and technique. There is no pleural effusion or focal consolidation.  The cardiac silhouette is mildly enlarged. There is a right-sided Port-A-Cath projecting over the SVC.  IMPRESSION: Accounting for the differences in technique, there is no significant interval change. No acute cardiopulmonary disease. .   Electronically Signed   By: Elige Ko   On: 03/12/2013 08:58    ASSESSMENT AND PLAN:      AMS  Acute hypoxic respiratory failure  OHS/ presumed OSA  Lactic acidosis   Morbidly obese female with metastatic endometrial cancer with AMS (now resolved) and acute hypoxic respiratory failure in the setting of presumed underlying obesity hypoventilation syndrome and obstuctive sleep apnea. ARF possibly secondary to oversedation with routine medications or some underlying infectious process (RLE cellulitis, UTI?)  EKG reviewed with Dr. Elease Hashimoto. Felt to be in NSR with intermittent sinus tachycardia. Likely caused by some underlying infectious process as reflected in her white count and elevated lactic acid with anion gap.   ARF with hypoxia and hypercapnia in the setting of a normal PH suggests a compensated chronic respiratory acidosis caused by obesity hypoventilation syndrome.  No further work up from a cardiology standpoint.  Thank you for letting us take part in your care.   SignedThereasa Parkin, PA-C 03/12/2013 2:10 PM  Attending Note:   The patient was seen and examined.  Agree with assessment and plan as noted above.  Changes made to the above note as needed.  The medical team had concerns about atrial fibrillation.  The ECGs in question have lots of artifact but we could  identify definite P waves.  Currently, she in in sinus rhythm for sure ( sinus tach at 104).  Her respiratory failure episode was not cardiac related but is most likely due to an acute infection in the setting of chronic obesity hypoventilation.  Her WBC is elevated; she has a lactic acidosis.  I suspect that the underlying infection caused an acidosis and she was not able to increase her ventilation enough to compensate.   No further cardiac work up needed.   Will sign off.  Call for question.   Vesta Mixer, Montez Hageman., MD, [facility] 03/12/2013, 3:45 PM Office - [phone] Pager [phone]   Vesta Mixer, Montez Hageman., MD, [facility] 03/12/2013, 3:41 PM

## 2013-03-12 NOTE — ED Notes (Signed)
I/O cath assisted by RN Guerry Minors and RN Dairl Ponder.

## 2013-03-12 NOTE — ED Notes (Signed)
Family at bedside. 

## 2013-03-12 NOTE — Progress Notes (Signed)
Attempted to call ED nurse for report.  Nurse is not available to give report as this time.  Will call again later.

## 2013-03-12 NOTE — Consult Note (Signed)
PULMONARY  / CRITICAL CARE MEDICINE  Name: [name] MRN: [id number] DOB: 07-04-1965    ADMISSION DATE:  03/12/2013 CONSULTATION DATE:  03/12/13  REFERRING MD :  Denton Lank PRIMARY SERVICE: [facility]   CHIEF COMPLAINT:  Lactic acidosis, resp distress   BRIEF PATIENT DESCRIPTION: 47yo morbidly obese female with hx recurrent metastatic endometrial cancer with current chemotherapy and recent IR vertebroplasty (11/6) r/t compression fx.  She presented 11/20 after being found unresponsive in bed at home, mental status improved with narcan per EMS.    SIGNIFICANT EVENTS / STUDIES:  11/6 IR verterbroplasty 11/20 CT head>>> neg   LINES / TUBES: none  CULTURES: Urine 11/20>>>  ANTIBIOTICS: Clindamycin (outpt) 11/18>>>  HISTORY OF PRESENT ILLNESS:  47yo morbidly obese female with hx recurrent metastatic endometrial cancer with current chemotherapy (due 11/20)  and recent IR vertebroplasty (11/6) r/t compression fx.  She presented 11/20 after being found unresponsive in bed at home, mental status improved with narcan per EMS.  Has been having issues with cont back pain post IR procedure and may have taken increasing amounts of muscle relaxers.  She was seen in oncology office 11/18 and started on abx for possible RLL cellulitis. DVT was r/o as oupt. Was otherwise in her usual state of health until 11/19 when she was more sleepy than usual per mother. Takes mult meds at home including ativan, muscle relaxers, pain meds but family states these are not new and she does not "overuse" them.  Has tolerated chemo fairly well with intermittent nausea only.  Denies SOB currently.  Denies chest pain, hemoptysis, cough, fever, headache, urinary symptoms.     PAST MEDICAL HISTORY :  Past Medical History  Diagnosis Date  . Obesity   . DVT (deep venous thrombosis) 2009  . PONV (postoperative nausea and vomiting)   . Swelling     BOTH LEGS  . Rash     LOWER ABD  . Gallstones   . Ascites   . Uterine  cancer    Past Surgical History  Procedure Laterality Date  . Ankle surgery  1950  . Wisdom tooth extraction    . Tonsillectomy and adenoidectomy    . Hysteroscopy w/d&c    . Abdominal hysterectomy  12/2009    RLH, BSO  . Laparoscopy N/A 10/02/2012    Procedure: LAPAROSCOPY DIAGNOSTIC, perocentisis, omental biopsy;  Surgeon: Wilmon Arms. Tsuei, MD;  Location: [facility] ORS;  Service: General;  Laterality: N/A;  removed a total of 15,000 of acities  . Paracentesis    . Kyphoplasty  02/26/2013   Prior to Admission medications   Medication Sig Start Date End Date Taking? Authorizing Provider  Ascorbic Acid (VITAMIN C) 1000 MG tablet Take 1,000 mg by mouth daily.   Yes Historical Provider, MD  clindamycin (CLEOCIN) 300 MG capsule Take 300 mg by mouth 3 (three) times daily. 03/10/13  Yes Keitha Butte, NP  Cyanocobalamin (VITAMIN B-12 PO) Take 2,500 mg by mouth daily.    Yes Historical Provider, MD  cyclobenzaprine (FLEXERIL) 5 MG tablet Take 5 mg by mouth 3 (three) times daily as needed for muscle spasms. 02/17/13  Yes Victorino December, MD  dexamethasone (DECADRON) 4 MG tablet Take 8 mg by mouth 2 (two) times daily with a meal.   Yes Historical Provider, MD  dextromethorphan-guaiFENesin (MUCINEX DM) 30-600 MG per 12 hr tablet Take 1 tablet by mouth 2 (two) times daily.   Yes Historical Provider, MD  ferrous sulfate 325 (65 FE) MG tablet Take 325  mg by mouth 3 (three) times daily with meals.   Yes Historical Provider, MD  fluconazole (DIFLUCAN) 100 MG tablet Take 100 mg by mouth daily. 03/10/13  Yes Keitha Butte, NP  folic acid (FOLVITE) 400 MCG tablet Take 400 mcg by mouth daily.     Yes Historical Provider, MD  lidocaine-prilocaine (EMLA) cream Apply 1 application topically as needed. Apply to port-a-cath site 1-2 hours prior to treatment. 10/29/12  Yes Victorino December, MD  loratadine (CLARITIN) 10 MG tablet Take 10 mg by mouth daily.   Yes Historical Provider, MD  LORazepam (ATIVAN) 0.5 MG  tablet Take 0.5 mg by mouth every 6 (six) hours as needed (Nausea or vomiting). 02/24/13  Yes Illa Level, NP  magnesium gluconate (MAGONATE) 500 MG tablet Take 500 mg by mouth daily.   Yes Historical Provider, MD  Multiple Vitamins-Minerals (MULTIVITAMIN WITH MINERALS) tablet Take 1 tablet by mouth daily.    Yes Historical Provider, MD  naproxen sodium (ALEVE) 220 MG tablet Take 440 mg by mouth 2 (two) times daily with a meal.   Yes Historical Provider, MD  ondansetron (ZOFRAN) 8 MG tablet Take 8 mg by mouth 2 (two) times daily. Take two times a day starting the day after chemo for 3 days. Then take two times a day as needed for nausea or vomiting. 11/04/12  Yes Illa Level, NP  oxyCODONE-acetaminophen (PERCOCET) 10-325 MG per tablet Take 1 tablet by mouth every 4 (four) hours as needed for pain. 02/17/13  Yes Victorino December, MD  pantoprazole (PROTONIX) 40 MG tablet Take 40 mg by mouth daily. 12/23/12  Yes Illa Level, NP  prochlorperazine (COMPAZINE) 10 MG tablet Take 10 mg by mouth every 6 (six) hours as needed (Nausea or vomiting). 11/04/12  Yes Illa Level, NP  promethazine (PHENERGAN) 25 MG tablet Take 12.5 mg by mouth every 6 (six) hours as needed for nausea or vomiting.   Yes Historical Provider, MD  sucralfate (CARAFATE) 1 GM/10ML suspension Take 1 g by mouth 4 (four) times daily.   Yes Historical Provider, MD  triamterene-hydrochlorothiazide (MAXZIDE-25) 37.5-25 MG per tablet Take 1 tablet by mouth every morning.    Yes Historical Provider, MD  UNABLE TO FIND Heavy duty rolling walker  Diagnosis: Endometrial cancer 182.0; Ascites 789.59; severe fatigue 780.79 02/10/13  Yes Keitha Butte, NP  UNABLE TO FIND Super heavy duty wheelchair with anit-tippers and leg rests with heel loops  Diagnosis: Endometrial cancer 182.0; Ascites 789.59; severe fatigue 780.79, thoracic compression fractures 733.13 02/24/13  Yes Illa Level, NP  CARBOPLATIN IV Inject 1 each  into the vein every 21 ( twenty-one) days.    Historical Provider, MD  nystatin ointment (MYCOSTATIN) Apply 1 application topically 2 (two) times daily. 03/10/13   Keitha Butte, NP  PACLitaxel (TAXOL IV) Inject 1 each into the vein every 21 ( twenty-one) days.    Historical Provider, MD  pegfilgrastim (NEULASTA) 6 MG/0.6ML injection Inject 6 mg into the skin every 21 ( twenty-one) days.    Historical Provider, MD  potassium chloride (K-DUR) 10 MEQ tablet Take 2 tablets (20 mEq total) by mouth 2 (two) times daily. 02/10/13   Keitha Butte, NP  PRESCRIPTION MEDICATION every 21 ( twenty-one) days. CHEMOTHERAPY REGIMEN    Historical Provider, MD   No Known Allergies  FAMILY HISTORY:  Family History  Problem Relation Age of Onset  . Heart disease Father   . Diabetes Brother   . Heart disease  Mother    SOCIAL HISTORY:  reports that she has never smoked. She has never used smokeless tobacco. She reports that she drinks alcohol. She reports that she does not use illicit drugs.  REVIEW OF SYSTEMS:  As per HPI - all other systems reviewed and were neg.    VITAL SIGNS: Temp:  [96.8 F (36 C)] 96.8 F (36 C) (11/20 0905) Pulse Rate:  [103-121] 103 (11/20 1200) Resp:  [18-25] 18 (11/20 1200) BP: (78-163)/(56-149) 86/62 mmHg (11/20 1200) SpO2:  [60 %-100 %] 100 % (11/20 1200) FiO2 (%):  [60 %] 60 % (11/20 0900) Weight:  [435 lb (197.315 kg)] 435 lb (197.315 kg) (11/20 0825) HEMODYNAMICS:   VENTILATOR SETTINGS: Vent Mode:  [-] BIPAP FiO2 (%):  [60 %] 60 % Set Rate:  [16 bmp] 16 bmp PEEP:  [5 cmH20] 5 cmH20 INTAKE / OUTPUT: Intake/Output   None     PHYSICAL EXAMINATION: General:  Obese, chronically ill appearing female, NAD  Neuro:  Awake, alert, appropriate, MAE  HEENT:  Mm moist, no JVD, NRB Cardiovascular:  s1s2 distant  Lungs:  resps even non labored, diminished throughout  Abdomen:  Obese, soft, +bs Musculoskeletal:  Warm and dry, BLE venous stasis, RLE  erythematous, warm  LABS:  CBC  Recent Labs Lab 03/10/13 1116 03/12/13 0845  WBC 9.0 15.3*  HGB 9.4* 9.4*  HCT 28.0* 30.1*  PLT 259 384   Coag's  Recent Labs Lab 03/12/13 0845  INR 1.17   BMET  Recent Labs Lab 03/10/13 1116 03/12/13 0845  NA 139 138  K 3.1* 3.9  CL  --  96  CO2 23 21  BUN 10.9 18  CREATININE 0.7 0.89  GLUCOSE 172* 207*   Electrolytes  Recent Labs Lab 03/10/13 1116 03/12/13 0845  CALCIUM 9.1 8.9   Sepsis Markers  Recent Labs Lab 03/12/13 0855  LATICACIDVEN 9.74*   ABG  Recent Labs Lab 03/12/13 0942  PHART 7.367  PCO2ART 45.0  PO2ART 69.0*   Liver Enzymes  Recent Labs Lab 03/10/13 1116 03/12/13 0845  AST 17 42*  ALT 43 53*  ALKPHOS 155* 179*  BILITOT 0.61 0.5  ALBUMIN 2.7* 2.6*   Cardiac Enzymes  Recent Labs Lab 03/12/13 0845  TROPONINI <0.30  PROBNP 2322.0*   Glucose  Recent Labs Lab 03/12/13 0829  GLUCAP 210*    Imaging Ct Head Wo Contrast  03/12/2013   CLINICAL DATA:  Altered mental status.  EXAM: CT HEAD WITHOUT CONTRAST  TECHNIQUE: Contiguous axial images were obtained from the base of the skull through the vertex without contrast.  COMPARISON:  None.  FINDINGS: No evidence for acute infarction, hemorrhage, mass lesion, hydrocephalus, or extra-axial fluid. Premature for age cerebral atrophy. Chronic microvascular ischemic change is noted in the periventricular and subcortical white matter. No osseous lesions. Right maxillary and right ethmoid sinus disease. Grossly negative orbits. Nonspecific right mastoid fluid. Increased body habitus. Intubated.  IMPRESSION: Premature for age cerebral atrophy.  No acute intracranial findings.   Electronically Signed   By: Davonna Belling M.D.   On: 03/12/2013 10:07   Dg Chest Port 1 View  03/12/2013   CLINICAL DATA:  Shortness of breath, respiratory distress  EXAM: PORTABLE CHEST - 1 VIEW  COMPARISON:  02/02/2013.  FINDINGS: The chest is rotated towards the right. There  is bilateral diffuse interstitial thickening likely secondary to low lung volumes and technique. There is no pleural effusion or focal consolidation.  The cardiac silhouette is mildly enlarged. There is a right-sided  Port-A-Cath projecting over the SVC.  IMPRESSION: Accounting for the differences in technique, there is no significant interval change. No acute cardiopulmonary disease. .   Electronically Signed   By: Elige Ko   On: 03/12/2013 08:58     ASSESSMENT / PLAN:   AMS  Acute hypoxic respiratory failure  OHS/ presumed OSA  Lactic acidosis   Morbidly obese female with metastatic endometrial cancer with AMS (now resolved) and acute hypoxic respiratory failure in setting presumed underlying OHS/OSA c/b oversedation and Afib/tachycardia.   ??Etiology of lactate=9.  Does have RLE cellulitis but does not appear toxic.    PLAN -  O2 as needed  Bipap Qhs and PRN  Repeat lactate after gentle volume  Cont abx for RLE cellulitis  Avoid oversedation  Likely needs outpt sleep eval/PSG  HR control  F/u CXR   Sinus Tach - per cards  RLE cellulitis  Anemia  Metastatic endometrial cancer -- oncology following  Per Berle Mull, NP 03/12/2013  12:31 PM Pager: [phone] or [phone]  Will recheck lactic acid, if stabilizing then no further interventions, if truly elevated then will need to consider alternative sources such as ischemic bowel (unlikely) or inherent metabolic abnormalities.  Agree with SDU admission, PCCM will f/u with you.  Agree with abx choice.  *Care during the described time interval was provided by me and/or other providers on the critical care team. I have reviewed this patient's available data, including medical history, events of note, physical examination and test results as part of my evaluation.  Alyson Reedy, M.D. [facility] Pulmonary/Critical Care Medicine. Pager: [phone]. After hours pager: [phone].

## 2013-03-12 NOTE — H&P (Signed)
[facility] History and Physical  [name]:[id number] DOB: 07/15/1965 DOA: 03/12/2013  Referring physician:  PCP: [name], MD  Specialists: Laurette Schimke, MD  Chief Complaint: Altered mental status, respiratory distress  HPI: [name] is a 47 y.o. female  With a history of endometrial cancer currently on chemotherapy, obesity that presents to the emergency room with altered mental status. She is accompanied by her mother as well as on. Information details history and physical obtained from mother. The mother woke up this morning and stated that she found her daughter, sleeping and was unable to arouse her. She was due to have her chemotherapy treatment today. Her last chemotherapy treatment was 3 weeks ago. Her mother states that she is having a hard time breathing at which point EMS was called and patient was brought to the emergency department. She was placed on BiPAP. In route EMS the patient is Narcan. While in the emergency department she was also given a second dose of Narcan. According to emergency room physician, the patient was more arousable. Upon my exam, patient was arousable with a tactile stimulation. She was unable to answer any questions. Patient was on 2 L nasal cannula and satting at 9700. Her heart rate did begin to spike between 150-160. Her EKG did show sinus tachycardia however the monitor currently shows atrial fibrillation. Patient does not have a history of atrial fibrillation.  The mother does deny any recent history of travel. She did state that the daughter had some leg swelling for which she received an ultrasound of her legs which was negative for DVT. She has recently started on clindamycin by mouth for her cellulitis of her right lower extremity. The mother denies any complaints of a daughter in recent days of abdominal pain and nausea cough shortness of breath. She also states her daughter has never been diagnosed with sleep apnea.  Review  of Systems: Unable to obtain secondary to patient's current state.  Past Medical History  Diagnosis Date  . Obesity   . DVT (deep venous thrombosis) 2009  . PONV (postoperative nausea and vomiting)   . Swelling     BOTH LEGS  . Rash     LOWER ABD  . Gallstones   . Ascites   . Uterine cancer    Past Surgical History  Procedure Laterality Date  . Ankle surgery  1950  . Wisdom tooth extraction    . Tonsillectomy and adenoidectomy    . Hysteroscopy w/d&c    . Abdominal hysterectomy  12/2009    [facility], [facility]  . Laparoscopy N/A 10/02/2012    Procedure: LAPAROSCOPY DIAGNOSTIC, perocentisis, omental biopsy;  Surgeon: Wilmon Arms. Tsuei, MD;  Location: [facility];  Service: General;  Laterality: N/A;  removed a total of 15,000 of acities  . Paracentesis    . Kyphoplasty  02/26/2013   Social History:  reports that she has never smoked. She has never used smokeless tobacco. She reports that she drinks alcohol. She reports that she does not use illicit drugs. Lives at home with her parents.  No Known Allergies  Family History  Problem Relation Age of Onset  . Heart disease Father   . Diabetes Brother   . Heart disease Mother     Prior to Admission medications   Medication Sig Start Date End Date Taking? Authorizing Provider  Ascorbic Acid (VITAMIN C) 1000 MG tablet Take 1,000 mg by mouth daily.   Yes Historical Provider, MD  clindamycin (CLEOCIN) 300 MG capsule Take  300 mg by mouth 3 (three) times daily. 03/10/13  Yes Keitha Butte, NP  Cyanocobalamin (VITAMIN B-12 PO) Take 2,500 mg by mouth daily.    Yes Historical Provider, MD  cyclobenzaprine (FLEXERIL) 5 MG tablet Take 5 mg by mouth 3 (three) times daily as needed for muscle spasms. 02/17/13  Yes Victorino December, MD  dexamethasone (DECADRON) 4 MG tablet Take 8 mg by mouth 2 (two) times daily with a meal.   Yes Historical Provider, MD  dextromethorphan-guaiFENesin (MUCINEX DM) 30-600 MG per 12 hr tablet Take 1 tablet by mouth 2 (two)  times daily.   Yes Historical Provider, MD  ferrous sulfate 325 (65 FE) MG tablet Take 325 mg by mouth 3 (three) times daily with meals.   Yes Historical Provider, MD  fluconazole (DIFLUCAN) 100 MG tablet Take 100 mg by mouth daily. 03/10/13  Yes Keitha Butte, NP  folic acid (FOLVITE) 400 MCG tablet Take 400 mcg by mouth daily.     Yes Historical Provider, MD  lidocaine-prilocaine (EMLA) cream Apply 1 application topically as needed. Apply to port-a-cath site 1-2 hours prior to treatment. 10/29/12  Yes Victorino December, MD  loratadine (CLARITIN) 10 MG tablet Take 10 mg by mouth daily.   Yes Historical Provider, MD  LORazepam (ATIVAN) 0.5 MG tablet Take 0.5 mg by mouth every 6 (six) hours as needed (Nausea or vomiting). 02/24/13  Yes Illa Level, NP  magnesium gluconate (MAGONATE) 500 MG tablet Take 500 mg by mouth daily.   Yes Historical Provider, MD  Multiple Vitamins-Minerals (MULTIVITAMIN WITH MINERALS) tablet Take 1 tablet by mouth daily.    Yes Historical Provider, MD  naproxen sodium (ALEVE) 220 MG tablet Take 440 mg by mouth 2 (two) times daily with a meal.   Yes Historical Provider, MD  ondansetron (ZOFRAN) 8 MG tablet Take 8 mg by mouth 2 (two) times daily. Take two times a day starting the day after chemo for 3 days. Then take two times a day as needed for nausea or vomiting. 11/04/12  Yes Illa Level, NP  oxyCODONE-acetaminophen (PERCOCET) 10-325 MG per tablet Take 1 tablet by mouth every 4 (four) hours as needed for pain. 02/17/13  Yes Victorino December, MD  pantoprazole (PROTONIX) 40 MG tablet Take 40 mg by mouth daily. 12/23/12  Yes Illa Level, NP  prochlorperazine (COMPAZINE) 10 MG tablet Take 10 mg by mouth every 6 (six) hours as needed (Nausea or vomiting). 11/04/12  Yes Illa Level, NP  promethazine (PHENERGAN) 25 MG tablet Take 12.5 mg by mouth every 6 (six) hours as needed for nausea or vomiting.   Yes Historical Provider, MD  sucralfate (CARAFATE) 1 GM/10ML  suspension Take 1 g by mouth 4 (four) times daily.   Yes Historical Provider, MD  triamterene-hydrochlorothiazide (MAXZIDE-25) 37.5-25 MG per tablet Take 1 tablet by mouth every morning.    Yes Historical Provider, MD  UNABLE TO FIND Heavy duty rolling walker  Diagnosis: Endometrial cancer 182.0; Ascites 789.59; severe fatigue 780.79 02/10/13  Yes Keitha Butte, NP  UNABLE TO FIND Super heavy duty wheelchair with anit-tippers and leg rests with heel loops  Diagnosis: Endometrial cancer 182.0; Ascites 789.59; severe fatigue 780.79, thoracic compression fractures 733.13 02/24/13  Yes Illa Level, NP  CARBOPLATIN IV Inject 1 each into the vein every 21 ( twenty-one) days.    Historical Provider, MD  nystatin ointment (MYCOSTATIN) Apply 1 application topically 2 (two) times daily. 03/10/13   Keitha Butte,  NP  PACLitaxel (TAXOL IV) Inject 1 each into the vein every 21 ( twenty-one) days.    Historical Provider, MD  pegfilgrastim (NEULASTA) 6 MG/0.6ML injection Inject 6 mg into the skin every 21 ( twenty-one) days.    Historical Provider, MD  potassium chloride (K-DUR) 10 MEQ tablet Take 2 tablets (20 mEq total) by mouth 2 (two) times daily. 02/10/13   Keitha Butte, NP  PRESCRIPTION MEDICATION every 21 ( twenty-one) days. CHEMOTHERAPY REGIMEN    Historical Provider, MD   Physical Exam: Filed Vitals:   03/12/13 1130  BP: 78/56  Pulse: 103  Temp:   Resp: 25     General: Well developed, well nourished, morbidly obese   HEENT: NCAT, PERRLA, EOMI, Anicteic Sclera, mucous membranes moist. No pharyngeal erythema or exudates  Neck: Supple, no JVD, no masses  Cardiovascular: S1 S2 auscultated, tachycardic  Respiratory: Clear to auscultation bilaterally with equal chest rise, airway congestion noted  Abdomen: Soft, obese, nondistended, + bowel sounds  Extremities: warm dry without cyanosis clubbing.  Cellulitis noted on the right lower extremity with erythema and  warmth. 2+ pitting edema lower extremities bilaterally  Neuro: Unable to assess at this time   Skin: Without rashes exudates or nodules, cellulitis wound on the right lower extremity  Labs on Admission:  Basic Metabolic Panel:  Recent Labs Lab 03/10/13 1116 03/12/13 0845  NA 139 138  K 3.1* 3.9  CL  --  96  CO2 23 21  GLUCOSE 172* 207*  BUN 10.9 18  CREATININE 0.7 0.89  CALCIUM 9.1 8.9   Liver Function Tests:  Recent Labs Lab 03/10/13 1116 03/12/13 0845  AST 17 42*  ALT 43 53*  ALKPHOS 155* 179*  BILITOT 0.61 0.5  PROT 6.0* 6.3  ALBUMIN 2.7* 2.6*   No results found for this basename: LIPASE, AMYLASE,  in the last 168 hours No results found for this basename: AMMONIA,  in the last 168 hours CBC:  Recent Labs Lab 03/10/13 1116 03/12/13 0845  WBC 9.0 15.3*  NEUTROABS 6.5  --   HGB 9.4* 9.4*  HCT 28.0* 30.1*  MCV 108.1* 113.6*  PLT 259 384   Cardiac Enzymes:  Recent Labs Lab 03/12/13 0845  TROPONINI <0.30    BNP (last 3 results)  Recent Labs  03/12/13 0845  PROBNP 2322.0*   CBG:  Recent Labs Lab 03/12/13 0829  GLUCAP 210*    Radiological Exams on Admission: Ct Head Wo Contrast  03/12/2013   CLINICAL DATA:  Altered mental status.  EXAM: CT HEAD WITHOUT CONTRAST  TECHNIQUE: Contiguous axial images were obtained from the base of the skull through the vertex without contrast.  COMPARISON:  None.  FINDINGS: No evidence for acute infarction, hemorrhage, mass lesion, hydrocephalus, or extra-axial fluid. Premature for age cerebral atrophy. Chronic microvascular ischemic change is noted in the periventricular and subcortical white matter. No osseous lesions. Right maxillary and right ethmoid sinus disease. Grossly negative orbits. Nonspecific right mastoid fluid. Increased body habitus. Intubated.  IMPRESSION: Premature for age cerebral atrophy.  No acute intracranial findings.   Electronically Signed   By: Davonna Belling M.D.   On: 03/12/2013 10:07   Dg  Chest Port 1 View  03/12/2013   CLINICAL DATA:  Shortness of breath, respiratory distress  EXAM: PORTABLE CHEST - 1 VIEW  COMPARISON:  02/02/2013.  FINDINGS: The chest is rotated towards the right. There is bilateral diffuse interstitial thickening likely secondary to low lung volumes and technique. There is no pleural effusion  or focal consolidation.  The cardiac silhouette is mildly enlarged. There is a right-sided Port-A-Cath projecting over the SVC.  IMPRESSION: Accounting for the differences in technique, there is no significant interval change. No acute cardiopulmonary disease. .   Electronically Signed   By: Elige Ko   On: 03/12/2013 08:58    EKG: Independently reviewed. Sinus tachycardia, rate 115, normal axis  Assessment/Plan Principal Problem:   Respiratory failure Active Problems:   Obesity - BMI 79   Endometrial cancer   Lactic acidosis   Leukocytosis   Encephalopathy acute   Sinus tachycardia   Anemia  Acute Respiratory failure This may be multifactorial due to to polypharmacy versus new onset atrial fibrillation versus possible PE. Unfortunately cannot obtain a V/Q scan or CT scan of the chest to rule out pulmonary embolism time the patient's morbid obesity. Date I would be not reliable as patient does have a history of endometrial cancer. Patient has a hypercoagulable state for PE. Will order stat echocardiogram to evaluate for right heart strain as well as left ventricular function. PCCM as well as cardiology has been consulted.  Patient is currently resting comfortably on 2 L nasal cannula with oxygen saturations of 97-100%. Patient does not have infectious processes noted on her chest x-ray. However chest x-ray does show respiratory failure. Will continue close monitoring.  Sinus tachycardia Questionable atrial fibrillation on telemetry monitor. Patient has had heart rates in in the 150-160. Will consider starting amiodarone. Cardiology has been consulted. Magnesium and  phosphate levels as well as TSH levels have been ordered.  This may also be secondary to her acute respiratory failure.  Acute encephalopathy Possibly secondary to her acute respiratory failure as well as polypharmacy. Patient was given 2 doses of Narcan. She does respond to tactile stimulation however continues to be altered. Will place patient on neurochecks. Her CT of the head was negative. Will order ammonia level.  Lactic acidosis Patient is lactic acid of 9.7. At this time she does not have any source of infection. This may be secondary to her non-ambulatory state.  Will continue to monitor her lactic acid as well as place patient on IV fluids.  Sepsis secondary to ?cellulitis  Again at this time patient does not have a clear etiology of infection other than her cellulitis. Will obtain blood cultures, as was urinalysis and urine culture. Will continue to monitor.  Cellulitis of the right lower extremity Will continue patient on her clindamycin IV.  Anemia Likely secondary to her chemotherapy as well as a endometrial cancer. Patient has not had any evidence of current bleeding. Will continue to monitor her H&H. As well as order an anemia panel.  Endometrial cancer Will consult oncology.  DVT prophylaxis: Lovenox  Code Status: DO NOT RESUSCITATE, obtained verbally from the mother.  Condition: Critical  Family Communication: Mother and aunt. Admission, patients condition and plan of care including tests being ordered have been discussed with patient's mother who indicates understanding and agrees with the plan and Code Status.  Disposition Plan: Admitted to step down   Time spent: 60 minutes  Chief Walkup D.O. [facility] Pager [phone]  If 7PM-7AM, please contact night-coverage [email or web address] Password [facility] 03/12/2013, 11:49 AM

## 2013-03-12 NOTE — ED Notes (Signed)
Patient arrived by ems for unresponsive found in bed, lsn was 0100, may have taken muscle relaxers last pm, , pt does have hx of endometrial CA and due for chemo today, pt was seen at [facility] long yesterday for CA. With ems had red frothy sputum in nose and mouth and suctioned pt and placed npa to right nare and sats improved from 60 to 90%. Given narcan and bagged oxygen and responsiveness improved pt does respond to stimuli on arrival to ed.

## 2013-03-12 NOTE — Progress Notes (Signed)
Responded to referral from on call Chaplain to continue support to Patient and family. Patient arrived by EMS after being found unconscieous in bed. Escorted family to consultation room and later to bedside. Patient sister is an employee here at [employer] in [place]. and is having a difficult time.  Promoted information sharing between staff and family. Provided listening,encouragement, hospitality and emotional and spiritual support to family. Emotional support and care intervention was provided to ED nurses.  03/12/13 0900  Clinical Encounter Type  Visited With Patient;Family;Patient and family together;Health care provider  Visit Type Spiritual support;ED;Trauma  Referral From Chaplain  Spiritual Encounters  Spiritual Needs Prayer;Emotional  Stress Factors  Patient Stress Factors Exhausted  Family Stress Factors Exhausted  .Mackenzie Groom, [address]

## 2013-03-12 NOTE — Progress Notes (Signed)
[facility]  [phone] [phone]  INPATIENT PROGRESS NOTE   PATIENT: [name]   DOB: [birth year]  MR#: [id number]  ZHY#:[id number]   EX:BMWUXL, Catherine Senior, MD GYN ONC: Laurette Schimke, M.D. SU:  Manus Rudd, M.D.    DIAGNOSIS: [name] is a 47 y.o. female with a prior history of stage IA uterine cancer diagnosed in 2010.  Now with recurrent disease diagnosed in 08/2012.  PRIOR THERAPY: #1  Patient developed excessive uterine bleeding.  She underwent dilatation and curettage with hysteroscopy. 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 Due to morbid obesity Mirena IUD was placed along with Aygestin. Patient had a dramatic directed weight loss with nutritional support she went from 523 pounds to 359 pounds in 10/2009.  On 12/27/2009 she underwent a robotic-assisted laparoscopic hysterectomy, bilateral salpingo-oophorectomy, and mini laparotomy through the umbilical port with morcellation of uterus within about the delivery of the uterus. The final pathology revealed an endometrial adenocarcinoma grade 2 with invasion limited to 1 mm of the myometrium.  #2  Patient continued to do well until 09/2012 when she developed right upper quadrant pain. It was presumed to be cholelithiasis. 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 Omental biopsies were obtained. The pathology was consistent with metastatic adenocarcinoma with the presumption of recurrent endometrial carcinoma.  She was seen by Dr. Laurette Schimke on 10/07/2012.  Dr. Nelly Rout recommended that [name] undergo chemotherapy consisting of Taxol and Carboplatinum.  Chemotherapy started on 11/04/2012.   #3 Abdominal ascites with multiple paracenteses.  #4 Back pain and subsequent MRI on 02/05/2013 revealed compression fractures of T10 and  T11.status post kyphoplasty per interventional radiology on 02/26/2013.   CURRENT THERAPY:  Taxol/Carboplatinum cycle 6 day 8. She was last seen at the office on 02/26/2013. She was due to receive day 1 cycle 7 today 03/12/2013   INTERVAL HISTORY:the patient was admitted via EMS with other mental status following what it appears to have been an episode of inability to be awaken and by her relative, and large pulses between breaths.according to her mother, she had chest compressions by her brother while unconscious. She was placed on BiPAP, and was given Narcan by EMS.her saturations were normal at 97 on 2 L. Status was complicated by severe tachycardia, with rate between 150 and 160. This events are following a recent kyphoplasty, however, recent bilateral lower extremity was negative for DVT. Of note, she had started on clindamycin by mouth for her cellulitis of her right lower extremity.Moreover, she may have taken muscle relaxers last night. The patient is being seen at the emergency department as we were informed of her admission. At this time, she is still agitated, although she is able to follow, and, and answer questions properly. She is tearful. She denies any pain.   PAST MEDICAL HISTORY: Past Medical History  Diagnosis Date  . Obesity   . DVT (deep venous thrombosis) 2009  . PONV (postoperative nausea and vomiting)   . Swelling     BOTH LEGS  . Rash     LOWER ABD  . Gallstones   . Ascites   . Uterine cancer     PAST SURGICAL HISTORY: Past Surgical History  Procedure Laterality Date  . Ankle surgery  1950  . Wisdom tooth extraction    . Tonsillectomy and adenoidectomy    . Hysteroscopy w/d&c    .  Abdominal hysterectomy  12/2009    [facility], [facility]  . Laparoscopy N/A 10/02/2012    Procedure: LAPAROSCOPY DIAGNOSTIC, perocentisis, omental biopsy;  Surgeon: Wilmon Arms. Tsuei, MD;  Location: [facility];  Service: General;  Laterality: N/A;  removed a total of 15,000 of acities  . Paracentesis     . Kyphoplasty  02/26/2013    FAMILY HISTORY: Family History  Problem Relation Age of Onset  . Heart disease Father   . Diabetes Brother   . Heart disease Mother     SOCIAL HISTORY: History  Substance Use Topics  . Smoking status: Never Smoker   . Smokeless tobacco: Never Used  . Alcohol Use: Yes     Comment: social, Occassionally    ALLERGIES: No Known Allergies   MEDICATIONS:are being reviewed by the admitting team.    PHYSICAL EXAMINATION:  BP 78/56  Pulse 103  Temp(Src) 96.8 F (36 C) (Rectal)  Resp 25  Wt 435 lb (197.315 kg)  SpO2 91%   General: Patient is a morbidly obese appearing female, diaphoretic, tearful HEENT: PERRLA, sclerae anicteric no conjunctival pallor, MMM Neck: supple, no palpable adenopathy Lungs: clear to auscultation bilaterally, no wheezes, rhonchi, or rales Cardiovascular: regular rate rhythm, S1, S2, no murmurs, rubs or gallops Abdomen: Soft, non-tender, non-distended, normoactive bowel sounds, no HSM, limited exam due to obesity Extremities: warm and well perfused, no clubbing, cyanosis,2+ pitting edema noted in both lower extremities. Skin: No rashes, At right lower extremity cellulitis noted. Neuro: Non-focal    LAB RESULTS: Lab Results  Component Value Date   WBC 15.3* 03/12/2013   NEUTROABS 6.5 03/10/2013   HGB 9.4* 03/12/2013   HCT 30.1* 03/12/2013   MCV 113.6* 03/12/2013   PLT 384 03/12/2013      Chemistry    BMET    Component Value Date/Time   NA 138 03/12/2013 0845   NA 139 03/10/2013 1116   K 3.9 03/12/2013 0845   K 3.1* 03/10/2013 1116   CL 96 03/12/2013 0845   CL 98 10/07/2012 0923   CO2 21 03/12/2013 0845   CO2 23 03/10/2013 1116   GLUCOSE 207* 03/12/2013 0845   GLUCOSE 172* 03/10/2013 1116   GLUCOSE 103* 10/07/2012 0923   BUN 18 03/12/2013 0845   BUN 10.9 03/10/2013 1116   CREATININE 0.89 03/12/2013 0845   CREATININE 0.7 03/10/2013 1116   CALCIUM 8.9 03/12/2013 0845   CALCIUM 9.1 03/10/2013 1116    GFRNONAA 77* 03/12/2013 0845   GFRAA 89* 03/12/2013 0845      BNP    Component Value Date/Time   PROBNP 2322.0* 03/12/2013 0845    RADIOGRAPHIC STUDIES: Dg Chest 2 View 02/02/2013   CLINICAL DATA:  Uterine cancer. Chemotherapy. Weakness. Shortness of breath.  EXAM: CHEST  2 VIEW  COMPARISON:  12/23/2009 and CT dated 10/21/2012.  FINDINGS: The cardiac silhouette is borderline enlarged and magnified by the portable AP technique. Clear lungs with normal vascularity. Interval nodular densities overlying the right mid and lower lung zones is well as a faintly visualized linear density. Thoracic spine degenerative changes.  IMPRESSION: Nodular and linear densities overlying the right mid and lower lung zones. These most likely represent overlying artifacts. Followup chest radiographs are recommended to exclude any true lung nodules.   Electronically Signed   By: Gordan Payment M.D.   On: 02/02/2013 12:56   MR of the thoracic spine with and without contrast on 02/05/2013 showed: 1.  13 mm and 8 mm hyperintense lesions at T3 and T6 respectively, and  possibly representing hemangiomas. 2.  Fractures with 20% and 25% loss of height involving T10 and T11 vertebrae. 3.  Minimal degenerative disc changes. 4.  Probable cholelithiasis.  In view of the above and the patient's history of cancer; further evaluation by means of either PET/CT or nuclear scan is suggested to rule out metastatic disease, etc. the findings were relate by phone to Dr. Welton Flakes at approximately 1320 hours.   MR lumbar spine with and without contrast on 02/05/2013 showed: 1.  Compression fractures of T10 and T11 detailed on a separate report of an MRI of the thoracic spine. 2.  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   ASSESSMENT: [name] is a 47 y.o.  woman: 1.  prior history of stage IA uterine cancer diagnosed in 2010.  Now with recurrent disease diagnosed in 05/201, currently on Taxol/Carboplatinum cycle 6 day 8. She was last seen at the office on 02/26/2013. She was due to receive day 1 cycle 7 today 03/12/2013.  Chemotherapy  Plans will be placed on hold as of today due to current events.  2. AMS/Respiratory distress: patient has been admitted by the Hospitalist Team who is to monitor all the acute medical issues in hopes to improve her clinical status.likely the patient is to be admitted to the step down unit  3. Questionable new onset of atrial fibrillation versus possible PE, but V/Q scan or CT scan of the chest and a bit performed at this time due to the patient's morbid obesity status. Recent Doppler evaluation of both lower extremities on 1118 was negative for DVT. 2-D echo is pending. Chest x-ray does show respiratory failure. Her BNP is significantly elevated. Cardiology is to consult the patient.  4. Leukocytosis, status post Neulasta on 03/08/2013.  monitor  Other medical issues including possible sepsis secondary to cellulitis, with cultures pending, lactic acidosis,are being addressed by the proper team . Appreciate all the care provided by all the involved specialties.   Marlowe Kays, PA-C/ Dr. Welton Flakes  03/12/2013, 12:07 PM  ATTENDING'S ATTESTATION:  I personally reviewed patient's chart, examined patient myself, formulated the treatment plan as followed.    The patient is well-known to me. She has a history of metastatic, recurrent uterine cancer. Currently she's been receiving palliative Taxol carboplatinum. Her last cycle was about one to 2 weeks ago. She is now admitted with loss of consciousness. She has been having a lot of pain and has been taking pain medications. Question really arises whether she accidentally took too much.  Patient recently did have a kyphoplasty performed but in spite of that she continue to have  pain. I had previously question her mother this was due to her spondylosis. We ruled out metastatic disease to the bones. Patient certainly will need orthopedics as well as neurosurgeon for further management.  I appreciate every once help in this very complicated patient. I will continue to follow her during her hospitalization as needed.  Drue Second, MD Medical/Oncology [facility] [phone] (beeper) [phone] (Office)

## 2013-03-12 NOTE — Progress Notes (Signed)
Pt. arrived on CPAP from [facility], continued on CPAP, weaned to n/c, RT to monitor.

## 2013-03-12 NOTE — Progress Notes (Signed)
ANTIBIOTIC CONSULT NOTE - INITIAL  Pharmacy Consult for vancomycin and Zosyn Indication: rule out sepsis  No Known Allergies  Patient Measurements: Height: 5\' 11"  (180.3 cm) Weight: 424 lb 13.2 oz (192.7 kg) IBW/kg (Calculated) : 70.8 Adjusted Body Weight: 120kg  Vital Signs: Temp: 97.7 F (36.5 C) (11/20 1345) Temp src: Oral (11/20 1345) BP: 97/62 mmHg (11/20 1345) Pulse Rate: 104 (11/20 1345) Intake/Output from previous day:   Intake/Output from this shift:    Labs:  Recent Labs  03/10/13 1116 03/10/13 1116 03/12/13 0845  WBC 9.0  --  15.3*  HGB 9.4*  --  9.4*  PLT 259  --  384  CREATININE  --  0.7 0.89   Estimated Creatinine Clearance: 150.7 ml/min (by C-G formula based on Cr of 0.89). No results found for this basename: VANCOTROUGH, VANCOPEAK, VANCORANDOM, GENTTROUGH, GENTPEAK, GENTRANDOM, TOBRATROUGH, TOBRAPEAK, TOBRARND, AMIKACINPEAK, AMIKACINTROU, AMIKACIN,  in the last 72 hours   Microbiology: No results found for this or any previous visit (from the past 720 hour(s)).  Medical History: Past Medical History  Diagnosis Date  . Obesity   . DVT (deep venous thrombosis) 2009  . PONV (postoperative nausea and vomiting)   . Swelling     BOTH LEGS  . Rash     LOWER ABD  . Gallstones   . Ascites   . Uterine cancer     Assessment: 55 YOF with endometrial cancer currently on chemotherapy admitted with AMS. To start broad spectrum antibiotics with vancomycin and Zosyn for possible sepsis. Lactic acid is elevated, no clear source ? Cellulitis. SCr 0.89, normalized CrCl ~5mL/min. Blood and urine cultures sent.  Goal of Therapy:  Vancomycin trough level 10-15 mcg/ml  Plan:  1. Zosyn 3.375gm IV q8h EI 2. Loading dose of vancomycin with 2500mg  IV x1 3. Vancomycin maintenance dose of 1500mg  IV q12h per obesity nomogram 4. Follow renal function, clinical progression, c/s, LOT, trough at [facility]  Ilee Randleman D. Tess Potts, PharmD, BCPS Clinical Pharmacist Pager:  [phone] 03/12/2013 2:28 PM

## 2013-03-12 NOTE — Progress Notes (Signed)
Utilization Review Completed.[name] T11/20/2014  

## 2013-03-12 NOTE — ED Provider Notes (Addendum)
CSN: [id number]     Arrival date & time 03/12/13  0818 History   First MD Initiated Contact with Patient 03/12/13 0820     Chief Complaint  Patient presents with  . Altered Mental Status   (Consider location/radiation/quality/duration/timing/severity/associated sxs/prior Treatment) Patient is a 47 y.o. female presenting with altered mental status. The history is provided by the EMS personnel and the patient. The history is limited by the condition of the patient.  Altered Mental Status pt with hx metastatic endometrial ca, on chemo, due for chemo today, presents from home via ems w altered mental status.  ems notes pt was heard up and around by family at 2 am. This morning they tried to awaken from bed and indicated pt unresponsive, very lethargic.  ems states initial rr 10, noted report of possible taking muscle relaxers last night and on narcotic pain meds at home, gave narcan iv with some improvement in resp rate and alertness. Pt not verbally responsive on arrival to ed  - level 5 caveat.       Past Medical History  Diagnosis Date  . Obesity   . DVT (deep venous thrombosis) 2009  . PONV (postoperative nausea and vomiting)   . Swelling     BOTH LEGS  . Rash     LOWER ABD  . Gallstones   . Ascites   . Uterine cancer    Past Surgical History  Procedure Laterality Date  . Ankle surgery  1950  . Wisdom tooth extraction    . Tonsillectomy and adenoidectomy    . Hysteroscopy w/d&c    . Abdominal hysterectomy  12/2009    [facility], [facility]  . Laparoscopy N/A 10/02/2012    Procedure: LAPAROSCOPY DIAGNOSTIC, perocentisis, omental biopsy;  Surgeon: Wilmon Arms. Tsuei, MD;  Location: [facility];  Service: General;  Laterality: N/A;  removed a total of 15,000 of acities  . Paracentesis    . Kyphoplasty  02/26/2013   Family History  Problem Relation Age of Onset  . Heart disease Father   . Diabetes Brother   . Heart disease Mother    History  Substance Use Topics  . Smoking status: Never Smoker    . Smokeless tobacco: Never Used  . Alcohol Use: Yes     Comment: social, Occassionally   OB History   Grav Para Term Preterm Abortions TAB SAB Ect Mult Living                 Review of Systems  Unable to perform ROS: Mental status change  level 5 caveat, pt unresponsive.      Allergies  Review of patient's allergies indicates no known allergies.  Home Medications   Current Outpatient Rx  Name  Route  Sig  Dispense  Refill  . Ascorbic Acid (VITAMIN C) 1000 MG tablet   Oral   Take 1,000 mg by mouth daily.         Marland Kitchen CARAFATE 1 GM/10ML suspension      TAKE 10 MLS BY MOUTH 4 TIMES DAILY   420 mL   2   . CARBOPLATIN IV   Intravenous   Inject 1 each into the vein every 21 ( twenty-one) days.         . clindamycin (CLEOCIN) 300 MG capsule   Oral   Take 1 capsule (300 mg total) by mouth 3 (three) times daily.   21 capsule   0   . Cyanocobalamin (VITAMIN B-12 PO)   Oral   Take 2,500  mg by mouth daily.          . cyclobenzaprine (FLEXERIL) 5 MG tablet   Oral   Take 1 tablet (5 mg total) by mouth 3 (three) times daily as needed for muscle spasms.   60 tablet   1   . ferrous sulfate 325 (65 FE) MG tablet      TAKE 1 TABLET BY MOUTH 3 TIMES DAILY WITH MEALS   90 tablet   0   . fluconazole (DIFLUCAN) 100 MG tablet   Oral   Take 1 tablet (100 mg total) by mouth daily.   5 tablet   0   . folic acid (FOLVITE) 400 MCG tablet   Oral   Take 400 mcg by mouth daily.           Marland Kitchen lidocaine-prilocaine (EMLA) cream   Topical   Apply topically as needed. Apply to port-a-cath site 1-2 hours prior to treatment.   30 g   2   . loratadine (CLARITIN) 10 MG tablet   Oral   Take 10 mg by mouth daily.         Marland Kitchen LORazepam (ATIVAN) 0.5 MG tablet   Oral   Take 1 tablet (0.5 mg total) by mouth every 6 (six) hours as needed (Nausea or vomiting).   60 tablet   0   . magnesium gluconate (MAGONATE) 500 MG tablet   Oral   Take 500 mg by mouth daily.          . Multiple Vitamins-Minerals (MULTIVITAMIN WITH MINERALS) tablet   Oral   Take 1 tablet by mouth daily.          Marland Kitchen nystatin ointment (MYCOSTATIN)   Topical   Apply 1 application topically 2 (two) times daily.   30 g   1   . ondansetron (ZOFRAN) 8 MG tablet   Oral   Take 1 tablet (8 mg total) by mouth 2 (two) times daily. Take two times a day starting the day after chemo for 3 days. Then take two times a day as needed for nausea or vomiting.   30 tablet   1   . oxyCODONE-acetaminophen (PERCOCET) 10-325 MG per tablet   Oral   Take 1 tablet by mouth every 4 (four) hours as needed for pain.   90 tablet   0   . PACLitaxel (TAXOL IV)   Intravenous   Inject 1 each into the vein every 21 ( twenty-one) days.         . pantoprazole (PROTONIX) 40 MG tablet   Oral   Take 1 tablet (40 mg total) by mouth daily.   30 tablet   6   . pegfilgrastim (NEULASTA) 6 MG/0.6ML injection   Subcutaneous   Inject 6 mg into the skin every 21 ( twenty-one) days.         . potassium chloride (K-DUR) 10 MEQ tablet   Oral   Take 2 tablets (20 mEq total) by mouth 2 (two) times daily.   12 tablet   1   . potassium chloride SA (K-DUR,KLOR-CON) 20 MEQ tablet   Oral   Take 2 tablets (40 mEq total) by mouth 2 (two) times daily. For 7 days.   14 tablet   3   . PRESCRIPTION MEDICATION      every 21 ( twenty-one) days. CHEMOTHERAPY REGIMEN         . prochlorperazine (COMPAZINE) 10 MG tablet   Oral   Take 1 tablet (10 mg total)  by mouth every 6 (six) hours as needed (Nausea or vomiting).   30 tablet   1   . promethazine (PHENERGAN) 12.5 MG tablet   Oral   Take 1 tablet (12.5 mg total) by mouth every 6 (six) hours as needed for nausea.   30 tablet   0   . triamterene-hydrochlorothiazide (MAXZIDE-25) 37.5-25 MG per tablet   Oral   Take 1 tablet by mouth every morning.          Marland Kitchen UNABLE TO FIND      Heavy duty rolling walker  Diagnosis: Endometrial cancer 182.0; Ascites 789.59;  severe fatigue 780.79   1 Units   0   . UNABLE TO FIND      Super heavy duty wheelchair with anit-tippers and leg rests with heel loops  Diagnosis: Endometrial cancer 182.0; Ascites 789.59; severe fatigue 780.79, thoracic compression fractures 733.13   1 Units   0    Wt 435 lb (197.315 kg)  SpO2 60% Physical Exam  Nursing note and vitals reviewed. Constitutional: She appears well-developed and well-nourished. She appears distressed.  HENT:  Head: Atraumatic.  Eyes: Conjunctivae are normal. Pupils are equal, round, and reactive to light. No scleral icterus.  Neck: Normal range of motion. Neck supple. No tracheal deviation present.  No stiffness or rigidity  Cardiovascular: Normal rate, regular rhythm, normal heart sounds and intact distal pulses.   Pulmonary/Chest: Breath sounds normal. She is in respiratory distress.  Abdominal: Soft. Normal appearance and bowel sounds are normal. She exhibits no distension and no mass. There is no tenderness. There is no rebound and no guarding.  Morbidly obese  Genitourinary:  Normal ext exam.   Musculoskeletal: She exhibits no tenderness.  No gross deformity noted. Morbidly obese.  Chronic skin changes bil lower legs, w area cracked/dry skin right lower leg laterally, w mild inc warmth and erythema. Distal pulses palp.    Neurological:  Will not open eyes to voice or command. Does not follow commands. Moves bil ext spontaneously. Clears throat, coughs.   Skin: Skin is warm and dry. No rash noted.  Psychiatric:  Lethargic, unresponsive.     ED Course  Procedures (including critical care time)  Results for orders placed during the hospital encounter of 03/12/13  CBC      Result Value Range   WBC 15.3 (*) 4.0 - 10.5 K/uL   RBC 2.65 (*) 3.87 - 5.11 MIL/uL   Hemoglobin 9.4 (*) 12.0 - 15.0 g/dL   HCT 16.1 (*) 09.6 - 04.5 %   MCV 113.6 (*) 78.0 - 100.0 fL   MCH 35.5 (*) 26.0 - 34.0 pg   MCHC 31.2  30.0 - 36.0 g/dL   RDW 40.9 (*) 81.1  - 15.5 %   Platelets 384  150 - 400 K/uL  COMPREHENSIVE METABOLIC PANEL      Result Value Range   Sodium 138  135 - 145 mEq/L   Potassium 3.9  3.5 - 5.1 mEq/L   Chloride 96  96 - 112 mEq/L   CO2 21  19 - 32 mEq/L   Glucose, Bld 207 (*) 70 - 99 mg/dL   BUN 18  6 - 23 mg/dL   Creatinine, Ser 9.14  0.50 - 1.10 mg/dL   Calcium 8.9  8.4 - 78.2 mg/dL   Total Protein 6.3  6.0 - 8.3 g/dL   Albumin 2.6 (*) 3.5 - 5.2 g/dL   AST 42 (*) 0 - 37 U/L   ALT 53 (*) 0 -  35 U/L   Alkaline Phosphatase 179 (*) 39 - 117 U/L   Total Bilirubin 0.5  0.3 - 1.2 mg/dL   GFR calc non Af Amer 77 (*) >90 mL/min   GFR calc Af Amer 89 (*) >90 mL/min  ETHANOL      Result Value Range   Alcohol, Ethyl (B) <11  0 - 11 mg/dL  TROPONIN I      Result Value Range   Troponin I <0.30  <0.30 ng/mL  PRO B NATRIURETIC PEPTIDE      Result Value Range   Pro B Natriuretic peptide (BNP) 2322.0 (*) 0 - 125 pg/mL  PROTIME-INR      Result Value Range   Prothrombin Time 14.7  11.6 - 15.2 seconds   INR 1.17  0.00 - 1.49  GLUCOSE, CAPILLARY      Result Value Range   Glucose-Capillary 210 (*) 70 - 99 mg/dL  CG4 I-STAT (LACTIC ACID)      Result Value Range   Lactic Acid, Venous 9.74 (*) 0.5 - 2.2 mmol/L  POCT I-STAT 3, BLOOD GAS (G3+)      Result Value Range   pH, Arterial 7.367  7.350 - 7.450   pCO2 arterial 45.0  35.0 - 45.0 mmHg   pO2, Arterial 69.0 (*) 80.0 - 100.0 mmHg   Bicarbonate 25.8 (*) 20.0 - 24.0 mEq/L   TCO2 27  0 - 100 mmol/L   O2 Saturation 93.0     Patient temperature 98.6 F     Collection site RADIAL, ALLEN'S TEST ACCEPTABLE     Drawn by RT     Sample type ARTERIAL     Ct Head Wo Contrast  03/12/2013   CLINICAL DATA:  Altered mental status.  EXAM: CT HEAD WITHOUT CONTRAST  TECHNIQUE: Contiguous axial images were obtained from the base of the skull through the vertex without contrast.  COMPARISON:  None.  FINDINGS: No evidence for acute infarction, hemorrhage, mass lesion, hydrocephalus, or extra-axial  fluid. Premature for age cerebral atrophy. Chronic microvascular ischemic change is noted in the periventricular and subcortical white matter. No osseous lesions. Right maxillary and right ethmoid sinus disease. Grossly negative orbits. Nonspecific right mastoid fluid. Increased body habitus. Intubated.  IMPRESSION: Premature for age cerebral atrophy.  No acute intracranial findings.   Electronically Signed   By: Davonna Belling M.D.   On: 03/12/2013 10:07   Dg Chest Port 1 View  03/12/2013   CLINICAL DATA:  Shortness of breath, respiratory distress  EXAM: PORTABLE CHEST - 1 VIEW  COMPARISON:  02/02/2013.  FINDINGS: The chest is rotated towards the right. There is bilateral diffuse interstitial thickening likely secondary to low lung volumes and technique. There is no pleural effusion or focal consolidation.  The cardiac silhouette is mildly enlarged. There is a right-sided Port-A-Cath projecting over the SVC.  IMPRESSION: Accounting for the differences in technique, there is no significant interval change. No acute cardiopulmonary disease. .   Electronically Signed   By: Elige Ko   On: 03/12/2013 08:58   Ir Vertebroplasty Or Sacroplasty  03/02/2013   CLINICAL DATA:  Patient with severely painful compression fracture at T10 and T11. History of endometrial carcinoma. On chemotherapy.  EXAM: VERTEBROPLASTY FL AT T10 and T11.  CORE BIOPSIES OBTAINED  MEDICATIONS: Versed 4 mg. mg IV, Fentanyl 100  mcg IV.  ANESTHESIA/SEDATION: Total Moderate Sedation Time:  35 min.  FLUOROSCOPY TIME:  8 min 48 seconds. Marland Kitchen  PROCEDURE: Following a full explanation of the procedure along  with the potentially associated complications, a witnessed informed consent was obtained.  The patient was placed prone on the fluoroscopic table. Nasal oxygen was administered. Physiologic monitoring was performed throughout the duration of the procedure. The skin overlying the thoracic region was prepped and draped in the usual sterile fashion.  The T10 and T11 vertebral bodies were identified and the right pedicle at T10, and the left pedicle at T11 were infiltrated with 0.25% Bupivacaine. This was then followed by the advancement of a 13-gauge Cook needle through both the pedicles into the anterior one-third at both levels. Prior to this, 18 gauge core biopsy needles were used to obtain 4 samples of both the levels using a 20 mL syringe. These were sent for pathologic analysis. A gentle contrast injection demonstrated a trabecular pattern of contrast.  At this time, methylmethacrylate mixture was reconstituted. Under biplane intermittent fluoroscopy, the methylmethacrylate was then injected into the T10 and T11 vertebral body with filling of the vertebral body at T10 and T12.  Mild extravasation was noted into the disk spaces through the inferior endplate fracture clefts . No epidural venous contamination was seen.  The needles were then removed. Hemostasis was achieved at the skin entry site.  There were no acute complications. Patient tolerated the procedure well. The patient was observed for 3 hours and discharged in good condition.  IMPRESSION: Status post vertebral body augmentation for painful compression fracture at T10 and T11 using vertebroplasty technique.  Core biopsies obtained at T10 and T11. Sample sent for pathologic analysis.   Electronically Signed   By: Julieanne Cotton M.D.   On: 02/26/2013 13:10      EKG Interpretation   None       MDM   Iv ns. Continuous pulse ox and monitor.  o2 [place].  resp therapy called-  Will try to assist resp w bipap.  Narcan iv.   Reviewed nursing notes and prior charts for additional history.    Date: 03/12/2013  Rate: 115  Rhythm: sinus tachycardia  QRS Axis: normal  Intervals: normal  ST/T Wave abnormalities: nonspecific ST/T changes  Conduction Disutrbances:none  Narrative Interpretation:   Old EKG Reviewed: changes noted  Family arrives, states recent visit to [facility] 2 days  ago, states only change was addition of abx to normal meds for possible right lower leg cellulitis.  Family states dopplers neg for dvt.  Family also states that yesterday pt was very drowsy, stayed in bed most of day, snoring for most of day, they were able to rouse pt to eat and give meds, but was much sleepier than normal. They note occasional non prod cough, no fevers, and state pt had no new physical c/o throughout the day yesterday or last night.  Family states pt has been taking ativan, oxycodone and muscle relaxer for pain, but that these are not new meds, and pt/family deny over use of these meds.   Pt noted to appear more awake and alert post narcan.   Pt weaned off bipap. Sitting upright in bed.  Conversant. Denies pain or new c/o.  Is asking for water/drink.   Pt/fam state last chemo was 3 weeks ago. Deny recent fever or chills.  Med service called for admission.  CRITICAL CARE  RE altered mental status, unresponsiveness, respiratory distress, bipap Performed by: Suzi Roots Total critical care time: 45 Critical care time was exclusive of separately billable procedures and treating other patients. Critical care was necessary to treat or prevent imminent or life-threatening deterioration. Critical care  was time spent personally by me on the following activities: development of treatment plan with patient and/or surrogate as well as nursing, discussions with consultants, evaluation of patient's response to treatment, examination of patient, obtaining history from patient or surrogate, ordering and performing treatments and interventions, ordering and review of laboratory studies, ordering and review of radiographic studies, pulse oximetry and re-evaluation of patient's condition.  Discussed w [facility], states team 10, stepdown.  Recheck hr 103, rr 16, pulse ox 99%.     Suzi Roots, MD 03/12/13 1022  Suzi Roots, MD 03/12/13 [phone]

## 2013-03-12 NOTE — ED Notes (Signed)
cbg 210 

## 2013-03-13 ENCOUNTER — Ambulatory Visit: Payer: BC Managed Care – PPO

## 2013-03-13 ENCOUNTER — Telehealth: Payer: Self-pay | Admitting: *Deleted

## 2013-03-13 ENCOUNTER — Inpatient Hospital Stay (HOSPITAL_COMMUNITY): Admission: RE | Admit: 2013-03-13 | Payer: BC Managed Care – PPO | Source: Ambulatory Visit

## 2013-03-13 ENCOUNTER — Inpatient Hospital Stay (HOSPITAL_COMMUNITY): Payer: BC Managed Care – PPO

## 2013-03-13 DIAGNOSIS — E860 Dehydration: Secondary | ICD-10-CM | POA: Diagnosis present

## 2013-03-13 DIAGNOSIS — I4891 Unspecified atrial fibrillation: Secondary | ICD-10-CM

## 2013-03-13 DIAGNOSIS — Z9889 Other specified postprocedural states: Secondary | ICD-10-CM

## 2013-03-13 DIAGNOSIS — L03115 Cellulitis of right lower limb: Secondary | ICD-10-CM

## 2013-03-13 DIAGNOSIS — G4733 Obstructive sleep apnea (adult) (pediatric): Secondary | ICD-10-CM | POA: Diagnosis present

## 2013-03-13 DIAGNOSIS — N390 Urinary tract infection, site not specified: Secondary | ICD-10-CM | POA: Diagnosis present

## 2013-03-13 HISTORY — DX: Cellulitis of right lower limb: L03.115

## 2013-03-13 LAB — BASIC METABOLIC PANEL
BUN: 20 mg/dL (ref 6–23)
CO2: 27 mEq/L (ref 19–32)
Chloride: 99 mEq/L (ref 96–112)
Creatinine, Ser: 0.68 mg/dL (ref 0.50–1.10)
Glucose, Bld: 154 mg/dL — ABNORMAL HIGH (ref 70–99)

## 2013-03-13 LAB — CBC
HCT: 24.5 % — ABNORMAL LOW (ref 36.0–46.0)
Hemoglobin: 7.9 g/dL — ABNORMAL LOW (ref 12.0–15.0)
MCHC: 32.2 g/dL (ref 30.0–36.0)
MCV: 109.4 fL — ABNORMAL HIGH (ref 78.0–100.0)
RDW: 21.6 % — ABNORMAL HIGH (ref 11.5–15.5)

## 2013-03-13 MED ORDER — SODIUM CHLORIDE 0.9 % IV SOLN: 25.0000 mg | Freq: Once | INTRAVENOUS | Status: DC

## 2013-03-13 MED ORDER — SODIUM CHLORIDE 0.9 % IV SOLN
500.0000 mg | Freq: Every day | INTRAVENOUS | Status: AC
  Administered 2013-03-14 – 2013-03-15 (×2): 500 mg via INTRAVENOUS
  Filled 2013-03-13 (×4): qty 10

## 2013-03-13 MED ORDER — CYCLOBENZAPRINE HCL 5 MG PO TABS
5.0000 mg | ORAL_TABLET | Freq: Three times a day (TID) | ORAL | Status: DC | PRN
  Administered 2013-03-13 – 2013-03-15 (×3): 5 mg via ORAL
  Filled 2013-03-13 (×3): qty 1

## 2013-03-13 MED ORDER — SODIUM CHLORIDE 0.9 % IJ SOLN
10.0000 mL | INTRAMUSCULAR | Status: DC | PRN
  Administered 2013-03-14 – 2013-03-15 (×2): 10 mL
  Administered 2013-03-15: 30 mL
  Administered 2013-03-16: 10 mL

## 2013-03-13 MED ORDER — SODIUM CHLORIDE 0.9 % IV SOLN: 250.0000 mg | Freq: Every day | INTRAVENOUS | Status: DC

## 2013-03-13 MED ORDER — FOLIC ACID 1 MG PO TABS
1.0000 mg | ORAL_TABLET | Freq: Every day | ORAL | Status: DC
  Administered 2013-03-13 – 2013-03-16 (×4): 1 mg via ORAL
  Filled 2013-03-13 (×4): qty 1

## 2013-03-13 MED ORDER — VANCOMYCIN HCL 10 G IV SOLR
1500.0000 mg | Freq: Two times a day (BID) | INTRAVENOUS | Status: DC
  Administered 2013-03-13 – 2013-03-14 (×2): 1500 mg via INTRAVENOUS
  Filled 2013-03-13 (×4): qty 1500

## 2013-03-13 MED ORDER — OXYCODONE HCL 5 MG PO TABS
5.0000 mg | ORAL_TABLET | ORAL | Status: DC | PRN
  Administered 2013-03-15: 5 mg via ORAL
  Filled 2013-03-13: qty 1

## 2013-03-13 MED ORDER — OXYCODONE-ACETAMINOPHEN 5-325 MG PO TABS
1.0000 | ORAL_TABLET | ORAL | Status: DC | PRN
  Administered 2013-03-14 – 2013-03-15 (×3): 1 via ORAL
  Filled 2013-03-13 (×3): qty 1

## 2013-03-13 MED ORDER — OXYCODONE-ACETAMINOPHEN 10-325 MG PO TABS: 1.0000 | ORAL_TABLET | ORAL | Status: DC | PRN

## 2013-03-13 MED ORDER — NAPROXEN SODIUM 275 MG PO TABS: 440.0000 mg | ORAL_TABLET | Freq: Two times a day (BID) | ORAL | Status: DC

## 2013-03-13 MED ORDER — FERROUS SULFATE 325 (65 FE) MG PO TABS
325.0000 mg | ORAL_TABLET | Freq: Three times a day (TID) | ORAL | Status: DC
  Administered 2013-03-16 (×2): 325 mg via ORAL
  Filled 2013-03-13 (×4): qty 1

## 2013-03-13 MED ORDER — DM-GUAIFENESIN ER 30-600 MG PO TB12
1.0000 | ORAL_TABLET | Freq: Two times a day (BID) | ORAL | Status: DC
  Administered 2013-03-13 – 2013-03-16 (×7): 1 via ORAL
  Filled 2013-03-13 (×8): qty 1

## 2013-03-13 MED ORDER — FOLIC ACID 400 MCG PO TABS: 400.0000 ug | ORAL_TABLET | Freq: Every day | ORAL | Status: DC

## 2013-03-13 MED ORDER — PIPERACILLIN-TAZOBACTAM 3.375 G IVPB 30 MIN
3.3750 g | Freq: Three times a day (TID) | INTRAVENOUS | Status: DC
  Administered 2013-03-13 – 2013-03-14 (×2): 3.375 g via INTRAVENOUS
  Filled 2013-03-13 (×4): qty 50

## 2013-03-13 MED ORDER — NAPROXEN 375 MG PO TABS
375.0000 mg | ORAL_TABLET | Freq: Two times a day (BID) | ORAL | Status: DC
  Administered 2013-03-13 – 2013-03-16 (×7): 375 mg via ORAL
  Filled 2013-03-13 (×10): qty 1

## 2013-03-13 MED ORDER — SUCRALFATE 1 GM/10ML PO SUSP
1.0000 g | Freq: Three times a day (TID) | ORAL | Status: DC
  Administered 2013-03-13 – 2013-03-16 (×12): 1 g via ORAL
  Filled 2013-03-13 (×18): qty 10

## 2013-03-13 MED ORDER — FERROUS SULFATE 325 (65 FE) MG PO TABS
325.0000 mg | ORAL_TABLET | Freq: Three times a day (TID) | ORAL | Status: DC
  Filled 2013-03-13 (×3): qty 1

## 2013-03-13 MED ORDER — PANTOPRAZOLE SODIUM 40 MG PO TBEC
40.0000 mg | DELAYED_RELEASE_TABLET | Freq: Every day | ORAL | Status: DC
  Administered 2013-03-13 – 2013-03-16 (×4): 40 mg via ORAL
  Filled 2013-03-13 (×4): qty 1

## 2013-03-13 MED ORDER — POTASSIUM CHLORIDE CRYS ER 20 MEQ PO TBCR
40.0000 meq | EXTENDED_RELEASE_TABLET | Freq: Once | ORAL | Status: AC
  Administered 2013-03-13: 40 meq via ORAL
  Filled 2013-03-13: qty 2

## 2013-03-13 MED ORDER — FLUCONAZOLE 100 MG PO TABS
100.0000 mg | ORAL_TABLET | Freq: Every day | ORAL | Status: DC
  Administered 2013-03-13 – 2013-03-16 (×4): 100 mg via ORAL
  Filled 2013-03-13 (×4): qty 1

## 2013-03-13 MED ORDER — SODIUM CHLORIDE 0.9 % IV SOLN
25.0000 mg | Freq: Once | INTRAVENOUS | Status: AC
  Administered 2013-03-13: 25 mg via INTRAVENOUS
  Filled 2013-03-13: qty 0.5

## 2013-03-13 NOTE — Progress Notes (Signed)
  Echocardiogram 2D Echocardiogram has been performed.  [place] Co 03/13/2013, 10:55 AM

## 2013-03-13 NOTE — Progress Notes (Signed)
PULMONARY  / CRITICAL CARE MEDICINE  Name: [name] MRN: [id number] DOB: 01-Sep-1965    ADMISSION DATE:  03/12/2013 CONSULTATION DATE:  03/12/13  REFERRING MD :  Denton Lank PRIMARY SERVICE: [facility]   CHIEF COMPLAINT:  Lactic acidosis, resp distress   BRIEF PATIENT DESCRIPTION: 47yo morbidly obese female with hx recurrent metastatic endometrial cancer with current chemotherapy and recent IR vertebroplasty (11/6) r/t compression fx.  She presented 11/20 after being found unresponsive in bed at home, mental status improved with narcan per EMS.    SIGNIFICANT EVENTS / STUDIES:  11/6 IR verterbroplasty 11/20 CT head>>> neg   LINES / TUBES: PIV  CULTURES: Urine 11/20>>> Blood 11/20>>>  ANTIBIOTICS: Clindamycin (outpt) 11/18>>>11/20 Vancomycin 11/20>>> Zosyn 11/20>>>  VITAL SIGNS: Temp:  [97.7 F (36.5 C)-98.4 F (36.9 C)] 97.9 F (36.6 C) (11/21 0800) Pulse Rate:  [99-109] 99 (11/21 0357) Resp:  [16-21] 19 (11/21 0357) BP: (84-106)/(49-67) 103/64 mmHg (11/21 0357) SpO2:  [96 %-100 %] 98 % (11/21 0357) Weight:  [192.7 kg (424 lb 13.2 oz)] 192.7 kg (424 lb 13.2 oz) (11/20 1345) HEMODYNAMICS:   VENTILATOR SETTINGS:   INTAKE / OUTPUT: Intake/Output     11/20 0701 - 11/21 0700 11/21 0701 - 11/22 0700   I.V. (mL/kg) 645.5 (3.3)    IV Piggyback 1150    Total Intake(mL/kg) 1795.5 (9.3)    Urine (mL/kg/hr) 250    Total Output 250     Net +1545.5          Stool Occurrence 1 x     PHYSICAL EXAMINATION: General:  Obese, chronically ill appearing female, agitated on bedside commode. Neuro:  Awake, alert, appropriate, MAE  HEENT:  Mm moist, no JVD, NRB Cardiovascular:  s1s2 distant  Lungs:  resps even non labored, diminished throughout  Abdomen:  Obese, soft, +bs Musculoskeletal:  Warm and dry, BLE venous stasis, RLE erythematous, warm  LABS:  CBC  Recent Labs Lab 03/12/13 0845 03/12/13 1618 03/13/13 0428  WBC 15.3* 14.5* 11.8*  HGB 9.4* 8.6* 7.9*  HCT 30.1*  25.8* 24.5*  PLT 384 314 331   Coag's  Recent Labs Lab 03/12/13 0845  INR 1.17   BMET  Recent Labs Lab 03/10/13 1116 03/12/13 0845 03/12/13 1618 03/13/13 0428  NA 139 138  --  137  K 3.1* 3.9  --  3.4*  CL  --  96  --  99  CO2 23 21  --  27  BUN 10.9 18  --  20  CREATININE 0.7 0.89 1.02 0.68  GLUCOSE 172* 207*  --  154*   Electrolytes  Recent Labs Lab 03/10/13 1116 03/12/13 0845 03/12/13 1618 03/13/13 0428  CALCIUM 9.1 8.9  --  8.0*  MG  --   --  2.1  --   PHOS  --   --  4.0  --    Sepsis Markers  Recent Labs Lab 03/12/13 0855 03/12/13 1618  LATICACIDVEN 9.74* 1.8   ABG  Recent Labs Lab 03/12/13 0942  PHART 7.367  PCO2ART 45.0  PO2ART 69.0*   Liver Enzymes  Recent Labs Lab 03/10/13 1116 03/12/13 0845  AST 17 42*  ALT 43 53*  ALKPHOS 155* 179*  BILITOT 0.61 0.5  ALBUMIN 2.7* 2.6*   Cardiac Enzymes  Recent Labs Lab 03/12/13 0845  TROPONINI <0.30  PROBNP 2322.0*   Glucose  Recent Labs Lab 03/12/13 0829  GLUCAP 210*    Imaging Ct Head Wo Contrast  03/12/2013   CLINICAL DATA:  Altered mental status.  EXAM: CT HEAD WITHOUT CONTRAST  TECHNIQUE: Contiguous axial images were obtained from the base of the skull through the vertex without contrast.  COMPARISON:  None.  FINDINGS: No evidence for acute infarction, hemorrhage, mass lesion, hydrocephalus, or extra-axial fluid. Premature for age cerebral atrophy. Chronic microvascular ischemic change is noted in the periventricular and subcortical white matter. No osseous lesions. Right maxillary and right ethmoid sinus disease. Grossly negative orbits. Nonspecific right mastoid fluid. Increased body habitus. Intubated.  IMPRESSION: Premature for age cerebral atrophy.  No acute intracranial findings.   Electronically Signed   By: Davonna Belling M.D.   On: 03/12/2013 10:07   Dg Chest Port 1 View  03/12/2013   CLINICAL DATA:  Shortness of breath, respiratory distress  EXAM: PORTABLE CHEST - 1 VIEW   COMPARISON:  02/02/2013.  FINDINGS: The chest is rotated towards the right. There is bilateral diffuse interstitial thickening likely secondary to low lung volumes and technique. There is no pleural effusion or focal consolidation.  The cardiac silhouette is mildly enlarged. There is a right-sided Port-A-Cath projecting over the SVC.  IMPRESSION: Accounting for the differences in technique, there is no significant interval change. No acute cardiopulmonary disease. .   Electronically Signed   By: Elige Ko   On: 03/12/2013 08:58     ASSESSMENT / PLAN:   AMS  Acute hypoxic respiratory failure  OHS/ presumed OSA  Lactic acidosis   Morbidly obese female with metastatic endometrial cancer with AMS (now resolved) and acute hypoxic respiratory failure in setting presumed underlying OHS/OSA c/b oversedation and Afib/tachycardia.   ??Etiology of lactate=9.  Does have RLE cellulitis but does not appear toxic.    PLAN -  - O2 as needed  - BiPAP for comfort only at this point - Repeat lactic acid is 1.8. - Cont abx for RLE cellulitis (vanc/zosyn) - F/U culture. - Avoid oversedation. - Likely needs outpt sleep eval/PSG once ready for discharge. - HR control.  Sinus Tach - per cards  RLE cellulitis  Anemia  Metastatic endometrial cancer -- oncology following  Per Primary   PCCM will sign off, please call back if needed.  *Care during the described time interval was provided by me and/or other providers on the critical care team. I have reviewed this patient's available data, including medical history, events of note, physical examination and test results as part of my evaluation.  Alyson Reedy, M.D. [facility] Pulmonary/Critical Care Medicine. Pager: [phone]. After hours pager: [phone].

## 2013-03-13 NOTE — Progress Notes (Signed)
Pt. Requesting to be oob on rounds.

## 2013-03-13 NOTE — Progress Notes (Addendum)
Patient ID: [name], female   DOB: [birth year], 47 y.o.   MRN: [id number]  Pt with hx of endometrial ca T10 and T11 vertebroplasty performed in IR 02/26/13 Bx of both vertebrae on 11/6 were read as no malignancy Pt was scheduled for 2 week follow up with Dr Corliss Skains today but is now an in patient.  She has been admitted after found down at home 11/20 am Came thru ER; resp distress; AMS   Mother called Radiology to tell us of her admittance I have seen and examined pt. Still complaining of low back pain  She states this pain has been bothersome x 2-3 months. Pain at T10 and T11 area is gone.  MRI of Lumbar and T spine performed at [facility] on 02/05/13 show no fx in Lumbar area- but does show advanced spondylosis with associated posterior disc protrusions; facet jt arthropathy; spinal canal and neural foraminal stenosis. And the fxs at T10 and T11.  I have discussed case with Dr Corliss Skains and have made him aware of my exam findings: Site of T10/11 VP is NT; no redness; no sign of infection Pt is afeb Area lower in back is tender to palpate- closer to L 3-5 area. No sign of injury.  Rec: Unlikely fracture with neg L spine MRI performed 10/16 with pain being apparent since 12/2012. But if pt still complains or MD would want to further evaluate complaint  Consider new MRI or bone scan.  Mother is aware of this plan and agreeable

## 2013-03-13 NOTE — Progress Notes (Signed)
C/o pain at iv site to lt. Upper arm and rt. Wrist.. So site dced.. Iv team in to eval. For restart.

## 2013-03-13 NOTE — Progress Notes (Signed)
Report called to 6n to [facility]

## 2013-03-13 NOTE — Progress Notes (Signed)
[facility] Progress Note [place] TEAM 1 - Stepdown ICU Team   [name]:[id number] DOB: March 02, 1966 DOA: 03/12/2013 PCP: [name], MD  Brief narrative: 47 y.o. female with a history of endometrial cancer currently on chemotherapy presented to the emergency room with altered mental status. She is accompanied by her mother as well as on. Information details history and physical obtained from mother. The mother woke up this morning and stated that she found her daughter, sleeping and was unable to arouse her. She was due to have her chemotherapy treatment today. Her last chemotherapy treatment was 3 weeks ago. Her mother states that she is having a hard time breathing at which point EMS was called and patient was brought to the emergency department. She was placed on BiPAP. In route EMS the patient is Narcan. While in the emergency department she was also given a second dose of Narcan. Of note pt is morbidly obese and has known OSA. By the time the admitting MD evaluated the pt she was stable on 2L.  Of note the pt has had s/s consistent with cellulitis of RLE and was on anbx's pre admit. Due to LE edema and OP venous duplex had been done in past week but this was reported as negative.  In the ER telemetry was concerning for possible AF noting VRs 150-160. Cardiology consulted and reviewed strips/EKG and felt was sinus tachycardia.   Assessment/Plan: Active Problems:   Lactic acidosis/Dehydration - has been hydrated and has improved -does not appear to be septic and does not meet SIRS criteria either    Metabolic encephalopathy -resolved and likely due to underlying infection in setting of OSA and regular use of narcotics- d/w pt mom and clarified had not been given extra doses of narcs or flexeril    Acute respiratory failure with hypoxia/OSA (obstructive sleep apnea) -stable on 3L -? If uses CPAP at HS at home -could have PE but unable to image due to marked  obesity- see below re: ?? DVT -ECHO without abnormalities or evidence of acute RH strain so low index suspicion initial sx's due to PE -Pulmonary medicine following-recommends OP follow up with PSG/sleep study      Iron deficiency anemia -on oral iron at home -Fe < 10 -will replete via IV 500 mg daily x 2 days starting tomorrow    Cellulitis of right lower extremity -seems associated with superficial thrombophlebitis of lateral calf -continue anbxs-skin discoloration/pattern looks c/w strep -soft tissue ultrasound     E. 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 Titus Dubin see pt- FU appt due 11/21   DVT prophylaxis: Lovenox Code Status: DO NOT RESUSCITATE Family Communication: Patient and mother at bedside Disposition Plan/Expected LOS: Transfer to floor Isolation: None Nutritional Status: Stable  Consultants: Cardiology-signed off Oncology Pulmonary  Procedures: ECHO: Normal  Lower extremity duplex pending  Antibiotics: Zosyn 11/20 >> Vanco 11/20 >>>  HPI/Subjective: Alert. Reports pain right lateral calf. Denies SOB or CP.  Objective: Blood pressure 103/64, pulse 99, temperature 97.9 F (36.6 C), temperature source Oral, resp. rate 19, height 5\' 11"  (1.803 m), weight 424 lb 13.2 oz (192.7 kg), SpO2 98.00%.  Intake/Output Summary (Last 24 hours) at 03/13/13 0941 Last data filed at 03/13/13 0546  Gross per 24 hour  Intake 1795.5 ml  Output    250 ml  Net 1545.5 ml     Exam: General: No acute respiratory distress-morbid obese Lungs: Clear to auscultation  bilaterally without wheezes or crackles, RA Cardiovascular: Regular rate and rhythm without murmur gallop or rub normal S1 and S2, no peripheral edema or JVD Abdomen: Nontender, nondistended, soft, bowel sounds positive, no rebound, no ascites, no appreciable mass Musculoskeletal: No significant cyanosis, clubbing of bilateral lower extremities-web like pattern of streaking and cellulitic skin changes to right lower extremity- has induration over lateral vein path c/w STP Neurological: Alert and oriented x 3, moves all extremities x 4 without focal neurological deficits, CN 2-12 intact  Scheduled Meds:  Scheduled Meds: . dextromethorphan-guaiFENesin  1 tablet Oral BID  . enoxaparin (LOVENOX) injection  0.5 mg/kg Subcutaneous Q24H  . ferrous sulfate  325 mg Oral TID WC  . fluconazole  100 mg Oral Daily  . folic acid  400 mcg Oral Daily  . [START ON 03/14/2013] iron dextran (INFED/DEXFERRUM) infusion  25 mg Intravenous Once   Followed by  . [START ON 03/14/2013] iron dextran (INFED/DEXFERRUM) infusion  250 mg Intravenous Daily  . naproxen  375 mg Oral BID WC  . pantoprazole  40 mg Oral Daily  . piperacillin-tazobactam (ZOSYN)  IV  3.375 g Intravenous Q8H  . potassium chloride  40 mEq Oral Once  . sodium chloride  3 mL Intravenous Q12H  . sucralfate  1 g Oral TID AC & HS  . vancomycin  1,500 mg Intravenous Q12H   Continuous Infusions: . sodium chloride 10 mL/hr at 03/12/13 0857    **Reviewed in detail by the Attending Physician  Data Reviewed: Basic Metabolic Panel:  Recent Labs Lab 03/10/13 1116 03/12/13 0845 03/12/13 1618 03/13/13 0428  NA 139 138  --  137  K 3.1* 3.9  --  3.4*  CL  --  96  --  99  CO2 23 21  --  27  GLUCOSE 172* 207*  --  154*  BUN 10.9 18  --  20  CREATININE 0.7 0.89 1.02 0.68  CALCIUM 9.1 8.9  --  8.0*  MG  --   --  2.1  --   PHOS  --   --  4.0  --    Liver Function Tests:  Recent Labs Lab 03/10/13 1116 03/12/13 0845  AST 17 42*  ALT 43 53*    ALKPHOS 155* 179*  BILITOT 0.61 0.5  PROT 6.0* 6.3  ALBUMIN 2.7* 2.6*   No results found for this basename: LIPASE, AMYLASE,  in the last 168 hours No results found for this basename: AMMONIA,  in the last 168 hours CBC:  Recent Labs Lab 03/10/13 1116 03/12/13 0845 03/12/13 1618 03/13/13 0428  WBC 9.0 15.3* 14.5* 11.8*  NEUTROABS 6.5  --   --   --   HGB 9.4* 9.4* 8.6* 7.9*  HCT 28.0* 30.1* 25.8* 24.5*  MCV 108.1* 113.6* 107.9* 109.4*  PLT 259 384 314 331   Cardiac Enzymes:  Recent Labs Lab 03/12/13 0845  TROPONINI <0.30   BNP (last 3 results)  Recent Labs  03/12/13 0845  PROBNP 2322.0*   CBG:  Recent Labs Lab 03/12/13 0829  GLUCAP 210*    Recent Results (from the past 240 hour(s))  CULTURE, BLOOD (ROUTINE X 2)     Status: None   Collection Time  03/12/13  9:20 AM      Result Value Range Status   Specimen Description BLOOD ARM LEFT   Final   Special Requests BOTTLES DRAWN AEROBIC ONLY 5CC   Final   Culture  Setup Time     Final   Value: 03/12/2013 13:29     Performed at [facility]   Culture     Final   Value:        BLOOD CULTURE RECEIVED NO GROWTH TO DATE CULTURE WILL BE HELD FOR 5 DAYS BEFORE ISSUING A FINAL NEGATIVE REPORT     Performed at [facility]   Report Status PENDING   Incomplete  CULTURE, BLOOD (ROUTINE X 2)     Status: None   Collection Time    03/12/13  9:30 AM      Result Value Range Status   Specimen Description BLOOD HAND LEFT   Final   Special Requests BOTTLES DRAWN AEROBIC ONLY 4CC   Final   Culture  Setup Time     Final   Value: 03/12/2013 13:29     Performed at [facility]   Culture     Final   Value:        BLOOD CULTURE RECEIVED NO GROWTH TO DATE CULTURE WILL BE HELD FOR 5 DAYS BEFORE ISSUING A FINAL NEGATIVE REPORT     Performed at [facility]   Report Status PENDING   Incomplete  MRSA PCR SCREENING     Status: None   Collection Time    03/12/13  2:25 PM      Result Value Range  Status   MRSA by PCR NEGATIVE  NEGATIVE Final   Comment:            The GeneXpert MRSA Assay (FDA     approved for NASAL specimens     only), is one component of a     comprehensive MRSA colonization     surveillance program. It is not     intended to diagnose MRSA     infection nor to guide or     monitor treatment for     MRSA infections.     Studies:  Recent x-ray studies have been reviewed in detail by the Attending Physician     Junious Silk, ANP [facility] Office  [phone] Pager [phone]  **If unable to reach the above provider after paging please contact the Flow Manager @ [phone]  On-Call/Text Page:      [email or web address]      password [email or web address]  If 7PM-7AM, please contact night-coverage [email or web address] Password [facility] 03/13/2013, 9:41 AM   LOS: 1 day   .cos

## 2013-03-13 NOTE — Telephone Encounter (Signed)
Call from [place] from [facility] regarding an appt for pt. Discussed pt surrently in the hospital, admitted on 11/20. Pt to follow up with Thayer Ohm after she is discharged. Call to pt's home # spoke with her Mother Glenys, [place] @ [phone] and gave her the information.

## 2013-03-14 ENCOUNTER — Inpatient Hospital Stay (HOSPITAL_COMMUNITY): Payer: BC Managed Care – PPO

## 2013-03-14 LAB — URINE CULTURE: Colony Count: 60000

## 2013-03-14 LAB — BASIC METABOLIC PANEL
CO2: 27 mEq/L (ref 19–32)
Chloride: 98 mEq/L (ref 96–112)
Creatinine, Ser: 0.79 mg/dL (ref 0.50–1.10)
GFR calc non Af Amer: >90 mL/min (ref >90)
Potassium: 3.3 mEq/L — ABNORMAL LOW (ref 3.5–5.1)
Sodium: 139 mEq/L (ref 135–145)

## 2013-03-14 MED ORDER — FUROSEMIDE 10 MG/ML IJ SOLN
40.0000 mg | Freq: Once | INTRAMUSCULAR | Status: AC
  Administered 2013-03-14: 40 mg via INTRAVENOUS
  Filled 2013-03-14: qty 4

## 2013-03-14 MED ORDER — POTASSIUM CHLORIDE CRYS ER 20 MEQ PO TBCR
40.0000 meq | EXTENDED_RELEASE_TABLET | Freq: Two times a day (BID) | ORAL | Status: DC
  Administered 2013-03-14 – 2013-03-16 (×5): 40 meq via ORAL
  Filled 2013-03-14 (×7): qty 2

## 2013-03-14 MED ORDER — PIPERACILLIN-TAZOBACTAM 3.375 G IVPB
3.3750 g | Freq: Three times a day (TID) | INTRAVENOUS | Status: DC
  Filled 2013-03-14 (×3): qty 50

## 2013-03-14 MED ORDER — DEXTROSE 5 % IV SOLN
2.0000 g | INTRAVENOUS | Status: DC
  Administered 2013-03-14 – 2013-03-15 (×2): 2 g via INTRAVENOUS
  Filled 2013-03-14 (×3): qty 2

## 2013-03-14 NOTE — Progress Notes (Signed)
[facility] PROGRESS NOTE  [name] NWG:[id number] DOB: 1966/03/15 DOA: 03/12/2013 PCP: [name], MD  Brief summary  47 y.o. female with a history of endometrial cancer currently on chemotherapy presented to the emergency room with altered mental status. She is accompanied by her mother as well as on. Information details history and physical obtained from mother. The mother woke up this morning and stated that she found her daughter, sleeping and was unable to arouse her. She was due to have her chemotherapy treatment today. Her last chemotherapy treatment was 3 weeks ago. Her mother states that she is having a hard time breathing at which point EMS was called and patient was brought to the emergency department. She was placed on BiPAP. In route EMS the patient is Narcan. While in the emergency department she was also given a second dose of Narcan. Of note pt is morbidly obese and has known OSA. By the time the admitting MD evaluated the pt she was stable on 2L.  Of note the pt has had s/s consistent with cellulitis of RLE and was on anbx's pre admit. Due to LE edema and OP venous duplex had been done in past week but this was reported as negative.  In the ER telemetry was concerning for possible AF noting VRs 150-160. Cardiology consulted and reviewed strips/EKG and felt was sinus tachycardia.   Assessment/Plan:   1. Lactic acidosis/Dehydration; resolved; currently mild fluid overloaded; one dose of lasix today; replace lytes;   2. Metabolic encephalopathy; resolved and likely due to underlying infection in setting of OSA and regular use of narcotics - d/w pt mom and clarified had not been given extra doses of narcs or flexeril   3. Acute respiratory failure with hypoxia/OSA (obstructive sleep apnea) likely due to narcotic overdose; ? If uses CPAP at HS at home; no s/s oof VTE; ECHO without abnormalities or evidence of acute RH strain so low index suspicion initial sx's due to  PE; [place] neg for DVT;  -Pulmonary medicine following-recommends OP follow up with PSG/sleep study  -fluid overload; give a dose of lasix; replete lytes; f/u BMP in AM   4. Anemia, likely cho induced; as well Iron deficiency anemia on oral iron at home  -Fe < 10; replete via IV 500 mg daily x 2 days starting tomorrow; no s/s of bleeding   5. Cellulitis of right lower extremity; [place] no abscess;  -improving; cont atx  6. E. Coli UTI (urinary tract infection); >60,000 colonies -cont empiric anbx's   7. Obesity - BMI 79  -Bari bed-mobilize   8. 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 History of recent kyphoplasty  -family requests Dr. Titus Dubin see pt- FU appt due 11/21; f/u outpatient    Code Status: NDR Family Communication: mother at the bedside (indicate person spoken with, relationship, and if by phone, the number) Disposition Plan: home in 24-48 hours   Consultants:  PCCM,cardiology, oncology  Procedures:  [place]  Antibiotics:  Zosyn/vanc 11/20<<<<   (indicate start date, and stop date if known)  HPI/Subjective: alert  Objective: Filed Vitals:   03/14/13 0535  BP: 114/64  Pulse: 95  Temp: 98.4 F (36.9 C)  Resp: 18    Intake/Output Summary (Last 24 hours) at 03/14/13 1147 Last data filed at 03/14/13 0325  Gross per 24 hour  Intake    620 ml  Output      0 ml  Net    620 ml   Filed Weights   03/12/13 0825 03/12/13 1345 03/13/13 1217  Weight: 197.315 kg (435 lb) 192.7 kg (424 lb 13.2 oz) 192.7 kg (424 lb 13.2 oz)    Exam:   General:  alert  Cardiovascular: s1,s2 rrr  Respiratory: CTA BL  Abdomen: soft, obese, nt  Musculoskeletal: no edema    Data Reviewed: Basic Metabolic Panel:  Recent Labs Lab 03/10/13 1116 03/12/13 0845  03/12/13 1618 03/13/13 0428  NA 139 138  --  137  K 3.1* 3.9  --  3.4*  CL  --  96  --  99  CO2 23 21  --  27  GLUCOSE 172* 207*  --  154*  BUN 10.9 18  --  20  CREATININE 0.7 0.89 1.02 0.68  CALCIUM 9.1 8.9  --  8.0*  MG  --   --  2.1  --   PHOS  --   --  4.0  --    Liver Function Tests:  Recent Labs Lab 03/10/13 1116 03/12/13 0845  AST 17 42*  ALT 43 53*  ALKPHOS 155* 179*  BILITOT 0.61 0.5  PROT 6.0* 6.3  ALBUMIN 2.7* 2.6*   No results found for this basename: LIPASE, AMYLASE,  in the last 168 hours No results found for this basename: AMMONIA,  in the last 168 hours CBC:  Recent Labs Lab 03/10/13 1116 03/12/13 0845 03/12/13 1618 03/13/13 0428  WBC 9.0 15.3* 14.5* 11.8*  NEUTROABS 6.5  --   --   --   HGB 9.4* 9.4* 8.6* 7.9*  HCT 28.0* 30.1* 25.8* 24.5*  MCV 108.1* 113.6* 107.9* 109.4*  PLT 259 384 314 331   Cardiac Enzymes:  Recent Labs Lab 03/12/13 0845  TROPONINI <0.30   BNP (last 3 results)  Recent Labs  03/12/13 0845  PROBNP 2322.0*   CBG:  Recent Labs Lab 03/12/13 0829  GLUCAP 210*    Recent Results (from the past 240 hour(s))  CULTURE, BLOOD (ROUTINE X 2)     Status: None   Collection Time    03/12/13  9:20 AM      Result Value Range Status   Specimen Description BLOOD ARM LEFT   Final   Special Requests BOTTLES DRAWN AEROBIC ONLY 5CC   Final   Culture  Setup Time     Final   Value: 03/12/2013 13:29     Performed at [facility]   Culture     Final   Value:        BLOOD CULTURE RECEIVED NO GROWTH TO DATE CULTURE WILL BE HELD FOR 5 DAYS BEFORE ISSUING A FINAL NEGATIVE REPORT     Performed at [facility]   Report Status PENDING   Incomplete  CULTURE, BLOOD (ROUTINE X 2)     Status: None   Collection Time    03/12/13  9:30 AM      Result Value Range Status   Specimen Description BLOOD HAND LEFT   Final   Special Requests BOTTLES DRAWN AEROBIC ONLY 4CC   Final   Culture  Setup Time     Final   Value:  03/12/2013 13:29     Performed at [facility]   Culture     Final   Value:        BLOOD CULTURE RECEIVED NO GROWTH TO DATE CULTURE WILL BE  HELD FOR 5 DAYS BEFORE ISSUING A FINAL NEGATIVE REPORT     Performed at [facility]   Report Status PENDING   Incomplete  URINE CULTURE     Status: None   Collection Time    03/12/13 11:57 AM      Result Value Range Status   Specimen Description URINE, CATHETERIZED   Final   Special Requests NONE   Final   Culture  Setup Time     Final   Value: 03/12/2013 18:10     Performed at [facility] Count     Final   Value: 60,000 COLONIES/ML     Performed at [facility]   Culture     Final   Value: ESCHERICHIA COLI     Performed at [facility]   Report Status 03/14/2013 FINAL   Final   Organism ID, Bacteria ESCHERICHIA COLI   Final  MRSA PCR SCREENING     Status: None   Collection Time    03/12/13  2:25 PM      Result Value Range Status   MRSA by PCR NEGATIVE  NEGATIVE Final   Comment:            The GeneXpert MRSA Assay (FDA     approved for NASAL specimens     only), is one component of a     comprehensive MRSA colonization     surveillance program. It is not     intended to diagnose MRSA     infection nor to guide or     monitor treatment for     MRSA infections.     Studies: [place] Extrem Low Right Comp  03/14/2013   CLINICAL DATA:  Clinical concern of a palpable mass right leg  EXAM: RIGHT LOWER EXTREMITY SOFT TISSUE ULTRASOUND COMPLETE  TECHNIQUE: Ultrasound examination was performed including evaluation of the muscles, tendons, joint, and adjacent soft tissues.  COMPARISON:  None.  FINDINGS: No focal solid or cystic sonographic abnormalities identified. There is no evidence of free fluid or loculated fluid collections. Heterogeneity of the subcutaneous tissues is appreciated.  IMPRESSION: 1. Findings which may reflect sequela of cellulitis. 2. No focal abnormalities identified    Electronically Signed   By: Salome Holmes M.D.   On: 03/14/2013 11:21    Scheduled Meds: . dextromethorphan-guaiFENesin  1 tablet Oral BID  . enoxaparin (LOVENOX) injection  0.5 mg/kg Subcutaneous Q24H  . [START ON 03/16/2013] ferrous sulfate  325 mg Oral TID WC  . fluconazole  100 mg Oral Daily  . folic acid  1 mg Oral Daily  . iron dextran (INFED/DEXFERRUM) infusion  500 mg Intravenous Daily  . naproxen  375 mg Oral BID WC  . pantoprazole  40 mg Oral Daily  . piperacillin-tazobactam (ZOSYN)  IV  3.375 g Intravenous Q8H  . sodium chloride  3 mL Intravenous Q12H  . sucralfate  1 g Oral TID AC & HS  . vancomycin  1,500 mg Intravenous Q12H   Continuous Infusions: . sodium chloride 20 mL/hr (03/14/13 0817)    Active Problems:   Obesity - BMI 79   Endometrial cancer   Lactic acidosis   Metabolic encephalopathy   Iron deficiency anemia   Acute respiratory failure with hypoxia   OSA (obstructive sleep apnea)   Cellulitis of right lower extremity   UTI (urinary tract infection)   Dehydration   History of recent kyphoplasty    Time spent: >35 minutes  Esperanza Sheets  [facility] Pager [phone]. If 7PM-7AM, please contact night-coverage at [email or web address], password [email or web address] 03/14/2013, 11:47 AM  LOS: 2 days

## 2013-03-14 NOTE — Progress Notes (Signed)
ANTIBIOTIC CONSULT NOTE - INITIAL  Pharmacy Consult:  Rocephin Indication:  UTI  No Known Allergies  Patient Measurements: Height: 5\' 11"  (180.3 cm) Weight: 424 lb 13.2 oz (192.7 kg) IBW/kg (Calculated) : 70.8  Vital Signs: Temp: 98 F (36.7 C) (11/22 1430) Temp src: Oral (11/22 1430) BP: 115/63 mmHg (11/22 1430) Pulse Rate: 98 (11/22 1430) Intake/Output from previous day: 11/21 0701 - 11/22 0700 In: 620 [P.O.:620] Out: -  Intake/Output from this shift: Total I/O In: 600 [P.O.:600] Out: 2 [Stool:2]  Labs:  Recent Labs  03/12/13 0845 03/12/13 1618 03/13/13 0428 03/14/13 1210  WBC 15.3* 14.5* 11.8*  --   HGB 9.4* 8.6* 7.9*  --   PLT 384 314 331  --   CREATININE 0.89 1.02 0.68 0.79   Estimated Creatinine Clearance: 167.7 ml/min (by C-G formula based on Cr of 0.79). No results found for this basename: VANCOTROUGH, VANCOPEAK, VANCORANDOM, GENTTROUGH, GENTPEAK, GENTRANDOM, TOBRATROUGH, TOBRAPEAK, TOBRARND, AMIKACINPEAK, AMIKACINTROU, AMIKACIN,  in the last 72 hours   Microbiology: Recent Results (from the past 720 hour(s))  CULTURE, BLOOD (ROUTINE X 2)     Status: None   Collection Time    03/12/13  9:20 AM      Result Value Range Status   Specimen Description BLOOD ARM LEFT   Final   Special Requests BOTTLES DRAWN AEROBIC ONLY 5CC   Final   Culture  Setup Time     Final   Value: 03/12/2013 13:29     Performed at [facility]   Culture     Final   Value:        BLOOD CULTURE RECEIVED NO GROWTH TO DATE CULTURE WILL BE HELD FOR 5 DAYS BEFORE ISSUING A FINAL NEGATIVE REPORT     Performed at [facility]   Report Status PENDING   Incomplete  CULTURE, BLOOD (ROUTINE X 2)     Status: None   Collection Time    03/12/13  9:30 AM      Result Value Range Status   Specimen Description BLOOD HAND LEFT   Final   Special Requests BOTTLES DRAWN AEROBIC ONLY 4CC   Final   Culture  Setup Time     Final   Value: 03/12/2013 13:29     Performed at [facility]   Culture     Final   Value:        BLOOD CULTURE RECEIVED NO GROWTH TO DATE CULTURE WILL BE HELD FOR 5 DAYS BEFORE ISSUING A FINAL NEGATIVE REPORT     Performed at [facility]   Report Status PENDING   Incomplete  URINE CULTURE     Status: None   Collection Time    03/12/13 11:57 AM      Result Value Range Status   Specimen Description URINE, CATHETERIZED   Final   Special Requests NONE   Final   Culture  Setup Time     Final   Value: 03/12/2013 18:10     Performed at [facility] Count     Final   Value: 60,000 COLONIES/ML     Performed at [facility]   Culture     Final   Value: ESCHERICHIA COLI     Performed at [facility]   Report Status 03/14/2013 FINAL   Final   Organism ID, Bacteria ESCHERICHIA COLI   Final  MRSA PCR SCREENING     Status: None   Collection Time  03/12/13  2:25 PM      Result Value Range Status   MRSA by PCR NEGATIVE  NEGATIVE Final   Comment:            The GeneXpert MRSA Assay (FDA     approved for NASAL specimens     only), is one component of a     comprehensive MRSA colonization     surveillance program. It is not     intended to diagnose MRSA     infection nor to guide or     monitor treatment for     MRSA infections.    Medical History: Past Medical History  Diagnosis Date  . Obesity   . DVT (deep venous thrombosis) 2009  . PONV (postoperative nausea and vomiting)   . Swelling     BOTH LEGS  . Rash     LOWER ABD  . Gallstones   . Ascites   . Uterine cancer   . Family history of anesthesia complication     MOTHER IS DIFFICULT TO WAKE  . Gallstones   . Cellulitis of leg, right       Assessment: 33 YOF with endometrial cancer currently on chemotherapy presented with AMS.  She was started on broad spectrum antibiotics for possible sepsis and RLE cellulitis, now to de-escalate to Rocephin monotherapy.  Patient also continues on fluconazole from PTA.  Vanc 11/20 >>  11/22 Zosyn 11/20 >> 11/22 Fluc PTA >> CXT 11/22 >>  11/20 BCx - NGTD 11/20 urine - 60K E.coli (sensitive to Ancef, gent, Macrobid, Zosyn, Septra)  Goal of Therapy:  Clearance of infection   Plan:  - Rocephin 2gm IV Q24H - Consider holding folic acid for folate level > 20 - Pharmacy will sign off as dosage adjustment unnecessary.  Thank you for the consult!    Garion Wempe D. Laney Potash, PharmD, BCPS Pager:  [phone] 03/14/2013, 2:51 PM

## 2013-03-14 NOTE — Evaluation (Signed)
Physical Therapy Evaluation Patient Details Name: [name] MRN: [id number] DOB: 09/08/1965 Today's Date: 03/14/2013 Time: 4098-1191 PT Time Calculation (min): 29 min  PT Assessment / Plan / Recommendation History of Present Illness   Pt is an morbidly obese 47 y.o. Female adm from home secondary to AMS and respiratory distress. Pt has been undergoing chemotherapy treatment secondary to endometrial cancer. Pt also found to have T10-T11 fx; pt awaiting treatment plan from MDs.   Clinical Impression  Pt adm secondary to above. Pt presents with limitations in functional mobility secondary to deficits listed below (See PT problem list). Pt has been relying heavily on elderly parents for mobility. Pt is required to amb to get over an arched bridge and into her house. Mother and pt report this has been becoming increasingly difficulty. Pt agreeable to D/C to SNF for post acute rehab after acute D/C to increase independence with gt, mobility and increase activity tolerance/strength.     PT Assessment  Patient needs continued PT services    Follow Up Recommendations  SNF;Supervision for mobility/OOB    Does the patient have the potential to tolerate intense rehabilitation      Barriers to Discharge Decreased caregiver support;Inaccessible home environment      Equipment Recommendations  Other (comment) (TBD)    Recommendations for Other Services OT consult   Frequency Min 3X/week    Precautions / Restrictions Precautions Precautions: Fall Restrictions Weight Bearing Restrictions: No   Pertinent Vitals/Pain 8/10 in back. patient repositioned for comfort       Mobility  Bed Mobility Bed Mobility: Supine to Sit;Sitting - Scoot to Edge of Bed;Sit to Supine Supine to Sit: 5: Supervision;HOB elevated;With rails Sitting - Scoot to Edge of Bed: 5: Supervision;With rail Sit to Supine: 1: +2 Total assist;HOB flat Sit to Supine: Patient Percentage: 10% Details for Bed Mobility  Assistance: pt required increased (A) to return to supine position; pt with increased difficulty lifting LEs into bed secondary to generalized weakness; pt became very anxious and tearful secondary to pain in low back when returning to supine  Transfers Transfers: Sit to Stand;Stand to Sit;Stand Pivot Transfers Sit to Stand: 1: +2 Total assist;From bed;With upper extremity assist;From chair/3-in-1 Sit to Stand: Patient Percentage: 60% Stand to Sit: 1: +2 Total assist;With upper extremity assist;To chair/3-in-1;To bed;To elevated surface;With armrests Stand to Sit: Patient Percentage: 60% Stand Pivot Transfers: 1: +2 Total assist;From elevated surface Stand Pivot Transfers: Patient Percentage: 70% Details for Transfer Assistance: pt very fearful of falling; cues for sequencing and hand placement; pt unsteady and requires 2+ to guide and steady her during transfers; pt became very fatigued transferring bed <> [facility] Ambulation/Gait Ambulation/Gait Assistance: Not tested (comment) Stairs: No Wheelchair Mobility Wheelchair Mobility: No         PT Diagnosis: Difficulty walking;Generalized weakness;Acute pain  PT Problem List: Decreased strength;Decreased range of motion;Decreased activity tolerance;Decreased balance;Decreased mobility;Decreased safety awareness;Pain;Obesity PT Treatment Interventions: DME instruction;Gait training;Therapeutic activities;Functional mobility training;Therapeutic exercise;Balance training;Neuromuscular re-education;Patient/family education     PT Goals(Current goals can be found in the care plan section) Acute Rehab PT Goals Patient Stated Goal: to not hurt my parents  PT Goal Formulation: With patient/family Time For Goal Achievement: 03/28/13 Potential to Achieve Goals: Good  Visit Information  Last PT Received On: 03/14/13 Assistance Needed: +2       Prior Functioning  Home Living Family/patient expects to be discharged to:: Private residence Living  Arrangements: Parent Available Help at Discharge: Family;Available 24 hours/day Type of Home: House Home  Access: Level entry Home Layout: One level Home Equipment: Walker - 4 wheels;Bedside commode;Shower seat Prior Function Level of Independence: Needs assistance Gait / Transfers Assistance Needed: pt holds onto parents to amb from car <> house; pt has to cross over bridge to get into house; amb with RW when she gets into house; requries multiple sitting rest breaks secondary to fatigue; pt can perform SPT independently from bed <> BSC  ADL's / Homemaking Assistance Needed: (A) for donning LE garmets and washing LEs  Communication Communication: No difficulties    Cognition  Cognition Arousal/Alertness: Awake/alert Behavior During Therapy: WFL for tasks assessed/performed Overall Cognitive Status: Within Functional Limits for tasks assessed    Extremity/Trunk Assessment Upper Extremity Assessment Upper Extremity Assessment: Defer to OT evaluation Lower Extremity Assessment Lower Extremity Assessment: Generalized weakness Cervical / Trunk Assessment Cervical / Trunk Assessment: Kyphotic   Balance Balance Balance Assessed: Yes Static Sitting Balance Static Sitting - Balance Support: Bilateral upper extremity supported;Feet unsupported Static Sitting - Level of Assistance: 5: Stand by assistance  End of Session PT - End of Session Activity Tolerance: Patient limited by fatigue;Patient limited by pain Patient left: in bed;with call bell/phone within reach;with family/visitor present Nurse Communication: Mobility status;Patient requests pain meds  GP     Donell Sievert, [place] [phone] 03/14/2013, 1:13 PM

## 2013-03-15 ENCOUNTER — Inpatient Hospital Stay (HOSPITAL_COMMUNITY): Payer: BC Managed Care – PPO

## 2013-03-15 LAB — BASIC METABOLIC PANEL
BUN: 14 mg/dL (ref 6–23)
Creatinine, Ser: 0.61 mg/dL (ref 0.50–1.10)
GFR calc Af Amer: >90 mL/min (ref >90)
GFR calc non Af Amer: >90 mL/min (ref >90)
Glucose, Bld: 103 mg/dL — ABNORMAL HIGH (ref 70–99)
Potassium: 3.4 mEq/L — ABNORMAL LOW (ref 3.5–5.1)

## 2013-03-15 LAB — CBC
HCT: 25.2 % — ABNORMAL LOW (ref 36.0–46.0)
Hemoglobin: 7.9 g/dL — ABNORMAL LOW (ref 12.0–15.0)
MCH: 35.3 pg — ABNORMAL HIGH (ref 26.0–34.0)
MCHC: 31.3 g/dL (ref 30.0–36.0)
RBC: 2.24 MIL/uL — ABNORMAL LOW (ref 3.87–5.11)
RDW: 21.8 % — ABNORMAL HIGH (ref 11.5–15.5)
WBC: 7.6 10*3/uL (ref 4.0–10.5)

## 2013-03-15 MED ORDER — GUAIFENESIN 100 MG/5ML PO SYRP
200.0000 mg | ORAL_SOLUTION | ORAL | Status: DC | PRN
  Administered 2013-03-15 (×2): 200 mg via ORAL
  Filled 2013-03-15 (×2): qty 10

## 2013-03-15 MED ORDER — ALTEPLASE 2 MG IJ SOLR
2.0000 mg | Freq: Once | INTRAMUSCULAR | Status: AC
  Administered 2013-03-15: 2 mg
  Filled 2013-03-15: qty 2

## 2013-03-15 NOTE — Progress Notes (Signed)
Unable to do the MRI at this facility, will do as outpt.  Dr. Informed.

## 2013-03-15 NOTE — Progress Notes (Signed)
Physical Therapy Treatment Patient Details Name: [name] MRN: [id number] DOB: [birth year] Today's Date: 03/15/2013 Time: 4098-1191 PT Time Calculation (min): 30 min  PT Assessment / Plan / Recommendation  History of Present Illness With a history of endometrial cancer currently on chemotherapy, obesity that presents to the emergency room with altered mental status.   PT Comments   Pain and fatigue limiting amb today, but noted good rise from seated surface; Pt with dyspnea with exertion -- worth considering a specialty wheelchair (possibly power chair) for when she goes home; Pt states she has been in talks with her insurance and wheelchair company about getting one for quite some time   Follow Up Recommendations  SNF;Supervision for mobility/OOB     Does the patient have the potential to tolerate intense rehabilitation     Barriers to Discharge        Equipment Recommendations  Other (comment);Wheelchair (measurements PT);Wheelchair cushion (measurements PT)    Recommendations for Other Services OT consult  Frequency Min 3X/week   Progress towards PT Goals Progress towards PT goals: Progressing toward goals (quite slowly)  Plan Current plan remains appropriate    Precautions / Restrictions Precautions Precautions: Fall Restrictions Weight Bearing Restrictions: No   Pertinent Vitals/Pain Did not specifically rate; but pain severely limting activity tolerance     Mobility  Bed Mobility Bed Mobility: Supine to Sit;Sitting - Scoot to Edge of Bed;Sit to Supine Supine to Sit: 5: Supervision;HOB elevated;With rails Sitting - Scoot to Edge of Bed: 5: Supervision;With rail Sit to Supine: 1: +2 Total assist;HOB flat Sit to Supine: Patient Percentage: 10% Details for Bed Mobility Assistance: pt required increased (A) to return to supine position; pt with increased difficulty lifting LEs into bed secondary to generalized weakness Transfers Transfers: Sit to Stand;Stand to  Sit Sit to Stand: From bed;With upper extremity assist;From chair/3-in-1;4: Min assist Stand to Sit: With upper extremity assist;To chair/3-in-1;To bed;To elevated surface;With armrests;4: Min assist Details for Transfer Assistance: pt very fearful of falling; cues for sequencing and hand placement; pt unsteady and requires 2+ to guide and steady her during transfers; pt became very fatigued transferring bed <> BSC Ambulation/Gait Ambulation/Gait Assistance: 1: +2 Total assist Ambulation/Gait: Patient Percentage: 70% Ambulation Distance (Feet): 4 Feet (x2) Assistive device: Rolling walker Ambulation/Gait Assistance Details: Adjusted RW for optimal fit to push down into RW and hopefully take pressure off of back, making amb more tolerable; +2 for safety with amb; pt extremely fatigued and painful with standing activity; DOE 3/4 with that limited amb distnace Stairs: No Wheelchair Mobility Wheelchair Mobility: No    Exercises     PT Diagnosis:    PT Problem List:   PT Treatment Interventions:     PT Goals (current goals can now be found in the care plan section) Acute Rehab PT Goals Patient Stated Goal: to not hurt my parents  PT Goal Formulation: With patient/family Time For Goal Achievement: 03/28/13 Potential to Achieve Goals: Good  Visit Information  Last PT Received On: 03/15/13 Assistance Needed: +2 (For safety, and especially for supine to sit) History of Present Illness: With a history of endometrial cancer currently on chemotherapy, obesity that presents to the emergency room with altered mental status.    Subjective Data  Subjective: Wants to try to walk Patient Stated Goal: to not hurt my parents    Cognition  Cognition Arousal/Alertness: Awake/alert Behavior During Therapy: WFL for tasks assessed/performed Overall Cognitive Status: Within Functional Limits for tasks assessed    Balance  Balance Balance Assessed: Yes Static Sitting Balance Static Sitting -  Balance Support: Bilateral upper extremity supported;Feet unsupported Static Sitting - Level of Assistance: 5: Stand by assistance  End of Session PT - End of Session Equipment Utilized During Treatment: Gait belt (sheet roll) Activity Tolerance: Patient limited by fatigue;Patient limited by pain Patient left: in bed;with call bell/phone within reach Nurse Communication: Mobility status   GP     Van Clines [facility] [place], [place] [phone]  03/15/2013, 5:25 PM

## 2013-03-15 NOTE — Progress Notes (Signed)
[facility] PROGRESS NOTE  [name]:[id number] DOB: Feb 10, 1966 DOA: 03/12/2013 PCP: [name], MD  Brief summary  47 y.o. female with a history of endometrial cancer currently on chemotherapy presented to the emergency room with altered mental status. She is accompanied by her mother as well as on. Information details history and physical obtained from mother. The mother woke up this morning and stated that she found her daughter, sleeping and was unable to arouse her. She was due to have her chemotherapy treatment today. Her last chemotherapy treatment was 3 weeks ago. Her mother states that she is having a hard time breathing at which point EMS was called and patient was brought to the emergency department. She was placed on BiPAP. In route EMS the patient is Narcan. While in the emergency department she was also given a second dose of Narcan. Of note pt is morbidly obese and has known OSA. By the time the admitting MD evaluated the pt she was stable on 2L.  Of note the pt has had s/s consistent with cellulitis of RLE and was on anbx's pre admit. Due to LE edema and OP venous duplex had been done in past week but this was reported as negative.  In the ER telemetry was concerning for possible AF noting VRs 150-160. Cardiology consulted and reviewed strips/EKG and felt was sinus tachycardia.   Assessment/Plan:   1. Lactic acidosis/Dehydration; resolved; s/p lasix x 2; replace lytes;   2. Metabolic encephalopathy; resolved and likely due to underlying infection in setting of OSA and regular use of narcotics - resolved   3. Acute respiratory failure with hypoxia/OSA (obstructive sleep apnea) likely due to narcotic overdose; ? If uses CPAP at HS at home; no s/s oof VTE; ECHO without abnormalities or evidence of acute RH strain so low index suspicion initial sx's due to PE; [place] neg for DVT;  -Pulmonary medicine following-recommends OP follow up with PSG/sleep study  -fluid  overload; give a dose of lasix; replete lytes; f/u BMP in AM   4. Anemia, likely cho induced; as well Iron deficiency anemia on oral iron at home; Hg stable; no s/s of acute bleeding  -Fe < 10; replete via IV 500 mg daily x 2 days starting tomorrow; no s/s of bleeding   5. Cellulitis of right lower extremity; [place] no abscess;  -improving; cont atx  6. E. Coli UTI (urinary tract infection); >60,000 colonies -cont empiric anbx's   7. Obesity - BMI 79  -Bari bed-mobilize   8. 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 History of recent kyphoplasty  -family requests Dr. Titus Dubin see pt- FU appt due 11/21;  -obtain MRI L.T spine; d/w radiology if possible at [facility]; patient is overweight    Code Status: DNR Family Communication: mother at the bedside (indicate person spoken with, relationship, and if by phone, the number) Disposition Plan: SNF   Consultants:  PCCM,cardiology, oncology  Procedures:  [place]  Antibiotics:  Zosyn/vanc 11/20<<<<   (indicate start date, and stop date if known)  HPI/Subjective: alert  Objective: Filed Vitals:   03/15/13 0640  BP: 97/53  Pulse: 101  Temp: 98.1 F (36.7 C)  Resp: 22    Intake/Output Summary (Last 24 hours) at 03/15/13 0956 Last data filed at 03/14/13 1845  Gross per 24 hour  Intake  1450 ml  Output    901 ml  Net    549 ml   Filed Weights   03/12/13 0825 03/12/13 1345 03/13/13 1217  Weight: 197.315 kg (435 lb) 192.7 kg (424 lb 13.2 oz) 192.7 kg (424 lb 13.2 oz)    Exam:   General:  alert  Cardiovascular: s1,s2 rrr  Respiratory: CTA BL  Abdomen: soft, obese, nt  Musculoskeletal: no edema    Data Reviewed: Basic Metabolic Panel:  Recent Labs Lab 03/10/13 1116 03/12/13 0845 03/12/13 1618  03/13/13 0428 03/14/13 1210 03/15/13 0715  NA 139 138  --  137 139 140  K 3.1* 3.9  --  3.4* 3.3* 3.4*  CL  --  96  --  99 98 102  CO2 23 21  --  27 27 27   GLUCOSE 172* 207*  --  154* 152* 103*  BUN 10.9 18  --  20 14 14   CREATININE 0.7 0.89 1.02 0.68 0.79 0.61  CALCIUM 9.1 8.9  --  8.0* 8.5 8.2*  MG  --   --  2.1  --   --   --   PHOS  --   --  4.0  --   --   --    Liver Function Tests:  Recent Labs Lab 03/10/13 1116 03/12/13 0845  AST 17 42*  ALT 43 53*  ALKPHOS 155* 179*  BILITOT 0.61 0.5  PROT 6.0* 6.3  ALBUMIN 2.7* 2.6*   No results found for this basename: LIPASE, AMYLASE,  in the last 168 hours No results found for this basename: AMMONIA,  in the last 168 hours CBC:  Recent Labs Lab 03/10/13 1116 03/12/13 0845 03/12/13 1618 03/13/13 0428 03/15/13 0715  WBC 9.0 15.3* 14.5* 11.8* 7.6  NEUTROABS 6.5  --   --   --   --   HGB 9.4* 9.4* 8.6* 7.9* 7.9*  HCT 28.0* 30.1* 25.8* 24.5* 25.2*  MCV 108.1* 113.6* 107.9* 109.4* 112.5*  PLT 259 384 314 331 346   Cardiac Enzymes:  Recent Labs Lab 03/12/13 0845  TROPONINI <0.30   BNP (last 3 results)  Recent Labs  03/12/13 0845  PROBNP 2322.0*   CBG:  Recent Labs Lab 03/12/13 0829  GLUCAP 210*    Recent Results (from the past 240 hour(s))  CULTURE, BLOOD (ROUTINE X 2)     Status: None   Collection Time    03/12/13  9:20 AM      Result Value Range Status   Specimen Description BLOOD ARM LEFT   Final   Special Requests BOTTLES DRAWN AEROBIC ONLY 5CC   Final   Culture  Setup Time     Final   Value: 03/12/2013 13:29     Performed at [facility]   Culture     Final   Value:        BLOOD CULTURE RECEIVED NO GROWTH TO DATE CULTURE WILL BE HELD FOR 5 DAYS BEFORE ISSUING A FINAL NEGATIVE REPORT     Performed at [facility]   Report Status PENDING   Incomplete  CULTURE, BLOOD (ROUTINE X 2)     Status: None   Collection Time    03/12/13  9:30 AM      Result Value Range Status   Specimen  Description BLOOD HAND LEFT   Final   Special Requests BOTTLES DRAWN AEROBIC ONLY 4CC   Final   Culture  Setup Time     Final   Value: 03/12/2013 13:29  Performed at [facility]     Final   Value:        BLOOD CULTURE RECEIVED NO GROWTH TO DATE CULTURE WILL BE HELD FOR 5 DAYS BEFORE ISSUING A FINAL NEGATIVE REPORT     Performed at [facility]   Report Status PENDING   Incomplete  URINE CULTURE     Status: None   Collection Time    03/12/13 11:57 AM      Result Value Range Status   Specimen Description URINE, CATHETERIZED   Final   Special Requests NONE   Final   Culture  Setup Time     Final   Value: 03/12/2013 18:10     Performed at [facility] Count     Final   Value: 60,000 COLONIES/ML     Performed at [facility]   Culture     Final   Value: ESCHERICHIA COLI     Performed at [facility]   Report Status 03/14/2013 FINAL   Final   Organism ID, Bacteria ESCHERICHIA COLI   Final  MRSA PCR SCREENING     Status: None   Collection Time    03/12/13  2:25 PM      Result Value Range Status   MRSA by PCR NEGATIVE  NEGATIVE Final   Comment:            The GeneXpert MRSA Assay (FDA     approved for NASAL specimens     only), is one component of a     comprehensive MRSA colonization     surveillance program. It is not     intended to diagnose MRSA     infection nor to guide or     monitor treatment for     MRSA infections.     Studies: [place] Extrem Low Right Comp  03/14/2013   CLINICAL DATA:  Clinical concern of a palpable mass right leg  EXAM: RIGHT LOWER EXTREMITY SOFT TISSUE ULTRASOUND COMPLETE  TECHNIQUE: Ultrasound examination was performed including evaluation of the muscles, tendons, joint, and adjacent soft tissues.  COMPARISON:  None.  FINDINGS: No focal solid or cystic sonographic abnormalities identified. There is no evidence of free fluid or loculated fluid collections. Heterogeneity of the subcutaneous  tissues is appreciated.  IMPRESSION: 1. Findings which may reflect sequela of cellulitis. 2. No focal abnormalities identified   Electronically Signed   By: Salome Holmes M.D.   On: 03/14/2013 11:21    Scheduled Meds: . cefTRIAXone (ROCEPHIN)  IV  2 g Intravenous Q24H  . dextromethorphan-guaiFENesin  1 tablet Oral BID  . enoxaparin (LOVENOX) injection  0.5 mg/kg Subcutaneous Q24H  . [START ON 03/16/2013] ferrous sulfate  325 mg Oral TID WC  . fluconazole  100 mg Oral Daily  . folic acid  1 mg Oral Daily  . iron dextran (INFED/DEXFERRUM) infusion  500 mg Intravenous Daily  . naproxen  375 mg Oral BID WC  . pantoprazole  40 mg Oral Daily  . potassium chloride  40 mEq Oral BID  . sodium chloride  3 mL Intravenous Q12H  . sucralfate  1 g Oral TID AC & HS   Continuous Infusions:    Active Problems:   Obesity - BMI 79   Endometrial cancer   Lactic acidosis   Metabolic encephalopathy   Iron deficiency anemia   Acute respiratory failure with hypoxia   OSA (obstructive sleep apnea)   Cellulitis of  right lower extremity   UTI (urinary tract infection)   Dehydration   History of recent kyphoplasty    Time spent: >35 minutes     Esperanza Sheets  [facility] Pager [phone]. If 7PM-7AM, please contact night-coverage at [email or web address], password [facility] 03/15/2013, 9:56 AM  LOS: 3 days

## 2013-03-15 NOTE — Progress Notes (Signed)
Pt at 423.94 lbs exceeds 350lb table limit. This was relayed to MD who was standing beside RNn we were speaking w. I had also spoke to MD about alternate options in lieu of an open MRI ; [facility] as an OP. Please call [phone])with any additional questions or concerns -Eustaquio Boyden, RTRMR

## 2013-03-16 ENCOUNTER — Telehealth: Payer: Self-pay | Admitting: *Deleted

## 2013-03-16 DIAGNOSIS — R188 Other ascites: Secondary | ICD-10-CM

## 2013-03-16 LAB — BASIC METABOLIC PANEL
BUN: 14 mg/dL (ref 6–23)
Calcium: 8.6 mg/dL (ref 8.4–10.5)
GFR calc Af Amer: >90 mL/min (ref >90)
GFR calc non Af Amer: >90 mL/min (ref >90)
Glucose, Bld: 76 mg/dL (ref 70–99)
Sodium: 141 mEq/L (ref 135–145)

## 2013-03-16 MED ORDER — FUROSEMIDE 20 MG PO TABS
10.0000 mg | ORAL_TABLET | Freq: Once | ORAL | Status: AC
  Administered 2013-03-16: 10 mg via ORAL
  Filled 2013-03-16: qty 1

## 2013-03-16 MED ORDER — AMOXICILLIN-POT CLAVULANATE 875-125 MG PO TABS: 1.0000 | ORAL_TABLET | Freq: Two times a day (BID) | ORAL | Status: DC

## 2013-03-16 MED ORDER — HEPARIN SOD (PORK) LOCK FLUSH 100 UNIT/ML IV SOLN
500.0000 [IU] | INTRAVENOUS | Status: AC | PRN
  Administered 2013-03-16: 500 [IU]

## 2013-03-16 MED ORDER — FUROSEMIDE 20 MG PO TABS: 20.0000 mg | ORAL_TABLET | ORAL | Status: DC

## 2013-03-16 NOTE — Telephone Encounter (Signed)
Pt's mom Darci called to cancel pt appt on 11/25. Pt discharged from Hospital today, cannot walk and will not be able to come to appt 11/25. POF sent. Provider notified.

## 2013-03-16 NOTE — Progress Notes (Signed)
Discharge instructions reviewed with patient and patient's mother, caregiver. Reviewed new prescriptions and f/u appointments with [facility]. Patient verbalizes understanding.

## 2013-03-16 NOTE — Care Management Note (Signed)
  Page 1 of 1   03/16/2013     12:10:13 PM   CARE MANAGEMENT NOTE 03/16/2013  Patient:  [name], [name]   Account Number:  [id number]  Date Initiated:  03/16/2013  Documentation initiated by:  Ronny Flurry  Subjective/Objective Assessment:     Action/Plan:   Anticipated DC Date:  03/16/2013   Anticipated DC Plan:  HOME W HOME HEALTH SERVICES         Choice offered to / List presented to:  C-1 Patient   DME arranged  WHEELCHAIR - MANUAL      DME agency  [facility]     [facility] arranged  HH-1 RN  HH-2 PT  HH-4 NURSE'S AIDE      HH agency  [facility]   Status of service:   Medicare Important Message given?   (If response is "NO", the following Medicare IM given date fields will be blank) Date Medicare IM given:   Date Additional Medicare IM given:    Discharge Disposition:    Per UR Regulation:  Reviewed for med. necessity/level of care/duration of stay  If discussed at Long Length of Stay Meetings, dates discussed:    Comments:  03-16-13 Patient's cell phone number [phone] , patient's mother's cell phone is [phone] , address is [address] , [place] .

## 2013-03-16 NOTE — Progress Notes (Signed)
Patient discharged to home via EMS. Patient's mother to transport wheelchair.

## 2013-03-16 NOTE — Clinical Social Work Psychosocial (Signed)
Clinical Social Work Department BRIEF PSYCHOSOCIAL ASSESSMENT 03/16/2013  Patient:  [name], [name]     Account Number:  [id number]     Admit date:  03/12/2013  Clinical Social Worker:  Sherre Lain  Date/Time:  03/16/2013 11:14 AM  Referred by:  Physician  Date Referred:  03/16/2013 Referred for  SNF Placement   Other Referral:   none.   Interview type:  Patient Other interview type:   Pt's mother was present at bedside.    PSYCHOSOCIAL DATA Living Status:  FAMILY Admitted from facility:   Level of care:   Primary support name:  [name] Primary support relationship to patient:  PARENT Degree of support available:   Strong support system. Pt lives with her mother and father.    CURRENT CONCERNS Current Concerns  Post-Acute Placement   Other Concerns:   none.    SOCIAL WORK ASSESSMENT / PLAN CSW met with pt at bedside. CSW explained CSW role with [facility]. Pt stated that she was agreeable to speaking with CSW. CSW informed pt of CSW in SNF placement process. Pt stated that she was agreeable to SNF placement search, but would rather be discharged home and receive "therapy in the home," if possible. CSW stated that CSW would consult RNCM regarding the pt's request. Pt informed CSW that prior to admission to [facility], pt was living at home with her parents and brother. Pt's mother stated that herself and pt's father are available to provide 24/supervision/support to the pt once she is medically discharged. CSW to continue to assist with discharge planning due to pt's request to see "all options available" in terms of discharge disposition. CSW followed-up with [facility] and MD regarding information above.   Assessment/plan status:  Psychosocial Support/Ongoing Assessment of Needs Other assessment/ plan:   none.   Information/referral to community resources:   [facility] placement options.    PATIENTS/FAMILYS RESPONSE TO PLAN OF CARE: Pt and pt's mother were  understanding and agreeable to CSW plan of care.     [name], LCSWA Clinical Social Worker [phone]

## 2013-03-16 NOTE — Discharge Summary (Addendum)
Physician Discharge Summary  TORUNN CHANCELLOR WUJ:[id number] DOB: 1965-06-30 DOA: 03/12/2013  PCP: [name], MD  Admit date: 03/12/2013 Discharge date: 03/16/2013  Time spent: >35 minutes minutes  Recommendations for Outpatient Follow-up:  F/u with Dr. Welton Flakes oncology in 1-2 weeks to reevaluate for chemotherapy  [facility]  F/u with Dr. Titus Dubin in 1 week  Discharge Diagnoses:  Active Problems:   Obesity - BMI 79   Endometrial cancer   Lactic acidosis   Metabolic encephalopathy   Iron deficiency anemia   Acute respiratory failure with hypoxia   OSA (obstructive sleep apnea)   Cellulitis of right lower extremity   UTI (urinary tract infection)   Dehydration   History of recent kyphoplasty   Discharge Condition: stable   Diet recommendation: heart healthy   Filed Weights   03/12/13 1345 03/13/13 1217 03/16/13 0500  Weight: 192.7 kg (424 lb 13.2 oz) 192.7 kg (424 lb 13.2 oz) 194.6 kg (429 lb 0.2 oz)    History of present illness:  Brief summary  47 y.o. female with a history of endometrial cancer currently on chemotherapy presented to the emergency room with altered mental status. She is accompanied by her mother as well as on. Information details history and physical obtained from mother. The mother woke up this morning and stated that she found her daughter, sleeping and was unable to arouse her. She was due to have her chemotherapy treatment today. Her last chemotherapy treatment was 3 weeks ago. Her mother states that she is having a hard time breathing at which point EMS was called and patient was brought to the emergency department. She was placed on BiPAP. In route EMS the patient is Narcan. While in the emergency department she was also given a second dose of Narcan. Of note pt is morbidly obese and has known OSA. By the time the admitting MD evaluated the pt she was stable on 2L.  Of note the pt has had s/s consistent with cellulitis of RLE and was  on anbx's pre admit. Due to LE edema and OP venous duplex had been done in past week but this was reported as negative.   Hospital Course:  1. Lactic acidosis/Dehydration; resolved on supportive care IVF; patient is tolerating diet well  2. Metabolic encephalopathy; resolved and likely due to underlying infection in setting of OSA and regular use of narcotics  - resolved  3. Acute respiratory failure with hypoxia/OSA (obstructive sleep apnea) likely due to narcotic overdose; ? If uses CPAP at HS at home; no s/s oof VTE; ECHO without abnormalities or evidence of acute RH strain so low index suspicion initial sx's due to PE; but [place] neg for DVT;  -Pulmonary medicine following-recommends OP follow up with PSG/sleep study  -fluid overload; started low dose lasix Q48 hours;  4. Anemia, likely chemo induced; as well Iron deficiency anemia on oral iron at home; Hg stable; no s/s of acute bleeding  -Fe < 10; replete via IV 500 mg daily ; cont PO iron  5. Cellulitis of right lower extremity; [place] no abscess;  -resolving; cont atx PO 6. E. Coli UTI (urinary tract infection); >60,000 colonies  completed anbx's; afebrile;  7. Obesity - BMI 79  -Bari bed-mobilize  8. 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 History of recent kyphoplasty by Dr. Titus Dubin  -called to obtain MRI L.T spine; d/w radiology patient is overweight will not be able to perform MRI; [facility] at [facility];  no acute neurological signs on exam  -d/w patient, and her mother; they will f/u with [facility] at [facility]. And ortho, or neurosurgery f/u outpatient as needed    Patient is DNR    Patient was being d/c to snf with outpatient f/u as  above but then later she preferred to go home [facility]  Procedures:  BiPAP (i.e. Studies not automatically included, echos, thoracentesis, etc; not x-rays)  Consultations:  Pulmonology, hem/onc, IR, critical care   Discharge Exam: Filed Vitals:   03/16/13 0500  BP: 101/64  Pulse: 97  Temp: 98.5 F (36.9 C)  Resp: 22    General: alert Cardiovascular: s1,s2 rrr Respiratory: CTA BL  Discharge Instructions  Discharge Orders   Future Appointments Provider Department Dept Phone   03/17/2013 10:15 AM Beverely Pace [facility] [place] [facility] [phone]   03/17/2013 10:45 AM Illa Level, NP [place] [facility] [phone]   03/31/2013 11:15 AM Mauri [facility] [phone]   03/31/2013 11:45 AM Illa Level, NP [place] [facility] [phone]   03/31/2013 12:30 PM [facility] Procedure 2 [place] [facility] [phone]   04/01/2013 11:15 AM [facility] Inj Nurse [place] [facility] [phone]   04/07/2013 11:15 AM Krista Blue [facility] [phone]   04/07/2013 11:45 AM Illa Level, NP [place] [facility] [phone]   04/09/2013 1:15 PM Laurette Schimke, MD [place] [facility] [phone]   Future Orders Complete By Expires   Diet - low sodium heart healthy  As directed    Discharge instructions  As directed    Comments:     Please follow up with oncology outpatient to reevaluate for chemotherapy  Please follow up [facility]  Please follow up with PCP in 1 week   Increase activity slowly  As directed        Medication List    STOP taking these medications       ALEVE 220 MG tablet  Generic drug:  naproxen sodium     CARBOPLATIN IV     clindamycin 300 MG capsule  Commonly known as:   CLEOCIN     dexamethasone 4 MG tablet  Commonly known as:  DECADRON     LORazepam 0.5 MG tablet  Commonly known as:  ATIVAN     triamterene-hydrochlorothiazide 37.5-25 MG per tablet  Commonly known as:  MAXZIDE-25      TAKE these medications       amoxicillin-clavulanate 875-125 MG per tablet  Commonly known as:  AUGMENTIN  Take 1 tablet by mouth 2 (two) times daily.     cyclobenzaprine 5 MG tablet  Commonly known as:  FLEXERIL  Take 5 mg by mouth 3 (three) times daily as needed for muscle spasms.     dextromethorphan-guaiFENesin 30-600 MG per 12 hr tablet  Commonly known as:  MUCINEX DM  Take 1 tablet by mouth 2 (two) times daily.     ferrous sulfate 325 (65 FE) MG tablet  Take 325 mg by mouth 3 (three) times daily with meals.     fluconazole 100 MG tablet  Commonly known as:  DIFLUCAN  Take 100 mg by mouth daily.     folic acid 400 MCG tablet  Commonly known as:  FOLVITE  Take 400 mcg by mouth daily.     furosemide 20 MG tablet  Commonly known as:  LASIX  Take 1 tablet (20 mg total) by mouth every other day.     lidocaine-prilocaine cream  Commonly known as:  EMLA  Apply 1 application topically as needed. Apply to port-a-cath site 1-2 hours prior to treatment.     loratadine 10 MG tablet  Commonly known as:  CLARITIN  Take 10 mg by mouth daily.     magnesium gluconate 500 MG tablet  Commonly known as:  MAGONATE  Take 500 mg by mouth daily.     multivitamin with minerals tablet  Take 1 tablet by mouth daily.     NEULASTA 6 MG/0.6ML injection  Generic drug:  pegfilgrastim  Inject 6 mg into the skin every 21 ( twenty-one) days.     nystatin ointment  Commonly known as:  MYCOSTATIN  Apply 1 application topically 2 (two) times daily.     ondansetron 8 MG tablet  Commonly known as:  ZOFRAN  Take 8 mg by mouth 2 (two) times daily. Take two times a day starting the day after chemo for 3 days. Then take two times a day as needed for nausea or vomiting.      oxyCODONE-acetaminophen 10-325 MG per tablet  Commonly known as:  PERCOCET  Take 1 tablet by mouth every 4 (four) hours as needed for pain.     pantoprazole 40 MG tablet  Commonly known as:  PROTONIX  Take 40 mg by mouth daily.     potassium chloride 10 MEQ tablet  Commonly known as:  K-DUR  Take 2 tablets (20 mEq total) by mouth 2 (two) times daily.     PRESCRIPTION MEDICATION  every 21 ( twenty-one) days. CHEMOTHERAPY REGIMEN     prochlorperazine 10 MG tablet  Commonly known as:  COMPAZINE  Take 10 mg by mouth every 6 (six) hours as needed (Nausea or vomiting).     promethazine 25 MG tablet  Commonly known as:  PHENERGAN  Take 12.5 mg by mouth every 6 (six) hours as needed for nausea or vomiting.     sucralfate 1 GM/10ML suspension  Commonly known as:  CARAFATE  Take 1 g by mouth 4 (four) times daily.     TAXOL IV  Inject 1 each into the vein every 21 ( twenty-one) days.     UNABLE TO FIND  - Heavy duty rolling walker  -   - Diagnosis: Endometrial cancer 182.0; Ascites 789.59; severe fatigue 780.79     UNABLE TO FIND  - Super heavy duty wheelchair with anit-tippers and leg rests with heel loops  -   - Diagnosis: Endometrial cancer 182.0; Ascites 789.59; severe fatigue 780.79, thoracic compression fractures 733.13     VITAMIN B-12 PO  Take 2,500 mg by mouth daily.     vitamin C 1000 MG tablet  Take 1,000 mg by mouth daily.       No Known Allergies     Follow-up Information   Follow up with Drue Second, MD In 1 week.   Specialty:  Oncology   Contact information:   [address] [place] [address] [phone]       Follow up with [name], MD In 1 week.   Specialty:  Family Medicine   Contact information:   [phone]  [facility] [address] [place] [address] [phone]        The results of significant diagnostics from this hospitalization (including imaging, microbiology, ancillary and laboratory) are listed below for reference.     Significant Diagnostic Studies: Ct Head Wo Contrast  03/12/2013   CLINICAL DATA:  Altered mental status.  EXAM: CT HEAD WITHOUT CONTRAST  TECHNIQUE: Contiguous axial images were obtained from the base of the skull through the vertex without contrast.  COMPARISON:  None.  FINDINGS: No evidence for acute infarction, hemorrhage, mass lesion, hydrocephalus, or extra-axial fluid. Premature for age cerebral atrophy. Chronic microvascular ischemic change is noted in the periventricular and subcortical white matter. No osseous lesions. Right maxillary and right ethmoid sinus disease. Grossly negative orbits. Nonspecific right mastoid fluid. Increased body habitus. Intubated.  IMPRESSION: Premature for age cerebral atrophy.  No acute intracranial findings.   Electronically Signed   By: Davonna Belling M.D.   On: 03/12/2013 10:07   [place] Extrem Low Right Comp  03/14/2013   CLINICAL DATA:  Clinical concern of a palpable mass right leg  EXAM: RIGHT LOWER EXTREMITY SOFT TISSUE ULTRASOUND COMPLETE  TECHNIQUE: Ultrasound examination was performed including evaluation of the muscles, tendons, joint, and adjacent soft tissues.  COMPARISON:  None.  FINDINGS: No focal solid or cystic sonographic abnormalities identified. There is no evidence of free fluid or loculated fluid collections. Heterogeneity of the subcutaneous tissues is appreciated.  IMPRESSION: 1. Findings which may reflect sequela of cellulitis. 2. No focal abnormalities identified   Electronically Signed   By: Salome Holmes M.D.   On: 03/14/2013 11:21   Dg Chest Port 1 View  03/12/2013   CLINICAL DATA:  Shortness of breath, respiratory distress  EXAM: PORTABLE CHEST - 1 VIEW  COMPARISON:  02/02/2013.  FINDINGS: The chest is rotated towards the right. There is bilateral diffuse interstitial thickening likely secondary to low lung volumes and technique. There is no pleural effusion or focal consolidation.  The cardiac silhouette is mildly enlarged. There is a  right-sided Port-A-Cath projecting over the SVC.  IMPRESSION: Accounting for the differences in technique, there is no significant interval change. No acute cardiopulmonary disease. .   Electronically Signed   By: Elige Ko   On: 03/12/2013 08:58   Ir Vertebroplasty Or Sacroplasty  03/02/2013   CLINICAL DATA:  Patient with severely painful compression fracture at T10 and T11. History of endometrial carcinoma. On chemotherapy.  EXAM: VERTEBROPLASTY FL AT T10 and T11.  CORE BIOPSIES OBTAINED  MEDICATIONS: Versed 4 mg. mg IV, Fentanyl 100  mcg IV.  ANESTHESIA/SEDATION: Total Moderate Sedation Time:  35 min.  FLUOROSCOPY TIME:  8 min 48 seconds. Marland Kitchen  PROCEDURE: Following a full explanation of the procedure along with the potentially associated complications, a witnessed informed consent was obtained.  The patient was placed prone on the fluoroscopic table. Nasal oxygen was administered. Physiologic monitoring was performed throughout the duration of the procedure. The skin overlying the thoracic region was prepped and draped in the usual sterile fashion. The T10 and T11 vertebral bodies were identified and the right pedicle at T10, and the left pedicle at T11 were infiltrated with 0.25% Bupivacaine. This was then followed by the advancement of a 13-gauge Cook needle through both the pedicles into the anterior one-third at both levels. Prior to this, 18 gauge core biopsy needles were used to obtain 4 samples of both the levels using a 20 mL syringe. These were sent for pathologic analysis. A gentle contrast injection  demonstrated a trabecular pattern of contrast.  At this time, methylmethacrylate mixture was reconstituted. Under biplane intermittent fluoroscopy, the methylmethacrylate was then injected into the T10 and T11 vertebral body with filling of the vertebral body at T10 and T12.  Mild extravasation was noted into the disk spaces through the inferior endplate fracture clefts . No epidural venous  contamination was seen.  The needles were then removed. Hemostasis was achieved at the skin entry site.  There were no acute complications. Patient tolerated the procedure well. The patient was observed for 3 hours and discharged in good condition.  IMPRESSION: Status post vertebral body augmentation for painful compression fracture at T10 and T11 using vertebroplasty technique.  Core biopsies obtained at T10 and T11. Sample sent for pathologic analysis.   Electronically Signed   By: Julieanne Cotton M.D.   On: 02/26/2013 13:10    Microbiology: Recent Results (from the past 240 hour(s))  CULTURE, BLOOD (ROUTINE X 2)     Status: None   Collection Time    03/12/13  9:20 AM      Result Value Range Status   Specimen Description BLOOD ARM LEFT   Final   Special Requests BOTTLES DRAWN AEROBIC ONLY 5CC   Final   Culture  Setup Time     Final   Value: 03/12/2013 13:29     Performed at [facility]   Culture     Final   Value:        BLOOD CULTURE RECEIVED NO GROWTH TO DATE CULTURE WILL BE HELD FOR 5 DAYS BEFORE ISSUING A FINAL NEGATIVE REPORT     Performed at [facility]   Report Status PENDING   Incomplete  CULTURE, BLOOD (ROUTINE X 2)     Status: None   Collection Time    03/12/13  9:30 AM      Result Value Range Status   Specimen Description BLOOD HAND LEFT   Final   Special Requests BOTTLES DRAWN AEROBIC ONLY 4CC   Final   Culture  Setup Time     Final   Value: 03/12/2013 13:29     Performed at [facility]   Culture     Final   Value:        BLOOD CULTURE RECEIVED NO GROWTH TO DATE CULTURE WILL BE HELD FOR 5 DAYS BEFORE ISSUING A FINAL NEGATIVE REPORT     Performed at [facility]   Report Status PENDING   Incomplete  URINE CULTURE     Status: None   Collection Time    03/12/13 11:57 AM      Result Value Range Status   Specimen Description URINE, CATHETERIZED   Final   Special Requests NONE   Final   Culture  Setup Time     Final   Value:  03/12/2013 18:10     Performed at [facility] Count     Final   Value: 60,000 COLONIES/ML     Performed at [facility]   Culture     Final   Value: ESCHERICHIA COLI     Performed at [facility]   Report Status 03/14/2013 FINAL   Final   Organism ID, Bacteria ESCHERICHIA COLI   Final  MRSA PCR SCREENING     Status: None   Collection Time    03/12/13  2:25 PM      Result Value Range Status   MRSA by PCR NEGATIVE  NEGATIVE Final  Comment:            The GeneXpert MRSA Assay (FDA     approved for NASAL specimens     only), is one component of a     comprehensive MRSA colonization     surveillance program. It is not     intended to diagnose MRSA     infection nor to guide or     monitor treatment for     MRSA infections.     Labs: Basic Metabolic Panel:  Recent Labs Lab 03/12/13 0845 03/12/13 1618 03/13/13 0428 03/14/13 1210 03/15/13 0715 03/16/13 0309  NA 138  --  137 139 140 141  K 3.9  --  3.4* 3.3* 3.4* 3.7  CL 96  --  99 98 102 105  CO2 21  --  27 27 27 26   GLUCOSE 207*  --  154* 152* 103* 76  BUN 18  --  20 14 14 14   CREATININE 0.89 1.02 0.68 0.79 0.61 0.75  CALCIUM 8.9  --  8.0* 8.5 8.2* 8.6  MG  --  2.1  --   --   --   --   PHOS  --  4.0  --   --   --   --    Liver Function Tests:  Recent Labs Lab 03/10/13 1116 03/12/13 0845  AST 17 42*  ALT 43 53*  ALKPHOS 155* 179*  BILITOT 0.61 0.5  PROT 6.0* 6.3  ALBUMIN 2.7* 2.6*   No results found for this basename: LIPASE, AMYLASE,  in the last 168 hours No results found for this basename: AMMONIA,  in the last 168 hours CBC:  Recent Labs Lab 03/10/13 1116 03/12/13 0845 03/12/13 1618 03/13/13 0428 03/15/13 0715  WBC 9.0 15.3* 14.5* 11.8* 7.6  NEUTROABS 6.5  --   --   --   --   HGB 9.4* 9.4* 8.6* 7.9* 7.9*  HCT 28.0* 30.1* 25.8* 24.5* 25.2*  MCV 108.1* 113.6* 107.9* 109.4* 112.5*  PLT 259 384 314 331 346   Cardiac Enzymes:  Recent Labs Lab 03/12/13 0845   TROPONINI <0.30   BNP: BNP (last 3 results)  Recent Labs  03/12/13 0845  PROBNP 2322.0*   CBG:  Recent Labs Lab 03/12/13 0829  GLUCAP 210*       Signed:  Shirley Decamp N  [facility] 03/16/2013, 10:08 AM

## 2013-03-17 ENCOUNTER — Other Ambulatory Visit: Payer: Self-pay | Admitting: Family

## 2013-03-17 ENCOUNTER — Ambulatory Visit: Payer: BC Managed Care – PPO | Admitting: Adult Health

## 2013-03-17 ENCOUNTER — Other Ambulatory Visit: Payer: BC Managed Care – PPO | Admitting: Lab

## 2013-03-18 ENCOUNTER — Ambulatory Visit: Payer: BC Managed Care – PPO

## 2013-03-18 LAB — CULTURE, BLOOD (ROUTINE X 2): Culture: NO GROWTH

## 2013-03-24 ENCOUNTER — Other Ambulatory Visit: Payer: Self-pay | Admitting: Family

## 2013-03-30 ENCOUNTER — Telehealth: Payer: Self-pay | Admitting: *Deleted

## 2013-03-30 ENCOUNTER — Other Ambulatory Visit: Payer: Self-pay | Admitting: *Deleted

## 2013-03-30 DIAGNOSIS — C541 Malignant neoplasm of endometrium: Secondary | ICD-10-CM

## 2013-03-30 MED ORDER — PEGFILGRASTIM INJECTION 6 MG/0.6ML: 6.0000 mg | Freq: Once | SUBCUTANEOUS | Status: DC

## 2013-03-30 NOTE — Telephone Encounter (Signed)
Per Dr. Welton Flakes: OK to see if insurance will cover RN giving the Neulasta injection in home. E[facility] and will follow up on cost later today.

## 2013-03-30 NOTE — Addendum Note (Signed)
Addended by: Wandalee Ferdinand on: 03/30/2013 12:12 PM   Modules accepted: Orders

## 2013-03-30 NOTE — Telephone Encounter (Signed)
Mother called back to report they were told by Dr. Titus Mould office that [name] has  L1,L2 compound fractures per MRI. They are in process of referring her to Dr. Jule Ser, but may take a few days.  [facility] and [facility] not able [facility] Ne[facility]-will need to get specialty pharmacy to mail drug to home. Clarified with [facility] (Outpatient) that they could obtain drug tomorrow if it goes through her insurance OK. Will need to send them script and her insurance information to process.

## 2013-03-30 NOTE — Telephone Encounter (Signed)
[facility] not able to provide drug-needs specialty mail order pharmacy. Confirmed that [facility] can get drug by 12/9 if it goes through insurance clearance. Need script and insurance information to process.

## 2013-03-30 NOTE — Telephone Encounter (Signed)
Asking if she can be taken directly to the procedure room on 03/01/13 appointment (will need the bariatric bed) due to increase in her back pain. Too difficult to get her to wheelchair and then to exam table and then to bed for treatment. Informed her I will look into this-the bed may be occupied by another patient until her 12:30 chemo appointment. Had MRI on 12/4 at [facility] ordered by her PCP, Dr. Izola Price (has not heard from him yet, but plans to call today). Has transport service to bring her to appointment tomorrow and take her home. Not eating, sleeps a lot and having more pain. Physical therapy working with her and she has been walking with a walker and assistance with wheelchair behind her for a few steps. Also asking if her day #2 Neulasta could be given at home by the [facility] nurse? Confirmed with RN that she could give the injection as long as the patient obtained it through her pharmacy. Made her mother aware the co pay could be high on this, but will ask if MD is agreeable to investigate this.

## 2013-03-31 ENCOUNTER — Ambulatory Visit (HOSPITAL_BASED_OUTPATIENT_CLINIC_OR_DEPARTMENT_OTHER): Payer: BC Managed Care – PPO | Admitting: Adult Health

## 2013-03-31 ENCOUNTER — Other Ambulatory Visit (HOSPITAL_BASED_OUTPATIENT_CLINIC_OR_DEPARTMENT_OTHER): Payer: BC Managed Care – PPO

## 2013-03-31 ENCOUNTER — Encounter: Payer: Self-pay | Admitting: Adult Health

## 2013-03-31 ENCOUNTER — Ambulatory Visit (HOSPITAL_BASED_OUTPATIENT_CLINIC_OR_DEPARTMENT_OTHER): Payer: BC Managed Care – PPO

## 2013-03-31 ENCOUNTER — Encounter: Payer: Self-pay | Admitting: Oncology

## 2013-03-31 VITALS — BP 109/72 | HR 108 | Temp 98.1°F | Resp 22 | Ht 71.0 in

## 2013-03-31 DIAGNOSIS — C549 Malignant neoplasm of corpus uteri, unspecified: Secondary | ICD-10-CM

## 2013-03-31 DIAGNOSIS — S32009A Unspecified fracture of unspecified lumbar vertebra, initial encounter for closed fracture: Secondary | ICD-10-CM

## 2013-03-31 DIAGNOSIS — C541 Malignant neoplasm of endometrium: Secondary | ICD-10-CM

## 2013-03-31 DIAGNOSIS — M549 Dorsalgia, unspecified: Secondary | ICD-10-CM

## 2013-03-31 DIAGNOSIS — C786 Secondary malignant neoplasm of retroperitoneum and peritoneum: Secondary | ICD-10-CM

## 2013-03-31 DIAGNOSIS — B373 Candidiasis of vulva and vagina: Secondary | ICD-10-CM

## 2013-03-31 LAB — COMPREHENSIVE METABOLIC PANEL (CC13)
ALT: 31 U/L (ref 0–55)
AST: 39 U/L — ABNORMAL HIGH (ref 5–34)
Albumin: 2.5 g/dL — ABNORMAL LOW (ref 3.5–5.0)
Alkaline Phosphatase: 133 U/L (ref 40–150)
BUN: 7.4 mg/dL (ref 7.0–26.0)
Chloride: 102 mEq/L (ref 98–109)
Potassium: 3.5 mEq/L (ref 3.5–5.1)
Sodium: 142 mEq/L (ref 136–145)
Total Protein: 6 g/dL — ABNORMAL LOW (ref 6.4–8.3)

## 2013-03-31 LAB — CBC WITH DIFFERENTIAL/PLATELET
BASO%: 1.4 % (ref 0.0–2.0)
Basophils Absolute: 0.1 10*3/uL (ref 0.0–0.1)
EOS%: 3 % (ref 0.0–7.0)
LYMPH%: 16.6 % (ref 14.0–49.7)
MCH: 34.1 pg — ABNORMAL HIGH (ref 25.1–34.0)
MCV: 109 fL — ABNORMAL HIGH (ref 79.5–101.0)
MONO%: 10.2 % (ref 0.0–14.0)
Platelets: 339 10*3/uL (ref 145–400)
RBC: 3.13 10*6/uL — ABNORMAL LOW (ref 3.70–5.45)
RDW: 20.4 % — ABNORMAL HIGH (ref 11.2–14.5)
lymph#: 1.2 10*3/uL (ref 0.9–3.3)

## 2013-03-31 MED ORDER — HEPARIN SOD (PORK) LOCK FLUSH 100 UNIT/ML IV SOLN
500.0000 [IU] | Freq: Once | INTRAVENOUS | Status: AC
  Administered 2013-03-31: 500 [IU] via INTRAVENOUS
  Filled 2013-03-31: qty 5

## 2013-03-31 MED ORDER — SODIUM CHLORIDE 0.9 % IJ SOLN
10.0000 mL | INTRAMUSCULAR | Status: DC | PRN
  Administered 2013-03-31: 10 mL via INTRAVENOUS
  Filled 2013-03-31: qty 10

## 2013-03-31 MED ORDER — ALTEPLASE 2 MG IJ SOLR
2.0000 mg | Freq: Once | INTRAMUSCULAR | Status: AC
  Administered 2013-03-31: 2 mg
  Filled 2013-03-31: qty 2

## 2013-03-31 NOTE — Progress Notes (Addendum)
[facility]  Telephone:([phone] [phone]  OFFICE PROGRESS NOTE    PATIENT: [name]   DOB: November 16, 1965  MR#: [id number]  JYN#:[id number]   QM:VHQION, Jeannett Senior, MD GYN ONC: Laurette Schimke, M.D. SU:  Manus Rudd, M.D.    DIAGNOSIS: [name] is a 47 y.o. female with a prior history of stage IA uterine cancer diagnosed in 2010.  Now with recurrent disease diagnosed in 08/2012.  PRIOR THERAPY: #1  Patient developed excessive uterine bleeding.  She underwent dilatation and curettage with hysteroscopy. 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 Due to morbid obesity Mirena IUD was placed along with Aygestin. Patient had a dramatic directed weight loss with nutritional support she went from 523 pounds to 359 pounds in 10/2009.  On 12/27/2009 she underwent a robotic-assisted laparoscopic hysterectomy, bilateral salpingo-oophorectomy, and mini laparotomy through the umbilical port with morcellation of uterus within about the delivery of the uterus. The final pathology revealed an endometrial adenocarcinoma grade 2 with invasion limited to 1 mm of the myometrium.  #2  Patient continued to do well until 09/2012 when she developed right upper quadrant pain. It was presumed to be cholelithiasis. 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 Omental biopsies were obtained. The pathology was consistent with metastatic adenocarcinoma with the presumption of recurrent endometrial carcinoma.  She was seen by Dr. Laurette Schimke on 10/07/2012.  Dr. Nelly Rout recommended that [name] undergo chemotherapy consisting of Taxol and Carboplatinum.  Chemotherapy started on 11/04/2012.   #3 Abdominal ascites with multiple paracenteses.  #4 Back pain and subsequent MRI on 02/05/2013 revealed compression fractures of T10 and T11.    #5 Status post kyphoplasty with Dr. Julieanne Cotton on 02/26/2013.  Patient states her back pain continues.  #6 Patient hospitalized and underwent more MRI's on 12/4 and L1 and L2 compression fractures were noted, a consultation with Dr. Newell Coral is pending.    CURRENT THERAPY:  Taxol/Carboplatin given every 21 days with 8 cycles planned.  Cycle 7 day 1.   INTERVAL HISTORY: [name] returns today for evaluation prior to receiving her seventh cycle of Taxol Carbo.  She is in a lot of pain due to her newly discovered L1 and L2 compression fractures, and barely able to stand up due to her pain.  She is in a wheelchair and I am talking to her in the procedure room because she is only comfortable when she lays down.  She denies any increased weakness, or numbness, she does report the ascites returning in her abdomen.  Otherwise, a 10 point ROS is neg.    PAST MEDICAL HISTORY: Past Medical History  Diagnosis Date  . Obesity   . DVT (deep venous thrombosis) 2009  . PONV (postoperative nausea and vomiting)   . Swelling     BOTH LEGS  . Rash     LOWER ABD  . Gallstones   . Ascites   . Uterine cancer   . Family history of anesthesia complication     MOTHER IS DIFFICULT TO WAKE  . Gallstones   . Cellulitis of leg, right     PAST SURGICAL HISTORY: Past Surgical History  Procedure Laterality Date  . Ankle surgery  1950  . Wisdom tooth extraction    . Tonsillectomy and adenoidectomy    . Hysteroscopy w/d&c    . Abdominal hysterectomy  12/2009    RLH, BSO  .  Laparoscopy N/A 10/02/2012    Procedure: LAPAROSCOPY DIAGNOSTIC, perocentisis, omental biopsy;  Surgeon: Wilmon Arms. Tsuei, MD;  Location: [facility];  Service: General;  Laterality: N/A;  removed a total of 15,000 of acities  . Paracentesis    . Kyphoplasty  02/26/2013    FAMILY HISTORY: Family History  Problem Relation Age of Onset  . Heart disease Father   . Diabetes Brother   . Heart disease Mother     SOCIAL  HISTORY: History  Substance Use Topics  . Smoking status: Never Smoker   . Smokeless tobacco: Never Used  . Alcohol Use: Yes     Comment: social, Occassionally    ALLERGIES: No Known Allergies   MEDICATIONS:  Current Outpatient Prescriptions  Medication Sig Dispense Refill  . Ascorbic Acid (VITAMIN C) 1000 MG tablet Take 1,000 mg by mouth daily.      Marland Kitchen b complex vitamins capsule Take 1 capsule by mouth daily.      . Cyanocobalamin (VITAMIN B-12 PO) Take 2,500 mg by mouth daily.       . cyclobenzaprine (FLEXERIL) 5 MG tablet Take 5 mg by mouth 3 (three) times daily as needed for muscle spasms.      . ferrous sulfate 325 (65 FE) MG tablet Take 325 mg by mouth 3 (three) times daily with meals.      . folic acid (FOLVITE) 400 MCG tablet Take 400 mcg by mouth daily.        . furosemide (LASIX) 20 MG tablet Take 1 tablet (20 mg total) by mouth every other day.  30 tablet  0  . lidocaine-prilocaine (EMLA) cream Apply 1 application topically as needed. Apply to port-a-cath site 1-2 hours prior to treatment.      Marland Kitchen loratadine (CLARITIN) 10 MG tablet Take 10 mg by mouth daily.      . magnesium gluconate (MAGONATE) 500 MG tablet Take 500 mg by mouth daily.      . Multiple Vitamins-Minerals (MULTIVITAMIN WITH MINERALS) tablet Take 1 tablet by mouth daily.       Marland Kitchen nystatin ointment (MYCOSTATIN) Apply 1 application topically 2 (two) times daily.      . ondansetron (ZOFRAN) 8 MG tablet Take 8 mg by mouth 2 (two) times daily. Take two times a day starting the day after chemo for 3 days. Then take two times a day as needed for nausea or vomiting.      Marland Kitchen oxyCODONE-acetaminophen (PERCOCET) 10-325 MG per tablet Take 1 tablet by mouth every 4 (four) hours as needed for pain.      Marland Kitchen PACLitaxel (TAXOL IV) Inject 1 each into the vein every 21 ( twenty-one) days.      . pantoprazole (PROTONIX) 40 MG tablet Take 40 mg by mouth daily.      . pegfilgrastim (NEULASTA) 6 MG/0.6ML injection Inject 6 mg into the  skin every 21 ( twenty-one) days.      . pegfilgrastim (NEULASTA) 6 MG/0.6ML injection Inject 0.6 mLs (6 mg total) into the skin once. On day after each chemotherapy  1 Syringe  1  . PRESCRIPTION MEDICATION every 21 ( twenty-one) days. CHEMOTHERAPY REGIMEN      . prochlorperazine (COMPAZINE) 10 MG tablet Take 10 mg by mouth every 6 (six) hours as needed (Nausea or vomiting).      . sucralfate (CARAFATE) 1 GM/10ML suspension Take 1 g by mouth 4 (four) times daily.      Marland Kitchen UNABLE TO FIND Heavy duty rolling walker  Diagnosis: Endometrial cancer  182.0; Ascites 789.59; severe fatigue 780.79  1 Units  0  . UNABLE TO FIND Super heavy duty wheelchair with anit-tippers and leg rests with heel loops  Diagnosis: Endometrial cancer 182.0; Ascites 789.59; severe fatigue 780.79, thoracic compression fractures 733.13  1 Units  0  . dextromethorphan-guaiFENesin (MUCINEX DM) 30-600 MG per 12 hr tablet Take 1 tablet by mouth 2 (two) times daily.      . promethazine (PHENERGAN) 25 MG tablet Take 12.5 mg by mouth every 6 (six) hours as needed for nausea or vomiting.       No current facility-administered medications for this visit.      REVIEW OF SYSTEMS: A 10 point review of systems was completed and is negative except as noted above.   PHYSICAL EXAMINATION: BP 109/72  Pulse 108  Temp(Src) 98.1 F (36.7 C) (Oral)  Resp 22  Ht 5\' 11"  (1.803 m)  General: Patient is a well appearing female in no acute distress HEENT: PERRLA, sclerae anicteric no conjunctival pallor, MMM Neck: supple, no palpable adenopathy Lungs: clear to auscultation bilaterally, no wheezes, rhonchi, or rales Cardiovascular: regular rate rhythm, S1, S2, no murmurs, rubs or gallops Abdomen: Soft, non-tender, non-distended, normoactive bowel sounds, no HSM, limited exam patient morbidly obese Extremities: warm and well perfused, no clubbing, cyanosis, or edema, strength 5/5 all extremities Skin: No rashes or lesions Neuro:  Non-focal ECOG FS:  3 - Symptomatic, >50% confined to bed   LAB RESULTS: Lab Results  Component Value Date   WBC 7.4 03/31/2013   NEUTROABS 5.1 03/31/2013   HGB 10.7* 03/31/2013   HCT 34.1* 03/31/2013   MCV 109.0* 03/31/2013   PLT 339 03/31/2013      Chemistry      Component Value Date/Time   NA 141 03/16/2013 0309   NA 139 03/10/2013 1116   K 3.7 03/16/2013 0309   K 3.1* 03/10/2013 1116   CL 105 03/16/2013 0309   CL 98 10/07/2012 0923   CO2 26 03/16/2013 0309   CO2 23 03/10/2013 1116   BUN 14 03/16/2013 0309   BUN 10.9 03/10/2013 1116   CREATININE 0.75 03/16/2013 0309   CREATININE 0.7 03/10/2013 1116      Component Value Date/Time   CALCIUM 8.6 03/16/2013 0309   CALCIUM 9.1 03/10/2013 1116   ALKPHOS 179* 03/12/2013 0845   ALKPHOS 155* 03/10/2013 1116   AST 42* 03/12/2013 0845   AST 17 03/10/2013 1116   ALT 53* 03/12/2013 0845   ALT 43 03/10/2013 1116   BILITOT 0.5 03/12/2013 0845   BILITOT 0.61 03/10/2013 1116       No results found for this basename: LABCA2    No components found with this basename: [id number]     RADIOGRAPHIC STUDIES: Ir Vertebroplasty Or Sacroplasty 03/02/2013   CLINICAL DATA:  Patient with severely painful compression fracture at T10 and T11. History of endometrial carcinoma. On chemotherapy.  EXAM: VERTEBROPLASTY FL AT T10 and T11.  CORE BIOPSIES OBTAINED  MEDICATIONS: Versed 4 mg. mg IV, Fentanyl 100  mcg IV.  ANESTHESIA/SEDATION: Total Moderate Sedation Time:  35 min.  FLUOROSCOPY TIME:  8 min 48 seconds. Marland Kitchen  PROCEDURE: Following a full explanation of the procedure along with the potentially associated complications, a witnessed informed consent was obtained.  The patient was placed prone on the fluoroscopic table. Nasal oxygen was administered. Physiologic monitoring was performed throughout the duration of the procedure. The skin overlying the thoracic region was prepped and draped in the usual sterile fashion. The T10 and  T11 vertebral bodies  were identified and the right pedicle at T10, and the left pedicle at T11 were infiltrated with 0.25% Bupivacaine. This was then followed by the advancement of a 13-gauge Cook needle through both the pedicles into the anterior one-third at both levels. Prior to this, 18 gauge core biopsy needles were used to obtain 4 samples of both the levels using a 20 mL syringe. These were sent for pathologic analysis. A gentle contrast injection demonstrated a trabecular pattern of contrast.  At this time, methylmethacrylate mixture was reconstituted. Under biplane intermittent fluoroscopy, the methylmethacrylate was then injected into the T10 and T11 vertebral body with filling of the vertebral body at T10 and T12.  Mild extravasation was noted into the disk spaces through the inferior endplate fracture clefts . No epidural venous contamination was seen.  The needles were then removed. Hemostasis was achieved at the skin entry site.  There were no acute complications. Patient tolerated the procedure well. The patient was observed for 3 hours and discharged in good condition.  IMPRESSION: Status post vertebral body augmentation for painful compression fracture at T10 and T11 using vertebroplasty technique.  Core biopsies obtained at T10 and T11. Sample sent for pathologic analysis.   Electronically Signed   By: Julieanne Cotton M.D.   On: 02/26/2013 13:10      ASSESSMENT: [name] is a 47 y.o. woman:  #1 Recurrent uterine cancer with peritoneal carcinomatosis and ascites.  Port a cath was placed in the OR, and the patient has been treated in the palliative setting with Taxol and Carboplatinum that started on 11/04/2012.    #2 Ascites: History of having paracenteses 1-2 times per week, she hasn't received/required a paracentesis since 11/24/2012.  #3 Back pain - Compression fractures at T10 and T11 per MRI on 02/05/2013.  Status post kyphoplasty on 02/26/2013.  Patient with repeat MRI on 12/4 demonstrating  compression fractures at L1 and L2.     PLAN: #1 Patient will proceed with cycle 7 of Taxol/Carbo today.  Her labs are stable.  They were reviewed with the patient and her mother.  I spent a long time with the mother and patient discussing the treatment, weakness, and getting the patient in with Dr. Newell Coral.    #2  We continue to await consultation with Dr. Newell Coral regarding her compression fractures and severe pain.     #3 [name] will return tomorrow for Neulasta and in one week for labs and evaluation.    All questions were answered. The patient and her family were encouraged to contact us in the interim with any problems, questions or concerns.   I spent 50 minutes counseling the patient face to face.  The total time spent in the appointment was 60 minutes.  Illa Level, NP Medical Oncology [facility] [phone]  03/31/2013  12:25 PM    ATTENDING'S ATTESTATION:  I personally reviewed patient's chart, examined patient myself, formulated the treatment plan as followed.   Still with pain likely due to compression fractures, seen by Dr. Newell Coral, he has recommended patient be seen by IR for another kyphoplasty. Discussed with patient. She will continue chemotherapy and afterward will plan to restage.  Drue Second, MD Medical/Oncology [facility] [phone] (beeper) [phone] (Office)  04/17/2013, 7:31 AM

## 2013-03-31 NOTE — Progress Notes (Signed)
Faxed neulasta prescription to [facility].

## 2013-03-31 NOTE — Telephone Encounter (Signed)
RECEIVED A FAX FROM [facility] CONCERNING A CONFIRMATION OF FACSIMILE RECEIPT FOR PT. REFERRAL. 

## 2013-03-31 NOTE — Progress Notes (Signed)
Per Dr. Welton Flakes, patient to receive chemotherapy today. Due to length of time of treatment, patient would not be finished till approximently 8pm. Gladis Riffle notified. Suggested to moved treatment to tomorrow. Appointment available for 10am 04/01/13. Patient notified of this - patient agreeable to move appointment to tomorrow. Per pt request, PAC left accessed for treatment tomorrow. [facility] called for transportation home. Upon arrival, patient loaded onto stretcher without any difficulty. AVS given to patient and pt reminded of appt tomorrow at 10am. [facility] called by this RN and transportation arranged for 12/10. Angelena Form, RN

## 2013-04-01 ENCOUNTER — Ambulatory Visit: Payer: BC Managed Care – PPO

## 2013-04-01 ENCOUNTER — Other Ambulatory Visit: Payer: Self-pay | Admitting: *Deleted

## 2013-04-01 ENCOUNTER — Ambulatory Visit (HOSPITAL_BASED_OUTPATIENT_CLINIC_OR_DEPARTMENT_OTHER): Payer: BC Managed Care – PPO

## 2013-04-01 VITALS — BP 127/81 | HR 103 | Temp 96.8°F | Resp 20

## 2013-04-01 DIAGNOSIS — C549 Malignant neoplasm of corpus uteri, unspecified: Secondary | ICD-10-CM

## 2013-04-01 DIAGNOSIS — C541 Malignant neoplasm of endometrium: Secondary | ICD-10-CM

## 2013-04-01 DIAGNOSIS — Z5111 Encounter for antineoplastic chemotherapy: Secondary | ICD-10-CM

## 2013-04-01 DIAGNOSIS — C786 Secondary malignant neoplasm of retroperitoneum and peritoneum: Secondary | ICD-10-CM

## 2013-04-01 MED ORDER — HEPARIN SOD (PORK) LOCK FLUSH 100 UNIT/ML IV SOLN
500.0000 [IU] | Freq: Once | INTRAVENOUS | Status: AC
  Administered 2013-04-01: 500 [IU] via INTRAVENOUS
  Filled 2013-04-01: qty 5

## 2013-04-01 MED ORDER — PALONOSETRON HCL INJECTION 0.25 MG/5ML
INTRAVENOUS | Status: AC
  Filled 2013-04-01: qty 5

## 2013-04-01 MED ORDER — OXYCODONE-ACETAMINOPHEN 5-325 MG PO TABS
2.0000 | ORAL_TABLET | Freq: Once | ORAL | Status: AC
  Administered 2013-04-01: 2 via ORAL

## 2013-04-01 MED ORDER — DIPHENHYDRAMINE HCL 50 MG/ML IJ SOLN
50.0000 mg | Freq: Once | INTRAMUSCULAR | Status: AC
  Administered 2013-04-01: 50 mg via INTRAVENOUS

## 2013-04-01 MED ORDER — FLUCONAZOLE 200 MG PO TABS: 200.0000 mg | ORAL_TABLET | Freq: Every day | ORAL | Status: DC

## 2013-04-01 MED ORDER — SODIUM CHLORIDE 0.9 % IJ SOLN
10.0000 mL | INTRAMUSCULAR | Status: DC | PRN
  Administered 2013-04-01: 10 mL via INTRAVENOUS
  Filled 2013-04-01: qty 10

## 2013-04-01 MED ORDER — PACLITAXEL CHEMO INJECTION 300 MG/50ML
175.0000 mg/m2 | Freq: Once | INTRAVENOUS | Status: AC
  Administered 2013-04-01: 618 mg via INTRAVENOUS
  Filled 2013-04-01: qty 103

## 2013-04-01 MED ORDER — FAMOTIDINE IN NACL 20-0.9 MG/50ML-% IV SOLN
INTRAVENOUS | Status: AC
  Filled 2013-04-01: qty 50

## 2013-04-01 MED ORDER — CARBOPLATIN CHEMO INTRADERMAL TEST DOSE 100MCG/0.02ML
100.0000 ug | Freq: Once | INTRADERMAL | Status: AC
  Administered 2013-04-01: 100 ug via INTRADERMAL
  Filled 2013-04-01: qty 0.01

## 2013-04-01 MED ORDER — DEXAMETHASONE SODIUM PHOSPHATE 20 MG/5ML IJ SOLN
12.0000 mg | Freq: Once | INTRAMUSCULAR | Status: AC
  Administered 2013-04-01: 12 mg via INTRAVENOUS

## 2013-04-01 MED ORDER — PALONOSETRON HCL INJECTION 0.25 MG/5ML
0.2500 mg | Freq: Once | INTRAVENOUS | Status: AC
  Administered 2013-04-01: 0.25 mg via INTRAVENOUS

## 2013-04-01 MED ORDER — DIPHENHYDRAMINE HCL 50 MG/ML IJ SOLN
INTRAMUSCULAR | Status: AC
  Filled 2013-04-01: qty 1

## 2013-04-01 MED ORDER — SODIUM CHLORIDE 0.9 % IV SOLN
Freq: Once | INTRAVENOUS | Status: AC
  Administered 2013-04-01: 10:00:00 via INTRAVENOUS

## 2013-04-01 MED ORDER — FAMOTIDINE IN NACL 20-0.9 MG/50ML-% IV SOLN
20.0000 mg | Freq: Once | INTRAVENOUS | Status: AC
  Administered 2013-04-01: 20 mg via INTRAVENOUS

## 2013-04-01 MED ORDER — DEXAMETHASONE SODIUM PHOSPHATE 20 MG/5ML IJ SOLN
INTRAMUSCULAR | Status: AC
  Filled 2013-04-01: qty 5

## 2013-04-01 MED ORDER — SODIUM CHLORIDE 0.9 % IV SOLN
900.0000 mg | Freq: Once | INTRAVENOUS | Status: AC
  Administered 2013-04-01: 900 mg via INTRAVENOUS
  Filled 2013-04-01: qty 90

## 2013-04-01 MED ORDER — OXYCODONE-ACETAMINOPHEN 5-325 MG PO TABS
ORAL_TABLET | ORAL | Status: AC
  Filled 2013-04-01: qty 2

## 2013-04-01 MED ORDER — FOSAPREPITANT DIMEGLUMINE INJECTION 150 MG
150.0000 mg | Freq: Once | INTRAVENOUS | Status: AC
  Administered 2013-04-01: 150 mg via INTRAVENOUS
  Filled 2013-04-01: qty 5

## 2013-04-01 MED ORDER — OXYCODONE-ACETAMINOPHEN 10-325 MG PO TABS: 1.0000 | ORAL_TABLET | ORAL | Status: DC | PRN

## 2013-04-01 NOTE — Patient Instructions (Signed)
[facility] Discharge Instructions for Patients Receiving Chemotherapy  Today you received the following chemotherapy agents: Carboplatin, Taxol.  To help prevent nausea and vomiting after your treatment, we encourage you to take your nausea medication.    If you develop nausea and vomiting that is not controlled by your nausea medication, call the clinic.   BELOW ARE SYMPTOMS THAT SHOULD BE REPORTED IMMEDIATELY:  *FEVER GREATER THAN 100.5 F  *CHILLS WITH OR WITHOUT FEVER  NAUSEA AND VOMITING THAT IS NOT CONTROLLED WITH YOUR NAUSEA MEDICATION  *UNUSUAL SHORTNESS OF BREATH  *UNUSUAL BRUISING OR BLEEDING  TENDERNESS IN MOUTH AND THROAT WITH OR WITHOUT PRESENCE OF ULCERS  *URINARY PROBLEMS  *BOWEL PROBLEMS  UNUSUAL RASH Items with * indicate a potential emergency and should be followed up as soon as possible.  Feel free to call the clinic you have any questions or concerns. The clinic phone number is [phone].    

## 2013-04-01 NOTE — Progress Notes (Signed)
Pt requested to get injection here tomorrow instead of having [facility] RN give at home. Instructed patient to keep injection appointment as scheduled and call [facility] and cancel request for Neulasta from home health nurse.

## 2013-04-02 ENCOUNTER — Encounter: Payer: Self-pay | Admitting: *Deleted

## 2013-04-02 ENCOUNTER — Ambulatory Visit: Payer: BC Managed Care – PPO | Attending: Gynecologic Oncology | Admitting: Gynecologic Oncology

## 2013-04-02 ENCOUNTER — Ambulatory Visit (HOSPITAL_BASED_OUTPATIENT_CLINIC_OR_DEPARTMENT_OTHER): Payer: BC Managed Care – PPO

## 2013-04-02 ENCOUNTER — Encounter: Payer: Self-pay | Admitting: Gynecologic Oncology

## 2013-04-02 VITALS — BP 116/88 | HR 95 | Temp 97.6°F | Resp 22 | Wt >= 6400 oz

## 2013-04-02 DIAGNOSIS — C549 Malignant neoplasm of corpus uteri, unspecified: Secondary | ICD-10-CM

## 2013-04-02 DIAGNOSIS — Z5189 Encounter for other specified aftercare: Secondary | ICD-10-CM

## 2013-04-02 DIAGNOSIS — C541 Malignant neoplasm of endometrium: Secondary | ICD-10-CM

## 2013-04-02 MED ORDER — PEGFILGRASTIM INJECTION 6 MG/0.6ML
6.0000 mg | Freq: Once | SUBCUTANEOUS | Status: AC
  Administered 2013-04-02: 6 mg via SUBCUTANEOUS
  Filled 2013-04-02: qty 0.6

## 2013-04-02 NOTE — Patient Instructions (Signed)
F/U in 2 months  Happy Holidays    Thank you very much [name] for allowing me to provide care for you today.  I appreciate your confidence in choosing our Gynecologic Oncology team.  If you have any questions about your visit today please call our office and we will get back to you as soon as possible.  [name]. Yeni Jiggetts MD., PhD Gynecologic Oncology

## 2013-04-02 NOTE — Progress Notes (Signed)
Office Visit:  GYN ONCOLOGY   [name] 47 y.o. female  CC: Endometrial cancer surveillance  Assessment/Plan: [name] is a 47 y.o. with Stage IA, grade 2 endometrial adenocarcinoma initially diagnosed in March of 2009. Robotic hysterectomy bilateral salpingo-oophorectomy with minilaparotomy for removal of the specimen was performed in September 2011. She presented with evidence of peritoneal carcinomatosis and pathology consistent with glandular adenocarcinoma in May of 2014 presumptive endometrial cancer recurrence.  [name] is  s/p 4 cycles Taxol and platinum therapy.     The patient's volume of distribution is much larger than the normal patient because of her weight.  The taxol/carbo formulas have upper limits of administration.  She does not have alopecia or any of the complaints usually associated with these agents.  However she is responding and has not required a paracentesis for several weeks.  Reimage and check CA 125 after the final planned cycle of chemotherapy.  Taxol/Carboplatin has resulted in control of ascites.  However the patient's QOL is poor as the result of the compression fractures.  I am concerned that she is dwindling.    Followup in 2 months   HPI:  [name] is a 47 y.o.  diagnosed with a DVT in March of 2009 with Coumadin initiated at that time.  Excessive uterine bleeding developed leading to a dilatation and curettage with hysteroscopy.  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  Due to her morbid obesity, a Mirena IUD was placed along with the addition of Aygestin.  After dramatic, directed weight loss and nutritional support, she went from 523 lbs on her initial visit to 359 lbs in July of 2011.  Her BMI was reduced to 50.  On December 27, 2009, she underwent a robotic-assisted laparoscopic hysterectomy, bilateral salpingo-oophorectomy, and mini-laparotomy through the umbilical port with morcellation of the uterus within a  bag for delivery of the uterus. 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 Omental biopsies were obtained. Pathology was consistent with metastatic adenocarcinoma.  INTERVAL HISTORY [name] has received 7 cycles of taxol/ carboplatin.  There has been excellent response with respect to ascites.  She still does not have alopecia.  She denies constipation, neuropathy.  She is quite distressed about her inability to ambulate and her low back pain that limits her activities of daily living and quality of life.  Past Surgical Hx:  Past Surgical History  Procedure Laterality Date  . Ankle surgery  1950  . Wisdom tooth extraction    . Tonsillectomy and adenoidectomy    . Hysteroscopy w/d&c    . Abdominal hysterectomy  12/2009    [facility], BSO  . Laparoscopy N/A 10/02/2012    Procedure: LAPAROSCOPY DIAGNOSTIC, perocentisis, omental biopsy;  Surgeon: Wilmon Arms. Tsuei, MD;  Location: [facility];  Service: General;  Laterality: N/A;  removed a total of 15,000 of acities  . Paracentesis    . Kyphoplasty  02/26/2013   Past Medical Hx:  Past Medical History  Diagnosis Date  . Obesity   . DVT (deep venous thrombosis) 2009  . PONV (postoperative nausea and vomiting)   . Swelling     BOTH LEGS  . Rash     LOWER ABD  . Gallstones   . Ascites   .  Uterine cancer   . Family history of anesthesia complication     MOTHER IS DIFFICULT TO WAKE  . Gallstones   . Cellulitis of leg, right    Family Hx:  Family History  Problem Relation Age of Onset  . Heart disease Father   . Diabetes Brother   . Heart disease Mother    REVIEW OF SYSTEMS Constitutional feels tired.  Reports back pain almost daily. Cardiovascular  No chest pain,  shortness of breath, or edema  Pulmonary  No cough or wheeze.  Gastro Intestinal  No nausea, vomitting, or diarrhoea. No bright red blood per rectum, No change in bowel movement, or constipation. No bloating. Genito Urinary   Denies vaginal bleeding Musculo Skeletal  No myalgia, arthralgia, joint swelling.  Lower back pain  Neurologic  Lower extremity weakness, no numbness, limited  gait,  Vitals:  Blood pressure 116/88, pulse 95, temperature 97.6 F (36.4 C), temperature source Oral, resp. rate 22, weight 434 lb 3.2 oz (196.952 kg).  Physical Exam:  General:  Well developed, morbidly obese in NAD. Significant amount of hair on her head. Patient in distress, appears depressed. Chest:  CTA Cardiac RRR. Abdomen: Obese soft, right sided abdominal wall and breast edema/brawniness with erythema. Extremities:  2-3+ bilateral edema Pelvic:  Examination limited by body habitus.  No palpable vaginal masses, no discharge or bleeding..  Back:  No CVAT   Laurette Schimke, NP 04/02/2013, 12:01 PM

## 2013-04-02 NOTE — Progress Notes (Signed)
[facility] Clinical Social Work  Clinical Social Work was referred by nurse and patient for assessment of psychosocial needs due to Pt being told by MD she could not return to work indefinitely.  Clinical Social Worker met with patient and her mother on 04/01/13 in the chemo/procedure room  to offer support and assess for needs.  Pt reports she was able to work from home until recently, but had not been to her office since May due to mobility issues and her illness. Pt eager to return to work and not sure she will file for disability. CSW explained process and ways to apply. Pt is homebound due to only being able to be transported by ambulance currently.   Pt reports to live with her parents and her brother. She reports to have a good support network and resources at home. Pt concerned about not returning to work as her employer has provided good insurance benefits and is holding her job for her. Pt concerned about insurance coverage if no longer employed. Pt to consider her options and CSW to prepare packet and provide to Pt after her appt with MD on 04/02/13.    Doreen Salvage, LCSW Clinical Social Worker [facility] Wednesday, Thursday and Friday Phone: [phone] Fax: [phone]

## 2013-04-02 NOTE — Progress Notes (Unsigned)
[facility] Clinical Social Work  Clinical Social Work met with Pt and her mother to further discuss disability application and COBRA. Pt aware that due to her limited mobility, but computer access at home the quickest option to apply for disability would be online. CSW provided Pt with list of info requested by ss to complete application and a release of info form that she will need as well. Pt is still unsure if she will apply currently. CSW discussed she may have to utilize a COBRA policy if she is no longer employed and waiting for disability. Pt stated understanding of how to apply for disability and will reach out to CSW as needed.  Clinical Social Work interventions: Resource and referral  Catherine Salvage, LCSW Clinical Social Worker [facility] Wednesday, Thursday and Friday Phone: [phone] Fax: [phone]

## 2013-04-07 ENCOUNTER — Telehealth: Payer: Self-pay | Admitting: *Deleted

## 2013-04-07 ENCOUNTER — Ambulatory Visit (HOSPITAL_BASED_OUTPATIENT_CLINIC_OR_DEPARTMENT_OTHER): Payer: BC Managed Care – PPO | Admitting: Adult Health

## 2013-04-07 ENCOUNTER — Other Ambulatory Visit (HOSPITAL_BASED_OUTPATIENT_CLINIC_OR_DEPARTMENT_OTHER): Payer: BC Managed Care – PPO

## 2013-04-07 ENCOUNTER — Telehealth: Payer: Self-pay | Admitting: Oncology

## 2013-04-07 ENCOUNTER — Encounter: Payer: Self-pay | Admitting: Oncology

## 2013-04-07 VITALS — BP 111/73 | HR 108 | Temp 97.7°F | Resp 20 | Ht 71.0 in | Wt >= 6400 oz

## 2013-04-07 DIAGNOSIS — B3789 Other sites of candidiasis: Secondary | ICD-10-CM

## 2013-04-07 DIAGNOSIS — C541 Malignant neoplasm of endometrium: Secondary | ICD-10-CM

## 2013-04-07 DIAGNOSIS — IMO0002 Reserved for concepts with insufficient information to code with codable children: Secondary | ICD-10-CM

## 2013-04-07 DIAGNOSIS — B358 Other dermatophytoses: Secondary | ICD-10-CM

## 2013-04-07 DIAGNOSIS — D509 Iron deficiency anemia, unspecified: Secondary | ICD-10-CM

## 2013-04-07 DIAGNOSIS — C549 Malignant neoplasm of corpus uteri, unspecified: Secondary | ICD-10-CM

## 2013-04-07 DIAGNOSIS — R180 Malignant ascites: Secondary | ICD-10-CM

## 2013-04-07 DIAGNOSIS — C7800 Secondary malignant neoplasm of unspecified lung: Secondary | ICD-10-CM

## 2013-04-07 DIAGNOSIS — E876 Hypokalemia: Secondary | ICD-10-CM

## 2013-04-07 LAB — COMPREHENSIVE METABOLIC PANEL (CC13)
AST: 48 U/L — ABNORMAL HIGH (ref 5–34)
Albumin: 2.8 g/dL — ABNORMAL LOW (ref 3.5–5.0)
Anion Gap: 10 mEq/L (ref 3–11)
BUN: 6.6 mg/dL — ABNORMAL LOW (ref 7.0–26.0)
CO2: 29 mEq/L (ref 22–29)
Calcium: 9.2 mg/dL (ref 8.4–10.4)
Chloride: 100 mEq/L (ref 98–109)
Creatinine: 0.6 mg/dL (ref 0.6–1.1)
Glucose: 114 mg/dl (ref 70–140)
Potassium: 3.4 mEq/L — ABNORMAL LOW (ref 3.5–5.1)
Sodium: 139 mEq/L (ref 136–145)

## 2013-04-07 LAB — CBC WITH DIFFERENTIAL/PLATELET
Basophils Absolute: 0 10*3/uL (ref 0.0–0.1)
EOS%: 10.2 % — ABNORMAL HIGH (ref 0.0–7.0)
Eosinophils Absolute: 0.2 10*3/uL (ref 0.0–0.5)
HCT: 29.8 % — ABNORMAL LOW (ref 34.8–46.6)
HGB: 9.6 g/dL — ABNORMAL LOW (ref 11.6–15.9)
LYMPH%: 25.6 % (ref 14.0–49.7)
MCH: 34.4 pg — ABNORMAL HIGH (ref 25.1–34.0)
MONO#: 0.1 10*3/uL (ref 0.1–0.9)
NEUT#: 1.4 10*3/uL — ABNORMAL LOW (ref 1.5–6.5)
NEUT%: 60.5 % (ref 38.4–76.8)
WBC: 2.3 10*3/uL — ABNORMAL LOW (ref 3.9–10.3)
lymph#: 0.6 10*3/uL — ABNORMAL LOW (ref 0.9–3.3)

## 2013-04-07 MED ORDER — MICONAZOLE NITRATE POWD: 1.0000 "application " | Freq: Four times a day (QID) | Status: DC

## 2013-04-07 MED ORDER — FERROUS SULFATE 325 (65 FE) MG PO TABS: 325.0000 mg | ORAL_TABLET | Freq: Three times a day (TID) | ORAL | Status: DC

## 2013-04-07 MED ORDER — SUCRALFATE 1 GM/10ML PO SUSP: 1.0000 g | Freq: Four times a day (QID) | ORAL | Status: DC

## 2013-04-07 MED ORDER — POTASSIUM CHLORIDE ER 10 MEQ PO TBCR: 20.0000 meq | EXTENDED_RELEASE_TABLET | Freq: Every day | ORAL | Status: DC

## 2013-04-07 NOTE — Progress Notes (Signed)
[facility]  Telephone:([phone]) [phone] Fax:(336) [phone]  OFFICE PROGRESS NOTE    PATIENT: [name]   DOB: 12-04-1965  MR#: [id number]  JYN#:[id number]   QM:VHQION, Jeannett Senior, MD GYN ONC: Laurette Schimke, M.D. SU:  Manus Rudd, M.D.    DIAGNOSIS: [name] is a 47 y.o. female with a prior history of stage IA uterine cancer diagnosed in 2010.  Now with recurrent disease diagnosed in 08/2012.  PRIOR THERAPY: #1  Patient developed excessive uterine bleeding.  She underwent dilatation and curettage with hysteroscopy. 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 Due to morbid obesity Mirena IUD was placed along with Aygestin. Patient had a dramatic directed weight loss with nutritional support she went from 523 pounds to 359 pounds in 10/2009.  On 12/27/2009 she underwent a robotic-assisted laparoscopic hysterectomy, bilateral salpingo-oophorectomy, and mini laparotomy through the umbilical port with morcellation of uterus within about the delivery of the uterus. The final pathology revealed an endometrial adenocarcinoma grade 2 with invasion limited to 1 mm of the myometrium.  #2  Patient continued to do well until 09/2012 when she developed right upper quadrant pain. It was presumed to be cholelithiasis. 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 Omental biopsies were obtained. The pathology was consistent with metastatic adenocarcinoma with the presumption of recurrent endometrial carcinoma.  She was seen by Dr. Laurette Schimke on 10/07/2012.  Dr. Nelly Rout recommended that [name] undergo chemotherapy consisting of Taxol and Carboplatinum.  Chemotherapy started on 11/04/2012.   #3 Abdominal ascites with multiple paracenteses.  #4 Back pain and subsequent MRI on 02/05/2013 revealed compression fractures of T10 and T11.    #5 Status post kyphoplasty with Dr. Julieanne Cotton on 02/26/2013.  Patient states her back pain continues.  #6 Patient hospitalized and underwent more MRI's on 12/4 and L1 and L2 compression fractures were noted, after consultation with Dr. Newell Coral, the patient was referred back to interventional radiology for evaluation for kyphoplasty.     CURRENT THERAPY:  Taxol/Carboplatin given every 21 days with 8 cycles planned.  Cycle 7 day 8.   INTERVAL HISTORY: [name] returns today for evaluation following her seventh cycle of Taxol/Carbo.  Her back pain is improved the past 2-3 days.  Her mom is with her and has noticed that her right side appears more distended than the left.  Also she has a bleeding rash under her right breast that started 3 days ago.  She does lay on her right side often.  She had diarrhea this morning and her mother noticed that she had blood in the toilet.  She does have a hemorrhoid and has felt that her rectum has been raw lately.  Otherwise, she denies fevers, chills, nausea, vomiting, constipation, numbness, or further concerns.    PAST MEDICAL HISTORY: Past Medical History  Diagnosis Date  . Obesity   . DVT (deep venous thrombosis) 2009  . PONV (postoperative nausea and vomiting)   . Swelling     BOTH LEGS  . Rash     LOWER ABD  . Gallstones   . Ascites   . Uterine cancer   . Family history of anesthesia complication     MOTHER IS DIFFICULT TO WAKE  . Gallstones   . Cellulitis of leg, right     PAST SURGICAL HISTORY: Past Surgical History  Procedure Laterality Date  . Ankle surgery  1950  .  Wisdom tooth extraction    . Tonsillectomy and adenoidectomy    . Hysteroscopy w/d&c    . Abdominal hysterectomy  12/2009    [facility], [facility]  . Laparoscopy N/A 10/02/2012    Procedure: LAPAROSCOPY DIAGNOSTIC, perocentisis, omental biopsy;  Surgeon: Wilmon Arms. Tsuei, MD;  Location: [facility];  Service: General;  Laterality: N/A;  removed a total of 15,000 of acities  .  Paracentesis    . Kyphoplasty  02/26/2013    FAMILY HISTORY: Family History  Problem Relation Age of Onset  . Heart disease Father   . Diabetes Brother   . Heart disease Mother     SOCIAL HISTORY: History  Substance Use Topics  . Smoking status: Never Smoker   . Smokeless tobacco: Never Used  . Alcohol Use: Yes     Comment: social, Occassionally    ALLERGIES: No Known Allergies   MEDICATIONS:  Current Outpatient Prescriptions  Medication Sig Dispense Refill  . Ascorbic Acid (VITAMIN C) 1000 MG tablet Take 1,000 mg by mouth daily.      Marland Kitchen b complex vitamins capsule Take 1 capsule by mouth daily.      . Cyanocobalamin (VITAMIN B-12 PO) Take 2,500 mg by mouth daily.       . cyclobenzaprine (FLEXERIL) 5 MG tablet Take 5 mg by mouth 3 (three) times daily as needed for muscle spasms.      Marland Kitchen dextromethorphan-guaiFENesin (MUCINEX DM) 30-600 MG per 12 hr tablet Take 1 tablet by mouth 2 (two) times daily.      . ferrous sulfate 325 (65 FE) MG tablet Take 1 tablet (325 mg total) by mouth 3 (three) times daily with meals.  90 tablet  2  . fluconazole (DIFLUCAN) 200 MG tablet Take 1 tablet (200 mg total) by mouth daily.  7 tablet  0  . folic acid (FOLVITE) 400 MCG tablet Take 400 mcg by mouth daily.        . furosemide (LASIX) 20 MG tablet Take 1 tablet (20 mg total) by mouth every other day.  30 tablet  0  . lidocaine-prilocaine (EMLA) cream Apply 1 application topically as needed. Apply to port-a-cath site 1-2 hours prior to treatment.      Marland Kitchen loratadine (CLARITIN) 10 MG tablet Take 10 mg by mouth daily.      . magnesium gluconate (MAGONATE) 500 MG tablet Take 500 mg by mouth daily.      . Multiple Vitamins-Minerals (MULTIVITAMIN WITH MINERALS) tablet Take 1 tablet by mouth daily.       Marland Kitchen nystatin ointment (MYCOSTATIN) Apply 1 application topically 2 (two) times daily.      . ondansetron (ZOFRAN) 8 MG tablet Take 8 mg by mouth 2 (two) times daily. Take two times a day starting the day  after chemo for 3 days. Then take two times a day as needed for nausea or vomiting.      Marland Kitchen oxyCODONE-acetaminophen (PERCOCET) 10-325 MG per tablet Take 1 tablet by mouth every 4 (four) hours as needed for pain.  30 tablet  0  . PACLitaxel (TAXOL IV) Inject 1 each into the vein every 21 ( twenty-one) days.      . pantoprazole (PROTONIX) 40 MG tablet Take 40 mg by mouth daily.      . pegfilgrastim (NEULASTA) 6 MG/0.6ML injection Inject 6 mg into the skin every 21 ( twenty-one) days.      . pegfilgrastim (NEULASTA) 6 MG/0.6ML injection Inject 0.6 mLs (6 mg total) into the skin once. On day  after each chemotherapy  1 Syringe  1  . PRESCRIPTION MEDICATION every 21 ( twenty-one) days. CHEMOTHERAPY REGIMEN      . prochlorperazine (COMPAZINE) 10 MG tablet Take 10 mg by mouth every 6 (six) hours as needed (Nausea or vomiting).      . promethazine (PHENERGAN) 25 MG tablet Take 12.5 mg by mouth every 6 (six) hours as needed for nausea or vomiting.      . sucralfate (CARAFATE) 1 GM/10ML suspension Take 10 mLs (1 g total) by mouth 4 (four) times daily.  420 mL  2  . UNABLE TO FIND Heavy duty rolling walker  Diagnosis: Endometrial cancer 182.0; Ascites 789.59; severe fatigue 780.79  1 Units  0  . UNABLE TO FIND Super heavy duty wheelchair with anit-tippers and leg rests with heel loops  Diagnosis: Endometrial cancer 182.0; Ascites 789.59; severe fatigue 780.79, thoracic compression fractures 733.13  1 Units  0  . miconazole nitrate (MICATIN) POWD Apply 1 application topically 4 (four) times daily.  10 g  2  . potassium chloride (K-DUR) 10 MEQ tablet Take 2 tablets (20 mEq total) by mouth daily.  14 tablet  0   No current facility-administered medications for this visit.      REVIEW OF SYSTEMS: A 10 point review of systems was completed and is negative except as noted above.   PHYSICAL EXAMINATION: BP 111/73  Pulse 108  Temp(Src) 97.7 F (36.5 C) (Oral)  Resp 20  Ht 5\' 11"  (1.803 m)  Wt 424 lb 8 oz  (192.552 kg)  BMI 59.23 kg/m2  General: Patient is a well appearing female in no acute distress HEENT: PERRLA, sclerae anicteric no conjunctival pallor, MMM Neck: supple, no palpable adenopathy Lungs: clear to auscultation bilaterally, no wheezes, rhonchi, or rales Cardiovascular: regular rate rhythm, S1, S2, no murmurs, rubs or gallops Abdomen: Soft, non-tender, non-distended, normoactive bowel sounds, no HSM, limited exam patient morbidly obese Extremities: warm and well perfused, no clubbing, cyanosis, or edema, strength 5/5 all extremities Skin: erythema and fungal appearing rash under right breast, it is moist with cream Neuro: Non-focal ECOG FS:  3 - Symptomatic, >50% confined to bed   LAB RESULTS: Lab Results  Component Value Date   WBC 2.3* 04/07/2013   NEUTROABS 1.4* 04/07/2013   HGB 9.6* 04/07/2013   HCT 29.8* 04/07/2013   MCV 107.4* 04/07/2013   PLT 145 04/07/2013      Chemistry      Component Value Date/Time   NA 139 04/07/2013 1108   NA 141 03/16/2013 0309   K 3.4* 04/07/2013 1108   K 3.7 03/16/2013 0309   CL 105 03/16/2013 0309   CL 98 10/07/2012 0923   CO2 29 04/07/2013 1108   CO2 26 03/16/2013 0309   BUN 6.6* 04/07/2013 1108   BUN 14 03/16/2013 0309   CREATININE 0.6 04/07/2013 1108   CREATININE 0.75 03/16/2013 0309      Component Value Date/Time   CALCIUM 9.2 04/07/2013 1108   CALCIUM 8.6 03/16/2013 0309   ALKPHOS 154* 04/07/2013 1108   ALKPHOS 179* 03/12/2013 0845   AST 48* 04/07/2013 1108   AST 42* 03/12/2013 0845   ALT 39 04/07/2013 1108   ALT 53* 03/12/2013 0845   BILITOT 0.54 04/07/2013 1108   BILITOT 0.5 03/12/2013 0845       No results found for this basename: [id number]    No components found with this basename: [id number]     RADIOGRAPHIC STUDIES: Ir Vertebroplasty Or Sacroplasty 03/02/2013   CLINICAL  DATA:  Patient with severely painful compression fracture at T10 and T11. History of endometrial carcinoma. On chemotherapy.  EXAM:  VERTEBROPLASTY FL AT T10 and T11.  CORE BIOPSIES OBTAINED  MEDICATIONS: Versed 4 mg. mg IV, Fentanyl 100  mcg IV.  ANESTHESIA/SEDATION: Total Moderate Sedation Time:  35 min.  FLUOROSCOPY TIME:  8 min 48 seconds. Marland Kitchen  PROCEDURE: Following a full explanation of the procedure along with the potentially associated complications, a witnessed informed consent was obtained.  The patient was placed prone on the fluoroscopic table. Nasal oxygen was administered. Physiologic monitoring was performed throughout the duration of the procedure. The skin overlying the thoracic region was prepped and draped in the usual sterile fashion. The T10 and T11 vertebral bodies were identified and the right pedicle at T10, and the left pedicle at T11 were infiltrated with 0.25% Bupivacaine. This was then followed by the advancement of a 13-gauge Cook needle through both the pedicles into the anterior one-third at both levels. Prior to this, 18 gauge core biopsy needles were used to obtain 4 samples of both the levels using a 20 mL syringe. These were sent for pathologic analysis. A gentle contrast injection demonstrated a trabecular pattern of contrast.  At this time, methylmethacrylate mixture was reconstituted. Under biplane intermittent fluoroscopy, the methylmethacrylate was then injected into the T10 and T11 vertebral body with filling of the vertebral body at T10 and T12.  Mild extravasation was noted into the disk spaces through the inferior endplate fracture clefts . No epidural venous contamination was seen.  The needles were then removed. Hemostasis was achieved at the skin entry site.  There were no acute complications. Patient tolerated the procedure well. The patient was observed for 3 hours and discharged in good condition.  IMPRESSION: Status post vertebral body augmentation for painful compression fracture at T10 and T11 using vertebroplasty technique.  Core biopsies obtained at T10 and T11. Sample sent for pathologic  analysis.   Electronically Signed   By: Julieanne Cotton M.D.   On: 02/26/2013 13:10      ASSESSMENT: [name] is a 47 y.o. woman:  #1 Recurrent uterine cancer with peritoneal carcinomatosis and ascites.  Port a cath was placed in the OR, and the patient has been treated in the palliative setting with Taxol and Carboplatinum that started on 11/04/2012.    #2 Ascites: History of having paracenteses 1-2 times per week, she hasn't received/required a paracentesis since 11/24/2012.  #3 Back pain - Compression fractures at T10 and T11 per MRI on 02/05/2013.  Status post kyphoplasty on 02/26/2013.  Patient with repeat MRI on 12/4 demonstrating compression fractures at L1 and L2.     PLAN: #1  Patient is doing relatively well considering everything going on.  Her CBC is stable after chemotherapy.  I reviewed it with her in detail. We discussed her plan of care today.  She will undergo her eighth cycle of chemotherapy on 12/31 and have follow up CT chest abdomen pelvis on 05/04/13.    #2 She has a moist fungal rash underneath her right breast.  I prescribed micatin powder to help dry it up and treat it.  Should her breast erythema increase, her mother will call me, I will prescribe Keflex.    #3 I referred her back to IR for kyphoplasty with Dr. Kyung Rudd.  She was seen by Dr. Newell Coral who determined that surgery was not in her best interest.    #4 She will return on 04/22/13 for labs, evaluation,  and cycle 8 of chemotherapy.  I will see her in the treatment room on this day.    All questions were answered. The patient and her family were encouraged to contact us in the interim with any problems, questions or concerns.   I spent 25 minutes counseling the patient face to face.  The total time spent in the appointment was 30 minutes.  Catherine Level, NP Medical Oncology [facility] [phone]  04/08/2013  9:48 AM

## 2013-04-07 NOTE — Telephone Encounter (Signed)
Per staff message and POF I have scheduled appts.  JMW  

## 2013-04-07 NOTE — Telephone Encounter (Signed)
s.w. pt and advised on DEc and Jan 2015 appt....lvm for Dr. Ruthy Dick to sched appt...pt ok and awre...MW added tx.

## 2013-04-08 ENCOUNTER — Encounter: Payer: Self-pay | Admitting: Adult Health

## 2013-04-09 ENCOUNTER — Ambulatory Visit: Payer: BC Managed Care – PPO | Admitting: Gynecologic Oncology

## 2013-04-10 ENCOUNTER — Other Ambulatory Visit: Payer: Self-pay | Admitting: *Deleted

## 2013-04-10 ENCOUNTER — Telehealth: Payer: Self-pay | Admitting: *Deleted

## 2013-04-10 DIAGNOSIS — C541 Malignant neoplasm of endometrium: Secondary | ICD-10-CM

## 2013-04-10 MED ORDER — CEPHALEXIN 500 MG PO CAPS: 500.0000 mg | ORAL_CAPSULE | Freq: Four times a day (QID) | ORAL | Status: DC

## 2013-04-10 NOTE — Telephone Encounter (Signed)
Spoke with mother Tyler Aas and informed her that Keflex had been called in to pt's pharmacy, and pt needs to start med today.  Informed Doris also that pt can have doppler study to evaluate for blood clot from right arm swelling.  Per pt,  She did not want doppler study done now;  She would rather start antibiotics first.  Asked pt to call nurse back if she changed her mind early this am about doppler- not wait until later in the day.  Mother understood to take pt to ER if swelling worsens this pm or over the weekend.

## 2013-04-10 NOTE — Telephone Encounter (Signed)
Received call from mother Nicole Cella informing nurse re:  Pt's Right arm is swollen more since Tuesday;  Redness under Right breast worsened.  Informed Dorothy that message will be relayed to Lillia Abed, NP for further instructions.  Mother voiced understanding. Dorothy's phone   [phone].

## 2013-04-13 ENCOUNTER — Telehealth: Payer: Self-pay | Admitting: Emergency Medicine

## 2013-04-13 ENCOUNTER — Encounter (HOSPITAL_COMMUNITY): Payer: Self-pay | Admitting: Emergency Medicine

## 2013-04-13 ENCOUNTER — Inpatient Hospital Stay (HOSPITAL_COMMUNITY)
Admission: EM | Admit: 2013-04-13 | Discharge: 2013-04-17 | DRG: 314 | Disposition: A | Discharge status: Home Health | Payer: BC Managed Care – PPO | Attending: Internal Medicine | Admitting: Internal Medicine

## 2013-04-13 DIAGNOSIS — M79609 Pain in unspecified limb: Secondary | ICD-10-CM

## 2013-04-13 DIAGNOSIS — B372 Candidiasis of skin and nail: Secondary | ICD-10-CM | POA: Diagnosis present

## 2013-04-13 DIAGNOSIS — Z8249 Family history of ischemic heart disease and other diseases of the circulatory system: Secondary | ICD-10-CM

## 2013-04-13 DIAGNOSIS — Z808 Family history of malignant neoplasm of other organs or systems: Secondary | ICD-10-CM

## 2013-04-13 DIAGNOSIS — Z7401 Bed confinement status: Secondary | ICD-10-CM

## 2013-04-13 DIAGNOSIS — T502X5A Adverse effect of carbonic-anhydrase inhibitors, benzothiadiazides and other diuretics, initial encounter: Secondary | ICD-10-CM | POA: Diagnosis present

## 2013-04-13 DIAGNOSIS — C786 Secondary malignant neoplasm of retroperitoneum and peritoneum: Secondary | ICD-10-CM | POA: Diagnosis present

## 2013-04-13 DIAGNOSIS — I82622 Acute embolism and thrombosis of deep veins of left upper extremity: Secondary | ICD-10-CM

## 2013-04-13 DIAGNOSIS — C549 Malignant neoplasm of corpus uteri, unspecified: Secondary | ICD-10-CM | POA: Diagnosis present

## 2013-04-13 DIAGNOSIS — D509 Iron deficiency anemia, unspecified: Secondary | ICD-10-CM

## 2013-04-13 DIAGNOSIS — Z79899 Other long term (current) drug therapy: Secondary | ICD-10-CM

## 2013-04-13 DIAGNOSIS — I82619 Acute embolism and thrombosis of superficial veins of unspecified upper extremity: Secondary | ICD-10-CM | POA: Diagnosis present

## 2013-04-13 DIAGNOSIS — T82898A Other specified complication of vascular prosthetic devices, implants and grafts, initial encounter: Principal | ICD-10-CM | POA: Diagnosis present

## 2013-04-13 DIAGNOSIS — D539 Nutritional anemia, unspecified: Secondary | ICD-10-CM

## 2013-04-13 DIAGNOSIS — I82629 Acute embolism and thrombosis of deep veins of unspecified upper extremity: Secondary | ICD-10-CM | POA: Diagnosis present

## 2013-04-13 DIAGNOSIS — I82B19 Acute embolism and thrombosis of unspecified subclavian vein: Secondary | ICD-10-CM | POA: Diagnosis present

## 2013-04-13 DIAGNOSIS — IMO0002 Reserved for concepts with insufficient information to code with codable children: Secondary | ICD-10-CM | POA: Diagnosis present

## 2013-04-13 DIAGNOSIS — G4733 Obstructive sleep apnea (adult) (pediatric): Secondary | ICD-10-CM | POA: Diagnosis present

## 2013-04-13 DIAGNOSIS — S32020A Wedge compression fracture of second lumbar vertebra, initial encounter for closed fracture: Secondary | ICD-10-CM

## 2013-04-13 DIAGNOSIS — Z6841 Body Mass Index (BMI) 40.0 and over, adult: Secondary | ICD-10-CM

## 2013-04-13 DIAGNOSIS — E876 Hypokalemia: Secondary | ICD-10-CM | POA: Diagnosis present

## 2013-04-13 DIAGNOSIS — J9600 Acute respiratory failure, unspecified whether with hypoxia or hypercapnia: Secondary | ICD-10-CM | POA: Diagnosis present

## 2013-04-13 DIAGNOSIS — D6481 Anemia due to antineoplastic chemotherapy: Secondary | ICD-10-CM | POA: Diagnosis present

## 2013-04-13 DIAGNOSIS — I82A19 Acute embolism and thrombosis of unspecified axillary vein: Secondary | ICD-10-CM | POA: Diagnosis present

## 2013-04-13 DIAGNOSIS — T451X5A Adverse effect of antineoplastic and immunosuppressive drugs, initial encounter: Secondary | ICD-10-CM | POA: Diagnosis present

## 2013-04-13 DIAGNOSIS — I872 Venous insufficiency (chronic) (peripheral): Secondary | ICD-10-CM | POA: Diagnosis present

## 2013-04-13 DIAGNOSIS — Z452 Encounter for adjustment and management of vascular access device: Secondary | ICD-10-CM

## 2013-04-13 DIAGNOSIS — E669 Obesity, unspecified: Secondary | ICD-10-CM

## 2013-04-13 DIAGNOSIS — Y849 Medical procedure, unspecified as the cause of abnormal reaction of the patient, or of later complication, without mention of misadventure at the time of the procedure: Secondary | ICD-10-CM | POA: Diagnosis present

## 2013-04-13 DIAGNOSIS — M7989 Other specified soft tissue disorders: Secondary | ICD-10-CM

## 2013-04-13 DIAGNOSIS — S32010A Wedge compression fracture of first lumbar vertebra, initial encounter for closed fracture: Secondary | ICD-10-CM

## 2013-04-13 DIAGNOSIS — M8448XA Pathological fracture, other site, initial encounter for fracture: Secondary | ICD-10-CM | POA: Diagnosis present

## 2013-04-13 DIAGNOSIS — I890 Lymphedema, not elsewhere classified: Secondary | ICD-10-CM | POA: Diagnosis present

## 2013-04-13 DIAGNOSIS — Z833 Family history of diabetes mellitus: Secondary | ICD-10-CM

## 2013-04-13 DIAGNOSIS — K625 Hemorrhage of anus and rectum: Secondary | ICD-10-CM | POA: Diagnosis present

## 2013-04-13 DIAGNOSIS — I82621 Acute embolism and thrombosis of deep veins of right upper extremity: Secondary | ICD-10-CM

## 2013-04-13 DIAGNOSIS — C541 Malignant neoplasm of endometrium: Secondary | ICD-10-CM

## 2013-04-13 DIAGNOSIS — Z803 Family history of malignant neoplasm of breast: Secondary | ICD-10-CM

## 2013-04-13 LAB — URINALYSIS W MICROSCOPIC + REFLEX CULTURE
Ketones, ur: NEGATIVE mg/dL
Nitrite: NEGATIVE
Protein, ur: 30 mg/dL — AB

## 2013-04-13 LAB — CBC WITH DIFFERENTIAL/PLATELET
Basophils Absolute: 0 10*3/uL (ref 0.0–0.1)
Basophils Relative: 0 % (ref 0–1)
Eosinophils Absolute: 0.1 10*3/uL (ref 0.0–0.7)
Eosinophils Relative: 1 % (ref 0–5)
HCT: 31.3 % — ABNORMAL LOW (ref 36.0–46.0)
MCHC: 30.4 g/dL (ref 30.0–36.0)
MCV: 108.7 fL — ABNORMAL HIGH (ref 78.0–100.0)
Monocytes Absolute: 0.7 10*3/uL (ref 0.1–1.0)
Monocytes Relative: 14 % — ABNORMAL HIGH (ref 3–12)
Neutro Abs: 2.9 10*3/uL (ref 1.7–7.7)
RDW: 19 % — ABNORMAL HIGH (ref 11.5–15.5)
WBC: 4.6 10*3/uL (ref 4.0–10.5)

## 2013-04-13 LAB — BASIC METABOLIC PANEL
Calcium: 8.9 mg/dL (ref 8.4–10.5)
Creatinine, Ser: 0.61 mg/dL (ref 0.50–1.10)
GFR calc Af Amer: >90 mL/min (ref >90)
GFR calc non Af Amer: >90 mL/min (ref >90)

## 2013-04-13 LAB — PROTIME-INR
INR: 0.99 (ref 0.00–1.49)
Prothrombin Time: 12.9 seconds (ref 11.6–15.2)

## 2013-04-13 LAB — APTT: aPTT: 20 seconds — ABNORMAL LOW (ref 24–37)

## 2013-04-13 MED ORDER — OXYCODONE-ACETAMINOPHEN 5-325 MG PO TABS
1.0000 | ORAL_TABLET | ORAL | Status: DC | PRN
  Administered 2013-04-13 – 2013-04-16 (×4): 1 via ORAL
  Filled 2013-04-13 (×4): qty 1

## 2013-04-13 MED ORDER — CYCLOBENZAPRINE HCL 5 MG PO TABS
5.0000 mg | ORAL_TABLET | Freq: Three times a day (TID) | ORAL | Status: DC | PRN
  Filled 2013-04-13: qty 1

## 2013-04-13 MED ORDER — DM-GUAIFENESIN ER 30-600 MG PO TB12
1.0000 | ORAL_TABLET | Freq: Two times a day (BID) | ORAL | Status: DC
  Administered 2013-04-13 – 2013-04-17 (×7): 1 via ORAL
  Filled 2013-04-13 (×9): qty 1

## 2013-04-13 MED ORDER — NYSTATIN 100000 UNIT/GM EX OINT
1.0000 "application " | TOPICAL_OINTMENT | Freq: Two times a day (BID) | CUTANEOUS | Status: DC
  Administered 2013-04-13 – 2013-04-17 (×6): 1 via TOPICAL
  Filled 2013-04-13: qty 15

## 2013-04-13 MED ORDER — HEPARIN BOLUS VIA INFUSION
3500.0000 [IU] | Freq: Once | INTRAVENOUS | Status: AC
  Administered 2013-04-13: 3500 [IU] via INTRAVENOUS
  Filled 2013-04-13: qty 3500

## 2013-04-13 MED ORDER — PROMETHAZINE HCL 25 MG PO TABS: 12.5000 mg | ORAL_TABLET | Freq: Four times a day (QID) | ORAL | Status: DC | PRN

## 2013-04-13 MED ORDER — SODIUM CHLORIDE 0.9 % IJ SOLN
3.0000 mL | Freq: Two times a day (BID) | INTRAMUSCULAR | Status: DC
  Administered 2013-04-15: 10:00:00 via INTRAVENOUS

## 2013-04-13 MED ORDER — SUCRALFATE 1 GM/10ML PO SUSP
1.0000 g | Freq: Four times a day (QID) | ORAL | Status: DC
  Administered 2013-04-13 – 2013-04-17 (×13): 1 g via ORAL
  Filled 2013-04-13 (×18): qty 10

## 2013-04-13 MED ORDER — ACETAMINOPHEN 325 MG PO TABS: 650.0000 mg | ORAL_TABLET | Freq: Four times a day (QID) | ORAL | Status: DC | PRN

## 2013-04-13 MED ORDER — FUROSEMIDE 20 MG PO TABS
20.0000 mg | ORAL_TABLET | ORAL | Status: DC
  Administered 2013-04-16: 20 mg via ORAL
  Filled 2013-04-13 (×2): qty 1

## 2013-04-13 MED ORDER — ADULT MULTIVITAMIN W/MINERALS CH
1.0000 | ORAL_TABLET | Freq: Every day | ORAL | Status: DC
  Administered 2013-04-15 – 2013-04-17 (×3): 1 via ORAL
  Filled 2013-04-13 (×4): qty 1

## 2013-04-13 MED ORDER — MICONAZOLE NITRATE POWD
1.0000 "application " | Freq: Four times a day (QID) | Status: DC
  Administered 2013-04-13 – 2013-04-17 (×8): 1 via TOPICAL

## 2013-04-13 MED ORDER — HEPARIN BOLUS VIA INFUSION
4000.0000 [IU] | Freq: Once | INTRAVENOUS | Status: AC
  Administered 2013-04-13: 4000 [IU] via INTRAVENOUS
  Filled 2013-04-13: qty 4000

## 2013-04-13 MED ORDER — OXYCODONE HCL 5 MG PO TABS
5.0000 mg | ORAL_TABLET | ORAL | Status: DC | PRN
  Administered 2013-04-13 – 2013-04-16 (×5): 5 mg via ORAL
  Filled 2013-04-13 (×5): qty 1

## 2013-04-13 MED ORDER — MULTI-VITAMIN/MINERALS PO TABS: 1.0000 | ORAL_TABLET | Freq: Every day | ORAL | Status: DC

## 2013-04-13 MED ORDER — ENOXAPARIN (LOVENOX) PATIENT EDUCATION KIT
PACK | Freq: Once | Status: DC
  Filled 2013-04-13: qty 1

## 2013-04-13 MED ORDER — PANTOPRAZOLE SODIUM 40 MG PO TBEC
40.0000 mg | DELAYED_RELEASE_TABLET | Freq: Every day | ORAL | Status: DC
  Administered 2013-04-15 – 2013-04-17 (×3): 40 mg via ORAL
  Filled 2013-04-13 (×4): qty 1

## 2013-04-13 MED ORDER — POTASSIUM CHLORIDE ER 10 MEQ PO TBCR
20.0000 meq | EXTENDED_RELEASE_TABLET | Freq: Every day | ORAL | Status: DC
  Filled 2013-04-13 (×2): qty 2

## 2013-04-13 MED ORDER — FERROUS SULFATE 325 (65 FE) MG PO TABS
325.0000 mg | ORAL_TABLET | Freq: Three times a day (TID) | ORAL | Status: DC
  Administered 2013-04-14 – 2013-04-17 (×9): 325 mg via ORAL
  Filled 2013-04-13 (×14): qty 1

## 2013-04-13 MED ORDER — OXYCODONE-ACETAMINOPHEN 10-325 MG PO TABS: 1.0000 | ORAL_TABLET | ORAL | Status: DC | PRN

## 2013-04-13 MED ORDER — HEPARIN (PORCINE) IN NACL 100-0.45 UNIT/ML-% IJ SOLN
2900.0000 [IU]/h | INTRAMUSCULAR | Status: AC
  Administered 2013-04-13 (×2): 2000 [IU]/h via INTRAVENOUS
  Administered 2013-04-13: 2350 [IU]/h via INTRAVENOUS
  Administered 2013-04-14: 2900 [IU]/h via INTRAVENOUS
  Filled 2013-04-13 (×3): qty 250

## 2013-04-13 NOTE — Progress Notes (Addendum)
Patient received from ED, moved to a bariatric bed.  Patient has IV in progress in left ac, a porta cath in right upper chest which we can not use, right upper arm/axilla read and appears slightly edematous and attempted to elevate on pillow.  Patient placed on telemetry box #59, verified with central telemetry.  Skin under breast and gluteal folds damp with rash, has been using anti fungal powders at home. Bilateral ankles are discolored.  States she has a wheelchair, walker and bedside commode at home and gets from bed to bsc with one assist.  [facility] and an RN comes to check on her once a week.  Patient states that she is familiar with lovenox and how to administer to herself due to past dvt home therapy with lovenox.

## 2013-04-13 NOTE — Progress Notes (Signed)
ANTICOAGULATION CONSULT NOTE - Initial Consult  Pharmacy Consult for IV heparin Indication: DVT  No Known Allergies  Patient Measurements: Height: 5' 10.87" (180 cm) Weight: 424 lb 9.7 oz (192.6 kg) IBW/kg (Calculated) : 70.49 Heparin Dosing Weight: 120 kg   Vital Signs: Temp: 98.5 F (36.9 C) (12/22 2131) Temp src: Oral (12/22 2131) BP: 100/53 mmHg (12/22 2131) Pulse Rate: 95 (12/22 2131)  Labs:  Recent Labs  04/13/13 1225 04/13/13 1430 04/13/13 2108  HGB 9.5*  --   --   HCT 31.3*  --   --   PLT 166  --   --   APTT  --  20*  --   LABPROT  --  12.9  --   INR  --  0.99  --   HEPARINUNFRC  --   --  0.12*  CREATININE 0.61  --   --     Estimated Creatinine Clearance: 167.2 ml/min (by C-G formula based on Cr of 0.61).   Medical History: Past Medical History  Diagnosis Date  . Obesity   . DVT (deep venous thrombosis) 2009    RLE, tx for ~6 months  . PONV (postoperative nausea and vomiting)   . Swelling     BOTH LEGS  . Rash     LOWER ABD  . Gallstones   . Ascites   . Uterine cancer     Dr. Welton Flakes, chemotherapy  . Family history of anesthesia complication     MOTHER IS DIFFICULT TO WAKE  . Gallstones   . Cellulitis of leg, right     Assessment: 34 yof with h/o stage IA uterine cancer currently undergoing chemotherapy (C7 Carbo/Taxol given 12/10) presented 12/22 with c/o R arm and R breast swelling. RUE venous duplex found DVT in the R subclavian, axillary and brachial veins to start IV heparin. Hgb 9.5, plts 166K, ANC 2.9K.  Baselin aPTT/INR WNL  First heparin level 0.12 is subtherapeutic   Goal of Therapy:  Heparin level 0.3-0.7 units/ml Monitor platelets by anticoagulation protocol: Yes   Plan:   Rebolus patient with 3500 units Heparin, then increase gtt to 2350 units/hr  Daily heparin level and CBC  HL at 0400   Raelle Chambers, Loma Messing PharmD Pager #: [phone] 9:50 PM 04/13/2013

## 2013-04-13 NOTE — ED Notes (Signed)
IV team responded they are on their way

## 2013-04-13 NOTE — Progress Notes (Signed)
ANTICOAGULATION CONSULT NOTE - Initial Consult  Pharmacy Consult for IV heparin Indication: DVT  No Known Allergies  Patient Measurements: Height: 5' 10.87" (180 cm) Weight: 424 lb 9.7 oz (192.6 kg) IBW/kg (Calculated) : 70.49 Heparin Dosing Weight: 120 kg  Vital Signs: Temp: 97.8 F (36.6 C) (12/22 1130) Temp src: Oral (12/22 1130) Pulse Rate: 102 (12/22 1130)  Labs:  Recent Labs  04/13/13 1225  HGB 9.5*  HCT 31.3*  PLT 166  CREATININE 0.61    Estimated Creatinine Clearance: 167.2 ml/min (by C-G formula based on Cr of 0.61).   Medical History: Past Medical History  Diagnosis Date  . Obesity   . DVT (deep venous thrombosis) 2009  . PONV (postoperative nausea and vomiting)   . Swelling     BOTH LEGS  . Rash     LOWER ABD  . Gallstones   . Ascites   . Uterine cancer   . Family history of anesthesia complication     MOTHER IS DIFFICULT TO WAKE  . Gallstones   . Cellulitis of leg, right     Assessment: 52 yof with h/o stage IA uterine cancer currently undergoing chemotherapy (C7 Carbo/Taxol given 12/10) presented 12/22 with c/o R arm and R breast swelling. RUE venous duplex found DVT in the R subclavian, axillary and brachial veins to start IV heparin. Hgb 9.5, plts 166K, ANC 2.9K   Goal of Therapy:  Heparin level 0.3-0.7 units/ml Monitor platelets by anticoagulation protocol: Yes   Plan:   STAT PT/INR, APTT  IV heparin 4000 units x 1 as bolus then 2000 units/hr  Heparin level 6 hours after initiation  Daily heparin level and CBC  Geoffry Paradise, PharmD, BCPS Pager: [phone] 1:59 PM Pharmacy #: [id number]

## 2013-04-13 NOTE — ED Provider Notes (Signed)
CSN: [id number]     Arrival date & time 04/13/13  1138 History   First MD Initiated Contact with Patient 04/13/13 1146     Chief Complaint  Patient presents with  . Arm Swelling    HPI Pt was seen at 1145. Per pt and her family, c/o gradual onset and worsening of RUE "swelling" for the past 6 days. Pt was evaluated by her Heme/Onc MD 6 days ago for same, and was requested to obtain an outpatient Vasc US. Pt did not have the study completed, but came to the ED today for evaluation. Denies fevers, no tingling/numbness in extremity, no focal motor weakness, no injury.     Past Medical History  Diagnosis Date  . Obesity   . DVT (deep venous thrombosis) 2009  . PONV (postoperative nausea and vomiting)   . Swelling     BOTH LEGS  . Rash     LOWER ABD  . Gallstones   . Ascites   . Uterine cancer   . Family history of anesthesia complication     MOTHER IS DIFFICULT TO WAKE  . Gallstones   . Cellulitis of leg, right    Past Surgical History  Procedure Laterality Date  . Ankle surgery  1950  . Wisdom tooth extraction    . Tonsillectomy and adenoidectomy    . Hysteroscopy w/d&c    . Abdominal hysterectomy  12/2009    [facility], [facility]  . Laparoscopy N/A 10/02/2012    Procedure: LAPAROSCOPY DIAGNOSTIC, perocentisis, omental biopsy;  Surgeon: Wilmon Arms. Tsuei, MD;  Location: [facility];  Service: General;  Laterality: N/A;  removed a total of 15,000 of acities  . Paracentesis    . Kyphoplasty  02/26/2013   Family History  Problem Relation Age of Onset  . Heart disease Father   . Diabetes Brother   . Heart disease Mother    History  Substance Use Topics  . Smoking status: Never Smoker   . Smokeless tobacco: Never Used  . Alcohol Use: Yes     Comment: social, Occassionally    Review of Systems ROS: Statement: All systems negative except as marked or noted in the HPI; Constitutional: Negative for fever and chills. ; ; Eyes: Negative for eye pain, redness and discharge. ; ; ENMT: Negative for  ear pain, hoarseness, nasal congestion, sinus pressure and sore throat. ; ; Cardiovascular: Negative for chest pain, palpitations, diaphoresis, dyspnea and peripheral edema. ; ; Respiratory: Negative for cough, wheezing and stridor. ; ; Gastrointestinal: Negative for nausea, vomiting, diarrhea, abdominal pain, blood in stool, hematemesis, jaundice and rectal bleeding. ; ; Genitourinary: Negative for dysuria, flank pain and hematuria. ; ; Musculoskeletal: +RUE swelling. Negative for back pain and neck pain. Negative for deformity and trauma.; ; Skin: Negative for pruritus, rash, abrasions, blisters, bruising and skin lesion.; ; Neuro: Negative for headache, lightheadedness and neck stiffness. Negative for weakness, altered level of consciousness , altered mental status, extremity weakness, paresthesias, involuntary movement, seizure and syncope.      Allergies  Review of patient's allergies indicates no known allergies.  Home Medications   Current Outpatient Rx  Name  Route  Sig  Dispense  Refill  . Ascorbic Acid (VITAMIN C) 1000 MG tablet   Oral   Take 1,000 mg by mouth daily.         Marland Kitchen b complex vitamins capsule   Oral   Take 1 capsule by mouth daily.         . cephALEXin (KEFLEX) 500  MG capsule   Oral   Take 1 capsule (500 mg total) by mouth 4 (four) times daily.   56 capsule   0   . Cyanocobalamin (VITAMIN B-12 PO)   Oral   Take 2,500 mg by mouth daily.          . cyclobenzaprine (FLEXERIL) 5 MG tablet   Oral   Take 5 mg by mouth 3 (three) times daily as needed for muscle spasms.         Marland Kitchen dextromethorphan-guaiFENesin (MUCINEX DM) 30-600 MG per 12 hr tablet   Oral   Take 1 tablet by mouth 2 (two) times daily.         . ferrous sulfate 325 (65 FE) MG tablet   Oral   Take 1 tablet (325 mg total) by mouth 3 (three) times daily with meals.   90 tablet   2   . fluconazole (DIFLUCAN) 200 MG tablet   Oral   Take 1 tablet (200 mg total) by mouth daily.   7 tablet    0   . folic acid (FOLVITE) 400 MCG tablet   Oral   Take 400 mcg by mouth daily.           . furosemide (LASIX) 20 MG tablet   Oral   Take 1 tablet (20 mg total) by mouth every other day.   30 tablet   0   . lidocaine-prilocaine (EMLA) cream   Topical   Apply 1 application topically as needed. Apply to port-a-cath site 1-2 hours prior to treatment.         Marland Kitchen loratadine (CLARITIN) 10 MG tablet   Oral   Take 10 mg by mouth daily.         . magnesium gluconate (MAGONATE) 500 MG tablet   Oral   Take 500 mg by mouth daily.         . miconazole nitrate (MICATIN) POWD   Topical   Apply 1 application topically 4 (four) times daily.   10 g   2   . Multiple Vitamins-Minerals (MULTIVITAMIN WITH MINERALS) tablet   Oral   Take 1 tablet by mouth daily.          Marland Kitchen nystatin ointment (MYCOSTATIN)   Topical   Apply 1 application topically 2 (two) times daily.         . ondansetron (ZOFRAN) 8 MG tablet   Oral   Take 8 mg by mouth 2 (two) times daily. Take two times a day starting the day after chemo for 3 days. Then take two times a day as needed for nausea or vomiting.         Marland Kitchen oxyCODONE-acetaminophen (PERCOCET) 10-325 MG per tablet   Oral   Take 1 tablet by mouth every 4 (four) hours as needed for pain.   30 tablet   0   . PACLitaxel (TAXOL IV)   Intravenous   Inject 1 each into the vein every 21 ( twenty-one) days.         . pantoprazole (PROTONIX) 40 MG tablet   Oral   Take 40 mg by mouth daily.         . pegfilgrastim (NEULASTA) 6 MG/0.6ML injection   Subcutaneous   Inject 6 mg into the skin every 21 ( twenty-one) days.         . potassium chloride (K-DUR) 10 MEQ tablet   Oral   Take 2 tablets (20 mEq total) by mouth daily.   14 tablet  0   . PRESCRIPTION MEDICATION      every 21 ( twenty-one) days. CHEMOTHERAPY REGIMEN         . prochlorperazine (COMPAZINE) 10 MG tablet   Oral   Take 10 mg by mouth every 6 (six) hours as needed (Nausea  or vomiting).         . promethazine (PHENERGAN) 25 MG tablet   Oral   Take 12.5 mg by mouth every 6 (six) hours as needed for nausea or vomiting.         . sucralfate (CARAFATE) 1 GM/10ML suspension   Oral   Take 10 mLs (1 g total) by mouth 4 (four) times daily.   420 mL   2     BP 112/72  Pulse 88  Temp(Src) 97.9 F (36.6 C) (Oral)  Resp 20  Ht 5' 10.87" (1.8 m)  Wt 424 lb 9.7 oz (192.6 kg)  BMI 59.44 kg/m2  SpO2 100%  Physical Exam  1150: Physical examination:  Nursing notes reviewed; Vital signs and O2 SAT reviewed;  Constitutional: Well developed, Well nourished, Well hydrated, In no acute distress; Head:  Normocephalic, atraumatic; Eyes: EOMI, PERRL, No scleral icterus; ENMT: Mouth and pharynx normal, Mucous membranes moist; Neck: Supple, Full range of motion, No lymphadenopathy; Cardiovascular: Regular rate and rhythm, No gallop; Respiratory: Breath sounds clear & equal bilaterally, No wheezes. Speaking full sentences with ease, Normal respiratory effort/excursion; Chest: Nontender, Movement normal. +fungal rash under right breast.; Abdomen: Soft, Nontender, Normal bowel sounds;; Extremities: Pulses normal, No tenderness, +edema to RUE shoulder to hand with faint erythema to right upper inner arm. No open wounds, no ecchymosis. Chronic stasis changes bilat LE's. No calf asymmetry.; Neuro: AA&Ox3, Major CN grossly intact.  Speech clear. No gross focal motor or sensory deficits in extremities.; Skin: Color normal, Warm, Dry.   ED Course  Procedures   EKG Interpretation   None       MDM  MDM Reviewed: previous chart, nursing note and vitals Reviewed previous: labs Interpretation: labs and ultrasound     Results for orders placed during the hospital encounter of 04/13/13  CBC WITH DIFFERENTIAL      Result Value Range   WBC 4.6  4.0 - 10.5 K/uL   RBC 2.88 (*) 3.87 - 5.11 MIL/uL   Hemoglobin 9.5 (*) 12.0 - 15.0 g/dL   HCT 16.1 (*) 09.6 - 04.5 %   MCV 108.7  (*) 78.0 - 100.0 fL   MCH 33.0  26.0 - 34.0 pg   MCHC 30.4  30.0 - 36.0 g/dL   RDW 40.9 (*) 81.1 - 91.4 %   Platelets 166  150 - 400 K/uL   Neutrophils Relative % 62  43 - 77 %   Neutro Abs 2.9  1.7 - 7.7 K/uL   Lymphocytes Relative 23  12 - 46 %   Lymphs Abs 1.1  0.7 - 4.0 K/uL   Monocytes Relative 14 (*) 3 - 12 %   Monocytes Absolute 0.7  0.1 - 1.0 K/uL   Eosinophils Relative 1  0 - 5 %   Eosinophils Absolute 0.1  0.0 - 0.7 K/uL   Basophils Relative 0  0 - 1 %   Basophils Absolute 0.0  0.0 - 0.1 K/uL  BASIC METABOLIC PANEL      Result Value Range   Sodium 138  135 - 145 mEq/L   Potassium 3.8  3.5 - 5.1 mEq/L   Chloride 99  96 - 112 mEq/L   CO2  29  19 - 32 mEq/L   Glucose, Bld 103 (*) 70 - 99 mg/dL   BUN 7  6 - 23 mg/dL   Creatinine, Ser 1.61  0.50 - 1.10 mg/dL   Calcium 8.9  8.4 - 09.6 mg/dL   GFR calc non Af Amer >90  >90 mL/min   GFR calc Af Amer >90  >90 mL/min    Progress Notes by Smiley Houseman, RVT at 04/13/2013 1:33 PM    Author: Smiley Houseman, RVT Service: Vascular Lab Author Type: Cardiovascular Sonographer   Filed: 04/13/2013 1:36 PM Note Time: 04/13/2013 1:33 PM Status: Signed   Editor: Smiley Houseman, RVT (Cardiovascular Sonographer)      Right upper extremity venous duplex: DVT noted in the right subclavian, axillary, and brachial veins. Superficial thrombus is noted in the right cephalic and basilic veins. Right ulnar and radial veins not visualized. Technically difficult study due to the patient's body habitus.     1340:  +DVT RUE, will start IV heparin. H/H per baseline. Dx and testing d/w pt and family.  Questions answered.  Verb understanding, agreeable to admit but are insistent they want Heme/Onc NP Mardella Layman to see pt while she is in the ED. T/C to Heme/Onc NP Mardella Layman , case discussed, including:  HPI, pertinent PM/SHx, VS/PE, dx testing, ED course and treatment:  Agreeable to consult, requests to admit to medicine service. T/C to [facility] Dr. Malachi Bonds, case  discussed, including:  HPI, pertinent PM/SHx, VS/PE, dx testing, ED course and treatment:  Agreeable to admit, requests to write temporary orders, obtain tele bed to team 8.   Laray Anger, DO 04/14/13 1115

## 2013-04-13 NOTE — ED Notes (Signed)
Bed: ZO10 Expected date:  Expected time:  Means of arrival:  Comments: EMS CA pt, arm swelling

## 2013-04-13 NOTE — Consult Note (Signed)
PAGE PUCCIARELLI   DOB:1965/10/07   ZO#:[id number]   JWJ#:[id number]  Subjective: [name] is a 47 year old female with prior history of stage IA uterine cancer diagnosed in 2010 with recurrent disease diagnosed in 2014.  Her oncologic history is as follows:   #1 Patient developed excessive uterine bleeding. She underwent dilatation and curettage with hysteroscopy. 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 Due to morbid obesity Mirena IUD was placed along with Aygestin. Patient had a dramatic directed weight loss with nutritional support she went from 523 pounds to 359 pounds in 10/2009. On 12/27/2009 she underwent a robotic-assisted laparoscopic hysterectomy, bilateral salpingo-oophorectomy, and mini laparotomy through the umbilical port with morcellation of uterus within about the delivery of the uterus. The final pathology revealed an endometrial adenocarcinoma grade 2 with invasion limited to 1 mm of the myometrium.   #2 Patient continued to do well until 09/2012 when she developed right upper quadrant pain. It was presumed to be cholelithiasis. 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 Omental biopsies were obtained. The pathology was consistent with metastatic adenocarcinoma with the presumption of recurrent endometrial carcinoma. She was seen by Dr. Laurette Schimke on 10/07/2012. Dr. Nelly Rout recommended that Ms. Duhe undergo chemotherapy consisting of Taxol and Carboplatinum. Chemotherapy started on 11/04/2012.   She does have back pain from compression fractures.  She has had a T10, T11 compression fracture that she underwent a kyphoplasty on 02/26/13 with Dr. Corliss Skains, and then unfortunately developed additional L1 and L2 compression fractures as seen on MRI on 12/4.  Dr. Newell Coral has evaluated the patient and has no surgical options for her  compression fractures and recommends kyphoplasty.   She has tolerated the chemotherapy relatively well.  She is cycle 7 day 14 of Taxol/Carbo.  The patient was evaluated last week following cycle 7 of chemotherapy.  She was c/o of a rash under her breast and that her right breast was reddened and that her arm was swollen I recommended a doppler which she declined.  She was prescribed Micatin powder and instructed to call if the redness got worse.  Her mother called [place] on Friday reporting swelling of the right arm had worsened along with the breast erythema.  I prescribed Keflex and again recommended a doppler.  She declined the doppler, the patient was instructed by the nurse to go to the emergency room if the redness or swelling worsened.  The patient's mother called [place] today requesting an urgent appointment because the swelling was tremendously worse.  We recommended she be evaluated in the emergency room.  The family requested I evaluate Ms. Fobes in the Emergency Room to discuss her plan.  She does have increased swelling in her right arm, and the rash underneath her right breast is improved with the micatin powder prescribed.  Otherwise, the patient and family do not have any further concerns.      Objective:  Filed Vitals:   04/13/13 1130  Pulse: 102  Temp: 97.8 F (36.6 C)  Resp: 19    Body mass index is 59.44 kg/(m^2). No intake or output data in the 24 hours ending 04/13/13 1431  Physical exam difficult due to patients morbidly obese body habitus.    Sclerae unicteric  Oropharynx clear  No peripheral adenopathy  Lungs clear -- no rales or rhonchi  Heart regular rate and rhythm  Abdomen benign  MSK no focal  spinal tenderness  Right upper extremity with extensive edema and  erythema, underneath right breast the fungal  infection has improved greatly.    Neuro nonfocal    CBG (last 3)  No results found for this basename: GLUCAP,  in the last 72 hours   Labs:  Lab Results   Component Value Date   WBC 4.6 04/13/2013   HGB 9.5* 04/13/2013   HCT 31.3* 04/13/2013   MCV 108.7* 04/13/2013   PLT 166 04/13/2013   NEUTROABS 2.9 04/13/2013    Urine Studies No results found for this basename: UACOL, UAPR, USPG, UPH, UTP, UGL, UKET, UBIL, UHGB, UNIT, UROB, ULEU, UEPI, UWBC, URBC, UBAC, CAST, CRYS, UCOM, BILUA,  in the last 72 hours  Basic Metabolic Panel:  Recent Labs Lab 04/07/13 1108 04/13/13 1225  NA 139 138  K 3.4* 3.8  CL  --  99  CO2 29 29  GLUCOSE 114 103*  BUN 6.6* 7  CREATININE 0.6 0.61  CALCIUM 9.2 8.9   GFR Estimated Creatinine Clearance: 167.2 ml/min (by C-G formula based on Cr of 0.61). Liver Function Tests:  Recent Labs Lab 04/07/13 1108  AST 48*  ALT 39  ALKPHOS 154*  BILITOT 0.54  PROT 6.4  ALBUMIN 2.8*   No results found for this basename: LIPASE, AMYLASE,  in the last 168 hours No results found for this basename: AMMONIA,  in the last 168 hours Coagulation profile No results found for this basename: INR, PROTIME,  in the last 168 hours  CBC:  Recent Labs Lab 04/07/13 1108 04/13/13 1225  WBC 2.3* 4.6  NEUTROABS 1.4* 2.9  HGB 9.6* 9.5*  HCT 29.8* 31.3*  MCV 107.4* 108.7*  PLT 145 166   Cardiac Enzymes: No results found for this basename: CKTOTAL, CKMB, CKMBINDEX, TROPONINI,  in the last 168 hours BNP: No components found with this basename: POCBNP,  CBG: No results found for this basename: GLUCAP,  in the last 168 hours D-Dimer No results found for this basename: DDIMER,  in the last 72 hours Hgb A1c No results found for this basename: HGBA1C,  in the last 72 hours Lipid Profile No results found for this basename: CHOL, HDL, LDLCALC, TRIG, CHOLHDL, LDLDIRECT,  in the last 72 hours Thyroid function studies No results found for this basename: TSH, T4TOTAL, FREET3, T3FREE, THYROIDAB,  in the last 72 hours Anemia work up No results found for this basename: VITAMINB12, FOLATE, FERRITIN, TIBC, IRON, RETICCTPCT,   in the last 72 hours Microbiology No results found for this or any previous visit (from the past 240 hour(s)).    Studies:  No results found.  Assessment/Plan: 47 y.o. female with   1.  Recurrent metastatic uterine cancer:  Patient is currently undergoing chemotherapy.  She is scheduled to receive cycle 8 on 12/31 as outpatient.  She will have repeat CT scans after cycle 8 of treatment.    2.  Right upper extremity DVT:  Patient will be started on Heparin gtt, and admitted to the hospital.  She and I discussed her eventually going on lovenox, she has already told me she will not take coumadin.  Dosing the Lovenox may present a challenge.  I will ask our pharmacy colleagues to help [place] with this.    3.  Right breast candida rash: she should continue micatin powder underneath the breast  4.  Patient needs kyphoplasty for L1 and L2 compression fractures.  If possible, would like for them to be done while in the hospital and  on heparin gtt.    5.  Pain: Patient on Percocet as needed and tolerating it well.  She takes it twice per day.    6.  Weakness/Debilitation: She is having difficulty with moving due to the pain from her compression fractures.  She needs a lot of help from her mother and I think would benefit from a PT/OT consult.    We will continue to follow along with you.    Illa Level, NP Medical Oncology [facility] [phone] Larna Daughters N 04/13/2013  ADDENDUM: I have discussed case with Dr Hermelinda Medicus. I think we should remove the port as it is likely a/the nidus for this patient's DVT, and removing it may speed up restoring drainage from her upper extremity and reduce her severe lymphedema. We can place a PICC later on if necessary for chemo access.  A second concern is lovenox absorption in this morbidly obese patient. She will need BID shots, but even so I think it would be prudent to document appropriate factor 10 activity suppression before  the patient is discharged home.  Will follow with you

## 2013-04-13 NOTE — H&P (Signed)
[facility] History and Physical  [name] ZOX:[id number] DOB: 03/29/1966 DOA: 04/13/2013  Referring physician:  Samuel Jester PCP:  [name], MD   Chief Complaint:  Right arm swelling  HPI:  The patient is a 47 y.o. year-old female with metastatic endometrial cancer and morbid obesity who presents with right upper extremity DVT.  Briefly, she was diagnosed with grade 1 endometrial adenoCA in 2010 and underwent D&C.  She lost some weight and was able to undergo TAH-BSO in 2011.  In 09/2012, she was found to have metastatic endometrial cancer during routine laparoscopy cholecystectomy.  Her gallbladder was not removed.  She has developed malignant ascites requiring intermittent paracentesis.  She has been undergoing chemotherapy with taxol and carboplatinum by Dr. Laurette Schimke and has a right IJ port which has been having problems with drawing blood for the last month.  Over the last week, [name] has developed progressive RUE pain, redness, and swelling that extends to the right lateral breast.  She was started on antibiotics which did not help.  Her family convinced her to come to the ER where US demonstrated DVT in the right subclavian, axillary, and brachial veins along with superficial thrombus in teh right cephalic and basilic veins.  She was started on heparin gtt in the ER and is being admitted for port removal and transition to lovenox for indefinite therapy.  VS notable for pulse 90-100, BP 99/50.  Labs notable for hgb 9.5, otherwise normal.    Review of Systems:  General:  Denies fevers, chills, weight loss or gain HEENT:  Denies changes to hearing and vision, rhinorrhea, sinus congestion, sore throat CV:  Denies chest pain and palpitations, lower extremity edema.  PULM:  Denies SOB, wheezing, cough.   GI:  Denies nausea, vomiting, constipation, diarrhea.   GU:  Denies dysuria, frequency, urgency ENDO:  Denies polyuria, polydipsia.   HEME:  Denies hematemesis,  melena, abnormal bruising or bleeding.  Had some hemorrhoidal bleeding late November, now resolved.  LYMPH:  Denies lymphadenopathy.   MSK:  RUE pain and swelling as above.  DERM:  Healing candidal dermatitis under breasts and groin   NEURO:  Denies focal numbness, weakness, slurred speech, confusion, facial droop.  PSYCH:  Denies anxiety and depression.    Past Medical History  Diagnosis Date  . Obesity   . DVT (deep venous thrombosis) 2009    RLE, tx for ~6 months  . PONV (postoperative nausea and vomiting)   . Swelling     BOTH LEGS  . Rash     LOWER ABD  . Gallstones   . Ascites   . Uterine cancer     Dr. Welton Flakes, chemotherapy  . Family history of anesthesia complication     MOTHER IS DIFFICULT TO WAKE  . Gallstones   . Cellulitis of leg, right    Past Surgical History  Procedure Laterality Date  . Ankle surgery  1990  . Wisdom tooth extraction    . Tonsillectomy and adenoidectomy    . Abdominal hysterectomy  12/2009    [facility], BSO  . Laparoscopy N/A 10/02/2012    Procedure: LAPAROSCOPY DIAGNOSTIC, perocentisis, omental biopsy;  Surgeon: Wilmon Arms. Tsuei, MD;  Location: [facility];  Service: General;  Laterality: N/A;  removed a total of 15,000 of acities  . Paracentesis    . Kyphoplasty  02/26/2013   Social History:  reports that she has never smoked. She has never used smokeless tobacco. She reports that she drinks alcohol. She reports  that she does not use illicit drugs. Lives with her parents.  Uses a walker to ambulate and wheelchair.  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    No Known Allergies  Family History  Problem Relation Age of Onset  . Heart disease Father   . Diabetes Brother   . Heart disease Mother   . Cirrhosis Father   . Diabetes Father   . Thyroid disease Mother   . Diabetes Mother   . Kidney disease Brother     from diabetes  . Breast cancer Maternal Aunt   . Breast cancer Maternal Aunt   . Melanoma Maternal Aunt   . Melanoma Maternal Aunt   . Other  Maternal Aunt     lupus anticardiolipin ab syndrome     Prior to Admission medications   Medication Sig Start Date End Date Taking? Authorizing Provider  Ascorbic Acid (VITAMIN C) 1000 MG tablet Take 1,000 mg by mouth daily.   Yes Historical Provider, MD  b complex vitamins capsule Take 1 capsule by mouth daily.   Yes Historical Provider, MD  cephALEXin (KEFLEX) 500 MG capsule Take 1 capsule (500 mg total) by mouth 4 (four) times daily. 04/10/13  Yes Illa Level, NP  Cyanocobalamin (VITAMIN B-12 PO) Take 2,500 mg by mouth daily.    Yes Historical Provider, MD  cyclobenzaprine (FLEXERIL) 5 MG tablet Take 5 mg by mouth 3 (three) times daily as needed for muscle spasms. 02/17/13  Yes Victorino December, MD  dextromethorphan-guaiFENesin [facility] DM) 30-600 MG per 12 hr tablet Take 1 tablet by mouth 2 (two) times daily.   Yes Historical Provider, MD  ferrous sulfate 325 (65 FE) MG tablet Take 1 tablet (325 mg total) by mouth 3 (three) times daily with meals. 04/07/13  Yes Illa Level, NP  folic acid (FOLVITE) 400 MCG tablet Take 400 mcg by mouth daily.     Yes Historical Provider, MD  furosemide (LASIX) 20 MG tablet Take 1 tablet (20 mg total) by mouth every other day. 03/16/13  Yes Esperanza Sheets, MD  lidocaine-prilocaine (EMLA) cream Apply 1 application topically as needed. Apply to port-a-cath site 1-2 hours prior to treatment. 10/29/12  Yes Victorino December, MD  loratadine (CLARITIN) 10 MG tablet Take 10 mg by mouth daily.   Yes Historical Provider, MD  magnesium gluconate (MAGONATE) 500 MG tablet Take 500 mg by mouth daily.   Yes Historical Provider, MD  miconazole nitrate (MICATIN) POWD Apply 1 application topically 4 (four) times daily. 04/07/13  Yes Illa Level, NP  Multiple Vitamins-Minerals (MULTIVITAMIN WITH MINERALS) tablet Take 1 tablet by mouth daily.    Yes Historical Provider, MD  nystatin ointment (MYCOSTATIN) Apply 1 application topically 2 (two) times daily. 03/10/13   Yes Keitha Butte, NP  ondansetron (ZOFRAN) 8 MG tablet Take 8 mg by mouth 2 (two) times daily. Take two times a day starting the day after chemo for 3 days. Then take two times a day as needed for nausea or vomiting. 11/04/12  Yes Illa Level, NP  oxyCODONE-acetaminophen (PERCOCET) 10-325 MG per tablet Take 1 tablet by mouth every 4 (four) hours as needed for pain. 04/01/13  Yes Illa Level, NP  PACLitaxel (TAXOL IV) Inject 1 each into the vein every 21 ( twenty-one) days.   Yes Historical Provider, MD  pantoprazole (PROTONIX) 40 MG tablet Take 40 mg by mouth daily. 12/23/12  Yes Illa Level, NP  pegfilgrastim (NEULASTA) 6 MG/0.6ML injection Inject 6 mg  into the skin every 21 ( twenty-one) days.   Yes Historical Provider, MD  potassium chloride (K-DUR) 10 MEQ tablet Take 2 tablets (20 mEq total) by mouth daily. 04/07/13  Yes Illa Level, NP  PRESCRIPTION MEDICATION every 21 ( twenty-one) days. CHEMOTHERAPY REGIMEN   Yes Historical Provider, MD  prochlorperazine (COMPAZINE) 10 MG tablet Take 10 mg by mouth every 6 (six) hours as needed (Nausea or vomiting). 11/04/12  Yes Illa Level, NP  promethazine (PHENERGAN) 25 MG tablet Take 12.5 mg by mouth every 6 (six) hours as needed for nausea or vomiting.   Yes Historical Provider, MD  sucralfate (CARAFATE) 1 GM/10ML suspension Take 10 mLs (1 g total) by mouth 4 (four) times daily. 04/07/13  Yes Illa Level, NP   Physical Exam: Filed Vitals:   04/13/13 1130 04/13/13 1300 04/13/13 1537 04/13/13 1637  BP:   102/58 99/50  Pulse: 102  94 96  Temp: 97.8 F (36.6 C)   98.1 F (36.7 C)  TempSrc: Oral   Oral  Resp: 19  20 18   Height:  5' 10.87" (1.8 m)    Weight:  192.6 kg (424 lb 9.7 oz)    SpO2: 99%  100% 100%     General:  Obese CF, NAD  Eyes:  PERRL, anicteric, non-injected.  ENT:  Nares clear.  OP clear, non-erythematous without plaques or exudates.  MMM.  Neck:  Supple without TM or JVD.     Lymph:  No cervical, supraclavicular, or submandibular LAD.  Cardiovascular:  RRR, normal S1, S2, without m/r/g.  2+ pulses, warm extremities  Respiratory:  CTA bilaterally without increased WOB.  Abdomen:  NABS.  Soft, ND/NT.    Skin:   Healed dermatitis in groin and under right breast.  Skin of RUE is mildly injected, same with right lateral breast.    Musculoskeletal:  Normal bulk and tone.  Trace LLE pitting edema, 3+ RUE pitting edema.  Warm to touch.    Psychiatric:  A & O x 4.  Appropriate affect.  Neurologic:  CN 3-12 intact.  5/5 strength.  Sensation intact.  Labs on Admission:  Basic Metabolic Panel:  Recent Labs Lab 04/07/13 1108 04/13/13 1225  NA 139 138  K 3.4* 3.8  CL  --  99  CO2 29 29  GLUCOSE 114 103*  BUN 6.6* 7  CREATININE 0.6 0.61  CALCIUM 9.2 8.9   Liver Function Tests:  Recent Labs Lab 04/07/13 1108  AST 48*  ALT 39  ALKPHOS 154*  BILITOT 0.54  PROT 6.4  ALBUMIN 2.8*   No results found for this basename: LIPASE, AMYLASE,  in the last 168 hours No results found for this basename: AMMONIA,  in the last 168 hours CBC:  Recent Labs Lab 04/07/13 1108 04/13/13 1225  WBC 2.3* 4.6  NEUTROABS 1.4* 2.9  HGB 9.6* 9.5*  HCT 29.8* 31.3*  MCV 107.4* 108.7*  PLT 145 166   Cardiac Enzymes: No results found for this basename: CKTOTAL, CKMB, CKMBINDEX, TROPONINI,  in the last 168 hours  BNP (last 3 results)  Recent Labs  03/12/13 0845  PROBNP 2322.0*   CBG: No results found for this basename: GLUCAP,  in the last 168 hours  Radiological Exams on Admission: No results found.  Assessment/Plan Active Problems:   Obesity - BMI 79   Bright red rectal bleeding   Endometrial cancer   Acute respiratory failure with hypoxia   OSA (obstructive sleep apnea)   DVT of upper extremity (  deep vein thrombosis)  ---  RUE DVT likely secondary to metastatic cancer and port.   -  Heparin gtt -  IR consult for port removal -  Elevate  extremity  -  Continue percocet prn pain -  Plan to place PICC hopefully during port removal for ongoing chemotherapy -  Anticipate transitioning to lovenox for ongoing a/c  -  Pharmacist to dose by anti-Xa levels  Chronic lower extremity swelling, likely venous stasis.  Will not pursue further duplex as will not change management at this time.   -  Continue lasix  Metastatic endometrial cancer, appreciate oncology assistance.    Macrocytic anemia, likely due to chemotherapy + iron deficiency -  Vit B12 and folate wnl one month ago -  Continue iron supplementation  Diet:  Regular, then NPO at MN Access:  PIV - do not use port IVF:  off Proph:  Heparin gtt  Code Status: full code  Family Communication: patient and her mother Disposition Plan: Admit to telemetry  Time spent: 60 min Renae Fickle [facility] Pager [phone]  If 7PM-7AM, please contact night-coverage [email or web address] Password [facility] 04/13/2013, 6:39 PM

## 2013-04-13 NOTE — Progress Notes (Signed)
Right upper extremity venous duplex:  DVT noted in the right subclavian, axillary, and brachial veins.  Superficial thrombus is noted in the right cephalic and basilic veins.  Right ulnar and radial veins not visualized.  Technically difficult study due to the patient's body habitus.

## 2013-04-13 NOTE — Telephone Encounter (Signed)
Patient's mother called with concerns about [name]. She states that her right arm and breast swelling has worsened over the weekend. She states that her breast is the size of a basketball and that "she doesn't have a nipple". She states that she is taking the Keflex as prescribed and is also taking the lasix as prescribed and is only getting up to void about 3 times a day. Patient's mother states she is very concerned for her daughter. She states that [name] refused to be taken to the ED over the weekend and would like to be seen in the office today. Per Larna Daughters NP patient should go to the ED for eval.  Returned patient's mother's call. Instructed her that [name] should go to the ED as advised by Larna Daughters NP. Per patient's mother, patient is willing to go to the ED today and they will call for transport. Advised patient's mother to call for any future questions or concerns.

## 2013-04-13 NOTE — Progress Notes (Signed)
Interdry material placed under breast and folds at groin.

## 2013-04-13 NOTE — ED Notes (Signed)
IV team paged.  

## 2013-04-13 NOTE — ED Notes (Signed)
Per EMS-pt here from home with c/o of right arm and right breast swelling. Pt has stomach cancer, chemo. Pain 10/10.

## 2013-04-13 NOTE — ED Notes (Signed)
IV team at bedside 

## 2013-04-14 ENCOUNTER — Inpatient Hospital Stay (HOSPITAL_COMMUNITY): Payer: BC Managed Care – PPO

## 2013-04-14 DIAGNOSIS — S32020A Wedge compression fracture of second lumbar vertebra, initial encounter for closed fracture: Secondary | ICD-10-CM | POA: Diagnosis present

## 2013-04-14 DIAGNOSIS — S32010A Wedge compression fracture of first lumbar vertebra, initial encounter for closed fracture: Secondary | ICD-10-CM | POA: Diagnosis present

## 2013-04-14 DIAGNOSIS — C549 Malignant neoplasm of corpus uteri, unspecified: Secondary | ICD-10-CM

## 2013-04-14 DIAGNOSIS — S32009A Unspecified fracture of unspecified lumbar vertebra, initial encounter for closed fracture: Secondary | ICD-10-CM

## 2013-04-14 DIAGNOSIS — I82629 Acute embolism and thrombosis of deep veins of unspecified upper extremity: Secondary | ICD-10-CM

## 2013-04-14 HISTORY — DX: Wedge compression fracture of second lumbar vertebra, initial encounter for closed fracture: S32.020A

## 2013-04-14 LAB — CBC
Hemoglobin: 8.4 g/dL — ABNORMAL LOW (ref 12.0–15.0)
MCH: 32.1 pg (ref 26.0–34.0)
MCV: 106.9 fL — ABNORMAL HIGH (ref 78.0–100.0)
Platelets: 146 10*3/uL — ABNORMAL LOW (ref 150–400)
RDW: 19.1 % — ABNORMAL HIGH (ref 11.5–15.5)

## 2013-04-14 LAB — BASIC METABOLIC PANEL
BUN: 8 mg/dL (ref 6–23)
Calcium: 8.4 mg/dL (ref 8.4–10.5)
Creatinine, Ser: 0.61 mg/dL (ref 0.50–1.10)
GFR calc Af Amer: >90 mL/min (ref >90)

## 2013-04-14 LAB — HEPARIN LEVEL (UNFRACTIONATED): Heparin Unfractionated: 0.2 IU/mL — ABNORMAL LOW (ref 0.30–0.70)

## 2013-04-14 MED ORDER — LIDOCAINE-EPINEPHRINE (PF) 2 %-1:200000 IJ SOLN
INTRAMUSCULAR | Status: AC
  Filled 2013-04-14: qty 20

## 2013-04-14 MED ORDER — DEXTROSE 5 % IV SOLN
3.0000 g | INTRAVENOUS | Status: AC
  Administered 2013-04-14: 3 g via INTRAVENOUS
  Filled 2013-04-14: qty 3000

## 2013-04-14 MED ORDER — POTASSIUM CHLORIDE 10 MEQ/100ML IV SOLN
10.0000 meq | INTRAVENOUS | Status: DC
  Filled 2013-04-14 (×2): qty 100

## 2013-04-14 MED ORDER — DEXTROSE 5 % IV SOLN: 3.0000 g | INTRAVENOUS | Status: DC

## 2013-04-14 MED ORDER — SODIUM CHLORIDE 0.9 % IJ SOLN
10.0000 mL | INTRAMUSCULAR | Status: DC | PRN
  Administered 2013-04-15 – 2013-04-17 (×4): 10 mL

## 2013-04-14 MED ORDER — POTASSIUM CHLORIDE CRYS ER 20 MEQ PO TBCR
40.0000 meq | EXTENDED_RELEASE_TABLET | Freq: Once | ORAL | Status: AC
  Administered 2013-04-14: 40 meq via ORAL
  Filled 2013-04-14: qty 2

## 2013-04-14 MED ORDER — HEPARIN (PORCINE) IN NACL 100-0.45 UNIT/ML-% IJ SOLN
2700.0000 [IU]/h | INTRAMUSCULAR | Status: DC
  Administered 2013-04-14 – 2013-04-15 (×2): 2700 [IU]/h via INTRAVENOUS
  Filled 2013-04-14 (×3): qty 250

## 2013-04-14 MED ORDER — FENTANYL CITRATE 0.05 MG/ML IJ SOLN
INTRAMUSCULAR | Status: DC | PRN
  Administered 2013-04-14: 25 ug via INTRAVENOUS

## 2013-04-14 MED ORDER — HEPARIN BOLUS VIA INFUSION
3000.0000 [IU] | Freq: Once | INTRAVENOUS | Status: AC
  Administered 2013-04-14: 3000 [IU] via INTRAVENOUS
  Filled 2013-04-14: qty 3000

## 2013-04-14 MED ORDER — DEXTROSE 5 % IV SOLN
3.0000 g | Freq: Once | INTRAVENOUS | Status: AC
  Administered 2013-04-14: 3 g via INTRAVENOUS
  Filled 2013-04-14 (×2): qty 3000

## 2013-04-14 MED ORDER — LIDOCAINE HCL 1 % IJ SOLN
INTRAMUSCULAR | Status: AC
  Filled 2013-04-14: qty 20

## 2013-04-14 MED ORDER — FENTANYL CITRATE 0.05 MG/ML IJ SOLN
INTRAMUSCULAR | Status: AC
  Filled 2013-04-14: qty 6

## 2013-04-14 MED ORDER — FENTANYL CITRATE 0.05 MG/ML IJ SOLN
INTRAMUSCULAR | Status: AC | PRN
  Administered 2013-04-14: 25 ug via INTRAVENOUS
  Administered 2013-04-14: 50 ug via INTRAVENOUS

## 2013-04-14 NOTE — Progress Notes (Signed)
UR completed.  Patient changed to inpatient r/t requiring IV heparin gtt.

## 2013-04-14 NOTE — Procedures (Signed)
Successful placement of left cephalic vein approach 55 cm single lumen PICC line with tip at the superior caval-atrial junction.  The PICC line is ready for immediate use.

## 2013-04-14 NOTE — Progress Notes (Signed)
Heparin infusion stopped per order, normal saline started at 20ml via hour

## 2013-04-14 NOTE — Procedures (Signed)
Successful removal of right anterior chest wall port-a-cath. No immediate post procedural complications.  

## 2013-04-14 NOTE — Progress Notes (Signed)
Rx Brief Anticoagulation note:  IV Heparin  Assessment:  HL= 0.20 after 3500 rebolus and drip @ 2350 units/hr  No IV interuptions/bleeding noted per RN  Plan:  Rebolus with 3000 units IV  Heparin x1 now  Increase drip to 2900 units/hr  Recheck HL in 6 hours (~1300)  Lorenza Evangelist 04/14/2013 6:19 AM

## 2013-04-14 NOTE — Progress Notes (Signed)
Spoke with Matt from IR who stated Dr. Grace Isaac instructed to restart the heparin at 6pm.  Peripheral IV in left arm still in place, spoke with IV team who stated it must be removed since in the same limb as PICC.  Notified pharmacy that the heparin drip needs to be restarted at 6pm

## 2013-04-14 NOTE — Progress Notes (Signed)
Notified central telemetry that patient has left floor for interventional radiology for procedure.

## 2013-04-14 NOTE — Progress Notes (Addendum)
[facility] PROGRESS NOTE  [name] WUJ:[id number] DOB: [birth year] DOA: 04/13/2013 PCP: [name], MD  Assessment/Plan  RUE DVT likely secondary to metastatic cancer and port.   - Heparin gtt  - Port removal and PICC line placement today by IR - Elevate extremity  - Continue percocet prn pain  - Anticipate transitioning to lovenox for ongoing a/c after kyphoplasty - Pharmacist to dose by anti-Xa levels   Chronic lower extremity swelling, likely venous stasis. Will not pursue further duplex as will not change management at this time.  - Continue lasix   Metastatic endometrial cancer, appreciate oncology assistance.   Macrocytic anemia, likely due to chemotherapy + iron deficiency  - Vit B12 and folate wnl one month ago  - Continue iron supplementation   L1/L2 compression fractures with ongoing back pain making ambulation difficult -  IR for kyphoplasty on Wednesday  -  NPO at MN  Hypokalemia, due to lasix -  Oral potassium supplement   Diet: Regular, then NPO at MN  Access: PIV - do not use port  IVF: off  Proph: Heparin gtt   Code Status: full code  Family Communication: patient and her mother  Disposition Plan:  May d/c telemetry today.  Plan for port removal today and kyphoplasty tomorrow.  Transition to lovenox post-kyphoplasty once deemed safe by IR.  Will need anti-Xa level done 4 hours after fourth dose of lovenox to verify dose.  Will have case management investigate to see if this could be done from home.     Consultants:  Oncology  Procedures:  Heparin gtt  Duplex upper extremity  Port removal and picc placement 12/23  Antibiotics:  none   HPI/Subjective:  Denies CP, SOB.  Right arm still persistently swollen. She has some mild swelling of her left upper extremity and bilateral lower to extremities  Objective: Filed Vitals:   04/13/13 1537 04/13/13 1637 04/13/13 2131 04/14/13 0500  BP: 102/58 99/50 100/53 112/72  Pulse: 94 96  95 88  Temp:  98.1 F (36.7 C) 98.5 F (36.9 C) 97.9 F (36.6 C)  TempSrc:  Oral Oral Oral  Resp: 20 18 22 20   Height:      Weight:      SpO2: 100% 100% 97% 100%   No intake or output data in the 24 hours ending 04/14/13 1247 Filed Weights   04/13/13 1300  Weight: 192.6 kg (424 lb 9.7 oz)    Exam:   General:  Obese Caucasian female, No acute distress  HEENT:  NCAT, MMM  Cardiovascular:  RRR, nl S1, S2 no mrg, 2+ pulses, warm extremities  Respiratory:  CTAB, no increased WOB  Abdomen:   NABS, soft, NT/ND  MSK:   1+ bilateral lower extremities edema, left hand mildly puffy, 1+ pitting edema, right upper extremity with 3+ pitting edema and persistent mild warmth and injection  Neuro:  Grossly intact  Data Reviewed: Basic Metabolic Panel:  Recent Labs Lab 04/13/13 1225 04/14/13 0545  NA 138 136  K 3.8 3.4*  CL 99 98  CO2 29 29  GLUCOSE 103* 89  BUN 7 8  CREATININE 0.61 0.61  CALCIUM 8.9 8.4   Liver Function Tests: No results found for this basename: AST, ALT, ALKPHOS, BILITOT, PROT, ALBUMIN,  in the last 168 hours No results found for this basename: LIPASE, AMYLASE,  in the last 168 hours No results found for this basename: AMMONIA,  in the last 168 hours CBC:  Recent Labs Lab 04/13/13 1225 04/14/13  0545  WBC 4.6 4.6  NEUTROABS 2.9  --   HGB 9.5* 8.4*  HCT 31.3* 28.0*  MCV 108.7* 106.9*  PLT 166 146*   Cardiac Enzymes: No results found for this basename: CKTOTAL, CKMB, CKMBINDEX, TROPONINI,  in the last 168 hours BNP (last 3 results)  Recent Labs  03/12/13 0845  PROBNP 2322.0*   CBG: No results found for this basename: GLUCAP,  in the last 168 hours  No results found for this or any previous visit (from the past 240 hour(s)).   Studies: No results found.  Scheduled Meds: . dextromethorphan-guaiFENesin  1 tablet Oral BID  . enoxaparin   Does not apply Once  . ferrous sulfate  325 mg Oral TID WC  . furosemide  20 mg Oral QODAY  .  miconazole nitrate  1 application Topical QID  . multivitamin with minerals  1 tablet Oral Daily  . nystatin ointment  1 application Topical BID  . pantoprazole  40 mg Oral Daily  . potassium chloride  20 mEq Oral Daily  . sodium chloride  3 mL Intravenous Q12H  . sucralfate  1 g Oral QID   Continuous Infusions:   Active Problems:   Obesity - BMI 79   Bright red rectal bleeding   Endometrial cancer   Acute respiratory failure with hypoxia   OSA (obstructive sleep apnea)   DVT of upper extremity (deep vein thrombosis)    Time spent: 30 min    Charly Holcomb, [facility]  [facility] Pager [phone]. If 7PM-7AM, please contact night-coverage at [email or web address], password [facility] 04/14/2013, 12:47 PM  LOS: 1 day

## 2013-04-14 NOTE — Progress Notes (Signed)
Spoke with IR regarding patient's pending procedural requests.  Informed that patient is on heparin infusion at this time and to please advise when procedures have been scheduled.

## 2013-04-14 NOTE — Progress Notes (Signed)
[name]   DOB:[birth year]   ZO#:[id number]   JWJ#:[id number]  Subjective: [name] is comfortable in bed but tells me she has not been able to walk at all because of back pain; she has been waiting to have lumbar kyphoplasty and hopes to get it done this admission; she is appropriately concerned regarding the lymphedema and agrees to pull the port. Note she only has one more cycle of chemo planned. Aunt and mother in room   Objective: young White woman examined in bed Filed Vitals:   04/14/13 0500  BP: 112/72  Pulse: 88  Temp: 97.9 F (36.6 C)  Resp: 20    Body mass index is 59.44 kg/(m^2). No intake or output data in the 24 hours ending 04/14/13 0824   Sclerae unicteric  No cervical adenopathy  Lungs clear -- auscultated anterolaterally  Heart regular rate and rhythm  Abdomen obese, NT, +BS  MSK RUE grade 2 lymphedema, with mottled erythema  Neuro nonfocal, well-oriented, pleasant affect  Breast exam: deferred  CBG (last 3)  No results found for this basename: GLUCAP,  in the last 72 hours   Labs:  Lab Results  Component Value Date   WBC 4.6 04/14/2013   HGB 8.4* 04/14/2013   HCT 28.0* 04/14/2013   MCV 106.9* 04/14/2013   PLT 146* 04/14/2013   NEUTROABS 2.9 04/13/2013    @LASTCHEMISTRY @  Urine Studies No results found for this basename: UACOL, UAPR, USPG, UPH, UTP, UGL, UKET, UBIL, UHGB, UNIT, UROB, ULEU, UEPI, UWBC, URBC, UBAC, CAST, CRYS, UCOM, BILUA,  in the last 72 hours  Basic Metabolic Panel:  Recent Labs Lab 04/07/13 1108 04/13/13 1225 04/14/13 0545  NA 139 138 136  K 3.4* 3.8 3.4*  CL  --  99 98  CO2 29 29 29   GLUCOSE 114 103* 89  BUN 6.6* 7 8  CREATININE 0.6 0.61 0.61  CALCIUM 9.2 8.9 8.4   GFR Estimated Creatinine Clearance: 167.2 ml/min (by C-G formula based on Cr of 0.61). Liver Function Tests:  Recent Labs Lab 04/07/13 1108  AST 48*  ALT 39  ALKPHOS 154*  BILITOT 0.54  PROT 6.4  ALBUMIN 2.8*   No results found for this basename:  LIPASE, AMYLASE,  in the last 168 hours No results found for this basename: AMMONIA,  in the last 168 hours Coagulation profile  Recent Labs Lab 04/13/13 1430  INR 0.99    CBC:  Recent Labs Lab 04/07/13 1108 04/13/13 1225 04/14/13 0545  WBC 2.3* 4.6 4.6  NEUTROABS 1.4* 2.9  --   HGB 9.6* 9.5* 8.4*  HCT 29.8* 31.3* 28.0*  MCV 107.4* 108.7* 106.9*  PLT 145 166 146*   Cardiac Enzymes: No results found for this basename: CKTOTAL, CKMB, CKMBINDEX, TROPONINI,  in the last 168 hours BNP: No components found with this basename: POCBNP,  CBG: No results found for this basename: GLUCAP,  in the last 168 hours D-Dimer No results found for this basename: DDIMER,  in the last 72 hours Hgb A1c No results found for this basename: HGBA1C,  in the last 72 hours Lipid Profile No results found for this basename: CHOL, HDL, LDLCALC, TRIG, CHOLHDL, LDLDIRECT,  in the last 72 hours Thyroid function studies No results found for this basename: TSH, T4TOTAL, FREET3, T3FREE, THYROIDAB,  in the last 72 hours Anemia work up No results found for this basename: VITAMINB12, FOLATE, FERRITIN, TIBC, IRON, RETICCTPCT,  in the last 72 hours Microbiology No results found for this or any previous visit (  from the past 240 hour(s)).    Studies:  No results found.  Assessment: 47 y.o. [name] woman admitted with   1) history of recurrent endometrial adenocarcinoma initially diagnosed in March of 2009. Robotic hysterectomy bilateral salpingo-oophorectomy with minilaparotomy for removal of the specimen was performed in September 2011. 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 [name] is currently day 14 cycle 7 of 8 planned cycles of carboplatin and paclitaxel  2) right UE DVT, port-associated, with grade 2 right UP lymphedema +/- cellulitis  3) poorly controlled pain/  bedbound due to lumbar compression fractures; history of prior T10-T11 kyphoplasty per IR ([name])   Plan:  Normally we would proceed now to lovenox at 1 mg/kg BID and check factor 10 activity after 3 doses. However, the patient is interested in 2 procedures, (1) kyphoplasty of lumbar fractures which she hopes will relieve her pain sufficiently that she will be able to ambulate again, and (2) port removal, which may help relieve the upper extremity lymphedema. Per IR, they may be able to do the port removal today and the kyphoplasty tomorrow (after review of outside films).  Accordingly suggest: -- continue IV heparin until both procedures completed (IR will work with pharmacy to interrupt the heparin drip as needed to minimize bleeding complications) -- after procedures completed, start lovenox at 1 mg/kg sQ BID and check factor 10 activity 4 hours after 3d dose  Patient will need HHN support at home but she is able to self-inject with lovenox. It is not clear that she will need a PICC--if necessary for her last dose of chemo that can always be addressed later.  Will follow with you   Lowella Dell, MD 04/14/2013  8:24 AM

## 2013-04-14 NOTE — Progress Notes (Signed)
Patient received from radiology, alert without complaints of pain at this time.  Right upper chest dressing clean dry and intact, left bicep PICC dressing clean dry and intact.  Peripheral iv still in place.

## 2013-04-14 NOTE — Progress Notes (Addendum)
Rx Brief Anticoagulation note:  IV Heparin  Assessment:  Heparin for DVT, patient had PAC removal by IR today and placement of PICC (heparin stopped at 11am for procedure)  IR gave verbal order to RN to resume heparin at 18:00 tonight  Appears single lumen PICC placed which may cause compatibility issues with other IV medications  Previous heparin level = 0.2 on heparin 2350 units/hr  Heparin level goal 0.3-0.7 Heparin dosing wt = 120kg  Plan:  At 18:00, resume heparin gtt at 2700 units/hr  Check HL in ~6 hours (midnight)  Juliette Alcide, PharmD, BCPS.   Pager: [phone] 04/14/2013  4:59 PM

## 2013-04-14 NOTE — Progress Notes (Signed)
Patient ID: [name], female   DOB: [birth year], 47 y.o.   MRN: [id number] Request received for port a cath removal, PICC placement and possible VP/KP on pt with hx metastatic endometrial carcinoma, recently noted RUE DVT and worsening lower back pain/acute L1/2 compression fractures which are limiting pt's mobility. 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 Corliss Skains . She states that over the last 3 months her low back pain has increased in intensity and also causes LE radiculopathy; no hx recent trauma.Marland Kitchen MRI of thoracic/lumbar spine performed at [facility] 12/4 reveals acute L1/2 compression fractures. Images were reviewed by Dr. Corliss Skains and he feels pt is candidate for VP. Additional PMH as below. Exam: pt awake/alert; chest- CTA bilat ant; heart- RRR; abd- morbidly obese,soft,+BS,NT; ext- FROM, 2-3+ edema RUE with assoc erythema/tenderness to palpation,trace-1+ edema bilat LE; strength/sens fxn intact.  Filed Vitals:   04/13/13 1537 04/13/13 1637 04/13/13 2131 04/14/13 0500  BP: 102/58 99/50 100/53 112/72  Pulse: 94 96 95 88  Temp:  98.1 F (36.7 C) 98.5 F (36.9 C) 97.9 F (36.6 C)  TempSrc:  Oral Oral Oral  Resp: 20 18 22 20   Height:      Weight:      SpO2: 100% 100% 97% 100%   Past Medical History  Diagnosis Date  . Obesity   . DVT (deep venous thrombosis) 2009    RLE, tx for ~6 months  . PONV (postoperative nausea and vomiting)   . Swelling     BOTH LEGS  . Rash     LOWER ABD  . Gallstones   . Ascites   . Uterine cancer     Dr. Welton Flakes, chemotherapy  . Family history of anesthesia complication     MOTHER IS DIFFICULT TO WAKE  . Gallstones   . Cellulitis of leg, right    Past Surgical History  Procedure Laterality Date  . Ankle surgery  1990  . Wisdom tooth extraction    . Tonsillectomy and adenoidectomy    . Abdominal hysterectomy  12/2009    [facility], [facility]  . Laparoscopy N/A 10/02/2012    Procedure: LAPAROSCOPY DIAGNOSTIC, perocentisis, omental biopsy;   Surgeon: Wilmon Arms. Tsuei, MD;  Location: [facility];  Service: General;  Laterality: N/A;  removed a total of 15,000 of acities  . Paracentesis    . Kyphoplasty  02/26/2013   Results for orders placed during the hospital encounter of 04/13/13  CBC WITH DIFFERENTIAL      Result Value Range   WBC 4.6  4.0 - 10.5 K/uL   RBC 2.88 (*) 3.87 - 5.11 MIL/uL   Hemoglobin 9.5 (*) 12.0 - 15.0 g/dL   HCT 40.9 (*) 81.1 - 91.4 %   MCV 108.7 (*) 78.0 - 100.0 fL   MCH 33.0  26.0 - 34.0 pg   MCHC 30.4  30.0 - 36.0 g/dL   RDW 78.2 (*) 95.6 - 21.3 %   Platelets 166  150 - 400 K/uL   Neutrophils Relative % 62  43 - 77 %   Neutro Abs 2.9  1.7 - 7.7 K/uL   Lymphocytes Relative 23  12 - 46 %   Lymphs Abs 1.1  0.7 - 4.0 K/uL   Monocytes Relative 14 (*) 3 - 12 %   Monocytes Absolute 0.7  0.1 - 1.0 K/uL   Eosinophils Relative 1  0 - 5 %   Eosinophils Absolute 0.1  0.0 - 0.7 K/uL   Basophils Relative  0  0 - 1 %   Basophils Absolute 0.0  0.0 - 0.1 K/uL  BASIC METABOLIC PANEL      Result Value Range   Sodium 138  135 - 145 mEq/L   Potassium 3.8  3.5 - 5.1 mEq/L   Chloride 99  96 - 112 mEq/L   CO2 29  19 - 32 mEq/L   Glucose, Bld 103 (*) 70 - 99 mg/dL   BUN 7  6 - 23 mg/dL   Creatinine, Ser 8.29  0.50 - 1.10 mg/dL   Calcium 8.9  8.4 - 56.2 mg/dL   GFR calc non Af Amer >90  >90 mL/min   GFR calc Af Amer >90  >90 mL/min  URINALYSIS W MICROSCOPIC + REFLEX CULTURE      Result Value Range   Color, Urine ORANGE (*) YELLOW   APPearance CLOUDY (*) CLEAR   Specific Gravity, Urine 1.031 (*) 1.005 - 1.030   pH 6.0  5.0 - 8.0   Glucose, UA NEGATIVE  NEGATIVE mg/dL   Hgb urine dipstick MODERATE (*) NEGATIVE   Bilirubin Urine NEGATIVE  NEGATIVE   Ketones, ur NEGATIVE  NEGATIVE mg/dL   Protein, ur 30 (*) NEGATIVE mg/dL   Urobilinogen, UA 1.0  0.0 - 1.0 mg/dL   Nitrite NEGATIVE  NEGATIVE   Leukocytes, UA MODERATE (*) NEGATIVE   WBC, UA 3-6  <3 WBC/hpf   RBC / HPF 3-6  <3 RBC/hpf   Bacteria, UA RARE  RARE    Squamous Epithelial / LPF FEW (*) RARE   Urine-Other LESS THAN 10 mL OF URINE SUBMITTED    PROTIME-INR      Result Value Range   Prothrombin Time 12.9  11.6 - 15.2 seconds   INR 0.99  0.00 - 1.49  APTT      Result Value Range   aPTT 20 (*) 24 - 37 seconds  HEPARIN LEVEL (UNFRACTIONATED)      Result Value Range   Heparin Unfractionated 0.12 (*) 0.30 - 0.70 IU/mL  CBC      Result Value Range   WBC 4.6  4.0 - 10.5 K/uL   RBC 2.62 (*) 3.87 - 5.11 MIL/uL   Hemoglobin 8.4 (*) 12.0 - 15.0 g/dL   HCT 13.0 (*) 86.5 - 78.4 %   MCV 106.9 (*) 78.0 - 100.0 fL   MCH 32.1  26.0 - 34.0 pg   MCHC 30.0  30.0 - 36.0 g/dL   RDW 69.6 (*) 29.5 - 28.4 %   Platelets 146 (*) 150 - 400 K/uL  BASIC METABOLIC PANEL      Result Value Range   Sodium 136  135 - 145 mEq/L   Potassium 3.4 (*) 3.5 - 5.1 mEq/L   Chloride 98  96 - 112 mEq/L   CO2 29  19 - 32 mEq/L   Glucose, Bld 89  70 - 99 mg/dL   BUN 8  6 - 23 mg/dL   Creatinine, Ser 1.32  0.50 - 1.10 mg/dL   Calcium 8.4  8.4 - 44.0 mg/dL   GFR calc non Af Amer >90  >90 mL/min   GFR calc Af Amer >90  >90 mL/min  HEPARIN LEVEL (UNFRACTIONATED)      Result Value Range   Heparin Unfractionated 0.20 (*) 0.30 - 0.70 IU/mL   A/P: Pt with hx of metastatic endometrial carcinoma, acute RUE DVT, low back pain, previous T10/11 VP  02/2013; now with acute L1/2 compression fractures. Tent plan is for port a cath  removal, PICC placement today at [facility] and L1/2 VP 12/24 at [facility]. Details/risks of procedure d/w pt/family with their understanding and consent.

## 2013-04-14 NOTE — Progress Notes (Signed)
PT/OT Cancellation Note  Patient Details Name: [name] MRN: [id number] DOB: Dec 29, 1965   Cancelled Treatment:    Reason Eval/Treat Not Completed: Patient at procedure or test/unavailable;Medical issues which prohibited therapy (heparin subtherapeutic, pt leaving to go to IR for PICC placement). Per RN pt to have KP tomorrow at [facility]. Will hold PT/OT until after KP, per RN recommendation.    Ralene Bathe Kistler 04/14/2013, 11:43 AM [phone]

## 2013-04-14 NOTE — Progress Notes (Signed)
Notified IR that patient's MRI disc is at nurses station

## 2013-04-15 ENCOUNTER — Other Ambulatory Visit: Payer: Self-pay | Admitting: Oncology

## 2013-04-15 ENCOUNTER — Ambulatory Visit (HOSPITAL_COMMUNITY): Admit: 2013-04-15 | Payer: BC Managed Care – PPO

## 2013-04-15 ENCOUNTER — Other Ambulatory Visit: Payer: Self-pay | Admitting: *Deleted

## 2013-04-15 DIAGNOSIS — D649 Anemia, unspecified: Secondary | ICD-10-CM

## 2013-04-15 LAB — BASIC METABOLIC PANEL
BUN: 6 mg/dL (ref 6–23)
CO2: 23 mEq/L (ref 19–32)
Calcium: 6.6 mg/dL — ABNORMAL LOW (ref 8.4–10.5)
GFR calc Af Amer: >90 mL/min (ref >90)
GFR calc non Af Amer: >90 mL/min (ref >90)
Sodium: 127 mEq/L — ABNORMAL LOW (ref 135–145)

## 2013-04-15 LAB — HEPARIN LEVEL (UNFRACTIONATED): Heparin Unfractionated: 0.36 IU/mL (ref 0.30–0.70)

## 2013-04-15 LAB — CBC
Hemoglobin: 5 g/dL — CL (ref 12.0–15.0)
MCH: 33.1 pg (ref 26.0–34.0)
Platelets: 154 10*3/uL (ref 150–400)
RBC: 1.51 MIL/uL — ABNORMAL LOW (ref 3.87–5.11)

## 2013-04-15 LAB — PREPARE RBC (CROSSMATCH)

## 2013-04-15 MED ORDER — POTASSIUM CHLORIDE CRYS ER 20 MEQ PO TBCR
40.0000 meq | EXTENDED_RELEASE_TABLET | Freq: Two times a day (BID) | ORAL | Status: AC
  Administered 2013-04-15: 40 meq via ORAL
  Filled 2013-04-15 (×2): qty 2

## 2013-04-15 MED ORDER — POTASSIUM CHLORIDE 10 MEQ/100ML IV SOLN
10.0000 meq | INTRAVENOUS | Status: DC
  Filled 2013-04-15 (×4): qty 100

## 2013-04-15 NOTE — Progress Notes (Signed)
ANTICOAGULATION CONSULT NOTE - Follow Up Consult  Pharmacy Consult for Heparin Indication: RUE DVT  No Known Allergies  Patient Measurements: Height: 5' 10.87" (180 cm) Weight: 424 lb 9.7 oz (192.6 kg) IBW/kg (Calculated) : 70.49   Vital Signs: Temp: 98 F (36.7 C) (12/24 0600) Temp src: Oral (12/24 0600) BP: 115/72 mmHg (12/24 0600) Pulse Rate: 90 (12/24 0600)  Labs:  Recent Labs  04/13/13 1225 04/13/13 1430 04/13/13 2108 04/14/13 0545 04/15/13 0040 04/15/13 0425  HGB 9.5*  --   --  8.4*  --  5.0*  HCT 31.3*  --   --  28.0*  --  16.4*  PLT 166  --   --  146*  --  154  APTT  --  20*  --   --   --   --   LABPROT  --  12.9  --   --   --   --   INR  --  0.99  --   --   --   --   HEPARINUNFRC  --   --  0.12* 0.20* 0.36  --   CREATININE 0.61  --   --  0.61  --  0.49*    Estimated Creatinine Clearance: 167.2 ml/min (by C-G formula based on Cr of 0.49).   Medications:  Scheduled:  . dextromethorphan-guaiFENesin  1 tablet Oral BID  . enoxaparin   Does not apply Once  . ferrous sulfate  325 mg Oral TID WC  . furosemide  20 mg Oral QODAY  . miconazole nitrate  1 application Topical QID  . multivitamin with minerals  1 tablet Oral Daily  . nystatin ointment  1 application Topical BID  . pantoprazole  40 mg Oral Daily  . potassium chloride  40 mEq Oral BID  . sodium chloride  3 mL Intravenous Q12H  . sucralfate  1 g Oral QID   Infusions:   PRN: acetaminophen, cyclobenzaprine, fentaNYL, oxyCODONE, oxyCODONE-acetaminophen, promethazine, sodium chloride  Assessment: 47 y/o F on IV heparin for treatment of catheter-associated RUE DVT.  The Port-A-Cath was removed on 04/14/13.  Plan was to interrupt heparin today for kyphoplasty by IR, then convert to Lovenox.  Heparin level therapeutic  However, Hgb down to 5.0.   No overt bleeding noted.  Patient states she feels well.  Source of blood loss is undetermined.  MD has discontinued heparin for now.  For 2 units PRBC  today.   Goal of Therapy:  Heparin level 0.3-0.7 units/ml Monitor platelets by anticoagulation protocol: Yes   Plan:  1.  Heparin discontinued for now per MD. 2.  Await evaluation of source of blood loss. 3.  Follow up clinical course, await word on whether or not anticoagulation can be safety resumed later.   Elie Goody, PharmD, BCPS Pager: [phone] 04/15/2013  9:43 AM

## 2013-04-15 NOTE — Progress Notes (Signed)
Notified of critical Hgb of 5.0.  Floor coverage notified and new orders placed to transfuse 2 units

## 2013-04-15 NOTE — Progress Notes (Signed)
[facility]  Patient Status: Active  [facility] is providing the following services: RN, PT  If patient discharges after hours, please call [phone].   [name] 04/15/2013, 9:00 AM

## 2013-04-15 NOTE — Progress Notes (Signed)
Pt Hgb-5.0 and K-2.7, [id number]. Dr. Rito Ehrlich called. Kyphoplasty canceled at [facility]. Pt received PO KCL 40 meq. And is receiving 2 units of blood. Family at bedside.

## 2013-04-15 NOTE — Progress Notes (Signed)
Rx Brief Anticoagulation note:  IV Heparin  Assessment:  Heparin for DVT, patient had PAC removal by IR today and placement of PICC (heparin stopped at 11am for procedure)  IR gave verbal order to RN to resume heparin at 18:00 tonight  Appears single lumen PICC placed which may cause compatibility issues with other IV medications  Previous heparin level = 0.2 on heparin 2350 units/hr  HL= 0.36 after drip @ 2700 units/hr x ~ 6 hours  Heparin level goal 0.3-0.7 Heparin dosing wt = 120kg  Plan:  Continue heparin @ 2700 units/hr  Recheck after pt back from [facility] for kyphoplasty  Lorenza Evangelist 04/15/2013  1:24 AM

## 2013-04-15 NOTE — Progress Notes (Signed)
[facility] PROGRESS NOTE  [name] AOZ:[id number] DOB: [birth year] DOA: 04/13/2013 PCP: [name], MD  Assessment/Plan  RUE DVT likely secondary to metastatic cancer and port.   - Heparin gtt on hold following drop in hemoglobin - Port removal and PICC line placement on 12/23 by IR - Elevate extremity  - Continue percocet prn pain  - Anticipate transitioning to lovenox for ongoing a/c after kyphoplasty - Pharmacist to dose by anti-Xa levels, likely will restart heparin this evening  Chronic lower extremity swelling, likely venous stasis. Will not pursue further duplex as will not change management at this time.  - Continue lasix   Metastatic endometrial cancer, appreciate oncology assistance.   Macrocytic anemia, likely due to chemotherapy + iron deficiency  Patient had large drop today down to 5.0. No corresponding drop in blood pressure, so doubt that this is acute bleeding, however having been started on anticoagulation, will need to monitor closely. Transfusing 2 units packed red blood cells we'll continue to follow hemoglobin. He Hemoccult stool. Agree with hematology that likely this may be more related to her chemotherapy - Vit B12 and folate wnl one month ago  - Continue iron supplementation   L1/L2 compression fractures with ongoing back pain making ambulation difficult -  Kyphoplasty canceled this morning because of drop in hemoglobin   Hypokalemia, due to lasix -  Oral potassium supplement   Diet: Regular Access: PIV - do not use port  IVF: off  Proph: Heparin gtt   Code Status: full code  Family Communication: patient and her mother  Disposition Plan:  Follow hemoglobin levels tomorrow. If stable, will discuss with interventional radiology about eating kyphoplasty done as soon as they are able to, Friday at the earliest   Consultants:  Oncology  Interventional radiology  Procedures:  Heparin gtt  Duplex upper extremity  Port removal  and picc placement 12/23  Antibiotics:  none   HPI/Subjective: Patient doing okay. Some mild back pain with position, however pain overall well-controlled. Understands about to leave for kyphoplasty. Denies any dizziness or episodes of bleeding  Objective: Filed Vitals:   04/15/13 1202 04/15/13 1302 04/15/13 1416 04/15/13 1500  BP: 125/81 141/76 108/71 113/74  Pulse: 91 96 96 97  Temp: 97.7 F (36.5 C) 97.6 F (36.4 C) 98 F (36.7 C) 98.2 F (36.8 C)  TempSrc: Oral Oral Oral Oral  Resp: 28 21 20    Height:      Weight:      SpO2: 100% 97% 96% 96%    Intake/Output Summary (Last 24 hours) at 04/15/13 1555 Last data filed at 04/15/13 1500  Gross per 24 hour  Intake   1418 ml  Output      0 ml  Net   1418 ml   Filed Weights   04/13/13 1300  Weight: 192.6 kg (424 lb 9.7 oz)    Exam:   General:  Obese Caucasian female, No acute distress  HEENT:  Normocephalic, atraumatic, mucous membranes are moist  Cardiovascular:  RRR, nl S1, S2   Respiratory:  Clear to auscultation bilaterally, decreased breath sounds throughout secondary to body habitus  Abdomen:   Soft, nontender, morbidly obese, positive bowel sounds  MSK:   1+ bilateral lower extremities edema, left hand mildly puffy, 1+ pitting edema, right upper extremity with 3+ pitting edema and persistent mild warmth and injection  Data Reviewed: Basic Metabolic Panel:  Recent Labs Lab 04/13/13 1225 04/14/13 0545 04/15/13 0425  NA 138 136 127*  K 3.8 3.4*  2.7*  CL 99 98 95*  CO2 29 29 23   GLUCOSE 103* 89 96  BUN 7 8 6   CREATININE 0.61 0.61 0.49*  CALCIUM 8.9 8.4 6.6*   Liver Function Tests: No results found for this basename: AST, ALT, ALKPHOS, BILITOT, PROT, ALBUMIN,  in the last 168 hours No results found for this basename: LIPASE, AMYLASE,  in the last 168 hours No results found for this basename: AMMONIA,  in the last 168 hours CBC:  Recent Labs Lab 04/13/13 1225 04/14/13 0545 04/15/13 0425   WBC 4.6 4.6 6.0  NEUTROABS 2.9  --   --   HGB 9.5* 8.4* 5.0*  HCT 31.3* 28.0* 16.4*  MCV 108.7* 106.9* 108.6*  PLT 166 146* 154   Cardiac Enzymes: No results found for this basename: CKTOTAL, CKMB, CKMBINDEX, TROPONINI,  in the last 168 hours BNP (last 3 results)  Recent Labs  03/12/13 0845  PROBNP 2322.0*   CBG: No results found for this basename: GLUCAP,  in the last 168 hours  No results found for this or any previous visit (from the past 240 hour(s)).   Studies: Ir Fluoro Guide Cv Line Left  04/14/2013   EXAM: 1.  ULTRASOUND AND FLUOROSCOPIC GUIDED PICC LINE INSERTION  2.  PORT A CATHETER REMOVAL  MEDICATIONS: Fentanyl 50 mcg IV; Ancef 3 g IV, the antibiotics were administered within an appropriate time frame prior to the initiation of the procedure.  Sedation time: 45 min  TECHNIQUE: The procedure, risks, benefits, and alternatives were explained to the patient and informed written consent was obtained. A timeout was performed prior to the initiation of the procedure.  The left upper extremity was prepped with chlorhexidine in a sterile fashion, and a sterile drape was applied covering the operative field. Maximum barrier sterile technique with sterile gowns and gloves were used for the procedure. A timeout was performed prior to the initiation of the procedure. Local anesthesia was provided with 1% lidocaine.  Under direct ultrasound guidance, the left cephalic vein was accessed with a micropuncture kit after the overlying soft tissues were anesthetized with 1% lidocaine. An ultrasound image was saved for documentation purposes. A guidewire was advanced to the level of the superior caval-atrial junction for measurement purposes and the PICC line was cut to length. A peel-away sheath was placed and a 55 cm, 5 [place], single lumen was inserted to level of the superior caval-atrial junction. A post procedure spot fluoroscopic was obtained. The catheter easily aspirated and flushed and  was sutured in place. A dressing was placed.  Attention was now paid to towards removal of the right anterior chest wall Port a Catheter. The right chest Port-A-Cath site was prepped with chlorhexidine. A sterile gown and gloves were worn during the procedure. Local anesthesia was provided with 1% lidocaine with epinephrine.  An incision was made overlying the Port-A-Cath with a #15 scalpel. Utilizing sharp and blunt dissection, the Port-A-Cath was removed completely. The pocked was irrigated with sterile saline. Wound closure was performed with subcutaneous 3-0 Monocryl, subcuticular 4-0 Vicryl and Dermabond. A dressing was placed. The patient tolerated the procedure well without immediate post procedural complication.  CONTRAST:  None  FLUOROSCOPY TIME:  2 minutes, 36 seconds.  COMPLICATIONS: None immediate  FINDINGS: After PICC placement, the tip lies within the superior cavoatrial junction. The catheter aspirates and flushes normally and is ready for immediate use.  IMPRESSION: 1. Successful ultrasound and fluoroscopic guided placement of a left cephalic vein approach, 55 cm, 5 [place],  single lumen PICC with tip at the superior caval-atrial junction. The PICC line is ready for immediate use. 2. Successful removal of right anterior chest wall Port a Catheter.  INDICATION: History of metastatic endometrial cancer, post ultrasound and fluoroscopic guided Port a Catheter placement -10/31/2012, now with right upper extremity DVT. Please removed existing port-a-catheter and place left upper extremity approach PICC line for continued venous access.   Electronically Signed   By: Simonne Come M.D.   On: 04/14/2013 16:29   Ir Removal [employer] Access W/ [employer] W/o Fl Mod Sed  04/14/2013   EXAM: 1.  ULTRASOUND AND FLUOROSCOPIC GUIDED PICC LINE INSERTION  2.  PORT A CATHETER REMOVAL  MEDICATIONS: Fentanyl 50 mcg IV; Ancef 3 g IV, the antibiotics were administered within an appropriate time frame prior to the initiation of the  procedure.  Sedation time: 45 min  TECHNIQUE: The procedure, risks, benefits, and alternatives were explained to the patient and informed written consent was obtained. A timeout was performed prior to the initiation of the procedure.  The left upper extremity was prepped with chlorhexidine in a sterile fashion, and a sterile drape was applied covering the operative field. Maximum barrier sterile technique with sterile gowns and gloves were used for the procedure. A timeout was performed prior to the initiation of the procedure. Local anesthesia was provided with 1% lidocaine.  Under direct ultrasound guidance, the left cephalic vein was accessed with a micropuncture kit after the overlying soft tissues were anesthetized with 1% lidocaine. An ultrasound image was saved for documentation purposes. A guidewire was advanced to the level of the superior caval-atrial junction for measurement purposes and the PICC line was cut to length. A peel-away sheath was placed and a 55 cm, 5 [place], single lumen was inserted to level of the superior caval-atrial junction. A post procedure spot fluoroscopic was obtained. The catheter easily aspirated and flushed and was sutured in place. A dressing was placed.  Attention was now paid to towards removal of the right anterior chest wall Port a Catheter. The right chest Port-A-Cath site was prepped with chlorhexidine. A sterile gown and gloves were worn during the procedure. Local anesthesia was provided with 1% lidocaine with epinephrine.  An incision was made overlying the Port-A-Cath with a #15 scalpel. Utilizing sharp and blunt dissection, the Port-A-Cath was removed completely. The pocked was irrigated with sterile saline. Wound closure was performed with subcutaneous 3-0 Monocryl, subcuticular 4-0 Vicryl and Dermabond. A dressing was placed. The patient tolerated the procedure well without immediate post procedural complication.  CONTRAST:  None  FLUOROSCOPY TIME:  2 minutes, 36  seconds.  COMPLICATIONS: None immediate  FINDINGS: After PICC placement, the tip lies within the superior cavoatrial junction. The catheter aspirates and flushes normally and is ready for immediate use.  IMPRESSION: 1. Successful ultrasound and fluoroscopic guided placement of a left cephalic vein approach, 55 cm, 5 [place], single lumen PICC with tip at the superior caval-atrial junction. The PICC line is ready for immediate use. 2. Successful removal of right anterior chest wall Port a Catheter.  INDICATION: History of metastatic endometrial cancer, post ultrasound and fluoroscopic guided Port a Catheter placement -10/31/2012, now with right upper extremity DVT. Please removed existing port-a-catheter and place left upper extremity approach PICC line for continued venous access.   Electronically Signed   By: Simonne Come M.D.   On: 04/14/2013 16:29   Ir US Guide Vasc Access Left  04/14/2013   EXAM: 1.  ULTRASOUND AND FLUOROSCOPIC GUIDED  PICC LINE INSERTION  2.  PORT A CATHETER REMOVAL  MEDICATIONS: Fentanyl 50 mcg IV; Ancef 3 g IV, the antibiotics were administered within an appropriate time frame prior to the initiation of the procedure.  Sedation time: 45 min  TECHNIQUE: The procedure, risks, benefits, and alternatives were explained to the patient and informed written consent was obtained. A timeout was performed prior to the initiation of the procedure.  The left upper extremity was prepped with chlorhexidine in a sterile fashion, and a sterile drape was applied covering the operative field. Maximum barrier sterile technique with sterile gowns and gloves were used for the procedure. A timeout was performed prior to the initiation of the procedure. Local anesthesia was provided with 1% lidocaine.  Under direct ultrasound guidance, the left cephalic vein was accessed with a micropuncture kit after the overlying soft tissues were anesthetized with 1% lidocaine. An ultrasound image was saved for documentation  purposes. A guidewire was advanced to the level of the superior caval-atrial junction for measurement purposes and the PICC line was cut to length. A peel-away sheath was placed and a 55 cm, 5 [place], single lumen was inserted to level of the superior caval-atrial junction. A post procedure spot fluoroscopic was obtained. The catheter easily aspirated and flushed and was sutured in place. A dressing was placed.  Attention was now paid to towards removal of the right anterior chest wall Port a Catheter. The right chest Port-A-Cath site was prepped with chlorhexidine. A sterile gown and gloves were worn during the procedure. Local anesthesia was provided with 1% lidocaine with epinephrine.  An incision was made overlying the Port-A-Cath with a #15 scalpel. Utilizing sharp and blunt dissection, the Port-A-Cath was removed completely. The pocked was irrigated with sterile saline. Wound closure was performed with subcutaneous 3-0 Monocryl, subcuticular 4-0 Vicryl and Dermabond. A dressing was placed. The patient tolerated the procedure well without immediate post procedural complication.  CONTRAST:  None  FLUOROSCOPY TIME:  2 minutes, 36 seconds.  COMPLICATIONS: None immediate  FINDINGS: After PICC placement, the tip lies within the superior cavoatrial junction. The catheter aspirates and flushes normally and is ready for immediate use.  IMPRESSION: 1. Successful ultrasound and fluoroscopic guided placement of a left cephalic vein approach, 55 cm, 5 [place], single lumen PICC with tip at the superior caval-atrial junction. The PICC line is ready for immediate use. 2. Successful removal of right anterior chest wall Port a Catheter.  INDICATION: History of metastatic endometrial cancer, post ultrasound and fluoroscopic guided Port a Catheter placement -10/31/2012, now with right upper extremity DVT. Please removed existing port-a-catheter and place left upper extremity approach PICC line for continued venous access.    Electronically Signed   By: Simonne Come M.D.   On: 04/14/2013 16:29    Scheduled Meds: . dextromethorphan-guaiFENesin  1 tablet Oral BID  . enoxaparin   Does not apply Once  . ferrous sulfate  325 mg Oral TID WC  . furosemide  20 mg Oral QODAY  . miconazole nitrate  1 application Topical QID  . multivitamin with minerals  1 tablet Oral Daily  . nystatin ointment  1 application Topical BID  . pantoprazole  40 mg Oral Daily  . potassium chloride  40 mEq Oral BID  . sodium chloride  3 mL Intravenous Q12H  . sucralfate  1 g Oral QID   Continuous Infusions:   Active Problems:   Obesity - BMI 79   Bright red rectal bleeding   Endometrial cancer  Acute respiratory failure with hypoxia   OSA (obstructive sleep apnea)   DVT of upper extremity (deep vein thrombosis)   Compression fracture of L1 lumbar vertebra   Compression fracture of L2    Time spent: 30 min    Hollice Espy  [facility] Pager [phone]. If 7PM-7AM, please contact night-coverage at [email or web address], password [facility] 04/15/2013, 3:55 PM  LOS: 2 days

## 2013-04-15 NOTE — Progress Notes (Addendum)
Pt on schedule for VP of L1 and L2 fx. However, Hgb dropped to 5.0 from 8.4 Source unknown at present. Pt feels ok otherwise, back pain is tolerable when resting in bed.  BP 115/72  Pulse 90  Temp(Src) 98 F (36.7 C) (Oral)  Resp 17  Ht 5' 10.87" (1.8 m)  Wt 424 lb 9.7 oz (192.6 kg)  BMI 59.44 kg/m2  SpO2 95% Port removal site clean, no swelling, ecchymosis or hematoma PICC site clean, no swelling.  IR VP procedure on hold at present time. Will follow clinical progress and hopefully can reschedule procedure.  Brayton El PA-C Interventional Radiology 04/15/2013 8:44 AM'

## 2013-04-15 NOTE — Progress Notes (Signed)
Physical Therapy Discharge Patient Details Name: [name] MRN: [id number] DOB: 08-22-1965 Today's Date: 04/15/2013 Time:  -     Patient discharged from PT services secondary to surgery - and other medical issues.  Please  re-order PT to resume therapy services.  Please see latest therapy progress note for current level of functioning and progress toward goals.     GP     Sharen Heck PT [phone]  04/15/2013, 7:19 AM

## 2013-04-15 NOTE — Progress Notes (Signed)
[name]   DOB:May 24, 1965   WN#:[id number]   QIH#:[id number]  Subjective: [name] is having breakfast in bed with three family members helping her out. "They think I'm helpless." She tells me port removal and PICC line placement went well, and she feels her fingers are a little less swollen already. Kyphoplasty unfortunately had to be postponed due to drop in pt's Hb. Aunt and mother and third relative in room   Objective: young White woman examined in bed Filed Vitals:   04/15/13 0600  BP: 115/72  Pulse: 90  Temp: 98 F (36.7 C)  Resp: 17    Body mass index is 59.44 kg/(m^2). No intake or output data in the 24 hours ending 04/15/13 0857  Physical exam not repeated  Sclerae unicteric  No cervical adenopathy  Lungs clear -- auscultated anterolaterally  Heart regular rate and rhythm  Abdomen obese, NT, +BS  MSK RUE grade 2 lymphedema, with mottled erythema  Neuro alert, well-oriented, pleasant affect  Breast exam: deferred  CBG (last 3)  No results found for this basename: GLUCAP,  in the last 72 hours   Labs:  Lab Results  Component Value Date   WBC 6.0 04/15/2013   HGB 5.0* 04/15/2013   HCT 16.4* 04/15/2013   MCV 108.6* 04/15/2013   PLT 154 04/15/2013   NEUTROABS 2.9 04/13/2013    @LASTCHEMISTRY @  Urine Studies No results found for this basename: UACOL, UAPR, USPG, UPH, UTP, UGL, UKET, UBIL, UHGB, UNIT, UROB, ULEU, UEPI, UWBC, URBC, UBAC, CAST, CRYS, UCOM, BILUA,  in the last 72 hours  Basic Metabolic Panel:  Recent Labs Lab 04/13/13 1225 04/14/13 0545 04/15/13 0425  NA 138 136 127*  K 3.8 3.4* 2.7*  CL 99 98 95*  CO2 29 29 23   GLUCOSE 103* 89 96  BUN 7 8 6   CREATININE 0.61 0.61 0.49*  CALCIUM 8.9 8.4 6.6*   GFR Estimated Creatinine Clearance: 167.2 ml/min (by C-G formula based on Cr of 0.49). Liver Function Tests: No results found for this basename: AST, ALT, ALKPHOS, BILITOT, PROT, ALBUMIN,  in the last 168 hours No results found for this basename:  LIPASE, AMYLASE,  in the last 168 hours No results found for this basename: AMMONIA,  in the last 168 hours Coagulation profile  Recent Labs Lab 04/13/13 1430  INR 0.99    CBC:  Recent Labs Lab 04/13/13 1225 04/14/13 0545 04/15/13 0425  WBC 4.6 4.6 6.0  NEUTROABS 2.9  --   --   HGB 9.5* 8.4* 5.0*  HCT 31.3* 28.0* 16.4*  MCV 108.7* 106.9* 108.6*  PLT 166 146* 154   Cardiac Enzymes: No results found for this basename: CKTOTAL, CKMB, CKMBINDEX, TROPONINI,  in the last 168 hours BNP: No components found with this basename: POCBNP,  CBG: No results found for this basename: GLUCAP,  in the last 168 hours D-Dimer No results found for this basename: DDIMER,  in the last 72 hours Hgb A1c No results found for this basename: HGBA1C,  in the last 72 hours Lipid Profile No results found for this basename: CHOL, HDL, LDLCALC, TRIG, CHOLHDL, LDLDIRECT,  in the last 72 hours Thyroid function studies No results found for this basename: TSH, T4TOTAL, FREET3, T3FREE, THYROIDAB,  in the last 72 hours Anemia work up No results found for this basename: VITAMINB12, FOLATE, FERRITIN, TIBC, IRON, RETICCTPCT,  in the last 72 hours Microbiology No results found for this or any previous visit (from the past 240 hour(s)).    Studies:  Ir Fluoro Guide Cv Line Left  04/14/2013   EXAM: 1.  ULTRASOUND AND FLUOROSCOPIC GUIDED PICC LINE INSERTION  2.  PORT A CATHETER REMOVAL  MEDICATIONS: Fentanyl 50 mcg IV; Ancef 3 g IV, the antibiotics were administered within an appropriate time frame prior to the initiation of the procedure.  Sedation time: 45 min  TECHNIQUE: The procedure, risks, benefits, and alternatives were explained to the patient and informed written consent was obtained. A timeout was performed prior to the initiation of the procedure.  The left upper extremity was prepped with chlorhexidine in a sterile fashion, and a sterile drape was applied covering the operative field. Maximum barrier  sterile technique with sterile gowns and gloves were used for the procedure. A timeout was performed prior to the initiation of the procedure. Local anesthesia was provided with 1% lidocaine.  Under direct ultrasound guidance, the left cephalic vein was accessed with a micropuncture kit after the overlying soft tissues were anesthetized with 1% lidocaine. An ultrasound image was saved for documentation purposes. A guidewire was advanced to the level of the superior caval-atrial junction for measurement purposes and the PICC line was cut to length. A peel-away sheath was placed and a 55 cm, 5 [place], single lumen was inserted to level of the superior caval-atrial junction. A post procedure spot fluoroscopic was obtained. The catheter easily aspirated and flushed and was sutured in place. A dressing was placed.  Attention was now paid to towards removal of the right anterior chest wall Port a Catheter. The right chest Port-A-Cath site was prepped with chlorhexidine. A sterile gown and gloves were worn during the procedure. Local anesthesia was provided with 1% lidocaine with epinephrine.  An incision was made overlying the Port-A-Cath with a #15 scalpel. Utilizing sharp and blunt dissection, the Port-A-Cath was removed completely. The pocked was irrigated with sterile saline. Wound closure was performed with subcutaneous 3-0 Monocryl, subcuticular 4-0 Vicryl and Dermabond. A dressing was placed. The patient tolerated the procedure well without immediate post procedural complication.  CONTRAST:  None  FLUOROSCOPY TIME:  2 minutes, 36 seconds.  COMPLICATIONS: None immediate  FINDINGS: After PICC placement, the tip lies within the superior cavoatrial junction. The catheter aspirates and flushes normally and is ready for immediate use.  IMPRESSION: 1. Successful ultrasound and fluoroscopic guided placement of a left cephalic vein approach, 55 cm, 5 [place], single lumen PICC with tip at the superior caval-atrial junction.  The PICC line is ready for immediate use. 2. Successful removal of right anterior chest wall Port a Catheter.  INDICATION: History of metastatic endometrial cancer, post ultrasound and fluoroscopic guided Port a Catheter placement -10/31/2012, now with right upper extremity DVT. Please removed existing port-a-catheter and place left upper extremity approach PICC line for continued venous access.   Electronically Signed   By: Simonne Come M.D.   On: 04/14/2013 16:29   Ir Removal [employer] Access W/ [employer] W/o Fl Mod Sed  04/14/2013   EXAM: 1.  ULTRASOUND AND FLUOROSCOPIC GUIDED PICC LINE INSERTION  2.  PORT A CATHETER REMOVAL  MEDICATIONS: Fentanyl 50 mcg IV; Ancef 3 g IV, the antibiotics were administered within an appropriate time frame prior to the initiation of the procedure.  Sedation time: 45 min  TECHNIQUE: The procedure, risks, benefits, and alternatives were explained to the patient and informed written consent was obtained. A timeout was performed prior to the initiation of the procedure.  The left upper extremity was prepped with chlorhexidine in a sterile fashion, and a  sterile drape was applied covering the operative field. Maximum barrier sterile technique with sterile gowns and gloves were used for the procedure. A timeout was performed prior to the initiation of the procedure. Local anesthesia was provided with 1% lidocaine.  Under direct ultrasound guidance, the left cephalic vein was accessed with a micropuncture kit after the overlying soft tissues were anesthetized with 1% lidocaine. An ultrasound image was saved for documentation purposes. A guidewire was advanced to the level of the superior caval-atrial junction for measurement purposes and the PICC line was cut to length. A peel-away sheath was placed and a 55 cm, 5 [place], single lumen was inserted to level of the superior caval-atrial junction. A post procedure spot fluoroscopic was obtained. The catheter easily aspirated and flushed and was  sutured in place. A dressing was placed.  Attention was now paid to towards removal of the right anterior chest wall Port a Catheter. The right chest Port-A-Cath site was prepped with chlorhexidine. A sterile gown and gloves were worn during the procedure. Local anesthesia was provided with 1% lidocaine with epinephrine.  An incision was made overlying the Port-A-Cath with a #15 scalpel. Utilizing sharp and blunt dissection, the Port-A-Cath was removed completely. The pocked was irrigated with sterile saline. Wound closure was performed with subcutaneous 3-0 Monocryl, subcuticular 4-0 Vicryl and Dermabond. A dressing was placed. The patient tolerated the procedure well without immediate post procedural complication.  CONTRAST:  None  FLUOROSCOPY TIME:  2 minutes, 36 seconds.  COMPLICATIONS: None immediate  FINDINGS: After PICC placement, the tip lies within the superior cavoatrial junction. The catheter aspirates and flushes normally and is ready for immediate use.  IMPRESSION: 1. Successful ultrasound and fluoroscopic guided placement of a left cephalic vein approach, 55 cm, 5 [place], single lumen PICC with tip at the superior caval-atrial junction. The PICC line is ready for immediate use. 2. Successful removal of right anterior chest wall Port a Catheter.  INDICATION: History of metastatic endometrial cancer, post ultrasound and fluoroscopic guided Port a Catheter placement -10/31/2012, now with right upper extremity DVT. Please removed existing port-a-catheter and place left upper extremity approach PICC line for continued venous access.   Electronically Signed   By: Simonne Come M.D.   On: 04/14/2013 16:29   Ir US Guide Vasc Access Left  04/14/2013   EXAM: 1.  ULTRASOUND AND FLUOROSCOPIC GUIDED PICC LINE INSERTION  2.  PORT A CATHETER REMOVAL  MEDICATIONS: Fentanyl 50 mcg IV; Ancef 3 g IV, the antibiotics were administered within an appropriate time frame prior to the initiation of the procedure.  Sedation  time: 45 min  TECHNIQUE: The procedure, risks, benefits, and alternatives were explained to the patient and informed written consent was obtained. A timeout was performed prior to the initiation of the procedure.  The left upper extremity was prepped with chlorhexidine in a sterile fashion, and a sterile drape was applied covering the operative field. Maximum barrier sterile technique with sterile gowns and gloves were used for the procedure. A timeout was performed prior to the initiation of the procedure. Local anesthesia was provided with 1% lidocaine.  Under direct ultrasound guidance, the left cephalic vein was accessed with a micropuncture kit after the overlying soft tissues were anesthetized with 1% lidocaine. An ultrasound image was saved for documentation purposes. A guidewire was advanced to the level of the superior caval-atrial junction for measurement purposes and the PICC line was cut to length. A peel-away sheath was placed and a 55 cm, 5 [place],  single lumen was inserted to level of the superior caval-atrial junction. A post procedure spot fluoroscopic was obtained. The catheter easily aspirated and flushed and was sutured in place. A dressing was placed.  Attention was now paid to towards removal of the right anterior chest wall Port a Catheter. The right chest Port-A-Cath site was prepped with chlorhexidine. A sterile gown and gloves were worn during the procedure. Local anesthesia was provided with 1% lidocaine with epinephrine.  An incision was made overlying the Port-A-Cath with a #15 scalpel. Utilizing sharp and blunt dissection, the Port-A-Cath was removed completely. The pocked was irrigated with sterile saline. Wound closure was performed with subcutaneous 3-0 Monocryl, subcuticular 4-0 Vicryl and Dermabond. A dressing was placed. The patient tolerated the procedure well without immediate post procedural complication.  CONTRAST:  None  FLUOROSCOPY TIME:  2 minutes, 36 seconds.   COMPLICATIONS: None immediate  FINDINGS: After PICC placement, the tip lies within the superior cavoatrial junction. The catheter aspirates and flushes normally and is ready for immediate use.  IMPRESSION: 1. Successful ultrasound and fluoroscopic guided placement of a left cephalic vein approach, 55 cm, 5 [place], single lumen PICC with tip at the superior caval-atrial junction. The PICC line is ready for immediate use. 2. Successful removal of right anterior chest wall Port a Catheter.  INDICATION: History of metastatic endometrial cancer, post ultrasound and fluoroscopic guided Port a Catheter placement -10/31/2012, now with right upper extremity DVT. Please removed existing port-a-catheter and place left upper extremity approach PICC line for continued venous access.   Electronically Signed   By: Simonne Come M.D.   On: 04/14/2013 16:29    Assessment: 47 y.o. [name] woman admitted with   1) history of recurrent endometrial adenocarcinoma initially diagnosed in March of 2009. Robotic hysterectomy bilateral salpingo-oophorectomy with minilaparotomy for removal of the specimen was performed in September 2011. 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 Ms Wolz is currently day 14 cycle 7 of 8 planned cycles of carboplatin and paclitaxel  2) right UE DVT, port-associated, with grade 2 right UE lymphedema; s/p port removal 04/14/2013 w PICC placement for access; on IV heparin until kyphoplasty, at which time she will be switched to BID lovenox  3) poorly controlled pain/ bedbound due to lumbar compression fractures; history of prior T10-T11 kyphoplasty per IR (Devashwar); hoping for lumbar kyphplasties before discharge  4) severe anemia, partly due to chemotherapy, partly possibly to yesterday's procedure; being transfused 04/15/2013   Plan:  Normally we would proceed  now to lovenox at 1 mg/kg BID and check factor 10 activity after 3 doses. However, the patient is interested in 2 procedures, (1) kyphoplasty of lumbar fractures which she hopes will relieve her pain sufficiently that she will be able to ambulate again, and (2) port removal, which may help relieve the upper extremity lymphedema. Per IR, they may be able to do the port removal today and the kyphoplasty tomorrow (after review of outside films).  As noted before, we suggest: -- continue IV heparin until both procedures completed (IR will work with pharmacy to interrupt the heparin drip as needed to minimize bleeding complications) -- after procedures completed, start lovenox at 1 mg/kg sQ BID and check factor 10 activity 4 hours after 3d dose  Patient will need HHN support at home but she is able to self-inject with lovenox.   I will be out of town until 04/20/2013 and will  see patient than if she is still in the hospital. If there are other concerns please contact my partners on call. Laurine has appointment at the [facility] 04/22/2013 at 1:30 PM--she is aware.  Greatly appreciate hospitalists' help to this patient!   Lowella Dell, MD 04/15/2013  8:57 AM

## 2013-04-15 NOTE — Progress Notes (Signed)
OT Cancellation Note/Discharge  Patient Details Name: JACQULYN BARRESI MRN: [id number] DOB: 04/03/1966   Cancelled Treatment:    Reason Eval/Treat Not Completed: Medical issues which prohibited therapy. Pt is awaiting KP and has decreased HgB.  Please reorder OT when pt can tolerate.  Thank you.    Amorie Rentz 04/15/2013, 11:07 AM Marica Otter, OTR/L [phone] 04/15/2013

## 2013-04-16 DIAGNOSIS — E669 Obesity, unspecified: Secondary | ICD-10-CM

## 2013-04-16 LAB — TYPE AND SCREEN
ABO/RH(D): A NEG
Antibody Screen: NEGATIVE
Unit division: 0
Unit division: 0

## 2013-04-16 LAB — BASIC METABOLIC PANEL
BUN: 6 mg/dL (ref 6–23)
CO2: 27 mEq/L (ref 19–32)
Chloride: 103 mEq/L (ref 96–112)
GFR calc Af Amer: >90 mL/min (ref >90)
GFR calc non Af Amer: >90 mL/min (ref >90)
Potassium: 4 mEq/L (ref 3.5–5.1)
Sodium: 139 mEq/L (ref 135–145)

## 2013-04-16 LAB — CBC
HCT: 29.9 % — ABNORMAL LOW (ref 36.0–46.0)
Hemoglobin: 9.1 g/dL — ABNORMAL LOW (ref 12.0–15.0)
RDW: 21.8 % — ABNORMAL HIGH (ref 11.5–15.5)
WBC: 7.2 10*3/uL (ref 4.0–10.5)

## 2013-04-16 MED ORDER — ENOXAPARIN SODIUM 150 MG/ML ~~LOC~~ SOLN
190.0000 mg | Freq: Two times a day (BID) | SUBCUTANEOUS | Status: DC
  Administered 2013-04-16 – 2013-04-17 (×2): 190 mg via SUBCUTANEOUS
  Filled 2013-04-16 (×4): qty 2

## 2013-04-16 MED ORDER — HEPARIN (PORCINE) IN NACL 100-0.45 UNIT/ML-% IJ SOLN
2700.0000 [IU]/h | INTRAMUSCULAR | Status: AC
  Administered 2013-04-16: 2700 [IU]/h via INTRAVENOUS
  Filled 2013-04-16 (×2): qty 250

## 2013-04-16 NOTE — Progress Notes (Signed)
Patient states that she is able to give her own lovenox as she has done this in the past but the right arm discomfort prevents her to administer the injection tonight.  Instructed her mother and aunt in giving the lovenox,  The aunt administered appropriately and feels comfortable that she is capable to do this at home for the patient.

## 2013-04-16 NOTE — Progress Notes (Signed)
[facility] PROGRESS NOTE  [name] ZOX:[id number] DOB: February 15, 1966 DOA: 04/13/2013 PCP: [name], MD  Assessment/Plan  RUE DVT likely secondary to metastatic cancer and port.   - Heparin gtt on hold following drop in hemoglobin - Port removal and PICC line placement on 12/23 by IR - Elevate extremity  - Continue percocet prn pain  Since kyphoplasty was not able to be done until next week, has started Lovenox the plan to discharge home on Lovenox  Chronic lower extremity swelling, likely venous stasis. Will not pursue further duplex as will not change management at this time.  - Continue lasix   Metastatic endometrial cancer, appreciate oncology assistance.   Macrocytic anemia, likely due to chemotherapy + iron deficiency  Patient had large drop today down to 5.0. No corresponding drop in blood pressure, so doubt that this is acute bleeding, however having been started on anticoagulation, will need to monitor closely. Heparin restarted and now changed to Lovenox. Hemoglobin after 2 units packed red blood cells at 9. No evidence of bleed. Agree with hematology that likely this may be more related to her chemotherapy - Vit B12 and folate wnl one month ago  - Continue iron supplementation   L1/L2 compression fractures with ongoing back pain making ambulation difficult Kyphoplasty canceled on 12/24 secondary to acute hemoglobin drop. Although that is now stabilized, interventional radiologist is not available for kyphoplasty until 1/2. Have discussed with patient and will plan to discharge home tomorrow an outpatient procedure   Hypokalemia, due to lasix -  Oral potassium supplement   Diet: Regular Access: PIV - do not use port  IVF: off  Proph: Heparin gtt   Code Status: full code  Family Communication: patient and her mother  Disposition Plan:  Discharge home tomorrow with outpatient interventional radiology next  week   Consultants:  Oncology  Interventional radiology  Procedures:  Heparin gtt  Duplex upper extremity  Port removal and picc placement 12/23  Antibiotics:  none   HPI/Subjective: Patient doing okay. The back pain to  Objective: Filed Vitals:   04/15/13 1920 04/15/13 1930 04/15/13 2009 04/16/13 0550  BP: 131/71 131/71 116/72 119/73  Pulse: 99 99 98 92  Temp: 98.4 F (36.9 C)  98.4 F (36.9 C) 97.9 F (36.6 C)  TempSrc: Oral Oral Oral Oral  Resp: 18 18 16 18   Height:      Weight:      SpO2: 97% 97% 93% 94%    Intake/Output Summary (Last 24 hours) at 04/16/13 8119 Last data filed at 04/15/13 1715  Gross per 24 hour  Intake 2002.5 ml  Output      0 ml  Net 2002.5 ml   Filed Weights   04/13/13 1300  Weight: 192.6 kg (424 lb 9.7 oz)    Exam:   General:  Obese Caucasian female, No acute distress  HEENT:  Normocephalic, atraumatic, mucous membranes are moist  Cardiovascular:  RRR, nl S1, S2   Respiratory:  Clear to auscultation bilaterally, decreased breath sounds throughout secondary to body habitus  Abdomen:   Soft, nontender, morbidly obese, positive bowel sounds  MSK:   1+ bilateral lower extremities edema, left hand mildly puffy, 1+ pitting edema, right upper extremity with 3+ pitting edema and persistent mild warmth and injection  Data Reviewed: Basic Metabolic Panel:  Recent Labs Lab 04/13/13 1225 04/14/13 0545 04/15/13 0425 04/16/13 0120  NA 138 136 127* 139  K 3.8 3.4* 2.7* 4.0  CL 99 98 95* 103  CO2  29 29 23 27   GLUCOSE 103* 89 96 115*  BUN 7 8 6 6   CREATININE 0.61 0.61 0.49* 0.62  CALCIUM 8.9 8.4 6.6* 8.4   Liver Function Tests: No results found for this basename: AST, ALT, ALKPHOS, BILITOT, PROT, ALBUMIN,  in the last 168 hours No results found for this basename: LIPASE, AMYLASE,  in the last 168 hours No results found for this basename: AMMONIA,  in the last 168 hours CBC:  Recent Labs Lab 04/13/13 1225  04/14/13 0545 04/15/13 0425 04/16/13 0120  WBC 4.6 4.6 6.0 7.2  NEUTROABS 2.9  --   --   --   HGB 9.5* 8.4* 5.0* 9.1*  HCT 31.3* 28.0* 16.4* 29.9*  MCV 108.7* 106.9* 108.6* 104.5*  PLT 166 146* 154 147*   Cardiac Enzymes: No results found for this basename: CKTOTAL, CKMB, CKMBINDEX, TROPONINI,  in the last 168 hours BNP (last 3 results)  Recent Labs  03/12/13 0845  PROBNP 2322.0*   CBG: No results found for this basename: GLUCAP,  in the last 168 hours  No results found for this or any previous visit (from the past 240 hour(s)).   Studies: Ir Fluoro Guide Cv Line Left  04/14/2013   EXAM: 1.  ULTRASOUND AND FLUOROSCOPIC GUIDED PICC LINE INSERTION  2.  PORT A CATHETER REMOVAL  MEDICATIONS: Fentanyl 50 mcg IV; Ancef 3 g IV, the antibiotics were administered within an appropriate time frame prior to the initiation of the procedure.  Sedation time: 45 min  TECHNIQUE: The procedure, risks, benefits, and alternatives were explained to the patient and informed written consent was obtained. A timeout was performed prior to the initiation of the procedure.  The left upper extremity was prepped with chlorhexidine in a sterile fashion, and a sterile drape was applied covering the operative field. Maximum barrier sterile technique with sterile gowns and gloves were used for the procedure. A timeout was performed prior to the initiation of the procedure. Local anesthesia was provided with 1% lidocaine.  Under direct ultrasound guidance, the left cephalic vein was accessed with a micropuncture kit after the overlying soft tissues were anesthetized with 1% lidocaine. An ultrasound image was saved for documentation purposes. A guidewire was advanced to the level of the superior caval-atrial junction for measurement purposes and the PICC line was cut to length. A peel-away sheath was placed and a 55 cm, 5 [place], single lumen was inserted to level of the superior caval-atrial junction. A post procedure  spot fluoroscopic was obtained. The catheter easily aspirated and flushed and was sutured in place. A dressing was placed.  Attention was now paid to towards removal of the right anterior chest wall Port a Catheter. The right chest Port-A-Cath site was prepped with chlorhexidine. A sterile gown and gloves were worn during the procedure. Local anesthesia was provided with 1% lidocaine with epinephrine.  An incision was made overlying the Port-A-Cath with a #15 scalpel. Utilizing sharp and blunt dissection, the Port-A-Cath was removed completely. The pocked was irrigated with sterile saline. Wound closure was performed with subcutaneous 3-0 Monocryl, subcuticular 4-0 Vicryl and Dermabond. A dressing was placed. The patient tolerated the procedure well without immediate post procedural complication.  CONTRAST:  None  FLUOROSCOPY TIME:  2 minutes, 36 seconds.  COMPLICATIONS: None immediate  FINDINGS: After PICC placement, the tip lies within the superior cavoatrial junction. The catheter aspirates and flushes normally and is ready for immediate use.  IMPRESSION: 1. Successful ultrasound and fluoroscopic guided placement of a left  cephalic vein approach, 55 cm, 5 [place], single lumen PICC with tip at the superior caval-atrial junction. The PICC line is ready for immediate use. 2. Successful removal of right anterior chest wall Port a Catheter.  INDICATION: History of metastatic endometrial cancer, post ultrasound and fluoroscopic guided Port a Catheter placement -10/31/2012, now with right upper extremity DVT. Please removed existing port-a-catheter and place left upper extremity approach PICC line for continued venous access.   Electronically Signed   By: Simonne Come M.D.   On: 04/14/2013 16:29   Ir Removal [employer] Access W/ [employer] W/o Fl Mod Sed  04/14/2013   EXAM: 1.  ULTRASOUND AND FLUOROSCOPIC GUIDED PICC LINE INSERTION  2.  PORT A CATHETER REMOVAL  MEDICATIONS: Fentanyl 50 mcg IV; Ancef 3 g IV, the antibiotics were  administered within an appropriate time frame prior to the initiation of the procedure.  Sedation time: 45 min  TECHNIQUE: The procedure, risks, benefits, and alternatives were explained to the patient and informed written consent was obtained. A timeout was performed prior to the initiation of the procedure.  The left upper extremity was prepped with chlorhexidine in a sterile fashion, and a sterile drape was applied covering the operative field. Maximum barrier sterile technique with sterile gowns and gloves were used for the procedure. A timeout was performed prior to the initiation of the procedure. Local anesthesia was provided with 1% lidocaine.  Under direct ultrasound guidance, the left cephalic vein was accessed with a micropuncture kit after the overlying soft tissues were anesthetized with 1% lidocaine. An ultrasound image was saved for documentation purposes. A guidewire was advanced to the level of the superior caval-atrial junction for measurement purposes and the PICC line was cut to length. A peel-away sheath was placed and a 55 cm, 5 [place], single lumen was inserted to level of the superior caval-atrial junction. A post procedure spot fluoroscopic was obtained. The catheter easily aspirated and flushed and was sutured in place. A dressing was placed.  Attention was now paid to towards removal of the right anterior chest wall Port a Catheter. The right chest Port-A-Cath site was prepped with chlorhexidine. A sterile gown and gloves were worn during the procedure. Local anesthesia was provided with 1% lidocaine with epinephrine.  An incision was made overlying the Port-A-Cath with a #15 scalpel. Utilizing sharp and blunt dissection, the Port-A-Cath was removed completely. The pocked was irrigated with sterile saline. Wound closure was performed with subcutaneous 3-0 Monocryl, subcuticular 4-0 Vicryl and Dermabond. A dressing was placed. The patient tolerated the procedure well without immediate post  procedural complication.  CONTRAST:  None  FLUOROSCOPY TIME:  2 minutes, 36 seconds.  COMPLICATIONS: None immediate  FINDINGS: After PICC placement, the tip lies within the superior cavoatrial junction. The catheter aspirates and flushes normally and is ready for immediate use.  IMPRESSION: 1. Successful ultrasound and fluoroscopic guided placement of a left cephalic vein approach, 55 cm, 5 [place], single lumen PICC with tip at the superior caval-atrial junction. The PICC line is ready for immediate use. 2. Successful removal of right anterior chest wall Port a Catheter.  INDICATION: History of metastatic endometrial cancer, post ultrasound and fluoroscopic guided Port a Catheter placement -10/31/2012, now with right upper extremity DVT. Please removed existing port-a-catheter and place left upper extremity approach PICC line for continued venous access.   Electronically Signed   By: Simonne Come M.D.   On: 04/14/2013 16:29   Ir US Guide Vasc Access Left  04/14/2013  EXAM: 1.  ULTRASOUND AND FLUOROSCOPIC GUIDED PICC LINE INSERTION  2.  PORT A CATHETER REMOVAL  MEDICATIONS: Fentanyl 50 mcg IV; Ancef 3 g IV, the antibiotics were administered within an appropriate time frame prior to the initiation of the procedure.  Sedation time: 45 min  TECHNIQUE: The procedure, risks, benefits, and alternatives were explained to the patient and informed written consent was obtained. A timeout was performed prior to the initiation of the procedure.  The left upper extremity was prepped with chlorhexidine in a sterile fashion, and a sterile drape was applied covering the operative field. Maximum barrier sterile technique with sterile gowns and gloves were used for the procedure. A timeout was performed prior to the initiation of the procedure. Local anesthesia was provided with 1% lidocaine.  Under direct ultrasound guidance, the left cephalic vein was accessed with a micropuncture kit after the overlying soft tissues were  anesthetized with 1% lidocaine. An ultrasound image was saved for documentation purposes. A guidewire was advanced to the level of the superior caval-atrial junction for measurement purposes and the PICC line was cut to length. A peel-away sheath was placed and a 55 cm, 5 [place], single lumen was inserted to level of the superior caval-atrial junction. A post procedure spot fluoroscopic was obtained. The catheter easily aspirated and flushed and was sutured in place. A dressing was placed.  Attention was now paid to towards removal of the right anterior chest wall Port a Catheter. The right chest Port-A-Cath site was prepped with chlorhexidine. A sterile gown and gloves were worn during the procedure. Local anesthesia was provided with 1% lidocaine with epinephrine.  An incision was made overlying the Port-A-Cath with a #15 scalpel. Utilizing sharp and blunt dissection, the Port-A-Cath was removed completely. The pocked was irrigated with sterile saline. Wound closure was performed with subcutaneous 3-0 Monocryl, subcuticular 4-0 Vicryl and Dermabond. A dressing was placed. The patient tolerated the procedure well without immediate post procedural complication.  CONTRAST:  None  FLUOROSCOPY TIME:  2 minutes, 36 seconds.  COMPLICATIONS: None immediate  FINDINGS: After PICC placement, the tip lies within the superior cavoatrial junction. The catheter aspirates and flushes normally and is ready for immediate use.  IMPRESSION: 1. Successful ultrasound and fluoroscopic guided placement of a left cephalic vein approach, 55 cm, 5 [place], single lumen PICC with tip at the superior caval-atrial junction. The PICC line is ready for immediate use. 2. Successful removal of right anterior chest wall Port a Catheter.  INDICATION: History of metastatic endometrial cancer, post ultrasound and fluoroscopic guided Port a Catheter placement -10/31/2012, now with right upper extremity DVT. Please removed existing port-a-catheter and  place left upper extremity approach PICC line for continued venous access.   Electronically Signed   By: Simonne Come M.D.   On: 04/14/2013 16:29    Scheduled Meds: . dextromethorphan-guaiFENesin  1 tablet Oral BID  . enoxaparin   Does not apply Once  . ferrous sulfate  325 mg Oral TID WC  . furosemide  20 mg Oral QODAY  . miconazole nitrate  1 application Topical QID  . multivitamin with minerals  1 tablet Oral Daily  . nystatin ointment  1 application Topical BID  . pantoprazole  40 mg Oral Daily  . potassium chloride  40 mEq Oral BID  . sodium chloride  3 mL Intravenous Q12H  . sucralfate  1 g Oral QID   Continuous Infusions: . heparin      Active Problems:   Obesity - BMI  79   Bright red rectal bleeding   Endometrial cancer   Acute respiratory failure with hypoxia   OSA (obstructive sleep apnea)   DVT of upper extremity (deep vein thrombosis)   Compression fracture of L1 lumbar vertebra   Compression fracture of L2    Time spent: 15 min    Hollice Espy  [facility] Pager [phone]. If 7PM-7AM, please contact night-coverage at [email or web address], password [facility] 04/16/2013, 8:04 AM  LOS: 3 days

## 2013-04-16 NOTE — Progress Notes (Signed)
ANTICOAGULATION CONSULT NOTE - Follow Up Consult  Pharmacy Consult for Heparin Indication: RUE DVT  No Known Allergies  Patient Measurements: Height: 5' 10.87" (180 cm) Weight: 424 lb 9.7 oz (192.6 kg) IBW/kg (Calculated) : 70.49 Heparin dosing wt: 120kg  Vital Signs: Temp: 97.9 F (36.6 C) (12/25 0550) Temp src: Oral (12/25 0550) BP: 119/73 mmHg (12/25 0550) Pulse Rate: 92 (12/25 0550)  Labs:  Recent Labs  04/13/13 1430 04/13/13 2108 04/14/13 0545 04/15/13 0040 04/15/13 0425 04/16/13 0120  HGB  --   --  8.4*  --  5.0* 9.1*  HCT  --   --  28.0*  --  16.4* 29.9*  PLT  --   --  146*  --  154 147*  APTT 20*  --   --   --   --   --   LABPROT 12.9  --   --   --   --   --   INR 0.99  --   --   --   --   --   HEPARINUNFRC  --  0.12* 0.20* 0.36  --   --   CREATININE  --   --  0.61  --  0.49* 0.62    Estimated Creatinine Clearance: 167.2 ml/min (by C-G formula based on Cr of 0.62).   Medications:  Scheduled:  . dextromethorphan-guaiFENesin  1 tablet Oral BID  . enoxaparin   Does not apply Once  . ferrous sulfate  325 mg Oral TID WC  . furosemide  20 mg Oral QODAY  . miconazole nitrate  1 application Topical QID  . multivitamin with minerals  1 tablet Oral Daily  . nystatin ointment  1 application Topical BID  . pantoprazole  40 mg Oral Daily  . potassium chloride  40 mEq Oral BID  . sodium chloride  3 mL Intravenous Q12H  . sucralfate  1 g Oral QID   Infusions:   PRN: acetaminophen, cyclobenzaprine, fentaNYL, oxyCODONE, oxyCODONE-acetaminophen, promethazine, sodium chloride  Assessment: 47 y/o F admitted on 12/22 with catheter-associated RUE DVT.  Pharmacy was consulted to dose IV heparin.  Her Port-A-Cath was removed on 04/14/13 and she was scheduled to have kyphoplasty by IR in 12/24 (interrupt Heparin), then convert to Lovenox.    12/24 Hgb decreased to 5.0.   No overt bleeding noted, no source of blood loss is found.  MD discontinued heparin and ordered 2  units PRBC.  Today, 12/25, Hgb improved to 9.1 s/p transfusion.  Plt 147 (near baseline)  Rn reports no identified source of bleeding and no obvious complications.  Heparin level was last therapeutic (0.36) on Heparin 2700 units/hr (27 ml/hr)  Kyphoplasty will been rescheduled - f/u timing since heparin will need to be interrupted.  Goal of Therapy:  Heparin level 0.3-0.7 units/ml Monitor platelets by anticoagulation protocol: Yes   Plan:   Resume heparin IV infusion at 2700 units/hr (27 ml/hr)  Heparin level 6 hours after starting  Daily heparin level and CBC  Continue to monitor H&H and platelets closely.    Follow up clinical course, await plans for kyphoplasty and transition to Lovenox.  Lynann Beaver PharmD, BCPS Pager [phone] 04/16/2013 7:53 AM

## 2013-04-16 NOTE — Progress Notes (Signed)
ANTICOAGULATION CONSULT NOTE - Follow Up Consult  Pharmacy Consult for Lovenox Indication: RUE DVT  No Known Allergies  Patient Measurements: Height: 5' 10.87" (180 cm) Weight: 424 lb 9.7 oz (192.6 kg) IBW/kg (Calculated) : 70.49 Heparin dosing wt: 120kg  Vital Signs: Temp: 97.9 F (36.6 C) (12/25 1331) Temp src: Oral (12/25 1331) BP: 117/74 mmHg (12/25 1331) Pulse Rate: 92 (12/25 1331)  Labs:  Recent Labs  04/13/13 2108  04/14/13 0545 04/15/13 0040 04/15/13 0425 04/16/13 0120  HGB  --   < > 8.4*  --  5.0* 9.1*  HCT  --   --  28.0*  --  16.4* 29.9*  PLT  --   --  146*  --  154 147*  HEPARINUNFRC 0.12*  --  0.20* 0.36  --   --   CREATININE  --   --  0.61  --  0.49* 0.62  < > = values in this interval not displayed.  Estimated Creatinine Clearance: 167.2 ml/min (by C-G formula based on Cr of 0.62).   Medications:  Scheduled:  . dextromethorphan-guaiFENesin  1 tablet Oral BID  . enoxaparin   Does not apply Once  . ferrous sulfate  325 mg Oral TID WC  . furosemide  20 mg Oral QODAY  . miconazole nitrate  1 application Topical QID  . multivitamin with minerals  1 tablet Oral Daily  . nystatin ointment  1 application Topical BID  . pantoprazole  40 mg Oral Daily  . sodium chloride  3 mL Intravenous Q12H  . sucralfate  1 g Oral QID   Infusions:  . heparin 2,700 Units/hr (04/16/13 1037)   PRN: acetaminophen, cyclobenzaprine, fentaNYL, oxyCODONE, oxyCODONE-acetaminophen, promethazine, sodium chloride  Assessment: 47 y/o F admitted on 12/22 with catheter-associated RUE DVT.  Pharmacy was consulted to dose IV heparin.  Her Port-A-Cath was removed on 04/14/13 and she was scheduled to have kyphoplasty by IR in 12/24 (interrupt Heparin), then convert to Lovenox.    12/24 Hgb decreased to 5.0.   No overt bleeding noted, no source of blood loss is found.  MD discontinued heparin and ordered 2 units PRBC.  Today, 12/25, Hgb improved to 9.1 s/p transfusion.  Plt 147 (near  baseline).  Rn reports no identified source of bleeding and no obvious complications.  Initially, heparin was resumed 12/25 AM.  Plans for kyphoplasty have been delayed.  Now, transition heparin drip to Lovenox injections.   Goal of Therapy:  Anti-Xa level 0.6-1 units/ml 4hrs after LMWH dose given Monitor platelets by anticoagulation protocol: Yes   Plan:   D/C Heparin drip at 1700  At 1800 (one hour after Heparin d/c) start Lovenox 190mg  SQ q12h  Check anti-Xa level after 3rd dose.  Continue to monitor H&H and platelets closely.    Lynann Beaver PharmD, BCPS Pager [phone] 04/16/2013 2:33 PM

## 2013-04-16 NOTE — Progress Notes (Signed)
Pt looks good, feels ok. Responded well to PRBC transfusion, hgb up to 9.1 D/w Dr. Rito Ehrlich, do not feel acute bleeding.  Unfortunately, Dr. Corliss Skains is now out of town and not able to do procedure until 04/24/13. Pt agreeable to waiting. If she is stable, she could be discharged and IR will schedule her for 1/2 to be done as an outpt. She can be started on therapeutic Lovenox for DVT as long as it is held 24hrs before procedure next week. She has chemotherapy next week 12/31 and is also to have a [place] injection on 1/2, which she could receive after procedure. Pt and family agreeable to this plan. Will contact them regarding time for procedure on 1/2.   Brayton El PA-C Interventional Radiology 04/16/2013 12:03 PM

## 2013-04-17 ENCOUNTER — Other Ambulatory Visit: Payer: Self-pay | Admitting: Certified Registered Nurse Anesthetist

## 2013-04-17 DIAGNOSIS — D539 Nutritional anemia, unspecified: Secondary | ICD-10-CM | POA: Diagnosis present

## 2013-04-17 LAB — CBC
HCT: 29.7 % — ABNORMAL LOW (ref 36.0–46.0)
Hemoglobin: 9 g/dL — ABNORMAL LOW (ref 12.0–15.0)
MCHC: 30.3 g/dL (ref 30.0–36.0)
MCV: 104.9 fL — ABNORMAL HIGH (ref 78.0–100.0)
RBC: 2.83 MIL/uL — ABNORMAL LOW (ref 3.87–5.11)

## 2013-04-17 LAB — BASIC METABOLIC PANEL
BUN: 6 mg/dL (ref 6–23)
CO2: 28 mEq/L (ref 19–32)
Chloride: 102 mEq/L (ref 96–112)
Creatinine, Ser: 0.54 mg/dL (ref 0.50–1.10)
GFR calc non Af Amer: >90 mL/min (ref >90)
Glucose, Bld: 93 mg/dL (ref 70–99)
Potassium: 3.6 mEq/L (ref 3.5–5.1)
Sodium: 138 mEq/L (ref 135–145)

## 2013-04-17 MED ORDER — ENOXAPARIN SODIUM 150 MG/ML ~~LOC~~ SOLN: 190.0000 mg | Freq: Two times a day (BID) | SUBCUTANEOUS | Status: DC

## 2013-04-17 MED ORDER — HEPARIN SOD (PORK) LOCK FLUSH 100 UNIT/ML IV SOLN
250.0000 [IU] | Freq: Every day | INTRAVENOUS | Status: DC
  Filled 2013-04-17: qty 3

## 2013-04-17 MED ORDER — HEPARIN SOD (PORK) LOCK FLUSH 100 UNIT/ML IV SOLN
250.0000 [IU] | INTRAVENOUS | Status: DC | PRN
  Administered 2013-04-17: 250 [IU]
  Filled 2013-04-17: qty 3

## 2013-04-17 NOTE — Progress Notes (Signed)
Clinical Social Work Department BRIEF PSYCHOSOCIAL ASSESSMENT 04/17/2013  Patient:  [name], [name]     Account Number:  [id number]     Admit date:  04/13/2013  Clinical Social Worker:  Dennison Bulla  Date/Time:  04/17/2013 02:00 PM  Referred by:  Physician  Date Referred:  04/17/2013 Referred for  Transportation assistance   Other Referral:   Interview type:  Patient Other interview type:    PSYCHOSOCIAL DATA Living Status:  FAMILY Admitted from facility:   Level of care:   Primary support name:  Doris Primary support relationship to patient:  PARENT Degree of support available:   Strong    CURRENT CONCERNS Current Concerns  Other - See comment   Other Concerns:   Transportation home    SOCIAL WORK ASSESSMENT / PLAN CSW received referral to assist with transportation home. CSW reviewed chart and met with patient and family at bedside. CSW introduced myself and explained role.    Patient reports she lives at home with mom but needs [facility] to transport her home. Mom in room and reports once PT[facility] arrives she will leave in order to get home before them. Patient confirms address and agreeable to transport with understanding of no guarantee of payment.    Patient had questions regarding applying for disability. Patient reports she has gathered all forms but is not strong enough to complete application. CSW referred patient to Optometrist and [employer].    Patient thanked CSW for assistance. RN ready for CSW to coordinate transportation. Request [id number]. CSW is signing off.   Assessment/plan status:  Psychosocial Support/Ongoing Assessment of Needs Other assessment/ plan:   Information/referral to community resources:   Engineer, civil (consulting)    PATIENT'S/FAMILY'S RESPONSE TO PLAN OF CARE: Patient alert and oriented. Patient thanked CSW for coordinating transportation and reports she will follow up for disability.  Patient thanked CSW for time and reports no further needs at this time.       [place], [phone]

## 2013-04-17 NOTE — Discharge Summary (Signed)
Physician Discharge Summary  [name] ZOX:[id number] DOB: 11/11/1965 DOA: 04/13/2013  PCP: Catherine Rua, MD  Admit date: 04/13/2013 Discharge date: 04/17/2013  Time spent: 35 minutes  Recommendations for Outpatient Follow-up:  1. Patient will start on Lovenox 190 mg subcutaneous every 12 hours 2. Home health will check an anti-Xa Marquez tomorrow after the morning dose 3. Patient has her kyphoplasty rescheduled for Friday 04/24/13 at 9 AM 4. Patient has an appointment at the cancer center at 7:30 AM on 04/24/13 for Neulasta shot  Discharge Diagnoses:  Active Problems:   Obesity - BMI 79 Macrocytic anemia   Endometrial cancer    OSA (obstructive sleep apnea)   DVT of upper extremity (deep vein thrombosis)   Compression fracture of L1 lumbar vertebra   Compression fracture of L2   Discharge Condition: Improved, being discharged home  Diet recommendation: Low-sodium diet  Filed Weights   04/13/13 1300  Weight: 192.6 kg (424 lb 9.7 oz)    History of present illness:  On 12/22:47 y.o. year-old female with metastatic endometrial cancer and morbid obesity who presents with right upper extremity DVT. Briefly, she was diagnosed with grade 1 endometrial adenoCA in 2010 and  In 09/2012, she was found to have metastatic endometrial cancer during routine laparoscopy cholecystectomy. Her gallbladder was not removed. She has developed malignant ascites requiring intermittent paracentesis. She has been undergoing chemotherapy with taxol and carboplatinum by Dr. Laurette Marquez and has a right IJ port which has been having problems with drawing blood for the last month. Over the last week, [name] has developed progressive RUE pain, redness, and swelling that extends to the right lateral breast. She was started on antibiotics which did not help.   Her family convinced her to come to the ER where US demonstrated DVT in the right subclavian, axillary, and brachial veins along with superficial  thrombus in teh right cephalic and basilic veins. She was started on heparin gtt in the ER and is being admitted for port removal and transition to lovenox for indefinite therapy.    Hospital Course:  Active Problems:   Obesity - BMI 79   Bright red rectal bleeding   Endometrial cancer: Patient is being continued on chemotherapy. Her next appointment is scheduled for Wednesday 04/22/13. She has a Neulasta shot which was originally scheduled for 1/2 in the afternoon. With her kyphoplasty being done at this time, her appointment is being moved to earlier in the morning at 7:30 AM. Immediately after the shot, the patient will be transported over to interventional radiology     OSA (obstructive sleep apnea): Chronic. Stable.    DVT of upper extremity (deep vein thrombosis): Patient initially started on heparin drip. She underwent port removal and PICC line placement on 12/23 by interventional radiology. Heparin was briefly put on hold when she had a significant hemoglobin drop. This was not felt to be from acute bleeding, heparin restarted and then transitioned over to Lovenox once kyphoplasty was going to be delayed. Plan will be for patient to continue on Lovenox 190 mg subcutaneous every 12 hours. Patient will have an anti-xa Marquez drawn by home health on 12/27.    Compression fracture of L1 lumbar vertebra   Compression fracture of L2: Patient had kyphoplasty originally planned prior to admission. On admission, plan is for kyphoplasty to be done on 12/24 while she was hospitalized. However that early morning, patient had significant hemoglobin drop. Kyphoplasty was put on hold. Unfortunately, [name], interventional radiologist to his head  previous kyphoplasty experience with this patient is out of town and will not be available until next week. Kyphoplasty has been rescheduled for 1/2.  Lower extremity swelling: Chronic. Venous stasis. Continue Lasix. Patient already on anticoagulation so  further duplex not needed.  Macrocytic anemia: Likely due to chemotherapy plus iron deficiency. On 12/24, the patient's hemoglobin dropped from 8.0 down to 5.3. There is no corresponding drop in blood pressure, so this was not felt to be GI bleed however with a profound drop, bleeding need to be ruled out so kyphoplasty was put on hold and heparin was held. Following blood unit transfusion, the patient's hemoglobin improved up to 9. This is felt to be consistent with the patient's chemotherapy. Given the kyphoplasty is delayed, patient's heparin is being changed to Lovenox. Continue with iron supplementation. B12 and folate levels checked last month her normal  Procedures:  Removal of port and placement of PICC line on 12/23  Vascular ultrasound done 12/23:Findings consistent with acute deep vein thrombosis involving the right subclavian, axillary, and brachial veins. Superficial thrombus noted in the right cephalic and basilic veins.   Consultations:  Oncology  Interventional radiology  Discharge Exam: Filed Vitals:   04/17/13 0534  BP: 110/61  Pulse: 92  Temp: 98.1 F (36.7 C)  Resp: 16    General: Alert and oriented x3, no acute distress Cardiovascular: Regular rate and rhythm, S1-S2 Respiratory: Clear to auscultation bilaterally, decreased secondary to body habitus Extremities: Chronic venous stasis  Discharge Instructions  Discharge Orders   Future Appointments Provider Department Dept Phone   04/22/2013 12:15 PM [name] Lab 4 [facility] [phone]   04/22/2013 12:45 PM [name] Procedure 2 [facility] [phone]   04/22/2013 2:15 PM Catherine Level, NP [facility] [phone]   04/24/2013 11:15 AM [name] Inj Nurse [facility] [phone]   04/29/2013 11:15 AM [name] Lab 2 [facility] [phone]    04/29/2013 11:45 AM Catherine Level, NP [facility] MED[facility] ONCOL[facility] [phone]   05/04/2013 11:30 AM [name] Lab 5 [facility] [phone]   05/04/2013 12:30 PM [facility]-Ct 1 [facility] [phone]   Please pick up your oral contrast prep at your scheduled location at least 1 day prior to your appointment. Liquids only 4 hours prior to your exam. Any medications can be taken as usual. Please arrive 15 min prior to your scheduled exam time.   05/14/2013 9:15 AM Victorino December, MD [facility] [phone]   Future Orders Complete By Expires   Diet - low sodium heart healthy  As directed    Increase activity slowly  As directed        Medication List    STOP taking these medications       cephALEXin 500 MG capsule  Commonly known as:  KEFLEX     fluconazole 200 MG tablet  Commonly known as:  DIFLUCAN      TAKE these medications       b complex vitamins capsule  Take 1 capsule by mouth daily.     cyclobenzaprine 5 MG tablet  Commonly known as:  FLEXERIL  Take 5 mg by mouth 3 (three) times daily as needed for muscle spasms.     dextromethorphan-guaiFENesin 30-600 MG per 12 hr tablet  Commonly known as:  MUCINEX DM  Take 1 tablet by  mouth 2 (two) times daily.     enoxaparin 150 MG/ML injection  Commonly known as:  LOVENOX  Inject 1.27 mLs (190 mg total) into the skin every 12 (twelve) hours.     ferrous sulfate 325 (65 FE) MG tablet  Take 1 tablet (325 mg total) by mouth 3 (three) times daily with meals.     folic acid 400 MCG tablet  Commonly known as:  FOLVITE  Take 400 mcg by mouth daily.     furosemide 20 MG tablet  Commonly known as:  LASIX  Take 1 tablet (20 mg total) by mouth every other day.     lidocaine-prilocaine cream  Commonly known as:  EMLA  Apply 1 application topically as needed. Apply to port-a-cath site 1-2 hours prior to treatment.      loratadine 10 MG tablet  Commonly known as:  CLARITIN  Take 10 mg by mouth daily.     magnesium gluconate 500 MG tablet  Commonly known as:  MAGONATE  Take 500 mg by mouth daily.     miconazole nitrate Powd  Commonly known as:  MICATIN  Apply 1 application topically 4 (four) times daily.     multivitamin with minerals tablet  Take 1 tablet by mouth daily.     NEULASTA 6 MG/0.6ML injection  Generic drug:  pegfilgrastim  Inject 6 mg into the skin every 21 ( twenty-one) days.     nystatin ointment  Commonly known as:  MYCOSTATIN  Apply 1 application topically 2 (two) times daily.     ondansetron 8 MG tablet  Commonly known as:  ZOFRAN  Take 8 mg by mouth 2 (two) times daily. Take two times a day starting the day after chemo for 3 days. Then take two times a day as needed for nausea or vomiting.     oxyCODONE-acetaminophen 10-325 MG per tablet  Commonly known as:  PERCOCET  Take 1 tablet by mouth every 4 (four) hours as needed for pain.     pantoprazole 40 MG tablet  Commonly known as:  PROTONIX  Take 40 mg by mouth daily.     potassium chloride 10 MEQ tablet  Commonly known as:  K-DUR  Take 2 tablets (20 mEq total) by mouth daily.     PRESCRIPTION MEDICATION  every 21 ( twenty-one) days. CHEMOTHERAPY REGIMEN     prochlorperazine 10 MG tablet  Commonly known as:  COMPAZINE  Take 10 mg by mouth every 6 (six) hours as needed (Nausea or vomiting).     promethazine 25 MG tablet  Commonly known as:  PHENERGAN  Take 12.5 mg by mouth every 6 (six) hours as needed for nausea or vomiting.     sucralfate 1 GM/10ML suspension  Commonly known as:  CARAFATE  Take 10 mLs (1 g total) by mouth 4 (four) times daily.     TAXOL IV  Inject 1 each into the vein every 21 ( twenty-one) days.     VITAMIN B-12 PO  Take 2,500 mg by mouth daily.     vitamin C 1000 MG tablet  Take 1,000 mg by mouth daily.       No Known Allergies     Follow-up Information   Follow up with Catherine Rua, MD.   Specialty:  Family Medicine   Contact information:   [address] [place] [address] [phone]       Follow up with Oneal Grout, MD. (Interventional radiology appointment for kyphoplasty on Friday 04/24/13  at 8 AM)    Specialty:  Interventional Radiology   Contact information:   [address] [place] [address] [phone]        The results of significant diagnostics from this hospitalization (including imaging, microbiology, ancillary and laboratory) are listed below for reference.    Significant Diagnostic Studies: Ir Fluoro Guide Cv Line Left  04/14/2013   EXAM: 1.  ULTRASOUND AND FLUOROSCOPIC GUIDED PICC LINE INSERTION  2.  PORT A CATHETER REMOVAL  MEDICATIONS: Fentanyl 50 mcg IV; Ancef 3 g IV, the antibiotics were administered within an appropriate time frame prior to the initiation of the procedure.  Sedation time: 45 min  TECHNIQUE: The procedure, risks, benefits, and alternatives were explained to the patient and informed written consent was obtained. A timeout was performed prior to the initiation of the procedure.  The left upper extremity was prepped with chlorhexidine in a sterile fashion, and a sterile drape was applied covering the operative field. Maximum barrier sterile technique with sterile gowns and gloves were used for the procedure. A timeout was performed prior to the initiation of the procedure. Local anesthesia was provided with 1% lidocaine.  Under direct ultrasound guidance, the left cephalic vein was accessed with a micropuncture kit after the overlying soft tissues were anesthetized with 1% lidocaine. An ultrasound image was saved for documentation purposes. A guidewire was advanced to the Marquez of the superior caval-atrial junction for measurement purposes and the PICC line was cut to length. A peel-away sheath was placed and a 55 cm, 5 [place], single lumen was inserted to Marquez of the superior caval-atrial  junction. A post procedure spot fluoroscopic was obtained. The catheter easily aspirated and flushed and was sutured in place. A dressing was placed.  Attention was now paid to towards removal of the right anterior chest wall Port a Catheter. The right chest Port-A-Cath site was prepped with chlorhexidine. A sterile gown and gloves were worn during the procedure. Local anesthesia was provided with 1% lidocaine with epinephrine.  An incision was made overlying the Port-A-Cath with a #15 scalpel. Utilizing sharp and blunt dissection, the Port-A-Cath was removed completely. The pocked was irrigated with sterile saline. Wound closure was performed with subcutaneous 3-0 Monocryl, subcuticular 4-0 Vicryl and Dermabond. A dressing was placed. The patient tolerated the procedure well without immediate post procedural complication.  CONTRAST:  None  FLUOROSCOPY TIME:  2 minutes, 36 seconds.  COMPLICATIONS: None immediate  FINDINGS: After PICC placement, the tip lies within the superior cavoatrial junction. The catheter aspirates and flushes normally and is ready for immediate use.  IMPRESSION: 1. Successful ultrasound and fluoroscopic guided placement of a left cephalic vein approach, 55 cm, 5 [place], single lumen PICC with tip at the superior caval-atrial junction. The PICC line is ready for immediate use. 2. Successful removal of right anterior chest wall Port a Catheter.  INDICATION: History of metastatic endometrial cancer, post ultrasound and fluoroscopic guided Port a Catheter placement -10/31/2012, now with right upper extremity DVT. Please removed existing port-a-catheter and place left upper extremity approach PICC line for continued venous access.   Electronically Signed   By: Simonne Come M.D.   On: 04/14/2013 16:29   Ir Removal [employer] Access W/ [employer] W/o Fl Mod Sed  04/14/2013   EXAM: 1.  ULTRASOUND AND FLUOROSCOPIC GUIDED PICC LINE INSERTION  2.  PORT A CATHETER REMOVAL  MEDICATIONS: Fentanyl 50 mcg IV; Ancef 3 g  IV, the antibiotics were administered within an appropriate time  frame prior to the initiation of the procedure.  Sedation time: 45 min  TECHNIQUE: The procedure, risks, benefits, and alternatives were explained to the patient and informed written consent was obtained. A timeout was performed prior to the initiation of the procedure.  The left upper extremity was prepped with chlorhexidine in a sterile fashion, and a sterile drape was applied covering the operative field. Maximum barrier sterile technique with sterile gowns and gloves were used for the procedure. A timeout was performed prior to the initiation of the procedure. Local anesthesia was provided with 1% lidocaine.  Under direct ultrasound guidance, the left cephalic vein was accessed with a micropuncture kit after the overlying soft tissues were anesthetized with 1% lidocaine. An ultrasound image was saved for documentation purposes. A guidewire was advanced to the Marquez of the superior caval-atrial junction for measurement purposes and the PICC line was cut to length. A peel-away sheath was placed and a 55 cm, 5 [place], single lumen was inserted to Marquez of the superior caval-atrial junction. A post procedure spot fluoroscopic was obtained. The catheter easily aspirated and flushed and was sutured in place. A dressing was placed.  Attention was now paid to towards removal of the right anterior chest wall Port a Catheter. The right chest Port-A-Cath site was prepped with chlorhexidine. A sterile gown and gloves were worn during the procedure. Local anesthesia was provided with 1% lidocaine with epinephrine.  An incision was made overlying the Port-A-Cath with a #15 scalpel. Utilizing sharp and blunt dissection, the Port-A-Cath was removed completely. The pocked was irrigated with sterile saline. Wound closure was performed with subcutaneous 3-0 Monocryl, subcuticular 4-0 Vicryl and Dermabond. A dressing was placed. The patient tolerated the procedure  well without immediate post procedural complication.  CONTRAST:  None  FLUOROSCOPY TIME:  2 minutes, 36 seconds.  COMPLICATIONS: None immediate  FINDINGS: After PICC placement, the tip lies within the superior cavoatrial junction. The catheter aspirates and flushes normally and is ready for immediate use.  IMPRESSION: 1. Successful ultrasound and fluoroscopic guided placement of a left cephalic vein approach, 55 cm, 5 [place], single lumen PICC with tip at the superior caval-atrial junction. The PICC line is ready for immediate use. 2. Successful removal of right anterior chest wall Port a Catheter.  INDICATION: History of metastatic endometrial cancer, post ultrasound and fluoroscopic guided Port a Catheter placement -10/31/2012, now with right upper extremity DVT. Please removed existing port-a-catheter and place left upper extremity approach PICC line for continued venous access.   Electronically Signed   By: Simonne Come M.D.   On: 04/14/2013 16:29   Ir US Guide Vasc Access Left  04/14/2013   EXAM: 1.  ULTRASOUND AND FLUOROSCOPIC GUIDED PICC LINE INSERTION  2.  PORT A CATHETER REMOVAL  MEDICATIONS: Fentanyl 50 mcg IV; Ancef 3 g IV, the antibiotics were administered within an appropriate time frame prior to the initiation of the procedure.  Sedation time: 45 min  TECHNIQUE: The procedure, risks, benefits, and alternatives were explained to the patient and informed written consent was obtained. A timeout was performed prior to the initiation of the procedure.  The left upper extremity was prepped with chlorhexidine in a sterile fashion, and a sterile drape was applied covering the operative field. Maximum barrier sterile technique with sterile gowns and gloves were used for the procedure. A timeout was performed prior to the initiation of the procedure. Local anesthesia was provided with 1% lidocaine.  Under direct ultrasound guidance, the left cephalic vein was  accessed with a micropuncture kit after the  overlying soft tissues were anesthetized with 1% lidocaine. An ultrasound image was saved for documentation purposes. A guidewire was advanced to the Marquez of the superior caval-atrial junction for measurement purposes and the PICC line was cut to length. A peel-away sheath was placed and a 55 cm, 5 [place], single lumen was inserted to Marquez of the superior caval-atrial junction. A post procedure spot fluoroscopic was obtained. The catheter easily aspirated and flushed and was sutured in place. A dressing was placed.  Attention was now paid to towards removal of the right anterior chest wall Port a Catheter. The right chest Port-A-Cath site was prepped with chlorhexidine. A sterile gown and gloves were worn during the procedure. Local anesthesia was provided with 1% lidocaine with epinephrine.  An incision was made overlying the Port-A-Cath with a #15 scalpel. Utilizing sharp and blunt dissection, the Port-A-Cath was removed completely. The pocked was irrigated with sterile saline. Wound closure was performed with subcutaneous 3-0 Monocryl, subcuticular 4-0 Vicryl and Dermabond. A dressing was placed. The patient tolerated the procedure well without immediate post procedural complication.  CONTRAST:  None  FLUOROSCOPY TIME:  2 minutes, 36 seconds.  COMPLICATIONS: None immediate  FINDINGS: After PICC placement, the tip lies within the superior cavoatrial junction. The catheter aspirates and flushes normally and is ready for immediate use.  IMPRESSION: 1. Successful ultrasound and fluoroscopic guided placement of a left cephalic vein approach, 55 cm, 5 [place], single lumen PICC with tip at the superior caval-atrial junction. The PICC line is ready for immediate use. 2. Successful removal of right anterior chest wall Port a Catheter.  INDICATION: History of metastatic endometrial cancer, post ultrasound and fluoroscopic guided Port a Catheter placement -10/31/2012, now with right upper extremity DVT. Please removed  existing port-a-catheter and place left upper extremity approach PICC line for continued venous access.   Electronically Signed   By: Simonne Come M.D.   On: 04/14/2013 16:29    Microbiology: No results found for this or any previous visit (from the past 240 hour(s)).   Labs: Basic Metabolic Panel:  Recent Labs Lab 04/13/13 1225 04/14/13 0545 04/15/13 0425 04/16/13 0120 04/17/13 0508  NA 138 136 127* 139 138  K 3.8 3.4* 2.7* 4.0 3.6  CL 99 98 95* 103 102  CO2 29 29 23 27 28   GLUCOSE 103* 89 96 115* 93  BUN 7 8 6 6 6   CREATININE 0.61 0.61 0.49* 0.62 0.54  CALCIUM 8.9 8.4 6.6* 8.4 8.5   Liver Function Tests: No results found for this basename: AST, ALT, ALKPHOS, BILITOT, PROT, ALBUMIN,  in the last 168 hours No results found for this basename: LIPASE, AMYLASE,  in the last 168 hours No results found for this basename: AMMONIA,  in the last 168 hours CBC:  Recent Labs Lab 04/13/13 1225 04/14/13 0545 04/15/13 0425 04/16/13 0120 04/17/13 0508  WBC 4.6 4.6 6.0 7.2 7.0  NEUTROABS 2.9  --   --   --   --   HGB 9.5* 8.4* 5.0* 9.1* 9.0*  HCT 31.3* 28.0* 16.4* 29.9* 29.7*  MCV 108.7* 106.9* 108.6* 104.5* 104.9*  PLT 166 146* 154 147* 148*   Cardiac Enzymes: No results found for this basename: CKTOTAL, CKMB, CKMBINDEX, TROPONINI,  in the last 168 hours BNP: BNP (last 3 results)  Recent Labs  03/12/13 0845  PROBNP 2322.0*   CBG: No results found for this basename: GLUCAP,  in the last 168 hours  Signed:  Hollice Espy  [facility] 04/17/2013, 12:48 PM

## 2013-04-17 NOTE — Care Management Note (Addendum)
Pt currently active with [facility] for RN and PT. CM consulted to contact agency to verify anti-Xa level draws cans be provided by [facility]. Per agency rep Lanae Crumbly [facility] able to draw labs per as ordered. Cm informed bedside RN. Awaiting MD orders to resume Home Health with specific instructions for [facility] to draw anti-Xa as appropriate.    [name] [phone]

## 2013-04-17 NOTE — Progress Notes (Signed)
Pt refused to be set up on [email or web address] 

## 2013-04-17 NOTE — Progress Notes (Signed)
ANTICOAGULATION CONSULT NOTE - Follow Up Consult  Pharmacy Consult for Lovenox Indication: RUE DVT  No Known Allergies  Patient Measurements: Height: 5' 10.87" (180 cm) Weight: 424 lb 9.7 oz (192.6 kg) IBW/kg (Calculated) : 70.49 Heparin dosing wt: 120kg  Vital Signs: Temp: 98.1 F (36.7 C) (12/26 0534) Temp src: Oral (12/26 0534) BP: 110/61 mmHg (12/26 0534) Pulse Rate: 92 (12/26 0534)  Labs:  Recent Labs  04/15/13 0040  04/15/13 0425 04/16/13 0120 04/17/13 0508  HGB  --   < > 5.0* 9.1* 9.0*  HCT  --   --  16.4* 29.9* 29.7*  PLT  --   --  154 147* 148*  HEPARINUNFRC 0.36  --   --   --   --   CREATININE  --   --  0.49* 0.62 0.54  < > = values in this interval not displayed.  Estimated Creatinine Clearance: 167.2 ml/min (by C-G formula based on Cr of 0.54).   Medications:  Scheduled:  . dextromethorphan-guaiFENesin  1 tablet Oral BID  . enoxaparin (LOVENOX) injection  190 mg Subcutaneous Q12H  . enoxaparin   Does not apply Once  . ferrous sulfate  325 mg Oral TID WC  . furosemide  20 mg Oral QODAY  . miconazole nitrate  1 application Topical QID  . multivitamin with minerals  1 tablet Oral Daily  . nystatin ointment  1 application Topical BID  . pantoprazole  40 mg Oral Daily  . sodium chloride  3 mL Intravenous Q12H  . sucralfate  1 g Oral QID   Infusions:    PRN: acetaminophen, cyclobenzaprine, fentaNYL, oxyCODONE, oxyCODONE-acetaminophen, promethazine, sodium chloride  Assessment: 47 y/o F admitted on 12/22 with catheter-associated RUE DVT.  Pharmacy was consulted to dose IV heparin.  Her Port-A-Cath was removed on 04/14/13 and she was scheduled to have kyphoplasty by IR in 12/24 (interrupt Heparin), then convert to Lovenox.    12/24 Hgb decreased to 5.0.   No overt bleeding noted, no source of blood loss is found.  MD discontinued heparin and ordered 2 units PRBC.  12/25, Hgb improved to 9.1 s/p transfusion.  Plt 147 (near baseline).  No source of any  blood loss identified.  Heparin was resumed, but because plans for kyphoplasty are delayed, transition was made to Lovenox 1mg /kg SQ q12h (190 mg SQ q12h), first dose at 6pm.  Patient has now received 2 doses of Lovenox on a 6am - 6pm schedule.  Hgb remains stable (9.0).  SCr stable. No bleeding reported.   Goal of Therapy:  Anti-Xa level 0.6-1 units/ml 4hrs after LMWH dose given Monitor platelets by anticoagulation protocol: Yes   Recommend: 1. Continue Lovenox 190 mg SQ q12h at 6am and 6pm. 2. Check anti-Xa level 4 hours after next dose (or after tomorrow morning's dose if patient is discharged today). 3. Adjust Lovenox dosage to maintain anti-Xa level 0.6 - 1 units/mL 4 hrs post-dose.  Elie Goody, PharmD, BCPS Pager: [phone] 04/17/2013  12:04 PM

## 2013-04-17 NOTE — Progress Notes (Signed)
Patient ID: [name], female   DOB: 08-Dec-1965, 47 y.o.   MRN: [id number] Pt has been tent scheduled for VP L1/L2 on 04/24/13 at 0930 at [facility]. If OP at that time she should report to [facility] at [facility] 04/24/13. Lovenox should be held day of procedure. Pt aware of above plans. Call [phone] or [phone] with any questions.

## 2013-04-20 ENCOUNTER — Other Ambulatory Visit (HOSPITAL_COMMUNITY): Payer: Self-pay | Admitting: Radiology

## 2013-04-21 ENCOUNTER — Other Ambulatory Visit: Payer: Self-pay | Admitting: Emergency Medicine

## 2013-04-21 DIAGNOSIS — C541 Malignant neoplasm of endometrium: Secondary | ICD-10-CM

## 2013-04-22 ENCOUNTER — Other Ambulatory Visit: Payer: BC Managed Care – PPO

## 2013-04-22 ENCOUNTER — Ambulatory Visit (HOSPITAL_BASED_OUTPATIENT_CLINIC_OR_DEPARTMENT_OTHER): Payer: BC Managed Care – PPO

## 2013-04-22 ENCOUNTER — Encounter: Payer: Self-pay | Admitting: Adult Health

## 2013-04-22 ENCOUNTER — Other Ambulatory Visit: Payer: Self-pay | Admitting: Radiology

## 2013-04-22 ENCOUNTER — Other Ambulatory Visit (HOSPITAL_BASED_OUTPATIENT_CLINIC_OR_DEPARTMENT_OTHER): Payer: BC Managed Care – PPO

## 2013-04-22 ENCOUNTER — Ambulatory Visit (HOSPITAL_BASED_OUTPATIENT_CLINIC_OR_DEPARTMENT_OTHER): Payer: BC Managed Care – PPO | Admitting: Adult Health

## 2013-04-22 DIAGNOSIS — R188 Other ascites: Secondary | ICD-10-CM

## 2013-04-22 DIAGNOSIS — C541 Malignant neoplasm of endometrium: Secondary | ICD-10-CM

## 2013-04-22 DIAGNOSIS — C549 Malignant neoplasm of corpus uteri, unspecified: Secondary | ICD-10-CM

## 2013-04-22 DIAGNOSIS — M549 Dorsalgia, unspecified: Secondary | ICD-10-CM

## 2013-04-22 DIAGNOSIS — Z5111 Encounter for antineoplastic chemotherapy: Secondary | ICD-10-CM

## 2013-04-22 DIAGNOSIS — I82409 Acute embolism and thrombosis of unspecified deep veins of unspecified lower extremity: Secondary | ICD-10-CM

## 2013-04-22 LAB — COMPREHENSIVE METABOLIC PANEL (CC13)
Albumin: 2.5 g/dL — ABNORMAL LOW (ref 3.5–5.0)
Alkaline Phosphatase: 86 U/L (ref 40–150)
BUN: 5.4 mg/dL — ABNORMAL LOW (ref 7.0–26.0)
CO2: 24 mEq/L (ref 22–29)
Calcium: 8.9 mg/dL (ref 8.4–10.4)
Chloride: 103 mEq/L (ref 98–109)
Glucose: 113 mg/dl (ref 70–140)
Potassium: 3.5 mEq/L (ref 3.5–5.1)
Sodium: 142 mEq/L (ref 136–145)
Total Bilirubin: 0.48 mg/dL (ref 0.20–1.20)
Total Protein: 6.1 g/dL — ABNORMAL LOW (ref 6.4–8.3)

## 2013-04-22 LAB — CBC WITH DIFFERENTIAL/PLATELET
Basophils Absolute: 0 10*3/uL (ref 0.0–0.1)
EOS%: 0.5 % (ref 0.0–7.0)
Eosinophils Absolute: 0 10*3/uL (ref 0.0–0.5)
HCT: 34.3 % — ABNORMAL LOW (ref 34.8–46.6)
HGB: 10.3 g/dL — ABNORMAL LOW (ref 11.6–15.9)
MCH: 31.7 pg (ref 25.1–34.0)
MONO#: 0.8 10*3/uL (ref 0.1–0.9)
NEUT#: 5.2 10*3/uL (ref 1.5–6.5)
NEUT%: 70.5 % (ref 38.4–76.8)
RDW: 19.8 % — ABNORMAL HIGH (ref 11.2–14.5)
WBC: 7.4 10*3/uL (ref 3.9–10.3)
lymph#: 1.3 10*3/uL (ref 0.9–3.3)

## 2013-04-22 MED ORDER — DIPHENHYDRAMINE HCL 50 MG/ML IJ SOLN
50.0000 mg | Freq: Once | INTRAMUSCULAR | Status: AC
  Administered 2013-04-22: 50 mg via INTRAVENOUS

## 2013-04-22 MED ORDER — CARBOPLATIN CHEMO INTRADERMAL TEST DOSE 100MCG/0.02ML
100.0000 ug | Freq: Once | INTRADERMAL | Status: AC
  Administered 2013-04-22: 100 ug via INTRADERMAL
  Filled 2013-04-22: qty 0.01

## 2013-04-22 MED ORDER — HEPARIN SOD (PORK) LOCK FLUSH 100 UNIT/ML IV SOLN
250.0000 [IU] | Freq: Once | INTRAVENOUS | Status: AC | PRN
  Administered 2013-04-22: 250 [IU]
  Filled 2013-04-22: qty 5

## 2013-04-22 MED ORDER — PALONOSETRON HCL INJECTION 0.25 MG/5ML
0.2500 mg | Freq: Once | INTRAVENOUS | Status: AC
  Administered 2013-04-22: 0.25 mg via INTRAVENOUS

## 2013-04-22 MED ORDER — SODIUM CHLORIDE 0.9 % IJ SOLN
10.0000 mL | INTRAMUSCULAR | Status: DC | PRN
  Administered 2013-04-22: 10 mL
  Filled 2013-04-22: qty 10

## 2013-04-22 MED ORDER — FAMOTIDINE IN NACL 20-0.9 MG/50ML-% IV SOLN
20.0000 mg | Freq: Once | INTRAVENOUS | Status: AC
  Administered 2013-04-22: 20 mg via INTRAVENOUS

## 2013-04-22 MED ORDER — DEXAMETHASONE SODIUM PHOSPHATE 20 MG/5ML IJ SOLN
INTRAMUSCULAR | Status: AC
  Filled 2013-04-22: qty 5

## 2013-04-22 MED ORDER — DEXAMETHASONE SODIUM PHOSPHATE 20 MG/5ML IJ SOLN
12.0000 mg | Freq: Once | INTRAMUSCULAR | Status: AC
  Administered 2013-04-22: 12 mg via INTRAVENOUS

## 2013-04-22 MED ORDER — OXYCODONE-ACETAMINOPHEN 10-325 MG PO TABS: 1.0000 | ORAL_TABLET | ORAL | Status: DC | PRN

## 2013-04-22 MED ORDER — DIPHENHYDRAMINE HCL 50 MG/ML IJ SOLN
INTRAMUSCULAR | Status: AC
  Filled 2013-04-22: qty 1

## 2013-04-22 MED ORDER — SODIUM CHLORIDE 0.9 % IV SOLN
900.0000 mg | Freq: Once | INTRAVENOUS | Status: AC
  Administered 2013-04-22: 900 mg via INTRAVENOUS
  Filled 2013-04-22: qty 90

## 2013-04-22 MED ORDER — FAMOTIDINE IN NACL 20-0.9 MG/50ML-% IV SOLN
INTRAVENOUS | Status: AC
  Filled 2013-04-22: qty 50

## 2013-04-22 MED ORDER — PALONOSETRON HCL INJECTION 0.25 MG/5ML
INTRAVENOUS | Status: AC
  Filled 2013-04-22: qty 5

## 2013-04-22 MED ORDER — PACLITAXEL CHEMO INJECTION 300 MG/50ML
175.0000 mg/m2 | Freq: Once | INTRAVENOUS | Status: AC
  Administered 2013-04-22: 618 mg via INTRAVENOUS
  Filled 2013-04-22: qty 103

## 2013-04-22 MED ORDER — SODIUM CHLORIDE 0.9 % IV SOLN
150.0000 mg | Freq: Once | INTRAVENOUS | Status: AC
  Administered 2013-04-22: 150 mg via INTRAVENOUS
  Filled 2013-04-22: qty 5

## 2013-04-22 NOTE — Progress Notes (Signed)
[facility]  [phone] [phone]  OFFICE PROGRESS NOTE    PATIENT: [name]   DOB: 1965/04/28  MR#: [id number]  JYN#:[id number]   QM:VHQION, Jeannett Senior, MD GYN ONC: Laurette Schimke, M.D. SU:  Manus Rudd, M.D.    DIAGNOSIS: [name] is a 47 y.o. female with a prior history of stage IA uterine cancer diagnosed in 2010.  Now with recurrent disease diagnosed in 08/2012.  PRIOR THERAPY: #1  Patient developed excessive uterine bleeding.  She underwent dilatation and curettage with hysteroscopy. 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 Due to morbid obesity Mirena IUD was placed along with Aygestin. Patient had a dramatic directed weight loss with nutritional support she went from 523 pounds to 359 pounds in 10/2009.  On 12/27/2009 she underwent a robotic-assisted laparoscopic hysterectomy, bilateral salpingo-oophorectomy, and mini laparotomy through the umbilical port with morcellation of uterus within about the delivery of the uterus. The final pathology revealed an endometrial adenocarcinoma grade 2 with invasion limited to 1 mm of the myometrium.  #2  Patient continued to do well until 09/2012 when she developed right upper quadrant pain. It was presumed to be cholelithiasis. 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 Omental biopsies were obtained. The pathology was consistent with metastatic adenocarcinoma with the presumption of recurrent endometrial carcinoma.  She was seen by Dr. Laurette Schimke on 10/07/2012.  Dr. Nelly Rout recommended that [name] undergo chemotherapy consisting of Taxol and Carboplatinum.  Chemotherapy started on 11/04/2012.   #3 Abdominal ascites with multiple paracenteses.  #4 Back pain and subsequent MRI on 02/05/2013 revealed compression fractures of T10 and T11.    #5 Status post kyphoplasty with Dr. Julieanne Cotton on 02/26/2013.  Patient states her back pain continues.  #6 Patient hospitalized and underwent more MRI's on 12/4 and L1 and L2 compression fractures were noted, after consultation with Dr. Newell Coral, the patient was referred back to interventional radiology for evaluation for kyphoplasty.     CURRENT THERAPY:  Taxol/Carboplatin given every 21 days with 8 cycles planned.  Cycle 8 day 1.   INTERVAL HISTORY: [name] returns today for evaluation prior to receiving her eighth cycle of Taxol/Carbo.  She was recently hospitalized due to an extensive right upper extremity DVT and was placed on a Heparin gtt and underwent port removal.  A PICC line was placed.  She was subsequently discharged, and a kyphoplasty for her L1 and L2 compression fractures was scheduled for 04/24/13.  She is doing well today.  She continues to have back pain.  She is looking forward to her kyphoplasty.  She is requesting a refill on her Percocet.  She denies fevers, chills, nausea, vomiting, constipation, diarrhea, numbness, or any further concerns.    PAST MEDICAL HISTORY: Past Medical History  Diagnosis Date  . Obesity   . DVT (deep venous thrombosis) 2009    RLE, tx for ~6 months  . PONV (postoperative nausea and vomiting)   . Swelling     BOTH LEGS  . Rash     LOWER ABD  . Gallstones   . Ascites   . Uterine cancer     Dr. Welton Flakes, chemotherapy  . Family history of anesthesia complication     MOTHER IS DIFFICULT TO WAKE  . Gallstones   . Cellulitis of leg, right     PAST SURGICAL HISTORY: Past Surgical History  Procedure  Laterality Date  . Ankle surgery  1990  . Wisdom tooth extraction    . Tonsillectomy and adenoidectomy    . Abdominal hysterectomy  12/2009    [facility], BSO  . Laparoscopy N/A 10/02/2012    Procedure: LAPAROSCOPY DIAGNOSTIC, perocentisis, omental biopsy;  Surgeon: Wilmon Arms. Tsuei, MD;  Location: [facility];  Service: General;  Laterality:  N/A;  removed a total of 15,000 of acities  . Paracentesis    . Kyphoplasty  02/26/2013    FAMILY HISTORY: Family History  Problem Relation Age of Onset  . Heart disease Father   . Diabetes Brother   . Heart disease Mother   . Cirrhosis Father   . Diabetes Father   . Thyroid disease Mother   . Diabetes Mother   . Kidney disease Brother     from diabetes  . Breast cancer Maternal Aunt   . Breast cancer Maternal Aunt   . Melanoma Maternal Aunt   . Melanoma Maternal Aunt   . Other Maternal Aunt     lupus anticardiolipin ab syndrome    SOCIAL HISTORY: History  Substance Use Topics  . Smoking status: Never Smoker   . Smokeless tobacco: Never Used  . Alcohol Use: Yes     Comment: social, Occassionally    ALLERGIES: No Known Allergies   MEDICATIONS:  Current Outpatient Prescriptions  Medication Sig Dispense Refill  . Ascorbic Acid (VITAMIN C) 1000 MG tablet Take 1,000 mg by mouth daily.      Marland Kitchen b complex vitamins capsule Take 1 capsule by mouth daily.      . Cyanocobalamin (VITAMIN B-12 PO) Take 2,500 mg by mouth daily.       . cyclobenzaprine (FLEXERIL) 5 MG tablet Take 5 mg by mouth 3 (three) times daily as needed for muscle spasms.      Marland Kitchen dextromethorphan-guaiFENesin (MUCINEX DM) 30-600 MG per 12 hr tablet Take 1 tablet by mouth 2 (two) times daily.      Marland Kitchen enoxaparin (LOVENOX) 150 MG/ML injection Inject 1.27 mLs (190 mg total) into the skin every 12 (twelve) hours.  28 Syringe  0  . ferrous sulfate 325 (65 FE) MG tablet Take 1 tablet (325 mg total) by mouth 3 (three) times daily with meals.  90 tablet  2  . folic acid (FOLVITE) 400 MCG tablet Take 400 mcg by mouth daily.        . furosemide (LASIX) 20 MG tablet Take 1 tablet (20 mg total) by mouth every other day.  30 tablet  0  . lidocaine-prilocaine (EMLA) cream Apply 1 application topically as needed. Apply to port-a-cath site 1-2 hours prior to treatment.      Marland Kitchen loratadine (CLARITIN) 10 MG tablet Take 10 mg by mouth  daily.      . magnesium gluconate (MAGONATE) 500 MG tablet Take 500 mg by mouth daily.      . miconazole nitrate (MICATIN) POWD Apply 1 application topically 4 (four) times daily.  10 g  2  . Multiple Vitamins-Minerals (MULTIVITAMIN WITH MINERALS) tablet Take 1 tablet by mouth daily.       Marland Kitchen nystatin ointment (MYCOSTATIN) Apply 1 application topically 2 (two) times daily.      . ondansetron (ZOFRAN) 8 MG tablet Take 8 mg by mouth 2 (two) times daily. Take two times a day starting the day after chemo for 3 days. Then take two times a day as needed for nausea or vomiting.      Marland Kitchen oxyCODONE-acetaminophen (PERCOCET) 10-325 MG  per tablet Take 1 tablet by mouth every 4 (four) hours as needed for pain.  30 tablet  0  . PACLitaxel (TAXOL IV) Inject 1 each into the vein every 21 ( twenty-one) days.      . pantoprazole (PROTONIX) 40 MG tablet Take 40 mg by mouth daily.      . pegfilgrastim (NEULASTA) 6 MG/0.6ML injection Inject 6 mg into the skin every 21 ( twenty-one) days.      . potassium chloride (K-DUR) 10 MEQ tablet Take 2 tablets (20 mEq total) by mouth daily.  14 tablet  0  . PRESCRIPTION MEDICATION every 21 ( twenty-one) days. CHEMOTHERAPY REGIMEN      . prochlorperazine (COMPAZINE) 10 MG tablet Take 10 mg by mouth every 6 (six) hours as needed (Nausea or vomiting).      . promethazine (PHENERGAN) 25 MG tablet Take 12.5 mg by mouth every 6 (six) hours as needed for nausea or vomiting.      . sucralfate (CARAFATE) 1 GM/10ML suspension Take 10 mLs (1 g total) by mouth 4 (four) times daily.  420 mL  2   No current facility-administered medications for this visit.      REVIEW OF SYSTEMS: A 10 point review of systems was completed and is negative except as noted above.   PHYSICAL EXAMINATION: There were no vitals taken for this visit.  General: Patient is a well appearing female in no acute distress HEENT: PERRLA, sclerae anicteric no conjunctival pallor, MMM Neck: supple, no palpable  adenopathy Lungs: clear to auscultation bilaterally, no wheezes, rhonchi, or rales Cardiovascular: regular rate rhythm, S1, S2, no murmurs, rubs or gallops Abdomen: Soft, non-tender, non-distended, normoactive bowel sounds, no HSM, limited exam patient morbidly obese Extremities: warm and well perfused, no clubbing, cyanosis, or edema, strength 5/5 all extremities Skin: no rashes or lesions Neuro: Non-focal ECOG FS:  3 - Symptomatic, >50% confined to bed   LAB RESULTS: Lab Results  Component Value Date   WBC 7.4 04/22/2013   NEUTROABS 5.2 04/22/2013   HGB 10.3* 04/22/2013   HCT 34.3* 04/22/2013   MCV 105.5* 04/22/2013   PLT 368 04/22/2013      Chemistry      Component Value Date/Time   NA 142 04/22/2013 1227   NA 138 04/17/2013 0508   K 3.5 04/22/2013 1227   K 3.6 04/17/2013 0508   CL 102 04/17/2013 0508   CL 98 10/07/2012 0923   CO2 24 04/22/2013 1227   CO2 28 04/17/2013 0508   BUN 5.4* 04/22/2013 1227   BUN 6 04/17/2013 0508   CREATININE 0.6 04/22/2013 1227   CREATININE 0.54 04/17/2013 0508      Component Value Date/Time   CALCIUM 8.9 04/22/2013 1227   CALCIUM 8.5 04/17/2013 0508   ALKPHOS 86 04/22/2013 1227   ALKPHOS 179* 03/12/2013 0845   AST 38* 04/22/2013 1227   AST 42* 03/12/2013 0845   ALT 21 04/22/2013 1227   ALT 53* 03/12/2013 0845   BILITOT 0.48 04/22/2013 1227   BILITOT 0.5 03/12/2013 0845       No results found for this basename: LABCA2    No components found with this basename: [id number]     RADIOGRAPHIC STUDIES: Ir Vertebroplasty Or Sacroplasty 03/02/2013   CLINICAL DATA:  Patient with severely painful compression fracture at T10 and T11. History of endometrial carcinoma. On chemotherapy.  EXAM: VERTEBROPLASTY FL AT T10 and T11.  CORE BIOPSIES OBTAINED  MEDICATIONS: Versed 4 mg. mg IV, Fentanyl 100  mcg IV.  ANESTHESIA/SEDATION: Total Moderate Sedation Time:  35 min.  FLUOROSCOPY TIME:  8 min 48 seconds. Marland Kitchen  PROCEDURE: Following a full  explanation of the procedure along with the potentially associated complications, a witnessed informed consent was obtained.  The patient was placed prone on the fluoroscopic table. Nasal oxygen was administered. Physiologic monitoring was performed throughout the duration of the procedure. The skin overlying the thoracic region was prepped and draped in the usual sterile fashion. The T10 and T11 vertebral bodies were identified and the right pedicle at T10, and the left pedicle at T11 were infiltrated with 0.25% Bupivacaine. This was then followed by the advancement of a 13-gauge Cook needle through both the pedicles into the anterior one-third at both levels. Prior to this, 18 gauge core biopsy needles were used to obtain 4 samples of both the levels using a 20 mL syringe. These were sent for pathologic analysis. A gentle contrast injection demonstrated a trabecular pattern of contrast.  At this time, methylmethacrylate mixture was reconstituted. Under biplane intermittent fluoroscopy, the methylmethacrylate was then injected into the T10 and T11 vertebral body with filling of the vertebral body at T10 and T12.  Mild extravasation was noted into the disk spaces through the inferior endplate fracture clefts . No epidural venous contamination was seen.  The needles were then removed. Hemostasis was achieved at the skin entry site.  There were no acute complications. Patient tolerated the procedure well. The patient was observed for 3 hours and discharged in good condition.  IMPRESSION: Status post vertebral body augmentation for painful compression fracture at T10 and T11 using vertebroplasty technique.  Core biopsies obtained at T10 and T11. Sample sent for pathologic analysis.   Electronically Signed   By: Julieanne Cotton M.D.   On: 02/26/2013 13:10      ASSESSMENT: [name] is a 47 y.o. woman:  #1 Recurrent uterine cancer with peritoneal carcinomatosis and ascites.  Port a cath was placed in the  OR, and the patient has been treated in the palliative setting with Taxol and Carboplatinum that started on 11/04/2012.    #2 Ascites: History of having paracenteses 1-2 times per week, she hasn't received/required a paracentesis since 11/24/2012.  #3 Back pain - Compression fractures at T10 and T11 per MRI on 02/05/2013.  Status post kyphoplasty on 02/26/2013.  Patient with repeat MRI on 12/4 demonstrating compression fractures at L1 and L2.     PLAN: #1  [name] is doing relatively well today.  She has her PICC line and her port has been removed.  Her CBC is stable and she will proceed with chemotherapy today.    #2  She is tolerating the lovenox well.  She will hold it the day prior to her kyphoplasty.  Her right arm swelling is improving.   #3.  She will undergo kyphoplasty of L1 and L2 compression fractures on 04/24/13.    #4  She will return on 04/29/13 for labs, and evaluation.  She will undergo a CT chest/abdomen/pelvis on 05/04/13 and will discuss these results with Dr. Welton Flakes on 05/13/13.      All questions were answered. The patient and her family were encouraged to contact us in the interim with any problems, questions or concerns.   I spent 25 minutes counseling the patient face to face.  The total time spent in the appointment was 30 minutes.  Illa Level, NP Medical Oncology [facility] [phone]  04/23/2013  1:48 PM

## 2013-04-22 NOTE — Patient Instructions (Signed)
[facility] Discharge Instructions for Patients Receiving Chemotherapy  Today you received the following chemotherapy agents: Taxol and Carboplatin.  To help prevent nausea and vomiting after your treatment, we encourage you to take your nausea medication as prescribed.   If you develop nausea and vomiting that is not controlled by your nausea medication, call the clinic.   BELOW ARE SYMPTOMS THAT SHOULD BE REPORTED IMMEDIATELY:  *FEVER GREATER THAN 100.5 F  *CHILLS WITH OR WITHOUT FEVER  NAUSEA AND VOMITING THAT IS NOT CONTROLLED WITH YOUR NAUSEA MEDICATION  *UNUSUAL SHORTNESS OF BREATH  *UNUSUAL BRUISING OR BLEEDING  TENDERNESS IN MOUTH AND THROAT WITH OR WITHOUT PRESENCE OF ULCERS  *URINARY PROBLEMS  *BOWEL PROBLEMS  UNUSUAL RASH Items with * indicate a potential emergency and should be followed up as soon as possible.  Feel free to call the clinic you have any questions or concerns. The clinic phone number is [phone].    

## 2013-04-24 ENCOUNTER — Other Ambulatory Visit: Payer: Self-pay | Admitting: Adult Health

## 2013-04-24 ENCOUNTER — Encounter (HOSPITAL_COMMUNITY): Payer: Self-pay

## 2013-04-24 ENCOUNTER — Ambulatory Visit (HOSPITAL_BASED_OUTPATIENT_CLINIC_OR_DEPARTMENT_OTHER): Payer: BC Managed Care – PPO

## 2013-04-24 ENCOUNTER — Ambulatory Visit (HOSPITAL_COMMUNITY)
Admission: RE | Admit: 2013-04-24 | Discharge: 2013-04-24 | Disposition: A | Discharge status: Home / Self Care | Payer: BC Managed Care – PPO | Source: Ambulatory Visit | Attending: Adult Health | Admitting: Adult Health

## 2013-04-24 VITALS — BP 114/60 | HR 92 | Temp 98.3°F | Resp 18 | Ht 71.0 in | Wt >= 6400 oz

## 2013-04-24 VITALS — HR 88 | Resp 18

## 2013-04-24 DIAGNOSIS — IMO0002 Reserved for concepts with insufficient information to code with codable children: Secondary | ICD-10-CM

## 2013-04-24 DIAGNOSIS — K8020 Calculus of gallbladder without cholecystitis without obstruction: Secondary | ICD-10-CM | POA: Insufficient documentation

## 2013-04-24 DIAGNOSIS — Z8542 Personal history of malignant neoplasm of other parts of uterus: Secondary | ICD-10-CM | POA: Insufficient documentation

## 2013-04-24 DIAGNOSIS — E669 Obesity, unspecified: Secondary | ICD-10-CM | POA: Insufficient documentation

## 2013-04-24 DIAGNOSIS — C775 Secondary and unspecified malignant neoplasm of intrapelvic lymph nodes: Secondary | ICD-10-CM

## 2013-04-24 DIAGNOSIS — M8448XA Pathological fracture, other site, initial encounter for fracture: Secondary | ICD-10-CM | POA: Insufficient documentation

## 2013-04-24 DIAGNOSIS — C549 Malignant neoplasm of corpus uteri, unspecified: Secondary | ICD-10-CM

## 2013-04-24 DIAGNOSIS — Z5189 Encounter for other specified aftercare: Secondary | ICD-10-CM

## 2013-04-24 DIAGNOSIS — C541 Malignant neoplasm of endometrium: Secondary | ICD-10-CM

## 2013-04-24 DIAGNOSIS — Z86718 Personal history of other venous thrombosis and embolism: Secondary | ICD-10-CM | POA: Insufficient documentation

## 2013-04-24 LAB — BASIC METABOLIC PANEL
BUN: 6 mg/dL (ref 6–23)
CO2: 31 mEq/L (ref 19–32)
Calcium: 8.1 mg/dL — ABNORMAL LOW (ref 8.4–10.5)
Chloride: 104 mEq/L (ref 96–112)
Creatinine, Ser: 0.47 mg/dL — ABNORMAL LOW (ref 0.50–1.10)
GFR calc non Af Amer: >90 mL/min (ref >90)
GLUCOSE: 99 mg/dL (ref 70–99)
POTASSIUM: 3.4 meq/L — AB (ref 3.7–5.3)
Sodium: 145 mEq/L (ref 137–147)

## 2013-04-24 LAB — CBC
HCT: 31.5 % — ABNORMAL LOW (ref 36.0–46.0)
Hemoglobin: 9.6 g/dL — ABNORMAL LOW (ref 12.0–15.0)
MCH: 32.4 pg (ref 26.0–34.0)
MCHC: 30.5 g/dL (ref 30.0–36.0)
MCV: 106.4 fL — ABNORMAL HIGH (ref 78.0–100.0)
Platelets: 326 10*3/uL (ref 150–400)
RBC: 2.96 MIL/uL — ABNORMAL LOW (ref 3.87–5.11)
RDW: 19 % — ABNORMAL HIGH (ref 11.5–15.5)
WBC: 4.4 10*3/uL (ref 4.0–10.5)

## 2013-04-24 LAB — PROTIME-INR
INR: 1.04 (ref 0.00–1.49)
Prothrombin Time: 13.4 seconds (ref 11.6–15.2)

## 2013-04-24 LAB — APTT: aPTT: 31 seconds (ref 24–37)

## 2013-04-24 MED ORDER — IOHEXOL 300 MG/ML  SOLN: 50.0000 mL | Freq: Once | INTRAMUSCULAR | Status: AC | PRN

## 2013-04-24 MED ORDER — SODIUM CHLORIDE 0.9 % IV SOLN: INTRAVENOUS | Status: AC

## 2013-04-24 MED ORDER — MIDAZOLAM HCL 2 MG/2ML IJ SOLN
INTRAMUSCULAR | Status: AC | PRN
  Administered 2013-04-24 (×3): 1 mg via INTRAVENOUS

## 2013-04-24 MED ORDER — SODIUM CHLORIDE 0.9 % IV SOLN
Freq: Once | INTRAVENOUS | Status: AC
  Administered 2013-04-24: 09:00:00 via INTRAVENOUS

## 2013-04-24 MED ORDER — HEPARIN SOD (PORK) LOCK FLUSH 100 UNIT/ML IV SOLN
500.0000 [IU] | Freq: Once | INTRAVENOUS | Status: AC
  Administered 2013-04-24: 250 [IU] via INTRAVENOUS

## 2013-04-24 MED ORDER — PEGFILGRASTIM INJECTION 6 MG/0.6ML
6.0000 mg | Freq: Once | SUBCUTANEOUS | Status: AC
  Administered 2013-04-24: 6 mg via SUBCUTANEOUS
  Filled 2013-04-24: qty 0.6

## 2013-04-24 MED ORDER — TOBRAMYCIN SULFATE 1.2 G IJ SOLR
INTRAMUSCULAR | Status: AC
  Filled 2013-04-24: qty 1.2

## 2013-04-24 MED ORDER — FENTANYL CITRATE 0.05 MG/ML IJ SOLN
INTRAMUSCULAR | Status: AC | PRN
  Administered 2013-04-24 (×3): 25 ug via INTRAVENOUS

## 2013-04-24 MED ORDER — FENTANYL CITRATE 0.05 MG/ML IJ SOLN
INTRAMUSCULAR | Status: AC
  Filled 2013-04-24: qty 4

## 2013-04-24 MED ORDER — MIDAZOLAM HCL 2 MG/2ML IJ SOLN
INTRAMUSCULAR | Status: AC
  Filled 2013-04-24: qty 4

## 2013-04-24 MED ORDER — DEXTROSE 5 % IV SOLN
3.0000 g | Freq: Once | INTRAVENOUS | Status: DC
  Filled 2013-04-24: qty 3000

## 2013-04-24 NOTE — H&P (Signed)
[name] is an 48 y.o. female.   Chief Complaint: low back pain Pt has had vertebroplasty 02/26/13 for thoracic 11 and 12. Back never really seemed to decrease Another MRI was performed 12/4 at [place] which reveals fracture at Lumbar 1 and 2 These films have been reviewed by Dr Estanislado Pandy per pt and mother Scheduled now for L 1-2 VP/KP  HPI: Hx Ut Ca 2010 and 2014; morbid obese; recent RUE DVT  Past Medical History  Diagnosis Date  . Obesity   . DVT (deep venous thrombosis) 2009    RLE, tx for ~6 months  . PONV (postoperative nausea and vomiting)   . Swelling     BOTH LEGS  . Rash     LOWER ABD  . Gallstones   . Ascites   . Uterine cancer     Dr. Humphrey Rolls, chemotherapy  . Family history of anesthesia complication     MOTHER IS DIFFICULT TO WAKE  . Gallstones   . Cellulitis of leg, right     Past Surgical History  Procedure Laterality Date  . Ankle surgery  1990  . Wisdom tooth extraction    . Tonsillectomy and adenoidectomy    . Abdominal hysterectomy  12/2009    [facility], BSO  . Laparoscopy N/A 10/02/2012    Procedure: LAPAROSCOPY DIAGNOSTIC, perocentisis, omental biopsy;  Surgeon: Imogene Burn. Tsuei, MD;  Location: [facility];  Service: General;  Laterality: N/A;  removed a total of 15,000 of acities  . Paracentesis    . Kyphoplasty  02/26/2013    Family History  Problem Relation Age of Onset  . Heart disease Father   . Diabetes Brother   . Heart disease Mother   . Cirrhosis Father   . Diabetes Father   . Thyroid disease Mother   . Diabetes Mother   . Kidney disease Brother     from diabetes  . Breast cancer Maternal Aunt   . Breast cancer Maternal Aunt   . Melanoma Maternal Aunt   . Melanoma Maternal Aunt   . Other Maternal Aunt     lupus anticardiolipin ab syndrome   Social History:  reports that she has never smoked. She has never used smokeless tobacco. She reports that she drinks alcohol. She reports that she does not use illicit drugs.  Allergies: No  Known Allergies   (Not in a hospital admission)  Results for orders placed in visit on 04/22/13 (from the past 48 hour(s))  CBC WITH DIFFERENTIAL     Status: Abnormal   Collection Time    04/22/13 12:20 PM      Result Value Range   WBC 7.4  3.9 - 10.3 10e3/uL   NEUT# 5.2  1.5 - 6.5 10e3/uL   HGB 10.3 (*) 11.6 - 15.9 g/dL   HCT 34.3 (*) 34.8 - 46.6 %   Platelets 368  145 - 400 10e3/uL   MCV 105.5 (*) 79.5 - 101.0 fL   MCH 31.7  25.1 - 34.0 pg   MCHC 30.0 (*) 31.5 - 36.0 g/dL   RBC 3.25 (*) 3.70 - 5.45 10e6/uL   RDW 19.8 (*) 11.2 - 14.5 %   lymph# 1.3  0.9 - 3.3 10e3/uL   MONO# 0.8  0.1 - 0.9 10e3/uL   Eosinophils Absolute 0.0  0.0 - 0.5 10e3/uL   Basophils Absolute 0.0  0.0 - 0.1 10e3/uL   NEUT% 70.5  38.4 - 76.8 %   LYMPH% 18.2  14.0 - 49.7 %   MONO%  10.3  0.0 - 14.0 %   EOS% 0.5  0.0 - 7.0 %   BASO% 0.5  0.0 - 2.0 %   nRBC 0  0 - 0 %  COMPREHENSIVE METABOLIC PANEL (WC37)     Status: Abnormal   Collection Time    04/22/13 12:27 PM      Result Value Range   Sodium 142  136 - 145 mEq/L   Potassium 3.5  3.5 - 5.1 mEq/L   Chloride 103  98 - 109 mEq/L   CO2 24  22 - 29 mEq/L   Glucose 113  70 - 140 mg/dl   BUN 5.4 (*) 7.0 - 26.0 mg/dL   Creatinine 0.6  0.6 - 1.1 mg/dL   Total Bilirubin 0.48  0.20 - 1.20 mg/dL   Alkaline Phosphatase 86  40 - 150 U/L   AST 38 (*) 5 - 34 U/L   ALT 21  0 - 55 U/L   Total Protein 6.1 (*) 6.4 - 8.3 g/dL   Albumin 2.5 (*) 3.5 - 5.0 g/dL   Calcium 8.9  8.4 - 10.4 mg/dL   Anion Gap 15 (*) 3 - 11 mEq/L   No results found.  Review of Systems  Constitutional: Negative for fever and weight loss.  Respiratory: Negative for shortness of breath.   Cardiovascular: Negative for chest pain.  Gastrointestinal: Negative for nausea, vomiting and abdominal pain.  Musculoskeletal: Positive for back pain.  Neurological: Positive for weakness. Negative for headaches.    Blood pressure 98/62, pulse 94, temperature 97.5 F (36.4 C), temperature source  Oral, resp. rate 20, height 5\' 11"  (1.803 m), weight 424 lb (192.325 kg), SpO2 99.00%. Physical Exam  Constitutional: She appears well-nourished.  obese  Cardiovascular: Normal rate and regular rhythm.   No murmur heard. Respiratory: Effort normal and breath sounds normal. She has no wheezes.  GI: Soft. Bowel sounds are normal. There is no tenderness.  Musculoskeletal: Normal range of motion.  Pain in low back  Neurological: She is alert.  Skin: Skin is warm and dry.  Psychiatric: She has a normal mood and affect. Her behavior is normal. Judgment and thought content normal.     Assessment/Plan Back pain-continuous New fxs at L1 and L2 per MRI reviewed by Dr Estanislado Pandy Scheduled now for VP/KP Pt and family aware of procedure benefits and risks and agreeable to proceed Consent signed and in chart  Seymour A 04/24/2013, 8:56 AM

## 2013-04-24 NOTE — Procedures (Signed)
S/P L1 and L2 VP with biopsy

## 2013-04-24 NOTE — Progress Notes (Signed)
Pt in ambulance on stretcher. [facility] is taking her to [facility] for kyphoplasty and wanted to save her the transportation in and out of the ambulance. Double check name and DOB for injection.

## 2013-04-24 NOTE — Discharge Instructions (Signed)
1. No stooping,bending or lifting more than 10 lbs for 2 weeks. 2.Use walker to ambulate. 3.RTC in 2 weeks.   KYPHOPLASTY/VERTEBROPLASTY DISCHARGE INSTRUCTIONS  Medications: (check all that apply)     Resume all home medications as before procedure.             Continue your pain medications as prescribed as needed.  Over the next 3-5 days, decrease your pain medication as tolerated.  Over the counter medications (i.e. Tylenol, ibuprofen, and aleve) may be substituted once severe/moderate pain symptoms have subsided.   Wound Care:   Bandages may be removed the day following your procedure.  You may get your incision wet once bandages are removed.  Bandaids may be used to cover the incisions until scab formation.  Topical ointments are optional.    If you develop a fever greater than 101 degrees, have increased skin redness at the incision sites or pus-like oozing from incisions occurring within 1 week of the procedure, contact radiology at [phone] or [phone].    Ice pack to back for 15-20 minutes 2-3 time per day for first 2-3 days post procedure.  The ice will expedite muscle healing and help with the pain from the incisions.   Activity:   Bedrest today with limited activity for 24 hours post procedure.    No driving for 48 hours.    Increase your activity as tolerated after bedrest (with assistance if necessary).    Refrain from any strenuous activity or heavy lifting (greater than 10 lbs.).   Follow up:   Contact radiology at [phone] or [phone] if any questions/concerns.    A physician assistant from radiology will contact you in approximately 1 week.    If a biopsy was performed at the time of your procedure, your referring physician should receive the results in usually 2-3 days.

## 2013-04-29 ENCOUNTER — Ambulatory Visit: Payer: BC Managed Care – PPO

## 2013-04-29 ENCOUNTER — Telehealth: Payer: Self-pay | Admitting: Oncology

## 2013-04-29 ENCOUNTER — Telehealth: Payer: Self-pay | Admitting: *Deleted

## 2013-04-29 ENCOUNTER — Ambulatory Visit (HOSPITAL_COMMUNITY)
Admission: RE | Admit: 2013-04-29 | Discharge: 2013-04-29 | Disposition: A | Discharge status: Home / Self Care | Payer: BC Managed Care – PPO | Source: Ambulatory Visit | Attending: Internal Medicine | Admitting: Internal Medicine

## 2013-04-29 ENCOUNTER — Other Ambulatory Visit: Payer: Self-pay | Admitting: Adult Health

## 2013-04-29 ENCOUNTER — Encounter: Payer: Self-pay | Admitting: Adult Health

## 2013-04-29 ENCOUNTER — Other Ambulatory Visit (HOSPITAL_BASED_OUTPATIENT_CLINIC_OR_DEPARTMENT_OTHER): Payer: BC Managed Care – PPO

## 2013-04-29 ENCOUNTER — Ambulatory Visit (HOSPITAL_BASED_OUTPATIENT_CLINIC_OR_DEPARTMENT_OTHER): Payer: BC Managed Care – PPO | Admitting: Adult Health

## 2013-04-29 VITALS — BP 105/72 | HR 109 | Temp 97.6°F | Resp 20 | Ht 71.0 in | Wt >= 6400 oz

## 2013-04-29 DIAGNOSIS — N63 Unspecified lump in unspecified breast: Secondary | ICD-10-CM

## 2013-04-29 DIAGNOSIS — M79609 Pain in unspecified limb: Secondary | ICD-10-CM | POA: Insufficient documentation

## 2013-04-29 DIAGNOSIS — L539 Erythematous condition, unspecified: Secondary | ICD-10-CM | POA: Insufficient documentation

## 2013-04-29 DIAGNOSIS — I82401 Acute embolism and thrombosis of unspecified deep veins of right lower extremity: Secondary | ICD-10-CM

## 2013-04-29 DIAGNOSIS — R21 Rash and other nonspecific skin eruption: Secondary | ICD-10-CM

## 2013-04-29 DIAGNOSIS — M7989 Other specified soft tissue disorders: Secondary | ICD-10-CM

## 2013-04-29 DIAGNOSIS — R609 Edema, unspecified: Secondary | ICD-10-CM | POA: Insufficient documentation

## 2013-04-29 DIAGNOSIS — C549 Malignant neoplasm of corpus uteri, unspecified: Secondary | ICD-10-CM

## 2013-04-29 DIAGNOSIS — Z86718 Personal history of other venous thrombosis and embolism: Secondary | ICD-10-CM

## 2013-04-29 DIAGNOSIS — C541 Malignant neoplasm of endometrium: Secondary | ICD-10-CM

## 2013-04-29 DIAGNOSIS — E669 Obesity, unspecified: Secondary | ICD-10-CM

## 2013-04-29 DIAGNOSIS — C786 Secondary malignant neoplasm of retroperitoneum and peritoneum: Secondary | ICD-10-CM

## 2013-04-29 LAB — COMPREHENSIVE METABOLIC PANEL (CC13)
ALBUMIN: 2.9 g/dL — AB (ref 3.5–5.0)
ALT: 27 U/L (ref 0–55)
AST: 31 U/L (ref 5–34)
Alkaline Phosphatase: 109 U/L (ref 40–150)
Anion Gap: 11 mEq/L (ref 3–11)
BUN: 3.3 mg/dL — ABNORMAL LOW (ref 7.0–26.0)
CALCIUM: 9.2 mg/dL (ref 8.4–10.4)
CHLORIDE: 96 meq/L — AB (ref 98–109)
CO2: 32 mEq/L — ABNORMAL HIGH (ref 22–29)
CREATININE: 0.6 mg/dL (ref 0.6–1.1)
Glucose: 115 mg/dl (ref 70–140)
POTASSIUM: 3.2 meq/L — AB (ref 3.5–5.1)
SODIUM: 139 meq/L (ref 136–145)
TOTAL PROTEIN: 6.6 g/dL (ref 6.4–8.3)
Total Bilirubin: 0.48 mg/dL (ref 0.20–1.20)

## 2013-04-29 LAB — CBC WITH DIFFERENTIAL/PLATELET
BASO%: 0.9 % (ref 0.0–2.0)
Basophils Absolute: 0 10*3/uL (ref 0.0–0.1)
EOS%: 1.3 % (ref 0.0–7.0)
Eosinophils Absolute: 0 10*3/uL (ref 0.0–0.5)
HCT: 31.6 % — ABNORMAL LOW (ref 34.8–46.6)
HGB: 10.3 g/dL — ABNORMAL LOW (ref 11.6–15.9)
LYMPH%: 17.9 % (ref 14.0–49.7)
MCH: 32.8 pg (ref 25.1–34.0)
MCHC: 32.4 g/dL (ref 31.5–36.0)
MCV: 101.1 fL — ABNORMAL HIGH (ref 79.5–101.0)
MONO#: 0.5 10*3/uL (ref 0.1–0.9)
MONO%: 13.4 % (ref 0.0–14.0)
NEUT#: 2.5 10*3/uL (ref 1.5–6.5)
NEUT%: 66.5 % (ref 38.4–76.8)
Platelets: 203 10*3/uL (ref 145–400)
RBC: 3.13 10*6/uL — AB (ref 3.70–5.45)
RDW: 21.1 % — AB (ref 11.2–14.5)
WBC: 3.7 10*3/uL — ABNORMAL LOW (ref 3.9–10.3)
lymph#: 0.7 10*3/uL — ABNORMAL LOW (ref 0.9–3.3)

## 2013-04-29 LAB — HEPARIN ANTI-XA: Heparin LMW: 0.89 IU/mL

## 2013-04-29 NOTE — Telephone Encounter (Addendum)
Jenny Reichmann called requesting if U/S is left or right, upper or lower venous doppler.  Feels the call request she received and the order entered vary.  Collaborative nurse notified of request, will contact Mendel Ryder and return call to doppler lab.

## 2013-04-29 NOTE — Telephone Encounter (Signed)
, °

## 2013-04-29 NOTE — Progress Notes (Signed)
VASCULAR LAB PRELIMINARY  PRELIMINARY  PRELIMINARY  PRELIMINARY  Left upper extremity venous duplex completed.    Preliminary report: No obvious evidence of DVT or superficial thrombosis  [name], RVS 04/29/2013, 4:23 PM

## 2013-04-29 NOTE — Progress Notes (Addendum)
[place]  [phone] [phone]  OFFICE PROGRESS NOTE    PATIENT: [name]   DOB: 04-16-1966  MR#: [id number]  [id number]   [id number], Catherine Main, [name] GYN ONC: [name], M.D. SU:  [name], M.D.    DIAGNOSIS: [name] is a 48 y.o. female with a prior history of stage IA uterine cancer diagnosed in 2010.  Now with recurrent disease diagnosed in 08/2012.  PRIOR THERAPY: #1  Patient developed excessive uterine bleeding.  She underwent dilatation and curettage with hysteroscopy. 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 Due to morbid obesity Mirena IUD was placed along with Aygestin. Patient had a dramatic directed weight loss with nutritional support she went from 523 pounds to 359 pounds in 10/2009.  On 12/27/2009 she underwent a robotic-assisted laparoscopic hysterectomy, bilateral salpingo-oophorectomy, and mini laparotomy through the umbilical port with morcellation of uterus within about the delivery of the uterus. The final pathology revealed an endometrial adenocarcinoma grade 2 with invasion limited to 1 mm of the myometrium.  #2  Patient continued to do well until 09/2012 when she developed right upper quadrant pain. It was presumed to be cholelithiasis. 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 Omental biopsies were obtained. The pathology was consistent with metastatic adenocarcinoma with the presumption of recurrent endometrial carcinoma.  She was seen by Dr. Janie Marquez on 10/07/2012.  Dr. Skeet Latch recommended that [name] undergo chemotherapy consisting of Taxol and Carboplatinum.  Chemotherapy started on 11/04/2012.   #3 Abdominal ascites with multiple paracenteses.  #4 Back pain and subsequent MRI on 02/05/2013 revealed compression fractures of T10 and T11.    #5 Status post kyphoplasty with Dr. Luanne Bras on 02/26/2013.  Patient states her back pain continues.  #6 Patient hospitalized and underwent more MRI's on 12/4 and L1 and L2 compression fractures were noted, after consultation with Dr. Sherwood Marquez, the patient was referred back to interventional radiology for evaluation for kyphoplasty and this was performed on 04/24/13.    #7 Patient diagnosed with right upper extremity DVT on 04/13/13.  She was initially admitted to the hospital and started on a heparin gtt to remove her port.  She was then transitioned to Enoxaparin BID.     CURRENT THERAPY:  Taxol/Carboplatin given every 21 days with 8 cycles planned.  Cycle 8 day 8.  INTERVAL HISTORY: [name] returns today for evaluation after receiving her eighth and final planned cycle of Taxol/Carboplatin.  Her back is feeling improved since her kyphoplasty.  She is c/o of a rash on her left hand, mid abdomen, and right breast. The rash on the left hand is scaly and started this Marquez after taking a bath.  The redness on her mid abdomen began around the same time. She also continues to have swelling and redness of her right breast that has been present since she was diagnosed with a right upper extremity DVT on 04/13/2013.   She denies any fevers.  She is mildly nauseated and fatigued.  Her PICC line needs to be flushed.  Otherwise, she is doing well, and a 10 point ROS is negative.    PAST MEDICAL HISTORY: Past Medical History  Diagnosis Date  . Obesity   . DVT (deep venous thrombosis) 2009    RLE, tx for ~6 months  . PONV (postoperative nausea and vomiting)   . Swelling  BOTH LEGS  . Rash     LOWER ABD  . Gallstones   . Ascites   . Uterine cancer     Dr. Humphrey Rolls, chemotherapy  . Family history of anesthesia complication     MOTHER IS DIFFICULT TO WAKE  . Gallstones   . Cellulitis of leg, right     PAST SURGICAL HISTORY: Past Surgical History  Procedure Laterality Date  .  Ankle surgery  1990  . Wisdom tooth extraction    . Tonsillectomy and adenoidectomy    . Abdominal hysterectomy  12/2009    [facility], B[facility]  . Laparoscopy N/A 10/02/2012    Procedure: LAPAROSCOPY DIAGNOSTIC, perocentisis, omental biopsy;  Surgeon: Imogene Burn. Tsuei, [name];  Location: [facility];  Service: General;  Laterality: N/A;  removed a total of 15,000 of acities  . Paracentesis    . Kyphoplasty  02/26/2013    FAMILY HISTORY: Family History  Problem Relation Age of Onset  . Heart disease Father   . Diabetes Brother   . Heart disease Mother   . Cirrhosis Father   . Diabetes Father   . Thyroid disease Mother   . Diabetes Mother   . Kidney disease Brother     from diabetes  . Breast cancer Maternal Aunt   . Breast cancer Maternal Aunt   . Melanoma Maternal Aunt   . Melanoma Maternal Aunt   . Other Maternal Aunt     lupus anticardiolipin ab syndrome    SOCIAL HISTORY: History  Substance Use Topics  . Smoking status: Never Smoker   . Smokeless tobacco: Never Used  . Alcohol Use: Yes     Comment: social, Occassionally    ALLERGIES: No Known Allergies   MEDICATIONS:  Current Outpatient Prescriptions  Medication Sig Dispense Refill  . Ascorbic Acid (VITAMIN C) 1000 MG tablet Take 1,000 mg by mouth daily.      Marland Kitchen b complex vitamins capsule Take 1 capsule by mouth daily.      . Cyanocobalamin (VITAMIN B-12 PO) Take 2,500 mg by mouth daily.       . cyclobenzaprine (FLEXERIL) 5 MG tablet Take 5 mg by mouth 3 (three) times daily as needed for muscle spasms.      Marland Kitchen dextromethorphan-guaiFENesin (MUCINEX DM) 30-600 MG per 12 hr tablet Take 1 tablet by mouth 2 (two) times daily.      Marland Kitchen enoxaparin (LOVENOX) 150 MG/ML injection Inject 1.27 mLs (190 mg total) into the skin every 12 (twelve) hours.  28 Syringe  0  . ferrous sulfate 325 (65 FE) MG tablet Take 1 tablet (325 mg total) by mouth 3 (three) times daily with meals.  90 tablet  2  . folic acid (FOLVITE) A999333 MCG tablet Take 400 mcg by  mouth daily.        . furosemide (LASIX) 20 MG tablet Take 1 tablet (20 mg total) by mouth every other day.  30 tablet  0  . lidocaine-prilocaine (EMLA) cream Apply 1 application topically as needed. Apply to port-a-cath site 1-2 hours prior to treatment.      Marland Kitchen loratadine (CLARITIN) 10 MG tablet Take 10 mg by mouth daily.      . magnesium gluconate (MAGONATE) 500 MG tablet Take 500 mg by mouth daily.      . miconazole nitrate (MICATIN) POWD Apply 1 application topically 4 (four) times daily.  10 g  2  . Multiple Vitamins-Minerals (MULTIVITAMIN WITH MINERALS) tablet Take 1 tablet by mouth daily.       Marland Kitchen  nystatin ointment (MYCOSTATIN) Apply 1 application topically 2 (two) times daily.      . ondansetron (ZOFRAN) 8 MG tablet Take 8 mg by mouth 2 (two) times daily. Take two times a day starting the day after chemo for 3 days. Then take two times a day as needed for nausea or vomiting.      Marland Kitchen oxyCODONE-acetaminophen (PERCOCET) 10-325 MG per tablet Take 1 tablet by mouth every 4 (four) hours as needed for pain.  30 tablet  0  . PACLitaxel (TAXOL IV) Inject 1 each into the vein every 21 ( twenty-one) days.      . pantoprazole (PROTONIX) 40 MG tablet Take 40 mg by mouth daily.      . pegfilgrastim (NEULASTA) 6 MG/0.6ML injection Inject 6 mg into the skin every 21 ( twenty-one) days.      Marland Kitchen PRESCRIPTION MEDICATION every 21 ( twenty-one) days. CHEMOTHERAPY REGIMEN      . prochlorperazine (COMPAZINE) 10 MG tablet Take 10 mg by mouth every 6 (six) hours as needed (Nausea or vomiting).      . sucralfate (CARAFATE) 1 GM/10ML suspension Take 10 mLs (1 g total) by mouth 4 (four) times daily.  420 mL  2  . potassium chloride (K-DUR) 10 MEQ tablet Take 2 tablets (20 mEq total) by mouth daily.  14 tablet  0  . promethazine (PHENERGAN) 25 MG tablet TAKE 1/2 TABLET (12.5 MG TOTAL) BY MOUTH EVERY 6 (SIX) HOURS AS NEEDED FOR NAUSEA.  15 tablet  0   No current facility-administered medications for this visit.       REVIEW OF SYSTEMS: A 10 point review of systems was completed and is negative except as noted above.   PHYSICAL EXAMINATION: BP 105/72  Pulse 109  Temp(Src) 97.6 F (36.4 C) (Oral)  Resp 20  Ht 5\' 11"  (1.803 m)  Wt 417 lb 1.6 oz (189.195 kg)  BMI 58.20 kg/m2  General: Patient is a well appearing female in no acute distress HEENT: PERRLA, sclerae anicteric no conjunctival pallor, MMM Neck: supple, no palpable adenopathy Lungs: clear to auscultation bilaterally, no wheezes, rhonchi, or rales Cardiovascular: regular rate rhythm, S1, S2, no murmurs, rubs or gallops Abdomen: Soft, non-tender, non-distended, normoactive bowel sounds, no HSM, limited exam patient morbidly obese, erythema to mid abdomen, no tenderness or warmrth Extremities: warm and well perfused, no clubbing, cyanosis, or edema, strength 5/5 all extremities Skin: no rashes or lesions, right breast with erythema and swelling, left hand with plaque like erythema on fingertips and fingers up to knuckles of all 5 digits.  No warmth, tenderness Neuro: Non-focal ECOG FS:  3 - Symptomatic, >50% confined to bed   LAB RESULTS: Lab Results  Component Value Date   WBC 3.7* 04/29/2013   NEUTROABS 2.5 04/29/2013   HGB 10.3* 04/29/2013   HCT 31.6* 04/29/2013   MCV 101.1* 04/29/2013   PLT 203 04/29/2013      Chemistry      Component Value Date/Time   NA 139 04/29/2013 1111   NA 145 04/24/2013 0824   K 3.2* 04/29/2013 1111   K 3.4* 04/24/2013 0824   CL 104 04/24/2013 0824   CL 98 10/07/2012 0923   CO2 32* 04/29/2013 1111   CO2 31 04/24/2013 0824   BUN 3.3* 04/29/2013 1111   BUN 6 04/24/2013 0824   CREATININE 0.6 04/29/2013 1111   CREATININE 0.47* 04/24/2013 0824      Component Value Date/Time   CALCIUM 9.2 04/29/2013 1111   CALCIUM 8.1* 04/24/2013  0824   ALKPHOS 109 04/29/2013 1111   ALKPHOS 179* 03/12/2013 0845   AST 31 04/29/2013 1111   AST 42* 03/12/2013 0845   ALT 27 04/29/2013 1111   ALT 53* 03/12/2013 0845   BILITOT 0.48 04/29/2013 1111    BILITOT 0.5 03/12/2013 0845       No results found for this basename: [id number]    No components found with this basename: [id number]     RADIOGRAPHIC STUDIES: Ir Vertebroplasty Or Sacroplasty 03/02/2013   CLINICAL DATA:  Patient with severely painful compression fracture at T10 and T11. History of endometrial carcinoma. On chemotherapy.  EXAM: VERTEBROPLASTY FL AT T10 and T11.  CORE BIOPSIES OBTAINED  MEDICATIONS: Versed 4 mg. mg IV, Fentanyl 100  mcg IV.  ANESTHESIA/SEDATION: Total Moderate Sedation Time:  35 min.  FLUOROSCOPY TIME:  8 min 48 seconds. Marland Kitchen  PROCEDURE: Following a full explanation of the procedure along with the potentially associated complications, a witnessed informed consent was obtained.  The patient was placed prone on the fluoroscopic table. Nasal oxygen was administered. Physiologic monitoring was performed throughout the duration of the procedure. The skin overlying the thoracic region was prepped and draped in the usual sterile fashion. The T10 and T11 vertebral bodies were identified and the right pedicle at T10, and the left pedicle at T11 were infiltrated with 0.25% Bupivacaine. This was then followed by the advancement of a 13-gauge Cook needle through both the pedicles into the anterior one-third at both levels. Prior to this, 18 gauge core biopsy needles were used to obtain 4 samples of both the levels using a 20 mL syringe. These were sent for pathologic analysis. A gentle contrast injection demonstrated a trabecular pattern of contrast.  At this time, methylmethacrylate mixture was reconstituted. Under biplane intermittent fluoroscopy, the methylmethacrylate was then injected into the T10 and T11 vertebral body with filling of the vertebral body at T10 and T12.  Mild extravasation was noted into the disk spaces through the inferior endplate fracture clefts . No epidural venous contamination was seen.  The needles were then removed. Hemostasis was achieved at the skin entry  site.  There were no acute complications. Patient tolerated the procedure well. The patient was observed for 3 hours and discharged in good condition.  IMPRESSION: Status post vertebral body augmentation for painful compression fracture at T10 and T11 using vertebroplasty technique.  Core biopsies obtained at T10 and T11. Sample sent for pathologic analysis.   Electronically Signed   By: Catherine Bras M.D.   On: 02/26/2013 13:10      ASSESSMENT: [name] is a 48 y.o. woman:  #1 Recurrent uterine cancer with peritoneal carcinomatosis and ascites.  Port a cath was placed in the OR, and the patient has been treated in the palliative setting with Taxol and Carboplatinum that started on 11/04/2012.    #2 Ascites: History of having paracenteses 1-2 times per week, she hasn't received/required a paracentesis since 11/24/2012.  #3 Back pain - Compression fractures at T10 and T11 per MRI on 02/05/2013.  Status post kyphoplasty on 02/26/2013.  Patient with repeat MRI on 12/4 demonstrating compression fractures at L1 and L2.  Patient underwent L1 and L2 kyphoplasty on 04/24/12.    #4 Patient was diagnosed with a  Right upper extremity DVT on 04/13/13.  She was started on a Heparin gtt and admitted to the hospital where she underwent right port removal and transitioned to Lovenox BID.  She is tolerating the Lovenox well.  PLAN: #1  Ms. Grandmaison is doing relatively well today.  Her CBC is stable.  She is nauseated today and weak.  I offered her IV fluids and anti-emetics today, but she declined.  She did have her PICC line flushed by our nurse.    #2  She is tolerating the lovenox well.  She denies easy bruising/bleeding.  She will continue BID dosing of the Lovenox and we will check a level today. Once I have the results of the level and discuss her dosing with pharmacy, I will refill her Lovenox.    #3  After Dr. Humphrey Rolls evaluated the skin lesions, we will get a doppler of the left upper extremity  to evaluate for DVT, and a mammogram to evaluate the right breast.      #4  She will undergo a CT chest/abdomen/pelvis on 05/04/13 and will discuss these results with Dr. Humphrey Rolls on 05/13/13.      All questions were answered. The patient and her family were encouraged to contact us in the interim with any problems, questions or concerns.   I spent 25 minutes counseling the patient face to face.  The total time spent in the appointment was 30 minutes.  Minette Headland, [place] [phone] 04/30/2013  10:21 PM  ATTENDING'S ATTESTATION:  I personally reviewed patient's chart, examined patient myself, formulated the treatment plan as followed.    Overall patient is doing well.Has completed 8 cycles of Taxol and carboplatin. We will obtain scans for re-staging purposes and further treatment planning.   Left hand rash unclear cause will need to evalaute further with doppler of the left upper extremity. Currently on lovenox for right upper dvt.   Will be seen in 1 week for follow up and discussion of scan results.  Marcy Panning, [name] Medical/Oncology [facility] [phone] (beeper) [phone] (Office)  05/16/2013, 1:40 PM

## 2013-04-30 ENCOUNTER — Telehealth: Payer: Self-pay | Admitting: *Deleted

## 2013-04-30 NOTE — Telephone Encounter (Signed)
F/U  with patient to check on her from yesterday's visit. Lt hand is unchanged. Still red blotchy  rash and swollen to wrist. Back pain is better. Informed pt of Potassium level. She had ran out of this medicine. Rx will be sent to pharmacy for her. She will restart antibiotic Keflex 500mg , 1 cap PO 4 times a day to clear up urine. Pt and her Mom verbalized understanding.Message will be forwarded to Charlestine Massed, NP.

## 2013-05-01 ENCOUNTER — Other Ambulatory Visit: Payer: Self-pay | Admitting: Adult Health

## 2013-05-01 DIAGNOSIS — E876 Hypokalemia: Secondary | ICD-10-CM

## 2013-05-01 DIAGNOSIS — I82621 Acute embolism and thrombosis of deep veins of right upper extremity: Secondary | ICD-10-CM

## 2013-05-01 MED ORDER — POTASSIUM CHLORIDE ER 10 MEQ PO TBCR: 20.0000 meq | EXTENDED_RELEASE_TABLET | Freq: Every day | ORAL | Status: DC

## 2013-05-01 MED ORDER — ENOXAPARIN SODIUM 150 MG/ML ~~LOC~~ SOLN: 190.0000 mg | Freq: Two times a day (BID) | SUBCUTANEOUS | Status: DC

## 2013-05-04 ENCOUNTER — Other Ambulatory Visit: Payer: BC Managed Care – PPO

## 2013-05-04 ENCOUNTER — Other Ambulatory Visit (HOSPITAL_BASED_OUTPATIENT_CLINIC_OR_DEPARTMENT_OTHER): Payer: BC Managed Care – PPO

## 2013-05-04 ENCOUNTER — Other Ambulatory Visit: Payer: Self-pay | Admitting: *Deleted

## 2013-05-04 ENCOUNTER — Other Ambulatory Visit: Payer: Self-pay | Admitting: Oncology

## 2013-05-04 ENCOUNTER — Ambulatory Visit (HOSPITAL_COMMUNITY)
Admission: RE | Admit: 2013-05-04 | Discharge: 2013-05-04 | Disposition: A | Discharge status: Home / Self Care | Payer: BC Managed Care – PPO | Source: Ambulatory Visit | Attending: Adult Health | Admitting: Adult Health

## 2013-05-04 DIAGNOSIS — K669 Disorder of peritoneum, unspecified: Secondary | ICD-10-CM | POA: Insufficient documentation

## 2013-05-04 DIAGNOSIS — Z79899 Other long term (current) drug therapy: Secondary | ICD-10-CM | POA: Insufficient documentation

## 2013-05-04 DIAGNOSIS — R188 Other ascites: Secondary | ICD-10-CM | POA: Insufficient documentation

## 2013-05-04 DIAGNOSIS — C549 Malignant neoplasm of corpus uteri, unspecified: Secondary | ICD-10-CM | POA: Insufficient documentation

## 2013-05-04 DIAGNOSIS — C541 Malignant neoplasm of endometrium: Secondary | ICD-10-CM

## 2013-05-04 DIAGNOSIS — M8448XA Pathological fracture, other site, initial encounter for fracture: Secondary | ICD-10-CM | POA: Insufficient documentation

## 2013-05-04 DIAGNOSIS — K7689 Other specified diseases of liver: Secondary | ICD-10-CM | POA: Insufficient documentation

## 2013-05-04 LAB — CBC WITH DIFFERENTIAL/PLATELET
BASO%: 0.5 % (ref 0.0–2.0)
Basophils Absolute: 0 10*3/uL (ref 0.0–0.1)
EOS%: 0.8 % (ref 0.0–7.0)
Eosinophils Absolute: 0 10*3/uL (ref 0.0–0.5)
HEMATOCRIT: 33.5 % — AB (ref 34.8–46.6)
HGB: 10.7 g/dL — ABNORMAL LOW (ref 11.6–15.9)
LYMPH#: 1.2 10*3/uL (ref 0.9–3.3)
LYMPH%: 21.9 % (ref 14.0–49.7)
MCH: 32.4 pg (ref 25.1–34.0)
MCHC: 31.9 g/dL (ref 31.5–36.0)
MCV: 101.6 fL — ABNORMAL HIGH (ref 79.5–101.0)
MONO#: 0.6 10*3/uL (ref 0.1–0.9)
MONO%: 10.2 % (ref 0.0–14.0)
NEUT#: 3.8 10*3/uL (ref 1.5–6.5)
NEUT%: 66.6 % (ref 38.4–76.8)
Platelets: 195 10*3/uL (ref 145–400)
RBC: 3.3 10*6/uL — ABNORMAL LOW (ref 3.70–5.45)
RDW: 21.5 % — AB (ref 11.2–14.5)
WBC: 5.6 10*3/uL (ref 3.9–10.3)

## 2013-05-04 LAB — COMPREHENSIVE METABOLIC PANEL (CC13)
ALBUMIN: 2.7 g/dL — AB (ref 3.5–5.0)
ALT: 21 U/L (ref 0–55)
ANION GAP: 15 meq/L — AB (ref 3–11)
AST: 24 U/L (ref 5–34)
Alkaline Phosphatase: 82 U/L (ref 40–150)
BUN: 6.4 mg/dL — ABNORMAL LOW (ref 7.0–26.0)
CHLORIDE: 98 meq/L (ref 98–109)
CO2: 28 meq/L (ref 22–29)
CREATININE: 0.6 mg/dL (ref 0.6–1.1)
Calcium: 8.1 mg/dL — ABNORMAL LOW (ref 8.4–10.4)
Glucose: 112 mg/dl (ref 70–140)
Potassium: 3.3 mEq/L — ABNORMAL LOW (ref 3.5–5.1)
Sodium: 141 mEq/L (ref 136–145)
Total Bilirubin: 0.35 mg/dL (ref 0.20–1.20)
Total Protein: 6.5 g/dL (ref 6.4–8.3)

## 2013-05-04 MED ORDER — IOHEXOL 300 MG/ML  SOLN
50.0000 mL | Freq: Once | INTRAMUSCULAR | Status: AC | PRN
  Administered 2013-05-04: 50 mL via ORAL

## 2013-05-04 MED ORDER — IOHEXOL 300 MG/ML  SOLN
125.0000 mL | Freq: Once | INTRAMUSCULAR | Status: AC | PRN
  Administered 2013-05-04: 125 mL via INTRAVENOUS

## 2013-05-04 MED ORDER — PROCHLORPERAZINE MALEATE 10 MG PO TABS: 10.0000 mg | ORAL_TABLET | Freq: Four times a day (QID) | ORAL | Status: DC | PRN

## 2013-05-06 ENCOUNTER — Other Ambulatory Visit: Payer: BC Managed Care – PPO

## 2013-05-13 ENCOUNTER — Ambulatory Visit
Admission: RE | Admit: 2013-05-13 | Discharge: 2013-05-13 | Disposition: A | Discharge status: Home / Self Care | Payer: BC Managed Care – PPO | Source: Ambulatory Visit | Attending: Adult Health | Admitting: Adult Health

## 2013-05-13 ENCOUNTER — Other Ambulatory Visit (HOSPITAL_COMMUNITY): Payer: Self-pay | Admitting: Interventional Radiology

## 2013-05-13 DIAGNOSIS — N63 Unspecified lump in unspecified breast: Secondary | ICD-10-CM

## 2013-05-13 DIAGNOSIS — IMO0002 Reserved for concepts with insufficient information to code with codable children: Secondary | ICD-10-CM

## 2013-05-14 ENCOUNTER — Other Ambulatory Visit: Payer: Self-pay | Admitting: *Deleted

## 2013-05-14 ENCOUNTER — Encounter: Payer: Self-pay | Admitting: Oncology

## 2013-05-14 ENCOUNTER — Telehealth: Payer: Self-pay | Admitting: Oncology

## 2013-05-14 ENCOUNTER — Ambulatory Visit (HOSPITAL_BASED_OUTPATIENT_CLINIC_OR_DEPARTMENT_OTHER): Payer: BC Managed Care – PPO | Admitting: Oncology

## 2013-05-14 VITALS — BP 106/72 | HR 104 | Temp 97.4°F | Resp 20 | Ht 71.0 in | Wt >= 6400 oz

## 2013-05-14 DIAGNOSIS — N62 Hypertrophy of breast: Secondary | ICD-10-CM

## 2013-05-14 DIAGNOSIS — R188 Other ascites: Secondary | ICD-10-CM

## 2013-05-14 DIAGNOSIS — C541 Malignant neoplasm of endometrium: Secondary | ICD-10-CM

## 2013-05-14 DIAGNOSIS — C549 Malignant neoplasm of corpus uteri, unspecified: Secondary | ICD-10-CM

## 2013-05-14 DIAGNOSIS — C786 Secondary malignant neoplasm of retroperitoneum and peritoneum: Secondary | ICD-10-CM

## 2013-05-14 DIAGNOSIS — R21 Rash and other nonspecific skin eruption: Secondary | ICD-10-CM

## 2013-05-14 DIAGNOSIS — Z86718 Personal history of other venous thrombosis and embolism: Secondary | ICD-10-CM

## 2013-05-14 MED ORDER — PROMETHAZINE HCL 25 MG PO TABS: ORAL_TABLET | ORAL | Status: DC

## 2013-05-14 MED ORDER — ONDANSETRON HCL 8 MG PO TABS: 8.0000 mg | ORAL_TABLET | Freq: Two times a day (BID) | ORAL | Status: DC

## 2013-05-14 MED ORDER — PROCHLORPERAZINE MALEATE 10 MG PO TABS: 10.0000 mg | ORAL_TABLET | Freq: Four times a day (QID) | ORAL | Status: DC | PRN

## 2013-05-14 MED ORDER — OXYCODONE-ACETAMINOPHEN 10-325 MG PO TABS: 1.0000 | ORAL_TABLET | ORAL | Status: DC | PRN

## 2013-05-14 NOTE — Progress Notes (Signed)
[facility]  [phone] [phone]  OFFICE PROGRESS NOTE    PATIENT: [name]   DOB: 1965-10-29  MR#: [id number]  [id number]   [id number], [name], MD GYN ONC: Janie Morning, M.D. SU:  Donnie Mesa, M.D.    DIAGNOSIS: [name] is a 48 y.o. female with a prior history of stage IA uterine cancer diagnosed in 2010.  Now with recurrent disease diagnosed in 08/2012.  PRIOR THERAPY: #1  Patient developed excessive uterine bleeding.  She underwent dilatation and curettage with hysteroscopy. 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 Due to morbid obesity Mirena IUD was placed along with Aygestin. Patient had a dramatic directed weight loss with nutritional support she went from 523 pounds to 359 pounds in 10/2009.  On 12/27/2009 she underwent a robotic-assisted laparoscopic hysterectomy, bilateral salpingo-oophorectomy, and mini laparotomy through the umbilical port with morcellation of uterus within about the delivery of the uterus. The final pathology revealed an endometrial adenocarcinoma grade 2 with invasion limited to 1 mm of the myometrium.  #2  Patient continued to do well until 09/2012 when she developed right upper quadrant pain. It was presumed to be cholelithiasis. 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 Omental biopsies were obtained. The pathology was consistent with metastatic adenocarcinoma with the presumption of recurrent endometrial carcinoma.  She was seen by Dr. Janie Morning on 10/07/2012.  Dr. Skeet Latch recommended that Ms. Stevison undergo chemotherapy consisting of Taxol and Carboplatinum.  Chemotherapy started on 11/04/2012.   #3 Abdominal ascites with multiple paracenteses.  #4 Back pain and subsequent MRI on 02/05/2013 revealed compression fractures of T10 and T11.    #5 Status post kyphoplasty with Dr. Luanne Bras on 02/26/2013.  Patient states her back pain continues.  #6 Patient hospitalized and underwent more MRI's on 12/4 and L1 and L2 compression fractures were noted, after consultation with Dr. Sherwood Gambler, the patient was referred back to interventional radiology for evaluation for kyphoplasty and this was performed on 04/24/13.    #7 Patient diagnosed with right upper extremity DVT on 04/13/13.  She was initially admitted to the hospital and started on a heparin gtt to remove her port.  She was then transitioned to Enoxaparin BID.     CURRENT THERAPY:  Status post 8 cycles of Taxol carboplatinum  INTERVAL HISTORY: [name] returns today. She had restaging CT scans performed. We did discuss those. She has had a little bit of response in her disease. Certainly she has had a response in significant reduction in ascites. Clinically today she feels well. Her pain is much better improved. She does have a followup with IR later on in the week. She is denying any headaches double vision blurring of vision fevers chills night sweats no shortness of breath no chest pains or palpitations no myalgias or arthralgias. She does have swelling of the lower extremities which is chronic and ongoing. Her right breast does look much better as does her right upper extremity. She is tolerating the Lovenox very nicely. She has not had any bleeding problems. Remainder of the 10 point review of systems is negative.  PAST MEDICAL HISTORY: Past Medical History  Diagnosis Date  . Obesity   . DVT (deep venous thrombosis) 2009    RLE, tx for ~6 months  . PONV (postoperative nausea and vomiting)   . Swelling     BOTH LEGS  .  Rash     LOWER ABD  . Gallstones   . Ascites   . Uterine cancer     Dr. Humphrey Rolls, chemotherapy  . Family history of anesthesia complication     MOTHER IS DIFFICULT TO WAKE  . Gallstones   . Cellulitis of leg, right     PAST SURGICAL  HISTORY: Past Surgical History  Procedure Laterality Date  . Ankle surgery  1990  . Wisdom tooth extraction    . Tonsillectomy and adenoidectomy    . Abdominal hysterectomy  12/2009    [facility], [facility]  . Laparoscopy N/A 10/02/2012    Procedure: LAPAROSCOPY DIAGNOSTIC, perocentisis, omental biopsy;  Surgeon: Imogene Burn. Tsuei, MD;  Location: [facility];  Service: General;  Laterality: N/A;  removed a total of 15,000 of acities  . Paracentesis    . Kyphoplasty  02/26/2013    FAMILY HISTORY: Family History  Problem Relation Age of Onset  . Heart disease Father   . Diabetes Brother   . Heart disease Mother   . Cirrhosis Father   . Diabetes Father   . Thyroid disease Mother   . Diabetes Mother   . Kidney disease Brother     from diabetes  . Breast cancer Maternal Aunt   . Breast cancer Maternal Aunt   . Melanoma Maternal Aunt   . Melanoma Maternal Aunt   . Other Maternal Aunt     lupus anticardiolipin ab syndrome    SOCIAL HISTORY: History  Substance Use Topics  . Smoking status: Never Smoker   . Smokeless tobacco: Never Used  . Alcohol Use: Yes     Comment: social, Occassionally    ALLERGIES: No Known Allergies   MEDICATIONS:  Current Outpatient Prescriptions  Medication Sig Dispense Refill  . Ascorbic Acid (VITAMIN C) 1000 MG tablet Take 1,000 mg by mouth daily.      Marland Kitchen b complex vitamins capsule Take 1 capsule by mouth daily.      . Cyanocobalamin (VITAMIN B-12 PO) Take 2,500 mg by mouth daily.       . cyclobenzaprine (FLEXERIL) 5 MG tablet TAKE 1 TABLET BY MOUTH 3 TIMES A DAY AS NEEDED FOR MUSCLE SPASMS  60 tablet  1  . enoxaparin (LOVENOX) 150 MG/ML injection Inject 1.27 mLs (190 mg total) into the skin every 12 (twelve) hours.  30 Syringe  0  . ferrous sulfate 325 (65 FE) MG tablet Take 1 tablet (325 mg total) by mouth 3 (three) times daily with meals.  90 tablet  2  . folic acid (FOLVITE) A999333 MCG tablet Take 400 mcg by mouth daily.        . furosemide (LASIX) 20 MG tablet  Take 1 tablet (20 mg total) by mouth every other day.  30 tablet  0  . loratadine (CLARITIN) 10 MG tablet Take 10 mg by mouth daily.      . magnesium gluconate (MAGONATE) 500 MG tablet Take 500 mg by mouth daily.      . miconazole nitrate (MICATIN) POWD Apply 1 application topically 4 (four) times daily.  10 g  2  . Multiple Vitamins-Minerals (MULTIVITAMIN WITH MINERALS) tablet Take 1 tablet by mouth daily.       Marland Kitchen nystatin ointment (MYCOSTATIN) Apply 1 application topically 2 (two) times daily.      . ondansetron (ZOFRAN) 8 MG tablet Take 8 mg by mouth 2 (two) times daily. Take two times a day starting the day after chemo for 3 days. Then take two times a  day as needed for nausea or vomiting.      Marland Kitchen oxyCODONE-acetaminophen (PERCOCET) 10-325 MG per tablet Take 1 tablet by mouth every 4 (four) hours as needed for pain.  30 tablet  0  . PACLitaxel (TAXOL IV) Inject 1 each into the vein every 21 ( twenty-one) days.      . pantoprazole (PROTONIX) 40 MG tablet Take 40 mg by mouth daily.      . pegfilgrastim (NEULASTA) 6 MG/0.6ML injection Inject 6 mg into the skin every 21 ( twenty-one) days.      . potassium chloride (K-DUR) 10 MEQ tablet Take 2 tablets (20 mEq total) by mouth daily.  14 tablet  0  . PRESCRIPTION MEDICATION every 21 ( twenty-one) days. CHEMOTHERAPY REGIMEN      . prochlorperazine (COMPAZINE) 10 MG tablet Take 1 tablet (10 mg total) by mouth every 6 (six) hours as needed (Nausea or vomiting).  30 tablet  0  . promethazine (PHENERGAN) 25 MG tablet TAKE 1/2 TABLET (12.5 MG TOTAL) BY MOUTH EVERY 6 (SIX) HOURS AS NEEDED FOR NAUSEA.  15 tablet  0  . sucralfate (CARAFATE) 1 GM/10ML suspension Take 10 mLs (1 g total) by mouth 4 (four) times daily.  420 mL  2   No current facility-administered medications for this visit.      REVIEW OF SYSTEMS: A 10 point review of systems was completed and is negative except as noted above.   PHYSICAL EXAMINATION: BP 106/72  Pulse 104  Temp(Src) 97.4  F (36.3 C) (Oral)  Resp 20  Ht 5\' 11"  (1.803 m)  Wt 427 lb 1.6 oz (193.731 kg)  BMI 59.59 kg/m2  General: Patient is a well appearing female in no acute distress HEENT: PERRLA, sclerae anicteric no conjunctival pallor, MMM Neck: supple, no palpable adenopathy Lungs: clear to auscultation bilaterally, no wheezes, rhonchi, or rales Cardiovascular: regular rate rhythm, S1, S2, no murmurs, rubs or gallops Abdomen: Soft, non-tender, non-distended, normoactive bowel sounds, no HSM, limited exam patient morbidly obese, erythema to mid abdomen, no tenderness or warmrth Extremities: warm and well perfused, no clubbing, cyanosis, or edema, strength 5/5 all extremities Skin: no rashes or lesions, right breast with erythema and swelling, left hand with plaque like erythema on fingertips and fingers up to knuckles of all 5 digits.  No warmth, tenderness Neuro: Non-focal ECOG FS:  3 - Symptomatic, >50% confined to bed   LAB RESULTS: Lab Results  Component Value Date   WBC 5.6 05/04/2013   NEUTROABS 3.8 05/04/2013   HGB 10.7* 05/04/2013   HCT 33.5* 05/04/2013   MCV 101.6* 05/04/2013   PLT 195 05/04/2013      Chemistry      Component Value Date/Time   NA 141 05/04/2013 1046   NA 145 04/24/2013 0824   K 3.3* 05/04/2013 1046   K 3.4* 04/24/2013 0824   CL 104 04/24/2013 0824   CL 98 10/07/2012 0923   CO2 28 05/04/2013 1046   CO2 31 04/24/2013 0824   BUN 6.4* 05/04/2013 1046   BUN 6 04/24/2013 0824   CREATININE 0.6 05/04/2013 1046   CREATININE 0.47* 04/24/2013 0824      Component Value Date/Time   CALCIUM 8.1* 05/04/2013 1046   CALCIUM 8.1* 04/24/2013 0824   ALKPHOS 82 05/04/2013 1046   ALKPHOS 179* 03/12/2013 0845   AST 24 05/04/2013 1046   AST 42* 03/12/2013 0845   ALT 21 05/04/2013 1046   ALT 53* 03/12/2013 0845   BILITOT 0.35 05/04/2013 1046  BILITOT 0.5 03/12/2013 0845       No results found for this basename: [id number]    No components found with this basename: [id number]     RADIOGRAPHIC  STUDIES: Ir Vertebroplasty Or Sacroplasty 03/02/2013   CLINICAL DATA:  Patient with severely painful compression fracture at T10 and T11. History of endometrial carcinoma. On chemotherapy.  EXAM: VERTEBROPLASTY FL AT T10 and T11.  CORE BIOPSIES OBTAINED  MEDICATIONS: Versed 4 mg. mg IV, Fentanyl 100  mcg IV.  ANESTHESIA/SEDATION: Total Moderate Sedation Time:  35 min.  FLUOROSCOPY TIME:  8 min 48 seconds. Marland Kitchen  PROCEDURE: Following a full explanation of the procedure along with the potentially associated complications, a witnessed informed consent was obtained.  The patient was placed prone on the fluoroscopic table. Nasal oxygen was administered. Physiologic monitoring was performed throughout the duration of the procedure. The skin overlying the thoracic region was prepped and draped in the usual sterile fashion. The T10 and T11 vertebral bodies were identified and the right pedicle at T10, and the left pedicle at T11 were infiltrated with 0.25% Bupivacaine. This was then followed by the advancement of a 13-gauge Cook needle through both the pedicles into the anterior one-third at both levels. Prior to this, 18 gauge core biopsy needles were used to obtain 4 samples of both the levels using a 20 mL syringe. These were sent for pathologic analysis. A gentle contrast injection demonstrated a trabecular pattern of contrast.  At this time, methylmethacrylate mixture was reconstituted. Under biplane intermittent fluoroscopy, the methylmethacrylate was then injected into the T10 and T11 vertebral body with filling of the vertebral body at T10 and T12.  Mild extravasation was noted into the disk spaces through the inferior endplate fracture clefts . No epidural venous contamination was seen.  The needles were then removed. Hemostasis was achieved at the skin entry site.  There were no acute complications. Patient tolerated the procedure well. The patient was observed for 3 hours and discharged in good condition.   IMPRESSION: Status post vertebral body augmentation for painful compression fracture at T10 and T11 using vertebroplasty technique.  Core biopsies obtained at T10 and T11. Sample sent for pathologic analysis.   Electronically Signed   By: Luanne Bras M.D.   On: 02/26/2013 13:10      ASSESSMENT/PLAN: KEILANA MORLOCK is a 48 y.o. woman:  #1 Recurrent uterine cancer with peritoneal carcinomatosis and ascites:. Patient has now completed approximately 8 cycles of Taxol carboplatinum. She had repeat CT scans performed which showed some response to therapy. I have discussed the case with Dr. Skeet Latch. Who has recommended continuing chemotherapy for now. I have sent a message to my nurse as well as put in a by mouth for patient to be seen back for chemotherapy as well as an office visit on 05/21/2013.  #2 Ascites: Patient has had multiple paracenteses performed. However with chemotherapy we have not had to do a paracentesis for quite some time. At this time we will continue to follow.  #3 Back pain - Compression fractures at T10 and T11 per MRI on 02/05/2013.  Status post kyphoplasty on 02/26/2013.  Patient with repeat MRI on 12/4 demonstrating compression fractures at L1 and L2.  Patient underwent L1 and L2 kyphoplasty on 04/24/12.  Her pain is much improved. She does have followup with interventional radiology.  #4  Right upper extremity DVT: Diagnosed 04/13/13.  She was started on a Heparin gtt and admitted to the hospital where she underwent right port removal  and transitioned to Lovenox BID.  She is tolerating the Lovenox well.  For now we will continue Lovenox.  #5 right breast swelling: Patient has had a mammogram performed which did not reveal any evidence of masses. I do think this may be secondary to the right upper extremity edema and thrombosis. We will continue to monitor this.  #6 rash on left hand: This is significantly improved.      Marcy Panning, MD Medical/Oncology [facility] [phone] (beeper) [phone] (Office)  05/14/2013, 9:48 AM

## 2013-05-14 NOTE — Telephone Encounter (Signed)
, °

## 2013-05-19 ENCOUNTER — Telehealth: Payer: Self-pay | Admitting: *Deleted

## 2013-05-19 ENCOUNTER — Other Ambulatory Visit: Payer: Self-pay | Admitting: Oncology

## 2013-05-19 ENCOUNTER — Ambulatory Visit (HOSPITAL_COMMUNITY)
Admission: RE | Admit: 2013-05-19 | Discharge: 2013-05-19 | Disposition: A | Discharge status: Home / Self Care | Payer: BC Managed Care – PPO | Source: Ambulatory Visit | Attending: Interventional Radiology | Admitting: Interventional Radiology

## 2013-05-19 DIAGNOSIS — IMO0002 Reserved for concepts with insufficient information to code with codable children: Secondary | ICD-10-CM

## 2013-05-19 NOTE — Telephone Encounter (Signed)
Per staff message and POF I have scheduled appts.  JMW  

## 2013-05-20 ENCOUNTER — Other Ambulatory Visit: Payer: Self-pay | Admitting: Emergency Medicine

## 2013-05-20 ENCOUNTER — Telehealth: Payer: Self-pay | Admitting: Oncology

## 2013-05-20 NOTE — Telephone Encounter (Signed)
, °

## 2013-05-21 ENCOUNTER — Telehealth: Payer: Self-pay | Admitting: *Deleted

## 2013-05-21 ENCOUNTER — Encounter (INDEPENDENT_AMBULATORY_CARE_PROVIDER_SITE_OTHER): Payer: Self-pay

## 2013-05-21 ENCOUNTER — Telehealth: Payer: Self-pay | Admitting: Oncology

## 2013-05-21 ENCOUNTER — Other Ambulatory Visit (HOSPITAL_BASED_OUTPATIENT_CLINIC_OR_DEPARTMENT_OTHER): Payer: BC Managed Care – PPO

## 2013-05-21 ENCOUNTER — Ambulatory Visit (HOSPITAL_BASED_OUTPATIENT_CLINIC_OR_DEPARTMENT_OTHER): Payer: BC Managed Care – PPO

## 2013-05-21 ENCOUNTER — Ambulatory Visit (HOSPITAL_BASED_OUTPATIENT_CLINIC_OR_DEPARTMENT_OTHER): Payer: BC Managed Care – PPO | Admitting: Oncology

## 2013-05-21 ENCOUNTER — Encounter: Payer: Self-pay | Admitting: Oncology

## 2013-05-21 VITALS — BP 96/67 | HR 103 | Temp 97.8°F | Resp 20 | Wt >= 6400 oz

## 2013-05-21 DIAGNOSIS — S32020A Wedge compression fracture of second lumbar vertebra, initial encounter for closed fracture: Secondary | ICD-10-CM

## 2013-05-21 DIAGNOSIS — R188 Other ascites: Secondary | ICD-10-CM

## 2013-05-21 DIAGNOSIS — C786 Secondary malignant neoplasm of retroperitoneum and peritoneum: Secondary | ICD-10-CM

## 2013-05-21 DIAGNOSIS — C549 Malignant neoplasm of corpus uteri, unspecified: Secondary | ICD-10-CM

## 2013-05-21 DIAGNOSIS — S32010A Wedge compression fracture of first lumbar vertebra, initial encounter for closed fracture: Secondary | ICD-10-CM

## 2013-05-21 DIAGNOSIS — C541 Malignant neoplasm of endometrium: Secondary | ICD-10-CM

## 2013-05-21 DIAGNOSIS — I82629 Acute embolism and thrombosis of deep veins of unspecified upper extremity: Secondary | ICD-10-CM

## 2013-05-21 DIAGNOSIS — N62 Hypertrophy of breast: Secondary | ICD-10-CM

## 2013-05-21 DIAGNOSIS — R21 Rash and other nonspecific skin eruption: Secondary | ICD-10-CM

## 2013-05-21 DIAGNOSIS — Z5111 Encounter for antineoplastic chemotherapy: Secondary | ICD-10-CM

## 2013-05-21 LAB — COMPREHENSIVE METABOLIC PANEL (CC13)
ALT: 26 U/L (ref 0–55)
ANION GAP: 12 meq/L — AB (ref 3–11)
AST: 32 U/L (ref 5–34)
Albumin: 2.8 g/dL — ABNORMAL LOW (ref 3.5–5.0)
Alkaline Phosphatase: 60 U/L (ref 40–150)
BILIRUBIN TOTAL: 0.32 mg/dL (ref 0.20–1.20)
BUN: 6.5 mg/dL — ABNORMAL LOW (ref 7.0–26.0)
CALCIUM: 9.3 mg/dL (ref 8.4–10.4)
CO2: 28 meq/L (ref 22–29)
Chloride: 102 mEq/L (ref 98–109)
Creatinine: 0.6 mg/dL (ref 0.6–1.1)
GLUCOSE: 107 mg/dL (ref 70–140)
Potassium: 3.6 mEq/L (ref 3.5–5.1)
SODIUM: 142 meq/L (ref 136–145)
TOTAL PROTEIN: 6.3 g/dL — AB (ref 6.4–8.3)

## 2013-05-21 LAB — CBC WITH DIFFERENTIAL/PLATELET
BASO%: 0.4 % (ref 0.0–2.0)
Basophils Absolute: 0 10*3/uL (ref 0.0–0.1)
EOS ABS: 0.2 10*3/uL (ref 0.0–0.5)
EOS%: 4.3 % (ref 0.0–7.0)
HCT: 34.8 % (ref 34.8–46.6)
HGB: 10.5 g/dL — ABNORMAL LOW (ref 11.6–15.9)
LYMPH%: 23.2 % (ref 14.0–49.7)
MCH: 31.6 pg (ref 25.1–34.0)
MCHC: 30.2 g/dL — ABNORMAL LOW (ref 31.5–36.0)
MCV: 104.8 fL — ABNORMAL HIGH (ref 79.5–101.0)
MONO#: 0.4 10*3/uL (ref 0.1–0.9)
MONO%: 9.3 % (ref 0.0–14.0)
NEUT%: 62.8 % (ref 38.4–76.8)
NEUTROS ABS: 2.9 10*3/uL (ref 1.5–6.5)
PLATELETS: 273 10*3/uL (ref 145–400)
RBC: 3.32 10*6/uL — ABNORMAL LOW (ref 3.70–5.45)
RDW: 19.5 % — ABNORMAL HIGH (ref 11.2–14.5)
WBC: 4.6 10*3/uL (ref 3.9–10.3)
lymph#: 1.1 10*3/uL (ref 0.9–3.3)

## 2013-05-21 LAB — CA 125: CA 125: 11 U/mL (ref 0.0–30.2)

## 2013-05-21 MED ORDER — FAMOTIDINE IN NACL 20-0.9 MG/50ML-% IV SOLN
20.0000 mg | Freq: Once | INTRAVENOUS | Status: AC
  Administered 2013-05-21: 20 mg via INTRAVENOUS

## 2013-05-21 MED ORDER — DIPHENHYDRAMINE HCL 50 MG/ML IJ SOLN
50.0000 mg | Freq: Once | INTRAMUSCULAR | Status: AC
  Administered 2013-05-21: 50 mg via INTRAVENOUS

## 2013-05-21 MED ORDER — SODIUM CHLORIDE 0.9 % IV SOLN
150.0000 mg | Freq: Once | INTRAVENOUS | Status: AC
  Administered 2013-05-21: 150 mg via INTRAVENOUS
  Filled 2013-05-21: qty 5

## 2013-05-21 MED ORDER — HEPARIN SOD (PORK) LOCK FLUSH 100 UNIT/ML IV SOLN
250.0000 [IU] | Freq: Once | INTRAVENOUS | Status: AC | PRN
  Administered 2013-05-21: 250 [IU]
  Filled 2013-05-21: qty 5

## 2013-05-21 MED ORDER — SODIUM CHLORIDE 0.9 % IV SOLN
Freq: Once | INTRAVENOUS | Status: AC
  Administered 2013-05-21: 11:00:00 via INTRAVENOUS

## 2013-05-21 MED ORDER — OXYCODONE-ACETAMINOPHEN 5-325 MG PO TABS
2.0000 | ORAL_TABLET | Freq: Once | ORAL | Status: AC
  Administered 2013-05-21: 2 via ORAL

## 2013-05-21 MED ORDER — CARBOPLATIN CHEMO INTRADERMAL TEST DOSE 100MCG/0.02ML
100.0000 ug | Freq: Once | INTRADERMAL | Status: AC
  Administered 2013-05-21: 100 ug via INTRADERMAL
  Filled 2013-05-21: qty 0.01

## 2013-05-21 MED ORDER — PALONOSETRON HCL INJECTION 0.25 MG/5ML
0.2500 mg | Freq: Once | INTRAVENOUS | Status: AC
  Administered 2013-05-21: 0.25 mg via INTRAVENOUS

## 2013-05-21 MED ORDER — DEXAMETHASONE SODIUM PHOSPHATE 20 MG/5ML IJ SOLN
INTRAMUSCULAR | Status: AC
  Filled 2013-05-21: qty 5

## 2013-05-21 MED ORDER — SODIUM CHLORIDE 0.9 % IJ SOLN
10.0000 mL | INTRAMUSCULAR | Status: DC | PRN
  Administered 2013-05-21: 10 mL
  Filled 2013-05-21: qty 10

## 2013-05-21 MED ORDER — DIPHENHYDRAMINE HCL 50 MG/ML IJ SOLN
INTRAMUSCULAR | Status: AC
  Filled 2013-05-21: qty 1

## 2013-05-21 MED ORDER — FAMOTIDINE IN NACL 20-0.9 MG/50ML-% IV SOLN
INTRAVENOUS | Status: AC
  Filled 2013-05-21: qty 50

## 2013-05-21 MED ORDER — OXYCODONE-ACETAMINOPHEN 5-325 MG PO TABS
ORAL_TABLET | ORAL | Status: AC
  Filled 2013-05-21: qty 2

## 2013-05-21 MED ORDER — DEXAMETHASONE SODIUM PHOSPHATE 20 MG/5ML IJ SOLN
12.0000 mg | Freq: Once | INTRAMUSCULAR | Status: AC
  Administered 2013-05-21: 12 mg via INTRAVENOUS

## 2013-05-21 MED ORDER — PALONOSETRON HCL INJECTION 0.25 MG/5ML
INTRAVENOUS | Status: AC
  Filled 2013-05-21: qty 5

## 2013-05-21 MED ORDER — SODIUM CHLORIDE 0.9 % IV SOLN
900.0000 mg | Freq: Once | INTRAVENOUS | Status: AC
  Administered 2013-05-21: 900 mg via INTRAVENOUS
  Filled 2013-05-21: qty 90

## 2013-05-21 MED ORDER — PACLITAXEL CHEMO INJECTION 300 MG/50ML
175.0000 mg/m2 | Freq: Once | INTRAVENOUS | Status: AC
  Administered 2013-05-21: 618 mg via INTRAVENOUS
  Filled 2013-05-21: qty 103

## 2013-05-21 NOTE — Progress Notes (Signed)
[place]  [phone] [phone]  OFFICE PROGRESS NOTE    PATIENT: [name]   DOB: [birth year]  MR#: [id number]  [id number]   [id number], Catherine Main, MD GYN ONC: [name], M.D. SU:  [name], M.D.    DIAGNOSIS: [name] is a 48 y.o. female with a prior history of stage IA uterine cancer diagnosed in 2010.  Now with recurrent disease diagnosed in 08/2012.  PRIOR THERAPY: #1  Patient developed excessive uterine bleeding.  She underwent dilatation and curettage with hysteroscopy. 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 Due to morbid obesity Mirena IUD was placed along with Aygestin. Patient had a dramatic directed weight loss with nutritional support she went from 523 pounds to 359 pounds in 10/2009.  On 12/27/2009 she underwent a robotic-assisted laparoscopic hysterectomy, bilateral salpingo-oophorectomy, and mini laparotomy through the umbilical port with morcellation of uterus within about the delivery of the uterus. The final pathology revealed an endometrial adenocarcinoma grade 2 with invasion limited to 1 mm of the myometrium.  #2  Patient continued to do well until 09/2012 when she developed right upper quadrant pain. It was presumed to be cholelithiasis. 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 Omental biopsies were obtained. The pathology was consistent with metastatic adenocarcinoma with the presumption of recurrent endometrial carcinoma.  She was seen by Dr. Janie Marquez on 10/07/2012.  Dr. Skeet Marquez recommended that [name] undergo chemotherapy consisting of Taxol and Carboplatinum.  Chemotherapy started on 11/04/2012.   #3 Abdominal ascites with multiple paracenteses.  #4 Back pain and subsequent MRI on 02/05/2013 revealed compression fractures of T10 and T11.    #5 Status post kyphoplasty with Dr. Luanne Marquez on 02/26/2013.  Patient states her back pain continues.  #6 Patient hospitalized and underwent more MRI's on 12/4 and L1 and L2 compression fractures were noted, after consultation with Dr. Sherwood Marquez, the patient was referred back to interventional radiology for evaluation for kyphoplasty and this was performed on 04/24/13.    #7 Patient diagnosed with right upper extremity DVT on 04/13/13.  She was initially admitted to the hospital and started on a heparin gtt to remove her port.  She was then transitioned to Enoxaparin BID.     CURRENT THERAPY:   Cycle 9 day 1 o Taxol carboplatinum  INTERVAL HISTORY: Patient is seen in followup today to begin cycle 9 of Taxol carboplatinum. They understand the rationale for this. We discussed risks and benefits of that. Clinically she seems to be doing well and is without any significant problems. She has noticed that she is losing her hair now. She denies any pain. Her pain is really well controlled. Her right upper extremity looks much better. She denies any fevers chills night sweats headaches she has no shortness of breath chest pains or palpitations. She has not had any recurrence of her rashes. Her skin of her hands is very dry and I have recommended that some very good emollients and creams to try to moisturize her body. This could be given to the chemotherapy. She has no bleeding problems. Her bowels and bladder is working well. Remainder of the 10 point systems is negative.  PAST MEDICAL HISTORY: Past Medical History  Diagnosis Date  . Obesity   . DVT (deep venous thrombosis) 2009    RLE, tx for ~6 months  . PONV (postoperative nausea and vomiting)   .  Swelling     BOTH LEGS  . Rash     LOWER ABD  . Gallstones   . Ascites   . Uterine cancer     Dr. Humphrey Marquez, chemotherapy  . Family history of anesthesia complication     MOTHER IS DIFFICULT TO WAKE  . Gallstones   . Cellulitis of leg, right      PAST SURGICAL HISTORY: Past Surgical History  Procedure Laterality Date  . Ankle surgery  1990  . Wisdom tooth extraction    . Tonsillectomy and adenoidectomy    . Abdominal hysterectomy  12/2009    [facility], [facility]  . Laparoscopy N/A 10/02/2012    Procedure: LAPAROSCOPY DIAGNOSTIC, perocentisis, omental biopsy;  Surgeon: Catherine Burn. Tsuei, MD;  Location: [facility];  Service: General;  Laterality: N/A;  removed a total of 15,000 of acities  . Paracentesis    . Kyphoplasty  02/26/2013    FAMILY HISTORY: Family History  Problem Relation Age of Onset  . Heart disease Father   . Diabetes Brother   . Heart disease Mother   . Cirrhosis Father   . Diabetes Father   . Thyroid disease Mother   . Diabetes Mother   . Kidney disease Brother     from diabetes  . Breast cancer Maternal Aunt   . Breast cancer Maternal Aunt   . Melanoma Maternal Aunt   . Melanoma Maternal Aunt   . Other Maternal Aunt     lupus anticardiolipin ab syndrome    SOCIAL HISTORY: History  Substance Use Topics  . Smoking status: Never Smoker   . Smokeless tobacco: Never Used  . Alcohol Use: Yes     Comment: social, Occassionally    ALLERGIES: No Known Allergies   MEDICATIONS:  Current Outpatient Prescriptions  Medication Sig Dispense Refill  . Ascorbic Acid (VITAMIN C) 1000 MG tablet Take 1,000 mg by mouth daily.      Marland Kitchen b complex vitamins capsule Take 1 capsule by mouth daily.      . Cyanocobalamin (VITAMIN B-12 PO) Take 2,500 mg by mouth daily.       . cyclobenzaprine (FLEXERIL) 5 MG tablet TAKE 1 TABLET BY MOUTH 3 TIMES A DAY AS NEEDED FOR MUSCLE SPASMS  60 tablet  1  . enoxaparin (LOVENOX) 150 MG/ML injection Inject 1.27 mLs (190 mg total) into the skin every 12 (twelve) hours.  30 Syringe  0  . ferrous sulfate 325 (65 FE) MG tablet Take 1 tablet (325 mg total) by mouth 3 (three) times daily with meals.  90 tablet  2  . folic acid (FOLVITE) 073 MCG tablet Take 400 mcg by mouth daily.        . furosemide  (LASIX) 20 MG tablet Take 1 tablet (20 mg total) by mouth every other day.  30 tablet  0  . magnesium gluconate (MAGONATE) 500 MG tablet Take 500 mg by mouth daily.      . miconazole nitrate (MICATIN) POWD Apply 1 application topically 4 (four) times daily.  10 g  2  . Multiple Vitamins-Minerals (MULTIVITAMIN WITH MINERALS) tablet Take 1 tablet by mouth daily.       Marland Kitchen nystatin ointment (MYCOSTATIN) Apply 1 application topically 2 (two) times daily.      . ondansetron (ZOFRAN) 8 MG tablet Take 1 tablet (8 mg total) by mouth 2 (two) times daily. Start day after chemo then two times daily as needed  20 tablet  1  . oxyCODONE-acetaminophen (PERCOCET) 10-325 MG per tablet  Take 1 tablet by mouth every 4 (four) hours as needed for pain.  90 tablet  0  . pantoprazole (PROTONIX) 40 MG tablet Take 40 mg by mouth daily.      . pegfilgrastim (NEULASTA) 6 MG/0.6ML injection Inject 6 mg into the skin every 21 ( twenty-one) days.      . potassium chloride (K-DUR) 10 MEQ tablet Take 2 tablets (20 mEq total) by mouth daily.  14 tablet  0  . prochlorperazine (COMPAZINE) 10 MG tablet Take 1 tablet (10 mg total) by mouth every 6 (six) hours as needed (Nausea or vomiting).  30 tablet  1  . sucralfate (CARAFATE) 1 GM/10ML suspension Take 10 mLs (1 g total) by mouth 4 (four) times daily.  420 mL  2  . loratadine (CLARITIN) 10 MG tablet Take 10 mg by mouth daily.      Marland Kitchen PACLitaxel (TAXOL IV) Inject 1 each into the vein every 21 ( twenty-one) days.      Marland Kitchen PRESCRIPTION MEDICATION every 21 ( twenty-one) days. CHEMOTHERAPY REGIMEN      . promethazine (PHENERGAN) 25 MG tablet TAKE 1/2 TABLET BY MOUTH EVERY 6 HOURS PRN FOR NAUSEA.  15 tablet  1   No current facility-administered medications for this visit.      REVIEW OF SYSTEMS: A 10 point review of systems was completed and is negative except as noted above.   PHYSICAL EXAMINATION: BP 96/67  Pulse 103  Temp(Src) 97.8 F (36.6 C)  Resp 20  Wt 362 lb 6.4 oz (164.384  kg)  General: Patient is a well appearing female in no acute distress HEENT: PERRLA, sclerae anicteric no conjunctival pallor, MMM Neck: supple, no palpable adenopathy Lungs: clear to auscultation bilaterally, no wheezes, rhonchi, or rales Cardiovascular: regular rate rhythm, S1, S2, no murmurs, rubs or gallops Abdomen: Soft, non-tender, non-distended, normoactive bowel sounds, no HSM, limited exam patient morbidly obese, erythema to mid abdomen, no tenderness or warmrth Extremities: warm and well perfused, no clubbing, cyanosis, or edema, strength 5/5 all extremities Skin: no rashes or lesions, right breast with erythema and swelling, left hand with plaque like erythema on fingertips and fingers up to knuckles of all 5 digits.  No warmth, tenderness Neuro: Non-focal ECOG FS:  3 - Symptomatic, >50% confined to bed   LAB RESULTS: Lab Results  Component Value Date   WBC 4.6 05/21/2013   NEUTROABS 2.9 05/21/2013   HGB 10.5* 05/21/2013   HCT 34.8 05/21/2013   MCV 104.8* 05/21/2013   PLT 273 05/21/2013      Chemistry      Component Value Date/Time   NA 141 05/04/2013 1046   NA 145 04/24/2013 0824   K 3.3* 05/04/2013 1046   K 3.4* 04/24/2013 0824   CL 104 04/24/2013 0824   CL 98 10/07/2012 0923   CO2 28 05/04/2013 1046   CO2 31 04/24/2013 0824   BUN 6.4* 05/04/2013 1046   BUN 6 04/24/2013 0824   CREATININE 0.6 05/04/2013 1046   CREATININE 0.47* 04/24/2013 0824      Component Value Date/Time   CALCIUM 8.1* 05/04/2013 1046   CALCIUM 8.1* 04/24/2013 0824   ALKPHOS 82 05/04/2013 1046   ALKPHOS 179* 03/12/2013 0845   AST 24 05/04/2013 1046   AST 42* 03/12/2013 0845   ALT 21 05/04/2013 1046   ALT 53* 03/12/2013 0845   BILITOT 0.35 05/04/2013 1046   BILITOT 0.5 03/12/2013 0845       No results found for this basename: [id number]  No components found with this basename: [id number]     RADIOGRAPHIC STUDIES: Ir Vertebroplasty Or Sacroplasty 03/02/2013   CLINICAL DATA:  Patient with severely painful  compression fracture at T10 and T11. History of endometrial carcinoma. On chemotherapy.  EXAM: VERTEBROPLASTY FL AT T10 and T11.  CORE BIOPSIES OBTAINED  MEDICATIONS: Versed 4 mg. mg IV, Fentanyl 100  mcg IV.  ANESTHESIA/SEDATION: Total Moderate Sedation Time:  35 min.  FLUOROSCOPY TIME:  8 min 48 seconds. Marland Kitchen  PROCEDURE: Following a full explanation of the procedure along with the potentially associated complications, a witnessed informed consent was obtained.  The patient was placed prone on the fluoroscopic table. Nasal oxygen was administered. Physiologic monitoring was performed throughout the duration of the procedure. The skin overlying the thoracic region was prepped and draped in the usual sterile fashion. The T10 and T11 vertebral bodies were identified and the right pedicle at T10, and the left pedicle at T11 were infiltrated with 0.25% Bupivacaine. This was then followed by the advancement of a 13-gauge Cook needle through both the pedicles into the anterior one-third at both levels. Prior to this, 18 gauge core biopsy needles were used to obtain 4 samples of both the levels using a 20 mL syringe. These were sent for pathologic analysis. A gentle contrast injection demonstrated a trabecular pattern of contrast.  At this time, methylmethacrylate mixture was reconstituted. Under biplane intermittent fluoroscopy, the methylmethacrylate was then injected into the T10 and T11 vertebral body with filling of the vertebral body at T10 and T12.  Mild extravasation was noted into the disk spaces through the inferior endplate fracture clefts . No epidural venous contamination was seen.  The needles were then removed. Hemostasis was achieved at the skin entry site.  There were no acute complications. Patient tolerated the procedure well. The patient was observed for 3 hours and discharged in good condition.  IMPRESSION: Status post vertebral body augmentation for painful compression fracture at T10 and T11 using  vertebroplasty technique.  Core biopsies obtained at T10 and T11. Sample sent for pathologic analysis.   Electronically Signed   By: [name] M.D.   On: 02/26/2013 13:10      ASSESSMENT/PLAN: [name] is a 48 y.o. woman:  #1 Recurrent uterine cancer with peritoneal carcinomatosis and ascites:. Patient will proceed with cycle 9 day 1 of Taxol carboplatinum. Risks benefits and side effects of treatment were discussed. She understands that she may continue this for a few more cycles. She understands that she may get another test dose for the carboplatinum. She knows that she is at risk for developing allergic reactions. She consents to proceeding with her therapy.  #2 Ascites: Patient has had multiple paracenteses performed. However with chemotherapy we have not had to do a paracentesis for quite some time. At this time we will continue to follow.  #3 Back pain - Compression fractures at T10 and T11 per MRI on 02/05/2013.  Status post kyphoplasty on 02/26/2013.  Patient with repeat MRI on 12/4 demonstrating compression fractures at L1 and L2.  Patient underwent L1 and L2 kyphoplasty on 04/24/12.  Her pain is much improved. She does have followup with interventional radiology.  #4  Right upper extremity DVT: Diagnosed 04/13/13.  She was started on a Heparin gtt and admitted to the hospital where she underwent right port removal and transitioned to Lovenox BID.  She is tolerating the Lovenox well.  For now we will continue Lovenox.  #5 right breast swelling: Patient has had  a mammogram performed which did not reveal any evidence of masses. I do think this may be secondary to the right upper extremity edema and thrombosis. We will continue to monitor this.  #6 rash on left hand: This is significantly improved.      Marcy Panning, MD Medical/Oncology [facility] [phone] (beeper) [phone] (Office)  05/21/2013, 9:38 AM

## 2013-05-21 NOTE — Patient Instructions (Signed)
[facility] Discharge Instructions for Patients Receiving Chemotherapy  Today you received the following chemotherapy agents: Taxol and Carboplatin.  To help prevent nausea and vomiting after your treatment, we encourage you to take your nausea medication, Compazine. Take it every six hours as needed for the first 72 hours after chemo. For chemo on or after 05/24/13, take Zofran.    If you develop nausea and vomiting that is not controlled by your nausea medication, call the clinic.   BELOW ARE SYMPTOMS THAT SHOULD BE REPORTED IMMEDIATELY:  *FEVER GREATER THAN 100.5 F  *CHILLS WITH OR WITHOUT FEVER  NAUSEA AND VOMITING THAT IS NOT CONTROLLED WITH YOUR NAUSEA MEDICATION  *UNUSUAL SHORTNESS OF BREATH  *UNUSUAL BRUISING OR BLEEDING  TENDERNESS IN MOUTH AND THROAT WITH OR WITHOUT PRESENCE OF ULCERS  *URINARY PROBLEMS  *BOWEL PROBLEMS  UNUSUAL RASH Items with * indicate a potential emergency and should be followed up as soon as possible.  Feel free to call the clinic should you have any questions or concerns. The clinic phone number is [phone]. It has been a pleasure working with you today! --Lavella Lemons

## 2013-05-21 NOTE — Telephone Encounter (Signed)
Per staff message and POF I have scheduled appts.  JMW  

## 2013-05-22 ENCOUNTER — Encounter (INDEPENDENT_AMBULATORY_CARE_PROVIDER_SITE_OTHER): Payer: Self-pay

## 2013-05-22 ENCOUNTER — Ambulatory Visit (HOSPITAL_BASED_OUTPATIENT_CLINIC_OR_DEPARTMENT_OTHER): Payer: BC Managed Care – PPO

## 2013-05-22 VITALS — BP 109/70 | HR 90 | Temp 97.6°F

## 2013-05-22 DIAGNOSIS — C786 Secondary malignant neoplasm of retroperitoneum and peritoneum: Secondary | ICD-10-CM

## 2013-05-22 DIAGNOSIS — C541 Malignant neoplasm of endometrium: Secondary | ICD-10-CM

## 2013-05-22 DIAGNOSIS — Z5189 Encounter for other specified aftercare: Secondary | ICD-10-CM

## 2013-05-22 DIAGNOSIS — C549 Malignant neoplasm of corpus uteri, unspecified: Secondary | ICD-10-CM

## 2013-05-22 MED ORDER — PEGFILGRASTIM INJECTION 6 MG/0.6ML
6.0000 mg | Freq: Once | SUBCUTANEOUS | Status: AC
  Administered 2013-05-22: 6 mg via SUBCUTANEOUS
  Filled 2013-05-22: qty 0.6

## 2013-05-28 ENCOUNTER — Other Ambulatory Visit: Payer: Self-pay | Admitting: Adult Health

## 2013-05-28 ENCOUNTER — Ambulatory Visit (HOSPITAL_BASED_OUTPATIENT_CLINIC_OR_DEPARTMENT_OTHER): Payer: BC Managed Care – PPO | Admitting: Hematology and Oncology

## 2013-05-28 ENCOUNTER — Other Ambulatory Visit (HOSPITAL_BASED_OUTPATIENT_CLINIC_OR_DEPARTMENT_OTHER): Payer: BC Managed Care – PPO

## 2013-05-28 VITALS — BP 106/73 | HR 103 | Temp 97.9°F | Resp 20 | Ht 71.0 in | Wt >= 6400 oz

## 2013-05-28 DIAGNOSIS — C786 Secondary malignant neoplasm of retroperitoneum and peritoneum: Secondary | ICD-10-CM

## 2013-05-28 DIAGNOSIS — C541 Malignant neoplasm of endometrium: Secondary | ICD-10-CM

## 2013-05-28 DIAGNOSIS — N390 Urinary tract infection, site not specified: Secondary | ICD-10-CM

## 2013-05-28 DIAGNOSIS — R188 Other ascites: Secondary | ICD-10-CM

## 2013-05-28 DIAGNOSIS — R399 Unspecified symptoms and signs involving the genitourinary system: Secondary | ICD-10-CM

## 2013-05-28 DIAGNOSIS — I82409 Acute embolism and thrombosis of unspecified deep veins of unspecified lower extremity: Secondary | ICD-10-CM

## 2013-05-28 DIAGNOSIS — M549 Dorsalgia, unspecified: Secondary | ICD-10-CM

## 2013-05-28 DIAGNOSIS — N63 Unspecified lump in unspecified breast: Secondary | ICD-10-CM

## 2013-05-28 DIAGNOSIS — C549 Malignant neoplasm of corpus uteri, unspecified: Secondary | ICD-10-CM

## 2013-05-28 LAB — URINALYSIS, MICROSCOPIC - CHCC
Blood: NEGATIVE
Glucose: NEGATIVE mg/dL
Ketones: NEGATIVE mg/dL
NITRITE: NEGATIVE
PH: 6 (ref 4.6–8.0)
PROTEIN: 30 mg/dL
RBC / HPF: NEGATIVE (ref 0–2)
Specific Gravity, Urine: 1.025 (ref 1.003–1.035)
Urobilinogen, UR: 0.2 mg/dL (ref 0.2–1)

## 2013-05-28 LAB — CBC WITH DIFFERENTIAL/PLATELET
BASO%: 0.4 % (ref 0.0–2.0)
BASOS ABS: 0 10*3/uL (ref 0.0–0.1)
EOS%: 4.5 % (ref 0.0–7.0)
Eosinophils Absolute: 0.1 10*3/uL (ref 0.0–0.5)
HCT: 34.8 % (ref 34.8–46.6)
HGB: 11 g/dL — ABNORMAL LOW (ref 11.6–15.9)
LYMPH%: 36.6 % (ref 14.0–49.7)
MCH: 31.5 pg (ref 25.1–34.0)
MCHC: 31.6 g/dL (ref 31.5–36.0)
MCV: 99.7 fL (ref 79.5–101.0)
MONO#: 0.3 10*3/uL (ref 0.1–0.9)
MONO%: 10.3 % (ref 0.0–14.0)
NEUT#: 1.2 10*3/uL — ABNORMAL LOW (ref 1.5–6.5)
NEUT%: 48.2 % (ref 38.4–76.8)
Platelets: 132 10*3/uL — ABNORMAL LOW (ref 145–400)
RBC: 3.49 10*6/uL — AB (ref 3.70–5.45)
RDW: 17.8 % — AB (ref 11.2–14.5)
WBC: 2.4 10*3/uL — ABNORMAL LOW (ref 3.9–10.3)
lymph#: 0.9 10*3/uL (ref 0.9–3.3)

## 2013-05-28 LAB — COMPREHENSIVE METABOLIC PANEL (CC13)
ALBUMIN: 3.5 g/dL (ref 3.5–5.0)
ALT: 41 U/L (ref 0–55)
AST: 47 U/L — ABNORMAL HIGH (ref 5–34)
Alkaline Phosphatase: 87 U/L (ref 40–150)
Anion Gap: 13 mEq/L — ABNORMAL HIGH (ref 3–11)
BUN: 4.1 mg/dL — ABNORMAL LOW (ref 7.0–26.0)
CALCIUM: 9.8 mg/dL (ref 8.4–10.4)
CHLORIDE: 97 meq/L — AB (ref 98–109)
CO2: 30 mEq/L — ABNORMAL HIGH (ref 22–29)
Creatinine: 0.6 mg/dL (ref 0.6–1.1)
Glucose: 129 mg/dl (ref 70–140)
Potassium: 3.4 mEq/L — ABNORMAL LOW (ref 3.5–5.1)
SODIUM: 140 meq/L (ref 136–145)
TOTAL PROTEIN: 7.2 g/dL (ref 6.4–8.3)
Total Bilirubin: 0.46 mg/dL (ref 0.20–1.20)

## 2013-05-28 MED ORDER — CIPROFLOXACIN HCL 250 MG PO TABS: 250.0000 mg | ORAL_TABLET | Freq: Two times a day (BID) | ORAL | Status: AC

## 2013-05-28 MED ORDER — ONDANSETRON 8 MG PO TBDP: 8.0000 mg | ORAL_TABLET | Freq: Three times a day (TID) | ORAL | Status: DC | PRN

## 2013-05-28 NOTE — Progress Notes (Signed)
Pt express concerns about urinating roughly every hour since this past Sunday.  Having trouble staying sleep at night. Dr Earnest Conroy made aware of pt concerns.

## 2013-05-28 NOTE — Progress Notes (Signed)
[facility]  [phone] [phone]  OFFICE PROGRESS NOTE    PATIENT: [name]   DOB: [birth year]  MR#: [id number]  YQI#:[id number]   LO:VFIEPP, Jeannett Senior, MD GYN ONC: Laurette Schimke, M.D. SU:  Manus Rudd, M.D.    DIAGNOSIS: [name] is a 48 y.o. female with a prior history of stage IA uterine cancer diagnosed in 2010.  Now with recurrent disease diagnosed in 08/2012.  CHIEF COMPLAINT: Urinary symptoms and followup visit   PRIOR THERAPY: #1  Patient developed excessive uterine bleeding.  She underwent dilatation and curettage with hysteroscopy. 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 Due to morbid obesity Mirena IUD was placed along with Aygestin. Patient had a dramatic directed weight loss with nutritional support she went from 523 pounds to 359 pounds in 10/2009.  On 12/27/2009 she underwent a robotic-assisted laparoscopic hysterectomy, bilateral salpingo-oophorectomy, and mini laparotomy through the umbilical port with morcellation of uterus within about the delivery of the uterus. The final pathology revealed an endometrial adenocarcinoma grade 2 with invasion limited to 1 mm of the myometrium.  #2  Patient continued to do well until 09/2012 when she developed right upper quadrant pain. It was presumed to be cholelithiasis. 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 Omental biopsies were obtained. The pathology was consistent with metastatic adenocarcinoma with the presumption of recurrent endometrial carcinoma.  She was seen by Dr. Laurette Schimke on 10/07/2012.  Dr. Nelly Rout recommended that [name] undergo chemotherapy consisting of Taxol and Carboplatinum.  Chemotherapy started on 11/04/2012.   #3 Abdominal ascites with multiple paracenteses.  #4 Back pain and subsequent MRI on  02/05/2013 revealed compression fractures of T10 and T11.   #5 Status post kyphoplasty with Dr. Julieanne Cotton on 02/26/2013.  Patient states her back pain continues.  #6 Patient hospitalized and underwent more MRI's on 12/4 and L1 and L2 compression fractures were noted, after consultation with Dr. Newell Coral, the patient was referred back to interventional radiology for evaluation for kyphoplasty and this was performed on 04/24/13.    #7 Patient diagnosed with right upper extremity DVT on 04/13/13.  She was initially admitted to the hospital and started on a heparin gtt to remove her port.  She was then transitioned to Enoxaparin BID.     CURRENT THERAPY:   S/P Cycle 9 chemo with Taxol and carboplatinum  INTERVAL HISTORY: [name] is here for followup visit after completion of cycle 9 chemotherapy with Taxol and carboplatinum. She complains of  fatigue. She is accompanied by her mother and aunt. Her mother says that she's not eating that well and not drinking more fluids. She c/o frequent urination for the past one week. Her mother stopped giving Lasix for the past 2 days.  She does not complain of any burning urination. She did noticed decrease in the bilateral lower extremity swelling and also abdominal distension. She does not complain of any fever, chills denies any shortness of breath, chest pains, palpitations, headaches, blurred vision, blood in the stool blood in the urine.   Remainder of the 10 point systems is negative.  PAST MEDICAL HISTORY: Past Medical History  Diagnosis Date  . Obesity   . DVT (deep venous thrombosis) 2009    RLE, tx for ~6 months  . PONV (postoperative nausea and vomiting)   . Swelling     BOTH LEGS  . Rash  LOWER ABD  . Gallstones   . Ascites   . Uterine cancer     Dr. Humphrey Rolls, chemotherapy  . Family history of anesthesia complication     MOTHER IS DIFFICULT TO WAKE  . Gallstones   . Cellulitis of leg, right     PAST SURGICAL HISTORY: Past  Surgical History  Procedure Laterality Date  . Ankle surgery  1990  . Wisdom tooth extraction    . Tonsillectomy and adenoidectomy    . Abdominal hysterectomy  12/2009    [facility], [facility]  . Laparoscopy N/A 10/02/2012    Procedure: LAPAROSCOPY DIAGNOSTIC, perocentisis, omental biopsy;  Surgeon: Imogene Burn. Tsuei, MD;  Location: [facility];  Service: General;  Laterality: N/A;  removed a total of 15,000 of acities  . Paracentesis    . Kyphoplasty  02/26/2013    FAMILY HISTORY: Family History  Problem Relation Age of Onset  . Heart disease Father   . Diabetes Brother   . Heart disease Mother   . Cirrhosis Father   . Diabetes Father   . Thyroid disease Mother   . Diabetes Mother   . Kidney disease Brother     from diabetes  . Breast cancer Maternal Aunt   . Breast cancer Maternal Aunt   . Melanoma Maternal Aunt   . Melanoma Maternal Aunt   . Other Maternal Aunt     lupus anticardiolipin ab syndrome    SOCIAL HISTORY: History  Substance Use Topics  . Smoking status: Never Smoker   . Smokeless tobacco: Never Used  . Alcohol Use: Yes     Comment: social, Occassionally    ALLERGIES: No Known Allergies   MEDICATIONS:  Current Outpatient Prescriptions  Medication Sig Dispense Refill  . Ascorbic Acid (VITAMIN C) 1000 MG tablet Take 1,000 mg by mouth daily.      Marland Kitchen b complex vitamins capsule Take 1 capsule by mouth daily.      . Cyanocobalamin (VITAMIN B-12 PO) Take 2,500 mg by mouth daily.       . cyclobenzaprine (FLEXERIL) 5 MG tablet TAKE 1 TABLET BY MOUTH 3 TIMES A DAY AS NEEDED FOR MUSCLE SPASMS  60 tablet  1  . enoxaparin (LOVENOX) 150 MG/ML injection Inject 1.27 mLs (190 mg total) into the skin every 12 (twelve) hours.  30 Syringe  0  . ferrous sulfate 325 (65 FE) MG tablet Take 1 tablet (325 mg total) by mouth 3 (three) times daily with meals.  90 tablet  2  . folic acid (FOLVITE) 237 MCG tablet Take 400 mcg by mouth daily.        . furosemide (LASIX) 20 MG tablet Take 1 tablet  (20 mg total) by mouth every other day.  30 tablet  0  . loratadine (CLARITIN) 10 MG tablet Take 10 mg by mouth daily.      . magnesium gluconate (MAGONATE) 500 MG tablet Take 500 mg by mouth daily.      . miconazole nitrate (MICATIN) POWD Apply 1 application topically 4 (four) times daily.  10 g  2  . Multiple Vitamins-Minerals (MULTIVITAMIN WITH MINERALS) tablet Take 1 tablet by mouth daily.       Marland Kitchen nystatin ointment (MYCOSTATIN) Apply 1 application topically 2 (two) times daily.      . ondansetron (ZOFRAN) 8 MG tablet Take 1 tablet (8 mg total) by mouth 2 (two) times daily. Start day after chemo then two times daily as needed  20 tablet  1  . oxyCODONE-acetaminophen (PERCOCET) 10-325  MG per tablet Take 1 tablet by mouth every 4 (four) hours as needed for pain.  90 tablet  0  . PACLitaxel (TAXOL IV) Inject 1 each into the vein every 21 ( twenty-one) days.      . pantoprazole (PROTONIX) 40 MG tablet Take 40 mg by mouth daily.      . pegfilgrastim (NEULASTA) 6 MG/0.6ML injection Inject 6 mg into the skin every 21 ( twenty-one) days.      . potassium chloride (K-DUR) 10 MEQ tablet Take 2 tablets (20 mEq total) by mouth daily.  14 tablet  0  . PRESCRIPTION MEDICATION every 21 ( twenty-one) days. CHEMOTHERAPY REGIMEN      . prochlorperazine (COMPAZINE) 10 MG tablet Take 1 tablet (10 mg total) by mouth every 6 (six) hours as needed (Nausea or vomiting).  30 tablet  1  . promethazine (PHENERGAN) 25 MG tablet TAKE 1/2 TABLET BY MOUTH EVERY 6 HOURS PRN FOR NAUSEA.  15 tablet  1  . sucralfate (CARAFATE) 1 GM/10ML suspension Take 10 mLs (1 g total) by mouth 4 (four) times daily.  420 mL  2  . ciprofloxacin (CIPRO) 250 MG tablet Take 1 tablet (250 mg total) by mouth 2 (two) times daily.  6 tablet  0  . ondansetron (ZOFRAN ODT) 8 MG disintegrating tablet Take 1 tablet (8 mg total) by mouth every 8 (eight) hours as needed for nausea or vomiting.  20 tablet  0   No current facility-administered medications for  this visit.      REVIEW OF SYSTEMS: A 10 point review of systems was completed and is negative except as noted above.   PHYSICAL EXAMINATION: BP 106/73  Pulse 103  Temp(Src) 97.9 F (36.6 C) (Oral)  Resp 20  Ht 5\' 11"  (1.803 m)  Wt 406 lb 6.4 oz (184.342 kg)  BMI 56.71 kg/m2  General: Patient is a well appearing female in no acute distress HEENT: PERRLA, sclerae anicteric no conjunctival pallor, MMM Neck: supple, no palpable adenopathy Lungs: clear to auscultation bilaterally, no wheezes, rhonchi, or rales Cardiovascular: regular rate rhythm, S1, S2, no murmurs, rubs or gallops Abdomen: Soft, non-tender, mildly distended,  normoactive bowel sounds,  morbidly obese Extremities: warm and well perfused, no clubbing, cyanosis, bilateral lower extremity edema is improving  Skin: no rashes or lesions,  right breast with erythema and swelling, left hand with plaque like erythema on fingertips and fingers up to knuckles of all 5 digits.  No warmth, tenderness Neuro: Non-focal  ECOG FS:  3 - Symptomatic, >50% confined to bed   LAB RESULTS: Lab Results  Component Value Date   WBC 2.4* 05/28/2013   NEUTROABS 1.2* 05/28/2013   HGB 11.0* 05/28/2013   HCT 34.8 05/28/2013   MCV 99.7 05/28/2013   PLT 132* 05/28/2013      Chemistry      Component Value Date/Time   NA 140 05/28/2013 0928   NA 145 04/24/2013 0824   K 3.4* 05/28/2013 0928   K 3.4* 04/24/2013 0824   CL 104 04/24/2013 0824   CL 98 10/07/2012 0923   CO2 30* 05/28/2013 0928   CO2 31 04/24/2013 0824   BUN 4.1* 05/28/2013 0928   BUN 6 04/24/2013 0824   CREATININE 0.6 05/28/2013 0928   CREATININE 0.47* 04/24/2013 0824      Component Value Date/Time   CALCIUM 9.8 05/28/2013 0928   CALCIUM 8.1* 04/24/2013 0824   ALKPHOS 87 05/28/2013 0928   ALKPHOS 179* 03/12/2013 0845   AST 47*  05/28/2013 0928   AST 42* 03/12/2013 0845   ALT 41 05/28/2013 0928   ALT 53* 03/12/2013 0845   BILITOT 0.46 05/28/2013 0928   BILITOT 0.5 03/12/2013 0845       No results found  for this basename: [id number]    No components found with this basename: [id number]     RADIOGRAPHIC STUDIES: Ir Vertebroplasty Or Sacroplasty 03/02/2013   CLINICAL DATA:  Patient with severely painful compression fracture at T10 and T11. History of endometrial carcinoma. On chemotherapy.  EXAM: VERTEBROPLASTY FL AT T10 and T11.  CORE BIOPSIES OBTAINED  MEDICATIONS: Versed 4 mg. mg IV, Fentanyl 100  mcg IV.  ANESTHESIA/SEDATION: Total Moderate Sedation Time:  35 min.  FLUOROSCOPY TIME:  8 min 48 seconds. Marland Kitchen  PROCEDURE: Following a full explanation of the procedure along with the potentially associated complications, a witnessed informed consent was obtained.  The patient was placed prone on the fluoroscopic table. Nasal oxygen was administered. Physiologic monitoring was performed throughout the duration of the procedure. The skin overlying the thoracic region was prepped and draped in the usual sterile fashion. The T10 and T11 vertebral bodies were identified and the right pedicle at T10, and the left pedicle at T11 were infiltrated with 0.25% Bupivacaine. This was then followed by the advancement of a 13-gauge Cook needle through both the pedicles into the anterior one-third at both levels. Prior to this, 18 gauge core biopsy needles were used to obtain 4 samples of both the levels using a 20 mL syringe. These were sent for pathologic analysis. A gentle contrast injection demonstrated a trabecular pattern of contrast.  At this time, methylmethacrylate mixture was reconstituted. Under biplane intermittent fluoroscopy, the methylmethacrylate was then injected into the T10 and T11 vertebral body with filling of the vertebral body at T10 and T12.  Mild extravasation was noted into the disk spaces through the inferior endplate fracture clefts . No epidural venous contamination was seen.  The needles were then removed. Hemostasis was achieved at the skin entry site.  There were no acute complications. Patient  tolerated the procedure well. The patient was observed for 3 hours and discharged in good condition.  IMPRESSION: Status post vertebral body augmentation for painful compression fracture at T10 and T11 using vertebroplasty technique.  Core biopsies obtained at T10 and T11. Sample sent for pathologic analysis.   Electronically Signed   By: Luanne Bras M.D.   On: 02/26/2013 13:10      ASSESSMENT/PLAN: [name] is a 48 y.o. woman:  #1 Recurrent uterine cancer with peritoneal carcinomatosis and ascites: Patient will proceed with cycle 10 of chemotherapy with Taxol carboplatinum as scheduled on 06/12/2013. Clinically she is responding well to the chemotherapy requiring less frequent therapeutic paracentesis.     #2 Ascites:  Clinically she is responding well to the chemotherapy requiring less frequent therapeutic paracentesis. Continue to monitor her condition.     #3 Back pain - Compression fractures at T10 and T11 per MRI on 02/05/2013.  Status post kyphoplasty on 02/26/2013.  Patient with repeat MRI on 12/4 demonstrating compression fractures at L1 and L2.  Patient underwent L1 and L2 kyphoplasty on 04/24/12.   Her pain is well controlled after kyphoplasty. she was also recently seen by interventional radiology   #4  Right upper extremity DVT: Diagnosed 04/13/13.  She was started on a Heparin gtt and admitted to the hospital where she underwent right port removal and transitioned to Lovenox BID.  She is tolerating the Lovenox well.  continue Lovenox as scheduled   #5 right breast swelling-improving: Patient has had a mammogram performed which did not reveal any evidence of masses. We will continue to monitor this.  #6 UTI: Her urine analysis revealed moderate bacteria. Will initiate Cipro 250 mg by mouth twice a day for 3 days . Repeat UA and microscopy with next visit  Next followup visit -Labs, chemotherapy and physician visit as scheduled on 06/12/2013  Wilmon Arms,  M.D. Medical/Oncology [place] 05/28/2013, 4:25 PM

## 2013-06-02 ENCOUNTER — Telehealth: Payer: Self-pay

## 2013-06-02 NOTE — Telephone Encounter (Signed)
Patients mother stated [name] is having frequency with very little output. Denies fever or burning.  Stated that patient finished her 3 day course of cipro last week.  Dr Humphrey Rolls wanted patient to come in today for urine sample and to see Dr Earnest Conroy  but patient's mother stated that Hadeel's grandfather passed yesterday and the family is dealing with all of the arrangements.  Chasity also refused to come in today.  She is going to try drinking more water today and stop drinking tea and ginger ale. Jermiyah's mother said she would call back tomorrow if symptoms are no better and bring her in to be seen.

## 2013-06-11 ENCOUNTER — Ambulatory Visit: Payer: BC Managed Care – PPO

## 2013-06-12 ENCOUNTER — Other Ambulatory Visit (HOSPITAL_BASED_OUTPATIENT_CLINIC_OR_DEPARTMENT_OTHER): Payer: BC Managed Care – PPO

## 2013-06-12 ENCOUNTER — Ambulatory Visit (HOSPITAL_BASED_OUTPATIENT_CLINIC_OR_DEPARTMENT_OTHER): Payer: BC Managed Care – PPO | Admitting: Oncology

## 2013-06-12 ENCOUNTER — Telehealth: Payer: Self-pay | Admitting: Oncology

## 2013-06-12 ENCOUNTER — Ambulatory Visit (HOSPITAL_BASED_OUTPATIENT_CLINIC_OR_DEPARTMENT_OTHER): Payer: BC Managed Care – PPO

## 2013-06-12 VITALS — BP 113/75 | HR 105 | Temp 97.9°F | Resp 20 | Ht 71.0 in | Wt >= 6400 oz

## 2013-06-12 DIAGNOSIS — M549 Dorsalgia, unspecified: Secondary | ICD-10-CM

## 2013-06-12 DIAGNOSIS — C549 Malignant neoplasm of corpus uteri, unspecified: Secondary | ICD-10-CM

## 2013-06-12 DIAGNOSIS — Z5111 Encounter for antineoplastic chemotherapy: Secondary | ICD-10-CM

## 2013-06-12 DIAGNOSIS — C786 Secondary malignant neoplasm of retroperitoneum and peritoneum: Secondary | ICD-10-CM

## 2013-06-12 DIAGNOSIS — R188 Other ascites: Secondary | ICD-10-CM

## 2013-06-12 DIAGNOSIS — I82409 Acute embolism and thrombosis of unspecified deep veins of unspecified lower extremity: Secondary | ICD-10-CM

## 2013-06-12 DIAGNOSIS — C541 Malignant neoplasm of endometrium: Secondary | ICD-10-CM

## 2013-06-12 LAB — COMPREHENSIVE METABOLIC PANEL (CC13)
ALBUMIN: 3.1 g/dL — AB (ref 3.5–5.0)
ALT: 31 U/L (ref 0–55)
AST: 35 U/L — ABNORMAL HIGH (ref 5–34)
Alkaline Phosphatase: 64 U/L (ref 40–150)
Anion Gap: 11 mEq/L (ref 3–11)
BUN: 7.2 mg/dL (ref 7.0–26.0)
CALCIUM: 9.5 mg/dL (ref 8.4–10.4)
CHLORIDE: 102 meq/L (ref 98–109)
CO2: 27 meq/L (ref 22–29)
Creatinine: 0.6 mg/dL (ref 0.6–1.1)
Glucose: 114 mg/dl (ref 70–140)
Potassium: 3.6 mEq/L (ref 3.5–5.1)
SODIUM: 140 meq/L (ref 136–145)
TOTAL PROTEIN: 6.3 g/dL — AB (ref 6.4–8.3)
Total Bilirubin: 0.4 mg/dL (ref 0.20–1.20)

## 2013-06-12 LAB — CBC WITH DIFFERENTIAL/PLATELET
BASO%: 0.4 % (ref 0.0–2.0)
BASOS ABS: 0 10*3/uL (ref 0.0–0.1)
EOS ABS: 0.1 10*3/uL (ref 0.0–0.5)
EOS%: 1 % (ref 0.0–7.0)
HCT: 36.1 % (ref 34.8–46.6)
HGB: 11.2 g/dL — ABNORMAL LOW (ref 11.6–15.9)
LYMPH%: 24.2 % (ref 14.0–49.7)
MCH: 31.5 pg (ref 25.1–34.0)
MCHC: 31 g/dL — ABNORMAL LOW (ref 31.5–36.0)
MCV: 101.7 fL — ABNORMAL HIGH (ref 79.5–101.0)
MONO#: 0.6 10*3/uL (ref 0.1–0.9)
MONO%: 13.1 % (ref 0.0–14.0)
NEUT%: 61.3 % (ref 38.4–76.8)
NEUTROS ABS: 3 10*3/uL (ref 1.5–6.5)
NRBC: 0 % (ref 0–0)
PLATELETS: 242 10*3/uL (ref 145–400)
RBC: 3.55 10*6/uL — ABNORMAL LOW (ref 3.70–5.45)
RDW: 18.2 % — AB (ref 11.2–14.5)
WBC: 4.9 10*3/uL (ref 3.9–10.3)
lymph#: 1.2 10*3/uL (ref 0.9–3.3)

## 2013-06-12 MED ORDER — SODIUM CHLORIDE 0.9 % IV SOLN
Freq: Once | INTRAVENOUS | Status: AC
  Administered 2013-06-12: 11:00:00 via INTRAVENOUS

## 2013-06-12 MED ORDER — HEPARIN SOD (PORK) LOCK FLUSH 100 UNIT/ML IV SOLN
500.0000 [IU] | Freq: Once | INTRAVENOUS | Status: AC | PRN
  Administered 2013-06-12: 500 [IU]
  Filled 2013-06-12: qty 5

## 2013-06-12 MED ORDER — OXYCODONE-ACETAMINOPHEN 5-325 MG PO TABS: 1.0000 | ORAL_TABLET | Freq: Once | ORAL | Status: DC

## 2013-06-12 MED ORDER — DIPHENHYDRAMINE HCL 50 MG/ML IJ SOLN
INTRAMUSCULAR | Status: AC
  Filled 2013-06-12: qty 1

## 2013-06-12 MED ORDER — SODIUM CHLORIDE 0.9 % IJ SOLN
10.0000 mL | INTRAMUSCULAR | Status: DC | PRN
  Administered 2013-06-12: 10 mL
  Filled 2013-06-12: qty 10

## 2013-06-12 MED ORDER — PALONOSETRON HCL INJECTION 0.25 MG/5ML
INTRAVENOUS | Status: AC
  Filled 2013-06-12: qty 5

## 2013-06-12 MED ORDER — PACLITAXEL CHEMO INJECTION 300 MG/50ML
175.0000 mg/m2 | Freq: Once | INTRAVENOUS | Status: AC
  Administered 2013-06-12: 618 mg via INTRAVENOUS
  Filled 2013-06-12: qty 103

## 2013-06-12 MED ORDER — SODIUM CHLORIDE 0.9 % IV SOLN
150.0000 mg | Freq: Once | INTRAVENOUS | Status: AC
  Administered 2013-06-12: 150 mg via INTRAVENOUS
  Filled 2013-06-12: qty 5

## 2013-06-12 MED ORDER — FAMOTIDINE IN NACL 20-0.9 MG/50ML-% IV SOLN
20.0000 mg | Freq: Once | INTRAVENOUS | Status: AC
  Administered 2013-06-12: 20 mg via INTRAVENOUS

## 2013-06-12 MED ORDER — DEXAMETHASONE SODIUM PHOSPHATE 20 MG/5ML IJ SOLN
12.0000 mg | Freq: Once | INTRAMUSCULAR | Status: AC
  Administered 2013-06-12: 12 mg via INTRAVENOUS

## 2013-06-12 MED ORDER — CARBOPLATIN CHEMO INTRADERMAL TEST DOSE 100MCG/0.02ML
100.0000 ug | Freq: Once | INTRADERMAL | Status: AC
  Administered 2013-06-12: 100 ug via INTRADERMAL
  Filled 2013-06-12: qty 0.01

## 2013-06-12 MED ORDER — OXYCODONE-ACETAMINOPHEN 5-325 MG PO TABS
ORAL_TABLET | ORAL | Status: AC
  Filled 2013-06-12: qty 2

## 2013-06-12 MED ORDER — DIPHENHYDRAMINE HCL 50 MG/ML IJ SOLN
50.0000 mg | Freq: Once | INTRAMUSCULAR | Status: AC
  Administered 2013-06-12: 50 mg via INTRAVENOUS

## 2013-06-12 MED ORDER — FAMOTIDINE IN NACL 20-0.9 MG/50ML-% IV SOLN
INTRAVENOUS | Status: AC
  Filled 2013-06-12: qty 50

## 2013-06-12 MED ORDER — PALONOSETRON HCL INJECTION 0.25 MG/5ML
0.2500 mg | Freq: Once | INTRAVENOUS | Status: AC
  Administered 2013-06-12: 0.25 mg via INTRAVENOUS

## 2013-06-12 MED ORDER — SODIUM CHLORIDE 0.9 % IV SOLN
900.0000 mg | Freq: Once | INTRAVENOUS | Status: AC
  Administered 2013-06-12: 900 mg via INTRAVENOUS
  Filled 2013-06-12: qty 90

## 2013-06-12 MED ORDER — OXYCODONE-ACETAMINOPHEN 5-325 MG PO TABS
2.0000 | ORAL_TABLET | ORAL | Status: AC
  Administered 2013-06-12: 2 via ORAL

## 2013-06-12 MED ORDER — DEXAMETHASONE SODIUM PHOSPHATE 20 MG/5ML IJ SOLN
INTRAMUSCULAR | Status: AC
  Filled 2013-06-12: qty 5

## 2013-06-12 NOTE — Patient Instructions (Signed)
[facility] Discharge Instructions for Patients Receiving Chemotherapy  Today you received the following chemotherapy agents Taxol, Carbo.   To help prevent nausea and vomiting after your treatment, we encourage you to take your nausea medication as prescribed.    If you develop nausea and vomiting that is not controlled by your nausea medication, call the clinic.   BELOW ARE SYMPTOMS THAT SHOULD BE REPORTED IMMEDIATELY:  *FEVER GREATER THAN 100.5 F  *CHILLS WITH OR WITHOUT FEVER  NAUSEA AND VOMITING THAT IS NOT CONTROLLED WITH YOUR NAUSEA MEDICATION  *UNUSUAL SHORTNESS OF BREATH  *UNUSUAL BRUISING OR BLEEDING  TENDERNESS IN MOUTH AND THROAT WITH OR WITHOUT PRESENCE OF ULCERS  *URINARY PROBLEMS  *BOWEL PROBLEMS  UNUSUAL RASH Items with * indicate a potential emergency and should be followed up as soon as possible.  Feel free to call the clinic should you have any questions or concerns. The clinic phone number is [phone].  It was my pleasure to take care of you today!  Leeanne Rio, RN

## 2013-06-12 NOTE — Progress Notes (Signed)
[place]  Telephone:([phone]) [phone] Fax:(336) [phone]  OFFICE PROGRESS NOTE    PATIENT: [name]   DOB: 11/07/1965  MR#: [id number]  ION#:[id number]   KG:[id number], [name], MD GYN ONC: Janie Morning, M.D. SU:  Donnie Mesa, M.D.    DIAGNOSIS: [name] is a 48 y.o. female with a prior history of stage IA uterine cancer diagnosed in 2010.  Now with recurrent disease diagnosed in 08/2012.  PRIOR THERAPY: #1  Patient developed excessive uterine bleeding.  She underwent dilatation and curettage with hysteroscopy. 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 Due to morbid obesity Mirena IUD was placed along with Aygestin. Patient had a dramatic directed weight loss with nutritional support she went from 523 pounds to 359 pounds in 10/2009.  On 12/27/2009 she underwent a robotic-assisted laparoscopic hysterectomy, bilateral salpingo-oophorectomy, and mini laparotomy through the umbilical port with morcellation of uterus within about the delivery of the uterus. The final pathology revealed an endometrial adenocarcinoma grade 2 with invasion limited to 1 mm of the myometrium.  #2  Patient continued to do well until 09/2012 when she developed right upper quadrant pain. It was presumed to be cholelithiasis. 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 Omental biopsies were obtained. The pathology was consistent with metastatic adenocarcinoma with the presumption of recurrent endometrial carcinoma.  She was seen by Dr. Janie Morning on 10/07/2012.  Dr. Skeet Latch recommended that Ms. Gloor undergo chemotherapy consisting of Taxol and Carboplatinum.  Chemotherapy started on 11/04/2012.   #3 Abdominal ascites with multiple paracenteses.  #4 Back pain and subsequent MRI on 02/05/2013 revealed compression fractures of T10 and T11.    #5 Status post kyphoplasty with Dr. Luanne Bras on 02/26/2013.  Patient states her back pain continues.  #6 Patient hospitalized and underwent more MRI's on 12/4 and L1 and L2 compression fractures were noted, after consultation with Dr. Sherwood Gambler, the patient was referred back to interventional radiology for evaluation for kyphoplasty and this was performed on 04/24/13.    #7 Patient diagnosed with right upper extremity DVT on 04/13/13.  She was initially admitted to the hospital and started on a heparin gtt to remove her port.  She was then transitioned to Enoxaparin BID.     CURRENT THERAPY:   Cycle 10 day 1 of Taxol carboplatinum  INTERVAL HISTORY: Patient is seen in followup today to begin cycle 10 of Taxol carboplatinum. They understand the rationale for this. We discussed risks and benefits of that. Clinically she seems to be doing well and is without any significant problems. She has noticed that she is losing her hair now. She denies any pain. Her pain is really well controlled. Her right upper extremity looks much better. She denies any fevers chills night sweats headaches she has no shortness of breath chest pains or palpitations. She has not had any recurrence of her rashes. Her skin of her hands is very dry and I have recommended that some very good emollients and creams to try to moisturize her body. This could be given to the chemotherapy. She has no bleeding problems. Her bowels and bladder is working well. Remainder of the 10 point systems is negative.  PAST MEDICAL HISTORY: Past Medical History  Diagnosis Date  . Obesity   . DVT (deep venous thrombosis) 2009    RLE, tx for ~6 months  . PONV (postoperative nausea and vomiting)   .  Swelling     BOTH LEGS  . Rash     LOWER ABD  . Gallstones   . Ascites   . Uterine cancer     Dr. Humphrey Rolls, chemotherapy  . Family history of anesthesia complication     MOTHER IS DIFFICULT TO WAKE  . Gallstones   . Cellulitis of leg, right      PAST SURGICAL HISTORY: Past Surgical History  Procedure Laterality Date  . Ankle surgery  1990  . Wisdom tooth extraction    . Tonsillectomy and adenoidectomy    . Abdominal hysterectomy  12/2009    [facility], [facility]  . Laparoscopy N/A 10/02/2012    Procedure: LAPAROSCOPY DIAGNOSTIC, perocentisis, omental biopsy;  Surgeon: Imogene Burn. Tsuei, MD;  Location: [facility];  Service: General;  Laterality: N/A;  removed a total of 15,000 of acities  . Paracentesis    . Kyphoplasty  02/26/2013    FAMILY HISTORY: Family History  Problem Relation Age of Onset  . Heart disease Father   . Diabetes Brother   . Heart disease Mother   . Cirrhosis Father   . Diabetes Father   . Thyroid disease Mother   . Diabetes Mother   . Kidney disease Brother     from diabetes  . Breast cancer Maternal Aunt   . Breast cancer Maternal Aunt   . Melanoma Maternal Aunt   . Melanoma Maternal Aunt   . Other Maternal Aunt     lupus anticardiolipin ab syndrome    SOCIAL HISTORY: History  Substance Use Topics  . Smoking status: Never Smoker   . Smokeless tobacco: Never Used  . Alcohol Use: Yes     Comment: social, Occassionally    ALLERGIES: No Known Allergies   MEDICATIONS:  Current Outpatient Prescriptions  Medication Sig Dispense Refill  . Ascorbic Acid (VITAMIN C) 1000 MG tablet Take 1,000 mg by mouth daily.      Marland Kitchen b complex vitamins capsule Take 1 capsule by mouth daily.      Marland Kitchen CARAFATE 1 GM/10ML suspension TAKE 10 MLS BY MOUTH 4 TIMES A DAY  420 mL  2  . Cyanocobalamin (VITAMIN B-12 PO) Take 2,500 mg by mouth daily.       . cyclobenzaprine (FLEXERIL) 5 MG tablet TAKE 1 TABLET BY MOUTH 3 TIMES A DAY AS NEEDED FOR MUSCLE SPASMS  60 tablet  1  . enoxaparin (LOVENOX) 150 MG/ML injection INJECT 1 SYRINGE EVERY 12 HOURS  60 mL  0  . ferrous sulfate 325 (65 FE) MG tablet Take 1 tablet (325 mg total) by mouth 3 (three) times daily with meals.  90 tablet  2  . folic acid (FOLVITE) A999333 MCG tablet Take 400 mcg by  mouth daily.        . furosemide (LASIX) 20 MG tablet Take 1 tablet (20 mg total) by mouth every other day.  30 tablet  0  . loratadine (CLARITIN) 10 MG tablet Take 10 mg by mouth daily.      . magnesium gluconate (MAGONATE) 500 MG tablet Take 500 mg by mouth daily.      . miconazole nitrate (MICATIN) POWD Apply 1 application topically 4 (four) times daily.  10 g  2  . Multiple Vitamins-Minerals (MULTIVITAMIN WITH MINERALS) tablet Take 1 tablet by mouth daily.       Marland Kitchen nystatin ointment (MYCOSTATIN) Apply 1 application topically 2 (two) times daily.      . ondansetron (ZOFRAN ODT) 8 MG disintegrating tablet Take 1 tablet (  8 mg total) by mouth every 8 (eight) hours as needed for nausea or vomiting.  20 tablet  0  . ondansetron (ZOFRAN) 8 MG tablet Take 1 tablet (8 mg total) by mouth 2 (two) times daily. Start day after chemo then two times daily as needed  20 tablet  1  . oxyCODONE-acetaminophen (PERCOCET) 10-325 MG per tablet Take 1 tablet by mouth every 4 (four) hours as needed for pain.  90 tablet  0  . PACLitaxel (TAXOL IV) Inject 1 each into the vein every 21 ( twenty-one) days.      . pantoprazole (PROTONIX) 40 MG tablet Take 40 mg by mouth daily.      . pegfilgrastim (NEULASTA) 6 MG/0.6ML injection Inject 6 mg into the skin every 21 ( twenty-one) days.      . potassium chloride (K-DUR) 10 MEQ tablet Take 2 tablets (20 mEq total) by mouth daily.  14 tablet  0  . PRESCRIPTION MEDICATION every 21 ( twenty-one) days. CHEMOTHERAPY REGIMEN      . prochlorperazine (COMPAZINE) 10 MG tablet Take 1 tablet (10 mg total) by mouth every 6 (six) hours as needed (Nausea or vomiting).  30 tablet  1  . promethazine (PHENERGAN) 25 MG tablet TAKE 1/2 TABLET BY MOUTH EVERY 6 HOURS PRN FOR NAUSEA.  15 tablet  1   No current facility-administered medications for this visit.   Facility-Administered Medications Ordered in Other Visits  Medication Dose Route Frequency Provider Last Rate Last Dose  . CARBOplatin  CHEMO intradermal Test Dose 100 mcg/0.75ml  100 mcg Intradermal Once Deatra Robinson, MD          REVIEW OF SYSTEMS: A 10 point review of systems was completed and is negative except as noted above.   PHYSICAL EXAMINATION: BP 113/75  Pulse 105  Temp(Src) 97.9 F (36.6 C) (Oral)  Resp 20  Ht 5\' 11"  (1.803 m)  Wt 415 lb 9.6 oz (188.515 kg)  BMI 57.99 kg/m2  General: Patient is a well appearing female in no acute distress HEENT: PERRLA, sclerae anicteric no conjunctival pallor, MMM Neck: supple, no palpable adenopathy Lungs: clear to auscultation bilaterally, no wheezes, rhonchi, or rales Cardiovascular: regular rate rhythm, S1, S2, no murmurs, rubs or gallops Abdomen: Soft, non-tender, non-distended, normoactive bowel sounds, no HSM, limited exam patient morbidly obese, erythema to mid abdomen, no tenderness or warmrth Extremities: warm and well perfused, no clubbing, cyanosis, or edema, strength 5/5 all extremities Skin: no rashes or lesions, right breast with erythema and swelling, left hand with plaque like erythema on fingertips and fingers up to knuckles of all 5 digits.  No warmth, tenderness Neuro: Non-focal ECOG FS:  3 - Symptomatic, >50% confined to bed   LAB RESULTS: Lab Results  Component Value Date   WBC 4.9 06/12/2013   NEUTROABS 3.0 06/12/2013   HGB 11.2* 06/12/2013   HCT 36.1 06/12/2013   MCV 101.7* 06/12/2013   PLT 242 06/12/2013      Chemistry      Component Value Date/Time   NA 140 05/28/2013 0928   NA 145 04/24/2013 0824   K 3.4* 05/28/2013 0928   K 3.4* 04/24/2013 0824   CL 104 04/24/2013 0824   CL 98 10/07/2012 0923   CO2 30* 05/28/2013 0928   CO2 31 04/24/2013 0824   BUN 4.1* 05/28/2013 0928   BUN 6 04/24/2013 0824   CREATININE 0.6 05/28/2013 0928   CREATININE 0.47* 04/24/2013 0824      Component Value Date/Time  CALCIUM 9.8 05/28/2013 0928   CALCIUM 8.1* 04/24/2013 0824   ALKPHOS 87 05/28/2013 0928   ALKPHOS 179* 03/12/2013 0845   AST 47* 05/28/2013 0928   AST 42*  03/12/2013 0845   ALT 41 05/28/2013 0928   ALT 53* 03/12/2013 0845   BILITOT 0.46 05/28/2013 0928   BILITOT 0.5 03/12/2013 0845       No results found for this basename: LABCA2    No components found with this basename: [id number]     RADIOGRAPHIC STUDIES: Ir Vertebroplasty Or Sacroplasty 03/02/2013   CLINICAL DATA:  Patient with severely painful compression fracture at T10 and T11. History of endometrial carcinoma. On chemotherapy.  EXAM: VERTEBROPLASTY FL AT T10 and T11.  CORE BIOPSIES OBTAINED  MEDICATIONS: Versed 4 mg. mg IV, Fentanyl 100  mcg IV.  ANESTHESIA/SEDATION: Total Moderate Sedation Time:  35 min.  FLUOROSCOPY TIME:  8 min 48 seconds. Marland Kitchen  PROCEDURE: Following a full explanation of the procedure along with the potentially associated complications, a witnessed informed consent was obtained.  The patient was placed prone on the fluoroscopic table. Nasal oxygen was administered. Physiologic monitoring was performed throughout the duration of the procedure. The skin overlying the thoracic region was prepped and draped in the usual sterile fashion. The T10 and T11 vertebral bodies were identified and the right pedicle at T10, and the left pedicle at T11 were infiltrated with 0.25% Bupivacaine. This was then followed by the advancement of a 13-gauge Cook needle through both the pedicles into the anterior one-third at both levels. Prior to this, 18 gauge core biopsy needles were used to obtain 4 samples of both the levels using a 20 mL syringe. These were sent for pathologic analysis. A gentle contrast injection demonstrated a trabecular pattern of contrast.  At this time, methylmethacrylate mixture was reconstituted. Under biplane intermittent fluoroscopy, the methylmethacrylate was then injected into the T10 and T11 vertebral body with filling of the vertebral body at T10 and T12.  Mild extravasation was noted into the disk spaces through the inferior endplate fracture clefts . No epidural venous  contamination was seen.  The needles were then removed. Hemostasis was achieved at the skin entry site.  There were no acute complications. Patient tolerated the procedure well. The patient was observed for 3 hours and discharged in good condition.  IMPRESSION: Status post vertebral body augmentation for painful compression fracture at T10 and T11 using vertebroplasty technique.  Core biopsies obtained at T10 and T11. Sample sent for pathologic analysis.   Electronically Signed   By: Luanne Bras M.D.   On: 02/26/2013 13:10      ASSESSMENT/PLAN: JALIYAH MUNIER is a 48 y.o. woman:  #1 Recurrent uterine cancer with peritoneal carcinomatosis and ascites:. Patient will proceed with cycle 10 day 1 of Taxol carboplatinum. Risks benefits and side effects of treatment were discussed. She understands that she may continue this for a few more cycles. She understands that she may get another test dose for the carboplatinum. She knows that she is at risk for developing allergic reactions. She consents to proceeding with her therapy.  #2 Ascites: Patient has had multiple paracenteses performed. However with chemotherapy we have not had to do a paracentesis for quite some time. At this time we will continue to follow.  #3 Back pain - Compression fractures at T10 and T11 per MRI on 02/05/2013.  Status post kyphoplasty on 02/26/2013.  Patient with repeat MRI on 12/4 demonstrating compression fractures at L1 and L2.  Patient underwent  L1 and L2 kyphoplasty on 04/24/12.  Her pain is much improved. She does have followup with interventional radiology.  #4  Right upper extremity DVT: Diagnosed 04/13/13.  She was started on a Heparin gtt and admitted to the hospital where she underwent right port removal and transitioned to Lovenox BID.  She is tolerating the Lovenox well.  For now we will continue Lovenox.  #5 right breast swelling: Patient has had a mammogram performed which did not reveal any evidence of masses. I  do think this may be secondary to the right upper extremity edema and thrombosis. We will continue to monitor this.  #6 rash on left hand: This is significantly improved.  #7 PICC line: Patient has a PICC line in her left upper extremity. Currently she is having home health, and do the dressing changes. However her mom tells me that home health will stop coming. And she would like to learn how to do the dressing changes. Therefore I have asked my nurses to go ahead and educate them.  #8 followup: Patient will be seen back in one week for followup and blood work as well as chemotherapy toxicity assessment.  The length of time of the face-to-face encounter was 25    minutes. More than 50% of time was spent counseling and coordination of care.   Marcy Panning, MD Medical/Oncology [facility] [phone] (beeper) [phone] (Office)  06/12/2013, 10:22 AM

## 2013-06-12 NOTE — Telephone Encounter (Signed)
added lb/fu for 2/27. pt given appt schedule while in inf.

## 2013-06-13 ENCOUNTER — Ambulatory Visit (HOSPITAL_BASED_OUTPATIENT_CLINIC_OR_DEPARTMENT_OTHER): Payer: BC Managed Care – PPO

## 2013-06-13 VITALS — BP 118/78 | HR 97 | Temp 97.5°F | Resp 18

## 2013-06-13 DIAGNOSIS — C549 Malignant neoplasm of corpus uteri, unspecified: Secondary | ICD-10-CM

## 2013-06-13 DIAGNOSIS — Z5189 Encounter for other specified aftercare: Secondary | ICD-10-CM

## 2013-06-13 MED ORDER — PEGFILGRASTIM INJECTION 6 MG/0.6ML
6.0000 mg | Freq: Once | SUBCUTANEOUS | Status: AC
  Administered 2013-06-13: 6 mg via SUBCUTANEOUS

## 2013-06-13 NOTE — Patient Instructions (Signed)

## 2013-06-14 ENCOUNTER — Encounter: Payer: Self-pay | Admitting: Oncology

## 2013-06-15 ENCOUNTER — Other Ambulatory Visit: Payer: BC Managed Care – PPO

## 2013-06-15 ENCOUNTER — Ambulatory Visit: Payer: BC Managed Care – PPO | Admitting: Adult Health

## 2013-06-18 ENCOUNTER — Other Ambulatory Visit: Payer: Self-pay | Admitting: *Deleted

## 2013-06-18 ENCOUNTER — Telehealth: Payer: Self-pay

## 2013-06-18 DIAGNOSIS — C541 Malignant neoplasm of endometrium: Secondary | ICD-10-CM

## 2013-06-18 NOTE — Telephone Encounter (Signed)
Rcvd msg from [facility] stating pt insurance will no longer pay for home health visits for PICC care.  Return call attempted - no answer - mail box was full - unable to lv message.

## 2013-06-19 ENCOUNTER — Other Ambulatory Visit (HOSPITAL_BASED_OUTPATIENT_CLINIC_OR_DEPARTMENT_OTHER): Payer: BC Managed Care – PPO

## 2013-06-19 ENCOUNTER — Ambulatory Visit (HOSPITAL_BASED_OUTPATIENT_CLINIC_OR_DEPARTMENT_OTHER): Payer: BC Managed Care – PPO | Admitting: Hematology and Oncology

## 2013-06-19 ENCOUNTER — Other Ambulatory Visit: Payer: Self-pay | Admitting: *Deleted

## 2013-06-19 VITALS — BP 115/81 | HR 102 | Temp 97.6°F | Resp 20 | Ht 71.0 in | Wt >= 6400 oz

## 2013-06-19 DIAGNOSIS — R188 Other ascites: Secondary | ICD-10-CM

## 2013-06-19 DIAGNOSIS — S32020A Wedge compression fracture of second lumbar vertebra, initial encounter for closed fracture: Secondary | ICD-10-CM

## 2013-06-19 DIAGNOSIS — C549 Malignant neoplasm of corpus uteri, unspecified: Secondary | ICD-10-CM

## 2013-06-19 DIAGNOSIS — N63 Unspecified lump in unspecified breast: Secondary | ICD-10-CM

## 2013-06-19 DIAGNOSIS — M549 Dorsalgia, unspecified: Secondary | ICD-10-CM

## 2013-06-19 DIAGNOSIS — C541 Malignant neoplasm of endometrium: Secondary | ICD-10-CM

## 2013-06-19 DIAGNOSIS — I82409 Acute embolism and thrombosis of unspecified deep veins of unspecified lower extremity: Secondary | ICD-10-CM

## 2013-06-19 DIAGNOSIS — S32010A Wedge compression fracture of first lumbar vertebra, initial encounter for closed fracture: Secondary | ICD-10-CM

## 2013-06-19 DIAGNOSIS — C786 Secondary malignant neoplasm of retroperitoneum and peritoneum: Secondary | ICD-10-CM

## 2013-06-19 DIAGNOSIS — Z452 Encounter for adjustment and management of vascular access device: Secondary | ICD-10-CM

## 2013-06-19 LAB — COMPREHENSIVE METABOLIC PANEL (CC13)
ALBUMIN: 3.7 g/dL (ref 3.5–5.0)
ALT: 57 U/L — AB (ref 0–55)
ANION GAP: 14 meq/L — AB (ref 3–11)
AST: 43 U/L — ABNORMAL HIGH (ref 5–34)
Alkaline Phosphatase: 93 U/L (ref 40–150)
BUN: 5.3 mg/dL — ABNORMAL LOW (ref 7.0–26.0)
CO2: 26 meq/L (ref 22–29)
Calcium: 9.7 mg/dL (ref 8.4–10.4)
Chloride: 99 mEq/L (ref 98–109)
Creatinine: 0.7 mg/dL (ref 0.6–1.1)
Glucose: 137 mg/dl (ref 70–140)
POTASSIUM: 3.2 meq/L — AB (ref 3.5–5.1)
SODIUM: 139 meq/L (ref 136–145)
TOTAL PROTEIN: 7.3 g/dL (ref 6.4–8.3)
Total Bilirubin: 0.47 mg/dL (ref 0.20–1.20)

## 2013-06-19 LAB — CBC WITH DIFFERENTIAL/PLATELET
BASO%: 0.9 % (ref 0.0–2.0)
Basophils Absolute: 0 10*3/uL (ref 0.0–0.1)
EOS ABS: 0.1 10*3/uL (ref 0.0–0.5)
EOS%: 2.7 % (ref 0.0–7.0)
HCT: 34.6 % — ABNORMAL LOW (ref 34.8–46.6)
HGB: 11.3 g/dL — ABNORMAL LOW (ref 11.6–15.9)
LYMPH#: 0.9 10*3/uL (ref 0.9–3.3)
LYMPH%: 33 % (ref 14.0–49.7)
MCH: 32.1 pg (ref 25.1–34.0)
MCHC: 32.6 g/dL (ref 31.5–36.0)
MCV: 98.3 fL (ref 79.5–101.0)
MONO#: 0.3 10*3/uL (ref 0.1–0.9)
MONO%: 10.1 % (ref 0.0–14.0)
NEUT%: 53.3 % (ref 38.4–76.8)
NEUTROS ABS: 1.5 10*3/uL (ref 1.5–6.5)
PLATELETS: 143 10*3/uL — AB (ref 145–400)
RBC: 3.52 10*6/uL — AB (ref 3.70–5.45)
RDW: 18.6 % — ABNORMAL HIGH (ref 11.2–14.5)
WBC: 2.9 10*3/uL — AB (ref 3.9–10.3)

## 2013-06-19 MED ORDER — SODIUM CHLORIDE 0.9 % IJ SOLN
10.0000 mL | INTRAMUSCULAR | Status: DC | PRN
  Administered 2013-06-19: 10 mL via INTRAVENOUS
  Filled 2013-06-19: qty 10

## 2013-06-19 MED ORDER — HEPARIN SOD (PORK) LOCK FLUSH 100 UNIT/ML IV SOLN
500.0000 [IU] | Freq: Once | INTRAVENOUS | Status: AC
  Administered 2013-06-19: 250 [IU] via INTRAVENOUS
  Filled 2013-06-19: qty 5

## 2013-06-19 MED ORDER — OXYCODONE-ACETAMINOPHEN 10-325 MG PO TABS: 1.0000 | ORAL_TABLET | ORAL | Status: DC | PRN

## 2013-06-19 NOTE — Progress Notes (Signed)
Patient c/o pain in back. Family wanted to have pain pill prescription and not be seen.  Charlestine Massed, NP

## 2013-06-20 ENCOUNTER — Encounter: Payer: Self-pay | Admitting: Hematology and Oncology

## 2013-06-23 ENCOUNTER — Other Ambulatory Visit: Payer: BC Managed Care – PPO

## 2013-06-23 ENCOUNTER — Ambulatory Visit: Payer: BC Managed Care – PPO | Admitting: Oncology

## 2013-06-24 NOTE — Progress Notes (Signed)
[facility]  [phone] [phone]) [phone]  OFFICE PROGRESS NOTE    PATIENT: [name]   DOB: December 04, 1965  MR#: [id number]  [id number]   [id number], [name], MD GYN ONC: Janie Morning, M.D. SU:  Donnie Mesa, M.D.    DIAGNOSIS: [name] is a 48 y.o. female with a prior history of stage IA uterine cancer diagnosed in 2010.  Now with recurrent disease diagnosed in 08/2012.  PRIOR THERAPY: #1  Patient developed excessive uterine bleeding.  She underwent dilatation and curettage with hysteroscopy. 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 Due to morbid obesity Mirena IUD was placed along with Aygestin. Patient had a dramatic directed weight loss with nutritional support she went from 523 pounds to 359 pounds in 10/2009.  On 12/27/2009 she underwent a robotic-assisted laparoscopic hysterectomy, bilateral salpingo-oophorectomy, and mini laparotomy through the umbilical port with morcellation of uterus within about the delivery of the uterus. The final pathology revealed an endometrial adenocarcinoma grade 2 with invasion limited to 1 mm of the myometrium.  #2  Patient continued to do well until 09/2012 when she developed right upper quadrant pain. It was presumed to be cholelithiasis. 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 Omental biopsies were obtained. The pathology was consistent with metastatic adenocarcinoma with the presumption of recurrent endometrial carcinoma.  She was seen by Dr. Janie Morning on 10/07/2012.  Dr. Skeet Latch recommended that Ms. Rudy undergo chemotherapy consisting of Taxol and Carboplatinum.  Chemotherapy started on 11/04/2012.   #3 Abdominal ascites with multiple paracenteses.  #4 Back pain and subsequent MRI on 02/05/2013 revealed compression fractures of T10 and T11.    #5 Status post kyphoplasty with Dr. Luanne Bras on 02/26/2013.  Patient states her back pain continues.  #6 Patient hospitalized and underwent more MRI's on 12/4 and L1 and L2 compression fractures were noted, after consultation with Dr. Sherwood Gambler, the patient was referred back to interventional radiology for evaluation for kyphoplasty and this was performed on 04/24/13.    #7 Patient diagnosed with right upper extremity DVT on 04/13/13.  She was initially admitted to the hospital and started on a heparin gtt to remove her port.  She was then transitioned to Enoxaparin BID.     CURRENT THERAPY:   Cycle 10 day 8 of Taxol carboplatinum  INTERVAL HISTORY: Patient is seen in followup today  cycle 10 day 7 of Taxol/ carboplatinum.  Clinically she is doing fair.She feels very tired after each chemotherapy. She  reports severe low back pain. Her pain is  not  well controlled with Dilaudid using about 6/day.She is requesting a refill. She uses a wheelchair to come to the clinic,potty at home and ongoing physicotherapy to help her increase her activity.She states her pain got some better after kyphoplasty since before was using an ambulance for transportation.She is not taking Calcium or Vit D. Her weight is decreased by 13 pounds. Her right upper extremity looks  better.She is concerned about the rash under both her breasts and using      Nystatin ointment.  She denies any fevers chills headaches she has no shortness of breath chest pains or palpitations.  Her skin of her hands is very dry and  using  very good emollients and creams to try to moisturize her body. She has no bleeding problems. Her bowels and bladder are working well. She continues on  Lovenox 150 mg SQ twice daily since 03/2013 due to recurrent blood clot    Remainder of the 10 point systems is negative.  PAST MEDICAL HISTORY: Past Medical History  Diagnosis Date  . Obesity   . DVT (deep venous thrombosis) 2009    RLE, tx for ~6  months  . PONV (postoperative nausea and vomiting)   . Swelling     BOTH LEGS  . Rash     LOWER ABD  . Gallstones   . Ascites   . Uterine cancer     Dr. Humphrey Rolls, chemotherapy  . Family history of anesthesia complication     MOTHER IS DIFFICULT TO WAKE  . Gallstones   . Cellulitis of leg, right     PAST SURGICAL HISTORY: Past Surgical History  Procedure Laterality Date  . Ankle surgery  1990  . Wisdom tooth extraction    . Tonsillectomy and adenoidectomy    . Abdominal hysterectomy  12/2009    [facility], [facility]  . Laparoscopy N/A 10/02/2012    Procedure: LAPAROSCOPY DIAGNOSTIC, perocentisis, omental biopsy;  Surgeon: Imogene Burn. Tsuei, MD;  Location: [facility];  Service: General;  Laterality: N/A;  removed a total of 15,000 of acities  . Paracentesis    . Kyphoplasty  02/26/2013    FAMILY HISTORY: Family History  Problem Relation Age of Onset  . Heart disease Father   . Diabetes Brother   . Heart disease Mother   . Cirrhosis Father   . Diabetes Father   . Thyroid disease Mother   . Diabetes Mother   . Kidney disease Brother     from diabetes  . Breast cancer Maternal Aunt   . Breast cancer Maternal Aunt   . Melanoma Maternal Aunt   . Melanoma Maternal Aunt   . Other Maternal Aunt     lupus anticardiolipin ab syndrome    SOCIAL HISTORY: History  Substance Use Topics  . Smoking status: Never Smoker   . Smokeless tobacco: Never Used  . Alcohol Use: Yes     Comment: social, Occassionally    ALLERGIES: No Known Allergies   MEDICATIONS:  Current Outpatient Prescriptions  Medication Sig Dispense Refill  . Ascorbic Acid (VITAMIN C) 1000 MG tablet Take 1,000 mg by mouth daily.      Marland Kitchen b complex vitamins capsule Take 1 capsule by mouth daily.      Marland Kitchen CARAFATE 1 GM/10ML suspension TAKE 10 MLS BY MOUTH 4 TIMES A DAY  420 mL  2  . Cyanocobalamin (VITAMIN B-12 PO) Take 2,500 mg by mouth daily.       . cyclobenzaprine (FLEXERIL) 5 MG tablet TAKE 1 TABLET BY MOUTH 3 TIMES A DAY AS  NEEDED FOR MUSCLE SPASMS  60 tablet  1  . enoxaparin (LOVENOX) 150 MG/ML injection INJECT 1 SYRINGE EVERY 12 HOURS  60 mL  0  . ferrous sulfate 325 (65 FE) MG tablet Take 1 tablet (325 mg total) by mouth 3 (three) times daily with meals.  90 tablet  2  . folic acid (FOLVITE) 885 MCG tablet Take 400 mcg by mouth daily.        . furosemide (LASIX) 20 MG tablet Take 1 tablet (20 mg total) by mouth every other day.  30 tablet  0  . loratadine (CLARITIN) 10 MG tablet Take 10 mg by mouth daily.      . magnesium gluconate (MAGONATE) 500 MG tablet Take 500 mg by mouth daily.      . miconazole nitrate (  MICATIN) POWD Apply 1 application topically 4 (four) times daily.  10 g  2  . Multiple Vitamins-Minerals (MULTIVITAMIN WITH MINERALS) tablet Take 1 tablet by mouth daily.       Marland Kitchen nystatin ointment (MYCOSTATIN) Apply 1 application topically 2 (two) times daily.      . ondansetron (ZOFRAN ODT) 8 MG disintegrating tablet Take 1 tablet (8 mg total) by mouth every 8 (eight) hours as needed for nausea or vomiting.  20 tablet  0  . ondansetron (ZOFRAN) 8 MG tablet Take 1 tablet (8 mg total) by mouth 2 (two) times daily. Start day after chemo then two times daily as needed  20 tablet  1  . oxyCODONE-acetaminophen (PERCOCET) 10-325 MG per tablet Take 1 tablet by mouth every 4 (four) hours as needed for pain.  90 tablet  0  . oxyCODONE-acetaminophen (PERCOCET/ROXICET) 5-325 MG per tablet Take 1 tablet by mouth once.  1 tablet  0  . PACLitaxel (TAXOL IV) Inject 1 each into the vein every 21 ( twenty-one) days.      . pantoprazole (PROTONIX) 40 MG tablet Take 40 mg by mouth daily.      . pegfilgrastim (NEULASTA) 6 MG/0.6ML injection Inject 6 mg into the skin every 21 ( twenty-one) days.      . potassium chloride (K-DUR) 10 MEQ tablet Take 2 tablets (20 mEq total) by mouth daily.  14 tablet  0  . PRESCRIPTION MEDICATION every 21 ( twenty-one) days. CHEMOTHERAPY REGIMEN      . prochlorperazine (COMPAZINE) 10 MG tablet  Take 1 tablet (10 mg total) by mouth every 6 (six) hours as needed (Nausea or vomiting).  30 tablet  1  . promethazine (PHENERGAN) 25 MG tablet TAKE 1/2 TABLET BY MOUTH EVERY 6 HOURS PRN FOR NAUSEA.  15 tablet  1   No current facility-administered medications for this visit.      REVIEW OF SYSTEMS: A 10 point review of systems was completed and is negative except as noted above.   PHYSICAL EXAMINATION: BP 115/81  Pulse 102  Temp(Src) 97.6 F (36.4 C) (Oral)  Resp 20  Ht 5\' 11"  (1.803 m)  Wt 403 lb 8 oz (183.026 kg)  BMI 56.30 kg/m2  General: Patient is a  Chronic ill  appearing female in no acute distress HEENT: PERRLA, sclerae anicteric no conjunctival pallor, MMM Neck: supple, no palpable adenopathy Lungs: clear to auscultation bilaterally, no wheezes, rhonchi, or rales Cardiovascular: regular rate rhythm, S1, S2, no murmurs, rubs or gallops Abdomen: Soft, non-tender, non-distended, normoactive bowel sounds, no HSM, limited exam patient morbidly obese, erythema to mid abdomen, no tenderness or warmrth Extremities: warm and well perfused, no clubbing, cyanosis, or edema, strength 5/5 all extremities Skin: , bilateral  breast dependent portions  with erythema and swelling  No warmth, tenderness Neuro: Non-focal ECOG FS:  3 - Symptomatic, >50% confined to bed   LAB RESULTS: Lab Results  Component Value Date   WBC 2.9* 06/19/2013   NEUTROABS 1.5 06/19/2013   HGB 11.3* 06/19/2013   HCT 34.6* 06/19/2013   MCV 98.3 06/19/2013   PLT 143* 06/19/2013      Chemistry      Component Value Date/Time   NA 139 06/19/2013 1046   NA 145 04/24/2013 0824   K 3.2* 06/19/2013 1046   K 3.4* 04/24/2013 0824   CL 104 04/24/2013 0824   CL 98 10/07/2012 0923   CO2 26 06/19/2013 1046   CO2 31 04/24/2013 0824   BUN 5.3* 06/19/2013  1046   BUN 6 04/24/2013 0824   CREATININE 0.7 06/19/2013 1046   CREATININE 0.47* 04/24/2013 0824      Component Value Date/Time   CALCIUM 9.7 06/19/2013 1046   CALCIUM 8.1*  04/24/2013 0824   ALKPHOS 93 06/19/2013 1046   ALKPHOS 179* 03/12/2013 0845   AST 43* 06/19/2013 1046   AST 42* 03/12/2013 0845   ALT 57* 06/19/2013 1046   ALT 53* 03/12/2013 0845   BILITOT 0.47 06/19/2013 1046   BILITOT 0.5 03/12/2013 0845            RADIOGRAPHIC STUDIES: Ir Vertebroplasty Or Sacroplasty 03/02/2013   CLINICAL DATA:  Patient with severely painful compression fracture at T10 and T11. History of endometrial carcinoma. On chemotherapy.  EXAM: VERTEBROPLASTY FL AT T10 and T11.  CORE BIOPSIES OBTAINED  MEDICATIONS: Versed 4 mg. mg IV, Fentanyl 100  mcg IV.  ANESTHESIA/SEDATION: Total Moderate Sedation Time:  35 min.  FLUOROSCOPY TIME:  8 min 48 seconds. Marland Kitchen  PROCEDURE: Following a full explanation of the procedure along with the potentially associated complications, a witnessed informed consent was obtained.  The patient was placed prone on the fluoroscopic table. Nasal oxygen was administered. Physiologic monitoring was performed throughout the duration of the procedure. The skin overlying the thoracic region was prepped and draped in the usual sterile fashion. The T10 and T11 vertebral bodies were identified and the right pedicle at T10, and the left pedicle at T11 were infiltrated with 0.25% Bupivacaine. This was then followed by the advancement of a 13-gauge Cook needle through both the pedicles into the anterior one-third at both levels. Prior to this, 18 gauge core biopsy needles were used to obtain 4 samples of both the levels using a 20 mL syringe. These were sent for pathologic analysis. A gentle contrast injection demonstrated a trabecular pattern of contrast.  At this time, methylmethacrylate mixture was reconstituted. Under biplane intermittent fluoroscopy, the methylmethacrylate was then injected into the T10 and T11 vertebral body with filling of the vertebral body at T10 and T12.  Mild extravasation was noted into the disk spaces through the inferior endplate fracture clefts .  No epidural venous contamination was seen.  The needles were then removed. Hemostasis was achieved at the skin entry site.  There were no acute complications. Patient tolerated the procedure well. The patient was observed for 3 hours and discharged in good condition.  IMPRESSION: Status post vertebral body augmentation for painful compression fracture at T10 and T11 using vertebroplasty technique.  Core biopsies obtained at T10 and T11. Sample sent for pathologic analysis.   Electronically Signed   By: Luanne Bras M.D.   On: 02/26/2013 13:10      ASSESSMENT/PLAN: [name] is a 48 y.o. woman:  #1 Recurrent uterine cancer with peritoneal carcinomatosis and ascites:. Patient  At present  cycle 10 day 7 of Taxol carboplatinum.  She understands that she may continue this for  few more cycles.#2 Ascites: Patient has had multiple paracenteses performed. However with chemotherapy we have not had to do a paracentesis for quite some time. At this time we will continue to follow.  #2 Back pain - Compression fractures at T10 and T11 per MRI on 02/05/2013.  Status post kyphoplasty on 02/26/2013.  Patient with repeat MRI on 12/4 demonstrating compression fractures at L1 and L2.  Patient underwent L1 and L2 kyphoplasty on 04/24/12.  Her pain is  ongoing.Refill for Dilaudid today.  Bone biopsy at the time of kyphoplasty L1 and L2  on 04/24/2013 was not diagnostic Bone biopsy                        Kyphoplasty T10 and T11 02/26/2013 benign. New extensive rib fractures.     CLINICAL DATA: Endometrial cancer. Ongoing chemotherapy. Evaluate  response.  EXAM:  CT CHEST, ABDOMEN, AND PELVIS WITH CONTRAST  TECHNIQUE:  Multidetector CT imaging of the chest, abdomen and pelvis was  performed following the standard protocol during bolus  administration of intravenous contrast.  CONTRAST: 182mL OMNIPAQUE IOHEXOL 300 MG/ML SOLN  COMPARISON: 10/21/2012  FINDINGS:  CT CHEST FINDINGS  Lungs/Pleura: Mild  degradation secondary to patient body habitus. No  nodules or airspace opacities. No pleural fluid.  Heart/Mediastinum: No supraclavicular adenopathy. A left-sided PICC  line which terminates at the right atrium. Normal heart size,  without pericardial effusion. No central pulmonary embolism, on this  non-dedicated study. No mediastinal or hilar adenopathy.  CT ABDOMEN AND PELVIS FINDINGS  Abdomen/Pelvis: Degradation continuing into the abdomen and pelvis.  This is moderate, but less than on the prior exam. Hepatic  steatosis. Hyperenhancing or hyper attenuating focus of the hepatic  dome measures 1.5 cm on image 34 versus 1.7 cm on the prior.  Hepatomegaly, 21.8 cm craniocaudal.  Normal spleen with moderate volume perisplenic ascites. Normal  stomach, pancreas, gallbladder, adrenal glands, kidneys. No  retroperitoneal or retrocrural adenopathy. Grossly normal colon.  Small bowel is grossly normal in caliber. The amount of abdominal  ascites is markedly decreased since the prior exam.  Omental/peritoneal nodules are identified. Example omental nodules  of 1.7 and 1.5 cm on image 74. These areas were not well evaluated  on the prior exam. There may have been omental masses in this area,  but artifact from patient size is extremely limiting on that study.  No gross pelvic adenopathy. Hysterectomy. Grossly normal urinary  bladder. No pelvic fluid.  Edema within the anterior abdominal wall could be secondary to  injection sites.  Bones/Musculoskeletal: Extensive anterior bilateral rib fractures.  These are nonacute but likely new since the prior exam. Interval  vertebral augmentation it L1, T11, and T10.  IMPRESSION:  CT CHEST IMPRESSION  1. No acute process or evidence of metastatic disease in the chest.  2. Decreased but persistent artifact degradation.  3. Nonacute but likely interval bilateral anterior rib fractures.   Etiology of compression fractures spine/rib fractures most  likely metastatic no biopsy proven ,versus , osteoporotic,plasma cell dyscrasia(anemia,high MCV) . Consider monthly Zometa.  Start Calcium 600 mg 2 daily with Vit D 2000 IU one daily .Will check Vit D level    #3History of ascites/liver steatosis with gradually increasing liver enzymes consider restaging patient not symptomatic.  #4  Right upper extremity DVT: Diagnosed 04/13/13.  Right port removal and  Lovenox 150 mg sq bid       Continue..  #5 right breast swelling: Patient has had a mammogram performed which did not reveal any evidence of masses. I do think this may be secondary to the right upper extremity edema and thrombosis. We will continue to monitor this .Fungal skin rash apply nystatin ointment and powder,keep area dry well aerated.  #6 PICC line: Patient has a PICC line in her left upper extremity.  #8 followup: Patient will be seen back in 2 weeks for followup and blood work as well as chemotherapy cycle#11.  The length of time of the face-to-face encounter was 25    minutes. More than 50% of time was  spent counseling and coordination of care.   Marcy Panning, MD Medical/Oncology [facility] [phone] (beeper) [phone] (Office)  06/24/2013, 6:30 PM

## 2013-06-25 ENCOUNTER — Other Ambulatory Visit: Payer: Self-pay | Admitting: Hematology and Oncology

## 2013-06-25 ENCOUNTER — Other Ambulatory Visit: Payer: Self-pay | Admitting: Oncology

## 2013-06-30 ENCOUNTER — Other Ambulatory Visit: Payer: Self-pay | Admitting: Adult Health

## 2013-07-01 ENCOUNTER — Other Ambulatory Visit: Payer: Self-pay

## 2013-07-01 DIAGNOSIS — C541 Malignant neoplasm of endometrium: Secondary | ICD-10-CM

## 2013-07-02 ENCOUNTER — Other Ambulatory Visit: Payer: Self-pay | Admitting: *Deleted

## 2013-07-02 ENCOUNTER — Other Ambulatory Visit (HOSPITAL_BASED_OUTPATIENT_CLINIC_OR_DEPARTMENT_OTHER): Payer: BC Managed Care – PPO

## 2013-07-02 ENCOUNTER — Encounter: Payer: Self-pay | Admitting: Oncology

## 2013-07-02 ENCOUNTER — Other Ambulatory Visit: Payer: Self-pay

## 2013-07-02 ENCOUNTER — Telehealth: Payer: Self-pay | Admitting: Oncology

## 2013-07-02 ENCOUNTER — Telehealth: Payer: Self-pay | Admitting: *Deleted

## 2013-07-02 ENCOUNTER — Ambulatory Visit (HOSPITAL_BASED_OUTPATIENT_CLINIC_OR_DEPARTMENT_OTHER): Payer: BC Managed Care – PPO | Admitting: Oncology

## 2013-07-02 ENCOUNTER — Ambulatory Visit (HOSPITAL_BASED_OUTPATIENT_CLINIC_OR_DEPARTMENT_OTHER): Payer: BC Managed Care – PPO

## 2013-07-02 VITALS — BP 109/60 | HR 85 | Temp 97.7°F | Resp 20 | Ht 71.0 in | Wt >= 6400 oz

## 2013-07-02 DIAGNOSIS — C541 Malignant neoplasm of endometrium: Secondary | ICD-10-CM

## 2013-07-02 DIAGNOSIS — C549 Malignant neoplasm of corpus uteri, unspecified: Secondary | ICD-10-CM

## 2013-07-02 DIAGNOSIS — Z5111 Encounter for antineoplastic chemotherapy: Secondary | ICD-10-CM

## 2013-07-02 DIAGNOSIS — S32020A Wedge compression fracture of second lumbar vertebra, initial encounter for closed fracture: Secondary | ICD-10-CM

## 2013-07-02 DIAGNOSIS — S32010A Wedge compression fracture of first lumbar vertebra, initial encounter for closed fracture: Secondary | ICD-10-CM

## 2013-07-02 DIAGNOSIS — M549 Dorsalgia, unspecified: Secondary | ICD-10-CM

## 2013-07-02 DIAGNOSIS — C569 Malignant neoplasm of unspecified ovary: Secondary | ICD-10-CM

## 2013-07-02 DIAGNOSIS — Z86718 Personal history of other venous thrombosis and embolism: Secondary | ICD-10-CM

## 2013-07-02 DIAGNOSIS — C786 Secondary malignant neoplasm of retroperitoneum and peritoneum: Secondary | ICD-10-CM

## 2013-07-02 DIAGNOSIS — R188 Other ascites: Secondary | ICD-10-CM

## 2013-07-02 DIAGNOSIS — N63 Unspecified lump in unspecified breast: Secondary | ICD-10-CM

## 2013-07-02 LAB — CBC WITH DIFFERENTIAL/PLATELET
BASO%: 0.4 % (ref 0.0–2.0)
Basophils Absolute: 0 10*3/uL (ref 0.0–0.1)
EOS%: 0.6 % (ref 0.0–7.0)
Eosinophils Absolute: 0 10*3/uL (ref 0.0–0.5)
HEMATOCRIT: 33.5 % — AB (ref 34.8–46.6)
HGB: 10.6 g/dL — ABNORMAL LOW (ref 11.6–15.9)
LYMPH#: 1.1 10*3/uL (ref 0.9–3.3)
LYMPH%: 22.2 % (ref 14.0–49.7)
MCH: 32 pg (ref 25.1–34.0)
MCHC: 31.6 g/dL (ref 31.5–36.0)
MCV: 101.2 fL — ABNORMAL HIGH (ref 79.5–101.0)
MONO#: 0.5 10*3/uL (ref 0.1–0.9)
MONO%: 11 % (ref 0.0–14.0)
NEUT#: 3.2 10*3/uL (ref 1.5–6.5)
NEUT%: 65.8 % (ref 38.4–76.8)
NRBC: 0 % (ref 0–0)
Platelets: 212 10*3/uL (ref 145–400)
RBC: 3.31 10*6/uL — AB (ref 3.70–5.45)
RDW: 18.5 % — ABNORMAL HIGH (ref 11.2–14.5)
WBC: 4.8 10*3/uL (ref 3.9–10.3)

## 2013-07-02 LAB — COMPREHENSIVE METABOLIC PANEL (CC13)
ALT: 42 U/L (ref 0–55)
AST: 33 U/L (ref 5–34)
Albumin: 3.2 g/dL — ABNORMAL LOW (ref 3.5–5.0)
Alkaline Phosphatase: 69 U/L (ref 40–150)
Anion Gap: 14 mEq/L — ABNORMAL HIGH (ref 3–11)
BUN: 7.9 mg/dL (ref 7.0–26.0)
CO2: 26 mEq/L (ref 22–29)
CREATININE: 0.6 mg/dL (ref 0.6–1.1)
Calcium: 9.7 mg/dL (ref 8.4–10.4)
Chloride: 103 mEq/L (ref 98–109)
Glucose: 115 mg/dl (ref 70–140)
Potassium: 3.4 mEq/L — ABNORMAL LOW (ref 3.5–5.1)
Sodium: 143 mEq/L (ref 136–145)
Total Bilirubin: 0.39 mg/dL (ref 0.20–1.20)
Total Protein: 6.7 g/dL (ref 6.4–8.3)

## 2013-07-02 MED ORDER — DIPHENHYDRAMINE HCL 50 MG/ML IJ SOLN
INTRAMUSCULAR | Status: AC
  Filled 2013-07-02: qty 1

## 2013-07-02 MED ORDER — FAMOTIDINE IN NACL 20-0.9 MG/50ML-% IV SOLN
INTRAVENOUS | Status: AC
  Filled 2013-07-02: qty 50

## 2013-07-02 MED ORDER — PALONOSETRON HCL INJECTION 0.25 MG/5ML
INTRAVENOUS | Status: AC
  Filled 2013-07-02: qty 5

## 2013-07-02 MED ORDER — SODIUM CHLORIDE 0.9 % IV SOLN
Freq: Once | INTRAVENOUS | Status: AC
  Administered 2013-07-02: 10:00:00 via INTRAVENOUS

## 2013-07-02 MED ORDER — SODIUM CHLORIDE 0.9 % IV SOLN
900.0000 mg | Freq: Once | INTRAVENOUS | Status: AC
  Administered 2013-07-02: 900 mg via INTRAVENOUS
  Filled 2013-07-02: qty 90

## 2013-07-02 MED ORDER — DIPHENHYDRAMINE HCL 50 MG/ML IJ SOLN
50.0000 mg | Freq: Once | INTRAMUSCULAR | Status: AC
  Administered 2013-07-02: 50 mg via INTRAVENOUS

## 2013-07-02 MED ORDER — HEPARIN SOD (PORK) LOCK FLUSH 100 UNIT/ML IV SOLN
500.0000 [IU] | Freq: Once | INTRAVENOUS | Status: DC | PRN
  Filled 2013-07-02: qty 5

## 2013-07-02 MED ORDER — SODIUM CHLORIDE 0.9 % IV SOLN
150.0000 mg | Freq: Once | INTRAVENOUS | Status: AC
  Administered 2013-07-02: 150 mg via INTRAVENOUS
  Filled 2013-07-02: qty 5

## 2013-07-02 MED ORDER — OXYCODONE-ACETAMINOPHEN 5-325 MG PO TABS
ORAL_TABLET | ORAL | Status: AC
  Filled 2013-07-02: qty 2

## 2013-07-02 MED ORDER — OXYCODONE-ACETAMINOPHEN 5-325 MG PO TABS
2.0000 | ORAL_TABLET | Freq: Once | ORAL | Status: AC
Start: 2013-07-02 — End: 2013-07-02
  Administered 2013-07-02: 2 via ORAL

## 2013-07-02 MED ORDER — PACLITAXEL CHEMO INJECTION 300 MG/50ML
175.0000 mg/m2 | Freq: Once | INTRAVENOUS | Status: AC
  Administered 2013-07-02: 618 mg via INTRAVENOUS
  Filled 2013-07-02: qty 103

## 2013-07-02 MED ORDER — DEXAMETHASONE SODIUM PHOSPHATE 20 MG/5ML IJ SOLN
INTRAMUSCULAR | Status: AC
  Filled 2013-07-02: qty 5

## 2013-07-02 MED ORDER — PALONOSETRON HCL INJECTION 0.25 MG/5ML
0.2500 mg | Freq: Once | INTRAVENOUS | Status: AC
  Administered 2013-07-02: 0.25 mg via INTRAVENOUS

## 2013-07-02 MED ORDER — CARBOPLATIN CHEMO INTRADERMAL TEST DOSE 100MCG/0.02ML
100.0000 ug | Freq: Once | INTRADERMAL | Status: AC
  Administered 2013-07-02: 100 ug via INTRADERMAL
  Filled 2013-07-02: qty 0.01

## 2013-07-02 MED ORDER — HEPARIN SOD (PORK) LOCK FLUSH 100 UNIT/ML IV SOLN
250.0000 [IU] | Freq: Once | INTRAVENOUS | Status: AC | PRN
  Administered 2013-07-02: 250 [IU]
  Filled 2013-07-02: qty 5

## 2013-07-02 MED ORDER — DEXAMETHASONE SODIUM PHOSPHATE 20 MG/5ML IJ SOLN
12.0000 mg | Freq: Once | INTRAMUSCULAR | Status: AC
  Administered 2013-07-02: 12 mg via INTRAVENOUS

## 2013-07-02 MED ORDER — SODIUM CHLORIDE 0.9 % IJ SOLN
10.0000 mL | INTRAMUSCULAR | Status: DC | PRN
  Administered 2013-07-02: 10 mL
  Filled 2013-07-02: qty 10

## 2013-07-02 MED ORDER — FAMOTIDINE IN NACL 20-0.9 MG/50ML-% IV SOLN
20.0000 mg | Freq: Once | INTRAVENOUS | Status: AC
  Administered 2013-07-02: 20 mg via INTRAVENOUS

## 2013-07-02 NOTE — Patient Instructions (Signed)
Proceed with cycle 11 of taxol/carboplatin  We will see you back in 1 week

## 2013-07-02 NOTE — Progress Notes (Signed)
Infusion -Loren/RN called - pt c/o pain in legs 8/10.  Takes Oxy10 at home - not with her.  Per Dr. Humphrey Rolls - ok to give pt one dose of Percocet 10-325.  Remind pt to bring her home pain meds with her to treatment in the future.

## 2013-07-02 NOTE — Telephone Encounter (Signed)
Per staff message and POF I have scheduled appts.  JMW  

## 2013-07-02 NOTE — Patient Instructions (Signed)
[facility] Discharge Instructions for Patients Receiving Chemotherapy  Today you received the following chemotherapy agents taxol/carboplatin  To help prevent nausea and vomiting after your treatment, we encourage you to take your nausea medication as directed   If you develop nausea and vomiting that is not controlled by your nausea medication, call the clinic.   BELOW ARE SYMPTOMS THAT SHOULD BE REPORTED IMMEDIATELY:  *FEVER GREATER THAN 100.5 F  *CHILLS WITH OR WITHOUT FEVER  NAUSEA AND VOMITING THAT IS NOT CONTROLLED WITH YOUR NAUSEA MEDICATION  *UNUSUAL SHORTNESS OF BREATH  *UNUSUAL BRUISING OR BLEEDING  TENDERNESS IN MOUTH AND THROAT WITH OR WITHOUT PRESENCE OF ULCERS  *URINARY PROBLEMS  *BOWEL PROBLEMS  UNUSUAL RASH Items with * indicate a potential emergency and should be followed up as soon as possible.  Feel free to call the clinic you have any questions or concerns. The clinic phone number is [phone].  

## 2013-07-02 NOTE — Telephone Encounter (Signed)
, °

## 2013-07-02 NOTE — Progress Notes (Signed)
[place]  Telephone:([phone]) [phone] Fax:(336) [phone]  OFFICE PROGRESS NOTE    PATIENT: [name]   DOB: 1965-04-27  MR#: [id number]  TIW#:[id number]   SN:[id number], [name], MD GYN ONC: Janie Morning, M.D. SU:  Donnie Mesa, M.D.    DIAGNOSIS: [name] is a 48 y.o. female with a prior history of stage IA uterine cancer diagnosed in 2010.  Now with recurrent disease diagnosed in 08/2012.  PRIOR THERAPY: #1  Patient developed excessive uterine bleeding.  She underwent dilatation and curettage with hysteroscopy. 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 Due to morbid obesity Mirena IUD was placed along with Aygestin. Patient had a dramatic directed weight loss with nutritional support she went from 523 pounds to 359 pounds in 10/2009.  On 12/27/2009 she underwent a robotic-assisted laparoscopic hysterectomy, bilateral salpingo-oophorectomy, and mini laparotomy through the umbilical port with morcellation of uterus within about the delivery of the uterus. The final pathology revealed an endometrial adenocarcinoma grade 2 with invasion limited to 1 mm of the myometrium.  #2  Patient continued to do well until 09/2012 when she developed right upper quadrant pain. It was presumed to be cholelithiasis. 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 Omental biopsies were obtained. The pathology was consistent with metastatic adenocarcinoma with the presumption of recurrent endometrial carcinoma.  She was seen by Dr. Janie Morning on 10/07/2012.  Dr. Skeet Latch recommended that Ms. Giaimo undergo chemotherapy consisting of Taxol and Carboplatinum.  Chemotherapy started on 11/04/2012.   #3 Abdominal ascites with multiple paracenteses.  #4 Back pain and subsequent MRI on 02/05/2013 revealed compression fractures of T10 and T11.    #5 Status post kyphoplasty with Dr. Luanne Bras on 02/26/2013.  Patient states her back pain continues.  #6 Patient hospitalized and underwent more MRI's on 12/4 and L1 and L2 compression fractures were noted, after consultation with Dr. Sherwood Gambler, the patient was referred back to interventional radiology for evaluation for kyphoplasty and this was performed on 04/24/13.    #7 Patient diagnosed with right upper extremity DVT on 04/13/13.  She was initially admitted to the hospital and started on a heparin gtt to remove her port.  She was then transitioned to Enoxaparin BID.     CURRENT THERAPY:    Proceed with Cycle 11 day 1 of Taxol carboplatinum today 07/02/13  INTERVAL HISTORY: Patient is seen in followup today to begin cycle 11 of Taxol carboplatinum. They understand the rationale for this. We discussed risks and benefits of that. Clinically she seems to be doing well and is without any significant problems. She has noticed that she is losing her hair now. She denies any pain. Her pain is really well controlled. Her right upper extremity looks much better. She denies any fevers chills night sweats headaches she has no shortness of breath chest pains or palpitations. She has not had any recurrence of her rashes. Dry skin improved.  She has no bleeding problems. Her bowels and bladder is working well. Remainder of the 10 point systems is negative.  PAST MEDICAL HISTORY: Past Medical History  Diagnosis Date  . Obesity   . DVT (deep venous thrombosis) 2009    RLE, tx for ~6 months  . PONV (postoperative nausea and vomiting)   . Swelling     BOTH LEGS  . Rash     LOWER ABD  . Gallstones   .  Ascites   . Uterine cancer     Dr. Humphrey Rolls, chemotherapy  . Family history of anesthesia complication     MOTHER IS DIFFICULT TO WAKE  . Gallstones   . Cellulitis of leg, right     PAST SURGICAL HISTORY: Past Surgical History  Procedure Laterality Date  . Ankle surgery  1990  . Wisdom tooth  extraction    . Tonsillectomy and adenoidectomy    . Abdominal hysterectomy  12/2009    [facility], [facility]  . Laparoscopy N/A 10/02/2012    Procedure: LAPAROSCOPY DIAGNOSTIC, perocentisis, omental biopsy;  Surgeon: Imogene Burn. Tsuei, MD;  Location: [facility];  Service: General;  Laterality: N/A;  removed a total of 15,000 of acities  . Paracentesis    . Kyphoplasty  02/26/2013    FAMILY HISTORY: Family History  Problem Relation Age of Onset  . Heart disease Father   . Diabetes Brother   . Heart disease Mother   . Cirrhosis Father   . Diabetes Father   . Thyroid disease Mother   . Diabetes Mother   . Kidney disease Brother     from diabetes  . Breast cancer Maternal Aunt   . Breast cancer Maternal Aunt   . Melanoma Maternal Aunt   . Melanoma Maternal Aunt   . Other Maternal Aunt     lupus anticardiolipin ab syndrome    SOCIAL HISTORY: History  Substance Use Topics  . Smoking status: Never Smoker   . Smokeless tobacco: Never Used  . Alcohol Use: Yes     Comment: social, Occassionally    ALLERGIES: No Known Allergies   MEDICATIONS:  Current Outpatient Prescriptions  Medication Sig Dispense Refill  . Ascorbic Acid (VITAMIN C) 1000 MG tablet Take 1,000 mg by mouth daily.      Marland Kitchen b complex vitamins capsule Take 1 capsule by mouth daily.      Marland Kitchen CARAFATE 1 GM/10ML suspension TAKE 10 MLS BY MOUTH 4 TIMES A DAY  420 mL  2  . Cyanocobalamin (VITAMIN B-12 PO) Take 2,500 mg by mouth daily.       . cyclobenzaprine (FLEXERIL) 5 MG tablet TAKE 1 TABLET BY MOUTH 3 TIMES A DAY AS NEEDED FOR MUSCLE SPASMS  60 tablet  1  . enoxaparin (LOVENOX) 150 MG/ML injection INJECT 1 SYRINGE EVERY 12 HOURS  60 mL  0  . ferrous sulfate 325 (65 FE) MG tablet Take 1 tablet (325 mg total) by mouth 3 (three) times daily with meals.  90 tablet  2  . folic acid (FOLVITE) 409 MCG tablet Take 400 mcg by mouth daily.        . furosemide (LASIX) 20 MG tablet Take 1 tablet (20 mg total) by mouth every other day.  30 tablet   0  . loratadine (CLARITIN) 10 MG tablet Take 10 mg by mouth daily.      . magnesium gluconate (MAGONATE) 500 MG tablet Take 500 mg by mouth daily.      . miconazole nitrate (MICATIN) POWD Apply 1 application topically 4 (four) times daily.  10 g  2  . Multiple Vitamins-Minerals (MULTIVITAMIN WITH MINERALS) tablet Take 1 tablet by mouth daily.       Marland Kitchen nystatin ointment (MYCOSTATIN) Apply 1 application topically 2 (two) times daily.      . ondansetron (ZOFRAN ODT) 8 MG disintegrating tablet Take 1 tablet (8 mg total) by mouth every 8 (eight) hours as needed for nausea or vomiting.  20 tablet  0  .  ondansetron (ZOFRAN) 8 MG tablet TAKE 1 TABLET BY MOUTH TWICE A DAY AS NEEDED START DAY AFTER CHEMO  20 tablet  1  . oxyCODONE-acetaminophen (PERCOCET) 10-325 MG per tablet Take 1 tablet by mouth every 4 (four) hours as needed for pain.  90 tablet  0  . PACLitaxel (TAXOL IV) Inject 1 each into the vein every 21 ( twenty-one) days.      . pantoprazole (PROTONIX) 40 MG tablet Take 40 mg by mouth daily.      . pegfilgrastim (NEULASTA) 6 MG/0.6ML injection Inject 6 mg into the skin every 21 ( twenty-one) days.      . potassium chloride (K-DUR) 10 MEQ tablet Take 2 tablets (20 mEq total) by mouth daily.  14 tablet  0  . PRESCRIPTION MEDICATION every 21 ( twenty-one) days. CHEMOTHERAPY REGIMEN      . prochlorperazine (COMPAZINE) 10 MG tablet TAKE 1 TABLET BY MOUTH EVERY 6 HOURS AS NEEDED FOR NAUSEA AND VOMITING  30 tablet  1  . promethazine (PHENERGAN) 25 MG tablet TAKE 1/2 TABLET BY MOUTH EVERY 6 HOURS PRN FOR NAUSEA.  15 tablet  1  . oxyCODONE-acetaminophen (PERCOCET/ROXICET) 5-325 MG per tablet Take 1 tablet by mouth once.  1 tablet  0   No current facility-administered medications for this visit.      REVIEW OF SYSTEMS: A 10 point review of systems was completed and is negative except as noted above.   PHYSICAL EXAMINATION: BP 109/60  Pulse 85  Temp(Src) 97.7 F (36.5 C) (Oral)  Resp 20  Ht 5'  11" (1.803 m)  Wt 412 lb 8 oz (187.109 kg)  BMI 57.56 kg/m2  General: Patient is a well appearing female in no acute distress HEENT: PERRLA, sclerae anicteric no conjunctival pallor, MMM Neck: supple, no palpable adenopathy Lungs: clear to auscultation bilaterally, no wheezes, rhonchi, or rales Cardiovascular: regular rate rhythm, S1, S2, no murmurs, rubs or gallops Abdomen: Soft, non-tender, non-distended, normoactive bowel sounds, no HSM, limited exam patient morbidly obese, erythema to mid abdomen, no tenderness or warmrth Extremities: warm and well perfused, no clubbing, cyanosis, or edema, strength 5/5 all extremities Skin: no rashes or lesions, right breast with erythema and swelling, left hand with plaque like erythema on fingertips and fingers up to knuckles of all 5 digits.  No warmth, tenderness Neuro: Non-focal ECOG FS:  3 - Symptomatic, >50% confined to bed   LAB RESULTS: Lab Results  Component Value Date   WBC 4.8 07/02/2013   NEUTROABS 3.2 07/02/2013   HGB 10.6* 07/02/2013   HCT 33.5* 07/02/2013   MCV 101.2* 07/02/2013   PLT 212 07/02/2013      Chemistry      Component Value Date/Time   NA 139 06/19/2013 1046   NA 145 04/24/2013 0824   K 3.2* 06/19/2013 1046   K 3.4* 04/24/2013 0824   CL 104 04/24/2013 0824   CL 98 10/07/2012 0923   CO2 26 06/19/2013 1046   CO2 31 04/24/2013 0824   BUN 5.3* 06/19/2013 1046   BUN 6 04/24/2013 0824   CREATININE 0.7 06/19/2013 1046   CREATININE 0.47* 04/24/2013 0824      Component Value Date/Time   CALCIUM 9.7 06/19/2013 1046   CALCIUM 8.1* 04/24/2013 0824   ALKPHOS 93 06/19/2013 1046   ALKPHOS 179* 03/12/2013 0845   AST 43* 06/19/2013 1046   AST 42* 03/12/2013 0845   ALT 57* 06/19/2013 1046   ALT 53* 03/12/2013 0845   BILITOT 0.47 06/19/2013 1046  BILITOT 0.5 03/12/2013 0845       No results found for this basename: [id number]    No components found with this basename: [id number]     RADIOGRAPHIC STUDIES: Ir Vertebroplasty Or  Sacroplasty 03/02/2013   CLINICAL DATA:  Patient with severely painful compression fracture at T10 and T11. History of endometrial carcinoma. On chemotherapy.  EXAM: VERTEBROPLASTY FL AT T10 and T11.  CORE BIOPSIES OBTAINED  MEDICATIONS: Versed 4 mg. mg IV, Fentanyl 100  mcg IV.  ANESTHESIA/SEDATION: Total Moderate Sedation Time:  35 min.  FLUOROSCOPY TIME:  8 min 48 seconds. Marland Kitchen  PROCEDURE: Following a full explanation of the procedure along with the potentially associated complications, a witnessed informed consent was obtained.  The patient was placed prone on the fluoroscopic table. Nasal oxygen was administered. Physiologic monitoring was performed throughout the duration of the procedure. The skin overlying the thoracic region was prepped and draped in the usual sterile fashion. The T10 and T11 vertebral bodies were identified and the right pedicle at T10, and the left pedicle at T11 were infiltrated with 0.25% Bupivacaine. This was then followed by the advancement of a 13-gauge Cook needle through both the pedicles into the anterior one-third at both levels. Prior to this, 18 gauge core biopsy needles were used to obtain 4 samples of both the levels using a 20 mL syringe. These were sent for pathologic analysis. A gentle contrast injection demonstrated a trabecular pattern of contrast.  At this time, methylmethacrylate mixture was reconstituted. Under biplane intermittent fluoroscopy, the methylmethacrylate was then injected into the T10 and T11 vertebral body with filling of the vertebral body at T10 and T12.  Mild extravasation was noted into the disk spaces through the inferior endplate fracture clefts . No epidural venous contamination was seen.  The needles were then removed. Hemostasis was achieved at the skin entry site.  There were no acute complications. Patient tolerated the procedure well. The patient was observed for 3 hours and discharged in good condition.  IMPRESSION: Status post vertebral body  augmentation for painful compression fracture at T10 and T11 using vertebroplasty technique.  Core biopsies obtained at T10 and T11. Sample sent for pathologic analysis.   Electronically Signed   By: Luanne Bras M.D.   On: 02/26/2013 13:10      ASSESSMENT/PLAN: BAREERAH AMRHEIN is a 48 y.o. woman:  #1 Recurrent uterine cancer with peritoneal carcinomatosis and ascites:. Patient will proceed with cycle 11 day 1 of Taxol carboplatinum. Risks benefits and side effects of treatment were discussed. She understands that she may continue this for a few more cycles. She understands that she may get another test dose for the carboplatinum. She knows that she is at risk for developing allergic reactions. She consents to proceeding with her therapy.  #2 Ascites: Patient has had multiple paracenteses performed. However with chemotherapy we have not had to do a paracentesis for quite some time. At this time we will continue to follow.  #3 Back pain - Compression fractures at T10 and T11 per MRI on 02/05/2013.  Status post kyphoplasty on 02/26/2013.  Patient with repeat MRI on 12/4 demonstrating compression fractures at L1 and L2.  Patient underwent L1 and L2 kyphoplasty on 04/24/12.  Her pain is much improved.   #4  Right upper extremity DVT: Diagnosed 04/13/13.  She was started on a Heparin gtt and admitted to the hospital where she underwent right port removal and transitioned to Lovenox BID.  She is tolerating the Lovenox well.  For now we will continue Lovenox.  #5 right breast swelling: improved significantly  #6 rash on left hand: resolved  #7 PICC line: Patient has a PICC line in her left upper extremity. Looks clean, no signs of infections  #8 followup: Patient will be seen back in one week for followup and blood work as well as chemotherapy toxicity assessment.  The length of time of the face-to-face encounter was 25    minutes. More than 50% of time was spent counseling and coordination of  care.   Marcy Panning, MD Medical/Oncology [facility] [phone] (beeper) [phone] (Office)  07/02/2013, 8:42 AM

## 2013-07-03 ENCOUNTER — Ambulatory Visit (HOSPITAL_BASED_OUTPATIENT_CLINIC_OR_DEPARTMENT_OTHER): Payer: BC Managed Care – PPO

## 2013-07-03 VITALS — BP 104/63 | HR 93 | Temp 97.5°F

## 2013-07-03 DIAGNOSIS — Z5189 Encounter for other specified aftercare: Secondary | ICD-10-CM

## 2013-07-03 DIAGNOSIS — C549 Malignant neoplasm of corpus uteri, unspecified: Secondary | ICD-10-CM

## 2013-07-03 DIAGNOSIS — C541 Malignant neoplasm of endometrium: Secondary | ICD-10-CM

## 2013-07-03 LAB — VITAMIN D 25 HYDROXY (VIT D DEFICIENCY, FRACTURES): VIT D 25 HYDROXY: 20 ng/mL — AB (ref 30–89)

## 2013-07-03 MED ORDER — PEGFILGRASTIM INJECTION 6 MG/0.6ML
6.0000 mg | Freq: Once | SUBCUTANEOUS | Status: AC
  Administered 2013-07-03: 6 mg via SUBCUTANEOUS
  Filled 2013-07-03: qty 0.6

## 2013-07-06 ENCOUNTER — Telehealth: Payer: Self-pay

## 2013-07-06 ENCOUNTER — Other Ambulatory Visit: Payer: Self-pay

## 2013-07-06 ENCOUNTER — Other Ambulatory Visit: Payer: Self-pay | Admitting: Hematology and Oncology

## 2013-07-06 DIAGNOSIS — C801 Malignant (primary) neoplasm, unspecified: Secondary | ICD-10-CM

## 2013-07-06 DIAGNOSIS — E559 Vitamin D deficiency, unspecified: Secondary | ICD-10-CM

## 2013-07-06 DIAGNOSIS — C541 Malignant neoplasm of endometrium: Secondary | ICD-10-CM

## 2013-07-06 MED ORDER — ERGOCALCIFEROL 1.25 MG (50000 UT) PO CAPS: 50000.0000 [IU] | ORAL_CAPSULE | ORAL | Status: DC

## 2013-07-06 MED ORDER — NORMAL SALINE FLUSH 0.9 % IV SOLN: 10.0000 mL | INTRAVENOUS | Status: DC

## 2013-07-06 NOTE — Telephone Encounter (Signed)
Call from [facility] - Pt needs order for weekly PICC dressing/cap changes at clinic - pof entered.  Pt needs scrip for NS flushes - sent to [facility] outpatient pharmacy.  Per KK - port eventually but not at this time.  Verbal order to [facility] for 1 drsg/cap change on 3/19 [facility] will discharge pt at this time.   Pt notified and voiced understanding.

## 2013-07-06 NOTE — Telephone Encounter (Signed)
Advised patient per Dr Owens Loffler that her Vitamin D level is low and that a prescription strength vitamin D has been sent in to take once a week for 4 weeks.  Patient verbalized understanding.  Will call back with any questions or concerns.

## 2013-07-07 ENCOUNTER — Telehealth: Payer: Self-pay | Admitting: Oncology

## 2013-07-07 NOTE — Telephone Encounter (Signed)
, °

## 2013-07-09 ENCOUNTER — Other Ambulatory Visit: Payer: Self-pay

## 2013-07-09 ENCOUNTER — Telehealth: Payer: Self-pay

## 2013-07-09 ENCOUNTER — Encounter: Payer: Self-pay | Admitting: Oncology

## 2013-07-09 ENCOUNTER — Ambulatory Visit (HOSPITAL_BASED_OUTPATIENT_CLINIC_OR_DEPARTMENT_OTHER): Payer: BC Managed Care – PPO | Admitting: Oncology

## 2013-07-09 ENCOUNTER — Other Ambulatory Visit (HOSPITAL_BASED_OUTPATIENT_CLINIC_OR_DEPARTMENT_OTHER): Payer: BC Managed Care – PPO

## 2013-07-09 ENCOUNTER — Telehealth: Payer: Self-pay | Admitting: *Deleted

## 2013-07-09 VITALS — BP 109/78 | HR 102 | Temp 98.0°F | Resp 20 | Ht 71.0 in | Wt 397.3 lb

## 2013-07-09 DIAGNOSIS — D6481 Anemia due to antineoplastic chemotherapy: Secondary | ICD-10-CM

## 2013-07-09 DIAGNOSIS — C801 Malignant (primary) neoplasm, unspecified: Secondary | ICD-10-CM

## 2013-07-09 DIAGNOSIS — I82409 Acute embolism and thrombosis of unspecified deep veins of unspecified lower extremity: Secondary | ICD-10-CM

## 2013-07-09 DIAGNOSIS — D6959 Other secondary thrombocytopenia: Secondary | ICD-10-CM

## 2013-07-09 DIAGNOSIS — M549 Dorsalgia, unspecified: Secondary | ICD-10-CM

## 2013-07-09 DIAGNOSIS — T451X5A Adverse effect of antineoplastic and immunosuppressive drugs, initial encounter: Secondary | ICD-10-CM

## 2013-07-09 DIAGNOSIS — C786 Secondary malignant neoplasm of retroperitoneum and peritoneum: Secondary | ICD-10-CM

## 2013-07-09 DIAGNOSIS — C541 Malignant neoplasm of endometrium: Secondary | ICD-10-CM

## 2013-07-09 DIAGNOSIS — C549 Malignant neoplasm of corpus uteri, unspecified: Secondary | ICD-10-CM

## 2013-07-09 DIAGNOSIS — R188 Other ascites: Secondary | ICD-10-CM

## 2013-07-09 LAB — COMPREHENSIVE METABOLIC PANEL (CC13)
ALBUMIN: 3.6 g/dL (ref 3.5–5.0)
ALK PHOS: 95 U/L (ref 40–150)
ALT: 56 U/L — ABNORMAL HIGH (ref 0–55)
AST: 40 U/L — AB (ref 5–34)
Anion Gap: 13 mEq/L — ABNORMAL HIGH (ref 3–11)
BUN: 7.6 mg/dL (ref 7.0–26.0)
CO2: 30 mEq/L — ABNORMAL HIGH (ref 22–29)
CREATININE: 0.7 mg/dL (ref 0.6–1.1)
Calcium: 9.7 mg/dL (ref 8.4–10.4)
Chloride: 97 mEq/L — ABNORMAL LOW (ref 98–109)
GLUCOSE: 127 mg/dL (ref 70–140)
Potassium: 3.4 mEq/L — ABNORMAL LOW (ref 3.5–5.1)
Sodium: 140 mEq/L (ref 136–145)
Total Bilirubin: 0.55 mg/dL (ref 0.20–1.20)
Total Protein: 7.3 g/dL (ref 6.4–8.3)

## 2013-07-09 LAB — CBC WITH DIFFERENTIAL/PLATELET
BASO%: 0.7 % (ref 0.0–2.0)
BASOS ABS: 0 10*3/uL (ref 0.0–0.1)
EOS ABS: 0.1 10*3/uL (ref 0.0–0.5)
EOS%: 2.3 % (ref 0.0–7.0)
HEMATOCRIT: 32.4 % — AB (ref 34.8–46.6)
HEMOGLOBIN: 11 g/dL — AB (ref 11.6–15.9)
LYMPH%: 20.5 % (ref 14.0–49.7)
MCH: 33.3 pg (ref 25.1–34.0)
MCHC: 33.9 g/dL (ref 31.5–36.0)
MCV: 98 fL (ref 79.5–101.0)
MONO#: 0.2 10*3/uL (ref 0.1–0.9)
MONO%: 5.3 % (ref 0.0–14.0)
NEUT%: 71.2 % (ref 38.4–76.8)
NEUTROS ABS: 3.2 10*3/uL (ref 1.5–6.5)
PLATELETS: 140 10*3/uL — AB (ref 145–400)
RBC: 3.31 10*6/uL — ABNORMAL LOW (ref 3.70–5.45)
RDW: 18.5 % — ABNORMAL HIGH (ref 11.2–14.5)
WBC: 4.4 10*3/uL (ref 3.9–10.3)
lymph#: 0.9 10*3/uL (ref 0.9–3.3)

## 2013-07-09 MED ORDER — HEPARIN LOCK FLUSH 100 UNIT/ML IV SOLN: INTRAVENOUS | Status: DC

## 2013-07-09 NOTE — Patient Instructions (Signed)
Doing well.  Platelets are slightly low but there is no need to worry. This is likely due to the chemotherapy. We will check this again prior to your next chemotherapy.  You'll be seen by on 07/24/2013 4 cycles 12 of chemotherapy  After that we will plan on setting you 04 CT scans to evaluate response to therapy. He will also need followup with Dr. Skeet Latch

## 2013-07-09 NOTE — Telephone Encounter (Signed)
Confirmed order for [name] - pt line should be flushed with 250 units heparin.  [name] stated he will call patient and clarify directions to patient.

## 2013-07-09 NOTE — Telephone Encounter (Signed)
Message copied by Prentiss Bells on Thu Jul 09, 2013 11:55 AM ------      Message from: [name]      Created: Thu Jul 09, 2013  9:19 AM       Please let patient to increase Potassium intake through foods ------

## 2013-07-09 NOTE — Telephone Encounter (Signed)
Per KK note - Advised pt to increase potassium intake through foods.  Mailed list of K= ruch foods to pt.  Pt voiced understanding.

## 2013-07-09 NOTE — Progress Notes (Signed)
[place]  [phone]) [phone] [phone]  OFFICE PROGRESS NOTE    PATIENT: [name]   DOB: 1965-10-04  MR#: [id number]  OXB#:[id number]   AS:[id number], [name], MD GYN ONC: Janie Morning, M.D. SU:  Donnie Mesa, M.D.    DIAGNOSIS: [name] is a 48 y.o. female with a prior history of stage IA uterine cancer diagnosed in 2010.  Now with recurrent disease diagnosed in 08/2012.  PRIOR THERAPY: #1  Patient developed excessive uterine bleeding.  She underwent dilatation and curettage with hysteroscopy. 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 Due to morbid obesity Mirena IUD was placed along with Aygestin. Patient had a dramatic directed weight loss with nutritional support she went from 523 pounds to 359 pounds in 10/2009.  On 12/27/2009 she underwent a robotic-assisted laparoscopic hysterectomy, bilateral salpingo-oophorectomy, and mini laparotomy through the umbilical port with morcellation of uterus within about the delivery of the uterus. The final pathology revealed an endometrial adenocarcinoma grade 2 with invasion limited to 1 mm of the myometrium.  #2  Patient continued to do well until 09/2012 when she developed right upper quadrant pain. It was presumed to be cholelithiasis. 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 Omental biopsies were obtained. The pathology was consistent with metastatic adenocarcinoma with the presumption of recurrent endometrial carcinoma.  She was seen by Dr. Janie Morning on 10/07/2012.  Dr. Skeet Latch recommended that [name] undergo chemotherapy consisting of Taxol and Carboplatinum.  Chemotherapy started on 11/04/2012.   #3 Abdominal ascites with multiple paracenteses.  #4 Back pain and subsequent MRI on 02/05/2013 revealed compression fractures of T10 and T11.    #5 Status post kyphoplasty with Dr. Luanne Bras on 02/26/2013.  Patient states her back pain continues.  #6 Patient hospitalized and underwent more MRI's on 12/4 and L1 and L2 compression fractures were noted, after consultation with Dr. Sherwood Gambler, the patient was referred back to interventional radiology for evaluation for kyphoplasty and this was performed on 04/24/13.    #7 Patient diagnosed with right upper extremity DVT on 04/13/13.  She was initially admitted to the hospital and started on a heparin gtt to remove her port.  She was then transitioned to Enoxaparin BID.     CURRENT THERAPY:    s/p Cycle 11 day 1 of Taxol carboplatinum today 07/02/13  INTERVAL HISTORY: Patient is seen in followup today post  cycle 11 of Taxol carboplatinum start on 07/02/2013. Overall she seems to be doing well. She does present in a wheelchair today. She's been eating well. Her pain is fairly well-controlled. She is not having any difficulty sleeping. Patient does have nausea occasionally but her antiemetics to help her quite a bit. She continues to be on Lovenox for a right upper extremity DVT secondary to her port. Her right upper arm looks terrific. The right breast also looks good the swelling has resolved. She has no palpable masses. She denies having peripheral paresthesias. Today she denies any headaches double vision blurring of vision fevers chills night sweats. No shortness of breath chest pains palpitations. No abdominal pain no diarrhea or constipation. She has no easy bruising or bleeding. She has no myalgias and arthralgias. No peripheral paresthesias or gait disturbances. Remainder of the 10 point review of systems is negative.  PAST MEDICAL HISTORY: Past Medical History  Diagnosis Date  . Obesity   . DVT (deep venous thrombosis)  2009    RLE, tx for ~6 months  . PONV (postoperative nausea and vomiting)   . Swelling     BOTH LEGS  . Rash     LOWER ABD  . Gallstones   . Ascites   .  Uterine cancer     Dr. Humphrey Rolls, chemotherapy  . Family history of anesthesia complication     MOTHER IS DIFFICULT TO WAKE  . Gallstones   . Cellulitis of leg, right     PAST SURGICAL HISTORY: Past Surgical History  Procedure Laterality Date  . Ankle surgery  1990  . Wisdom tooth extraction    . Tonsillectomy and adenoidectomy    . Abdominal hysterectomy  12/2009    [facility], [facility]  . Laparoscopy N/A 10/02/2012    Procedure: LAPAROSCOPY DIAGNOSTIC, perocentisis, omental biopsy;  Surgeon: Imogene Burn. Tsuei, MD;  Location: [facility];  Service: General;  Laterality: N/A;  removed a total of 15,000 of acities  . Paracentesis    . Kyphoplasty  02/26/2013    FAMILY HISTORY: Family History  Problem Relation Age of Onset  . Heart disease Father   . Diabetes Brother   . Heart disease Mother   . Cirrhosis Father   . Diabetes Father   . Thyroid disease Mother   . Diabetes Mother   . Kidney disease Brother     from diabetes  . Breast cancer Maternal Aunt   . Breast cancer Maternal Aunt   . Melanoma Maternal Aunt   . Melanoma Maternal Aunt   . Other Maternal Aunt     lupus anticardiolipin ab syndrome    SOCIAL HISTORY: History  Substance Use Topics  . Smoking status: Never Smoker   . Smokeless tobacco: Never Used  . Alcohol Use: Yes     Comment: social, Occassionally    ALLERGIES: No Known Allergies   MEDICATIONS:  Current Outpatient Prescriptions  Medication Sig Dispense Refill  . Ascorbic Acid (VITAMIN C) 1000 MG tablet Take 1,000 mg by mouth daily.      Marland Kitchen b complex vitamins capsule Take 1 capsule by mouth daily.      Marland Kitchen CARAFATE 1 GM/10ML suspension TAKE 10 MLS BY MOUTH 4 TIMES A DAY  420 mL  2  . Cyanocobalamin (VITAMIN B-12 PO) Take 2,500 mg by mouth daily.       . cyclobenzaprine (FLEXERIL) 5 MG tablet TAKE 1 TABLET BY MOUTH 3 TIMES A DAY AS NEEDED FOR MUSCLE SPASMS  60 tablet  1  . enoxaparin (LOVENOX) 150 MG/ML injection INJECT 1 SYRINGE EVERY 12 HOURS  60 mL  0  .  ergocalciferol (VITAMIN D2) 50000 UNITS capsule Take 1 capsule (50,000 Units total) by mouth once a week.  4 capsule  5  . ferrous sulfate 325 (65 FE) MG tablet Take 1 tablet (325 mg total) by mouth 3 (three) times daily with meals.  90 tablet  2  . folic acid (FOLVITE) A999333 MCG tablet Take 400 mcg by mouth daily.        . furosemide (LASIX) 20 MG tablet Take 1 tablet (20 mg total) by mouth every other day.  30 tablet  0  . loratadine (CLARITIN) 10 MG tablet Take 10 mg by mouth daily.      . magnesium gluconate (MAGONATE) 500 MG tablet Take 500 mg by mouth daily.      . miconazole nitrate (MICATIN) POWD Apply 1 application topically 4 (four) times daily.  10 g  2  . Multiple Vitamins-Minerals (MULTIVITAMIN  WITH MINERALS) tablet Take 1 tablet by mouth daily.       Marland Kitchen nystatin ointment (MYCOSTATIN) Apply 1 application topically 2 (two) times daily.      . ondansetron (ZOFRAN ODT) 8 MG disintegrating tablet Take 1 tablet (8 mg total) by mouth every 8 (eight) hours as needed for nausea or vomiting.  20 tablet  0  . ondansetron (ZOFRAN) 8 MG tablet TAKE 1 TABLET BY MOUTH TWICE A DAY AS NEEDED START DAY AFTER CHEMO  20 tablet  1  . oxyCODONE-acetaminophen (PERCOCET) 10-325 MG per tablet Take 1 tablet by mouth every 4 (four) hours as needed for pain.  90 tablet  0  . oxyCODONE-acetaminophen (PERCOCET/ROXICET) 5-325 MG per tablet Take 1 tablet by mouth once.  1 tablet  0  . PACLitaxel (TAXOL IV) Inject 1 each into the vein every 21 ( twenty-one) days.      . pantoprazole (PROTONIX) 40 MG tablet Take 40 mg by mouth daily.      . pegfilgrastim (NEULASTA) 6 MG/0.6ML injection Inject 6 mg into the skin every 21 ( twenty-one) days.      . potassium chloride (K-DUR) 10 MEQ tablet Take 2 tablets (20 mEq total) by mouth daily.  14 tablet  0  . PRESCRIPTION MEDICATION every 21 ( twenty-one) days. CHEMOTHERAPY REGIMEN      . prochlorperazine (COMPAZINE) 10 MG tablet TAKE 1 TABLET BY MOUTH EVERY 6 HOURS AS NEEDED FOR  NAUSEA AND VOMITING  30 tablet  1  . promethazine (PHENERGAN) 25 MG tablet TAKE 1/2 TABLET BY MOUTH EVERY 6 HOURS PRN FOR NAUSEA.  15 tablet  1  . Sodium Chloride Flush (NORMAL SALINE FLUSH) 0.9 % SOLN Inject 10 mLs into the vein 2 (two) times a week.  50 Syringe  0  . Heparin Lock Flush (HEPARIN FLUSH, PORCINE,) 100 UNIT/ML injection Flush each lumen with 250 units daily  25 Syringe  0   No current facility-administered medications for this visit.      REVIEW OF SYSTEMS: A 10 point review of systems was completed and is negative except as noted above.   PHYSICAL EXAMINATION: BP 109/78  Pulse 102  Temp(Src) 98 F (36.7 C) (Oral)  Resp 20  Ht 5\' 11"  (1.803 m)  Wt 397 lb 4.8 oz (180.214 kg)  BMI 55.44 kg/m2  General: Patient is a well appearing female in no acute distress HEENT: PERRLA, sclerae anicteric no conjunctival pallor, MMM Neck: supple, no palpable adenopathy Lungs: clear to auscultation bilaterally, no wheezes, rhonchi, or rales Cardiovascular: regular rate rhythm, S1, S2, no murmurs, rubs or gallops Abdomen: Soft, non-tender, non-distended, normoactive bowel sounds, no HSM, limited exam patient morbidly obese, erythema to mid abdomen, no tenderness or warmrth Extremities: warm and well perfused, no clubbing, cyanosis, or edema, strength 5/5 all extremities Skin: No rashes  Neuro: Non-focal  ECOG FS:  3 - Symptomatic, >50% confined to bed   LAB RESULTS: Lab Results  Component Value Date   WBC 4.4 07/09/2013   NEUTROABS 3.2 07/09/2013   HGB 11.0* 07/09/2013   HCT 32.4* 07/09/2013   MCV 98.0 07/09/2013   PLT 140* 07/09/2013      Chemistry      Component Value Date/Time   NA 140 07/09/2013 0802   NA 145 04/24/2013 0824   K 3.4* 07/09/2013 0802   K 3.4* 04/24/2013 0824   CL 104 04/24/2013 0824   CL 98 10/07/2012 0923   CO2 30* 07/09/2013 0802   CO2 31 04/24/2013 0824  BUN 7.6 07/09/2013 0802   BUN 6 04/24/2013 0824   CREATININE 0.7 07/09/2013 0802   CREATININE 0.47* 04/24/2013  0824      Component Value Date/Time   CALCIUM 9.7 07/09/2013 0802   CALCIUM 8.1* 04/24/2013 0824   ALKPHOS 95 07/09/2013 0802   ALKPHOS 179* 03/12/2013 0845   AST 40* 07/09/2013 0802   AST 42* 03/12/2013 0845   ALT 56* 07/09/2013 0802   ALT 53* 03/12/2013 0845   BILITOT 0.55 07/09/2013 0802   BILITOT 0.5 03/12/2013 0845       No results found for this basename: [id number]    No components found with this basename: [id number]     RADIOGRAPHIC STUDIES: Ir Vertebroplasty Or Sacroplasty 03/02/2013   CLINICAL DATA:  Patient with severely painful compression fracture at T10 and T11. History of endometrial carcinoma. On chemotherapy.  EXAM: VERTEBROPLASTY FL AT T10 and T11.  CORE BIOPSIES OBTAINED  MEDICATIONS: Versed 4 mg. mg IV, Fentanyl 100  mcg IV.  ANESTHESIA/SEDATION: Total Moderate Sedation Time:  35 min.  FLUOROSCOPY TIME:  8 min 48 seconds. Marland Kitchen  PROCEDURE: Following a full explanation of the procedure along with the potentially associated complications, a witnessed informed consent was obtained.  The patient was placed prone on the fluoroscopic table. Nasal oxygen was administered. Physiologic monitoring was performed throughout the duration of the procedure. The skin overlying the thoracic region was prepped and draped in the usual sterile fashion. The T10 and T11 vertebral bodies were identified and the right pedicle at T10, and the left pedicle at T11 were infiltrated with 0.25% Bupivacaine. This was then followed by the advancement of a 13-gauge Cook needle through both the pedicles into the anterior one-third at both levels. Prior to this, 18 gauge core biopsy needles were used to obtain 4 samples of both the levels using a 20 mL syringe. These were sent for pathologic analysis. A gentle contrast injection demonstrated a trabecular pattern of contrast.  At this time, methylmethacrylate mixture was reconstituted. Under biplane intermittent fluoroscopy, the methylmethacrylate was then injected into  the T10 and T11 vertebral body with filling of the vertebral body at T10 and T12.  Mild extravasation was noted into the disk spaces through the inferior endplate fracture clefts . No epidural venous contamination was seen.  The needles were then removed. Hemostasis was achieved at the skin entry site.  There were no acute complications. Patient tolerated the procedure well. The patient was observed for 3 hours and discharged in good condition.  IMPRESSION: Status post vertebral body augmentation for painful compression fracture at T10 and T11 using vertebroplasty technique.  Core biopsies obtained at T10 and T11. Sample sent for pathologic analysis.   Electronically Signed   By: Luanne Bras M.D.   On: 02/26/2013 13:10      ASSESSMENT/PLAN: [name] is a 48 y.o. woman:  #1 Recurrent uterine cancer with peritoneal carcinomatosis and ascites:. Patient is status post cycle 11 day 1 of Taxol carboplatinum. Overall she continues to do well with the treatment. She has not had any allergic reactions to the carboplatinum. She is going to be receiving cycle 12 on 07/24/2013. When she completes that we will plan on doing restaging studies including CT chest abdomen and pelvis. After which she will be referred back to Dr. Skeet Latch.  #2 Ascites: Patient has had multiple paracenteses performed. However with chemotherapy we have not had to do a paracentesis for quite some time. At this time we will continue to follow.  #  3 Back pain - Compression fractures at T10 and T11 per MRI on 02/05/2013.  Status post kyphoplasty on 02/26/2013.  Patient with repeat MRI on 12/4 demonstrating compression fractures at L1 and L2.  Patient underwent L1 and L2 kyphoplasty on 04/24/12.  Her pain is much improved. She will continue her present pain regimen.  #4  Right upper extremity DVT: Diagnosed 04/13/13.  She was started on a Heparin gtt and admitted to the hospital where she underwent right port removal and transitioned  to Lovenox BID.  She is tolerating the Lovenox well.  For now we will continue Lovenox.  #5 right breast swelling: improved significantly  #6 rash on left hand: resolved  #7 PICC line: Patient has a PICC line in her left upper extremity. Looks clean, no signs of infections. She is getting home health changes. However today will be the last day for this. After which patient will have to have her dressing changes done in the clinic. Her mother is gong to be doing the flushes at home.  #8 anemia and mild thrombocytopenia: Secondary to recent chemotherapy. We will continue to monitor  #9 followup: Patient will return in 2 weeks' time for cycle 12 day 1 of Taxol carboplatinum. She will be seen by the nurse practitioner in my absence. At that time I also recommend that she be set up for CT scans and PET scan for restaging purposes.   The length of time of the face-to-face encounter was 30  minutes. More than 50% of time was spent counseling and coordination of care.   Marcy Panning, MD Medical/Oncology [facility] [phone] (beeper) [phone] (Office)  07/09/2013, 9:06 AM

## 2013-07-09 NOTE — Telephone Encounter (Signed)
appts made and printed...td 

## 2013-07-09 NOTE — Telephone Encounter (Signed)
Heparin flushes ordered for pt to flush PICC line at home.

## 2013-07-10 ENCOUNTER — Telehealth: Payer: Self-pay | Admitting: *Deleted

## 2013-07-10 NOTE — Telephone Encounter (Signed)
Message copied by Amelia Jo I on Fri Jul 10, 2013  1:20 PM ------      Message from: [name]      Created: Thu Jul 09, 2013  9:19 AM       Please let patient to increase Potassium intake through foods ------

## 2013-07-14 ENCOUNTER — Other Ambulatory Visit: Payer: Self-pay | Admitting: *Deleted

## 2013-07-14 DIAGNOSIS — C549 Malignant neoplasm of corpus uteri, unspecified: Secondary | ICD-10-CM

## 2013-07-14 MED ORDER — SUCRALFATE 1 GM/10ML PO SUSP: ORAL | Status: DC

## 2013-07-16 ENCOUNTER — Ambulatory Visit (HOSPITAL_BASED_OUTPATIENT_CLINIC_OR_DEPARTMENT_OTHER): Payer: BC Managed Care – PPO

## 2013-07-16 ENCOUNTER — Other Ambulatory Visit: Payer: Self-pay | Admitting: Oncology

## 2013-07-16 VITALS — BP 115/66 | HR 99 | Temp 97.6°F

## 2013-07-16 DIAGNOSIS — Z452 Encounter for adjustment and management of vascular access device: Secondary | ICD-10-CM

## 2013-07-16 DIAGNOSIS — C549 Malignant neoplasm of corpus uteri, unspecified: Secondary | ICD-10-CM

## 2013-07-16 MED ORDER — SODIUM CHLORIDE 0.9 % IJ SOLN
10.0000 mL | INTRAMUSCULAR | Status: DC | PRN
  Administered 2013-07-16: 10 mL via INTRAVENOUS
  Filled 2013-07-16: qty 10

## 2013-07-16 MED ORDER — HEPARIN SOD (PORK) LOCK FLUSH 100 UNIT/ML IV SOLN
500.0000 [IU] | Freq: Once | INTRAVENOUS | Status: AC
  Administered 2013-07-16: 250 [IU] via INTRAVENOUS
  Filled 2013-07-16: qty 5

## 2013-07-23 NOTE — Progress Notes (Signed)
Hematology and Oncology Follow Up Visit  CHANEL HANIGAN AG:[id number] Oct 10, 1965 48 y.o. 07/25/2013 12:20 PM     Principle Diagnosis:Zailey M Menefee 48 y.o. female with recurrent endometrial carcinoma.    Prior Therapy:  #1 Patient developed excessive uterine bleeding. She underwent dilatation and curettage with hysteroscopy. 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 Due to morbid obesity Mirena IUD was placed along with Aygestin. Patient had a dramatic directed weight loss with nutritional support she went from 523 pounds to 359 pounds in 10/2009. On 12/27/2009 she underwent a robotic-assisted laparoscopic hysterectomy, bilateral salpingo-oophorectomy, and mini laparotomy through the umbilical port with morcellation of uterus within about the delivery of the uterus. The final pathology revealed an endometrial adenocarcinoma grade 2 with invasion limited to 1 mm of the myometrium.   #2 Patient continued to do well until 09/2012 when she developed right upper quadrant pain. It was presumed to be cholelithiasis. 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 Omental biopsies were obtained. The pathology was consistent with metastatic adenocarcinoma with the presumption of recurrent endometrial carcinoma. She was seen by Dr. Janie Morning on 10/07/2012. Dr. Skeet Latch recommended that Ms. Gorsline undergo chemotherapy consisting of Taxol and Carboplatinum. Chemotherapy started on 11/04/2012.   #3 Abdominal ascites with multiple paracenteses.   #4 Back pain and subsequent MRI on 02/05/2013 revealed compression fractures of T10 and T11.  #5 Status post kyphoplasty with Dr. Luanne Bras on 02/26/2013. Patient states her back pain continues.   #6 Patient hospitalized and underwent more MRI's on 12/4 and L1 and L2 compression fractures were noted, after  consultation with Dr. Sherwood Gambler, the patient was referred back to interventional radiology for evaluation for kyphoplasty and this was performed on 04/24/13.   #7 Patient diagnosed with right upper extremity DVT on 04/13/13. She was initially admitted to the hospital and started on a heparin gtt to remove her port. She was then transitioned to Enoxaparin BID.    Current therapy: Taxol/Carboplatin cycle 12 day 1  Interim History: Cheylynn [place] Ketterman 48 y.o. female with recurrent endometrial carcinoma here for evaluation prior to her final cycle of Taxol/Carbo.  She receives this treatment every 21 days with Neulasta on day 2.  She is doing moderately well today.  She is slightly dizzy.  Her BP is slightly low today.  She reports having a good appetite and fluid intake.  She does have numbness in her toes.  It does not affect her balance and it is not painful.  She has mild intermittent numbness in her fingertips.  Her pain is controlled well with Percocet.  She is requesting a refill today.  She denies fevers, chills, nausea,vomiting, constipation, diarrhea, mouth pain, nail dyscrasia or any other concerns.    Medications:  Current Outpatient Prescriptions  Medication Sig Dispense Refill  . Ascorbic Acid (VITAMIN C) 1000 MG tablet Take 1,000 mg by mouth daily.      Marland Kitchen b complex vitamins capsule Take 1 capsule by mouth daily.      . Cyanocobalamin (VITAMIN B-12 PO) Take 2,500 mg by mouth daily.       . cyclobenzaprine (FLEXERIL) 5 MG tablet TAKE 1 TABLET BY MOUTH 3 TIMES A DAY AS NEEDED FOR MUSCLE SPASMS  60 tablet  1  . enoxaparin (LOVENOX) 150 MG/ML injection INJECT 1 SYRINGE EVERY 12 HOURS  60 mL  0  . ergocalciferol (VITAMIN D2) 50000 UNITS  capsule Take 1 capsule (50,000 Units total) by mouth once a week.  4 capsule  5  . folic acid (FOLVITE) 831 MCG tablet Take 400 mcg by mouth daily.        . furosemide (LASIX) 20 MG tablet Take 1 tablet (20 mg total) by mouth every other day.  30 tablet  0  . Heparin  Lock Flush (HEPARIN FLUSH, PORCINE,) 100 UNIT/ML injection Flush each lumen with 250 units daily  25 Syringe  0  . loratadine (CLARITIN) 10 MG tablet Take 10 mg by mouth daily.      . magnesium gluconate (MAGONATE) 500 MG tablet Take 500 mg by mouth daily.      . miconazole nitrate (MICATIN) POWD Apply 1 application topically 4 (four) times daily.  10 g  2  . Multiple Vitamins-Minerals (MULTIVITAMIN WITH MINERALS) tablet Take 1 tablet by mouth daily.       Marland Kitchen nystatin ointment (MYCOSTATIN) Apply 1 application topically 2 (two) times daily.      Marland Kitchen oxyCODONE-acetaminophen (PERCOCET) 10-325 MG per tablet Take 1 tablet by mouth every 4 (four) hours as needed for pain.  90 tablet  0  . PACLitaxel (TAXOL IV) Inject 1 each into the vein every 21 ( twenty-one) days.      . pantoprazole (PROTONIX) 40 MG tablet Take 40 mg by mouth daily.      . pegfilgrastim (NEULASTA) 6 MG/0.6ML injection Inject 6 mg into the skin every 21 ( twenty-one) days.      . potassium chloride (K-DUR) 10 MEQ tablet Take 2 tablets (20 mEq total) by mouth daily.  14 tablet  0  . PRESCRIPTION MEDICATION every 21 ( twenty-one) days. CHEMOTHERAPY REGIMEN      . Sodium Chloride Flush (NORMAL SALINE FLUSH) 0.9 % SOLN Inject 10 mLs into the vein 2 (two) times a week.  50 Syringe  0  . sucralfate (CARAFATE) 1 GM/10ML suspension TAKE 10 MLS BY MOUTH 4 TIMES A DAY  420 mL  2  . ferrous sulfate 325 (65 FE) MG tablet TAKE 1 TABLET BY MOUTH 3 TIMES A DAY WITH MEALS  90 tablet  2  . gabapentin (NEURONTIN) 100 MG capsule Take 1 capsule (100 mg total) by mouth 3 (three) times daily.  90 capsule  0  . ondansetron (ZOFRAN ODT) 8 MG disintegrating tablet Take 1 tablet (8 mg total) by mouth every 8 (eight) hours as needed for nausea or vomiting.  20 tablet  0  . ondansetron (ZOFRAN) 8 MG tablet TAKE 1 TABLET BY MOUTH TWICE A DAY AS NEEDED START DAY AFTER CHEMO  20 tablet  1  . prochlorperazine (COMPAZINE) 10 MG tablet TAKE 1 TABLET BY MOUTH EVERY 6 HOURS  AS NEEDED FOR NAUSEA AND VOMITING  30 tablet  1  . promethazine (PHENERGAN) 25 MG tablet TAKE 1/2 TABLET BY MOUTH EVERY 6 HOURS PRN FOR NAUSEA.  15 tablet  1   No current facility-administered medications for this visit.     Allergies: No Known Allergies  Medical History: Past Medical History  Diagnosis Date  . Obesity   . DVT (deep venous thrombosis) 2009    RLE, tx for ~6 months  . PONV (postoperative nausea and vomiting)   . Swelling     BOTH LEGS  . Rash     LOWER ABD  . Gallstones   . Ascites   . Uterine cancer     Dr. Humphrey Rolls, chemotherapy  . Family history of anesthesia complication  MOTHER IS DIFFICULT TO WAKE  . Gallstones   . Cellulitis of leg, right     Surgical History:  Past Surgical History  Procedure Laterality Date  . Ankle surgery  1990  . Wisdom tooth extraction    . Tonsillectomy and adenoidectomy    . Abdominal hysterectomy  12/2009    [facility], [facility]  . Laparoscopy N/A 10/02/2012    Procedure: LAPAROSCOPY DIAGNOSTIC, perocentisis, omental biopsy;  Surgeon: Imogene Burn. Tsuei, MD;  Location: [facility];  Service: General;  Laterality: N/A;  removed a total of 15,000 of acities  . Paracentesis    . Kyphoplasty  02/26/2013     Review of Systems: A 10 point review of systems was conducted and is otherwise negative except for what is noted above.     Physical Exam: Blood pressure 84/53, pulse 108, temperature 98 F (36.7 C), temperature source Oral, resp. rate 20, height 5\' 11"  (1.803 m), weight 410 lb (185.975 kg), SpO2 100.00%. EXAM LIMITED PATIENT IN WHEELCHAIR. GENERAL: Patient is a well appearing, morbidly obese female in no acute distress HEENT:  Sclerae anicteric.  Oropharynx clear and moist. No ulcerations or evidence of oropharyngeal candidiasis. Neck is supple.  NODES:  No cervical, supraclavicular, or axillary lymphadenopathy palpated.  BREAST EXAM:  Deferred. LUNGS:  Clear to auscultation bilaterally.  No wheezes or rhonchi. HEART:  Regular rate  and rhythm. No murmur appreciated. ABDOMEN:  Soft, nontender.  Positive, normoactive bowel sounds. No organomegaly palpated. Difficult to appreciate due to large body habitus.   MSK:  No focal spinal tenderness to palpation. Full range of motion bilaterally in the upper extremities. EXTREMITIES:  No peripheral edema.   SKIN:  Clear with no obvious rashes or skin changes. No nail dyscrasia. NEURO:  Nonfocal. Well oriented.  Appropriate affect. ECOG PERFORMANCE STATUS: 2 - Symptomatic, <50% confined to bed   Lab Results: Lab Results  Component Value Date   WBC 5.9 07/24/2013   HGB 10.2* 07/24/2013   HCT 32.6* 07/24/2013   MCV 104.2* 07/24/2013   PLT 154 07/24/2013     Chemistry      Component Value Date/Time   NA 141 07/24/2013 0957   NA 145 04/24/2013 0824   K 3.7 07/24/2013 0957   K 3.4* 04/24/2013 0824   CL 104 04/24/2013 0824   CL 98 10/07/2012 0923   CO2 23 07/24/2013 0957   CO2 31 04/24/2013 0824   BUN 8.7 07/24/2013 0957   BUN 6 04/24/2013 0824   CREATININE 0.7 07/24/2013 0957   CREATININE 0.47* 04/24/2013 0824      Component Value Date/Time   CALCIUM 9.5 07/24/2013 0957   CALCIUM 8.1* 04/24/2013 0824   ALKPHOS 70 07/24/2013 0957   ALKPHOS 179* 03/12/2013 0845   AST 32 07/24/2013 0957   AST 42* 03/12/2013 0845   ALT 38 07/24/2013 0957   ALT 53* 03/12/2013 0845   BILITOT 0.40 07/24/2013 0957   BILITOT 0.5 03/12/2013 0845        Assessment and Plan: Sharyne Peach 48 y.o. female with  1. Recurrent endometrial carcinoma:  The patient is here for her final planned cycle of Taxol and Carboplatin given on day 1 of a 21 day cycle with Neulasta support given on day two.  Her labs are stable today.  She will proceed with chemotherapy.    2. Ascites:  The patient has a h/o of ascites that began towards the beginning of her chemotherapy regimens.  She previously has underwent several paracenteses.  However  this has since resolved.    3. Back pain.  Patient has had compression fractures of T10 and T11, L1 and  L2.  She underwent kyphoplasty on 02/26/13 and 04/24/13.  Her back pain is much improved since this treatment.  She takes Oxycodone if needed and it does work well for her.  I refilled Percocet 10-325 #90 today.    4. Right upper extremity DVT:  This was diagnosed on 04/13/13 in the emergency room.  At first she received a heparin gtt, and was later transitioned to Lovenox after her port was removed.  She continues to receive Lovenox and is tolerating it well.    5. Neuropathy.  The patient's main complaint is numbness in her feet.  She denies any balance changes due to this.  I discussed neuropathy.  The fact that it may be irreversible.  I discussed Gabapentin with the patient and her family in detail.  I prescribed Gabapentin 100mg  TID for the patient.  I told her to call our office if it wasn't improving at that dose and we would increase it.    The patient will return in one week for labs and evaluation for any chemotoxicities.  She and her family know to call us in the interim for any questions or concerns.  We can certainly see her sooner if needed.    I spent 25 minutes counseling the patient face to face.  The total time spent in the appointment was 30 minutes.  Minette Headland, [place] [phone] 07/25/2013 12:20 PM

## 2013-07-24 ENCOUNTER — Ambulatory Visit (HOSPITAL_BASED_OUTPATIENT_CLINIC_OR_DEPARTMENT_OTHER): Payer: BC Managed Care – PPO

## 2013-07-24 ENCOUNTER — Ambulatory Visit: Payer: BC Managed Care – PPO

## 2013-07-24 ENCOUNTER — Other Ambulatory Visit: Payer: Self-pay | Admitting: *Deleted

## 2013-07-24 ENCOUNTER — Encounter: Payer: Self-pay | Admitting: Adult Health

## 2013-07-24 ENCOUNTER — Other Ambulatory Visit: Payer: Self-pay | Admitting: Adult Health

## 2013-07-24 ENCOUNTER — Ambulatory Visit (HOSPITAL_BASED_OUTPATIENT_CLINIC_OR_DEPARTMENT_OTHER): Payer: BC Managed Care – PPO | Admitting: Adult Health

## 2013-07-24 ENCOUNTER — Other Ambulatory Visit (HOSPITAL_BASED_OUTPATIENT_CLINIC_OR_DEPARTMENT_OTHER): Payer: BC Managed Care – PPO

## 2013-07-24 VITALS — BP 84/53 | HR 108 | Temp 98.0°F | Resp 20 | Ht 71.0 in | Wt >= 6400 oz

## 2013-07-24 VITALS — BP 103/74 | HR 81 | Temp 98.9°F | Resp 16

## 2013-07-24 DIAGNOSIS — G5790 Unspecified mononeuropathy of unspecified lower limb: Secondary | ICD-10-CM

## 2013-07-24 DIAGNOSIS — C541 Malignant neoplasm of endometrium: Secondary | ICD-10-CM

## 2013-07-24 DIAGNOSIS — G629 Polyneuropathy, unspecified: Secondary | ICD-10-CM

## 2013-07-24 DIAGNOSIS — C549 Malignant neoplasm of corpus uteri, unspecified: Secondary | ICD-10-CM

## 2013-07-24 DIAGNOSIS — M549 Dorsalgia, unspecified: Secondary | ICD-10-CM

## 2013-07-24 DIAGNOSIS — E860 Dehydration: Secondary | ICD-10-CM

## 2013-07-24 DIAGNOSIS — R188 Other ascites: Secondary | ICD-10-CM

## 2013-07-24 DIAGNOSIS — I82629 Acute embolism and thrombosis of deep veins of unspecified upper extremity: Secondary | ICD-10-CM

## 2013-07-24 DIAGNOSIS — Z5111 Encounter for antineoplastic chemotherapy: Secondary | ICD-10-CM

## 2013-07-24 LAB — CBC WITH DIFFERENTIAL/PLATELET
BASO%: 0.3 % (ref 0.0–2.0)
BASOS ABS: 0 10*3/uL (ref 0.0–0.1)
EOS ABS: 0 10*3/uL (ref 0.0–0.5)
EOS%: 0.7 % (ref 0.0–7.0)
HEMATOCRIT: 32.6 % — AB (ref 34.8–46.6)
HEMOGLOBIN: 10.2 g/dL — AB (ref 11.6–15.9)
LYMPH%: 24 % (ref 14.0–49.7)
MCH: 32.6 pg (ref 25.1–34.0)
MCHC: 31.3 g/dL — ABNORMAL LOW (ref 31.5–36.0)
MCV: 104.2 fL — AB (ref 79.5–101.0)
MONO#: 0.6 10*3/uL (ref 0.1–0.9)
MONO%: 9.7 % (ref 0.0–14.0)
NEUT%: 65.3 % (ref 38.4–76.8)
NEUTROS ABS: 3.8 10*3/uL (ref 1.5–6.5)
Platelets: 154 10*3/uL (ref 145–400)
RBC: 3.13 10*6/uL — ABNORMAL LOW (ref 3.70–5.45)
RDW: 19.1 % — AB (ref 11.2–14.5)
WBC: 5.9 10*3/uL (ref 3.9–10.3)
lymph#: 1.4 10*3/uL (ref 0.9–3.3)

## 2013-07-24 LAB — COMPREHENSIVE METABOLIC PANEL (CC13)
ALBUMIN: 3.2 g/dL — AB (ref 3.5–5.0)
ALT: 38 U/L (ref 0–55)
AST: 32 U/L (ref 5–34)
Alkaline Phosphatase: 70 U/L (ref 40–150)
Anion Gap: 13 mEq/L — ABNORMAL HIGH (ref 3–11)
BUN: 8.7 mg/dL (ref 7.0–26.0)
CALCIUM: 9.5 mg/dL (ref 8.4–10.4)
CO2: 23 mEq/L (ref 22–29)
Chloride: 105 mEq/L (ref 98–109)
Creatinine: 0.7 mg/dL (ref 0.6–1.1)
GLUCOSE: 154 mg/dL — AB (ref 70–140)
POTASSIUM: 3.7 meq/L (ref 3.5–5.1)
Sodium: 141 mEq/L (ref 136–145)
Total Bilirubin: 0.4 mg/dL (ref 0.20–1.20)
Total Protein: 6.9 g/dL (ref 6.4–8.3)

## 2013-07-24 MED ORDER — CARBOPLATIN CHEMO INTRADERMAL TEST DOSE 100MCG/0.02ML
100.0000 ug | Freq: Once | INTRADERMAL | Status: AC
  Administered 2013-07-24: 100 ug via INTRADERMAL
  Filled 2013-07-24: qty 0.01

## 2013-07-24 MED ORDER — PALONOSETRON HCL INJECTION 0.25 MG/5ML
0.2500 mg | Freq: Once | INTRAVENOUS | Status: AC
  Administered 2013-07-24: 0.25 mg via INTRAVENOUS

## 2013-07-24 MED ORDER — GABAPENTIN 100 MG PO CAPS: 100.0000 mg | ORAL_CAPSULE | Freq: Three times a day (TID) | ORAL | Status: DC

## 2013-07-24 MED ORDER — HEPARIN SOD (PORK) LOCK FLUSH 100 UNIT/ML IV SOLN
250.0000 [IU] | Freq: Once | INTRAVENOUS | Status: AC
  Administered 2013-07-24: 250 [IU] via INTRAVENOUS
  Filled 2013-07-24: qty 5

## 2013-07-24 MED ORDER — HEPARIN SOD (PORK) LOCK FLUSH 100 UNIT/ML IV SOLN
500.0000 [IU] | Freq: Once | INTRAVENOUS | Status: AC | PRN
  Administered 2013-07-24: 500 [IU]
  Filled 2013-07-24: qty 5

## 2013-07-24 MED ORDER — FAMOTIDINE IN NACL 20-0.9 MG/50ML-% IV SOLN
20.0000 mg | Freq: Once | INTRAVENOUS | Status: AC
  Administered 2013-07-24: 20 mg via INTRAVENOUS

## 2013-07-24 MED ORDER — SODIUM CHLORIDE 0.9 % IJ SOLN
10.0000 mL | INTRAMUSCULAR | Status: DC | PRN
  Administered 2013-07-24: 10 mL
  Filled 2013-07-24: qty 10

## 2013-07-24 MED ORDER — PACLITAXEL CHEMO INJECTION 300 MG/50ML
175.0000 mg/m2 | Freq: Once | INTRAVENOUS | Status: AC
  Administered 2013-07-24: 534 mg via INTRAVENOUS
  Filled 2013-07-24: qty 89

## 2013-07-24 MED ORDER — SODIUM CHLORIDE 0.9 % IV SOLN
150.0000 mg | Freq: Once | INTRAVENOUS | Status: AC
  Administered 2013-07-24: 150 mg via INTRAVENOUS
  Filled 2013-07-24: qty 5

## 2013-07-24 MED ORDER — DEXAMETHASONE SODIUM PHOSPHATE 20 MG/5ML IJ SOLN
12.0000 mg | Freq: Once | INTRAMUSCULAR | Status: AC
  Administered 2013-07-24: 12 mg via INTRAVENOUS

## 2013-07-24 MED ORDER — SODIUM CHLORIDE 0.9 % IV SOLN
900.0000 mg | Freq: Once | INTRAVENOUS | Status: AC
  Administered 2013-07-24: 900 mg via INTRAVENOUS
  Filled 2013-07-24: qty 90

## 2013-07-24 MED ORDER — PALONOSETRON HCL INJECTION 0.25 MG/5ML
INTRAVENOUS | Status: AC
  Filled 2013-07-24: qty 5

## 2013-07-24 MED ORDER — FAMOTIDINE IN NACL 20-0.9 MG/50ML-% IV SOLN
INTRAVENOUS | Status: AC
  Filled 2013-07-24: qty 50

## 2013-07-24 MED ORDER — SODIUM CHLORIDE 0.9 % IV SOLN
Freq: Once | INTRAVENOUS | Status: AC
  Administered 2013-07-24: 12:00:00 via INTRAVENOUS

## 2013-07-24 MED ORDER — OXYCODONE-ACETAMINOPHEN 10-325 MG PO TABS: 1.0000 | ORAL_TABLET | ORAL | Status: DC | PRN

## 2013-07-24 MED ORDER — SODIUM CHLORIDE 0.9 % IJ SOLN
10.0000 mL | Freq: Once | INTRAMUSCULAR | Status: AC
  Administered 2013-07-24: 10 mL
  Filled 2013-07-24: qty 10

## 2013-07-24 MED ORDER — DIPHENHYDRAMINE HCL 50 MG/ML IJ SOLN
50.0000 mg | Freq: Once | INTRAMUSCULAR | Status: AC
  Administered 2013-07-24: 50 mg via INTRAVENOUS

## 2013-07-24 MED ORDER — DEXAMETHASONE SODIUM PHOSPHATE 20 MG/5ML IJ SOLN
INTRAMUSCULAR | Status: AC
  Filled 2013-07-24: qty 5

## 2013-07-24 MED ORDER — DIPHENHYDRAMINE HCL 50 MG/ML IJ SOLN
INTRAMUSCULAR | Status: AC
  Filled 2013-07-24: qty 1

## 2013-07-24 MED ORDER — SODIUM CHLORIDE 0.9 % IV SOLN
Freq: Once | INTRAVENOUS | Status: AC
  Administered 2013-07-24: 13:00:00 via INTRAVENOUS

## 2013-07-24 NOTE — Patient Instructions (Signed)
PICC Home Guide A peripherally inserted central catheter (PICC) is a long, thin, flexible tube that is inserted into a vein in the upper arm. It is a form of intravenous (IV) access. It is considered to be a "central" line because the tip of the PICC ends in a large vein in your chest. This large vein is called the superior vena cava (SVC). The PICC tip ends in the SVC because there is a lot of blood flow in the SVC. This allows medicines and IV fluids to be quickly distributed throughout the body. The PICC is inserted using a sterile technique by a specially trained nurse or physician. After the PICC is inserted, a chest X-ray exam is done to be sure it is in the correct place.  A PICC may be placed for different reasons, such as:  To give medicines and liquid nutrition that can only be given through a central line. Examples are:  Certain antibiotic treatments.  Chemotherapy.  Total parenteral nutrition (TPN).  To take frequent blood samples.  To give IV fluids and blood products.  If there is difficulty placing a peripheral intravenous (PIV) catheter. If taken care of properly, a PICC can remain in place for several months. A PICC can also allow a person to go home from the hospital early. Medicine and PICC care can be managed at home by a family member or home health care team. WHAT PROBLEMS CAN HAPPEN WHEN I HAVE A PICC? Problems with a PICC can occasionally occur. These may include:  A blood clot (thrombus) forming in or at the tip of the PICC. This can cause the PICC to become clogged. A clot-dissolving medicine called tissue plasminogen activator (tPA) can be given through the PICC to help break up the clot.  Inflammation of the vein (phlebitis) in which the PICC is placed. Signs of inflammation may include redness, pain at the insertion site, red streaks, or being able to feel a "cord" in the vein where the PICC is located.  Infection in the PICC or at the insertion site. Signs of  infection may include fever, chills, redness, swelling, or pus drainage from the PICC insertion site.  PICC movement (malposition). The PICC tip may move from its original position due to excessive physical activity, forceful coughing, sneezing, or vomiting.  A break or cut in the PICC. It is important to not use scissors near the PICC.  Nerve or tendon irritation or injury during PICC insertion. WHAT SHOULD I KEEP IN MIND ABOUT ACTIVITIES WHEN I HAVE A PICC?  You may bend your arm and move it freely. If your PICC is near or at the bend of your elbow, avoid activity with repeated motion at the elbow.  Rest at home for the remainder of the day following PICC line insertion.  Avoid lifting heavy objects as instructed by your health care provider.  Avoid using a crutch with the arm on the same side as your PICC. You may need to use a walker. WHAT SHOULD I KNOW ABOUT MY PICC DRESSING?  Keep your PICC bandage (dressing) clean and dry to prevent infection.  Ask your health care provider when you may shower. Ask your health care provider to teach you how to wrap the PICC when you do take a shower.  Change the PICC dressing as instructed by your health care provider.  Change your PICC dressing if it becomes loose or wet. WHAT SHOULD I KNOW ABOUT PICC CARE?  Check the PICC insertion site daily for   leakage, redness, swelling, or pain.  Do not take a bath, swim, or use hot tubs when you have a PICC. Cover PICC line with clear plastic wrap and tape to keep it dry while showering.  Flush the PICC as directed by your health care provider. Let your health care provider know right away if the PICC is difficult to flush or does not flush. Do not use force to flush the PICC.  Do not use a syringe that is less than 10 mL to flush the PICC.  Never pull or tug on the PICC.  Avoid blood pressure checks on the arm with the PICC.  Keep your PICC identification card with you at all times.  Do not  take the PICC out yourself. Only a trained clinical professional should remove the PICC. SEEK IMMEDIATE MEDICAL CARE IF:  Your PICC is accidently pulled all the way out. If this happens, cover the insertion site with a bandage or gauze dressing. Do not throw the PICC away. Your health care provider will need to inspect it.  Your PICC was tugged or pulled and has partially come out. Do not  push the PICC back in.  There is any type of drainage, redness, or swelling where the PICC enters the skin.  You cannot flush the PICC, it is difficult to flush, or the PICC leaks around the insertion site when it is flushed.  You hear a "flushing" sound when the PICC is flushed.  You have pain, discomfort, or numbness in your arm, shoulder, or jaw on the same side as the PICC.  You feel your heart "racing" or skipping beats.  You notice a hole or tear in the PICC.  You develop chills or a fever. MAKE SURE YOU:   Understand these instructions.  Will watch your condition.  Will get help right away if you are not doing well or get worse. Document Released: 10/14/2002 Document Revised: 01/28/2013 Document Reviewed: 12/15/2012 [employer] Patient Information 2014 [employer].  

## 2013-07-24 NOTE — Patient Instructions (Signed)
[facility] Discharge Instructions for Patients Receiving Chemotherapy  Today you received the following chemotherapy agents taxol/carboplatin  To help prevent nausea and vomiting after your treatment, we encourage you to take your nausea medication as directed   If you develop nausea and vomiting that is not controlled by your nausea medication, call the clinic.   BELOW ARE SYMPTOMS THAT SHOULD BE REPORTED IMMEDIATELY:  *FEVER GREATER THAN 100.5 F  *CHILLS WITH OR WITHOUT FEVER  NAUSEA AND VOMITING THAT IS NOT CONTROLLED WITH YOUR NAUSEA MEDICATION  *UNUSUAL SHORTNESS OF BREATH  *UNUSUAL BRUISING OR BLEEDING  TENDERNESS IN MOUTH AND THROAT WITH OR WITHOUT PRESENCE OF ULCERS  *URINARY PROBLEMS  *BOWEL PROBLEMS  UNUSUAL RASH Items with * indicate a potential emergency and should be followed up as soon as possible.  Feel free to call the clinic you have any questions or concerns. The clinic phone number is [phone].  

## 2013-07-25 ENCOUNTER — Ambulatory Visit (HOSPITAL_BASED_OUTPATIENT_CLINIC_OR_DEPARTMENT_OTHER): Payer: BC Managed Care – PPO

## 2013-07-25 VITALS — BP 122/77 | HR 94 | Temp 97.1°F | Resp 20

## 2013-07-25 DIAGNOSIS — C786 Secondary malignant neoplasm of retroperitoneum and peritoneum: Secondary | ICD-10-CM

## 2013-07-25 DIAGNOSIS — C549 Malignant neoplasm of corpus uteri, unspecified: Secondary | ICD-10-CM

## 2013-07-25 DIAGNOSIS — Z5189 Encounter for other specified aftercare: Secondary | ICD-10-CM

## 2013-07-25 DIAGNOSIS — C541 Malignant neoplasm of endometrium: Secondary | ICD-10-CM

## 2013-07-25 MED ORDER — PEGFILGRASTIM INJECTION 6 MG/0.6ML
6.0000 mg | Freq: Once | SUBCUTANEOUS | Status: AC
  Administered 2013-07-25: 6 mg via SUBCUTANEOUS

## 2013-07-29 ENCOUNTER — Other Ambulatory Visit: Payer: Self-pay | Admitting: Adult Health

## 2013-07-29 DIAGNOSIS — I82629 Acute embolism and thrombosis of deep veins of unspecified upper extremity: Secondary | ICD-10-CM

## 2013-07-31 ENCOUNTER — Encounter: Payer: Self-pay | Admitting: Adult Health

## 2013-07-31 ENCOUNTER — Other Ambulatory Visit: Payer: Self-pay | Admitting: Oncology

## 2013-07-31 ENCOUNTER — Ambulatory Visit (HOSPITAL_BASED_OUTPATIENT_CLINIC_OR_DEPARTMENT_OTHER): Payer: BC Managed Care – PPO | Admitting: Adult Health

## 2013-07-31 ENCOUNTER — Other Ambulatory Visit (HOSPITAL_BASED_OUTPATIENT_CLINIC_OR_DEPARTMENT_OTHER): Payer: BC Managed Care – PPO

## 2013-07-31 VITALS — BP 113/74 | HR 103 | Temp 98.0°F | Resp 20 | Ht 71.0 in | Wt 392.8 lb

## 2013-07-31 DIAGNOSIS — R1011 Right upper quadrant pain: Secondary | ICD-10-CM

## 2013-07-31 DIAGNOSIS — G589 Mononeuropathy, unspecified: Secondary | ICD-10-CM

## 2013-07-31 DIAGNOSIS — C549 Malignant neoplasm of corpus uteri, unspecified: Secondary | ICD-10-CM

## 2013-07-31 DIAGNOSIS — M549 Dorsalgia, unspecified: Secondary | ICD-10-CM

## 2013-07-31 DIAGNOSIS — R188 Other ascites: Secondary | ICD-10-CM

## 2013-07-31 DIAGNOSIS — I82409 Acute embolism and thrombosis of unspecified deep veins of unspecified lower extremity: Secondary | ICD-10-CM

## 2013-07-31 DIAGNOSIS — C541 Malignant neoplasm of endometrium: Secondary | ICD-10-CM

## 2013-07-31 LAB — COMPREHENSIVE METABOLIC PANEL (CC13)
ALBUMIN: 3.6 g/dL (ref 3.5–5.0)
ALT: 48 U/L (ref 0–55)
AST: 36 U/L — ABNORMAL HIGH (ref 5–34)
Alkaline Phosphatase: 100 U/L (ref 40–150)
Anion Gap: 13 mEq/L — ABNORMAL HIGH (ref 3–11)
BUN: 6.4 mg/dL — AB (ref 7.0–26.0)
CALCIUM: 9.8 mg/dL (ref 8.4–10.4)
CHLORIDE: 97 meq/L — AB (ref 98–109)
CO2: 30 mEq/L — ABNORMAL HIGH (ref 22–29)
Creatinine: 0.7 mg/dL (ref 0.6–1.1)
Glucose: 118 mg/dl (ref 70–140)
Potassium: 4 mEq/L (ref 3.5–5.1)
Sodium: 140 mEq/L (ref 136–145)
Total Bilirubin: 0.51 mg/dL (ref 0.20–1.20)
Total Protein: 7.4 g/dL (ref 6.4–8.3)

## 2013-07-31 LAB — CBC WITH DIFFERENTIAL/PLATELET
BASO%: 0.7 % (ref 0.0–2.0)
BASOS ABS: 0 10*3/uL (ref 0.0–0.1)
EOS ABS: 0.1 10*3/uL (ref 0.0–0.5)
EOS%: 1.6 % (ref 0.0–7.0)
HCT: 30.2 % — ABNORMAL LOW (ref 34.8–46.6)
HGB: 10.1 g/dL — ABNORMAL LOW (ref 11.6–15.9)
LYMPH#: 1.1 10*3/uL (ref 0.9–3.3)
LYMPH%: 20.9 % (ref 14.0–49.7)
MCH: 33.6 pg (ref 25.1–34.0)
MCHC: 33.3 g/dL (ref 31.5–36.0)
MCV: 101 fL (ref 79.5–101.0)
MONO#: 0.5 10*3/uL (ref 0.1–0.9)
MONO%: 8.6 % (ref 0.0–14.0)
NEUT%: 68.2 % (ref 38.4–76.8)
NEUTROS ABS: 3.7 10*3/uL (ref 1.5–6.5)
Platelets: 107 10*3/uL — ABNORMAL LOW (ref 145–400)
RBC: 2.99 10*6/uL — AB (ref 3.70–5.45)
RDW: 19.4 % — ABNORMAL HIGH (ref 11.2–14.5)
WBC: 5.4 10*3/uL (ref 3.9–10.3)

## 2013-07-31 MED ORDER — SODIUM CHLORIDE 0.9 % IJ SOLN
10.0000 mL | INTRAMUSCULAR | Status: DC | PRN
  Administered 2013-07-31: 10 mL via INTRAVENOUS
  Filled 2013-07-31: qty 10

## 2013-07-31 MED ORDER — HEPARIN SOD (PORK) LOCK FLUSH 100 UNIT/ML IV SOLN
500.0000 [IU] | Freq: Once | INTRAVENOUS | Status: AC
  Administered 2013-07-31: 250 [IU] via INTRAVENOUS
  Filled 2013-07-31: qty 5

## 2013-07-31 NOTE — Patient Instructions (Signed)
Take Gabapentin 200mg  three times a day.  We will see you back next week.  Please call us if you have any questions or concerns.

## 2013-07-31 NOTE — Progress Notes (Signed)
Hematology and Oncology Follow Up Visit  [name] [id number] 1965/10/08 48 y.o. 08/01/2013 9:40 AM     Principle Diagnosis:[name] 48 y.o. female with recurrent endometrial carcinoma.     Prior Therapy: #1 Patient developed excessive uterine bleeding. She underwent dilatation and curettage with hysteroscopy. 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 Due to morbid obesity Mirena IUD was placed along with Aygestin. Patient had a dramatic directed weight loss with nutritional support she went from 523 pounds to 359 pounds in 10/2009. On 12/27/2009 she underwent a robotic-assisted laparoscopic hysterectomy, bilateral salpingo-oophorectomy, and mini laparotomy through the umbilical port with morcellation of uterus within about the delivery of the uterus. The final pathology revealed an endometrial adenocarcinoma grade 2 with invasion limited to 1 mm of the myometrium.   #2 Patient continued to do well until 09/2012 when she developed right upper quadrant pain. It was presumed to be cholelithiasis. 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 Omental biopsies were obtained. The pathology was consistent with metastatic adenocarcinoma with the presumption of recurrent endometrial carcinoma. She was seen by Dr. Janie Morning on 10/07/2012. Dr. Skeet Latch recommended that Ms. Hillis undergo chemotherapy consisting of Taxol and Carboplatinum. Chemotherapy started on 11/04/2012.   #3 Abdominal ascites with multiple paracenteses.   #4 Back pain and subsequent MRI on 02/05/2013 revealed compression fractures of T10 and T11.   #5 Status post kyphoplasty with Dr. Luanne Bras on 02/26/2013. Patient states her back pain continues.   #6 Patient hospitalized and underwent more MRI's on 12/4 and L1 and L2 compression fractures were noted, after  consultation with Dr. Sherwood Gambler, the patient was referred back to interventional radiology for evaluation for kyphoplasty and this was performed on 04/24/13.   #7 Patient diagnosed with right upper extremity DVT on 04/13/13. She was initially admitted to the hospital and started on a heparin gtt to remove her port. She was then transitioned to Enoxaparin BID.     Current therapy:  Taxol/Carboplatin cycle 12 day 8  Interim History: [name] 47 y.o. female with recurrent endometrial carcinoma is here for f/u after receiving cycle 12 of Taxol Carbo.  She is accompanied by her mother and aunt today.  She is in a wheelchair, which is no different from how she always comes in.  She did experience nausea as she usually does after treatment that is beginning to resolve.  She has drank more water with this cycle than cycles prior.  She does not feel dehydrated.  She continues to have numbness in her toes and mild intermittent numbness in her fingertips.  She is taking Gabapentin 100mg  TID and tolerating it well.  She does continue to have pain in her legs and takes Oxycodone and Flexeril for this at night.  Otherwise, she is feeling generally well today and a 10 point ROS is otherwise neg.   Medications:  Current Outpatient Prescriptions  Medication Sig Dispense Refill  . Ascorbic Acid (VITAMIN C) 1000 MG tablet Take 1,000 mg by mouth daily.      Marland Kitchen b complex vitamins capsule Take 1 capsule by mouth daily.      . Cyanocobalamin (VITAMIN B-12 PO) Take 2,500 mg by mouth daily.       . cyclobenzaprine (FLEXERIL) 5 MG tablet TAKE 1 TABLET BY MOUTH 3 TIMES A DAY AS NEEDED FOR MUSCLE SPASMS  60 tablet  1  .  enoxaparin (LOVENOX) 150 MG/ML injection INJECT 1 SYRINGE EVERY 12 HOURS  60 mL  3  . ergocalciferol (VITAMIN D2) 50000 UNITS capsule Take 1 capsule (50,000 Units total) by mouth once a week.  4 capsule  5  . ferrous sulfate 325 (65 FE) MG tablet TAKE 1 TABLET BY MOUTH 3 TIMES A DAY WITH MEALS  90 tablet  2   . folic acid (FOLVITE) 272 MCG tablet Take 400 mcg by mouth daily.        . furosemide (LASIX) 20 MG tablet Take 1 tablet (20 mg total) by mouth every other day.  30 tablet  0  . gabapentin (NEURONTIN) 100 MG capsule Take 1 capsule (100 mg total) by mouth 3 (three) times daily.  90 capsule  0  . Heparin Lock Flush (HEPARIN FLUSH, PORCINE,) 100 UNIT/ML injection Flush each lumen with 250 units daily  25 Syringe  0  . loratadine (CLARITIN) 10 MG tablet Take 10 mg by mouth daily.      . magnesium gluconate (MAGONATE) 500 MG tablet Take 500 mg by mouth daily.      . miconazole nitrate (MICATIN) POWD Apply 1 application topically 4 (four) times daily.  10 g  2  . Multiple Vitamins-Minerals (MULTIVITAMIN WITH MINERALS) tablet Take 1 tablet by mouth daily.       Marland Kitchen nystatin ointment (MYCOSTATIN) Apply 1 application topically 2 (two) times daily.      . ondansetron (ZOFRAN ODT) 8 MG disintegrating tablet Take 1 tablet (8 mg total) by mouth every 8 (eight) hours as needed for nausea or vomiting.  20 tablet  0  . ondansetron (ZOFRAN) 8 MG tablet TAKE 1 TABLET BY MOUTH TWICE A DAY AS NEEDED START DAY AFTER CHEMO  20 tablet  1  . oxyCODONE-acetaminophen (PERCOCET) 10-325 MG per tablet Take 1 tablet by mouth every 4 (four) hours as needed for pain.  90 tablet  0  . PACLitaxel (TAXOL IV) Inject 1 each into the vein every 21 ( twenty-one) days.      . pantoprazole (PROTONIX) 40 MG tablet Take 40 mg by mouth daily.      . pegfilgrastim (NEULASTA) 6 MG/0.6ML injection Inject 6 mg into the skin every 21 ( twenty-one) days.      . potassium chloride (K-DUR) 10 MEQ tablet Take 2 tablets (20 mEq total) by mouth daily.  14 tablet  0  . PRESCRIPTION MEDICATION every 21 ( twenty-one) days. CHEMOTHERAPY REGIMEN      . promethazine (PHENERGAN) 25 MG tablet TAKE 1/2 TABLET BY MOUTH EVERY 6 HOURS PRN FOR NAUSEA.  15 tablet  1  . Sodium Chloride Flush (NORMAL SALINE FLUSH) 0.9 % SOLN Inject 10 mLs into the vein 2 (two) times  a week.  50 Syringe  0  . sucralfate (CARAFATE) 1 GM/10ML suspension TAKE 10 MLS BY MOUTH 4 TIMES A DAY  420 mL  2  . prochlorperazine (COMPAZINE) 10 MG tablet TAKE 1 TABLET BY MOUTH EVERY 6 HOURS AS NEEDED FOR NAUSEA AND VOMITING  30 tablet  1   Current Facility-Administered Medications  Medication Dose Route Frequency Provider Last Rate Last Dose  . sodium chloride 0.9 % injection 10 mL  10 mL Intravenous PRN Minette Headland, NP   10 mL at 07/31/13 1243     Allergies: No Known Allergies  Medical History: Past Medical History  Diagnosis Date  . Obesity   . DVT (deep venous thrombosis) 2009    RLE, tx for ~6 months  .  PONV (postoperative nausea and vomiting)   . Swelling     BOTH LEGS  . Rash     LOWER ABD  . Gallstones   . Ascites   . Uterine cancer     Dr. Humphrey Rolls, chemotherapy  . Family history of anesthesia complication     MOTHER IS DIFFICULT TO WAKE  . Gallstones   . Cellulitis of leg, right     Surgical History:  Past Surgical History  Procedure Laterality Date  . Ankle surgery  1990  . Wisdom tooth extraction    . Tonsillectomy and adenoidectomy    . Abdominal hysterectomy  12/2009    [facility], [facility]  . Laparoscopy N/A 10/02/2012    Procedure: LAPAROSCOPY DIAGNOSTIC, perocentisis, omental biopsy;  Surgeon: Imogene Burn. Tsuei, MD;  Location: [facility];  Service: General;  Laterality: N/A;  removed a total of 15,000 of acities  . Paracentesis    . Kyphoplasty  02/26/2013     Review of Systems: A 10 point review of systems was conducted and is otherwise negative except for what is noted above.    Physical Exam: Blood pressure 113/74, pulse 103, temperature 98 F (36.7 C), temperature source Oral, resp. rate 20, height 5\' 11"  (1.803 m), weight 392 lb 12.8 oz (178.173 kg). EXAM LIMITED DUE TO PATIENTS INABILITY TO GET ON EXAM TABLE GENERAL: Patient is a morbidly obese female in no acute distress sitting in wheelchair HEENT:  Sclerae anicteric.  Oropharynx clear and moist.  No ulcerations or evidence of oropharyngeal candidiasis. Neck is supple.  NODES:  No cervical, supraclavicular, or axillary lymphadenopathy palpated.  BREAST EXAM:  Deferred. LUNGS:  Clear to auscultation bilaterally.  No wheezes or rhonchi. HEART:  Regular rate and rhythm. No murmur appreciated. ABDOMEN:  Soft, nontender.  Positive, normoactive bowel sounds. No organomegaly palpated. LIMITED DUE TO BODY HABITUS EXTREMITIES:  No peripheral edema.   SKIN:  Clear with no obvious rashes or skin changes. No nail dyscrasia. NEURO:  Nonfocal. Well oriented.  Appropriate affect. ECOG PERFORMANCE STATUS: 2 - Symptomatic, <50% confined to bed   Lab Results: Lab Results  Component Value Date   WBC 5.4 07/31/2013   HGB 10.1* 07/31/2013   HCT 30.2* 07/31/2013   MCV 101.0 07/31/2013   PLT 107* 07/31/2013     Chemistry      Component Value Date/Time   NA 140 07/31/2013 1126   NA 145 04/24/2013 0824   K 4.0 07/31/2013 1126   K 3.4* 04/24/2013 0824   CL 104 04/24/2013 0824   CL 98 10/07/2012 0923   CO2 30* 07/31/2013 1126   CO2 31 04/24/2013 0824   BUN 6.4* 07/31/2013 1126   BUN 6 04/24/2013 0824   CREATININE 0.7 07/31/2013 1126   CREATININE 0.47* 04/24/2013 0824      Component Value Date/Time   CALCIUM 9.8 07/31/2013 1126   CALCIUM 8.1* 04/24/2013 0824   ALKPHOS 100 07/31/2013 1126   ALKPHOS 179* 03/12/2013 0845   AST 36* 07/31/2013 1126   AST 42* 03/12/2013 0845   ALT 48 07/31/2013 1126   ALT 53* 03/12/2013 0845   BILITOT 0.51 07/31/2013 1126   BILITOT 0.5 03/12/2013 0845        Assessment and Plan: Sharyne Peach 48 y.o. female with  1. Recurrent endometrial carcinoma.  See prior history above.  The patient is s/p 12 cycles of Taxol and Carbo and tolerated this relatively well.  Her CBC is stable.  I reviewed this with her in detail.  Her CMP is pending.  I ordered a repeat CT of the chest abdomen and pelvis to evaluate her response to the Taxol and Carbo chemotherapy.    2. Ascites.  The patient has  previously required paracenteses.  She has not required one in quite some time, and this is largely resolved.    3. Back pain.  The patient has previous compression fractures of T10, T11, L1 and L2.  She underwent kyphoplasty x 2 on 02/26/13 and 04/24/13.  Her back pain has improved greatly since this was performed.  She takes Percocet for this.    4 Right upper extremity DVT.  This was diagnosed on 04/13/13.  She was first started on a Heparin gtt, and was later transitioned to Lovenox after her right chest wall port was removed.  She continues to receive Lovenox and is tolerating it well.    5. Neuropathy.  This is stable from last week.  She will increase her Gabapentin 200mg  TID.  We will re-evaluate her neuropathy next week.    The patient will return in one week for labs and evaluation.  She, her mother, and aunt know to contact us in the interim for any questions or concerns in the interim.  We can certainly see her sooner if needed.    I spent 25 minutes counseling the patient face to face.  The total time spent in the appointment was 30 minutes.  Minette Headland, [place] [phone] 08/01/2013 9:40 AM

## 2013-08-03 ENCOUNTER — Ambulatory Visit (HOSPITAL_COMMUNITY)
Admission: RE | Admit: 2013-08-03 | Discharge: 2013-08-03 | Disposition: A | Discharge status: Home / Self Care | Payer: BC Managed Care – PPO | Source: Ambulatory Visit | Attending: Adult Health | Admitting: Adult Health

## 2013-08-03 ENCOUNTER — Other Ambulatory Visit: Payer: Self-pay | Admitting: Adult Health

## 2013-08-03 ENCOUNTER — Encounter (HOSPITAL_COMMUNITY): Payer: Self-pay

## 2013-08-03 DIAGNOSIS — K7689 Other specified diseases of liver: Secondary | ICD-10-CM | POA: Insufficient documentation

## 2013-08-03 DIAGNOSIS — C541 Malignant neoplasm of endometrium: Secondary | ICD-10-CM

## 2013-08-03 DIAGNOSIS — I82290 Acute embolism and thrombosis of other thoracic veins: Secondary | ICD-10-CM | POA: Insufficient documentation

## 2013-08-03 DIAGNOSIS — C549 Malignant neoplasm of corpus uteri, unspecified: Secondary | ICD-10-CM | POA: Insufficient documentation

## 2013-08-03 DIAGNOSIS — Z9221 Personal history of antineoplastic chemotherapy: Secondary | ICD-10-CM | POA: Insufficient documentation

## 2013-08-03 DIAGNOSIS — Z90710 Acquired absence of both cervix and uterus: Secondary | ICD-10-CM | POA: Insufficient documentation

## 2013-08-03 DIAGNOSIS — M8448XA Pathological fracture, other site, initial encounter for fracture: Secondary | ICD-10-CM | POA: Insufficient documentation

## 2013-08-03 MED ORDER — IOHEXOL 300 MG/ML  SOLN
125.0000 mL | Freq: Once | INTRAMUSCULAR | Status: AC | PRN
  Administered 2013-08-03: 125 mL via INTRAVENOUS

## 2013-08-04 ENCOUNTER — Telehealth: Payer: Self-pay | Admitting: Oncology

## 2013-08-04 NOTE — Telephone Encounter (Signed)
ct scan per 4/10 pof already done. no other orders.

## 2013-08-04 NOTE — Progress Notes (Signed)
Faxed orders to [facility] for patient.    Sent to scan.

## 2013-08-07 ENCOUNTER — Ambulatory Visit (HOSPITAL_BASED_OUTPATIENT_CLINIC_OR_DEPARTMENT_OTHER): Payer: BC Managed Care – PPO | Admitting: Oncology

## 2013-08-07 ENCOUNTER — Other Ambulatory Visit (HOSPITAL_BASED_OUTPATIENT_CLINIC_OR_DEPARTMENT_OTHER): Payer: BC Managed Care – PPO

## 2013-08-07 ENCOUNTER — Telehealth: Payer: Self-pay | Admitting: Oncology

## 2013-08-07 ENCOUNTER — Other Ambulatory Visit: Payer: Self-pay

## 2013-08-07 ENCOUNTER — Ambulatory Visit (HOSPITAL_BASED_OUTPATIENT_CLINIC_OR_DEPARTMENT_OTHER): Payer: BC Managed Care – PPO

## 2013-08-07 ENCOUNTER — Encounter: Payer: Self-pay | Admitting: Oncology

## 2013-08-07 VITALS — BP 102/70 | HR 96 | Temp 98.1°F | Resp 18 | Ht 71.0 in | Wt 399.1 lb

## 2013-08-07 DIAGNOSIS — C541 Malignant neoplasm of endometrium: Secondary | ICD-10-CM

## 2013-08-07 DIAGNOSIS — I82409 Acute embolism and thrombosis of unspecified deep veins of unspecified lower extremity: Secondary | ICD-10-CM

## 2013-08-07 DIAGNOSIS — C786 Secondary malignant neoplasm of retroperitoneum and peritoneum: Secondary | ICD-10-CM

## 2013-08-07 DIAGNOSIS — C549 Malignant neoplasm of corpus uteri, unspecified: Secondary | ICD-10-CM

## 2013-08-07 DIAGNOSIS — Z452 Encounter for adjustment and management of vascular access device: Secondary | ICD-10-CM

## 2013-08-07 DIAGNOSIS — R188 Other ascites: Secondary | ICD-10-CM

## 2013-08-07 DIAGNOSIS — M549 Dorsalgia, unspecified: Secondary | ICD-10-CM

## 2013-08-07 DIAGNOSIS — N63 Unspecified lump in unspecified breast: Secondary | ICD-10-CM

## 2013-08-07 LAB — CBC WITH DIFFERENTIAL/PLATELET
BASO%: 0.6 % (ref 0.0–2.0)
Basophils Absolute: 0 10*3/uL (ref 0.0–0.1)
EOS ABS: 0 10*3/uL (ref 0.0–0.5)
EOS%: 1.2 % (ref 0.0–7.0)
HCT: 28.8 % — ABNORMAL LOW (ref 34.8–46.6)
HGB: 9.5 g/dL — ABNORMAL LOW (ref 11.6–15.9)
LYMPH%: 33.4 % (ref 14.0–49.7)
MCH: 33.4 pg (ref 25.1–34.0)
MCHC: 33 g/dL (ref 31.5–36.0)
MCV: 101.5 fL — AB (ref 79.5–101.0)
MONO#: 0.3 10*3/uL (ref 0.1–0.9)
MONO%: 10.4 % (ref 0.0–14.0)
NEUT%: 54.4 % (ref 38.4–76.8)
NEUTROS ABS: 1.8 10*3/uL (ref 1.5–6.5)
PLATELETS: 125 10*3/uL — AB (ref 145–400)
RBC: 2.83 10*6/uL — ABNORMAL LOW (ref 3.70–5.45)
RDW: 20.3 % — ABNORMAL HIGH (ref 11.2–14.5)
WBC: 3.2 10*3/uL — ABNORMAL LOW (ref 3.9–10.3)
lymph#: 1.1 10*3/uL (ref 0.9–3.3)

## 2013-08-07 LAB — COMPREHENSIVE METABOLIC PANEL (CC13)
ALBUMIN: 3.1 g/dL — AB (ref 3.5–5.0)
ALK PHOS: 70 U/L (ref 40–150)
ALT: 31 U/L (ref 0–55)
ANION GAP: 11 meq/L (ref 3–11)
AST: 26 U/L (ref 5–34)
BUN: 8.1 mg/dL (ref 7.0–26.0)
CO2: 28 meq/L (ref 22–29)
Calcium: 9.5 mg/dL (ref 8.4–10.4)
Chloride: 104 mEq/L (ref 98–109)
Creatinine: 0.6 mg/dL (ref 0.6–1.1)
Glucose: 105 mg/dl (ref 70–140)
Potassium: 3.7 mEq/L (ref 3.5–5.1)
Sodium: 143 mEq/L (ref 136–145)
Total Bilirubin: 0.48 mg/dL (ref 0.20–1.20)
Total Protein: 6.8 g/dL (ref 6.4–8.3)

## 2013-08-07 MED ORDER — SODIUM CHLORIDE 0.9 % IJ SOLN
10.0000 mL | INTRAMUSCULAR | Status: DC | PRN
  Administered 2013-08-07: 10 mL via INTRAVENOUS
  Filled 2013-08-07: qty 10

## 2013-08-07 MED ORDER — HEPARIN SOD (PORK) LOCK FLUSH 100 UNIT/ML IV SOLN
500.0000 [IU] | Freq: Once | INTRAVENOUS | Status: AC
  Administered 2013-08-07: 250 [IU] via INTRAVENOUS
  Filled 2013-08-07: qty 5

## 2013-08-07 NOTE — Progress Notes (Signed)
Per KK, schedule for 930 on 4/24.

## 2013-08-07 NOTE — Telephone Encounter (Signed)
sent inbox message to [facility] as there are no slots for 4/24. Also, pt req to cancel 4/24 chemo. Awaitting response

## 2013-08-11 ENCOUNTER — Telehealth: Payer: Self-pay | Admitting: *Deleted

## 2013-08-11 NOTE — Telephone Encounter (Signed)
Called and spoke with pt's mother Tamela Oddi) and informed her about Dr. Humphrey Rolls and told her that she would be seeing Ned Card instead of Dr. Humphrey Rolls.  She is fine with that.  Changed providers in system.

## 2013-08-12 ENCOUNTER — Other Ambulatory Visit: Payer: Self-pay | Admitting: Oncology

## 2013-08-12 ENCOUNTER — Telehealth: Payer: Self-pay | Admitting: Oncology

## 2013-08-12 DIAGNOSIS — C549 Malignant neoplasm of corpus uteri, unspecified: Secondary | ICD-10-CM

## 2013-08-13 ENCOUNTER — Ambulatory Visit (HOSPITAL_BASED_OUTPATIENT_CLINIC_OR_DEPARTMENT_OTHER): Payer: BC Managed Care – PPO | Admitting: Oncology

## 2013-08-13 ENCOUNTER — Encounter: Payer: Self-pay | Admitting: Oncology

## 2013-08-13 ENCOUNTER — Other Ambulatory Visit: Payer: Self-pay | Admitting: Oncology

## 2013-08-13 ENCOUNTER — Other Ambulatory Visit: Payer: Self-pay | Admitting: *Deleted

## 2013-08-13 ENCOUNTER — Telehealth: Payer: Self-pay | Admitting: Oncology

## 2013-08-13 ENCOUNTER — Ambulatory Visit (HOSPITAL_BASED_OUTPATIENT_CLINIC_OR_DEPARTMENT_OTHER): Payer: BC Managed Care – PPO

## 2013-08-13 VITALS — BP 105/69 | HR 94 | Temp 97.1°F | Resp 18 | Ht 71.0 in | Wt >= 6400 oz

## 2013-08-13 VITALS — BP 105/69 | HR 94 | Temp 97.0°F

## 2013-08-13 DIAGNOSIS — G629 Polyneuropathy, unspecified: Secondary | ICD-10-CM

## 2013-08-13 DIAGNOSIS — C549 Malignant neoplasm of corpus uteri, unspecified: Secondary | ICD-10-CM

## 2013-08-13 DIAGNOSIS — G620 Drug-induced polyneuropathy: Secondary | ICD-10-CM

## 2013-08-13 DIAGNOSIS — L0889 Other specified local infections of the skin and subcutaneous tissue: Secondary | ICD-10-CM

## 2013-08-13 DIAGNOSIS — C541 Malignant neoplasm of endometrium: Secondary | ICD-10-CM

## 2013-08-13 DIAGNOSIS — Z452 Encounter for adjustment and management of vascular access device: Secondary | ICD-10-CM

## 2013-08-13 LAB — CBC WITH DIFFERENTIAL/PLATELET
BASO%: 0.2 % (ref 0.0–2.0)
Basophils Absolute: 0 10*3/uL (ref 0.0–0.1)
EOS%: 0.3 % (ref 0.0–7.0)
Eosinophils Absolute: 0 10*3/uL (ref 0.0–0.5)
HCT: 31.8 % — ABNORMAL LOW (ref 34.8–46.6)
HGB: 10 g/dL — ABNORMAL LOW (ref 11.6–15.9)
LYMPH%: 21.9 % (ref 14.0–49.7)
MCH: 33.2 pg (ref 25.1–34.0)
MCHC: 31.4 g/dL — ABNORMAL LOW (ref 31.5–36.0)
MCV: 105.6 fL — ABNORMAL HIGH (ref 79.5–101.0)
MONO#: 0.5 10*3/uL (ref 0.1–0.9)
MONO%: 8.7 % (ref 0.0–14.0)
NEUT%: 68.9 % (ref 38.4–76.8)
NEUTROS ABS: 4 10*3/uL (ref 1.5–6.5)
PLATELETS: 151 10*3/uL (ref 145–400)
RBC: 3.01 10*6/uL — AB (ref 3.70–5.45)
RDW: 20 % — AB (ref 11.2–14.5)
WBC: 5.8 10*3/uL (ref 3.9–10.3)
lymph#: 1.3 10*3/uL (ref 0.9–3.3)

## 2013-08-13 LAB — COMPREHENSIVE METABOLIC PANEL (CC13)
ALT: 36 U/L (ref 0–55)
AST: 32 U/L (ref 5–34)
Albumin: 3.2 g/dL — ABNORMAL LOW (ref 3.5–5.0)
Alkaline Phosphatase: 65 U/L (ref 40–150)
Anion Gap: 15 mEq/L — ABNORMAL HIGH (ref 3–11)
BUN: 8.7 mg/dL (ref 7.0–26.0)
CO2: 24 meq/L (ref 22–29)
Calcium: 9.6 mg/dL (ref 8.4–10.4)
Chloride: 103 mEq/L (ref 98–109)
Creatinine: 0.7 mg/dL (ref 0.6–1.1)
Glucose: 111 mg/dl (ref 70–140)
POTASSIUM: 3.9 meq/L (ref 3.5–5.1)
SODIUM: 142 meq/L (ref 136–145)
TOTAL PROTEIN: 6.9 g/dL (ref 6.4–8.3)
Total Bilirubin: 0.5 mg/dL (ref 0.20–1.20)

## 2013-08-13 MED ORDER — GABAPENTIN 100 MG PO CAPS: 200.0000 mg | ORAL_CAPSULE | Freq: Three times a day (TID) | ORAL | Status: DC

## 2013-08-13 MED ORDER — SODIUM CHLORIDE 0.9 % IJ SOLN
10.0000 mL | Freq: Once | INTRAMUSCULAR | Status: AC
  Administered 2013-08-13: 10 mL via INTRAVENOUS
  Filled 2013-08-13: qty 10

## 2013-08-13 MED ORDER — CEPHALEXIN 500 MG PO CAPS: 500.0000 mg | ORAL_CAPSULE | Freq: Four times a day (QID) | ORAL | Status: DC

## 2013-08-13 MED ORDER — HEPARIN SOD (PORK) LOCK FLUSH 100 UNIT/ML IV SOLN
250.0000 [IU] | Freq: Once | INTRAVENOUS | Status: AC
  Administered 2013-08-13: 250 [IU] via INTRAVENOUS
  Filled 2013-08-13: qty 5

## 2013-08-13 NOTE — Patient Instructions (Signed)
Call Dr Mariana Kaufman RN or Mendel Ryder ~ Wed 4-29 [phone]) to let us know how [place]'s ear is doing, or certainly before then if any worse. We will start Keflex antibiotic 500 mg every 6 hrs but may need to change this depending on the culture result. Continue nystatin ointment, leave open to air as you can. Call if fever >=100.5

## 2013-08-13 NOTE — Patient Instructions (Signed)
PICC Home Guide A peripherally inserted central catheter (PICC) is a long, thin, flexible tube that is inserted into a vein in the upper arm. It is a form of intravenous (IV) access. It is considered to be a "central" line because the tip of the PICC ends in a large vein in your chest. This large vein is called the superior vena cava (SVC). The PICC tip ends in the SVC because there is a lot of blood flow in the SVC. This allows medicines and IV fluids to be quickly distributed throughout the body. The PICC is inserted using a sterile technique by a specially trained nurse or physician. After the PICC is inserted, a chest X-ray exam is done to be sure it is in the correct place.  A PICC may be placed for different reasons, such as:  To give medicines and liquid nutrition that can only be given through a central line. Examples are:  Certain antibiotic treatments.  Chemotherapy.  Total parenteral nutrition (TPN).  To take frequent blood samples.  To give IV fluids and blood products.  If there is difficulty placing a peripheral intravenous (PIV) catheter. If taken care of properly, a PICC can remain in place for several months. A PICC can also allow a person to go home from the hospital early. Medicine and PICC care can be managed at home by a family member or home health care team. WHAT PROBLEMS CAN HAPPEN WHEN I HAVE A PICC? Problems with a PICC can occasionally occur. These may include:  A blood clot (thrombus) forming in or at the tip of the PICC. This can cause the PICC to become clogged. A clot-dissolving medicine called tissue plasminogen activator (tPA) can be given through the PICC to help break up the clot.  Inflammation of the vein (phlebitis) in which the PICC is placed. Signs of inflammation may include redness, pain at the insertion site, red streaks, or being able to feel a "cord" in the vein where the PICC is located.  Infection in the PICC or at the insertion site. Signs of  infection may include fever, chills, redness, swelling, or pus drainage from the PICC insertion site.  PICC movement (malposition). The PICC tip may move from its original position due to excessive physical activity, forceful coughing, sneezing, or vomiting.  A break or cut in the PICC. It is important to not use scissors near the PICC.  Nerve or tendon irritation or injury during PICC insertion. WHAT SHOULD I KEEP IN MIND ABOUT ACTIVITIES WHEN I HAVE A PICC?  You may bend your arm and move it freely. If your PICC is near or at the bend of your elbow, avoid activity with repeated motion at the elbow.  Rest at home for the remainder of the day following PICC line insertion.  Avoid lifting heavy objects as instructed by your health care provider.  Avoid using a crutch with the arm on the same side as your PICC. You may need to use a walker. WHAT SHOULD I KNOW ABOUT MY PICC DRESSING?  Keep your PICC bandage (dressing) clean and dry to prevent infection.  Ask your health care provider when you may shower. Ask your health care provider to teach you how to wrap the PICC when you do take a shower.  Change the PICC dressing as instructed by your health care provider.  Change your PICC dressing if it becomes loose or wet. WHAT SHOULD I KNOW ABOUT PICC CARE?  Check the PICC insertion site daily for   leakage, redness, swelling, or pain.  Do not take a bath, swim, or use hot tubs when you have a PICC. Cover PICC line with clear plastic wrap and tape to keep it dry while showering.  Flush the PICC as directed by your health care provider. Let your health care provider know right away if the PICC is difficult to flush or does not flush. Do not use force to flush the PICC.  Do not use a syringe that is less than 10 mL to flush the PICC.  Never pull or tug on the PICC.  Avoid blood pressure checks on the arm with the PICC.  Keep your PICC identification card with you at all times.  Do not  take the PICC out yourself. Only a trained clinical professional should remove the PICC. SEEK IMMEDIATE MEDICAL CARE IF:  Your PICC is accidently pulled all the way out. If this happens, cover the insertion site with a bandage or gauze dressing. Do not throw the PICC away. Your health care provider will need to inspect it.  Your PICC was tugged or pulled and has partially come out. Do not  push the PICC back in.  There is any type of drainage, redness, or swelling where the PICC enters the skin.  You cannot flush the PICC, it is difficult to flush, or the PICC leaks around the insertion site when it is flushed.  You hear a "flushing" sound when the PICC is flushed.  You have pain, discomfort, or numbness in your arm, shoulder, or jaw on the same side as the PICC.  You feel your heart "racing" or skipping beats.  You notice a hole or tear in the PICC.  You develop chills or a fever. MAKE SURE YOU:   Understand these instructions.  Will watch your condition.  Will get help right away if you are not doing well or get worse. Document Released: 10/14/2002 Document Revised: 01/28/2013 Document Reviewed: 12/15/2012 [employer] Patient Information 2014 [employer].  

## 2013-08-13 NOTE — Telephone Encounter (Signed)
gv mom appt schedule for april.

## 2013-08-13 NOTE — Progress Notes (Signed)
OFFICE PROGRESS NOTE   08/13/2013   Physicians: K.Khan, W.Brewster, Orpah Melter, M.Tsuei  INTERVAL HISTORY:  Patient is seen in Dr Laurelyn Sickle absence, together with mother and aunt, in follow up of metastatic endometrial carcinoma, for which she has received 12 cycles of taxol and carboplatin from 11-04-12 thru 07-24-13 (with neulasta day 2). As this is the first time that I have met [name], I have reviewed multiple recent notes, chemotherapy flowsheets and CT AP from 08-03-13; I have also discussed directly with Dr Skeet Latch today. Course has been complicated by thoracic compression fractures 01-2013 sustained in a fall, with kyphoplasty 02-2013. She had RUE DVT 03-2013 related to PAC, the port removed, PICC placed and patient maintained on bid lovenox since then; mother administers lovenox injections and flushes PICC daily, with dressing changes at [facility] weekly. Primary concern of patient and mother today is apparent infection right pinna, which has worsened in past week despite topical nystatin ointment. The ear is more erythematous and tender, with small ulcerated area. She has had no fever.  Otherwise, patient and mother report nausea and occasional vomiting x several days after each chemotherapy, with phenergan especially helpful tho they use this only for severe nausea (does not make her particularly drowsy); compazine is also helpful and she often takes this bid even out 2-3 weeks from treatment, and zofran helpful but not used regularly. She often cannot eat any solid food for several days after chemo, but does push fluids. She has significant taxol &/or neulasta aches in legs x several days after chemo, improved with prn pain medication. She has numbness in toes bilaterally to point that she cannot feel her toes move, but none in hands. Bowels move usually daily. She is ambulatory with rolling walker and assistance short distances at home, pleased that she can now get to table for meals. She is  able to sleep. Back pain is much better than initially. She denies bleeding. As most recent CT done after cycle 12 carbo taxol shows stable to minimally improved pelvic involvement, Dr Humphrey Rolls and Dr Skeet Latch were in agreement with continuing treatment. After history and exam now, and review with Dr Skeet Latch, we have discussed using single agent carboplatin for now.   ONCOLOGIC HISTORY #1 Patient developed excessive uterine bleeding. Dilatation and curettage with hysteroscopy documented grade 1 endometrioid endometrial adenocarcinoma. Due to morbid obesity Mirena IUD was placed along with Aygestin. Patient had a dramatic directed weight loss with nutritional support she went from 523 pounds to 359 pounds in 10/2009. On 12/27/2009 she underwent a robotic-assisted laparoscopic hysterectomy, bilateral salpingo-oophorectomy, and mini laparotomy through the umbilical port with morcellation of uterus within about the delivery of the uterus. The final pathology revealed an endometrial adenocarcinoma grade 2 with invasion limited to 1 mm of the myometrium.  #2 Patient did well until 09/2012 when she developed right upper quadrant pain, presumed cholelithiasis. 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 Omental biopsies had metastatic adenocarcinoma consistent with recurrent endometrial carcinoma. Dr. Skeet Latch recommended Taxol and Carboplatinum, cycle 1 on 11/04/2012.  #3 Abdominal ascites with multiple paracenteses.  #4 Back pain and subsequent MRI on 02/05/2013 revealed compression fractures of T10 and T11. Kyphoplasty 02/26/2013. #6 Patient hospitalized and underwent more MRI's on 12/4 and L1 and L2 compression fractures were noted, after consultation with Dr. Sherwood Gambler, the patient was referred back to interventional radiology for evaluation for kyphoplasty and this was performed on 04/24/13.  #6. Right upper extremity DVT 04/13/13  related to  [facility], which was removed. Continues enoxaparin BID.     Review of systems as above, also: Voiding ok. Not SOB at rest, no cough. No LE swelling. No problems with PICC. No other skin lesions. Firm areas at lovenox injections.  Remainder of 10 point Review of Systems negative.  She had home PT after compression fractures/ kyphoplasty. She continues to follow PT instructions.  Objective:  Vital signs in last 24 hours:  BP 105/69  Pulse 94  Temp(Src) 97.1 F (36.2 C) (Oral)  Resp 18  Ht _0  (1.803 m)  Wt 400 lb 8 oz (181.666 kg)  BMI 55.88 kg/m2 Weight fluctuates some but generally in this range recently.  Chronically ill appearing, morbidly obese lady in wheelchair. Pale, not icteric. Respirations not labored RA. Able to reposition in [facility] without assistance. Looks generally weak but NAD Alert, oriented and appropriate, able to give history. Family very supportive, pleasant interactions.   HEENT:PERRL, sclerae not icteric. Oral mucosa moist without lesions, posterior pharynx clear. Alopecia. Right pinna with 3x4 mm superficial ulceration laterally without drainage, surrounding erythema and puffiness over 3-4 cm, slightly tender Neck supple. No JVD.  Lymphatics:no cervical,suraclavicular adenopathy Resp: clear to auscultation bilaterally and normal percussion bilaterally, diminished BS lower fields consistent with habitus. Cardio: regular rate and rhythm. No gallop. GI: abdomen obese, soft, nontender, not obviously distended, no appreciable mass or organomegaly. A few bowel sounds. Areas of SQ thickening up to 5 cm bilaterally in lower panus, not tender, some discoloration but not erythema or heat, these in areas of the lovenox injections. Musculoskeletal/ Extremities: without pitting edema, cords, tenderness UE or LE. Venous stasis changes bilat lower legs. Neuro: peripheral neuropathy involving toes bilaterally. Otherwise nonfocal on exam just in WC. Skin without rash, ecchymosis,  petechiae PICC LUE without erythema or tenderness, dressing changed today.  Lab Results:  Results for orders placed in visit on 08/13/13  CBC WITH DIFFERENTIAL      Result Value Ref Range   WBC 5.8  3.9 - 10.3 10e3/uL   NEUT# 4.0  1.5 - 6.5 10e3/uL   HGB 10.0 (*) 11.6 - 15.9 g/dL   HCT 31.8 (*) 34.8 - 46.6 %   Platelets 151  145 - 400 10e3/uL   MCV 105.6 (*) 79.5 - 101.0 fL   MCH 33.2  25.1 - 34.0 pg   MCHC 31.4 (*) 31.5 - 36.0 g/dL   RBC 3.01 (*) 3.70 - 5.45 10e6/uL   RDW 20.0 (*) 11.2 - 14.5 %   lymph# 1.3  0.9 - 3.3 10e3/uL   MONO# 0.5  0.1 - 0.9 10e3/uL   Eosinophils Absolute 0.0  0.0 - 0.5 10e3/uL   Basophils Absolute 0.0  0.0 - 0.1 10e3/uL   NEUT% 68.9  38.4 - 76.8 %   LYMPH% 21.9  14.0 - 49.7 %   MONO% 8.7  0.0 - 14.0 %   EOS% 0.3  0.0 - 7.0 %   BASO% 0.2  0.0 - 2.0 %  COMPREHENSIVE METABOLIC PANEL (UX32)      Result Value Ref Range   Sodium 142  136 - 145 mEq/L   Potassium 3.9  3.5 - 5.1 mEq/L   Chloride 103  98 - 109 mEq/L   CO2 24  22 - 29 mEq/L   Glucose 111  70 - 140 mg/dl   BUN 8.7  7.0 - 26.0 mg/dL   Creatinine 0.7  0.6 - 1.1 mg/dL   Total Bilirubin 0.50  0.20 - 1.20 mg/dL  Alkaline Phosphatase 65  40 - 150 U/L   AST 32  5 - 34 U/L   ALT 36  0 - 55 U/L   Total Protein 6.9  6.4 - 8.3 g/dL   Albumin 3.2 (*) 3.5 - 5.0 g/dL   Calcium 9.6  8.4 - 10.4 mg/dL   Anion Gap 15 (*) 3 - 11 mEq/L   Culture of the ulcerated area right ear done.  Studies/Results:  CT CHEST FINDINGS   08-03-13 Evaluation of lung parenchyma is constrained by respiratory motion.  Lungs are essentially clear. No suspicious pulmonary nodules. No  pleural effusion or pneumothorax.  Visualized thyroid is unremarkable.  Heart is normal size. No pericardial effusion.  Suspected left chest port terminating at the cavoatrial junction.  Persistent low density within the adjacent SVC (series 2/image 18),  suspicious for thrombus/fibrin sheath, likely mildly improved and  nonocclusive.  No  suspicious mediastinal, hilar, or axillary lymphadenopathy.  Degenerative changes of the thoracic spine. Prior vertebral  augmentation at T10-11. Right anterior 4th-6th rib fracture  deformities at the costochondral junction.  CT ABDOMEN AND PELVIS FINDINGS  Motion degraded images.  Hepatic steatosis with focal fatty sparing. Possible hypervascular  lesion in the anterior right hepatic dome measuring 13 mm (series  3/image 33), similar to 10/2012, indeterminate.  Spleen, pancreas, and adrenal glands are within normal limits.  Suspected layering gallstones and/or sludge (series 2/ image 71). No  intrahepatic or extrahepatic ductal dilatation.  Kidneys are grossly unremarkable. No hydronephrosis.  No evidence of a bowel obstruction. Suspected appendix is normal  (series 2/ image 94).  No evidence of abdominal aortic aneurysm.  No abdominopelvic ascites. Mild peritoneal/omental nodularity  beneath the anterior abdominal wall, measuring up to 13 x 17 mm  (series 2/image 80), previously 16 x 20 mm.  No suspicious abdominopelvic lymphadenopathy.  Status post hysterectomy. Left ovary is within normal limits. No  right adnexal mass.  Bladder is within normal limits.  Suspected subcutaneous injection sites in the anterior abdominal  wall.  Degenerative changes of the lumbar spine. Prior vertebral  augmentation at L1-2.  IMPRESSION:  Mild peritoneal disease, stable versus mildly improved. Prior  abdominopelvic ascites has resolved.  No evidence of metastatic disease in the chest.  Nonocclusive thrombus/fibrin sheath within the SVC adjacent to  indwelling chest port, likely mildly improved.  Additional stable ancillary findings as above    Medications: I have reviewed the patient's current medications. Lovenox dosing reviewed: as 1.5 mg/kg would still require 2 injections, no reason to change present lovenox one injection bid. If Arixtra is covered by insurance, that would be a single  injection once daily -- RN will speak with patient/ mother about Arixtra. Will begin Keflex 500 mg qid while awaiting culture result from right ear; they will continue nystatin ointment also. Will need to change to another antibiotic if culture should result with MRSA. Note gabapentin has helped discomfort from neuropathy and does not make her excessively drowsy.  Chemotherapy dosing discussed with Dr Skeet Latch, this problematic with weight 400 lbs.   DISCUSSION: given fact that she has had 12 cycles of the taxol + carboplatin and is experiencing progressive peripheral neuropathy in addition to other side effects, Dr Skeet Latch and I are in agreement that we try single agent carboplatin now. Will need to delay next treatment at least a week due to infection of external ear. Patient and mother understand and are in agreement with this plan.  Per my discussion with Dr Skeet Latch, patient and mother  have good understanding of severity of illness; Dr Skeet Latch had told mother that she expected this past Christmas would be patient's last. I did not try to address code status this visit, but do not find it mentioned in recent notes.  Assessment/Plan: 1.metastatic endometrial carcinoma with mostly pelvic involvement by last imaging, stable to slightly improved: will continue chemotherapy with carboplatin/ hold taxol q 3 weeks as tolerated, next treatment after MD visit 4-30 if ear improved and labs adequate. Note significant nausea with chemo, so will try to adjust antiemetics at visit next week; I have added EMEND to premeds for next treatment. I expect she will still need neulasta (which could be given as long as a week later if we want to coordinate with PICC dressing change and save another office trip.I did not discuss change in neulasta schedule today). 2.skin infection right pinna: culture pending to r/o MRSA. Begin Keflex awaiting culture. Family knows to call if worsens prior to next visit, otherwise will  let us know how this is mid next week 3.PICC in: mother flushes daily, dressing changes at [facility] weekly 4.morbid obesity 5.long term anticoagulation since PAC-associated DVT in Dec 2014. See discussion above re possible change to arixtra to allow single injection daily 6.T spine compression fractures Oct 2014 after fall, post kyphoplasties. 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 Hold taxol as above 9. 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 Need to clarify code status   Chemotherapy orders changed to single agent carboplatin (with skin test) Time spent 40+ min including >50% counseling and coordination of care. Patient and family had questions answered to their satisfaction and are in agreement with plan.  Gordy Levan, MD   08/13/2013, 8:39 PM

## 2013-08-14 ENCOUNTER — Ambulatory Visit: Payer: BC Managed Care – PPO

## 2013-08-14 ENCOUNTER — Ambulatory Visit: Payer: BC Managed Care – PPO | Admitting: Nurse Practitioner

## 2013-08-14 ENCOUNTER — Other Ambulatory Visit: Payer: BC Managed Care – PPO

## 2013-08-15 LAB — WOUND CULTURE

## 2013-08-18 ENCOUNTER — Telehealth: Payer: Self-pay

## 2013-08-18 DIAGNOSIS — I82629 Acute embolism and thrombosis of deep veins of unspecified upper extremity: Secondary | ICD-10-CM

## 2013-08-18 MED ORDER — FONDAPARINUX SODIUM 10 MG/0.8ML ~~LOC~~ SOLN: 10.0000 mg | SUBCUTANEOUS | Status: DC

## 2013-08-18 NOTE — Telephone Encounter (Signed)
Told mother the results of the  culture done on the right ear last week as noted below by Dr. Marko Plume.  Mother stated that the ear looks a lot better with Keflex antibiotic. Discussed the change to Arixtra  injections with mother as noted below by Dr. Marko Plume. Called in prescription to [facility] and placed in on hold as she has 10 days left of her Lovenox injections.  Cost is the same as Lovenox $4.00 co pay.

## 2013-08-18 NOTE — Telephone Encounter (Signed)
          Patient Demographics     Patient Name Sex DOB SSN Address Contact Numbers    [name], [name] Female 16-Nov-1965 [id number] [place] [address] [phone]              Message Received: 5 days ago     Gordy Levan, MD Baruch Merl, RN; Patton Salles, RN            Please let patient/ mother know I thought of a way that we might be able to change the blood thinner shots to one shot once daily (presently bid lovenox). We would need to change to a different shot called Arixtra, but it would be one shot daily and no more volume per injection than what she is taking now. If they want, we could have her pharmacy run script for Arixtra 10 mg daily to see if insurance will cover, #30 2 RF if so. If they have a lot of lovenox on hand, we can remember this and try it when next prescription needed.

## 2013-08-18 NOTE — Telephone Encounter (Signed)
Message copied by Baruch Merl on Tue Aug 18, 2013 12:16 PM ------      Message from: Evlyn Clines P      Created: Mon Aug 17, 2013 10:30 AM       Labs seen and need follow up please let patient/ mother know we did not find MRSA or other infection from skin culture. How is her ear doing with the Keflex antibiotic? ------

## 2013-08-20 ENCOUNTER — Encounter: Payer: Self-pay | Admitting: Oncology

## 2013-08-20 ENCOUNTER — Telehealth: Payer: Self-pay | Admitting: *Deleted

## 2013-08-20 ENCOUNTER — Ambulatory Visit (HOSPITAL_BASED_OUTPATIENT_CLINIC_OR_DEPARTMENT_OTHER): Payer: BC Managed Care – PPO

## 2013-08-20 ENCOUNTER — Other Ambulatory Visit: Payer: BC Managed Care – PPO

## 2013-08-20 ENCOUNTER — Ambulatory Visit (HOSPITAL_BASED_OUTPATIENT_CLINIC_OR_DEPARTMENT_OTHER): Payer: BC Managed Care – PPO | Admitting: Oncology

## 2013-08-20 ENCOUNTER — Other Ambulatory Visit: Payer: Self-pay | Admitting: Oncology

## 2013-08-20 ENCOUNTER — Other Ambulatory Visit: Payer: Self-pay | Admitting: *Deleted

## 2013-08-20 VITALS — BP 123/83 | HR 96 | Temp 97.9°F | Resp 20 | Ht 71.0 in | Wt 399.6 lb

## 2013-08-20 DIAGNOSIS — R1011 Right upper quadrant pain: Secondary | ICD-10-CM

## 2013-08-20 DIAGNOSIS — G609 Hereditary and idiopathic neuropathy, unspecified: Secondary | ICD-10-CM

## 2013-08-20 DIAGNOSIS — C779 Secondary and unspecified malignant neoplasm of lymph node, unspecified: Secondary | ICD-10-CM

## 2013-08-20 DIAGNOSIS — C549 Malignant neoplasm of corpus uteri, unspecified: Secondary | ICD-10-CM

## 2013-08-20 DIAGNOSIS — C541 Malignant neoplasm of endometrium: Secondary | ICD-10-CM

## 2013-08-20 DIAGNOSIS — A4902 Methicillin resistant Staphylococcus aureus infection, unspecified site: Secondary | ICD-10-CM

## 2013-08-20 DIAGNOSIS — C50919 Malignant neoplasm of unspecified site of unspecified female breast: Secondary | ICD-10-CM

## 2013-08-20 DIAGNOSIS — Z5111 Encounter for antineoplastic chemotherapy: Secondary | ICD-10-CM

## 2013-08-20 DIAGNOSIS — I82629 Acute embolism and thrombosis of deep veins of unspecified upper extremity: Secondary | ICD-10-CM

## 2013-08-20 LAB — COMPREHENSIVE METABOLIC PANEL (CC13)
ALK PHOS: 68 U/L (ref 40–150)
ALT: 40 U/L (ref 0–55)
AST: 28 U/L (ref 5–34)
Albumin: 3.4 g/dL — ABNORMAL LOW (ref 3.5–5.0)
Anion Gap: 13 mEq/L — ABNORMAL HIGH (ref 3–11)
BUN: 9.5 mg/dL (ref 7.0–26.0)
CALCIUM: 10.2 mg/dL (ref 8.4–10.4)
CHLORIDE: 102 meq/L (ref 98–109)
CO2: 26 mEq/L (ref 22–29)
Creatinine: 0.8 mg/dL (ref 0.6–1.1)
Glucose: 118 mg/dl (ref 70–140)
Potassium: 3.6 mEq/L (ref 3.5–5.1)
Sodium: 142 mEq/L (ref 136–145)
Total Bilirubin: 0.47 mg/dL (ref 0.20–1.20)
Total Protein: 7.2 g/dL (ref 6.4–8.3)

## 2013-08-20 LAB — CBC WITH DIFFERENTIAL/PLATELET
BASO%: 0.2 % (ref 0.0–2.0)
BASOS ABS: 0 10*3/uL (ref 0.0–0.1)
EOS%: 1.9 % (ref 0.0–7.0)
Eosinophils Absolute: 0.1 10*3/uL (ref 0.0–0.5)
HCT: 33.7 % — ABNORMAL LOW (ref 34.8–46.6)
HGB: 11 g/dL — ABNORMAL LOW (ref 11.6–15.9)
LYMPH%: 25.1 % (ref 14.0–49.7)
MCH: 34.1 pg — AB (ref 25.1–34.0)
MCHC: 32.7 g/dL (ref 31.5–36.0)
MCV: 104.2 fL — AB (ref 79.5–101.0)
MONO#: 0.3 10*3/uL (ref 0.1–0.9)
MONO%: 6.8 % (ref 0.0–14.0)
NEUT#: 3.1 10*3/uL (ref 1.5–6.5)
NEUT%: 66 % (ref 38.4–76.8)
Platelets: 190 10*3/uL (ref 145–400)
RBC: 3.24 10*6/uL — ABNORMAL LOW (ref 3.70–5.45)
RDW: 21.4 % — AB (ref 11.2–14.5)
WBC: 4.7 10*3/uL (ref 3.9–10.3)
lymph#: 1.2 10*3/uL (ref 0.9–3.3)

## 2013-08-20 MED ORDER — CARBOPLATIN CHEMO INTRADERMAL TEST DOSE 100MCG/0.02ML
100.0000 ug | Freq: Once | INTRADERMAL | Status: AC
  Administered 2013-08-20: 100 ug via INTRADERMAL
  Filled 2013-08-20: qty 0.01

## 2013-08-20 MED ORDER — SODIUM CHLORIDE 0.9 % IV SOLN
900.0000 mg | Freq: Once | INTRAVENOUS | Status: AC
  Administered 2013-08-20: 900 mg via INTRAVENOUS
  Filled 2013-08-20: qty 90

## 2013-08-20 MED ORDER — SODIUM CHLORIDE 0.9 % IV SOLN
Freq: Once | INTRAVENOUS | Status: AC
  Administered 2013-08-20: 12:00:00 via INTRAVENOUS

## 2013-08-20 MED ORDER — ONDANSETRON 16 MG/50ML IVPB (CHCC)
INTRAVENOUS | Status: AC
  Filled 2013-08-20: qty 16

## 2013-08-20 MED ORDER — HEPARIN SOD (PORK) LOCK FLUSH 100 UNIT/ML IV SOLN
250.0000 [IU] | Freq: Once | INTRAVENOUS | Status: AC | PRN
  Administered 2013-08-20: 250 [IU]
  Filled 2013-08-20: qty 5

## 2013-08-20 MED ORDER — DEXAMETHASONE SODIUM PHOSPHATE 20 MG/5ML IJ SOLN
INTRAMUSCULAR | Status: AC
  Filled 2013-08-20: qty 5

## 2013-08-20 MED ORDER — DEXAMETHASONE SODIUM PHOSPHATE 20 MG/5ML IJ SOLN
12.0000 mg | Freq: Once | INTRAMUSCULAR | Status: AC
  Administered 2013-08-20: 12 mg via INTRAVENOUS

## 2013-08-20 MED ORDER — SODIUM CHLORIDE 0.9 % IJ SOLN
10.0000 mL | INTRAMUSCULAR | Status: DC | PRN
  Administered 2013-08-20: 10 mL
  Filled 2013-08-20: qty 10

## 2013-08-20 MED ORDER — OXYCODONE-ACETAMINOPHEN 5-325 MG PO TABS
2.0000 | ORAL_TABLET | Freq: Once | ORAL | Status: AC
  Administered 2013-08-20: 2 via ORAL

## 2013-08-20 MED ORDER — OXYCODONE-ACETAMINOPHEN 5-325 MG PO TABS
ORAL_TABLET | ORAL | Status: AC
  Filled 2013-08-20: qty 2

## 2013-08-20 MED ORDER — ENOXAPARIN SODIUM 150 MG/ML ~~LOC~~ SOLN
150.0000 mg | Freq: Once | SUBCUTANEOUS | Status: DC
  Administered 2013-08-20: 150 mg via SUBCUTANEOUS
  Filled 2013-08-20: qty 1

## 2013-08-20 MED ORDER — ONDANSETRON 16 MG/50ML IVPB (CHCC)
16.0000 mg | Freq: Once | INTRAVENOUS | Status: AC
  Administered 2013-08-20: 16 mg via INTRAVENOUS

## 2013-08-20 MED ORDER — CEPHALEXIN 500 MG PO CAPS: 500.0000 mg | ORAL_CAPSULE | Freq: Four times a day (QID) | ORAL | Status: DC

## 2013-08-20 MED ORDER — SODIUM CHLORIDE 0.9 % IV SOLN
150.0000 mg | Freq: Once | INTRAVENOUS | Status: AC
  Administered 2013-08-20: 150 mg via INTRAVENOUS
  Filled 2013-08-20: qty 5

## 2013-08-20 NOTE — Telephone Encounter (Signed)
Per staff message and POF I have scheduled appts.  JMW  

## 2013-08-20 NOTE — Patient Instructions (Signed)
Continue compazine twice daily thru ~ 5-1 evening.  Beginning 5-2 AM (Sat AM whether or not any nausea, or sooner if chemo nausea starts sooner), take zofran 8 mg every 12 hours. Also beginning at least Sat AM, even if no nausea Sat AM, start phenergan 1/2 - 1 of the 25 mg tablets every 6-8 hours for next few days until nausea improves. Do not take compazine and phenergan together. Can increase the zofran to every 8 hours if needed. Call Dr Marko Plume if this plan does not control the nausea, or if Sherriann cannot keep liquids down.  Continue another 3 days of Keflex  Change to once daily Arixtra blood thinner injections when you finish present lovenox

## 2013-08-20 NOTE — Patient Instructions (Signed)
[facility] Discharge Instructions for Patients Receiving Chemotherapy  Today you received the following chemotherapy agents:  Carboplatin  To help prevent nausea and vomiting after your treatment, we encourage you to take your nausea medication as ordered per MD.   If you develop nausea and vomiting that is not controlled by your nausea medication, call the clinic.   BELOW ARE SYMPTOMS THAT SHOULD BE REPORTED IMMEDIATELY:  *FEVER GREATER THAN 100.5 F  *CHILLS WITH OR WITHOUT FEVER  NAUSEA AND VOMITING THAT IS NOT CONTROLLED WITH YOUR NAUSEA MEDICATION  *UNUSUAL SHORTNESS OF BREATH  *UNUSUAL BRUISING OR BLEEDING  TENDERNESS IN MOUTH AND THROAT WITH OR WITHOUT PRESENCE OF ULCERS  *URINARY PROBLEMS  *BOWEL PROBLEMS  UNUSUAL RASH Items with * indicate a potential emergency and should be followed up as soon as possible.  Feel free to call the clinic you have any questions or concerns. The clinic phone number is [phone].    

## 2013-08-20 NOTE — Progress Notes (Signed)
OFFICE PROGRESS NOTE   08/20/2013   Physicians:K.Khan, W.Brewster, Orpah Melter, M.Tsuei   INTERVAL HISTORY:  Patient is seen in Dr Laurelyn Sickle absence, together with mother and aunt, in follow up of metastatic endometrial carcinoma, for which she has received 12 cycles of taxol and carboplatin from 11-04-12 thru 07-24-13 (with neulasta day 2). Planned cycle 13 was held last week due to infected right external ear, which has improved on Keflex (no MRSA by skin culture). Plan now, per my discussion with Dr Skeet Latch, is to treat with q 3 week single agent taxol due to extent of treatment and peripheral neuropathy. Her right ear is no longer painful; she has had no fever and is tolerating the keflex without difficulty. She does sleep on that side due to PICC on left, and part of the problem is likely pressure related. We have discussed adjusting pillow to avoid pressure to ear. She tried stopping bid compazine but had recurrent nausea off of this and has resumed. We have discussed trying more regular zofran and phenergan after chemo, as nausea usually begins day 3 and lasts for several days. She has been walking short distances in house. Dressing on PICC needs change. She has occasional squeezing pain in lower legs, not clearly muscle cramps, used pain pills x 2 for this in last 24 hours (~ usual). Bowels are moving daily.    ONCOLOGIC HISTORY 1 Patient developed excessive uterine bleeding. Dilatation and curettage with hysteroscopy documented grade 1 endometrioid endometrial adenocarcinoma. Due to morbid obesity Mirena IUD was placed along with Aygestin. Patient had a dramatic directed weight loss with nutritional support she went from 523 pounds to 359 pounds in 10/2009. On 12/27/2009 she underwent a robotic-assisted laparoscopic hysterectomy, bilateral salpingo-oophorectomy, and mini laparotomy through the umbilical port with morcellation of uterus within about the delivery of the uterus. The final  pathology revealed an endometrial adenocarcinoma grade 2 with invasion limited to 1 mm of the myometrium.  #2 Patient did well until 09/2012 when she developed right upper quadrant pain, presumed cholelithiasis. 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 Omental biopsies had metastatic adenocarcinoma consistent with recurrent endometrial carcinoma. She had 12 cycles taxol carboplatin from 11-04-12 thru 07-24-13 with neulasta day 2. CT AP after cycle 12 showed stable to minimally improved disease. With progressive neuropathy, plan is to continue single agent carboplatin for now. #3 Abdominal ascites with multiple paracenteses.  #4 Back pain and subsequent MRI on 02/05/2013 revealed compression fractures of T10 and T11. Kyphoplasty 02/26/2013.  #6 Patient hospitalized and underwent more MRI's on 12/4 and L1 and L2 compression fractures were noted, after consultation with Dr. Sherwood Gambler, the patient was referred back to interventional radiology for evaluation for kyphoplasty and this was performed on 04/24/13.  #6. Right upper extremity DVT 04/13/13 related to [facility], which was removed. Continues enoxaparin BID until she finishes present supply, then will change to Arixtra at once daily dosing as insurance copay is same.     Review of systems as above, also: Denies bleeding. No other problems with PICC. Lovenox injections in upper abdomen tolerated well. Remainder of 10 point Review of Systems negative.  Objective:  Vital signs in last 24 hours:  BP 123/83  Pulse 96  Temp(Src) 97.9 F (36.6 C) (Oral)  Resp 20  Ht 5\' 11"  (1.803 m)  Wt 399 lb 9.6 oz (181.257 kg)  BMI 55.76 kg/m2 weight is down 1 lb.   Alert, oriented and appropriate. Looks brighter  and a little more comfortable overall, seated in WC on RA. Family very supportive. Alopecia  HEENT:PERRL, sclerae not icteric. Oral mucosa moist without lesions, posterior pharynx  clear. Right external ear less puffy/ less bright erythema tho still slight red discoloration. Small superficial ulcer nearly closed. Ear not tender to palpation today. Neck supple. No JVD.  Lymphatics:no cervical,suraclavicular adenopathy Resp: clear to auscultation bilaterally and normal percussion bilaterally Cardio: regular rate and rhythm. No gallop. GI: obese, soft, nontender, not obviously distended, no mass or organomegaly appreciable. Some bowel sounds. Areas of lovenox injections lower panus thickened and discolored but not tender or hot. Musculoskeletal/ Extremities: without pitting edema, cords, tenderness. Venous stasis changes lower legs bilaterally Neuro: peripheral neuropathy feet. Otherwise nonfocal on exam just in [facility] Skin without rash, ecchymosis otherwise, petechiae PICC-without erythema or tenderness, dressing needs change today as tape pulling off  Lab Results:  Results for orders placed in visit on 08/20/13  CBC WITH DIFFERENTIAL      Result Value Ref Range   WBC 4.7  3.9 - 10.3 10e3/uL   NEUT# 3.1  1.5 - 6.5 10e3/uL   HGB 11.0 (*) 11.6 - 15.9 g/dL   HCT 33.7 (*) 34.8 - 46.6 %   Platelets 190  145 - 400 10e3/uL   MCV 104.2 (*) 79.5 - 101.0 fL   MCH 34.1 (*) 25.1 - 34.0 pg   MCHC 32.7  31.5 - 36.0 g/dL   RBC 3.24 (*) 3.70 - 5.45 10e6/uL   RDW 21.4 (*) 11.2 - 14.5 %   lymph# 1.2  0.9 - 3.3 10e3/uL   MONO# 0.3  0.1 - 0.9 10e3/uL   Eosinophils Absolute 0.1  0.0 - 0.5 10e3/uL   Basophils Absolute 0.0  0.0 - 0.1 10e3/uL   NEUT% 66.0  38.4 - 76.8 %   LYMPH% 25.1  14.0 - 49.7 %   MONO% 6.8  0.0 - 14.0 %   EOS% 1.9  0.0 - 7.0 %   BASO% 0.2  0.0 - 2.0 %  COMPREHENSIVE METABOLIC PANEL (HD62)      Result Value Ref Range   Sodium 142  136 - 145 mEq/L   Potassium 3.6  3.5 - 5.1 mEq/L   Chloride 102  98 - 109 mEq/L   CO2 26  22 - 29 mEq/L   Glucose 118  70 - 140 mg/dl   BUN 9.5  7.0 - 26.0 mg/dL   Creatinine 0.8  0.6 - 1.1 mg/dL   Total Bilirubin 0.47  0.20 - 1.20  mg/dL   Alkaline Phosphatase 68  40 - 150 U/L   AST 28  5 - 34 U/L   ALT 40  0 - 55 U/L   Total Protein 7.2  6.4 - 8.3 g/dL   Albumin 3.4 (*) 3.5 - 5.0 g/dL   Calcium 10.2  8.4 - 10.4 mg/dL   Anion Gap 13 (*) 3 - 11 mEq/L     Studies/Results:  No results found.  Medications: I have reviewed the patient's current medications and discussed changes in antiemetics due to significant nausea x several days after each chemo.  Written and oral instructions medication instructions given to patient and family:  Continue compazine twice daily thru ~ 5-1 evening.  Beginning 5-2 AM (Sat AM whether or not any nausea, or sooner if chemo nausea starts sooner), take zofran 8 mg every 12 hours. Also beginning at least Sat AM, even if no nausea Sat AM, start phenergan 1/2 - 1 of the 25 mg  tablets every 6-8 hours for next few days until nausea improves. Do not take compazine and phenergan together. Can increase the zofran to every 8 hours if needed. Call Dr Marko Plume if this plan does not control the nausea, or if Kimberl cannot keep liquids down.  Continue another 3 days of Keflex.Change to once daily Arixtra blood thinner injections when you finish present lovenox  DISCUSSION as above  Assessment/Plan: 1.metastatic endometrial carcinoma with mostly pelvic involvement by last imaging, stable to slightly improved: will continue chemotherapy with single agent carbo beginning today as long as carbo skin test is negative, and every 3 weeks.  Note significant nausea with chemo, so will try to adjust antiemetics; I have also added EMEND to premeds. Continue neulasta day 2. 2.skin infection right pinna: culture pending to r/o MRSA. Begin Keflex awaiting culture. Family knows to call if worsens prior to next visit, otherwise will let us know how this is mid next week  3.PICC in: mother flushes daily, dressing changes at [facility] weekly  4.morbid obesity  5.long term anticoagulation since PAC-associated DVT in Dec  2014.  change to arixtra to allow single injection daily  6.T spine compression fractures Oct 2014 after fall, post kyphoplasties. Pain gradually improved  7.Pressure sore right external ear with suprainfection: improving, continue Keflex for total 10 days and avoid pressure on ear when sleeps. 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 Hold taxol as above  9. 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 Need to clarify code status  12.GERD improved with protonix and carafate  Patient and family are in agreement with plan and have had all questions answered. I will see her ~ a week after treatment today.  Gordy Levan, MD   08/20/2013, 9:32 AM

## 2013-08-20 NOTE — Telephone Encounter (Signed)
Verbal refill order given to pharmacist for zofran and compazine

## 2013-08-20 NOTE — Progress Notes (Signed)
1115-PICC dressing changed to left ac.  Site pink without drainage to insertion site.  Dried crusted drainage underneath wings of PICC line.  Unable to remove all of crusted material d/t increased tenderness with cleaning site.  DSD applied.  Pt denies any pain or tenderness to PICC site.  1200-Pt complaining of aching pain to bilateral legs at a "8" out of 10.  Dr. Marko Plume notified and order received to give pt 2 Percocet 5/325 now.

## 2013-08-21 ENCOUNTER — Telehealth: Payer: Self-pay | Admitting: Oncology

## 2013-08-21 ENCOUNTER — Ambulatory Visit (HOSPITAL_BASED_OUTPATIENT_CLINIC_OR_DEPARTMENT_OTHER): Payer: BC Managed Care – PPO

## 2013-08-21 VITALS — BP 99/64 | HR 87 | Temp 97.8°F

## 2013-08-21 DIAGNOSIS — C50919 Malignant neoplasm of unspecified site of unspecified female breast: Secondary | ICD-10-CM

## 2013-08-21 DIAGNOSIS — C541 Malignant neoplasm of endometrium: Secondary | ICD-10-CM

## 2013-08-21 DIAGNOSIS — C549 Malignant neoplasm of corpus uteri, unspecified: Secondary | ICD-10-CM

## 2013-08-21 DIAGNOSIS — C779 Secondary and unspecified malignant neoplasm of lymph node, unspecified: Secondary | ICD-10-CM

## 2013-08-21 DIAGNOSIS — Z5189 Encounter for other specified aftercare: Secondary | ICD-10-CM

## 2013-08-21 MED ORDER — PEGFILGRASTIM INJECTION 6 MG/0.6ML
6.0000 mg | Freq: Once | SUBCUTANEOUS | Status: AC
  Administered 2013-08-21: 6 mg via SUBCUTANEOUS
  Filled 2013-08-21: qty 0.6

## 2013-08-21 NOTE — Telephone Encounter (Signed)
, °

## 2013-08-23 ENCOUNTER — Other Ambulatory Visit: Payer: Self-pay | Admitting: Oncology

## 2013-08-23 DIAGNOSIS — C541 Malignant neoplasm of endometrium: Secondary | ICD-10-CM

## 2013-08-27 ENCOUNTER — Encounter: Payer: Self-pay | Admitting: Oncology

## 2013-08-27 ENCOUNTER — Other Ambulatory Visit (HOSPITAL_BASED_OUTPATIENT_CLINIC_OR_DEPARTMENT_OTHER): Payer: BC Managed Care – PPO

## 2013-08-27 ENCOUNTER — Ambulatory Visit (HOSPITAL_BASED_OUTPATIENT_CLINIC_OR_DEPARTMENT_OTHER): Payer: BC Managed Care – PPO

## 2013-08-27 ENCOUNTER — Ambulatory Visit (HOSPITAL_BASED_OUTPATIENT_CLINIC_OR_DEPARTMENT_OTHER): Payer: BC Managed Care – PPO | Admitting: Oncology

## 2013-08-27 VITALS — BP 115/81 | HR 95 | Temp 97.5°F | Resp 18 | Ht 71.0 in | Wt 387.5 lb

## 2013-08-27 DIAGNOSIS — G620 Drug-induced polyneuropathy: Secondary | ICD-10-CM

## 2013-08-27 DIAGNOSIS — I82409 Acute embolism and thrombosis of unspecified deep veins of unspecified lower extremity: Secondary | ICD-10-CM

## 2013-08-27 DIAGNOSIS — C541 Malignant neoplasm of endometrium: Secondary | ICD-10-CM

## 2013-08-27 DIAGNOSIS — Z95828 Presence of other vascular implants and grafts: Secondary | ICD-10-CM

## 2013-08-27 DIAGNOSIS — C549 Malignant neoplasm of corpus uteri, unspecified: Secondary | ICD-10-CM

## 2013-08-27 DIAGNOSIS — C779 Secondary and unspecified malignant neoplasm of lymph node, unspecified: Secondary | ICD-10-CM

## 2013-08-27 DIAGNOSIS — K219 Gastro-esophageal reflux disease without esophagitis: Secondary | ICD-10-CM

## 2013-08-27 DIAGNOSIS — C50919 Malignant neoplasm of unspecified site of unspecified female breast: Secondary | ICD-10-CM

## 2013-08-27 LAB — CBC WITH DIFFERENTIAL/PLATELET
BASO%: 0.3 % (ref 0.0–2.0)
BASOS ABS: 0 10*3/uL (ref 0.0–0.1)
EOS%: 1 % (ref 0.0–7.0)
Eosinophils Absolute: 0.1 10*3/uL (ref 0.0–0.5)
HCT: 35.2 % (ref 34.8–46.6)
HEMOGLOBIN: 11.9 g/dL (ref 11.6–15.9)
LYMPH%: 18.8 % (ref 14.0–49.7)
MCH: 35 pg — AB (ref 25.1–34.0)
MCHC: 33.7 g/dL (ref 31.5–36.0)
MCV: 103.7 fL — AB (ref 79.5–101.0)
MONO#: 0.8 10*3/uL (ref 0.1–0.9)
MONO%: 8.1 % (ref 0.0–14.0)
NEUT%: 71.8 % (ref 38.4–76.8)
NEUTROS ABS: 7.3 10*3/uL — AB (ref 1.5–6.5)
PLATELETS: 180 10*3/uL (ref 145–400)
RBC: 3.39 10*6/uL — ABNORMAL LOW (ref 3.70–5.45)
RDW: 21 % — ABNORMAL HIGH (ref 11.2–14.5)
WBC: 10.2 10*3/uL (ref 3.9–10.3)
lymph#: 1.9 10*3/uL (ref 0.9–3.3)

## 2013-08-27 LAB — FERRITIN CHCC: FERRITIN: 775 ng/mL — AB (ref 9–269)

## 2013-08-27 LAB — VITAMIN B12

## 2013-08-27 LAB — IRON AND TIBC CHCC
%SAT: 24 % (ref 21–57)
Iron: 64 ug/dL (ref 41–142)
TIBC: 268 ug/dL (ref 236–444)
UIBC: 203 ug/dL (ref 120–384)

## 2013-08-27 MED ORDER — SODIUM CHLORIDE 0.9 % IJ SOLN
10.0000 mL | INTRAMUSCULAR | Status: DC | PRN
  Administered 2013-08-27: 10 mL via INTRAVENOUS
  Filled 2013-08-27: qty 10

## 2013-08-27 MED ORDER — HEPARIN SOD (PORK) LOCK FLUSH 100 UNIT/ML IV SOLN
500.0000 [IU] | Freq: Once | INTRAVENOUS | Status: AC
  Administered 2013-08-27: 250 [IU] via INTRAVENOUS
  Filled 2013-08-27: qty 5

## 2013-08-27 NOTE — Progress Notes (Signed)
OFFICE PROGRESS NOTE   08/27/2013   Physicians:K.Khan, W.Brewster, Orpah Melter, M.Tsuei   INTERVAL HISTORY:  Patient is seen, together with mother and aunt, in continuing attention to metastatic endometrial carcinoma, for which she continues chemotherapy in attempt to palliate and control disease.  Chemo was resumed on 08-20-13 after external ear infection resolved, now using single agent carboplatin every 3 weeks due to peripheral neuropathy. She had neulasta on 08-21-13. EMEND was added to antiemetics day of chemo due to severity of chemo nausea and vomiting previously.  Patient and family report that nausea was not as severe after most recent treatment, tho still very symptomatic days 2-3-4, with vomiting days 3 and 4 especially with activity (such as going from bed to BR during night). She used phenergan regularly bid with compazine prn not closer than 6 hours to the phenergan. She did not use zofran. With next cycle, will use phenergan + zofran bid, with additional phenergan or compazine at 6 hrs prn. She kept down fluids tho no solid food during those 3 days, but was able to ea pizza last pm.  She is finishing bid lovenox before changing to once daily arixtra. She has had no bleeding. Bowels are moving. She has been able to walk to and from table for meals, which is more than previously.    She has PICC, which mother flushes at home + dressing changes weekly at [facility].  Patient's brother is diabetic, just found to have gastroparesis.  ONCOLOGIC HISTORY 1 Patient developed excessive uterine bleeding. Dilatation and curettage with hysteroscopy documented grade 1 endometrioid endometrial adenocarcinoma. Due to morbid obesity Mirena IUD was placed along with Aygestin. Patient had a dramatic directed weight loss with nutritional support she went from 523 pounds to 359 pounds in 10/2009. On 12/27/2009 she underwent a robotic-assisted laparoscopic hysterectomy, bilateral salpingo-oophorectomy,  and mini laparotomy through the umbilical port with morcellation of uterus within about the delivery of the uterus. The final pathology revealed an endometrial adenocarcinoma grade 2 with invasion limited to 1 mm of the myometrium.  #2 Patient did well until 09/2012 when she developed right upper quadrant pain, presumed cholelithiasis. 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 Omental biopsies had metastatic adenocarcinoma consistent with recurrent endometrial carcinoma. She had 12 cycles taxol carboplatin from 11-04-12 thru 07-24-13 with neulasta day 2. CT AP after cycle 12 showed stable to minimally improved disease. With progressive neuropathy, plan is to continue single agent carboplatin for now.  #3 Abdominal ascites with multiple paracenteses.  #4 Back pain and subsequent MRI on 02/05/2013 revealed compression fractures of T10 and T11. Kyphoplasty 02/26/2013.  #6 Patient hospitalized and underwent more MRI's on 12/4 and L1 and L2 compression fractures were noted, after consultation with Dr. Sherwood Gambler, the patient was referred back to interventional radiology for evaluation for kyphoplasty and this was performed on 04/24/13.  #6. Right upper extremity DVT 04/13/13 related to [facility], which was removed. Continues enoxaparin BID until she finishes present supply, then will change to Arixtra at once daily dosing as insurance copay is same.   Review oftems as above, also: Right ear no longer tender, antibiotics completed and aunt plans to fix pillow to avoid pressure there. No problems with PICC. Remainder of 10 point Review of Systems negative.  Objective:   Vital signs in last 24 hours:  BP 115/81  Pulse 95  Temp(Src) 97.5 F (36.4 C) (Oral)  Resp 18  Ht 5\' 11"  (1.803 m)  Wt  387 lb 8 oz (175.769 kg)  BMI 54.07 kg/m2  SpO2 95% Weight is down 12 lbs. Alert, oriented and appropriate. Looks comfortable seated in WC on  RA. Alopecia  HEENT:PERRL, sclerae not icteric. Oral mucosa moist without lesions, posterior pharynx clear. Right ear minimal erythema, no swelling or drainage, not tender. Neck supple. No JVD.  Lymphatics:no cervical,suraclavicular, axillary or inguinal adenopathy Resp: clear to auscultation bilaterally and normal percussion bilaterally Cardio: regular rate and rhythm. No gallop. GI: abdomen obese, soft, nontender, not obviously distended, no appreciable mass or organomegaly. A few bowel sounds.  Musculoskeletal/ Extremities: chronic venous stasis changes lower legs bilaterally without pitting edema, cords, tenderness Neuro: no peripheral neuropathy. Otherwise nonfocal Skin without rash, ecchymosis, petechiae PICC site unremarkable, no swelling LUE  Lab Results:  Results for orders placed in visit on 08/27/13  CBC WITH DIFFERENTIAL      Result Value Ref Range   WBC 10.2  3.9 - 10.3 10e3/uL   NEUT# 7.3 (*) 1.5 - 6.5 10e3/uL   HGB 11.9  11.6 - 15.9 g/dL   HCT 35.2  34.8 - 46.6 %   Platelets 180  145 - 400 10e3/uL   MCV 103.7 (*) 79.5 - 101.0 fL   MCH 35.0 (*) 25.1 - 34.0 pg   MCHC 33.7  31.5 - 36.0 g/dL   RBC 3.39 (*) 3.70 - 5.45 10e6/uL   RDW 21.0 (*) 11.2 - 14.5 %   lymph# 1.9  0.9 - 3.3 10e3/uL   MONO# 0.8  0.1 - 0.9 10e3/uL   Eosinophils Absolute 0.1  0.0 - 0.5 10e3/uL   Basophils Absolute 0.0  0.0 - 0.1 10e3/uL   NEUT% 71.8  38.4 - 76.8 %   LYMPH% 18.8  14.0 - 49.7 %   MONO% 8.1  0.0 - 14.0 %   EOS% 1.0  0.0 - 7.0 %   BASO% 0.3  0.0 - 2.0 %  IRON AND TIBC [facility]      Result Value Ref Range   Iron 64  41 - 142 ug/dL   TIBC 268  236 - 444 ug/dL   UIBC 203  120 - 384 ug/dL   %SAT 24  21 - 57 %  FERRITIN [facility]      Result Value Ref Range   Ferritin 775 (*) 9 - 269 ng/ml     Studies/Results:  No results found.  Medications: I have reviewed the patient's current medications. She is on ferrous sulfate daily, which may want to change to ferrous fumarate for less GI side  effects when next refill due.  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 Nausea/ vomiting better but still significant with changes in antiemetics. Adjust further next cycle as above/ continue EMEND. I will see her at least 5-21, with carboplatin that day if appropriate. 3.PICC in: mother flushes daily, dressing changes at [facility] weekly  4.morbid obesity  5.long term anticoagulation since PAC-associated DVT in Dec 2014.  Change to arixtra to allow single injection daily  6.T spine compression fractures Oct 2014 after fall, post kyphoplasties. 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 Continuing chemo as single agent carboplatin now. 9. 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 Need to clarify code status   Patient and mother had all questions answered  and are in agreement with plan.  Following visit I am unable to enter neulasta order for 5-22; I will follow up with financial staff. Time spent 25 min including >50% counseling and coordination of care  Gordy Levan, MD   08/27/2013, 9:48 PM

## 2013-08-27 NOTE — Patient Instructions (Signed)
With next chemo, give phenergan + zofran regularly each AM and each PM. If nausea between doses, you can give compazine 4-6 hours after phenergan.

## 2013-08-27 NOTE — Patient Instructions (Signed)
PICC Home Guide A peripherally inserted central catheter (PICC) is a long, thin, flexible tube that is inserted into a vein in the upper arm. It is a form of intravenous (IV) access. It is considered to be a "central" line because the tip of the PICC ends in a large vein in your chest. This large vein is called the superior vena cava (SVC). The PICC tip ends in the SVC because there is a lot of blood flow in the SVC. This allows medicines and IV fluids to be quickly distributed throughout the body. The PICC is inserted using a sterile technique by a specially trained nurse or physician. After the PICC is inserted, a chest X-ray exam is done to be sure it is in the correct place.  A PICC may be placed for different reasons, such as:  To give medicines and liquid nutrition that can only be given through a central line. Examples are:  Certain antibiotic treatments.  Chemotherapy.  Total parenteral nutrition (TPN).  To take frequent blood samples.  To give IV fluids and blood products.  If there is difficulty placing a peripheral intravenous (PIV) catheter. If taken care of properly, a PICC can remain in place for several months. A PICC can also allow a person to go home from the hospital early. Medicine and PICC care can be managed at home by a family member or home health care team. WHAT PROBLEMS CAN HAPPEN WHEN I HAVE A PICC? Problems with a PICC can occasionally occur. These may include:  A blood clot (thrombus) forming in or at the tip of the PICC. This can cause the PICC to become clogged. A clot-dissolving medicine called tissue plasminogen activator (tPA) can be given through the PICC to help break up the clot.  Inflammation of the vein (phlebitis) in which the PICC is placed. Signs of inflammation may include redness, pain at the insertion site, red streaks, or being able to feel a "cord" in the vein where the PICC is located.  Infection in the PICC or at the insertion site. Signs of  infection may include fever, chills, redness, swelling, or pus drainage from the PICC insertion site.  PICC movement (malposition). The PICC tip may move from its original position due to excessive physical activity, forceful coughing, sneezing, or vomiting.  A break or cut in the PICC. It is important to not use scissors near the PICC.  Nerve or tendon irritation or injury during PICC insertion. WHAT SHOULD I KEEP IN MIND ABOUT ACTIVITIES WHEN I HAVE A PICC?  You may bend your arm and move it freely. If your PICC is near or at the bend of your elbow, avoid activity with repeated motion at the elbow.  Rest at home for the remainder of the day following PICC line insertion.  Avoid lifting heavy objects as instructed by your health care provider.  Avoid using a crutch with the arm on the same side as your PICC. You may need to use a walker. WHAT SHOULD I KNOW ABOUT MY PICC DRESSING?  Keep your PICC bandage (dressing) clean and dry to prevent infection.  Ask your health care provider when you may shower. Ask your health care provider to teach you how to wrap the PICC when you do take a shower.  Change the PICC dressing as instructed by your health care provider.  Change your PICC dressing if it becomes loose or wet. WHAT SHOULD I KNOW ABOUT PICC CARE?  Check the PICC insertion site daily for   leakage, redness, swelling, or pain.  Do not take a bath, swim, or use hot tubs when you have a PICC. Cover PICC line with clear plastic wrap and tape to keep it dry while showering.  Flush the PICC as directed by your health care provider. Let your health care provider know right away if the PICC is difficult to flush or does not flush. Do not use force to flush the PICC.  Do not use a syringe that is less than 10 mL to flush the PICC.  Never pull or tug on the PICC.  Avoid blood pressure checks on the arm with the PICC.  Keep your PICC identification card with you at all times.  Do not  take the PICC out yourself. Only a trained clinical professional should remove the PICC. SEEK IMMEDIATE MEDICAL CARE IF:  Your PICC is accidently pulled all the way out. If this happens, cover the insertion site with a bandage or gauze dressing. Do not throw the PICC away. Your health care provider will need to inspect it.  Your PICC was tugged or pulled and has partially come out. Do not  push the PICC back in.  There is any type of drainage, redness, or swelling where the PICC enters the skin.  You cannot flush the PICC, it is difficult to flush, or the PICC leaks around the insertion site when it is flushed.  You hear a "flushing" sound when the PICC is flushed.  You have pain, discomfort, or numbness in your arm, shoulder, or jaw on the same side as the PICC.  You feel your heart "racing" or skipping beats.  You notice a hole or tear in the PICC.  You develop chills or a fever. MAKE SURE YOU:   Understand these instructions.  Will watch your condition.  Will get help right away if you are not doing well or get worse. Document Released: 10/14/2002 Document Revised: 01/28/2013 Document Reviewed: 12/15/2012 [employer] Patient Information 2014 [employer].  

## 2013-08-29 ENCOUNTER — Telehealth: Payer: Self-pay | Admitting: Oncology

## 2013-08-31 ENCOUNTER — Telehealth: Payer: Self-pay | Admitting: *Deleted

## 2013-08-31 NOTE — Telephone Encounter (Signed)
i hav called and spoke to the patient regarding her appt on 5/15. Appt moved to later that day, patient aware.

## 2013-09-01 ENCOUNTER — Other Ambulatory Visit: Payer: Self-pay | Admitting: *Deleted

## 2013-09-01 DIAGNOSIS — C549 Malignant neoplasm of corpus uteri, unspecified: Secondary | ICD-10-CM

## 2013-09-01 MED ORDER — SUCRALFATE 1 GM/10ML PO SUSP: ORAL | Status: DC

## 2013-09-04 ENCOUNTER — Ambulatory Visit: Payer: BC Managed Care – PPO

## 2013-09-04 ENCOUNTER — Other Ambulatory Visit: Payer: Self-pay | Admitting: *Deleted

## 2013-09-04 ENCOUNTER — Ambulatory Visit (HOSPITAL_BASED_OUTPATIENT_CLINIC_OR_DEPARTMENT_OTHER): Payer: BC Managed Care – PPO

## 2013-09-04 VITALS — BP 94/63 | HR 91 | Temp 97.0°F

## 2013-09-04 DIAGNOSIS — C549 Malignant neoplasm of corpus uteri, unspecified: Secondary | ICD-10-CM

## 2013-09-04 DIAGNOSIS — Z452 Encounter for adjustment and management of vascular access device: Secondary | ICD-10-CM

## 2013-09-04 MED ORDER — PROMETHAZINE HCL 25 MG PO TABS: ORAL_TABLET | ORAL | Status: DC

## 2013-09-04 MED ORDER — ONDANSETRON HCL 8 MG PO TABS: ORAL_TABLET | ORAL | Status: DC

## 2013-09-04 MED ORDER — HEPARIN SOD (PORK) LOCK FLUSH 100 UNIT/ML IV SOLN
500.0000 [IU] | Freq: Once | INTRAVENOUS | Status: AC
  Administered 2013-09-04: 250 [IU] via INTRAVENOUS
  Filled 2013-09-04: qty 5

## 2013-09-04 MED ORDER — SODIUM CHLORIDE 0.9 % IJ SOLN
10.0000 mL | INTRAMUSCULAR | Status: DC | PRN
  Administered 2013-09-04: 10 mL via INTRAVENOUS
  Filled 2013-09-04: qty 10

## 2013-09-04 NOTE — Patient Instructions (Signed)
PICC Home Guide A peripherally inserted central catheter (PICC) is a long, thin, flexible tube that is inserted into a vein in the upper arm. It is a form of intravenous (IV) access. It is considered to be a "central" line because the tip of the PICC ends in a large vein in your chest. This large vein is called the superior vena cava (SVC). The PICC tip ends in the SVC because there is a lot of blood flow in the SVC. This allows medicines and IV fluids to be quickly distributed throughout the body. The PICC is inserted using a sterile technique by a specially trained nurse or physician. After the PICC is inserted, a chest X-ray exam is done to be sure it is in the correct place.  A PICC may be placed for different reasons, such as:  To give medicines and liquid nutrition that can only be given through a central line. Examples are:  Certain antibiotic treatments.  Chemotherapy.  Total parenteral nutrition (TPN).  To take frequent blood samples.  To give IV fluids and blood products.  If there is difficulty placing a peripheral intravenous (PIV) catheter. If taken care of properly, a PICC can remain in place for several months. A PICC can also allow a person to go home from the hospital early. Medicine and PICC care can be managed at home by a family member or home health care team. WHAT PROBLEMS CAN HAPPEN WHEN I HAVE A PICC? Problems with a PICC can occasionally occur. These may include:  A blood clot (thrombus) forming in or at the tip of the PICC. This can cause the PICC to become clogged. A clot-dissolving medicine called tissue plasminogen activator (tPA) can be given through the PICC to help break up the clot.  Inflammation of the vein (phlebitis) in which the PICC is placed. Signs of inflammation may include redness, pain at the insertion site, red streaks, or being able to feel a "cord" in the vein where the PICC is located.  Infection in the PICC or at the insertion site. Signs of  infection may include fever, chills, redness, swelling, or pus drainage from the PICC insertion site.  PICC movement (malposition). The PICC tip may move from its original position due to excessive physical activity, forceful coughing, sneezing, or vomiting.  A break or cut in the PICC. It is important to not use scissors near the PICC.  Nerve or tendon irritation or injury during PICC insertion. WHAT SHOULD I KEEP IN MIND ABOUT ACTIVITIES WHEN I HAVE A PICC?  You may bend your arm and move it freely. If your PICC is near or at the bend of your elbow, avoid activity with repeated motion at the elbow.  Rest at home for the remainder of the day following PICC line insertion.  Avoid lifting heavy objects as instructed by your health care provider.  Avoid using a crutch with the arm on the same side as your PICC. You may need to use a walker. WHAT SHOULD I KNOW ABOUT MY PICC DRESSING?  Keep your PICC bandage (dressing) clean and dry to prevent infection.  Ask your health care provider when you may shower. Ask your health care provider to teach you how to wrap the PICC when you do take a shower.  Change the PICC dressing as instructed by your health care provider.  Change your PICC dressing if it becomes loose or wet. WHAT SHOULD I KNOW ABOUT PICC CARE?  Check the PICC insertion site daily for   leakage, redness, swelling, or pain.  Do not take a bath, swim, or use hot tubs when you have a PICC. Cover PICC line with clear plastic wrap and tape to keep it dry while showering.  Flush the PICC as directed by your health care provider. Let your health care provider know right away if the PICC is difficult to flush or does not flush. Do not use force to flush the PICC.  Do not use a syringe that is less than 10 mL to flush the PICC.  Never pull or tug on the PICC.  Avoid blood pressure checks on the arm with the PICC.  Keep your PICC identification card with you at all times.  Do not  take the PICC out yourself. Only a trained clinical professional should remove the PICC. SEEK IMMEDIATE MEDICAL CARE IF:  Your PICC is accidently pulled all the way out. If this happens, cover the insertion site with a bandage or gauze dressing. Do not throw the PICC away. Your health care provider will need to inspect it.  Your PICC was tugged or pulled and has partially come out. Do not  push the PICC back in.  There is any type of drainage, redness, or swelling where the PICC enters the skin.  You cannot flush the PICC, it is difficult to flush, or the PICC leaks around the insertion site when it is flushed.  You hear a "flushing" sound when the PICC is flushed.  You have pain, discomfort, or numbness in your arm, shoulder, or jaw on the same side as the PICC.  You feel your heart "racing" or skipping beats.  You notice a hole or tear in the PICC.  You develop chills or a fever. MAKE SURE YOU:   Understand these instructions.  Will watch your condition.  Will get help right away if you are not doing well or get worse. Document Released: 10/14/2002 Document Revised: 01/28/2013 Document Reviewed: 12/15/2012 [employer] Patient Information 2014 [employer].  

## 2013-09-06 ENCOUNTER — Other Ambulatory Visit: Payer: Self-pay | Admitting: Oncology

## 2013-09-09 ENCOUNTER — Other Ambulatory Visit: Payer: Self-pay | Admitting: Oncology

## 2013-09-10 ENCOUNTER — Ambulatory Visit (HOSPITAL_BASED_OUTPATIENT_CLINIC_OR_DEPARTMENT_OTHER): Payer: BC Managed Care – PPO | Admitting: Oncology

## 2013-09-10 ENCOUNTER — Other Ambulatory Visit (HOSPITAL_BASED_OUTPATIENT_CLINIC_OR_DEPARTMENT_OTHER): Payer: BC Managed Care – PPO

## 2013-09-10 ENCOUNTER — Other Ambulatory Visit: Payer: Self-pay | Admitting: *Deleted

## 2013-09-10 ENCOUNTER — Ambulatory Visit (HOSPITAL_BASED_OUTPATIENT_CLINIC_OR_DEPARTMENT_OTHER): Payer: BC Managed Care – PPO

## 2013-09-10 ENCOUNTER — Encounter: Payer: Self-pay | Admitting: Oncology

## 2013-09-10 ENCOUNTER — Other Ambulatory Visit: Payer: BC Managed Care – PPO

## 2013-09-10 VITALS — BP 112/77 | HR 98 | Temp 97.4°F | Resp 19 | Ht 71.0 in | Wt >= 6400 oz

## 2013-09-10 DIAGNOSIS — K219 Gastro-esophageal reflux disease without esophagitis: Secondary | ICD-10-CM

## 2013-09-10 DIAGNOSIS — C779 Secondary and unspecified malignant neoplasm of lymph node, unspecified: Secondary | ICD-10-CM

## 2013-09-10 DIAGNOSIS — C549 Malignant neoplasm of corpus uteri, unspecified: Secondary | ICD-10-CM

## 2013-09-10 DIAGNOSIS — C50919 Malignant neoplasm of unspecified site of unspecified female breast: Secondary | ICD-10-CM

## 2013-09-10 DIAGNOSIS — I82409 Acute embolism and thrombosis of unspecified deep veins of unspecified lower extremity: Secondary | ICD-10-CM

## 2013-09-10 DIAGNOSIS — C541 Malignant neoplasm of endometrium: Secondary | ICD-10-CM

## 2013-09-10 DIAGNOSIS — Z5111 Encounter for antineoplastic chemotherapy: Secondary | ICD-10-CM

## 2013-09-10 DIAGNOSIS — G609 Hereditary and idiopathic neuropathy, unspecified: Secondary | ICD-10-CM

## 2013-09-10 LAB — CBC WITH DIFFERENTIAL/PLATELET
BASO%: 0.6 % (ref 0.0–2.0)
Basophils Absolute: 0 10*3/uL (ref 0.0–0.1)
EOS ABS: 0.1 10*3/uL (ref 0.0–0.5)
EOS%: 1.1 % (ref 0.0–7.0)
HCT: 34.6 % — ABNORMAL LOW (ref 34.8–46.6)
HGB: 11.4 g/dL — ABNORMAL LOW (ref 11.6–15.9)
LYMPH#: 1.3 10*3/uL (ref 0.9–3.3)
LYMPH%: 24.3 % (ref 14.0–49.7)
MCH: 35.2 pg — ABNORMAL HIGH (ref 25.1–34.0)
MCHC: 32.8 g/dL (ref 31.5–36.0)
MCV: 107.1 fL — AB (ref 79.5–101.0)
MONO#: 0.4 10*3/uL (ref 0.1–0.9)
MONO%: 7.5 % (ref 0.0–14.0)
NEUT%: 66.5 % (ref 38.4–76.8)
NEUTROS ABS: 3.6 10*3/uL (ref 1.5–6.5)
Platelets: 113 10*3/uL — ABNORMAL LOW (ref 145–400)
RBC: 3.23 10*6/uL — ABNORMAL LOW (ref 3.70–5.45)
RDW: 19.9 % — AB (ref 11.2–14.5)
WBC: 5.3 10*3/uL (ref 3.9–10.3)

## 2013-09-10 LAB — COMPREHENSIVE METABOLIC PANEL (CC13)
ALBUMIN: 3.3 g/dL — AB (ref 3.5–5.0)
ALK PHOS: 82 U/L (ref 40–150)
ALT: 48 U/L (ref 0–55)
AST: 30 U/L (ref 5–34)
Anion Gap: 14 mEq/L — ABNORMAL HIGH (ref 3–11)
BUN: 8.3 mg/dL (ref 7.0–26.0)
CALCIUM: 9.6 mg/dL (ref 8.4–10.4)
CHLORIDE: 98 meq/L (ref 98–109)
CO2: 29 mEq/L (ref 22–29)
Creatinine: 0.8 mg/dL (ref 0.6–1.1)
GLUCOSE: 107 mg/dL (ref 70–140)
POTASSIUM: 4.1 meq/L (ref 3.5–5.1)
SODIUM: 141 meq/L (ref 136–145)
TOTAL PROTEIN: 6.9 g/dL (ref 6.4–8.3)
Total Bilirubin: 0.51 mg/dL (ref 0.20–1.20)

## 2013-09-10 MED ORDER — FOSAPREPITANT DIMEGLUMINE INJECTION 150 MG
150.0000 mg | Freq: Once | INTRAVENOUS | Status: AC
  Administered 2013-09-10: 150 mg via INTRAVENOUS
  Filled 2013-09-10: qty 5

## 2013-09-10 MED ORDER — HEPARIN SOD (PORK) LOCK FLUSH 100 UNIT/ML IV SOLN
250.0000 [IU] | Freq: Once | INTRAVENOUS | Status: AC | PRN
  Administered 2013-09-10: 250 [IU]
  Filled 2013-09-10: qty 5

## 2013-09-10 MED ORDER — SODIUM CHLORIDE 0.9 % IJ SOLN
10.0000 mL | INTRAMUSCULAR | Status: DC | PRN
  Administered 2013-09-10: 10 mL
  Filled 2013-09-10: qty 10

## 2013-09-10 MED ORDER — ONDANSETRON 16 MG/50ML IVPB (CHCC)
INTRAVENOUS | Status: AC
  Filled 2013-09-10: qty 16

## 2013-09-10 MED ORDER — CARBOPLATIN CHEMO INTRADERMAL TEST DOSE 100MCG/0.02ML
100.0000 ug | Freq: Once | INTRADERMAL | Status: AC
  Administered 2013-09-10: 100 ug via INTRADERMAL
  Filled 2013-09-10: qty 0.01

## 2013-09-10 MED ORDER — OXYCODONE-ACETAMINOPHEN 10-325 MG PO TABS: 1.0000 | ORAL_TABLET | ORAL | Status: DC | PRN

## 2013-09-10 MED ORDER — ONDANSETRON 16 MG/50ML IVPB (CHCC)
16.0000 mg | Freq: Once | INTRAVENOUS | Status: AC
  Administered 2013-09-10: 16 mg via INTRAVENOUS

## 2013-09-10 MED ORDER — DEXAMETHASONE SODIUM PHOSPHATE 20 MG/5ML IJ SOLN
INTRAMUSCULAR | Status: AC
  Filled 2013-09-10: qty 5

## 2013-09-10 MED ORDER — SODIUM CHLORIDE 0.9 % IV SOLN
900.0000 mg | Freq: Once | INTRAVENOUS | Status: AC
  Administered 2013-09-10: 900 mg via INTRAVENOUS
  Filled 2013-09-10: qty 90

## 2013-09-10 MED ORDER — SODIUM CHLORIDE 0.9 % IV SOLN
Freq: Once | INTRAVENOUS | Status: AC
  Administered 2013-09-10: 10:00:00 via INTRAVENOUS

## 2013-09-10 MED ORDER — DEXAMETHASONE SODIUM PHOSPHATE 20 MG/5ML IJ SOLN
12.0000 mg | Freq: Once | INTRAMUSCULAR | Status: AC
  Administered 2013-09-10: 12 mg via INTRAVENOUS

## 2013-09-10 NOTE — Patient Instructions (Signed)
[facility] Discharge Instructions for Patients Receiving Chemotherapy  Today you received the following chemotherapy agents Carboplatin To help prevent nausea and vomiting after your treatment, we encourage you to take your nausea medication as prescribed.  If you develop nausea and vomiting that is not controlled by your nausea medication, call the clinic.   BELOW ARE SYMPTOMS THAT SHOULD BE REPORTED IMMEDIATELY:  *FEVER GREATER THAN 100.5 F  *CHILLS WITH OR WITHOUT FEVER  NAUSEA AND VOMITING THAT IS NOT CONTROLLED WITH YOUR NAUSEA MEDICATION  *UNUSUAL SHORTNESS OF BREATH  *UNUSUAL BRUISING OR BLEEDING  TENDERNESS IN MOUTH AND THROAT WITH OR WITHOUT PRESENCE OF ULCERS  *URINARY PROBLEMS  *BOWEL PROBLEMS  UNUSUAL RASH Items with * indicate a potential emergency and should be followed up as soon as possible.  Feel free to call the clinic you have any questions or concerns. The clinic phone number is [phone].    

## 2013-09-10 NOTE — Progress Notes (Signed)
OFFICE PROGRESS NOTE   09/10/2013   Physicians:K.Khan, W.Brewster, Orpah Melter, M.Tsuei   INTERVAL HISTORY:  Patient is seen, together with mother and aunt, in continuing attention to metastatic endometrial cancer, now on q 3 week carboplatin, with neulasta support. Nausea was a little better with adjustments in antiemetics (IV EMEND, bid phenergan) after last treatment, tho still intermittent vomiting and dry heaves for several days. Plan now is bid phenergan + zofran, with prn phenergan or compazine q 6 hr between those doses.  She is slightly nauseated this AM, after getting up earlier than usual for this appointment etc. Bowels are moving daily and she is having no bleeding. PICC dressing needs change today, otherwise no concerns there. The once daily Arixtra injections are an improvement over bid lovenox.  She has PICC.  She celebrated her birthday this past weekend with surprise family cookout for 26 people.   ONCOLOGIC HISTORY 1 Patient developed excessive uterine bleeding. Dilatation and curettage with hysteroscopy documented grade 1 endometrioid endometrial adenocarcinoma. Due to morbid obesity Mirena IUD was placed along with Aygestin. Patient had a dramatic directed weight loss with nutritional support she went from 523 pounds to 359 pounds in 10/2009. On 12/27/2009 she underwent a robotic-assisted laparoscopic hysterectomy, bilateral salpingo-oophorectomy, and mini laparotomy through the umbilical port with morcellation of uterus within about the delivery of the uterus. The final pathology revealed an endometrial adenocarcinoma grade 2 with invasion limited to 1 mm of the myometrium.  #2 Patient did well until 09/2012 when she developed right upper quadrant pain, presumed cholelithiasis. 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 Omental biopsies had metastatic adenocarcinoma consistent with recurrent  endometrial carcinoma. She had 12 cycles taxol carboplatin from 11-04-12 thru 07-24-13 with neulasta day 2. CT AP after cycle 12 showed stable to minimally improved disease. With progressive neuropathy, plan is to continue single agent carboplatin for now.  #3 Abdominal ascites with multiple paracenteses.  #4 Back pain and subsequent MRI on 02/05/2013 revealed compression fractures of T10 and T11. Kyphoplasty 02/26/2013.  #6 Patient hospitalized and underwent more MRI's on 12/4 and L1 and L2 compression fractures were noted, after consultation with Dr. Sherwood Gambler, the patient was referred back to interventional radiology for evaluation for kyphoplasty and this was performed on 04/24/13.  #6. Right upper extremity DVT 04/13/13 related to [facility], which was removed. Now on once daily Arixtra.  Review of systems as above, also: Walking short distances in home. Still sleeps on right side with pressure on right ear, due to LUE PICC. Slight swelling LE. Still using mycostatin cream to right ear which we will stop now. Neuropathy in hands a little better off taxol, feet unchanged. Remainder of 10 point Review of Systems negative.  Objective:  Vital signs in last 24 hours:  BP 112/77  Pulse 98  Temp(Src) 97.4 F (36.3 C) (Oral)  Resp 19  Ht 5\' 11"  (1.803 m)  Wt 403 lb 6.4 oz (182.981 kg)  BMI 56.29 kg/m2  SpO2 96%  Patient tearful, apparently about weight,  family doubts scales accurate today.  Alert, oriented and appropriate. Seated in WC, respirations not labored RA Alopecia  HEENT:PERRL, sclerae not icteric. Oral mucosa moist without lesions, posterior pharynx clear. Right ear still slightly erythematous, moist in external pinna fold, no open areas now Neck supple. No JVD.  Lymphatics:no cervical,supraclavicular adenopathy Resp: diminished BS lower fields otherwise clear to auscultation bilaterally  Cardio: regular rate and rhythm. No gallop. GI: abdomen massively  obese, soft, nontender. Few  BS. Musculoskeletal/ Extremities: bilateral trace pedal edema and venous stasis changes lower legs bilaterally; no cords, tenderness Neuro:  peripheral neuropathy fingers and feet. PSYCH: tearful as above, but interacts and conversation appropriate Skin without rash, ecchymosis, petechiae. Left ear as above PICC RUE-without erythema or tenderness  Lab Results:  Results for orders placed in visit on 09/10/13  CBC WITH DIFFERENTIAL      Result Value Ref Range   WBC 5.3  3.9 - 10.3 10e3/uL   NEUT# 3.6  1.5 - 6.5 10e3/uL   HGB 11.4 (*) 11.6 - 15.9 g/dL   HCT 34.6 (*) 34.8 - 46.6 %   Platelets 113 repeated and verified (*) 145 - 400 10e3/uL   MCV 107.1 (*) 79.5 - 101.0 fL   MCH 35.2 (*) 25.1 - 34.0 pg   MCHC 32.8  31.5 - 36.0 g/dL   RBC 3.23 (*) 3.70 - 5.45 10e6/uL   RDW 19.9 (*) 11.2 - 14.5 %   lymph# 1.3  0.9 - 3.3 10e3/uL   MONO# 0.4  0.1 - 0.9 10e3/uL   Eosinophils Absolute 0.1  0.0 - 0.5 10e3/uL   Basophils Absolute 0.0  0.0 - 0.1 10e3/uL   NEUT% 66.5  38.4 - 76.8 %   LYMPH% 24.3  14.0 - 49.7 %   MONO% 7.5  0.0 - 14.0 %   EOS% 1.1  0.0 - 7.0 %   BASO% 0.6  0.0 - 2.0 %  COMPREHENSIVE METABOLIC PANEL (ZD63)      Result Value Ref Range   Sodium 141  136 - 145 mEq/L   Potassium 4.1  3.5 - 5.1 mEq/L   Chloride 98  98 - 109 mEq/L   CO2 29  22 - 29 mEq/L   Glucose 107  70 - 140 mg/dl   BUN 8.3  7.0 - 26.0 mg/dL   Creatinine 0.8  0.6 - 1.1 mg/dL   Total Bilirubin 0.51  0.20 - 1.20 mg/dL   Alkaline Phosphatase 82  40 - 150 U/L   AST 30  5 - 34 U/L   ALT 48  0 - 55 U/L   Total Protein 6.9  6.4 - 8.3 g/dL   Albumin 3.3 (*) 3.5 - 5.0 g/dL   Calcium 9.6  8.4 - 10.4 mg/dL   Anion Gap 14 (*) 3 - 11 mEq/L     Studies/Results:  No results found.  Medications: I have reviewed the patient's current medications. They will use zofran 8 mg + phenergan bid next several days, with additional q 6 hr prn phenergan or compazine. (Note not on Reglan). Stop mycostatin cream to ear as no yeast  now and this may be keeping area too moist. Continue neulasta  day 2  DISCUSSION: Patient and family understand reasoning for carboplatin single agent now.  Assessment/Plan: 1.metastatic endometrial carcinoma with mostly pelvic involvement by last imaging, stable to slightly improved: will continue chemotherapy with carboplatin q 3 weeks as tolerated, with neulasta. Antiemetic changes as above for nausea/ vomiting/ continue EMEND.  Consider adding reglan if still not adequately controlled 3.PICC in: mother flushes daily, dressing changes at [facility] weekly  4.morbid obesity  5.long term anticoagulation since PAC-associated DVT in Dec 201, now on arixtra once daily  6.T spine compression fractures Oct 2014 after fall, post kyphoplasties. 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 Continuing chemo as single agent carboplatin now.  9. 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 Need to change pillow contact or positioning otherwise.  Following visit, I cannot locate advance directives in EMR   I will see her back coordinating with PICC flush ~ 09-23-13.    Gordy Levan, MD   09/10/2013, 9:59 AM

## 2013-09-11 ENCOUNTER — Ambulatory Visit (HOSPITAL_BASED_OUTPATIENT_CLINIC_OR_DEPARTMENT_OTHER): Payer: BC Managed Care – PPO

## 2013-09-11 ENCOUNTER — Ambulatory Visit: Payer: BC Managed Care – PPO | Admitting: Oncology

## 2013-09-11 ENCOUNTER — Other Ambulatory Visit: Payer: BC Managed Care – PPO

## 2013-09-11 VITALS — BP 97/59 | HR 88 | Temp 97.6°F

## 2013-09-11 DIAGNOSIS — Z5189 Encounter for other specified aftercare: Secondary | ICD-10-CM

## 2013-09-11 DIAGNOSIS — C549 Malignant neoplasm of corpus uteri, unspecified: Secondary | ICD-10-CM

## 2013-09-11 DIAGNOSIS — C541 Malignant neoplasm of endometrium: Secondary | ICD-10-CM

## 2013-09-11 MED ORDER — PEGFILGRASTIM INJECTION 6 MG/0.6ML
6.0000 mg | Freq: Once | SUBCUTANEOUS | Status: AC
  Administered 2013-09-11: 6 mg via SUBCUTANEOUS
  Filled 2013-09-11: qty 0.6

## 2013-09-11 NOTE — Patient Instructions (Signed)

## 2013-09-12 ENCOUNTER — Ambulatory Visit: Payer: BC Managed Care – PPO

## 2013-09-16 ENCOUNTER — Other Ambulatory Visit: Payer: Self-pay

## 2013-09-17 ENCOUNTER — Other Ambulatory Visit: Payer: Self-pay | Admitting: *Deleted

## 2013-09-17 ENCOUNTER — Other Ambulatory Visit: Payer: Self-pay | Admitting: Oncology

## 2013-09-17 ENCOUNTER — Telehealth: Payer: Self-pay | Admitting: *Deleted

## 2013-09-17 ENCOUNTER — Ambulatory Visit (HOSPITAL_BASED_OUTPATIENT_CLINIC_OR_DEPARTMENT_OTHER): Payer: BC Managed Care – PPO

## 2013-09-17 ENCOUNTER — Telehealth: Payer: Self-pay | Admitting: Oncology

## 2013-09-17 ENCOUNTER — Ambulatory Visit (HOSPITAL_BASED_OUTPATIENT_CLINIC_OR_DEPARTMENT_OTHER): Payer: BC Managed Care – PPO | Admitting: Oncology

## 2013-09-17 ENCOUNTER — Ambulatory Visit: Payer: BC Managed Care – PPO

## 2013-09-17 ENCOUNTER — Encounter: Payer: Self-pay | Admitting: Oncology

## 2013-09-17 VITALS — BP 107/82 | HR 96 | Temp 98.3°F

## 2013-09-17 DIAGNOSIS — C541 Malignant neoplasm of endometrium: Secondary | ICD-10-CM

## 2013-09-17 DIAGNOSIS — R197 Diarrhea, unspecified: Secondary | ICD-10-CM

## 2013-09-17 DIAGNOSIS — R112 Nausea with vomiting, unspecified: Secondary | ICD-10-CM

## 2013-09-17 DIAGNOSIS — A0472 Enterocolitis due to Clostridium difficile, not specified as recurrent: Secondary | ICD-10-CM

## 2013-09-17 DIAGNOSIS — Z452 Encounter for adjustment and management of vascular access device: Secondary | ICD-10-CM

## 2013-09-17 LAB — CLOSTRIDIUM DIFFICILE BY PCR: Toxigenic C. Difficile by PCR: POSITIVE — CR

## 2013-09-17 MED ORDER — SODIUM CHLORIDE 0.9 % IJ SOLN
10.0000 mL | INTRAMUSCULAR | Status: DC | PRN
  Administered 2013-09-17: 10 mL via INTRAVENOUS
  Filled 2013-09-17: qty 10

## 2013-09-17 MED ORDER — ONDANSETRON 16 MG/50ML IVPB (CHCC)
16.0000 mg | Freq: Once | INTRAVENOUS | Status: AC
  Administered 2013-09-17: 16 mg via INTRAVENOUS

## 2013-09-17 MED ORDER — SODIUM CHLORIDE 0.9 % IV SOLN
1000.0000 mL | Freq: Once | INTRAVENOUS | Status: AC
  Administered 2013-09-17: 1000 mL via INTRAVENOUS

## 2013-09-17 MED ORDER — HEPARIN SOD (PORK) LOCK FLUSH 100 UNIT/ML IV SOLN
500.0000 [IU] | Freq: Once | INTRAVENOUS | Status: DC
  Administered 2013-09-17: 500 [IU] via INTRAVENOUS
  Filled 2013-09-17: qty 5

## 2013-09-17 MED ORDER — ONDANSETRON 16 MG/50ML IVPB (CHCC)
INTRAVENOUS | Status: AC
  Filled 2013-09-17: qty 16

## 2013-09-17 MED ORDER — METRONIDAZOLE 500 MG PO TABS: 500.0000 mg | ORAL_TABLET | Freq: Three times a day (TID) | ORAL | Status: DC

## 2013-09-17 NOTE — Telephone Encounter (Signed)
Medical Oncology  Stool + C.diff by PCA. Spoke with mother by phone now. Will begin flagyl 500 mg tid x 14 days, called in to [name] now and mother to pick up and begin now. Discussed possible taste changes with flagyl, need to push po hydration, avoid anti-diarrhea meds, strict handwashing. Patient uses [facility] at home, mother already uses gloves and clorox spray. They will let us know if concerns prior to visit next week.  Godfrey Pick, MD

## 2013-09-17 NOTE — Telephone Encounter (Signed)
Patient's mother left a message that patient has had vomiting and diarrhea since Monday, 5/25. Would like for her to be seen today when she comes in for flush.

## 2013-09-17 NOTE — Patient Instructions (Signed)
Dehydration, Adult  Dehydration means your body does not have as much fluid as it needs. Your kidneys, brain, and heart will not work properly without the right amount of fluids and salt.   HOME CARE   Ask your doctor how to replace body fluid losses (rehydrate).   Drink enough fluids to keep your pee (urine) clear or pale yellow.   Drink small amounts of fluids often if you feel sick to your stomach (nauseous) or throw up (vomit).   Eat like you normally do.   Avoid:   Foods or drinks high in sugar.   Bubbly (carbonated) drinks.   Juice.   Very hot or cold fluids.   Drinks with caffeine.   Fatty, greasy foods.   Alcohol.   Tobacco.   Eating too much.   Gelatin desserts.   Wash your hands to avoid spreading germs (bacteria, viruses).   Only take medicine as told by your doctor.   Keep all doctor visits as told.  GET HELP RIGHT AWAY IF:    You cannot drink something without throwing up.   You get worse even with treatment.   Your vomit has blood in it or looks greenish.   Your poop (stool) has blood in it or looks black and tarry.   You have not peed in 6 to 8 hours.   You pee a small amount of very dark pee.   You have a fever.   You pass out (faint).   You have belly (abdominal) pain that gets worse or stays in one spot (localizes).   You have a rash, stiff neck, or bad headache.   You get easily annoyed, sleepy, or are hard to wake up.   You feel weak, dizzy, or very thirsty.  MAKE SURE YOU:    Understand these instructions.   Will watch your condition.   Will get help right away if you are not doing well or get worse.  Document Released: 02/03/2009 Document Revised: 07/02/2011 Document Reviewed: 11/27/2010  [employer] Patient Information 2014 [employer].

## 2013-09-17 NOTE — Patient Instructions (Signed)
PICC Home Guide A peripherally inserted central catheter (PICC) is a long, thin, flexible tube that is inserted into a vein in the upper arm. It is a form of intravenous (IV) access. It is considered to be a "central" line because the tip of the PICC ends in a large vein in your chest. This large vein is called the superior vena cava (SVC). The PICC tip ends in the SVC because there is a lot of blood flow in the SVC. This allows medicines and IV fluids to be quickly distributed throughout the body. The PICC is inserted using a sterile technique by a specially trained nurse or physician. After the PICC is inserted, a chest X-ray exam is done to be sure it is in the correct place.  A PICC may be placed for different reasons, such as:  To give medicines and liquid nutrition that can only be given through a central line. Examples are:  Certain antibiotic treatments.  Chemotherapy.  Total parenteral nutrition (TPN).  To take frequent blood samples.  To give IV fluids and blood products.  If there is difficulty placing a peripheral intravenous (PIV) catheter. If taken care of properly, a PICC can remain in place for several months. A PICC can also allow a person to go home from the hospital early. Medicine and PICC care can be managed at home by a family member or home health care team. WHAT PROBLEMS CAN HAPPEN WHEN I HAVE A PICC? Problems with a PICC can occasionally occur. These may include:  A blood clot (thrombus) forming in or at the tip of the PICC. This can cause the PICC to become clogged. A clot-dissolving medicine called tissue plasminogen activator (tPA) can be given through the PICC to help break up the clot.  Inflammation of the vein (phlebitis) in which the PICC is placed. Signs of inflammation may include redness, pain at the insertion site, red streaks, or being able to feel a "cord" in the vein where the PICC is located.  Infection in the PICC or at the insertion site. Signs of  infection may include fever, chills, redness, swelling, or pus drainage from the PICC insertion site.  PICC movement (malposition). The PICC tip may move from its original position due to excessive physical activity, forceful coughing, sneezing, or vomiting.  A break or cut in the PICC. It is important to not use scissors near the PICC.  Nerve or tendon irritation or injury during PICC insertion. WHAT SHOULD I KEEP IN MIND ABOUT ACTIVITIES WHEN I HAVE A PICC?  You may bend your arm and move it freely. If your PICC is near or at the bend of your elbow, avoid activity with repeated motion at the elbow.  Rest at home for the remainder of the day following PICC line insertion.  Avoid lifting heavy objects as instructed by your health care provider.  Avoid using a crutch with the arm on the same side as your PICC. You may need to use a walker. WHAT SHOULD I KNOW ABOUT MY PICC DRESSING?  Keep your PICC bandage (dressing) clean and dry to prevent infection.  Ask your health care provider when you may shower. Ask your health care provider to teach you how to wrap the PICC when you do take a shower.  Change the PICC dressing as instructed by your health care provider.  Change your PICC dressing if it becomes loose or wet. WHAT SHOULD I KNOW ABOUT PICC CARE?  Check the PICC insertion site daily for   leakage, redness, swelling, or pain.  Do not take a bath, swim, or use hot tubs when you have a PICC. Cover PICC line with clear plastic wrap and tape to keep it dry while showering.  Flush the PICC as directed by your health care provider. Let your health care provider know right away if the PICC is difficult to flush or does not flush. Do not use force to flush the PICC.  Do not use a syringe that is less than 10 mL to flush the PICC.  Never pull or tug on the PICC.  Avoid blood pressure checks on the arm with the PICC.  Keep your PICC identification card with you at all times.  Do not  take the PICC out yourself. Only a trained clinical professional should remove the PICC. SEEK IMMEDIATE MEDICAL CARE IF:  Your PICC is accidently pulled all the way out. If this happens, cover the insertion site with a bandage or gauze dressing. Do not throw the PICC away. Your health care provider will need to inspect it.  Your PICC was tugged or pulled and has partially come out. Do not  push the PICC back in.  There is any type of drainage, redness, or swelling where the PICC enters the skin.  You cannot flush the PICC, it is difficult to flush, or the PICC leaks around the insertion site when it is flushed.  You hear a "flushing" sound when the PICC is flushed.  You have pain, discomfort, or numbness in your arm, shoulder, or jaw on the same side as the PICC.  You feel your heart "racing" or skipping beats.  You notice a hole or tear in the PICC.  You develop chills or a fever. MAKE SURE YOU:   Understand these instructions.  Will watch your condition.  Will get help right away if you are not doing well or get worse. Document Released: 10/14/2002 Document Revised: 01/28/2013 Document Reviewed: 12/15/2012 [employer] Patient Information 2014 [employer].  

## 2013-09-17 NOTE — Progress Notes (Signed)
OFFICE PROGRESS NOTE   09/17/2013   Physicians:K.Khan, W.Brewster, Orpah Melter, M.Tsuei   INTERVAL HISTORY:   Patient is seen, together with mother and aunt, for unscheduled visit, as they had called office today to report ongoing nausea, vomiting and diarrhea. She had carboplatin on 5-21 with neulasta on 5-22, however despite changes in antiemetics she has had significant N/V with poor po intake, and also diarrhea which is atypical for her after chemo. She is receiving IV NS with IV antiemetics here now, and does feel some better after first 500 cc given. She has just gone to [facility] to void, and did not have diarrhea then (for first time). She has not had fever, and no one else at home is ill.   PICC dressing change done.  ONCOLOGIC HISTORY 1 Patient developed excessive uterine bleeding. Dilatation and curettage with hysteroscopy documented grade 1 endometrioid endometrial adenocarcinoma. Due to morbid obesity Mirena IUD was placed along with Aygestin. Patient had a dramatic directed weight loss with nutritional support she went from 523 pounds to 359 pounds in 10/2009. On 12/27/2009 she underwent a robotic-assisted laparoscopic hysterectomy, bilateral salpingo-oophorectomy, and mini laparotomy through the umbilical port with morcellation of uterus within about the delivery of the uterus. The final pathology revealed an endometrial adenocarcinoma grade 2 with invasion limited to 1 mm of the myometrium.  #2 Patient did well until 09/2012 when she developed right upper quadrant pain, presumed cholelithiasis. 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 Omental biopsies had metastatic adenocarcinoma consistent with recurrent endometrial carcinoma. She had 12 cycles taxol carboplatin from 11-04-12 thru 07-24-13 with neulasta day 2. CT AP after cycle 12 showed stable to minimally improved disease. With progressive neuropathy, plan is to  continue single agent carboplatin for now.  #3 Abdominal ascites with multiple paracenteses.  #4 Back pain and subsequent MRI on 02/05/2013 revealed compression fractures of T10 and T11. Kyphoplasty 02/26/2013.  #6 Patient hospitalized and underwent more MRI's on 12/4 and L1 and L2 compression fractures were noted, after consultation with Dr. Sherwood Gambler, the patient was referred back to interventional radiology for evaluation for kyphoplasty and this was performed on 04/24/13.  #6. Right upper extremity DVT 04/13/13 related to [facility], which was removed. Now on once daily Arixtra  Review of systems as above, also: No increased SOB. Right ear not painful, tho a little granulation tissue where previous irritation. No bleeding. Not weak or lightheaded seated. Remainder of 10 point Review of Systems negative.  Objective:  Vital signs in last 24 hours:  BP 107/82  Pulse 96  Temp(Src) 98.3 F (36.8 C)  Alert, oriented and appropriate. Looks somewhat uncomfortable but NAD seated in WC on room air Alopecia  HEENT:PERRL, sclerae not icteric. Oral mucosa a little dry without lesions, posterior pharynx clear.  Neck supple. No JVD.  Lymphatics:no cervical,suraclavicular adenopathy Resp: clear to auscultation bilaterally  Cardio: regular rate and rhythm. No gallop. XB:MWUXLKG massively obese, soft, nontender, not obviously distended.  bowel sounds present, not unusual.  Musculoskeletal/ Extremities: without pitting edema, cords, tenderness Skin Venous stasis changes lower legs unchanged, no bruises obvious at sites of arixtra. Otherwise without rash, ecchymosis, petechiae PICC site fine  Lab Results:  Results for orders placed in visit on 09/10/13  CBC WITH DIFFERENTIAL      Result Value Ref Range   WBC 5.3  3.9 - 10.3 10e3/uL   NEUT# 3.6  1.5 - 6.5 10e3/uL   HGB 11.4 (*) 11.6 - 15.9  g/dL   HCT 34.6 (*) 34.8 - 46.6 %   Platelets 113 repeated and verified (*) 145 - 400 10e3/uL   MCV 107.1 (*)  79.5 - 101.0 fL   MCH 35.2 (*) 25.1 - 34.0 pg   MCHC 32.8  31.5 - 36.0 g/dL   RBC 3.23 (*) 3.70 - 5.45 10e6/uL   RDW 19.9 (*) 11.2 - 14.5 %   lymph# 1.3  0.9 - 3.3 10e3/uL   MONO# 0.4  0.1 - 0.9 10e3/uL   Eosinophils Absolute 0.1  0.0 - 0.5 10e3/uL   Basophils Absolute 0.0  0.0 - 0.1 10e3/uL   NEUT% 66.5  38.4 - 76.8 %   LYMPH% 24.3  14.0 - 49.7 %   MONO% 7.5  0.0 - 14.0 %   EOS% 1.1  0.0 - 7.0 %   BASO% 0.6  0.0 - 2.0 %  COMPREHENSIVE METABOLIC PANEL (IH47)      Result Value Ref Range   Sodium 141  136 - 145 mEq/L   Potassium 4.1  3.5 - 5.1 mEq/L   Chloride 98  98 - 109 mEq/L   CO2 29  22 - 29 mEq/L   Glucose 107  70 - 140 mg/dl   BUN 8.3  7.0 - 26.0 mg/dL   Creatinine 0.8  0.6 - 1.1 mg/dL   Total Bilirubin 0.51  0.20 - 1.20 mg/dL   Alkaline Phosphatase 82  40 - 150 U/L   AST 30  5 - 34 U/L   ALT 48  0 - 55 U/L   Total Protein 6.9  6.4 - 8.3 g/dL   Albumin 3.3 (*) 3.5 - 5.0 g/dL   Calcium 9.6  8.4 - 10.4 mg/dL   Anion Gap 14 (*) 3 - 11 mEq/L    After IVF completed and patient left office, stool specimen resulted positive for C diff by PCR.  Studies/Results:  No results found.  Medications: I have reviewed the patient's current medications. Will begin flagyl 500 mg tid, mother notified by phone when C diff resulted.  DISCUSSION: C diff discussed with mother by phone after visit, including need to take antibiotic even if taste disturbance, need to push po hydration, and precautions in home for other family members. They know to call if problems over next couple of days.  Assessment/Plan: 1.C difficile infection: flagyl 500 mg tid x 14 days. May need to delay next chemo if this problem is not resolved by then. She was given one liter IV NS at office today + IV zofran 2.metastatic endometrial carcinoma: now on q 3 week carboplatin, last given 09-10-13 with neulasta. I will see her as scheduled next week 3.morbid obesity 4.PICC in, flushed by family and dressing changes at  this office weekly 5.long term anticoagulation since UE DVT with previous PAC, now with PICC in 6.compression fractures T spine Oct 2014 after fall 7.probable cholelithiasis by scans, not obviously symptomatic 8.GERD improved with protonix and carafate 9.degenerative arthritis T and L spine 10.Peripheral neuropathy in feet with taxol such that we have held that drug       Gordy Levan, MD   09/17/2013, 3:04 PM

## 2013-09-20 ENCOUNTER — Other Ambulatory Visit: Payer: Self-pay | Admitting: Oncology

## 2013-09-23 ENCOUNTER — Ambulatory Visit (HOSPITAL_BASED_OUTPATIENT_CLINIC_OR_DEPARTMENT_OTHER): Payer: BC Managed Care – PPO | Admitting: Oncology

## 2013-09-23 ENCOUNTER — Other Ambulatory Visit (HOSPITAL_BASED_OUTPATIENT_CLINIC_OR_DEPARTMENT_OTHER): Payer: BC Managed Care – PPO

## 2013-09-23 ENCOUNTER — Encounter: Payer: Self-pay | Admitting: Oncology

## 2013-09-23 ENCOUNTER — Ambulatory Visit (HOSPITAL_BASED_OUTPATIENT_CLINIC_OR_DEPARTMENT_OTHER): Payer: BC Managed Care – PPO

## 2013-09-23 VITALS — BP 107/78 | HR 90 | Temp 98.2°F | Resp 18 | Ht 71.0 in | Wt 391.7 lb

## 2013-09-23 DIAGNOSIS — C549 Malignant neoplasm of corpus uteri, unspecified: Secondary | ICD-10-CM

## 2013-09-23 DIAGNOSIS — Z452 Encounter for adjustment and management of vascular access device: Secondary | ICD-10-CM

## 2013-09-23 DIAGNOSIS — C541 Malignant neoplasm of endometrium: Secondary | ICD-10-CM

## 2013-09-23 DIAGNOSIS — I82409 Acute embolism and thrombosis of unspecified deep veins of unspecified lower extremity: Secondary | ICD-10-CM

## 2013-09-23 DIAGNOSIS — A0472 Enterocolitis due to Clostridium difficile, not specified as recurrent: Secondary | ICD-10-CM | POA: Insufficient documentation

## 2013-09-23 LAB — CBC WITH DIFFERENTIAL/PLATELET
BASO%: 0.2 % (ref 0.0–2.0)
BASOS ABS: 0 10*3/uL (ref 0.0–0.1)
EOS%: 0.5 % (ref 0.0–7.0)
Eosinophils Absolute: 0 10*3/uL (ref 0.0–0.5)
HEMATOCRIT: 32.4 % — AB (ref 34.8–46.6)
HEMOGLOBIN: 10.5 g/dL — AB (ref 11.6–15.9)
LYMPH%: 20.6 % (ref 14.0–49.7)
MCH: 35.1 pg — AB (ref 25.1–34.0)
MCHC: 32.4 g/dL (ref 31.5–36.0)
MCV: 108.4 fL — AB (ref 79.5–101.0)
MONO#: 0.4 10*3/uL (ref 0.1–0.9)
MONO%: 5.7 % (ref 0.0–14.0)
NEUT#: 4.7 10*3/uL (ref 1.5–6.5)
NEUT%: 73 % (ref 38.4–76.8)
Platelets: 108 10*3/uL — ABNORMAL LOW (ref 145–400)
RBC: 2.99 10*6/uL — ABNORMAL LOW (ref 3.70–5.45)
RDW: 17.7 % — ABNORMAL HIGH (ref 11.2–14.5)
WBC: 6.4 10*3/uL (ref 3.9–10.3)
lymph#: 1.3 10*3/uL (ref 0.9–3.3)

## 2013-09-23 LAB — COMPREHENSIVE METABOLIC PANEL (CC13)
ALT: 25 U/L (ref 0–55)
AST: 19 U/L (ref 5–34)
Albumin: 3.2 g/dL — ABNORMAL LOW (ref 3.5–5.0)
Alkaline Phosphatase: 84 U/L (ref 40–150)
Anion Gap: 17 mEq/L — ABNORMAL HIGH (ref 3–11)
BUN: 8.1 mg/dL (ref 7.0–26.0)
CALCIUM: 9.1 mg/dL (ref 8.4–10.4)
CHLORIDE: 104 meq/L (ref 98–109)
CO2: 23 mEq/L (ref 22–29)
Creatinine: 0.7 mg/dL (ref 0.6–1.1)
Glucose: 102 mg/dl (ref 70–140)
Potassium: 3.5 mEq/L (ref 3.5–5.1)
Sodium: 143 mEq/L (ref 136–145)
Total Bilirubin: 0.47 mg/dL (ref 0.20–1.20)
Total Protein: 6.7 g/dL (ref 6.4–8.3)

## 2013-09-23 MED ORDER — HEPARIN SOD (PORK) LOCK FLUSH 100 UNIT/ML IV SOLN
500.0000 [IU] | Freq: Once | INTRAVENOUS | Status: AC
  Administered 2013-09-23: 500 [IU] via INTRAVENOUS
  Filled 2013-09-23: qty 5

## 2013-09-23 MED ORDER — SODIUM CHLORIDE 0.9 % IJ SOLN
10.0000 mL | INTRAMUSCULAR | Status: DC | PRN
  Administered 2013-09-23: 10 mL via INTRAVENOUS
  Filled 2013-09-23: qty 10

## 2013-09-23 NOTE — Patient Instructions (Signed)

## 2013-09-23 NOTE — Progress Notes (Signed)
OFFICE PROGRESS NOTE   09/23/2013   Physicians:K.Khan, W.Brewster, Orpah Melter, M.Tsuei   INTERVAL HISTORY:  Patient is seen, together with mother and aunt, in scheduled follow up of recurrent endometrial carcinoma and recent complication of C difficile colitis. Last chemotherapy with single agent carboplatin was given 09-10-13 with neulasta on 09-11-13. The C diff was found on 09-17-13, with unusual diarrhea and much more severe N/V than with previous chemo; she had IVF and began po flagyl on 5-28, planned for 14 days due to immunocompromise with ongoing chemotherapy. She has had no diarrhea in past 3 days, and nausea is back to baseline (needed compazine x2 in last 24 hours). She is eating better and drinking fluids well. She hit dorsum of right foot on furniture a couple of days ago, with more erythema there in last 24 hours. The foot is sore when she walks, ankle ok, but is able to walk. No other bleeding or bruising, still on full dose arixtra. She has PICC.   They have shown me pictures of Dorann's long dark hair prior to chemo, which she cut and donated.   ONCOLOGIC HISTORY 1 Patient developed excessive uterine bleeding. Dilatation and curettage with hysteroscopy documented grade 1 endometrioid endometrial adenocarcinoma. Due to morbid obesity Mirena IUD was placed along with Aygestin. Patient had a dramatic directed weight loss with nutritional support she went from 523 pounds to 359 pounds in 10/2009. On 12/27/2009 she underwent a robotic-assisted laparoscopic hysterectomy, bilateral salpingo-oophorectomy, and mini laparotomy through the umbilical port with morcellation of uterus within about the delivery of the uterus. The final pathology revealed an endometrial adenocarcinoma grade 2 with invasion limited to 1 mm of the myometrium.  #2 Patient did well until 09/2012 when she developed right upper quadrant pain, presumed cholelithiasis. 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 Omental biopsies had metastatic adenocarcinoma consistent with recurrent endometrial carcinoma. She had 12 cycles taxol carboplatin from 11-04-12 thru 07-24-13 with neulasta day 2. CT AP after cycle 12 showed stable to minimally improved disease. With progressive neuropathy, plan is to continue single agent carboplatin for now.  #3 Abdominal ascites with multiple paracenteses.  #4 Back pain and subsequent MRI on 02/05/2013 revealed compression fractures of T10 and T11. Kyphoplasty 02/26/2013.  #6 Patient hospitalized and underwent more MRI's on 12/4 and L1 and L2 compression fractures were noted, after consultation with Dr. Sherwood Gambler, the patient was referred back to interventional radiology for evaluation for kyphoplasty and this was performed on 04/24/13.  #6. Right upper extremity DVT 04/13/13 related to [facility], which was removed. Now on once daily Arixtra.  Review of systems as above, also: No fever. Not unusually weak, again able to walk room to room at home. No new or different pain other than right foot. Mother is concerned about small nodular area on right pinna since previous pressure sore and local infection there; the area is not tender and is not bothering patient. Remainder of 10 point Review of Systems negative.  Objective:  Vital signs in last 24 hours:  BP 107/78  Pulse 90  Temp(Src) 98.2 F (36.8 C) (Oral)  Resp 18  Ht 5\' 11"  (1.803 m)  Wt 391 lb 11.2 oz (177.674 kg)  BMI 54.66 kg/m2 Weight is down 12 lbs. Alert, oriented and appropriate. Looks much more comfortable overall today, in [facility] for all of visit. Respirations not labored RA.  Alopecia, tho hair is growing back a little.  HEENT:PERRL, sclerae not icteric.  Oral mucosa moist without lesions, posterior pharynx clear. Right ear with noninflamed skin tag, no open areas, no significant erythema Neck supple. No JVD.  Lymphatics:no cervical,suraclavicular  adenopathy Resp: clear to auscultation bilaterally and normal percussion bilaterally, tho BS diminished in bases due to habitus Cardio: regular rate and rhythm. No gallop. GI: abdomen obese, soft, nontender, not obviously distended, no appreciable mass or organomegaly. Normally active bowel sounds.  Musculoskeletal/ Extremities: Dorsum of right foot with erythema, no increased heat, central darker area that seems to be ecchymosis, slight puffiness of foot, ankle not tender. otherwise without pitting edema, cords, tenderness. Area of erythema right foot marked. Neuro: no change peripheral neuropathy. Otherwise nonfocal. PSYCH mood and affect improved today Skin right foot as noted, otherwise without rash, ecchymosis, petechiae PICC-without erythema or tenderness  Lab Results:  Results for orders placed in visit on 09/23/13  CBC WITH DIFFERENTIAL      Result Value Ref Range   WBC 6.4  3.9 - 10.3 10e3/uL   NEUT# 4.7  1.5 - 6.5 10e3/uL   HGB 10.5 (*) 11.6 - 15.9 g/dL   HCT 32.4 (*) 34.8 - 46.6 %   Platelets 108 (*) 145 - 400 10e3/uL   MCV 108.4 (*) 79.5 - 101.0 fL   MCH 35.1 (*) 25.1 - 34.0 pg   MCHC 32.4  31.5 - 36.0 g/dL   RBC 2.99 (*) 3.70 - 5.45 10e6/uL   RDW 17.7 (*) 11.2 - 14.5 %   lymph# 1.3  0.9 - 3.3 10e3/uL   MONO# 0.4  0.1 - 0.9 10e3/uL   Eosinophils Absolute 0.0  0.0 - 0.5 10e3/uL   Basophils Absolute 0.0  0.0 - 0.1 10e3/uL   NEUT% 73.0  38.4 - 76.8 %   LYMPH% 20.6  14.0 - 49.7 %   MONO% 5.7  0.0 - 14.0 %   EOS% 0.5  0.0 - 7.0 %   BASO% 0.2  0.0 - 2.0 %  COMPREHENSIVE METABOLIC PANEL (YB01)      Result Value Ref Range   Sodium 143  136 - 145 mEq/L   Potassium 3.5  3.5 - 5.1 mEq/L   Chloride 104  98 - 109 mEq/L   CO2 23  22 - 29 mEq/L   Glucose 102  70 - 140 mg/dl   BUN 8.1  7.0 - 26.0 mg/dL   Creatinine 0.7  0.6 - 1.1 mg/dL   Total Bilirubin 0.47  0.20 - 1.20 mg/dL   Alkaline Phosphatase 84  40 - 150 U/L   AST 19  5 - 34 U/L   ALT 25  0 - 55 U/L   Total Protein  6.7  6.4 - 8.3 g/dL   Albumin 3.2 (*) 3.5 - 5.0 g/dL   Calcium 9.1  8.4 - 10.4 mg/dL   Anion Gap 17 (*) 3 - 11 mEq/L     Studies/Results:  No results found.  Medications: I have reviewed the patient's current medications. She will complete 14 days on po flagyl.  DISCUSSION: we have discussed the C diff, which is obviously improving. I would still prefer to avoid antibiotics if not absolutely necessary, so will watch the foot closely for now,as it may all be bruising from arixtra and mild thrombocytopenia. If the erythema progresses outside or area marked, or increased pain or fever would add antibiotics.  Assessment/Plan: 1.metastatic endometrial carcinoma: history as above, now using q 3 week carboplatin. I will see her 6-12 and treat same day if stable. 2.C difficile infection: symptoms  improved, continue flagyl 500 mg tid x 14 days.  3.morbid obesity  4.PICC in, flushed by family and dressing changes at this office weekly  5.long term anticoagulation since UE DVT with previous PAC, now with PICC in  6.trauma to right foot: doubt fracture, likely extensive bruising but watch closely in case this is some cellulitis. Plan as above. 10.Peripheral neuropathy in feet with taxol such that we have held that drug compression fractures T spine Oct 2014 after fall  7.probable cholelithiasis by scans, not obviously symptomatic  8.GERD improved with protonix and carafate  9.degenerative arthritis T and L spine    Time spent 25 min including >50% counseling and coordination of care  Gordy Levan, MD   09/23/2013, 8:21 PM

## 2013-09-23 NOTE — Progress Notes (Signed)
Insurance paying Neulasta 100%

## 2013-09-25 ENCOUNTER — Telehealth: Payer: Self-pay

## 2013-09-25 ENCOUNTER — Other Ambulatory Visit: Payer: Self-pay | Admitting: Oncology

## 2013-09-25 ENCOUNTER — Telehealth: Payer: Self-pay | Admitting: Oncology

## 2013-09-25 NOTE — Telephone Encounter (Signed)
Pt mother called re: appt for June.  Due to father having cardiac catheterization on June 10, pt is unable to be here on June 10 for dressing change.  Pt mother is requesting to delay the dressing change until 6/12 when pt will be in for treatment.  Advised pt should not be a problem provided she keeps edges reinforced.  Pt voiced understanding.  POF sent.

## 2013-09-25 NOTE — Telephone Encounter (Signed)
appts per 6/3 pof scheduled. per reponse from LL she will see pt 6/12 @ 12:30pm. per 6/5 pof cx 6/10 flush. sw pt mom re next appt for 6/12 and she will get a new schedule when she comes in 6/12.

## 2013-09-27 ENCOUNTER — Other Ambulatory Visit: Payer: Self-pay | Admitting: Oncology

## 2013-09-29 ENCOUNTER — Other Ambulatory Visit: Payer: Self-pay | Admitting: *Deleted

## 2013-09-29 DIAGNOSIS — R1011 Right upper quadrant pain: Secondary | ICD-10-CM

## 2013-09-29 MED ORDER — PROCHLORPERAZINE MALEATE 10 MG PO TABS: ORAL_TABLET | ORAL | Status: DC

## 2013-10-02 ENCOUNTER — Telehealth: Payer: Self-pay | Admitting: Oncology

## 2013-10-02 ENCOUNTER — Ambulatory Visit (HOSPITAL_BASED_OUTPATIENT_CLINIC_OR_DEPARTMENT_OTHER): Payer: BC Managed Care – PPO

## 2013-10-02 ENCOUNTER — Other Ambulatory Visit (HOSPITAL_BASED_OUTPATIENT_CLINIC_OR_DEPARTMENT_OTHER): Payer: BC Managed Care – PPO

## 2013-10-02 ENCOUNTER — Other Ambulatory Visit: Payer: BC Managed Care – PPO

## 2013-10-02 ENCOUNTER — Ambulatory Visit (HOSPITAL_BASED_OUTPATIENT_CLINIC_OR_DEPARTMENT_OTHER): Payer: BC Managed Care – PPO | Admitting: Oncology

## 2013-10-02 ENCOUNTER — Encounter: Payer: Self-pay | Admitting: Oncology

## 2013-10-02 VITALS — BP 93/67 | HR 97 | Temp 97.7°F | Resp 19 | Ht 71.0 in | Wt 393.0 lb

## 2013-10-02 DIAGNOSIS — G620 Drug-induced polyneuropathy: Secondary | ICD-10-CM

## 2013-10-02 DIAGNOSIS — C549 Malignant neoplasm of corpus uteri, unspecified: Secondary | ICD-10-CM

## 2013-10-02 DIAGNOSIS — C779 Secondary and unspecified malignant neoplasm of lymph node, unspecified: Secondary | ICD-10-CM

## 2013-10-02 DIAGNOSIS — C541 Malignant neoplasm of endometrium: Secondary | ICD-10-CM

## 2013-10-02 DIAGNOSIS — Z86718 Personal history of other venous thrombosis and embolism: Secondary | ICD-10-CM

## 2013-10-02 DIAGNOSIS — Z7901 Long term (current) use of anticoagulants: Secondary | ICD-10-CM

## 2013-10-02 DIAGNOSIS — C50919 Malignant neoplasm of unspecified site of unspecified female breast: Secondary | ICD-10-CM

## 2013-10-02 DIAGNOSIS — Z5111 Encounter for antineoplastic chemotherapy: Secondary | ICD-10-CM

## 2013-10-02 LAB — BASIC METABOLIC PANEL (CC13)
ANION GAP: 10 meq/L (ref 3–11)
BUN: 11.6 mg/dL (ref 7.0–26.0)
CALCIUM: 9.8 mg/dL (ref 8.4–10.4)
CO2: 30 mEq/L — ABNORMAL HIGH (ref 22–29)
Chloride: 100 mEq/L (ref 98–109)
Creatinine: 0.7 mg/dL (ref 0.6–1.1)
Glucose: 89 mg/dl (ref 70–140)
POTASSIUM: 4.1 meq/L (ref 3.5–5.1)
SODIUM: 140 meq/L (ref 136–145)

## 2013-10-02 LAB — CBC WITH DIFFERENTIAL/PLATELET
BASO%: 0.5 % (ref 0.0–2.0)
Basophils Absolute: 0 10*3/uL (ref 0.0–0.1)
EOS%: 0.7 % (ref 0.0–7.0)
Eosinophils Absolute: 0 10*3/uL (ref 0.0–0.5)
HCT: 33.9 % — ABNORMAL LOW (ref 34.8–46.6)
HGB: 11.2 g/dL — ABNORMAL LOW (ref 11.6–15.9)
LYMPH%: 26.8 % (ref 14.0–49.7)
MCH: 35.7 pg — ABNORMAL HIGH (ref 25.1–34.0)
MCHC: 33.1 g/dL (ref 31.5–36.0)
MCV: 107.9 fL — AB (ref 79.5–101.0)
MONO#: 0.6 10*3/uL (ref 0.1–0.9)
MONO%: 10.2 % (ref 0.0–14.0)
NEUT%: 61.8 % (ref 38.4–76.8)
NEUTROS ABS: 3.5 10*3/uL (ref 1.5–6.5)
PLATELETS: 120 10*3/uL — AB (ref 145–400)
RBC: 3.14 10*6/uL — ABNORMAL LOW (ref 3.70–5.45)
RDW: 18.6 % — AB (ref 11.2–14.5)
WBC: 5.6 10*3/uL (ref 3.9–10.3)
lymph#: 1.5 10*3/uL (ref 0.9–3.3)

## 2013-10-02 MED ORDER — SODIUM CHLORIDE 0.9 % IV SOLN
150.0000 mg | Freq: Once | INTRAVENOUS | Status: AC
  Administered 2013-10-02: 150 mg via INTRAVENOUS
  Filled 2013-10-02: qty 5

## 2013-10-02 MED ORDER — DEXAMETHASONE SODIUM PHOSPHATE 20 MG/5ML IJ SOLN
INTRAMUSCULAR | Status: AC
  Filled 2013-10-02: qty 5

## 2013-10-02 MED ORDER — DEXAMETHASONE SODIUM PHOSPHATE 20 MG/5ML IJ SOLN
12.0000 mg | Freq: Once | INTRAMUSCULAR | Status: AC
  Administered 2013-10-02: 12 mg via INTRAVENOUS

## 2013-10-02 MED ORDER — SODIUM CHLORIDE 0.9 % IV SOLN
900.0000 mg | Freq: Once | INTRAVENOUS | Status: AC
  Administered 2013-10-02: 900 mg via INTRAVENOUS
  Filled 2013-10-02: qty 90

## 2013-10-02 MED ORDER — SODIUM CHLORIDE 0.9 % IV SOLN
Freq: Once | INTRAVENOUS | Status: AC
  Administered 2013-10-02: 13:00:00 via INTRAVENOUS

## 2013-10-02 MED ORDER — SODIUM CHLORIDE 0.9 % IJ SOLN
10.0000 mL | INTRAMUSCULAR | Status: DC | PRN
  Filled 2013-10-02: qty 10

## 2013-10-02 MED ORDER — CARBOPLATIN CHEMO INTRADERMAL TEST DOSE 100MCG/0.02ML
100.0000 ug | Freq: Once | INTRADERMAL | Status: AC
  Administered 2013-10-02: 100 ug via INTRADERMAL
  Filled 2013-10-02: qty 0.01

## 2013-10-02 MED ORDER — ONDANSETRON 16 MG/50ML IVPB (CHCC)
INTRAVENOUS | Status: AC
  Filled 2013-10-02: qty 16

## 2013-10-02 MED ORDER — HEPARIN SOD (PORK) LOCK FLUSH 100 UNIT/ML IV SOLN
250.0000 [IU] | Freq: Once | INTRAVENOUS | Status: DC | PRN
  Filled 2013-10-02: qty 5

## 2013-10-02 MED ORDER — ONDANSETRON 16 MG/50ML IVPB (CHCC)
16.0000 mg | Freq: Once | INTRAVENOUS | Status: AC
  Administered 2013-10-02: 16 mg via INTRAVENOUS

## 2013-10-02 NOTE — Progress Notes (Signed)
OFFICE PROGRESS NOTE   10/02/2013   Physicians:K.Khan, W.Brewster, Orpah Melter, M.Tsuei   INTERVAL HISTORY:  Patient is seen, together with mother and aunt, in continuing attention to recurrent endometrial carcinoma, now being treated with carboplatin every 3 weeks, due again today. She completed 14 days of oral flagyl for C difficile diarrhea, which was diagnosed just after last chemotherapy; bowels are now moving once daily with formed stools. She is feeling better overall today " I feel good". Bruised foot has progressively improved, no longer painful. She has had no other bleeding. She has no significant nausea now, and plans to use increased antiemetics this cycle as previously discussed.   ONCOLOGIC HISTORY 1 Patient developed excessive uterine bleeding. Dilatation and curettage with hysteroscopy documented grade 1 endometrioid endometrial adenocarcinoma. Due to morbid obesity Mirena IUD was placed along with Aygestin. Patient had a dramatic directed weight loss with nutritional support she went from 523 pounds to 359 pounds in 10/2009. On 12/27/2009 she underwent a robotic-assisted laparoscopic hysterectomy, bilateral salpingo-oophorectomy, and mini laparotomy through the umbilical port with morcellation of uterus within about the delivery of the uterus. The final pathology revealed an endometrial adenocarcinoma grade 2 with invasion limited to 1 mm of the myometrium.  #2 Patient did well until 09/2012 when she developed right upper quadrant pain, presumed cholelithiasis. 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 Omental biopsies had metastatic adenocarcinoma consistent with recurrent endometrial carcinoma. She had 12 cycles taxol carboplatin from 11-04-12 thru 07-24-13 with neulasta day 2. CT AP after cycle 12 showed stable to minimally improved disease. With progressive neuropathy, plan is to continue single agent  carboplatin for now.  #3 Abdominal ascites with multiple paracenteses.  #4 Back pain and subsequent MRI on 02/05/2013 revealed compression fractures of T10 and T11. Kyphoplasty 02/26/2013.  #6 Patient hospitalized and underwent more MRI's on 12/4 and L1 and L2 compression fractures were noted, after consultation with Dr. Sherwood Gambler, the patient was referred back to interventional radiology for evaluation for kyphoplasty and this was performed on 04/24/13.  #6. Right upper extremity DVT 04/13/13 related to [facility], which was removed. Now on once daily Arixtra  Review of systems as above, also: Walking a little at home. Denies increased SOB. Right ear not painful. No problems with PICC. Remainder of 10 point Review of Systems negative.  Objective:  Vital signs in last 24 hours:  BP 93/67  Pulse 97  Temp(Src) 97.7 F (36.5 C) (Oral)  Resp 19  Ht 5\' 11"  (1.803 m)  Wt 393 lb (178.264 kg)  BMI 54.84 kg/m2 Weight is up 2 lbs. Alert, oriented and appropriate. In WC Hair is growing back.  HEENT:PERRL, sclerae not icteric. Oral mucosa moist without lesions, posterior pharynx clear. RIght ear not remarkable Neck supple. No JVD.  Lymphatics:no cervical,suraclavicular adenopathy Resp: clear to auscultation bilaterally and normal percussion bilaterally Cardio: regular rate and rhythm. No gallop. GI: abdomen obese, soft, nontender, not obviously more distended, unable to appreciate mass or organomegaly. Some bowel sounds.  Musculoskeletal/ Extremities: without pitting edema, cords, tenderness. Chronic discoloration lower legs bilaterally. Resolving ecchymosis dorsum right foot. Neuro: no increased peripheral neuropathy. Otherwise nonfocal. PSYCH: good eye contact, mood and affect appropriate. Skin otherwise without rash, ecchymosis, petechiae PICC without erythema or tenderness at insertion. No swelling LUE  Lab Results:  Results for orders placed in visit on 10/02/13  CBC WITH DIFFERENTIAL       Result Value Ref Range   WBC 5.6  3.9 - 10.3 10e3/uL   NEUT# 3.5  1.5 - 6.5 10e3/uL   HGB 11.2 (*) 11.6 - 15.9 g/dL   HCT 33.9 (*) 34.8 - 46.6 %   Platelets 120 (*) 145 - 400 10e3/uL   MCV 107.9 (*) 79.5 - 101.0 fL   MCH 35.7 (*) 25.1 - 34.0 pg   MCHC 33.1  31.5 - 36.0 g/dL   RBC 3.14 (*) 3.70 - 5.45 10e6/uL   RDW 18.6 (*) 11.2 - 14.5 %   lymph# 1.5  0.9 - 3.3 10e3/uL   MONO# 0.6  0.1 - 0.9 10e3/uL   Eosinophils Absolute 0.0  0.0 - 0.5 10e3/uL   Basophils Absolute 0.0  0.0 - 0.1 10e3/uL   NEUT% 61.8  38.4 - 76.8 %   LYMPH% 26.8  14.0 - 49.7 %   MONO% 10.2  0.0 - 14.0 %   EOS% 0.7  0.0 - 7.0 %   BASO% 0.5  0.0 - 2.0 %  BASIC METABOLIC PANEL (RW43)      Result Value Ref Range   Sodium 140  136 - 145 mEq/L   Potassium 4.1  3.5 - 5.1 mEq/L   Chloride 100  98 - 109 mEq/L   CO2 30 (*) 22 - 29 mEq/L   Glucose 89  70 - 140 mg/dl   BUN 11.6  7.0 - 26.0 mg/dL   Creatinine 0.7  0.6 - 1.1 mg/dL   Calcium 9.8  8.4 - 10.4 mg/dL   Anion Gap 10  3 - 11 mEq/L     Studies/Results:  No results found.  Medications: I have reviewed the patient's current medications. She prefers neulasta day 2, as she generally feels well enough to leave house on day 2.  DISCUSSION: patient and family are aware that C diff sometimes can be difficult to clear, so will let us know promptly if recurrent diarrhea; even so, she does appear stable for chemotherapy today and wants to proceed.  Assessment/Plan: 1.metastatic endometrial carcinoma with mostly pelvic involvement by last imaging, stable to slightly improved: will continue chemotherapy with carboplatin q 3 weeks as tolerated, with neulasta. Antiemetics increased (did not help last cycle due to concurrent C diff, so hopefully will improve symptoms this cycle). I will see her back prior to next treatment in 3 weeks.  3.PICC in: mother flushes daily, dressing changes at [facility] weekly  4.morbid obesity  5.long term anticoagulation since PAC-associated DVT in Dec  2014, now using single injection of arixtra daily 6.T spine compression fractures Oct 2014 after fall, post kyphoplasties. 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 Continuing chemo as single agent carboplatin now.  9. 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 Need to clarify code status    Patient and family understood discussion and are pleased that she is feeling better. Chemo orders confirmed, neulasta date changed.    Clarece Drzewiecki P, MD   10/02/2013, 1:06 PM

## 2013-10-02 NOTE — Progress Notes (Signed)
Injection ap

## 2013-10-02 NOTE — Progress Notes (Signed)
1335: Carbo test dose negative at 5 mintues 1345: Carbo test dose negative at 15 minutes 1400: Carbo test dose negative at 30 minutes Will proceed with premeds and chemotherapy.   Patient cut her upper thigh near perineal area while toileting on bedside commode. It appears skin caught between plastic parts. Minor injury, with bleeding, now cleaned and dressed with gauze and tegaderm. Dressing materials given to pt's mother with instructions when to change dressing and inspect site. Commode inspected with no obvious broken or jagged parts. Mother to use towels for protection with next toileting. Dr. Marko Plume notified via in-basket message with the above information and request that MD call infusion room with further instructions. Maggie notified as well. Patient in no acute distress. SZP to follow.

## 2013-10-02 NOTE — Patient Instructions (Signed)
[facility] Discharge Instructions for Patients Receiving Chemotherapy  Today you received the following chemotherapy agent: Carboplatin   To help prevent nausea and vomiting after your treatment, we encourage you to take your nausea medication as prescribed.    If you develop nausea and vomiting that is not controlled by your nausea medication, call the clinic.   BELOW ARE SYMPTOMS THAT SHOULD BE REPORTED IMMEDIATELY:  *FEVER GREATER THAN 100.5 F  *CHILLS WITH OR WITHOUT FEVER  NAUSEA AND VOMITING THAT IS NOT CONTROLLED WITH YOUR NAUSEA MEDICATION  *UNUSUAL SHORTNESS OF BREATH  *UNUSUAL BRUISING OR BLEEDING  TENDERNESS IN MOUTH AND THROAT WITH OR WITHOUT PRESENCE OF ULCERS  *URINARY PROBLEMS  *BOWEL PROBLEMS  UNUSUAL RASH Items with * indicate a potential emergency and should be followed up as soon as possible.  Feel free to call the clinic you have any questions or concerns. The clinic phone number is [phone].

## 2013-10-02 NOTE — Telephone Encounter (Signed)
per pof to chge inj from 6/13 to 6/15-dressing chgd today-moved appts to the 19 & 26th  as a weekly chge to be back on weekly sch per pof-gave pt updated copy of sch

## 2013-10-02 NOTE — Progress Notes (Signed)
Injection appointment to be changed from 10/05/13 to 10/03/13, per Dr. Marko Plume.   Patient to scheduling to get appointment time, at discharge.  Patient verbalized understanding.

## 2013-10-03 ENCOUNTER — Ambulatory Visit (HOSPITAL_BASED_OUTPATIENT_CLINIC_OR_DEPARTMENT_OTHER): Payer: BC Managed Care – PPO

## 2013-10-03 VITALS — BP 92/59 | HR 80 | Temp 97.6°F | Resp 19

## 2013-10-03 DIAGNOSIS — C541 Malignant neoplasm of endometrium: Secondary | ICD-10-CM

## 2013-10-03 DIAGNOSIS — C549 Malignant neoplasm of corpus uteri, unspecified: Secondary | ICD-10-CM

## 2013-10-03 DIAGNOSIS — Z5189 Encounter for other specified aftercare: Secondary | ICD-10-CM

## 2013-10-03 MED ORDER — PEGFILGRASTIM INJECTION 6 MG/0.6ML
6.0000 mg | Freq: Once | SUBCUTANEOUS | Status: AC
  Administered 2013-10-03: 6 mg via SUBCUTANEOUS

## 2013-10-05 ENCOUNTER — Ambulatory Visit: Payer: BC Managed Care – PPO

## 2013-10-08 ENCOUNTER — Telehealth: Payer: Self-pay | Admitting: *Deleted

## 2013-10-08 ENCOUNTER — Other Ambulatory Visit: Payer: Self-pay | Admitting: Oncology

## 2013-10-08 DIAGNOSIS — R197 Diarrhea, unspecified: Secondary | ICD-10-CM

## 2013-10-08 DIAGNOSIS — C549 Malignant neoplasm of corpus uteri, unspecified: Secondary | ICD-10-CM

## 2013-10-08 NOTE — Telephone Encounter (Signed)
Patient mother calling to let us know that [name] has had increase in frequency of stools starting today. They are beginning to have looser consistency. Mother asking if Dr Marko Plume would want to restart the antibiotic she started on 3 weeks ago, wondering if this "could be infection coming back" Patient has appt tomorrow on 10/09/2013 for flush.  Encouraged to continue to eat and drink well tonight, we will have Dr Marko Plume review and have a plan when she comes in tomorrow, if not before. Encouraged to call back if symptoms become worse or patient develops fever or other symptoms.

## 2013-10-09 ENCOUNTER — Other Ambulatory Visit: Payer: Self-pay | Admitting: Oncology

## 2013-10-09 ENCOUNTER — Ambulatory Visit (HOSPITAL_BASED_OUTPATIENT_CLINIC_OR_DEPARTMENT_OTHER): Payer: BC Managed Care – PPO

## 2013-10-09 ENCOUNTER — Telehealth: Payer: Self-pay

## 2013-10-09 ENCOUNTER — Other Ambulatory Visit: Payer: Self-pay

## 2013-10-09 ENCOUNTER — Ambulatory Visit: Payer: BC Managed Care – PPO

## 2013-10-09 DIAGNOSIS — R197 Diarrhea, unspecified: Secondary | ICD-10-CM

## 2013-10-09 DIAGNOSIS — Z452 Encounter for adjustment and management of vascular access device: Secondary | ICD-10-CM

## 2013-10-09 DIAGNOSIS — C549 Malignant neoplasm of corpus uteri, unspecified: Secondary | ICD-10-CM

## 2013-10-09 DIAGNOSIS — A0472 Enterocolitis due to Clostridium difficile, not specified as recurrent: Secondary | ICD-10-CM

## 2013-10-09 LAB — CLOSTRIDIUM DIFFICILE BY PCR: Toxigenic C. Difficile by PCR: POSITIVE — CR

## 2013-10-09 MED ORDER — SODIUM CHLORIDE 0.9 % IJ SOLN
10.0000 mL | INTRAMUSCULAR | Status: DC | PRN
  Administered 2013-10-09: 10 mL via INTRAVENOUS
  Filled 2013-10-09: qty 10

## 2013-10-09 MED ORDER — HEPARIN SOD (PORK) LOCK FLUSH 100 UNIT/ML IV SOLN
500.0000 [IU] | Freq: Once | INTRAVENOUS | Status: AC
  Administered 2013-10-09: 250 [IU] via INTRAVENOUS
  Filled 2013-10-09: qty 5

## 2013-10-09 MED ORDER — METRONIDAZOLE 500 MG PO TABS: 500.0000 mg | ORAL_TABLET | Freq: Three times a day (TID) | ORAL | Status: DC

## 2013-10-09 NOTE — Progress Notes (Signed)
Patient requested bedside commode for stool specimen collection, Called infusion for bedside commode to be brought to breast side. Louise,RN informed, patient in breast center waiting room waiting for further instruction from [place], [place].

## 2013-10-09 NOTE — Telephone Encounter (Signed)
Message copied by Baruch Merl on Fri Oct 09, 2013  5:49 PM ------      Message from: Evlyn Clines P      Created: Thu Oct 08, 2013  5:31 PM       If still diarrhea when she comes for PICC flush on 6-19, order is in for C diff to be repeated.      thanks ------

## 2013-10-09 NOTE — Patient Instructions (Signed)
PICC Home Guide A peripherally inserted central catheter (PICC) is a long, thin, flexible tube that is inserted into a vein in the upper arm. It is a form of intravenous (IV) access. It is considered to be a "central" line because the tip of the PICC ends in a large vein in your chest. This large vein is called the superior vena cava (SVC). The PICC tip ends in the SVC because there is a lot of blood flow in the SVC. This allows medicines and IV fluids to be quickly distributed throughout the body. The PICC is inserted using a sterile technique by a specially trained nurse or physician. After the PICC is inserted, a chest X-ray exam is done to be sure it is in the correct place.  A PICC may be placed for different reasons, such as:  To give medicines and liquid nutrition that can only be given through a central line. Examples are:  Certain antibiotic treatments.  Chemotherapy.  Total parenteral nutrition (TPN).  To take frequent blood samples.  To give IV fluids and blood products.  If there is difficulty placing a peripheral intravenous (PIV) catheter. If taken care of properly, a PICC can remain in place for several months. A PICC can also allow a person to go home from the hospital early. Medicine and PICC care can be managed at home by a family member or home health care team. WHAT PROBLEMS CAN HAPPEN WHEN I HAVE A PICC? Problems with a PICC can occasionally occur. These may include:  A blood clot (thrombus) forming in or at the tip of the PICC. This can cause the PICC to become clogged. A clot-dissolving medicine called tissue plasminogen activator (tPA) can be given through the PICC to help break up the clot.  Inflammation of the vein (phlebitis) in which the PICC is placed. Signs of inflammation may include redness, pain at the insertion site, red streaks, or being able to feel a "cord" in the vein where the PICC is located.  Infection in the PICC or at the insertion site. Signs of  infection may include fever, chills, redness, swelling, or pus drainage from the PICC insertion site.  PICC movement (malposition). The PICC tip may move from its original position due to excessive physical activity, forceful coughing, sneezing, or vomiting.  A break or cut in the PICC. It is important to not use scissors near the PICC.  Nerve or tendon irritation or injury during PICC insertion. WHAT SHOULD I KEEP IN MIND ABOUT ACTIVITIES WHEN I HAVE A PICC?  You may bend your arm and move it freely. If your PICC is near or at the bend of your elbow, avoid activity with repeated motion at the elbow.  Rest at home for the remainder of the day following PICC line insertion.  Avoid lifting heavy objects as instructed by your health care provider.  Avoid using a crutch with the arm on the same side as your PICC. You may need to use a walker. WHAT SHOULD I KNOW ABOUT MY PICC DRESSING?  Keep your PICC bandage (dressing) clean and dry to prevent infection.  Ask your health care provider when you may shower. Ask your health care provider to teach you how to wrap the PICC when you do take a shower.  Change the PICC dressing as instructed by your health care provider.  Change your PICC dressing if it becomes loose or wet. WHAT SHOULD I KNOW ABOUT PICC CARE?  Check the PICC insertion site daily for   leakage, redness, swelling, or pain.  Do not take a bath, swim, or use hot tubs when you have a PICC. Cover PICC line with clear plastic wrap and tape to keep it dry while showering.  Flush the PICC as directed by your health care provider. Let your health care provider know right away if the PICC is difficult to flush or does not flush. Do not use force to flush the PICC.  Do not use a syringe that is less than 10 mL to flush the PICC.  Never pull or tug on the PICC.  Avoid blood pressure checks on the arm with the PICC.  Keep your PICC identification card with you at all times.  Do not  take the PICC out yourself. Only a trained clinical professional should remove the PICC. SEEK IMMEDIATE MEDICAL CARE IF:  Your PICC is accidently pulled all the way out. If this happens, cover the insertion site with a bandage or gauze dressing. Do not throw the PICC away. Your health care provider will need to inspect it.  Your PICC was tugged or pulled and has partially come out. Do not  push the PICC back in.  There is any type of drainage, redness, or swelling where the PICC enters the skin.  You cannot flush the PICC, it is difficult to flush, or the PICC leaks around the insertion site when it is flushed.  You hear a "flushing" sound when the PICC is flushed.  You have pain, discomfort, or numbness in your arm, shoulder, or jaw on the same side as the PICC.  You feel your heart "racing" or skipping beats.  You notice a hole or tear in the PICC.  You develop chills or a fever. MAKE SURE YOU:   Understand these instructions.  Will watch your condition.  Will get help right away if you are not doing well or get worse. Document Released: 10/14/2002 Document Revised: 01/28/2013 Document Reviewed: 12/15/2012 ExitCare Patient Information 2015 [employer]. This information is not intended to replace advice given to you by your health care provider. Make sure you discuss any questions you have with your health care provider.  

## 2013-10-09 NOTE — Telephone Encounter (Signed)
Received a call ~1700 from [facility] lab [place] that the stool specimen from this afternoon was positive for C-Diff. Dr. Marko Plume ordered Flagyl 500 mg tid x 14 days.  Prescription sent to [name]. She is to push fluids Gatorade would be great. Call next week~ Tuesday  with an update on bowels.  She is to call the office or go to the ED over the weekend if needed. Spoke with Ms. [facility] Hope Pigeon as patient's mother took her father to the ED. She will pick up prescription for Ms. Mccaig.

## 2013-10-12 ENCOUNTER — Other Ambulatory Visit: Payer: Self-pay | Admitting: Oncology

## 2013-10-12 DIAGNOSIS — C541 Malignant neoplasm of endometrium: Secondary | ICD-10-CM

## 2013-10-13 ENCOUNTER — Telehealth: Payer: Self-pay | Admitting: *Deleted

## 2013-10-13 NOTE — Telephone Encounter (Signed)
Asking if OK to skip the PICC dressing change on 6/26 (last changed 6/19) and wait till her office visit on 6/29 with Dr. Marko Plume. Reports it is difficult to get her out of home. Informed her it is not advisable to leave dressing on longer that 7 days due to infection risk. Will review schedule and see if something can be arranged.

## 2013-10-13 NOTE — Telephone Encounter (Signed)
Made aunt aware that the Monday appointment with MD can be moved to Friday, 6/26 at 2:30 lab/ 2:45 PICC drsg change/ 3:15 with Dr. Marko Plume. Is appreciative of change and will notify Tamela Oddi when she returns home.

## 2013-10-15 ENCOUNTER — Other Ambulatory Visit: Payer: Self-pay | Admitting: Oncology

## 2013-10-16 ENCOUNTER — Encounter: Payer: Self-pay | Admitting: Oncology

## 2013-10-16 ENCOUNTER — Other Ambulatory Visit (HOSPITAL_BASED_OUTPATIENT_CLINIC_OR_DEPARTMENT_OTHER): Payer: BC Managed Care – PPO

## 2013-10-16 ENCOUNTER — Ambulatory Visit: Payer: BC Managed Care – PPO

## 2013-10-16 ENCOUNTER — Ambulatory Visit (HOSPITAL_BASED_OUTPATIENT_CLINIC_OR_DEPARTMENT_OTHER): Payer: BC Managed Care – PPO | Admitting: Oncology

## 2013-10-16 VITALS — BP 119/73 | HR 106 | Temp 98.0°F | Resp 18 | Ht 71.0 in | Wt 395.7 lb

## 2013-10-16 DIAGNOSIS — C50919 Malignant neoplasm of unspecified site of unspecified female breast: Secondary | ICD-10-CM

## 2013-10-16 DIAGNOSIS — C541 Malignant neoplasm of endometrium: Secondary | ICD-10-CM

## 2013-10-16 DIAGNOSIS — R197 Diarrhea, unspecified: Secondary | ICD-10-CM

## 2013-10-16 DIAGNOSIS — C549 Malignant neoplasm of corpus uteri, unspecified: Secondary | ICD-10-CM

## 2013-10-16 DIAGNOSIS — C779 Secondary and unspecified malignant neoplasm of lymph node, unspecified: Secondary | ICD-10-CM

## 2013-10-16 LAB — CBC WITH DIFFERENTIAL/PLATELET
BASO%: 1.2 % (ref 0.0–2.0)
Basophils Absolute: 0.1 10*3/uL (ref 0.0–0.1)
EOS%: 0.9 % (ref 0.0–7.0)
Eosinophils Absolute: 0 10*3/uL (ref 0.0–0.5)
HCT: 32.8 % — ABNORMAL LOW (ref 34.8–46.6)
HGB: 10.8 g/dL — ABNORMAL LOW (ref 11.6–15.9)
LYMPH%: 25.1 % (ref 14.0–49.7)
MCH: 36.1 pg — AB (ref 25.1–34.0)
MCHC: 33 g/dL (ref 31.5–36.0)
MCV: 109.6 fL — AB (ref 79.5–101.0)
MONO#: 0.5 10*3/uL (ref 0.1–0.9)
MONO%: 8.1 % (ref 0.0–14.0)
NEUT#: 3.7 10*3/uL (ref 1.5–6.5)
NEUT%: 64.7 % (ref 38.4–76.8)
Platelets: 108 10*3/uL — ABNORMAL LOW (ref 145–400)
RBC: 2.99 10*6/uL — AB (ref 3.70–5.45)
RDW: 18.5 % — AB (ref 11.2–14.5)
WBC: 5.7 10*3/uL (ref 3.9–10.3)
lymph#: 1.4 10*3/uL (ref 0.9–3.3)

## 2013-10-16 LAB — COMPREHENSIVE METABOLIC PANEL (CC13)
ALK PHOS: 89 U/L (ref 40–150)
ALT: 17 U/L (ref 0–55)
AST: 17 U/L (ref 5–34)
Albumin: 3.3 g/dL — ABNORMAL LOW (ref 3.5–5.0)
Anion Gap: 10 mEq/L (ref 3–11)
BUN: 6.8 mg/dL — ABNORMAL LOW (ref 7.0–26.0)
CO2: 28 mEq/L (ref 22–29)
Calcium: 9.2 mg/dL (ref 8.4–10.4)
Chloride: 103 mEq/L (ref 98–109)
Creatinine: 0.7 mg/dL (ref 0.6–1.1)
Glucose: 129 mg/dl (ref 70–140)
POTASSIUM: 3.7 meq/L (ref 3.5–5.1)
SODIUM: 142 meq/L (ref 136–145)
Total Bilirubin: 0.34 mg/dL (ref 0.20–1.20)
Total Protein: 6.5 g/dL (ref 6.4–8.3)

## 2013-10-16 MED ORDER — SODIUM CHLORIDE 0.9 % IJ SOLN
10.0000 mL | INTRAMUSCULAR | Status: AC | PRN
  Administered 2013-10-16: 10 mL
  Filled 2013-10-16: qty 10

## 2013-10-16 MED ORDER — HEPARIN SOD (PORK) LOCK FLUSH 100 UNIT/ML IV SOLN
250.0000 [IU] | INTRAVENOUS | Status: AC | PRN
  Administered 2013-10-16: 250 [IU]
  Filled 2013-10-16: qty 5

## 2013-10-16 NOTE — Patient Instructions (Signed)
PICC Home Guide A peripherally inserted central catheter (PICC) is a long, thin, flexible tube that is inserted into a vein in the upper arm. It is a form of intravenous (IV) access. It is considered to be a "central" line because the tip of the PICC ends in a large vein in your chest. This large vein is called the superior vena cava (SVC). The PICC tip ends in the SVC because there is a lot of blood flow in the SVC. This allows medicines and IV fluids to be quickly distributed throughout the body. The PICC is inserted using a sterile technique by a specially trained nurse or physician. After the PICC is inserted, a chest X-ray exam is done to be sure it is in the correct place.  A PICC may be placed for different reasons, such as:  To give medicines and liquid nutrition that can only be given through a central line. Examples are:  Certain antibiotic treatments.  Chemotherapy.  Total parenteral nutrition (TPN).  To take frequent blood samples.  To give IV fluids and blood products.  If there is difficulty placing a peripheral intravenous (PIV) catheter. If taken care of properly, a PICC can remain in place for several months. A PICC can also allow a person to go home from the hospital early. Medicine and PICC care can be managed at home by a family member or home health care team. WHAT PROBLEMS CAN HAPPEN WHEN I HAVE A PICC? Problems with a PICC can occasionally occur. These may include:  A blood clot (thrombus) forming in or at the tip of the PICC. This can cause the PICC to become clogged. A clot-dissolving medicine called tissue plasminogen activator (tPA) can be given through the PICC to help break up the clot.  Inflammation of the vein (phlebitis) in which the PICC is placed. Signs of inflammation may include redness, pain at the insertion site, red streaks, or being able to feel a "cord" in the vein where the PICC is located.  Infection in the PICC or at the insertion site. Signs of  infection may include fever, chills, redness, swelling, or pus drainage from the PICC insertion site.  PICC movement (malposition). The PICC tip may move from its original position due to excessive physical activity, forceful coughing, sneezing, or vomiting.  A break or cut in the PICC. It is important to not use scissors near the PICC.  Nerve or tendon irritation or injury during PICC insertion. WHAT SHOULD I KEEP IN MIND ABOUT ACTIVITIES WHEN I HAVE A PICC?  You may bend your arm and move it freely. If your PICC is near or at the bend of your elbow, avoid activity with repeated motion at the elbow.  Rest at home for the remainder of the day following PICC line insertion.  Avoid lifting heavy objects as instructed by your health care Dylana Shaw.  Avoid using a crutch with the arm on the same side as your PICC. You may need to use a walker. WHAT SHOULD I KNOW ABOUT MY PICC DRESSING?  Keep your PICC bandage (dressing) clean and dry to prevent infection.  Ask your health care Ayse Mccartin when you may shower. Ask your health care Nakeya Adinolfi to teach you how to wrap the PICC when you do take a shower.  Change the PICC dressing as instructed by your health care Meko Masterson.  Change your PICC dressing if it becomes loose or wet. WHAT SHOULD I KNOW ABOUT PICC CARE?  Check the PICC insertion site daily for   leakage, redness, swelling, or pain.  Do not take a bath, swim, or use hot tubs when you have a PICC. Cover PICC line with clear plastic wrap and tape to keep it dry while showering.  Flush the PICC as directed by your health care Sinjin Amero. Let your health care Laci Frenkel know right away if the PICC is difficult to flush or does not flush. Do not use force to flush the PICC.  Do not use a syringe that is less than 10 mL to flush the PICC.  Never pull or tug on the PICC.  Avoid blood pressure checks on the arm with the PICC.  Keep your PICC identification card with you at all times.  Do not  take the PICC out yourself. Only a trained clinical professional should remove the PICC. SEEK IMMEDIATE MEDICAL CARE IF:  Your PICC is accidently pulled all the way out. If this happens, cover the insertion site with a bandage or gauze dressing. Do not throw the PICC away. Your health care Darryl Willner will need to inspect it.  Your PICC was tugged or pulled and has partially come out. Do not  push the PICC back in.  There is any type of drainage, redness, or swelling where the PICC enters the skin.  You cannot flush the PICC, it is difficult to flush, or the PICC leaks around the insertion site when it is flushed.  You hear a "flushing" sound when the PICC is flushed.  You have pain, discomfort, or numbness in your arm, shoulder, or jaw on the same side as the PICC.  You feel your heart "racing" or skipping beats.  You notice a hole or tear in the PICC.  You develop chills or a fever. MAKE SURE YOU:   Understand these instructions.  Will watch your condition.  Will get help right away if you are not doing well or get worse. Document Released: 10/14/2002 Document Revised: 01/28/2013 Document Reviewed: 12/15/2012 ExitCare Patient Information 2015 [employer]. This information is not intended to replace advice given to you by your health care Jocee Kissick. Make sure you discuss any questions you have with your health care Vaudie Engebretsen.  

## 2013-10-16 NOTE — Progress Notes (Signed)
OFFICE PROGRESS NOTE   10/16/2013   Physicians:K.Khan, W.Brewster, Orpah Melter, M.Tsuei   INTERVAL HISTORY:  Patient is seen, together with mother and aunt, in continuing attention to recurrent endometrial carcinoma which is being treated now with carboplatin, course complicated by first recurrence of C.diff diarrhea such that she is back on oral Flagyl. Most recent carboplatin was given 10-02-13. C diff was initially diagnosed on 09-17-13, ~ 5 days after chemotherapy. She had clinical good response to 14 days of Flagyl 500 mg tid, and was entirely compliant with that medication. She had next carboplatin on 10-02-13, initially did better with changes in antiemetics, then developed frequent loose stools and abdominal cramping on 10-09-13, with + C diff documented and another planned 14 day course of Flagyl begun that day. Bowel movements are back to normal and abdominal cramping has resolved, now day 6 of this course. She feels well otherwise also today, without nausea/ vomiting or other symptoms that seem related to these problems. She is ambulating just a little at home, as is baseline.   ONCOLOGIC HISTORY 1 Patient developed excessive uterine bleeding. Dilatation and curettage with hysteroscopy documented grade 1 endometrioid endometrial adenocarcinoma. Due to morbid obesity Mirena IUD was placed along with Aygestin. Patient had a dramatic directed weight loss with nutritional support she went from 523 pounds to 359 pounds in 10/2009. On 12/27/2009 she underwent a robotic-assisted laparoscopic hysterectomy, bilateral salpingo-oophorectomy, and mini laparotomy through the umbilical port with morcellation of uterus within about the delivery of the uterus. The final pathology revealed an endometrial adenocarcinoma grade 2 with invasion limited to 1 mm of the myometrium.  #2 Patient did well until 09/2012 when she developed right upper quadrant pain, presumed cholelithiasis. 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 Omental biopsies had metastatic adenocarcinoma consistent with recurrent endometrial carcinoma. She had 12 cycles taxol carboplatin from 11-04-12 thru 07-24-13 with neulasta day 2. CT AP after cycle 12 showed stable to minimally improved disease. With progressive neuropathy, plan is to continue single agent carboplatin for now.  #3 Abdominal ascites with multiple paracenteses.  #4 Back pain and subsequent MRI on 02/05/2013 revealed compression fractures of T10 and T11. Kyphoplasty 02/26/2013.  #6 Patient hospitalized and underwent more MRI's on 12/4 and L1 and L2 compression fractures were noted, after consultation with Dr. Sherwood Gambler, the patient was referred back to interventional radiology for evaluation for kyphoplasty and this was performed on 04/24/13.  #6. Right upper extremity DVT 04/13/13 related to [facility], which was removed. Now on once daily Arixtra  Review of systems as above, also: No fever, taking po fluids adequately. Less swelling LE. No increased SOB.  Remainder of 10 point Review of Systems negative.  Objective:  Vital signs in last 24 hours:  BP 119/73  Pulse 106  Temp(Src) 98 F (36.7 C) (Oral)  Resp 18  Ht 5\' 11"  (1.803 m)  Wt 395 lb 11.2 oz (179.488 kg)  BMI 55.21 kg/m2 weight is up 2 lbs.  Alert, oriented and appropriate. Comfortable in WC on RA. Partial alopecia  HEENT:PERRL, sclerae not icteric. Oral mucosa moist without lesions, posterior pharynx clear.  Neck supple. No JVD.  Lymphatics:no cervical,suraclavicular adenopathy Resp: clear to auscultation bilaterally and normal percussion bilaterally Cardio: regular rate and rhythm. No gallop. GI: abdomen obese, soft, nontender, not obviously distended, no appreciable mass or organomegaly. Normally active bowel sounds. Surgical incision not remarkable. Musculoskeletal/ Extremities: without pitting edema, cords, tenderness. Venous stasis  changes lower  legs bilaterally Neuro: no change peripheral neuropathy. Otherwise nonfocal Skin without rash, ecchymosis, petechiae Portacath-without erythema or tenderness  Lab Results:  Results for orders placed in visit on 10/16/13  CBC WITH DIFFERENTIAL      Result Value Ref Range   WBC 5.7  3.9 - 10.3 10e3/uL   NEUT# 3.7  1.5 - 6.5 10e3/uL   HGB 10.8 (*) 11.6 - 15.9 g/dL   HCT 32.8 (*) 34.8 - 46.6 %   Platelets 108 (*) 145 - 400 10e3/uL   MCV 109.6 (*) 79.5 - 101.0 fL   MCH 36.1 (*) 25.1 - 34.0 pg   MCHC 33.0  31.5 - 36.0 g/dL   RBC 2.99 (*) 3.70 - 5.45 10e6/uL   RDW 18.5 (*) 11.2 - 14.5 %   lymph# 1.4  0.9 - 3.3 10e3/uL   MONO# 0.5  0.1 - 0.9 10e3/uL   Eosinophils Absolute 0.0  0.0 - 0.5 10e3/uL   Basophils Absolute 0.1  0.0 - 0.1 10e3/uL   NEUT% 64.7  38.4 - 76.8 %   LYMPH% 25.1  14.0 - 49.7 %   MONO% 8.1  0.0 - 14.0 %   EOS% 0.9  0.0 - 7.0 %   BASO% 1.2  0.0 - 2.0 %  COMPREHENSIVE METABOLIC PANEL (RD40)      Result Value Ref Range   Sodium 142  136 - 145 mEq/L   Potassium 3.7  3.5 - 5.1 mEq/L   Chloride 103  98 - 109 mEq/L   CO2 28  22 - 29 mEq/L   Glucose 129  70 - 140 mg/dl   BUN 6.8 (*) 7.0 - 26.0 mg/dL   Creatinine 0.7  0.6 - 1.1 mg/dL   Total Bilirubin 0.34  0.20 - 1.20 mg/dL   Alkaline Phosphatase 89  40 - 150 U/L   AST 17  5 - 34 U/L   ALT 17  0 - 55 U/L   Total Protein 6.5  6.4 - 8.3 g/dL   Albumin 3.3 (*) 3.5 - 5.0 g/dL   Calcium 9.2  8.4 - 10.4 mg/dL   Anion Gap 10  3 - 11 mEq/L     Studies/Results:  No results found.  Medications: I have reviewed the patient's current medications. She will complete full 14 days of the flagyl.  DISCUSSION: will delay next chemo from July 3 for at least a week, in hopes that this will allow her to clear the C diff this time. We have discussed possible recurrent C diff in future, in which case she should change to po vancomycin or other.  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 at [facility] weekly 2.PICC in: flushed by mother, dressing changes at [facility] weekly 3.C diff diarrhea: first recurrence. Day 6/14 flagyl today  4.morbid obesity  5.long term anticoagulation since PAC-associated DVT in Dec 2014, now using single injection of arixtra daily  6. Multifactorial anemia: chemo, blood draws, chronic disease. 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 Continuing chemo as single agent carboplatin now.  9. 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 Need to clarify code status 12.T spine compression fractures Oct 2014 after fall, post kyphoplasties. Pain gradually improved      LIVESAY,LENNIS P, MD   10/16/2013, 3:49 PM

## 2013-10-19 ENCOUNTER — Telehealth: Payer: Self-pay | Admitting: Oncology

## 2013-10-19 ENCOUNTER — Ambulatory Visit: Payer: BC Managed Care – PPO | Admitting: Oncology

## 2013-10-19 ENCOUNTER — Other Ambulatory Visit: Payer: BC Managed Care – PPO

## 2013-10-19 NOTE — Telephone Encounter (Signed)
s/w pt sister re next appt for 7/2. pt to get new schedule when she comes in.

## 2013-10-21 ENCOUNTER — Other Ambulatory Visit: Payer: Self-pay | Admitting: Adult Health

## 2013-10-21 DIAGNOSIS — C541 Malignant neoplasm of endometrium: Secondary | ICD-10-CM

## 2013-10-21 DIAGNOSIS — C801 Malignant (primary) neoplasm, unspecified: Secondary | ICD-10-CM

## 2013-10-21 MED ORDER — OXYCODONE-ACETAMINOPHEN 10-325 MG PO TABS: 1.0000 | ORAL_TABLET | ORAL | Status: DC | PRN

## 2013-10-21 MED ORDER — NORMAL SALINE FLUSH 0.9 % IV SOLN: 10.0000 mL | INTRAVENOUS | Status: DC

## 2013-10-22 ENCOUNTER — Other Ambulatory Visit: Payer: BC Managed Care – PPO

## 2013-10-22 ENCOUNTER — Ambulatory Visit: Payer: BC Managed Care – PPO

## 2013-10-22 ENCOUNTER — Telehealth: Payer: Self-pay | Admitting: Oncology

## 2013-10-22 ENCOUNTER — Ambulatory Visit (HOSPITAL_BASED_OUTPATIENT_CLINIC_OR_DEPARTMENT_OTHER): Payer: BC Managed Care – PPO

## 2013-10-22 VITALS — BP 103/57 | HR 96

## 2013-10-22 DIAGNOSIS — Z452 Encounter for adjustment and management of vascular access device: Secondary | ICD-10-CM

## 2013-10-22 DIAGNOSIS — C549 Malignant neoplasm of corpus uteri, unspecified: Secondary | ICD-10-CM

## 2013-10-22 MED ORDER — SODIUM CHLORIDE 0.9 % IJ SOLN
10.0000 mL | INTRAMUSCULAR | Status: DC | PRN
  Administered 2013-10-22: 10 mL via INTRAVENOUS
  Filled 2013-10-22: qty 10

## 2013-10-22 MED ORDER — HEPARIN SOD (PORK) LOCK FLUSH 100 UNIT/ML IV SOLN
500.0000 [IU] | Freq: Once | INTRAVENOUS | Status: AC
  Administered 2013-10-22: 250 [IU] via INTRAVENOUS
  Filled 2013-10-22: qty 5

## 2013-10-22 NOTE — Telephone Encounter (Signed)
, °

## 2013-10-27 ENCOUNTER — Other Ambulatory Visit: Payer: Self-pay | Admitting: *Deleted

## 2013-10-27 DIAGNOSIS — G629 Polyneuropathy, unspecified: Secondary | ICD-10-CM

## 2013-10-27 MED ORDER — GABAPENTIN 100 MG PO CAPS: 200.0000 mg | ORAL_CAPSULE | Freq: Three times a day (TID) | ORAL | Status: DC

## 2013-10-28 ENCOUNTER — Other Ambulatory Visit (HOSPITAL_BASED_OUTPATIENT_CLINIC_OR_DEPARTMENT_OTHER): Payer: BC Managed Care – PPO

## 2013-10-28 ENCOUNTER — Ambulatory Visit (HOSPITAL_BASED_OUTPATIENT_CLINIC_OR_DEPARTMENT_OTHER): Payer: BC Managed Care – PPO

## 2013-10-28 ENCOUNTER — Other Ambulatory Visit: Payer: Self-pay | Admitting: Oncology

## 2013-10-28 VITALS — BP 114/73 | HR 89 | Temp 97.2°F

## 2013-10-28 DIAGNOSIS — C541 Malignant neoplasm of endometrium: Secondary | ICD-10-CM

## 2013-10-28 DIAGNOSIS — C549 Malignant neoplasm of corpus uteri, unspecified: Secondary | ICD-10-CM

## 2013-10-28 DIAGNOSIS — Z5111 Encounter for antineoplastic chemotherapy: Secondary | ICD-10-CM

## 2013-10-28 LAB — CBC WITH DIFFERENTIAL/PLATELET
BASO%: 0.1 % (ref 0.0–2.0)
Basophils Absolute: 0 10*3/uL (ref 0.0–0.1)
EOS%: 1.1 % (ref 0.0–7.0)
Eosinophils Absolute: 0.1 10*3/uL (ref 0.0–0.5)
HCT: 33.4 % — ABNORMAL LOW (ref 34.8–46.6)
HEMOGLOBIN: 10.9 g/dL — AB (ref 11.6–15.9)
LYMPH#: 1.4 10*3/uL (ref 0.9–3.3)
LYMPH%: 19.5 % (ref 14.0–49.7)
MCH: 35.7 pg — ABNORMAL HIGH (ref 25.1–34.0)
MCHC: 32.6 g/dL (ref 31.5–36.0)
MCV: 109.5 fL — ABNORMAL HIGH (ref 79.5–101.0)
MONO#: 0.6 10*3/uL (ref 0.1–0.9)
MONO%: 8.7 % (ref 0.0–14.0)
NEUT%: 70.6 % (ref 38.4–76.8)
NEUTROS ABS: 5 10*3/uL (ref 1.5–6.5)
Platelets: 116 10*3/uL — ABNORMAL LOW (ref 145–400)
RBC: 3.05 10*6/uL — ABNORMAL LOW (ref 3.70–5.45)
RDW: 16.3 % — AB (ref 11.2–14.5)
WBC: 7.1 10*3/uL (ref 3.9–10.3)
nRBC: 0 % (ref 0–0)

## 2013-10-28 LAB — COMPREHENSIVE METABOLIC PANEL (CC13)
ALBUMIN: 3 g/dL — AB (ref 3.5–5.0)
ALK PHOS: 80 U/L (ref 40–150)
ALT: 26 U/L (ref 0–55)
ANION GAP: 9 meq/L (ref 3–11)
AST: 26 U/L (ref 5–34)
BUN: 12 mg/dL (ref 7.0–26.0)
CO2: 30 meq/L — AB (ref 22–29)
Calcium: 9.4 mg/dL (ref 8.4–10.4)
Chloride: 105 mEq/L (ref 98–109)
Creatinine: 0.7 mg/dL (ref 0.6–1.1)
GLUCOSE: 129 mg/dL (ref 70–140)
POTASSIUM: 3.8 meq/L (ref 3.5–5.1)
SODIUM: 144 meq/L (ref 136–145)
TOTAL PROTEIN: 6.4 g/dL (ref 6.4–8.3)
Total Bilirubin: 0.36 mg/dL (ref 0.20–1.20)

## 2013-10-28 MED ORDER — DEXAMETHASONE SODIUM PHOSPHATE 20 MG/5ML IJ SOLN
INTRAMUSCULAR | Status: AC
  Filled 2013-10-28: qty 5

## 2013-10-28 MED ORDER — SODIUM CHLORIDE 0.9 % IV SOLN
Freq: Once | INTRAVENOUS | Status: AC
  Administered 2013-10-28: 15:00:00 via INTRAVENOUS

## 2013-10-28 MED ORDER — ONDANSETRON 16 MG/50ML IVPB (CHCC)
16.0000 mg | Freq: Once | INTRAVENOUS | Status: AC
  Administered 2013-10-28: 16 mg via INTRAVENOUS

## 2013-10-28 MED ORDER — SODIUM CHLORIDE 0.9 % IV SOLN
900.0000 mg | Freq: Once | INTRAVENOUS | Status: AC
  Administered 2013-10-28: 900 mg via INTRAVENOUS
  Filled 2013-10-28: qty 90

## 2013-10-28 MED ORDER — SODIUM CHLORIDE 0.9 % IV SOLN
150.0000 mg | Freq: Once | INTRAVENOUS | Status: AC
  Administered 2013-10-28: 150 mg via INTRAVENOUS
  Filled 2013-10-28: qty 5

## 2013-10-28 MED ORDER — HEPARIN SOD (PORK) LOCK FLUSH 100 UNIT/ML IV SOLN
500.0000 [IU] | Freq: Once | INTRAVENOUS | Status: AC | PRN
  Administered 2013-10-28: 500 [IU]
  Filled 2013-10-28: qty 5

## 2013-10-28 MED ORDER — DEXAMETHASONE SODIUM PHOSPHATE 20 MG/5ML IJ SOLN
12.0000 mg | Freq: Once | INTRAMUSCULAR | Status: AC
  Administered 2013-10-28: 12 mg via INTRAVENOUS

## 2013-10-28 MED ORDER — SODIUM CHLORIDE 0.9 % IJ SOLN
10.0000 mL | INTRAMUSCULAR | Status: DC | PRN
  Administered 2013-10-28: 10 mL
  Filled 2013-10-28: qty 10

## 2013-10-28 MED ORDER — CARBOPLATIN CHEMO INTRADERMAL TEST DOSE 100MCG/0.02ML
100.0000 ug | Freq: Once | INTRADERMAL | Status: AC
  Administered 2013-10-28: 100 ug via INTRADERMAL
  Filled 2013-10-28: qty 0.01

## 2013-10-28 MED ORDER — ONDANSETRON 16 MG/50ML IVPB (CHCC)
INTRAVENOUS | Status: AC
  Filled 2013-10-28: qty 16

## 2013-10-29 ENCOUNTER — Ambulatory Visit (HOSPITAL_BASED_OUTPATIENT_CLINIC_OR_DEPARTMENT_OTHER): Payer: BC Managed Care – PPO

## 2013-10-29 VITALS — BP 96/57 | HR 85 | Temp 98.0°F

## 2013-10-29 DIAGNOSIS — Z5189 Encounter for other specified aftercare: Secondary | ICD-10-CM

## 2013-10-29 DIAGNOSIS — C541 Malignant neoplasm of endometrium: Secondary | ICD-10-CM

## 2013-10-29 DIAGNOSIS — C549 Malignant neoplasm of corpus uteri, unspecified: Secondary | ICD-10-CM

## 2013-10-29 MED ORDER — PEGFILGRASTIM INJECTION 6 MG/0.6ML
6.0000 mg | Freq: Once | SUBCUTANEOUS | Status: AC
  Administered 2013-10-29: 6 mg via SUBCUTANEOUS
  Filled 2013-10-29: qty 0.6

## 2013-11-02 ENCOUNTER — Other Ambulatory Visit: Payer: Self-pay | Admitting: *Deleted

## 2013-11-02 DIAGNOSIS — C549 Malignant neoplasm of corpus uteri, unspecified: Secondary | ICD-10-CM

## 2013-11-02 MED ORDER — ONDANSETRON HCL 8 MG PO TABS: ORAL_TABLET | ORAL | Status: DC

## 2013-11-03 ENCOUNTER — Other Ambulatory Visit: Payer: Self-pay | Admitting: *Deleted

## 2013-11-03 DIAGNOSIS — C549 Malignant neoplasm of corpus uteri, unspecified: Secondary | ICD-10-CM

## 2013-11-03 MED ORDER — SUCRALFATE 1 GM/10ML PO SUSP: ORAL | Status: DC

## 2013-11-04 ENCOUNTER — Ambulatory Visit (HOSPITAL_BASED_OUTPATIENT_CLINIC_OR_DEPARTMENT_OTHER): Payer: BC Managed Care – PPO

## 2013-11-04 VITALS — BP 105/83 | HR 88 | Temp 97.0°F

## 2013-11-04 DIAGNOSIS — Z452 Encounter for adjustment and management of vascular access device: Secondary | ICD-10-CM

## 2013-11-04 DIAGNOSIS — C549 Malignant neoplasm of corpus uteri, unspecified: Secondary | ICD-10-CM

## 2013-11-04 DIAGNOSIS — C50919 Malignant neoplasm of unspecified site of unspecified female breast: Secondary | ICD-10-CM

## 2013-11-04 DIAGNOSIS — C779 Secondary and unspecified malignant neoplasm of lymph node, unspecified: Secondary | ICD-10-CM

## 2013-11-04 MED ORDER — SODIUM CHLORIDE 0.9 % IJ SOLN
10.0000 mL | INTRAMUSCULAR | Status: DC | PRN
  Administered 2013-11-04: 10 mL via INTRAVENOUS
  Filled 2013-11-04: qty 10

## 2013-11-04 MED ORDER — HEPARIN SOD (PORK) LOCK FLUSH 100 UNIT/ML IV SOLN
500.0000 [IU] | Freq: Once | INTRAVENOUS | Status: AC
  Administered 2013-11-04: 250 [IU] via INTRAVENOUS
  Filled 2013-11-04: qty 5

## 2013-11-04 NOTE — Patient Instructions (Signed)
PICC Home Guide A peripherally inserted central catheter (PICC) is a long, thin, flexible tube that is inserted into a vein in the upper arm. It is a form of intravenous (IV) access. It is considered to be a "central" line because the tip of the PICC ends in a large vein in your chest. This large vein is called the superior vena cava (SVC). The PICC tip ends in the SVC because there is a lot of blood flow in the SVC. This allows medicines and IV fluids to be quickly distributed throughout the body. The PICC is inserted using a sterile technique by a specially trained nurse or physician. After the PICC is inserted, a chest X-ray exam is done to be sure it is in the correct place.  A PICC may be placed for different reasons, such as:  To give medicines and liquid nutrition that can only be given through a central line. Examples are:  Certain antibiotic treatments.  Chemotherapy.  Total parenteral nutrition (TPN).  To take frequent blood samples.  To give IV fluids and blood products.  If there is difficulty placing a peripheral intravenous (PIV) catheter. If taken care of properly, a PICC can remain in place for several months. A PICC can also allow a person to go home from the hospital early. Medicine and PICC care can be managed at home by a family member or home health care team. WHAT PROBLEMS CAN HAPPEN WHEN I HAVE A PICC? Problems with a PICC can occasionally occur. These may include:  A blood clot (thrombus) forming in or at the tip of the PICC. This can cause the PICC to become clogged. A clot-dissolving medicine called tissue plasminogen activator (tPA) can be given through the PICC to help break up the clot.  Inflammation of the vein (phlebitis) in which the PICC is placed. Signs of inflammation may include redness, pain at the insertion site, red streaks, or being able to feel a "cord" in the vein where the PICC is located.  Infection in the PICC or at the insertion site. Signs of  infection may include fever, chills, redness, swelling, or pus drainage from the PICC insertion site.  PICC movement (malposition). The PICC tip may move from its original position due to excessive physical activity, forceful coughing, sneezing, or vomiting.  A break or cut in the PICC. It is important to not use scissors near the PICC.  Nerve or tendon irritation or injury during PICC insertion. WHAT SHOULD I KEEP IN MIND ABOUT ACTIVITIES WHEN I HAVE A PICC?  You may bend your arm and move it freely. If your PICC is near or at the bend of your elbow, avoid activity with repeated motion at the elbow.  Rest at home for the remainder of the day following PICC line insertion.  Avoid lifting heavy objects as instructed by your health care provider.  Avoid using a crutch with the arm on the same side as your PICC. You may need to use a walker. WHAT SHOULD I KNOW ABOUT MY PICC DRESSING?  Keep your PICC bandage (dressing) clean and dry to prevent infection.  Ask your health care provider when you may shower. Ask your health care provider to teach you how to wrap the PICC when you do take a shower.  Change the PICC dressing as instructed by your health care provider.  Change your PICC dressing if it becomes loose or wet. WHAT SHOULD I KNOW ABOUT PICC CARE?  Check the PICC insertion site daily for   leakage, redness, swelling, or pain.  Do not take a bath, swim, or use hot tubs when you have a PICC. Cover PICC line with clear plastic wrap and tape to keep it dry while showering.  Flush the PICC as directed by your health care provider. Let your health care provider know right away if the PICC is difficult to flush or does not flush. Do not use force to flush the PICC.  Do not use a syringe that is less than 10 mL to flush the PICC.  Never pull or tug on the PICC.  Avoid blood pressure checks on the arm with the PICC.  Keep your PICC identification card with you at all times.  Do not  take the PICC out yourself. Only a trained clinical professional should remove the PICC. SEEK IMMEDIATE MEDICAL CARE IF:  Your PICC is accidently pulled all the way out. If this happens, cover the insertion site with a bandage or gauze dressing. Do not throw the PICC away. Your health care provider will need to inspect it.  Your PICC was tugged or pulled and has partially come out. Do not  push the PICC back in.  There is any type of drainage, redness, or swelling where the PICC enters the skin.  You cannot flush the PICC, it is difficult to flush, or the PICC leaks around the insertion site when it is flushed.  You hear a "flushing" sound when the PICC is flushed.  You have pain, discomfort, or numbness in your arm, shoulder, or jaw on the same side as the PICC.  You feel your heart "racing" or skipping beats.  You notice a hole or tear in the PICC.  You develop chills or a fever. MAKE SURE YOU:   Understand these instructions.  Will watch your condition.  Will get help right away if you are not doing well or get worse. Document Released: 10/14/2002 Document Revised: 01/28/2013 Document Reviewed: 12/15/2012 ExitCare Patient Information 2015 [employer]. This information is not intended to replace advice given to you by your health care provider. Make sure you discuss any questions you have with your health care provider.  

## 2013-11-08 NOTE — Progress Notes (Signed)
[name]  [phone] [phone]  OFFICE PROGRESS NOTE    PATIENT: [name]   DOB: Sep 18, 1965  MR#: [id number]  DGL#:[id number]   JJ:OACZYS, Catherine Main, MD GYN ONC: [name], M.D. SU:  [name], M.D.    DIAGNOSIS: [name] is a 48 y.o. female with a prior history of stage IA uterine cancer diagnosed in 2010.  Now with recurrent disease diagnosed in 08/2012.  PRIOR THERAPY: #1  Patient developed excessive uterine bleeding.  She underwent dilatation and curettage with hysteroscopy. 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 Due to morbid obesity Mirena IUD was placed along with Aygestin. Patient had a dramatic directed weight loss with nutritional support she went from 523 pounds to 359 pounds in 10/2009.  On 12/27/2009 she underwent a robotic-assisted laparoscopic hysterectomy, bilateral salpingo-oophorectomy, and mini laparotomy through the umbilical port with morcellation of uterus within about the delivery of the uterus. The final pathology revealed an endometrial adenocarcinoma grade 2 with invasion limited to 1 mm of the myometrium.  #2  Patient continued to do well until 09/2012 when she developed right upper quadrant pain. It was presumed to be cholelithiasis. 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 Omental biopsies were obtained. The pathology was consistent with metastatic adenocarcinoma with the presumption of recurrent endometrial carcinoma.  She was seen by Dr. Janie Marquez on 10/07/2012.  Dr. Skeet Marquez recommended that [name] undergo chemotherapy consisting of Taxol and Carboplatinum.  Chemotherapy started on 11/04/2012.   #3 Abdominal ascites with multiple paracenteses.  #4 Back pain and subsequent MRI on 02/05/2013 revealed compression fractures of T10 and T11.    #5 Status post kyphoplasty with Dr. Luanne Marquez on 02/26/2013.  Patient states her back pain continues.  #6 Patient hospitalized and underwent more MRI's on 12/4 and [name] and [name] compression fractures were noted, after consultation with Dr. Sherwood Marquez, the patient was referred back to interventional radiology for evaluation for kyphoplasty and this was performed on 04/24/13.    #7 Patient diagnosed with right upper extremity DVT on 04/13/13.  She was initially admitted to the hospital and started on a heparin gtt to remove her port.  She was then transitioned to Enoxaparin BID.     CURRENT THERAPY:   Taxol/carboplatin  INTERVAL HISTORY: Patient is seen in followup today. Clinically she seems to be doing well and is without any significant problems. She has noticed that she is losing her hair now. She denies any pain. Her pain is really well controlled. Her right upper extremity looks much better. She denies any fevers chills night sweats headaches she has no shortness of breath chest pains or palpitations. She has not had any recurrence of her rashes. Dry skin improved.  She has no bleeding problems. Her bowels and bladder is working well. Remainder of the 10 point systems is negative.  PAST MEDICAL HISTORY: Past Medical History  Diagnosis Date  . Obesity   . DVT (deep venous thrombosis) 2009    RLE, tx for ~6 months  . PONV (postoperative nausea and vomiting)   . Swelling     BOTH LEGS  . Rash     LOWER ABD  . Gallstones   . Ascites   . Uterine cancer     [name], chemotherapy  . Family history of anesthesia complication     MOTHER IS DIFFICULT TO WAKE  .  Gallstones   . Cellulitis of leg, right     PAST SURGICAL HISTORY: Past Surgical History  Procedure Laterality Date  . Ankle surgery  1990  . Wisdom tooth extraction    . Tonsillectomy and adenoidectomy    . Abdominal hysterectomy  12/2009    [facility], B[facility]  . Laparoscopy N/A 10/02/2012    Procedure: LAPAROSCOPY DIAGNOSTIC,  perocentisis, omental biopsy;  Surgeon: [name]. Tsuei, MD;  Location: [facility];  Service: General;  Laterality: N/A;  removed a total of 15,000 of acities  . Paracentesis    . Kyphoplasty  02/26/2013    FAMILY HISTORY: Family History  Problem Relation Age of Onset  . Heart disease Father   . Diabetes Brother   . Heart disease Mother   . Cirrhosis Father   . Diabetes Father   . Thyroid disease Mother   . Diabetes Mother   . Kidney disease Brother     from diabetes  . Breast cancer Maternal Aunt   . Breast cancer Maternal Aunt   . Melanoma Maternal Aunt   . Melanoma Maternal Aunt   . Other Maternal Aunt     lupus anticardiolipin ab syndrome    SOCIAL HISTORY: History  Substance Use Topics  . Smoking status: Never Smoker   . Smokeless tobacco: Never Used  . Alcohol Use: Yes     Comment: social, Occassionally    ALLERGIES: No Known Allergies   MEDICATIONS:  Current Outpatient Prescriptions  Medication Sig Dispense Refill  . Ascorbic Acid (VITAMIN C) 1000 MG tablet Take 1,000 mg by mouth daily.      Marland Kitchen b complex vitamins capsule Take 1 capsule by mouth daily.      . Cyanocobalamin (VITAMIN B-12 PO) Take 2,500 mg by mouth daily.       . ferrous sulfate 325 (65 FE) MG tablet TAKE 1 TABLET BY MOUTH 3 TIMES A DAY WITH MEALS  90 tablet  2  . folic acid (FOLVITE) 628 MCG tablet Take 400 mcg by mouth daily.        . furosemide (LASIX) 20 MG tablet Take 1 tablet (20 mg total) by mouth every other day.  30 tablet  0  . loratadine (CLARITIN) 10 MG tablet Take 10 mg by mouth daily.      . magnesium gluconate (MAGONATE) 500 MG tablet Take 500 mg by mouth daily.      . miconazole nitrate (MICATIN) POWD Apply 1 application topically 4 (four) times daily.  10 g  2  . Multiple Vitamins-Minerals (MULTIVITAMIN WITH MINERALS) tablet Take 1 tablet by mouth daily.       Marland Kitchen nystatin ointment (MYCOSTATIN) Apply 1 application topically 2 (two) times daily.      . ondansetron (ZOFRAN ODT) 8 MG  disintegrating tablet Take 1 tablet (8 mg total) by mouth every 8 (eight) hours as needed for nausea or vomiting.  20 tablet  0  . pantoprazole (PROTONIX) 40 MG tablet TAKE 1 TABLET (40 MG TOTAL) BY MOUTH DAILY.  30 tablet  6  . potassium chloride (K-DUR) 10 MEQ tablet Take 2 tablets (20 mEq total) by mouth daily.  14 tablet  0  . calcium carbonate (OS-CAL) 600 MG TABS tablet Take 600 mg by mouth daily.      . Cholecalciferol (VITAMIN D3) 2000 UNITS TABS Take 1 tablet by mouth daily.      . cyclobenzaprine (FLEXERIL) 5 MG tablet TAKE 1 TABLET BY MOUTH 3 TIMES A DAY AS NEEDED FOR MUSCLE  SPASMS  60 tablet  0  . fondaparinux (ARIXTRA) 10 MG/0.8ML SOLN injection Inject 0.8 mLs (10 mg total) into the skin daily.  30 Syringe  2  . gabapentin (NEURONTIN) 100 MG capsule Take 2 capsules (200 mg total) by mouth 3 (three) times daily.  180 capsule  0  . Heparin Lock Flush (HEPARIN FLUSH, PORCINE,) 100 UNIT/ML injection FLUSH EACH LUMEN WITH 250 UNITS (2.5ML) DAILY. USE NEW SYRINGE FOR EACH LUMEN.  300 mL  0  . metroNIDAZOLE (FLAGYL) 500 MG tablet Take 1 tablet (500 mg total) by mouth 3 (three) times daily.  42 tablet  0  . ondansetron (ZOFRAN) 8 MG tablet TAKE 1 TABLET BY MOUTH TWICE A DAY AS NEEDED START DAY AFTER CHEMO  20 tablet  0  . oxyCODONE-acetaminophen (PERCOCET) 10-325 MG per tablet Take 1 tablet by mouth every 4 (four) hours as needed for pain.  90 tablet  0  . prochlorperazine (COMPAZINE) 10 MG tablet TAKE 1 TABLET BY MOUTH EVERY 6 HOURS AS NEEDED FOR NAUSEA AND VOMITING  30 tablet  1  . promethazine (PHENERGAN) 25 MG tablet TAKE 1/2 TABLET BY MOUTH EVERY 6 HOURS AS NEEDED FOR NAUSEA  15 tablet  0  . Sodium Chloride Flush (NORMAL SALINE FLUSH) 0.9 % SOLN Inject 10 mLs into the vein 2 (two) times a week. PICC flush  50 Syringe  0  . sucralfate (CARAFATE) 1 GM/10ML suspension TAKE 10 MLS BY MOUTH 4 TIMES A DAY  420 mL  2   No current facility-administered medications for this visit.      REVIEW  OF SYSTEMS: A 10 point review of systems was completed and is negative except as noted above.   PHYSICAL EXAMINATION: BP 102/70  Pulse 96  Temp(Src) 98.1 F (36.7 C) (Oral)  Resp 18  Ht 5\' 11"  (1.803 m)  Wt 399 lb 1.6 oz (181.031 kg)  BMI 55.69 kg/m2  General: Patient is a well appearing female in no acute distress HEENT: PERRLA, sclerae anicteric no conjunctival pallor, MMM Neck: supple, no palpable adenopathy Lungs: clear to auscultation bilaterally, no wheezes, rhonchi, or rales Cardiovascular: regular rate rhythm, S1, S2, no murmurs, rubs or gallops Abdomen: Soft, non-tender, non-distended, normoactive bowel sounds, no HSM, limited exam patient morbidly obese, erythema to mid abdomen, no tenderness or warmrth Extremities: warm and well perfused, no clubbing, cyanosis, or edema, strength 5/5 all extremities Skin: no rashes or lesions, right breast with erythema and swelling, left hand with plaque like erythema on fingertips and fingers up to knuckles of all 5 digits.  No warmth, tenderness Neuro: Non-focal ECOG FS:  3 - Symptomatic, >50% confined to bed       No results found for this basename: [id number]    No components found with this basename: [id number]     RADIOGRAPHIC STUDIES: Ir Vertebroplasty Or Sacroplasty 03/02/2013   CLINICAL DATA:  Patient with severely painful compression fracture at T10 and T11. History of endometrial carcinoma. On chemotherapy.  EXAM: VERTEBROPLASTY FL AT T10 and T11.  CORE BIOPSIES OBTAINED  MEDICATIONS: Versed 4 mg. mg IV, Fentanyl 100  mcg IV.  ANESTHESIA/SEDATION: Total Moderate Sedation Time:  35 min.  FLUOROSCOPY TIME:  8 min 48 seconds. Marland Kitchen  PROCEDURE: Following a full explanation of the procedure along with the potentially associated complications, a witnessed informed consent was obtained.  The patient was placed prone on the fluoroscopic table. Nasal oxygen was administered. Physiologic monitoring was performed throughout the duration of the  procedure. The skin  overlying the thoracic region was prepped and draped in the usual sterile fashion. The T10 and T11 vertebral bodies were identified and the right pedicle at T10, and the left pedicle at T11 were infiltrated with 0.25% Bupivacaine. This was then followed by the advancement of a 13-gauge Cook needle through both the pedicles into the anterior one-third at both levels. Prior to this, 18 gauge core biopsy needles were used to obtain 4 samples of both the levels using a 20 mL syringe. These were sent for pathologic analysis. A gentle contrast injection demonstrated a trabecular pattern of contrast.  At this time, methylmethacrylate mixture was reconstituted. Under biplane intermittent fluoroscopy, the methylmethacrylate was then injected into the T10 and T11 vertebral body with filling of the vertebral body at T10 and T12.  Mild extravasation was noted into the disk spaces through the inferior endplate fracture clefts . No epidural venous contamination was seen.  The needles were then removed. Hemostasis was achieved at the skin entry site.  There were no acute complications. Patient tolerated the procedure well. The patient was observed for 3 hours and discharged in good condition.  IMPRESSION: Status post vertebral body augmentation for painful compression fracture at T10 and T11 using vertebroplasty technique.  Core biopsies obtained at T10 and T11. Sample sent for pathologic analysis.   Electronically Signed   By: [name] M.D.   On: 02/26/2013 13:10      ASSESSMENT/PLAN: CASSY SPROWL is a 48 y.o. woman:  #1 Recurrent uterine cancer with peritoneal carcinomatosis and ascites:.s/p taxol/carboplatin. Risks benefits and side effects of treatment were discussed. She understands that she may continue this for a few more cycles. She understands that she may get another test dose for the carboplatinum. She knows that she is at risk for developing allergic reactions. She consents to  proceeding with her therapy.  #2 Ascites: Patient has had multiple paracenteses performed. However with chemotherapy we have not had to do a paracentesis for quite some time. At this time we will continue to follow.  #3 Back pain - Compression fractures at T10 and T11 per MRI on 02/05/2013.  Status post kyphoplasty on 02/26/2013.  Patient with repeat MRI on 12/4 demonstrating compression fractures at [name] and [name].  Patient underwent [name] and [name] kyphoplasty on 04/24/12.  Her pain is much improved.   #4  Right upper extremity DVT: Diagnosed 04/13/13.  She was started on a Heparin gtt and admitted to the hospital where she underwent right port removal and transitioned to Lovenox BID.  She is tolerating the Lovenox well.  For now we will continue Lovenox.  #5 right breast swelling: improved significantly  #6 rash on left hand: resolved  #7 PICC line: Patient has a PICC line in her left upper extremity. Looks clean, no signs of infections  #8 followup: Patient will be seen back in one week for followup and blood work as well as chemotherapy toxicity assessment.    Marcy Panning, MD Medical/Oncology [facility] [phone] (beeper) [phone] (Office)

## 2013-11-10 ENCOUNTER — Other Ambulatory Visit: Payer: Self-pay | Admitting: Oncology

## 2013-11-10 DIAGNOSIS — C541 Malignant neoplasm of endometrium: Secondary | ICD-10-CM

## 2013-11-11 ENCOUNTER — Other Ambulatory Visit: Payer: Self-pay | Admitting: *Deleted

## 2013-11-11 ENCOUNTER — Other Ambulatory Visit (HOSPITAL_BASED_OUTPATIENT_CLINIC_OR_DEPARTMENT_OTHER): Payer: BC Managed Care – PPO

## 2013-11-11 ENCOUNTER — Ambulatory Visit (HOSPITAL_BASED_OUTPATIENT_CLINIC_OR_DEPARTMENT_OTHER): Payer: BC Managed Care – PPO | Admitting: Oncology

## 2013-11-11 ENCOUNTER — Ambulatory Visit (HOSPITAL_BASED_OUTPATIENT_CLINIC_OR_DEPARTMENT_OTHER): Payer: BC Managed Care – PPO

## 2013-11-11 ENCOUNTER — Encounter: Payer: Self-pay | Admitting: Oncology

## 2013-11-11 VITALS — BP 107/72 | HR 98 | Temp 97.9°F | Resp 20 | Ht 71.0 in | Wt >= 6400 oz

## 2013-11-11 DIAGNOSIS — C541 Malignant neoplasm of endometrium: Secondary | ICD-10-CM

## 2013-11-11 DIAGNOSIS — Z452 Encounter for adjustment and management of vascular access device: Secondary | ICD-10-CM

## 2013-11-11 DIAGNOSIS — I82409 Acute embolism and thrombosis of unspecified deep veins of unspecified lower extremity: Secondary | ICD-10-CM

## 2013-11-11 DIAGNOSIS — C549 Malignant neoplasm of corpus uteri, unspecified: Secondary | ICD-10-CM

## 2013-11-11 DIAGNOSIS — R1011 Right upper quadrant pain: Secondary | ICD-10-CM

## 2013-11-11 DIAGNOSIS — T451X5A Adverse effect of antineoplastic and immunosuppressive drugs, initial encounter: Secondary | ICD-10-CM

## 2013-11-11 DIAGNOSIS — D6481 Anemia due to antineoplastic chemotherapy: Secondary | ICD-10-CM

## 2013-11-11 DIAGNOSIS — D638 Anemia in other chronic diseases classified elsewhere: Secondary | ICD-10-CM

## 2013-11-11 DIAGNOSIS — G620 Drug-induced polyneuropathy: Secondary | ICD-10-CM

## 2013-11-11 LAB — CBC WITH DIFFERENTIAL/PLATELET
BASO%: 0.8 % (ref 0.0–2.0)
Basophils Absolute: 0.1 10*3/uL (ref 0.0–0.1)
EOS ABS: 0.1 10*3/uL (ref 0.0–0.5)
EOS%: 0.9 % (ref 0.0–7.0)
HCT: 31.5 % — ABNORMAL LOW (ref 34.8–46.6)
HGB: 10.5 g/dL — ABNORMAL LOW (ref 11.6–15.9)
LYMPH%: 21.9 % (ref 14.0–49.7)
MCH: 36.4 pg — ABNORMAL HIGH (ref 25.1–34.0)
MCHC: 33.5 g/dL (ref 31.5–36.0)
MCV: 108.9 fL — ABNORMAL HIGH (ref 79.5–101.0)
MONO#: 0.4 10*3/uL (ref 0.1–0.9)
MONO%: 5.9 % (ref 0.0–14.0)
NEUT%: 70.5 % (ref 38.4–76.8)
NEUTROS ABS: 4.6 10*3/uL (ref 1.5–6.5)
Platelets: 125 10*3/uL — ABNORMAL LOW (ref 145–400)
RBC: 2.89 10*6/uL — AB (ref 3.70–5.45)
RDW: 17.3 % — AB (ref 11.2–14.5)
WBC: 6.5 10*3/uL (ref 3.9–10.3)
lymph#: 1.4 10*3/uL (ref 0.9–3.3)

## 2013-11-11 LAB — COMPREHENSIVE METABOLIC PANEL (CC13)
ALBUMIN: 3 g/dL — AB (ref 3.5–5.0)
ALK PHOS: 86 U/L (ref 40–150)
ALT: 25 U/L (ref 0–55)
ANION GAP: 11 meq/L (ref 3–11)
AST: 16 U/L (ref 5–34)
BUN: 11.1 mg/dL (ref 7.0–26.0)
CO2: 29 meq/L (ref 22–29)
Calcium: 9.2 mg/dL (ref 8.4–10.4)
Chloride: 101 mEq/L (ref 98–109)
Creatinine: 0.7 mg/dL (ref 0.6–1.1)
Glucose: 138 mg/dl (ref 70–140)
POTASSIUM: 3.7 meq/L (ref 3.5–5.1)
SODIUM: 141 meq/L (ref 136–145)
TOTAL PROTEIN: 6.6 g/dL (ref 6.4–8.3)
Total Bilirubin: 0.39 mg/dL (ref 0.20–1.20)

## 2013-11-11 MED ORDER — SODIUM CHLORIDE 0.9 % IJ SOLN
10.0000 mL | INTRAMUSCULAR | Status: DC | PRN
  Administered 2013-11-11: 10 mL via INTRAVENOUS
  Filled 2013-11-11: qty 10

## 2013-11-11 MED ORDER — HEPARIN SOD (PORK) LOCK FLUSH 100 UNIT/ML IV SOLN
500.0000 [IU] | Freq: Once | INTRAVENOUS | Status: AC
  Administered 2013-11-11: 500 [IU] via INTRAVENOUS
  Filled 2013-11-11: qty 5

## 2013-11-11 MED ORDER — PROCHLORPERAZINE MALEATE 10 MG PO TABS: ORAL_TABLET | ORAL | Status: DC

## 2013-11-11 MED ORDER — ONDANSETRON HCL 8 MG PO TABS: ORAL_TABLET | ORAL | Status: DC

## 2013-11-11 MED ORDER — PROMETHAZINE HCL 25 MG PO TABS: ORAL_TABLET | ORAL | Status: DC

## 2013-11-11 NOTE — Patient Instructions (Signed)
PICC Home Guide A peripherally inserted central catheter (PICC) is a long, thin, flexible tube that is inserted into a vein in the upper arm. It is a form of intravenous (IV) access. It is considered to be a "central" line because the tip of the PICC ends in a large vein in your chest. This large vein is called the superior vena cava (SVC). The PICC tip ends in the SVC because there is a lot of blood flow in the SVC. This allows medicines and IV fluids to be quickly distributed throughout the body. The PICC is inserted using a sterile technique by a specially trained nurse or physician. After the PICC is inserted, a chest X-ray exam is done to be sure it is in the correct place.  A PICC may be placed for different reasons, such as:  To give medicines and liquid nutrition that can only be given through a central line. Examples are:  Certain antibiotic treatments.  Chemotherapy.  Total parenteral nutrition (TPN).  To take frequent blood samples.  To give IV fluids and blood products.  If there is difficulty placing a peripheral intravenous (PIV) catheter. If taken care of properly, a PICC can remain in place for several months. A PICC can also allow a person to go home from the hospital early. Medicine and PICC care can be managed at home by a family member or home health care team. WHAT PROBLEMS CAN HAPPEN WHEN I HAVE A PICC? Problems with a PICC can occasionally occur. These may include:  A blood clot (thrombus) forming in or at the tip of the PICC. This can cause the PICC to become clogged. A clot-dissolving medicine called tissue plasminogen activator (tPA) can be given through the PICC to help break up the clot.  Inflammation of the vein (phlebitis) in which the PICC is placed. Signs of inflammation may include redness, pain at the insertion site, red streaks, or being able to feel a "cord" in the vein where the PICC is located.  Infection in the PICC or at the insertion site. Signs of  infection may include fever, chills, redness, swelling, or pus drainage from the PICC insertion site.  PICC movement (malposition). The PICC tip may move from its original position due to excessive physical activity, forceful coughing, sneezing, or vomiting.  A break or cut in the PICC. It is important to not use scissors near the PICC.  Nerve or tendon irritation or injury during PICC insertion. WHAT SHOULD I KEEP IN MIND ABOUT ACTIVITIES WHEN I HAVE A PICC?  You may bend your arm and move it freely. If your PICC is near or at the bend of your elbow, avoid activity with repeated motion at the elbow.  Rest at home for the remainder of the day following PICC line insertion.  Avoid lifting heavy objects as instructed by your health care provider.  Avoid using a crutch with the arm on the same side as your PICC. You may need to use a walker. WHAT SHOULD I KNOW ABOUT MY PICC DRESSING?  Keep your PICC bandage (dressing) clean and dry to prevent infection.  Ask your health care provider when you may shower. Ask your health care provider to teach you how to wrap the PICC when you do take a shower.  Change the PICC dressing as instructed by your health care provider.  Change your PICC dressing if it becomes loose or wet. WHAT SHOULD I KNOW ABOUT PICC CARE?  Check the PICC insertion site daily for   leakage, redness, swelling, or pain.  Do not take a bath, swim, or use hot tubs when you have a PICC. Cover PICC line with clear plastic wrap and tape to keep it dry while showering.  Flush the PICC as directed by your health care provider. Let your health care provider know right away if the PICC is difficult to flush or does not flush. Do not use force to flush the PICC.  Do not use a syringe that is less than 10 mL to flush the PICC.  Never pull or tug on the PICC.  Avoid blood pressure checks on the arm with the PICC.  Keep your PICC identification card with you at all times.  Do not  take the PICC out yourself. Only a trained clinical professional should remove the PICC. SEEK IMMEDIATE MEDICAL CARE IF:  Your PICC is accidently pulled all the way out. If this happens, cover the insertion site with a bandage or gauze dressing. Do not throw the PICC away. Your health care provider will need to inspect it.  Your PICC was tugged or pulled and has partially come out. Do not  push the PICC back in.  There is any type of drainage, redness, or swelling where the PICC enters the skin.  You cannot flush the PICC, it is difficult to flush, or the PICC leaks around the insertion site when it is flushed.  You hear a "flushing" sound when the PICC is flushed.  You have pain, discomfort, or numbness in your arm, shoulder, or jaw on the same side as the PICC.  You feel your heart "racing" or skipping beats.  You notice a hole or tear in the PICC.  You develop chills or a fever. MAKE SURE YOU:   Understand these instructions.  Will watch your condition.  Will get help right away if you are not doing well or get worse. Document Released: 10/14/2002 Document Revised: 01/28/2013 Document Reviewed: 12/15/2012 ExitCare Patient Information 2015 [employer]. This information is not intended to replace advice given to you by your health care provider. Make sure you discuss any questions you have with your health care provider.  

## 2013-11-11 NOTE — Progress Notes (Signed)
OFFICE PROGRESS NOTE   11/11/2013   Physicians:K.Khan, W.Brewster, Orpah Melter, M.Tsuei   INTERVAL HISTORY:  Patient is seen, together with mother and aunt, in continuing attention to endometrial carcinoma for which she continues every 3 week carboplatin, due next on 11-18-13. She was treated with second course of Flagyl from 10-09-13 thru 10-30-13, with complete resolution of diarrhea and improvement in nausea. Bowels have been moving normally since sometime during the second Flagyl course. She tolerated carboplatin on 10-28-13 much better than previously, with some nausea but no vomiting, able to eat potatoes and some other foods in addition to just chicken soup during week after treatment. She has felt the best for past week or longer than she has in months, able to go with family to favorite restaurant for 2 hours, able to grocery shop x 2, able to sit in living room.  She continues arixtra injections once daily, no bleeding.  Mother continues to flush PICC, with dressing changes once weekly at this office, done today.   ONCOLOGIC HISTORY 1 Patient developed excessive uterine bleeding. Dilatation and curettage with hysteroscopy documented grade 1 endometrioid endometrial adenocarcinoma. Due to morbid obesity Mirena IUD was placed along with Aygestin. Patient had a dramatic directed weight loss with nutritional support she went from 523 pounds to 359 pounds in 10/2009. On 12/27/2009 she underwent a robotic-assisted laparoscopic hysterectomy, bilateral salpingo-oophorectomy, and mini laparotomy through the umbilical port with morcellation of uterus within about the delivery of the uterus. The final pathology revealed an endometrial adenocarcinoma grade 2 with invasion limited to 1 mm of the myometrium.  #2 Patient did well until 09/2012 when she developed right upper quadrant pain, presumed cholelithiasis. 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 Omental biopsies had metastatic adenocarcinoma consistent with recurrent endometrial carcinoma. She had 12 cycles taxol carboplatin from 11-04-12 thru 07-24-13 with neulasta day 2. CT AP after cycle 12 showed stable to minimally improved disease. With progressive neuropathy, plan is to continue single agent carboplatin for now.  #3 Abdominal ascites with multiple paracenteses.  #4 Back pain and subsequent MRI on 02/05/2013 revealed compression fractures of T10 and T11. Kyphoplasty 02/26/2013.  #6 Patient hospitalized and underwent more MRI's on 12/4 and L1 and L2 compression fractures were noted, after consultation with Dr. Sherwood Gambler, the patient was referred back to interventional radiology for evaluation for kyphoplasty and this was performed on 04/24/13.  #6. Right upper extremity DVT 04/13/13 related to [facility], which was removed. Now on once daily Arixtra  Review of systems as above, also: No fever. No problems with PICC. No increased SOB. No new or different pain. No LE swelling. Using only 1-2 Percocet daily, generally for back pain. Remainder of 10 point Review of Systems negative.  Objective:  Vital signs in last 24 hours:  BP 107/72  Pulse 98  Temp(Src) 97.9 F (36.6 C) (Oral)  Resp 20  Ht 5\' 11"  (1.803 m)  Wt 405 lb 6.4 oz (183.888 kg)  BMI 56.57 kg/m2 weight is up 5 lbs  Alert, oriented and appropriate. In WC, respirations not labored RA.  Partial alopecia  HEENT:PERRL, sclerae not icteric. Oral mucosa moist without lesions. Neck supple. No JVD.  Lymphatics:no cervical,suraclavicular adenopathy Resp: clear to auscultation bilaterally and normal percussion bilaterally Cardio: regular rate and rhythm. No gallop. GI: abdomen obese soft, nontender, not more distended,cannot appreciate mass or organomegaly. Some bowel sounds.  Musculoskeletal/ Extremities: Venous stasis changes lower legs. Extremities without pitting edema, cords,  tenderness Neuro: moves all  extremities in WC. Speech fluent and appropriate. PSYCH: mood and affect brighter today Skin without rash, ecchymosis, petechiae PICC left upper arm site unremarkable  Lab Results:  Results for orders placed in visit on 11/11/13  CBC WITH DIFFERENTIAL      Result Value Ref Range   WBC 6.5  3.9 - 10.3 10e3/uL   NEUT# 4.6  1.5 - 6.5 10e3/uL   HGB 10.5 (*) 11.6 - 15.9 g/dL   HCT 31.5 (*) 34.8 - 46.6 %   Platelets 125 (*) 145 - 400 10e3/uL   MCV 108.9 (*) 79.5 - 101.0 fL   MCH 36.4 (*) 25.1 - 34.0 pg   MCHC 33.5  31.5 - 36.0 g/dL   RBC 2.89 (*) 3.70 - 5.45 10e6/uL   RDW 17.3 (*) 11.2 - 14.5 %   lymph# 1.4  0.9 - 3.3 10e3/uL   MONO# 0.4  0.1 - 0.9 10e3/uL   Eosinophils Absolute 0.1  0.0 - 0.5 10e3/uL   Basophils Absolute 0.1  0.0 - 0.1 10e3/uL   NEUT% 70.5  38.4 - 76.8 %   LYMPH% 21.9  14.0 - 49.7 %   MONO% 5.9  0.0 - 14.0 %   EOS% 0.9  0.0 - 7.0 %   BASO% 0.8  0.0 - 2.0 %    CMET available after visit normal with exception of albumin 3.0  Studies/Results:  No results found.  Medications: I have reviewed the patient's current medications. Refills sent for zofran, compazine, phenergan. Second course Flagyl completed July 10.  DISCUSSION: Patient and family are pleased with recent improvement and glad to continue carboplatin as planned.  Assessment/Plan:  1.metastatic endometrial carcinoma with mostly pelvic involvement by last imaging, stable to slightly improved: Tolerating single agent carboplatin better with adjustments in antiemetics. Taxol held due to peripheral neuropathy. 2.PICC in: flushed by mother, dressing changes at [facility] weekly  3.C diff diarrhea: clinically resolved after second course oral Flagyl.  4.morbid obesity  5.long term anticoagulation since PAC-associated DVT in Dec 2014, now using single injection of arixtra daily. She has PICC and is very high risk for clots given metastatic gyn cancer, morbid obesity and extremely sedentary condition. 6.  Multifactorial anemia: chemo, blood draws, chronic disease. 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 Continuing chemo as single agent carboplatin now.  9. 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  Chemo orders completed for next carboplatin + carbo skin test due 11-18-13. Continue PICC dressing changes weekly at [facility].  LIVESAY,LENNIS P, MD   11/11/2013, 9:00 AM

## 2013-11-16 ENCOUNTER — Telehealth: Payer: Self-pay | Admitting: *Deleted

## 2013-11-16 ENCOUNTER — Telehealth: Payer: Self-pay | Admitting: Oncology

## 2013-11-16 NOTE — Telephone Encounter (Signed)
cld & spoke w/pt mother Tamela Oddi to adv of pt appt & trmt-mother stated she will get a copy of sch when she comes in on 7/29

## 2013-11-16 NOTE — Telephone Encounter (Signed)
Per [facility] staff phone call scheduled appts. Advised schedulers 

## 2013-11-18 ENCOUNTER — Other Ambulatory Visit (HOSPITAL_BASED_OUTPATIENT_CLINIC_OR_DEPARTMENT_OTHER): Payer: BC Managed Care – PPO

## 2013-11-18 ENCOUNTER — Other Ambulatory Visit: Payer: Self-pay | Admitting: *Deleted

## 2013-11-18 ENCOUNTER — Ambulatory Visit (HOSPITAL_BASED_OUTPATIENT_CLINIC_OR_DEPARTMENT_OTHER): Payer: BC Managed Care – PPO

## 2013-11-18 ENCOUNTER — Other Ambulatory Visit: Payer: BC Managed Care – PPO

## 2013-11-18 VITALS — BP 105/64 | HR 102 | Temp 97.5°F | Resp 19

## 2013-11-18 DIAGNOSIS — C549 Malignant neoplasm of corpus uteri, unspecified: Secondary | ICD-10-CM

## 2013-11-18 DIAGNOSIS — C541 Malignant neoplasm of endometrium: Secondary | ICD-10-CM

## 2013-11-18 DIAGNOSIS — Z95828 Presence of other vascular implants and grafts: Secondary | ICD-10-CM

## 2013-11-18 DIAGNOSIS — D6481 Anemia due to antineoplastic chemotherapy: Secondary | ICD-10-CM

## 2013-11-18 DIAGNOSIS — Z5111 Encounter for antineoplastic chemotherapy: Secondary | ICD-10-CM

## 2013-11-18 DIAGNOSIS — T451X5A Adverse effect of antineoplastic and immunosuppressive drugs, initial encounter: Secondary | ICD-10-CM

## 2013-11-18 DIAGNOSIS — Z452 Encounter for adjustment and management of vascular access device: Secondary | ICD-10-CM

## 2013-11-18 LAB — BASIC METABOLIC PANEL (CC13)
Anion Gap: 9 meq/L (ref 3–11)
BUN: 11.5 mg/dL (ref 7.0–26.0)
CO2: 31 meq/L — ABNORMAL HIGH (ref 22–29)
Calcium: 10.1 mg/dL (ref 8.4–10.4)
Chloride: 100 meq/L (ref 98–109)
Creatinine: 0.7 mg/dL (ref 0.6–1.1)
Glucose: 123 mg/dL (ref 70–140)
Potassium: 4 meq/L (ref 3.5–5.1)
Sodium: 140 meq/L (ref 136–145)

## 2013-11-18 LAB — CBC WITH DIFFERENTIAL/PLATELET
BASO%: 0.7 % (ref 0.0–2.0)
Basophils Absolute: 0 10e3/uL (ref 0.0–0.1)
EOS%: 0.7 % (ref 0.0–7.0)
Eosinophils Absolute: 0 10e3/uL (ref 0.0–0.5)
HCT: 35.6 % (ref 34.8–46.6)
HGB: 11.6 g/dL (ref 11.6–15.9)
LYMPH%: 23 % (ref 14.0–49.7)
MCH: 35.6 pg — ABNORMAL HIGH (ref 25.1–34.0)
MCHC: 32.7 g/dL (ref 31.5–36.0)
MCV: 108.9 fL — ABNORMAL HIGH (ref 79.5–101.0)
MONO#: 0.6 10e3/uL (ref 0.1–0.9)
MONO%: 9.6 % (ref 0.0–14.0)
NEUT#: 4 10e3/uL (ref 1.5–6.5)
NEUT%: 66 % (ref 38.4–76.8)
Platelets: 143 10e3/uL — ABNORMAL LOW (ref 145–400)
RBC: 3.27 10e6/uL — ABNORMAL LOW (ref 3.70–5.45)
RDW: 17.1 % — ABNORMAL HIGH (ref 11.2–14.5)
WBC: 6.1 10e3/uL (ref 3.9–10.3)
lymph#: 1.4 10e3/uL (ref 0.9–3.3)

## 2013-11-18 MED ORDER — SODIUM CHLORIDE 0.9 % IJ SOLN
10.0000 mL | INTRAMUSCULAR | Status: DC | PRN
  Administered 2013-11-18: 10 mL
  Filled 2013-11-18: qty 10

## 2013-11-18 MED ORDER — HEPARIN SOD (PORK) LOCK FLUSH 100 UNIT/ML IV SOLN
250.0000 [IU] | Freq: Once | INTRAVENOUS | Status: AC | PRN
  Administered 2013-11-18: 250 [IU]
  Filled 2013-11-18: qty 5

## 2013-11-18 MED ORDER — SODIUM CHLORIDE 0.9 % IJ SOLN
10.0000 mL | INTRAMUSCULAR | Status: DC | PRN
  Administered 2013-11-18: 10 mL via INTRAVENOUS
  Filled 2013-11-18: qty 10

## 2013-11-18 MED ORDER — FERROUS SULFATE 325 (65 FE) MG PO TABS: ORAL_TABLET | ORAL | Status: DC

## 2013-11-18 MED ORDER — DEXAMETHASONE SODIUM PHOSPHATE 20 MG/5ML IJ SOLN
12.0000 mg | Freq: Once | INTRAMUSCULAR | Status: AC
  Administered 2013-11-18: 12 mg via INTRAVENOUS

## 2013-11-18 MED ORDER — CARBOPLATIN CHEMO INTRADERMAL TEST DOSE 100MCG/0.02ML
100.0000 ug | Freq: Once | INTRADERMAL | Status: AC
  Administered 2013-11-18: 100 ug via INTRADERMAL
  Filled 2013-11-18: qty 0.01

## 2013-11-18 MED ORDER — ONDANSETRON 16 MG/50ML IVPB (CHCC)
16.0000 mg | Freq: Once | INTRAVENOUS | Status: AC
  Administered 2013-11-18: 16 mg via INTRAVENOUS

## 2013-11-18 MED ORDER — ONDANSETRON 16 MG/50ML IVPB (CHCC)
INTRAVENOUS | Status: AC
  Filled 2013-11-18: qty 16

## 2013-11-18 MED ORDER — SODIUM CHLORIDE 0.9 % IV SOLN
900.0000 mg | Freq: Once | INTRAVENOUS | Status: AC
  Administered 2013-11-18: 900 mg via INTRAVENOUS
  Filled 2013-11-18: qty 90

## 2013-11-18 MED ORDER — SODIUM CHLORIDE 0.9 % IV SOLN
150.0000 mg | Freq: Once | INTRAVENOUS | Status: AC
  Administered 2013-11-18: 150 mg via INTRAVENOUS
  Filled 2013-11-18: qty 5

## 2013-11-18 MED ORDER — DEXAMETHASONE SODIUM PHOSPHATE 20 MG/5ML IJ SOLN
INTRAMUSCULAR | Status: AC
  Filled 2013-11-18: qty 5

## 2013-11-18 MED ORDER — HEPARIN SOD (PORK) LOCK FLUSH 100 UNIT/ML IV SOLN
500.0000 [IU] | Freq: Once | INTRAVENOUS | Status: AC
  Administered 2013-11-18: 250 [IU] via INTRAVENOUS
  Filled 2013-11-18: qty 5

## 2013-11-18 MED ORDER — SODIUM CHLORIDE 0.9 % IV SOLN
Freq: Once | INTRAVENOUS | Status: AC
  Administered 2013-11-18: 13:00:00 via INTRAVENOUS

## 2013-11-18 NOTE — Patient Instructions (Signed)
[facility] Discharge Instructions for Patients Receiving Chemotherapy  Today you received the following chemotherapy agents Carboplatin  To help prevent nausea and vomiting after your treatment, we encourage you to take your nausea medication     If you develop nausea and vomiting that is not controlled by your nausea medication, call the clinic.   BELOW ARE SYMPTOMS THAT SHOULD BE REPORTED IMMEDIATELY:  *FEVER GREATER THAN 100.5 F  *CHILLS WITH OR WITHOUT FEVER  NAUSEA AND VOMITING THAT IS NOT CONTROLLED WITH YOUR NAUSEA MEDICATION  *UNUSUAL SHORTNESS OF BREATH  *UNUSUAL BRUISING OR BLEEDING  TENDERNESS IN MOUTH AND THROAT WITH OR WITHOUT PRESENCE OF ULCERS  *URINARY PROBLEMS  *BOWEL PROBLEMS  UNUSUAL RASH Items with * indicate a potential emergency and should be followed up as soon as possible.  Feel free to call the clinic you have any questions or concerns. The clinic phone number is [phone].    

## 2013-11-18 NOTE — Patient Instructions (Signed)
PICC Home Guide A peripherally inserted central catheter (PICC) is a long, thin, flexible tube that is inserted into a vein in the upper arm. It is a form of intravenous (IV) access. It is considered to be a "central" line because the tip of the PICC ends in a large vein in your chest. This large vein is called the superior vena cava (SVC). The PICC tip ends in the SVC because there is a lot of blood flow in the SVC. This allows medicines and IV fluids to be quickly distributed throughout the body. The PICC is inserted using a sterile technique by a specially trained nurse or physician. After the PICC is inserted, a chest X-ray exam is done to be sure it is in the correct place.  A PICC may be placed for different reasons, such as:  To give medicines and liquid nutrition that can only be given through a central line. Examples are:  Certain antibiotic treatments.  Chemotherapy.  Total parenteral nutrition (TPN).  To take frequent blood samples.  To give IV fluids and blood products.  If there is difficulty placing a peripheral intravenous (PIV) catheter. If taken care of properly, a PICC can remain in place for several months. A PICC can also allow a person to go home from the hospital early. Medicine and PICC care can be managed at home by a family member or home health care team. WHAT PROBLEMS CAN HAPPEN WHEN I HAVE A PICC? Problems with a PICC can occasionally occur. These may include the following:  A blood clot (thrombus) forming in or at the tip of the PICC. This can cause the PICC to become clogged. A clot-dissolving medicine called tissue plasminogen activator (tPA) can be given through the PICC to help break up the clot.  Inflammation of the vein (phlebitis) in which the PICC is placed. Signs of inflammation may include redness, pain at the insertion site, red streaks, or being able to feel a "cord" in the vein where the PICC is located.  Infection in the PICC or at the insertion  site. Signs of infection may include fever, chills, redness, swelling, or pus drainage from the PICC insertion site.  PICC movement (malposition). The PICC tip may move from its original position due to excessive physical activity, forceful coughing, sneezing, or vomiting.  A break or cut in the PICC. It is important to not use scissors near the PICC.  Nerve or tendon irritation or injury during PICC insertion. WHAT SHOULD I KEEP IN MIND ABOUT ACTIVITIES WHEN I HAVE A PICC?  You may bend your arm and move it freely. If your PICC is near or at the bend of your elbow, avoid activity with repeated motion at the elbow.  Rest at home for the remainder of the day following PICC line insertion.  Avoid lifting heavy objects as instructed by your health care provider.  Avoid using a crutch with the arm on the same side as your PICC. You may need to use a walker. WHAT SHOULD I KNOW ABOUT MY PICC DRESSING?  Keep your PICC bandage (dressing) clean and dry to prevent infection.  Ask your health care provider when you may shower. Ask your health care provider to teach you how to wrap the PICC when you do take a shower.  Change the PICC dressing as instructed by your health care provider.  Change your PICC dressing if it becomes loose or wet. WHAT SHOULD I KNOW ABOUT PICC CARE?  Check the PICC insertion site   daily for leakage, redness, swelling, or pain.  Do not take a bath, swim, or use hot tubs when you have a PICC. Cover PICC line with clear plastic wrap and tape to keep it dry while showering.  Flush the PICC as directed by your health care provider. Let your health care provider know right away if the PICC is difficult to flush or does not flush. Do not use force to flush the PICC.  Do not use a syringe that is less than 10 mL to flush the PICC.  Never pull or tug on the PICC.  Avoid blood pressure checks on the arm with the PICC.  Keep your PICC identification card with you at all  times.  Do not take the PICC out yourself. Only a trained clinical professional should remove the PICC. SEEK IMMEDIATE MEDICAL CARE IF:  Your PICC is accidentally pulled all the way out. If this happens, cover the insertion site with a bandage or gauze dressing. Do not throw the PICC away. Your health care provider will need to inspect it.  Your PICC was tugged or pulled and has partially come out. Do not  push the PICC back in.  There is any type of drainage, redness, or swelling where the PICC enters the skin.  You cannot flush the PICC, it is difficult to flush, or the PICC leaks around the insertion site when it is flushed.  You hear a "flushing" sound when the PICC is flushed.  You have pain, discomfort, or numbness in your arm, shoulder, or jaw on the same side as the PICC.  You feel your heart "racing" or skipping beats.  You notice a hole or tear in the PICC.  You develop chills or a fever. MAKE SURE YOU:   Understand these instructions.  Will watch your condition.  Will get help right away if you are not doing well or get worse. Document Released: 10/14/2002 Document Revised: 08/24/2013 Document Reviewed: 12/15/2012 Exit[employer] Patient Information 2015 [employer]. This information is not intended to replace advice given to you by your health care provider. Make sure you discuss any questions you have with your health care provider.  

## 2013-11-19 ENCOUNTER — Ambulatory Visit (HOSPITAL_BASED_OUTPATIENT_CLINIC_OR_DEPARTMENT_OTHER): Payer: BC Managed Care – PPO

## 2013-11-19 VITALS — BP 103/56 | HR 83 | Temp 97.8°F

## 2013-11-19 DIAGNOSIS — C541 Malignant neoplasm of endometrium: Secondary | ICD-10-CM

## 2013-11-19 DIAGNOSIS — C549 Malignant neoplasm of corpus uteri, unspecified: Secondary | ICD-10-CM

## 2013-11-19 DIAGNOSIS — Z5189 Encounter for other specified aftercare: Secondary | ICD-10-CM

## 2013-11-19 MED ORDER — PEGFILGRASTIM INJECTION 6 MG/0.6ML
6.0000 mg | Freq: Once | SUBCUTANEOUS | Status: AC
  Administered 2013-11-19: 6 mg via SUBCUTANEOUS
  Filled 2013-11-19: qty 0.6

## 2013-11-24 ENCOUNTER — Other Ambulatory Visit: Payer: Self-pay | Admitting: Oncology

## 2013-11-24 DIAGNOSIS — C549 Malignant neoplasm of corpus uteri, unspecified: Secondary | ICD-10-CM

## 2013-11-25 ENCOUNTER — Other Ambulatory Visit: Payer: Self-pay | Admitting: *Deleted

## 2013-11-25 ENCOUNTER — Ambulatory Visit (HOSPITAL_BASED_OUTPATIENT_CLINIC_OR_DEPARTMENT_OTHER): Payer: BC Managed Care – PPO

## 2013-11-25 DIAGNOSIS — C549 Malignant neoplasm of corpus uteri, unspecified: Secondary | ICD-10-CM

## 2013-11-25 DIAGNOSIS — Z452 Encounter for adjustment and management of vascular access device: Secondary | ICD-10-CM

## 2013-11-25 DIAGNOSIS — G629 Polyneuropathy, unspecified: Secondary | ICD-10-CM

## 2013-11-25 MED ORDER — GABAPENTIN 100 MG PO CAPS: 200.0000 mg | ORAL_CAPSULE | Freq: Three times a day (TID) | ORAL | Status: DC

## 2013-11-25 MED ORDER — HEPARIN SOD (PORK) LOCK FLUSH 100 UNIT/ML IV SOLN
500.0000 [IU] | Freq: Once | INTRAVENOUS | Status: AC
  Administered 2013-11-25: 250 [IU] via INTRAVENOUS
  Filled 2013-11-25: qty 5

## 2013-11-25 MED ORDER — SODIUM CHLORIDE 0.9 % IJ SOLN
10.0000 mL | INTRAMUSCULAR | Status: DC | PRN
  Administered 2013-11-25: 10 mL via INTRAVENOUS
  Filled 2013-11-25: qty 10

## 2013-11-25 NOTE — Patient Instructions (Signed)
PICC Home Guide A peripherally inserted central catheter (PICC) is a long, thin, flexible tube that is inserted into a vein in the upper arm. It is a form of intravenous (IV) access. It is considered to be a "central" line because the tip of the PICC ends in a large vein in your chest. This large vein is called the superior vena cava (SVC). The PICC tip ends in the SVC because there is a lot of blood flow in the SVC. This allows medicines and IV fluids to be quickly distributed throughout the body. The PICC is inserted using a sterile technique by a specially trained nurse or physician. After the PICC is inserted, a chest X-ray exam is done to be sure it is in the correct place.  A PICC may be placed for different reasons, such as:  To give medicines and liquid nutrition that can only be given through a central line. Examples are:  Certain antibiotic treatments.  Chemotherapy.  Total parenteral nutrition (TPN).  To take frequent blood samples.  To give IV fluids and blood products.  If there is difficulty placing a peripheral intravenous (PIV) catheter. If taken care of properly, a PICC can remain in place for several months. A PICC can also allow a person to go home from the hospital early. Medicine and PICC care can be managed at home by a family member or home health care team. WHAT PROBLEMS CAN HAPPEN WHEN I HAVE A PICC? Problems with a PICC can occasionally occur. These may include the following:  A blood clot (thrombus) forming in or at the tip of the PICC. This can cause the PICC to become clogged. A clot-dissolving medicine called tissue plasminogen activator (tPA) can be given through the PICC to help break up the clot.  Inflammation of the vein (phlebitis) in which the PICC is placed. Signs of inflammation may include redness, pain at the insertion site, red streaks, or being able to feel a "cord" in the vein where the PICC is located.  Infection in the PICC or at the insertion  site. Signs of infection may include fever, chills, redness, swelling, or pus drainage from the PICC insertion site.  PICC movement (malposition). The PICC tip may move from its original position due to excessive physical activity, forceful coughing, sneezing, or vomiting.  A break or cut in the PICC. It is important to not use scissors near the PICC.  Nerve or tendon irritation or injury during PICC insertion. WHAT SHOULD I KEEP IN MIND ABOUT ACTIVITIES WHEN I HAVE A PICC?  You may bend your arm and move it freely. If your PICC is near or at the bend of your elbow, avoid activity with repeated motion at the elbow.  Rest at home for the remainder of the day following PICC line insertion.  Avoid lifting heavy objects as instructed by your health care provider.  Avoid using a crutch with the arm on the same side as your PICC. You may need to use a walker. WHAT SHOULD I KNOW ABOUT MY PICC DRESSING?  Keep your PICC bandage (dressing) clean and dry to prevent infection.  Ask your health care provider when you may shower. Ask your health care provider to teach you how to wrap the PICC when you do take a shower.  Change the PICC dressing as instructed by your health care provider.  Change your PICC dressing if it becomes loose or wet. WHAT SHOULD I KNOW ABOUT PICC CARE?  Check the PICC insertion site   daily for leakage, redness, swelling, or pain.  Do not take a bath, swim, or use hot tubs when you have a PICC. Cover PICC line with clear plastic wrap and tape to keep it dry while showering.  Flush the PICC as directed by your health care provider. Let your health care provider know right away if the PICC is difficult to flush or does not flush. Do not use force to flush the PICC.  Do not use a syringe that is less than 10 mL to flush the PICC.  Never pull or tug on the PICC.  Avoid blood pressure checks on the arm with the PICC.  Keep your PICC identification card with you at all  times.  Do not take the PICC out yourself. Only a trained clinical professional should remove the PICC. SEEK IMMEDIATE MEDICAL CARE IF:  Your PICC is accidentally pulled all the way out. If this happens, cover the insertion site with a bandage or gauze dressing. Do not throw the PICC away. Your health care provider will need to inspect it.  Your PICC was tugged or pulled and has partially come out. Do not  push the PICC back in.  There is any type of drainage, redness, or swelling where the PICC enters the skin.  You cannot flush the PICC, it is difficult to flush, or the PICC leaks around the insertion site when it is flushed.  You hear a "flushing" sound when the PICC is flushed.  You have pain, discomfort, or numbness in your arm, shoulder, or jaw on the same side as the PICC.  You feel your heart "racing" or skipping beats.  You notice a hole or tear in the PICC.  You develop chills or a fever. MAKE SURE YOU:   Understand these instructions.  Will watch your condition.  Will get help right away if you are not doing well or get worse. Document Released: 10/14/2002 Document Revised: 08/24/2013 Document Reviewed: 12/15/2012 Exit[employer] Patient Information 2015 [employer]. This information is not intended to replace advice given to you by your health care provider. Make sure you discuss any questions you have with your health care provider.  

## 2013-12-02 ENCOUNTER — Other Ambulatory Visit: Payer: BC Managed Care – PPO

## 2013-12-02 ENCOUNTER — Ambulatory Visit (HOSPITAL_BASED_OUTPATIENT_CLINIC_OR_DEPARTMENT_OTHER): Payer: BC Managed Care – PPO

## 2013-12-02 ENCOUNTER — Ambulatory Visit: Payer: BC Managed Care – PPO | Admitting: Oncology

## 2013-12-02 DIAGNOSIS — Z452 Encounter for adjustment and management of vascular access device: Secondary | ICD-10-CM

## 2013-12-02 DIAGNOSIS — C549 Malignant neoplasm of corpus uteri, unspecified: Secondary | ICD-10-CM

## 2013-12-02 MED ORDER — HEPARIN SOD (PORK) LOCK FLUSH 100 UNIT/ML IV SOLN
500.0000 [IU] | Freq: Once | INTRAVENOUS | Status: AC
  Administered 2013-12-02: 250 [IU] via INTRAVENOUS
  Filled 2013-12-02: qty 5

## 2013-12-02 MED ORDER — SODIUM CHLORIDE 0.9 % IJ SOLN
10.0000 mL | INTRAMUSCULAR | Status: DC | PRN
  Administered 2013-12-02: 10 mL via INTRAVENOUS
  Filled 2013-12-02: qty 10

## 2013-12-02 NOTE — Patient Instructions (Signed)
PICC Home Guide A peripherally inserted central catheter (PICC) is a long, thin, flexible tube that is inserted into a vein in the upper arm. It is a form of intravenous (IV) access. It is considered to be a "central" line because the tip of the PICC ends in a large vein in your chest. This large vein is called the superior vena cava (SVC). The PICC tip ends in the SVC because there is a lot of blood flow in the SVC. This allows medicines and IV fluids to be quickly distributed throughout the body. The PICC is inserted using a sterile technique by a specially trained nurse or physician. After the PICC is inserted, a chest X-ray exam is done to be sure it is in the correct place.  A PICC may be placed for different reasons, such as:  To give medicines and liquid nutrition that can only be given through a central line. Examples are:  Certain antibiotic treatments.  Chemotherapy.  Total parenteral nutrition (TPN).  To take frequent blood samples.  To give IV fluids and blood products.  If there is difficulty placing a peripheral intravenous (PIV) catheter. If taken care of properly, a PICC can remain in place for several months. A PICC can also allow a person to go home from the hospital early. Medicine and PICC care can be managed at home by a family member or home health care team. WHAT PROBLEMS CAN HAPPEN WHEN I HAVE A PICC? Problems with a PICC can occasionally occur. These may include the following:  A blood clot (thrombus) forming in or at the tip of the PICC. This can cause the PICC to become clogged. A clot-dissolving medicine called tissue plasminogen activator (tPA) can be given through the PICC to help break up the clot.  Inflammation of the vein (phlebitis) in which the PICC is placed. Signs of inflammation may include redness, pain at the insertion site, red streaks, or being able to feel a "cord" in the vein where the PICC is located.  Infection in the PICC or at the insertion  site. Signs of infection may include fever, chills, redness, swelling, or pus drainage from the PICC insertion site.  PICC movement (malposition). The PICC tip may move from its original position due to excessive physical activity, forceful coughing, sneezing, or vomiting.  A break or cut in the PICC. It is important to not use scissors near the PICC.  Nerve or tendon irritation or injury during PICC insertion. WHAT SHOULD I KEEP IN MIND ABOUT ACTIVITIES WHEN I HAVE A PICC?  You may bend your arm and move it freely. If your PICC is near or at the bend of your elbow, avoid activity with repeated motion at the elbow.  Rest at home for the remainder of the day following PICC line insertion.  Avoid lifting heavy objects as instructed by your health care provider.  Avoid using a crutch with the arm on the same side as your PICC. You may need to use a walker. WHAT SHOULD I KNOW ABOUT MY PICC DRESSING?  Keep your PICC bandage (dressing) clean and dry to prevent infection.  Ask your health care provider when you may shower. Ask your health care provider to teach you how to wrap the PICC when you do take a shower.  Change the PICC dressing as instructed by your health care provider.  Change your PICC dressing if it becomes loose or wet. WHAT SHOULD I KNOW ABOUT PICC CARE?  Check the PICC insertion site   daily for leakage, redness, swelling, or pain.  Do not take a bath, swim, or use hot tubs when you have a PICC. Cover PICC line with clear plastic wrap and tape to keep it dry while showering.  Flush the PICC as directed by your health care provider. Let your health care provider know right away if the PICC is difficult to flush or does not flush. Do not use force to flush the PICC.  Do not use a syringe that is less than 10 mL to flush the PICC.  Never pull or tug on the PICC.  Avoid blood pressure checks on the arm with the PICC.  Keep your PICC identification card with you at all  times.  Do not take the PICC out yourself. Only a trained clinical professional should remove the PICC. SEEK IMMEDIATE MEDICAL CARE IF:  Your PICC is accidentally pulled all the way out. If this happens, cover the insertion site with a bandage or gauze dressing. Do not throw the PICC away. Your health care provider will need to inspect it.  Your PICC was tugged or pulled and has partially come out. Do not  push the PICC back in.  There is any type of drainage, redness, or swelling where the PICC enters the skin.  You cannot flush the PICC, it is difficult to flush, or the PICC leaks around the insertion site when it is flushed.  You hear a "flushing" sound when the PICC is flushed.  You have pain, discomfort, or numbness in your arm, shoulder, or jaw on the same side as the PICC.  You feel your heart "racing" or skipping beats.  You notice a hole or tear in the PICC.  You develop chills or a fever. MAKE SURE YOU:   Understand these instructions.  Will watch your condition.  Will get help right away if you are not doing well or get worse. Document Released: 10/14/2002 Document Revised: 08/24/2013 Document Reviewed: 12/15/2012 Exit[employer] Patient Information 2015 [employer]. This information is not intended to replace advice given to you by your health care provider. Make sure you discuss any questions you have with your health care provider.  

## 2013-12-08 ENCOUNTER — Other Ambulatory Visit: Payer: Self-pay | Admitting: Oncology

## 2013-12-09 ENCOUNTER — Other Ambulatory Visit: Payer: BC Managed Care – PPO

## 2013-12-09 ENCOUNTER — Encounter: Payer: Self-pay | Admitting: Oncology

## 2013-12-09 ENCOUNTER — Ambulatory Visit: Payer: BC Managed Care – PPO

## 2013-12-09 ENCOUNTER — Ambulatory Visit (HOSPITAL_BASED_OUTPATIENT_CLINIC_OR_DEPARTMENT_OTHER): Payer: BC Managed Care – PPO | Admitting: Oncology

## 2013-12-09 ENCOUNTER — Ambulatory Visit (HOSPITAL_BASED_OUTPATIENT_CLINIC_OR_DEPARTMENT_OTHER): Payer: BC Managed Care – PPO

## 2013-12-09 ENCOUNTER — Other Ambulatory Visit: Payer: Self-pay | Admitting: *Deleted

## 2013-12-09 ENCOUNTER — Ambulatory Visit (HOSPITAL_BASED_OUTPATIENT_CLINIC_OR_DEPARTMENT_OTHER): Payer: BC Managed Care – PPO | Admitting: *Deleted

## 2013-12-09 VITALS — BP 94/48 | HR 94 | Temp 98.1°F | Resp 18 | Ht 71.0 in | Wt >= 6400 oz

## 2013-12-09 DIAGNOSIS — C541 Malignant neoplasm of endometrium: Secondary | ICD-10-CM

## 2013-12-09 DIAGNOSIS — T451X5A Adverse effect of antineoplastic and immunosuppressive drugs, initial encounter: Secondary | ICD-10-CM

## 2013-12-09 DIAGNOSIS — C549 Malignant neoplasm of corpus uteri, unspecified: Secondary | ICD-10-CM

## 2013-12-09 DIAGNOSIS — D6481 Anemia due to antineoplastic chemotherapy: Secondary | ICD-10-CM

## 2013-12-09 DIAGNOSIS — G622 Polyneuropathy due to other toxic agents: Secondary | ICD-10-CM

## 2013-12-09 DIAGNOSIS — D638 Anemia in other chronic diseases classified elsewhere: Secondary | ICD-10-CM

## 2013-12-09 DIAGNOSIS — Z5111 Encounter for antineoplastic chemotherapy: Secondary | ICD-10-CM

## 2013-12-09 DIAGNOSIS — K219 Gastro-esophageal reflux disease without esophagitis: Secondary | ICD-10-CM

## 2013-12-09 DIAGNOSIS — Z452 Encounter for adjustment and management of vascular access device: Secondary | ICD-10-CM

## 2013-12-09 LAB — COMPREHENSIVE METABOLIC PANEL (CC13)
ALT: 21 U/L (ref 0–55)
AST: 21 U/L (ref 5–34)
Albumin: 3.1 g/dL — ABNORMAL LOW (ref 3.5–5.0)
Alkaline Phosphatase: 80 U/L (ref 40–150)
Anion Gap: 9 mEq/L (ref 3–11)
BILIRUBIN TOTAL: 0.26 mg/dL (ref 0.20–1.20)
BUN: 14.4 mg/dL (ref 7.0–26.0)
CO2: 30 meq/L — AB (ref 22–29)
Calcium: 9.5 mg/dL (ref 8.4–10.4)
Chloride: 101 mEq/L (ref 98–109)
Creatinine: 0.7 mg/dL (ref 0.6–1.1)
Glucose: 121 mg/dl (ref 70–140)
POTASSIUM: 3.9 meq/L (ref 3.5–5.1)
Sodium: 140 mEq/L (ref 136–145)
TOTAL PROTEIN: 6.7 g/dL (ref 6.4–8.3)

## 2013-12-09 LAB — CBC WITH DIFFERENTIAL/PLATELET
BASO%: 0.3 % (ref 0.0–2.0)
Basophils Absolute: 0 10*3/uL (ref 0.0–0.1)
EOS%: 0.9 % (ref 0.0–7.0)
Eosinophils Absolute: 0.1 10*3/uL (ref 0.0–0.5)
HCT: 33.6 % — ABNORMAL LOW (ref 34.8–46.6)
HGB: 11.1 g/dL — ABNORMAL LOW (ref 11.6–15.9)
LYMPH#: 1.3 10*3/uL (ref 0.9–3.3)
LYMPH%: 22.7 % (ref 14.0–49.7)
MCH: 35.5 pg — ABNORMAL HIGH (ref 25.1–34.0)
MCHC: 33 g/dL (ref 31.5–36.0)
MCV: 107.5 fL — ABNORMAL HIGH (ref 79.5–101.0)
MONO#: 0.5 10*3/uL (ref 0.1–0.9)
MONO%: 8.5 % (ref 0.0–14.0)
NEUT#: 4 10*3/uL (ref 1.5–6.5)
NEUT%: 67.6 % (ref 38.4–76.8)
PLATELETS: 138 10*3/uL — AB (ref 145–400)
RBC: 3.12 10*6/uL — ABNORMAL LOW (ref 3.70–5.45)
RDW: 16.7 % — ABNORMAL HIGH (ref 11.2–14.5)
WBC: 5.9 10*3/uL (ref 3.9–10.3)

## 2013-12-09 MED ORDER — SODIUM CHLORIDE 0.9 % IV SOLN
Freq: Once | INTRAVENOUS | Status: AC
  Administered 2013-12-09: 17:00:00 via INTRAVENOUS

## 2013-12-09 MED ORDER — SODIUM CHLORIDE 0.9 % IV SOLN
150.0000 mg | Freq: Once | INTRAVENOUS | Status: AC
  Administered 2013-12-09: 150 mg via INTRAVENOUS
  Filled 2013-12-09: qty 5

## 2013-12-09 MED ORDER — HEPARIN SOD (PORK) LOCK FLUSH 100 UNIT/ML IV SOLN
500.0000 [IU] | Freq: Once | INTRAVENOUS | Status: AC
  Administered 2013-12-09: 250 [IU] via INTRAVENOUS
  Filled 2013-12-09: qty 5

## 2013-12-09 MED ORDER — SODIUM CHLORIDE 0.9 % IJ SOLN
10.0000 mL | INTRAMUSCULAR | Status: DC | PRN
  Administered 2013-12-09: 10 mL via INTRAVENOUS
  Filled 2013-12-09: qty 10

## 2013-12-09 MED ORDER — ONDANSETRON 16 MG/50ML IVPB (CHCC)
16.0000 mg | Freq: Once | INTRAVENOUS | Status: AC
  Administered 2013-12-09: 16 mg via INTRAVENOUS

## 2013-12-09 MED ORDER — CARBOPLATIN CHEMO INTRADERMAL TEST DOSE 100MCG/0.02ML
100.0000 ug | Freq: Once | INTRADERMAL | Status: AC
  Administered 2013-12-09: 100 ug via INTRADERMAL
  Filled 2013-12-09: qty 0.02

## 2013-12-09 MED ORDER — ONDANSETRON 16 MG/50ML IVPB (CHCC)
INTRAVENOUS | Status: AC
  Filled 2013-12-09: qty 16

## 2013-12-09 MED ORDER — HEPARIN SOD (PORK) LOCK FLUSH 100 UNIT/ML IV SOLN
250.0000 [IU] | INTRAVENOUS | Status: AC | PRN
  Administered 2013-12-09: 250 [IU]
  Filled 2013-12-09: qty 5

## 2013-12-09 MED ORDER — DEXAMETHASONE SODIUM PHOSPHATE 20 MG/5ML IJ SOLN
INTRAMUSCULAR | Status: AC
  Filled 2013-12-09: qty 5

## 2013-12-09 MED ORDER — CYCLOBENZAPRINE HCL 5 MG PO TABS: 5.0000 mg | ORAL_TABLET | Freq: Three times a day (TID) | ORAL | Status: DC | PRN

## 2013-12-09 MED ORDER — SODIUM CHLORIDE 0.9 % IJ SOLN
10.0000 mL | INTRAMUSCULAR | Status: AC | PRN
  Administered 2013-12-09: 10 mL
  Filled 2013-12-09: qty 10

## 2013-12-09 MED ORDER — CARBOPLATIN CHEMO INJECTION 600 MG/60ML
900.0000 mg | Freq: Once | INTRAVENOUS | Status: AC
  Administered 2013-12-09: 900 mg via INTRAVENOUS
  Filled 2013-12-09: qty 90

## 2013-12-09 MED ORDER — DEXAMETHASONE SODIUM PHOSPHATE 20 MG/5ML IJ SOLN
12.0000 mg | Freq: Once | INTRAMUSCULAR | Status: AC
  Administered 2013-12-09: 12 mg via INTRAVENOUS

## 2013-12-09 NOTE — Patient Instructions (Signed)

## 2013-12-09 NOTE — Patient Instructions (Addendum)
[name] Discharge Instructions for Patients Receiving Chemotherapy  Today you received the following chemotherapy agents Carboplatin.  To help prevent nausea and vomiting after your treatment, we encourage you to take your nausea medication Zofran 8mg  twice a day for nausea. Phenergan 12.5mg  by mouth every 6 hours as needed for nausea.  Compazine 10mg  every 6 hours as needed for nausea.   If you develop nausea and vomiting that is not controlled by your nausea medication, call the clinic.   BELOW ARE SYMPTOMS THAT SHOULD BE REPORTED IMMEDIATELY:  *FEVER GREATER THAN 100.5 F  *CHILLS WITH OR WITHOUT FEVER  NAUSEA AND VOMITING THAT IS NOT CONTROLLED WITH YOUR NAUSEA MEDICATION  *UNUSUAL SHORTNESS OF BREATH  *UNUSUAL BRUISING OR BLEEDING  TENDERNESS IN MOUTH AND THROAT WITH OR WITHOUT PRESENCE OF ULCERS  *URINARY PROBLEMS  *BOWEL PROBLEMS  UNUSUAL RASH Items with * indicate a potential emergency and should be followed up as soon as possible.  Feel free to call the clinic you have any questions or concerns. The clinic phone number is [phone].

## 2013-12-09 NOTE — Progress Notes (Signed)
OFFICE PROGRESS NOTE   12/09/2013   Physicians:K.Khan, W.Brewster, Orpah Melter, M.Tsuei   INTERVAL HISTORY:  Patient is seen, together with mother, in continuing attention to recurrent endometrial carcinoma for which she continues q 3 week carboplatin to control the disease. She has done much better with chemotherapy since resolving C diff colitis and with adjustments in antiemetics. She requires neulasta support, given day 2 each cycle.  Patient had some fairly mild nausea for 2-3 days after last chemo on 7-29, but was able to eat a little and never had dry heaves. Bowels are moving regularly without diarrhea. She still needs muscle relaxer 1-2x daily for back and LE, since vertebral compression fracture fall 2014. She has had no fever or symptoms of infection. She has no overt bleeding, tolerating Arixtra injections.  She has PICC, flushed by mother at home and dressing changes at [facility] once weekly. No problems with that line, with good blood return today. She was able to go with family to favorite [place] last 2 weekends.  ONCOLOGIC HISTORY 1 Patient developed excessive uterine bleeding. Dilatation and curettage with hysteroscopy documented grade 1 endometrioid endometrial adenocarcinoma. Due to morbid obesity Mirena IUD was placed along with Aygestin. Patient had a dramatic directed weight loss with nutritional support she went from 523 pounds to 359 pounds in 10/2009. On 12/27/2009 she underwent a robotic-assisted laparoscopic hysterectomy, bilateral salpingo-oophorectomy, and mini laparotomy through the umbilical port with morcellation of uterus within about the delivery of the uterus. The final pathology revealed an endometrial adenocarcinoma grade 2 with invasion limited to 1 mm of the myometrium.  #2 Patient did well until 09/2012 when she developed right upper quadrant pain, presumed cholelithiasis. On 10/02/2012 she underwent a laparoscopic evaluation and was found to have  peritoneal carcinomatosis and ascites. Omental biopsies had metastatic adenocarcinoma consistent with recurrent endometrial carcinoma. She had 12 cycles taxol carboplatin from 11-04-12 thru 07-24-13 with neulasta day 2. CT AP after cycle 12 showed stable to minimally improved disease. With progressive neuropathy, plan is to continue single agent carboplatin for now.  #3 Abdominal ascites with multiple paracenteses.  #4 Back pain and subsequent MRI on 02/05/2013 revealed compression fractures of T10 and T11. Kyphoplasty 02/26/2013.  #6 Patient hospitalized and underwent more MRI's on 12/4 and L1 and L2 compression fractures were noted, after consultation with Dr. Sherwood Gambler, the patient was referred back to interventional radiology for evaluation for kyphoplasty and this was performed on 04/24/13.  #6. Right upper extremity DVT 04/13/13 related to [facility], which was removed. Now on once daily Arixtra  Review of systems as above, also: No LE swelling. No SOB or cough. Hair is growing back. Remainder of 10 point Review of Systems negative.  Objective:  Vital signs in last 24 hours:  BP 94/48  Pulse 94  Temp(Src) 98.1 F (36.7 C) (Oral)  Resp 18  Ht 5\' 11"  (1.803 m)  Wt 410 lb 1.6 oz (186.02 kg)  BMI 57.22 kg/m2 Weight is up 5 lbs. Alert, oriented and appropriate.  In WC, respirations not labored RA. Hair is growing back.  HEENT:PERRL, sclerae not icteric. Oral mucosa moist without lesions, posterior pharynx clear.  Neck supple. No JVD.  Lymphatics:no cervical,suraclavicular adenopathy Resp: clear to auscultation bilaterally  Cardio: regular rate and rhythm. No gallop. GI: abdomen obese, soft, nontender, not obviously distended, no appreciable mass or organomegaly. Some normal bowel sounds.  Musculoskeletal/ Extremities: without pitting edema, cords, tenderness. Chronic venous stasis changes lower legs bilaterally Neuro: no peripheral neuropathy. Otherwise nonfocal Skin  without rash, ecchymosis,  petechiae PICC insertion site -without erythema or tenderness  Lab Results:  Results for orders placed in visit on 12/09/13  CBC WITH DIFFERENTIAL      Result Value Ref Range   WBC 5.9  3.9 - 10.3 10e3/uL   NEUT# 4.0  1.5 - 6.5 10e3/uL   HGB 11.1 (*) 11.6 - 15.9 g/dL   HCT 33.6 (*) 34.8 - 46.6 %   Platelets 138 (*) 145 - 400 10e3/uL   MCV 107.5 (*) 79.5 - 101.0 fL   MCH 35.5 (*) 25.1 - 34.0 pg   MCHC 33.0  31.5 - 36.0 g/dL   RBC 3.12 (*) 3.70 - 5.45 10e6/uL   RDW 16.7 (*) 11.2 - 14.5 %   lymph# 1.3  0.9 - 3.3 10e3/uL   MONO# 0.5  0.1 - 0.9 10e3/uL   Eosinophils Absolute 0.1  0.0 - 0.5 10e3/uL   Basophils Absolute 0.0  0.0 - 0.1 10e3/uL   NEUT% 67.6  38.4 - 76.8 %   LYMPH% 22.7  14.0 - 49.7 %   MONO% 8.5  0.0 - 14.0 %   EOS% 0.9  0.0 - 7.0 %   BASO% 0.3  0.0 - 2.0 %  COMPREHENSIVE METABOLIC PANEL (EN27)      Result Value Ref Range   Sodium 140  136 - 145 mEq/L   Potassium 3.9  3.5 - 5.1 mEq/L   Chloride 101  98 - 109 mEq/L   CO2 30 (*) 22 - 29 mEq/L   Glucose 121  70 - 140 mg/dl   BUN 14.4  7.0 - 26.0 mg/dL   Creatinine 0.7  0.6 - 1.1 mg/dL   Total Bilirubin 0.26  0.20 - 1.20 mg/dL   Alkaline Phosphatase 80  40 - 150 U/L   AST 21  5 - 34 U/L   ALT 21  0 - 55 U/L   Total Protein 6.7  6.4 - 8.3 g/dL   Albumin 3.1 (*) 3.5 - 5.0 g/dL   Calcium 9.5  8.4 - 10.4 mg/dL   Anion Gap 9  3 - 11 mEq/L   labs reviewed with patient and mother now  Studies/Results:  No results found.  Medications: I have reviewed the patient's current medications.  DISCUSSION: I have mentioned again reason for carboplatin skin tests, and have told them that, if she does have reaction at some point, that we have a number of other options from which to choose.  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 Skin tests prior to each treatment and neulasta  day after. 2.PICC in: flushed by mother, dressing changes at [facility] weekly  3.C diff diarrhea resolved after second couse of flagyl in June 2015 4.morbid obesity  5.long term anticoagulation since PAC-associated DVT in Dec 2014, now using single injection of arixtra daily  6. Multifactorial anemia: chemo, blood draws, chronic disease. Tolerating oral iron. 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 Continuing chemo as single agent carboplatin now.  9. Probable cholelithiasis by scans  10. Degenerative arthritis T and L spine. 11. T spine compression fractures T10 and T11 Oct 2014 after fall, post kyphoplasties.  improved    Chemo and neulasta orders confirmed. Time spent 25 min including >50% counseling and coordination of care    Jaikob Borgwardt P, MD   12/09/2013, 12:18 PM

## 2013-12-10 ENCOUNTER — Ambulatory Visit (HOSPITAL_BASED_OUTPATIENT_CLINIC_OR_DEPARTMENT_OTHER): Payer: BC Managed Care – PPO

## 2013-12-10 VITALS — BP 87/44 | HR 86 | Temp 97.3°F

## 2013-12-10 DIAGNOSIS — C549 Malignant neoplasm of corpus uteri, unspecified: Secondary | ICD-10-CM

## 2013-12-10 DIAGNOSIS — C541 Malignant neoplasm of endometrium: Secondary | ICD-10-CM

## 2013-12-10 DIAGNOSIS — Z5189 Encounter for other specified aftercare: Secondary | ICD-10-CM

## 2013-12-10 MED ORDER — PEGFILGRASTIM INJECTION 6 MG/0.6ML
6.0000 mg | Freq: Once | SUBCUTANEOUS | Status: AC
  Administered 2013-12-10: 6 mg via SUBCUTANEOUS
  Filled 2013-12-10: qty 0.6

## 2013-12-15 ENCOUNTER — Telehealth: Payer: Self-pay | Admitting: *Deleted

## 2013-12-15 ENCOUNTER — Other Ambulatory Visit: Payer: Self-pay | Admitting: Oncology

## 2013-12-15 ENCOUNTER — Telehealth: Payer: Self-pay | Admitting: Oncology

## 2013-12-15 NOTE — Telephone Encounter (Signed)
Per staff message and P[facility] I have scheduled appts. Advised scheduler of appts. JMW  

## 2013-12-16 ENCOUNTER — Other Ambulatory Visit: Payer: BC Managed Care – PPO

## 2013-12-16 ENCOUNTER — Ambulatory Visit (HOSPITAL_BASED_OUTPATIENT_CLINIC_OR_DEPARTMENT_OTHER): Payer: BC Managed Care – PPO

## 2013-12-16 ENCOUNTER — Telehealth: Payer: Self-pay | Admitting: Oncology

## 2013-12-16 DIAGNOSIS — C549 Malignant neoplasm of corpus uteri, unspecified: Secondary | ICD-10-CM

## 2013-12-16 DIAGNOSIS — Z452 Encounter for adjustment and management of vascular access device: Secondary | ICD-10-CM

## 2013-12-16 MED ORDER — HEPARIN SOD (PORK) LOCK FLUSH 100 UNIT/ML IV SOLN
500.0000 [IU] | Freq: Once | INTRAVENOUS | Status: AC
  Administered 2013-12-16: 250 [IU] via INTRAVENOUS
  Filled 2013-12-16: qty 5

## 2013-12-16 MED ORDER — SODIUM CHLORIDE 0.9 % IJ SOLN
10.0000 mL | INTRAMUSCULAR | Status: DC | PRN
  Administered 2013-12-16: 10 mL via INTRAVENOUS
  Filled 2013-12-16: qty 10

## 2013-12-16 NOTE — Patient Instructions (Signed)
PICC Home Guide A peripherally inserted central catheter (PICC) is a long, thin, flexible tube that is inserted into a vein in the upper arm. It is a form of intravenous (IV) access. It is considered to be a "central" line because the tip of the PICC ends in a large vein in your chest. This large vein is called the superior vena cava (SVC). The PICC tip ends in the SVC because there is a lot of blood flow in the SVC. This allows medicines and IV fluids to be quickly distributed throughout the body. The PICC is inserted using a sterile technique by a specially trained nurse or physician. After the PICC is inserted, a chest X-ray exam is done to be sure it is in the correct place.  A PICC may be placed for different reasons, such as:  To give medicines and liquid nutrition that can only be given through a central line. Examples are:  Certain antibiotic treatments.  Chemotherapy.  Total parenteral nutrition (TPN).  To take frequent blood samples.  To give IV fluids and blood products.  If there is difficulty placing a peripheral intravenous (PIV) catheter. If taken care of properly, a PICC can remain in place for several months. A PICC can also allow a person to go home from the hospital early. Medicine and PICC care can be managed at home by a family member or home health care team. WHAT PROBLEMS CAN HAPPEN WHEN I HAVE A PICC? Problems with a PICC can occasionally occur. These may include the following:  A blood clot (thrombus) forming in or at the tip of the PICC. This can cause the PICC to become clogged. A clot-dissolving medicine called tissue plasminogen activator (tPA) can be given through the PICC to help break up the clot.  Inflammation of the vein (phlebitis) in which the PICC is placed. Signs of inflammation may include redness, pain at the insertion site, red streaks, or being able to feel a "cord" in the vein where the PICC is located.  Infection in the PICC or at the insertion  site. Signs of infection may include fever, chills, redness, swelling, or pus drainage from the PICC insertion site.  PICC movement (malposition). The PICC tip may move from its original position due to excessive physical activity, forceful coughing, sneezing, or vomiting.  A break or cut in the PICC. It is important to not use scissors near the PICC.  Nerve or tendon irritation or injury during PICC insertion. WHAT SHOULD I KEEP IN MIND ABOUT ACTIVITIES WHEN I HAVE A PICC?  You may bend your arm and move it freely. If your PICC is near or at the bend of your elbow, avoid activity with repeated motion at the elbow.  Rest at home for the remainder of the day following PICC line insertion.  Avoid lifting heavy objects as instructed by your health care provider.  Avoid using a crutch with the arm on the same side as your PICC. You may need to use a walker. WHAT SHOULD I KNOW ABOUT MY PICC DRESSING?  Keep your PICC bandage (dressing) clean and dry to prevent infection.  Ask your health care provider when you may shower. Ask your health care provider to teach you how to wrap the PICC when you do take a shower.  Change the PICC dressing as instructed by your health care provider.  Change your PICC dressing if it becomes loose or wet. WHAT SHOULD I KNOW ABOUT PICC CARE?  Check the PICC insertion site   daily for leakage, redness, swelling, or pain.  Do not take a bath, swim, or use hot tubs when you have a PICC. Cover PICC line with clear plastic wrap and tape to keep it dry while showering.  Flush the PICC as directed by your health care provider. Let your health care provider know right away if the PICC is difficult to flush or does not flush. Do not use force to flush the PICC.  Do not use a syringe that is less than 10 mL to flush the PICC.  Never pull or tug on the PICC.  Avoid blood pressure checks on the arm with the PICC.  Keep your PICC identification card with you at all  times.  Do not take the PICC out yourself. Only a trained clinical professional should remove the PICC. SEEK IMMEDIATE MEDICAL CARE IF:  Your PICC is accidentally pulled all the way out. If this happens, cover the insertion site with a bandage or gauze dressing. Do not throw the PICC away. Your health care provider will need to inspect it.  Your PICC was tugged or pulled and has partially come out. Do not  push the PICC back in.  There is any type of drainage, redness, or swelling where the PICC enters the skin.  You cannot flush the PICC, it is difficult to flush, or the PICC leaks around the insertion site when it is flushed.  You hear a "flushing" sound when the PICC is flushed.  You have pain, discomfort, or numbness in your arm, shoulder, or jaw on the same side as the PICC.  You feel your heart "racing" or skipping beats.  You notice a hole or tear in the PICC.  You develop chills or a fever. MAKE SURE YOU:   Understand these instructions.  Will watch your condition.  Will get help right away if you are not doing well or get worse. Document Released: 10/14/2002 Document Revised: 08/24/2013 Document Reviewed: 12/15/2012 Exit[employer] Patient Information 2015 [employer]. This information is not intended to replace advice given to you by your health care provider. Make sure you discuss any questions you have with your health care provider.  

## 2013-12-16 NOTE — Telephone Encounter (Signed)
, °

## 2013-12-18 ENCOUNTER — Other Ambulatory Visit: Payer: Self-pay | Admitting: *Deleted

## 2013-12-18 DIAGNOSIS — C549 Malignant neoplasm of corpus uteri, unspecified: Secondary | ICD-10-CM

## 2013-12-18 MED ORDER — PROMETHAZINE HCL 25 MG PO TABS: ORAL_TABLET | ORAL | Status: DC

## 2013-12-18 MED ORDER — ONDANSETRON HCL 8 MG PO TABS: ORAL_TABLET | ORAL | Status: DC

## 2013-12-23 ENCOUNTER — Other Ambulatory Visit: Payer: Self-pay | Admitting: *Deleted

## 2013-12-23 ENCOUNTER — Other Ambulatory Visit: Payer: BC Managed Care – PPO

## 2013-12-23 ENCOUNTER — Ambulatory Visit (HOSPITAL_BASED_OUTPATIENT_CLINIC_OR_DEPARTMENT_OTHER): Payer: BC Managed Care – PPO

## 2013-12-23 DIAGNOSIS — R1011 Right upper quadrant pain: Secondary | ICD-10-CM

## 2013-12-23 DIAGNOSIS — C549 Malignant neoplasm of corpus uteri, unspecified: Secondary | ICD-10-CM

## 2013-12-23 DIAGNOSIS — Z452 Encounter for adjustment and management of vascular access device: Secondary | ICD-10-CM

## 2013-12-23 DIAGNOSIS — Z95828 Presence of other vascular implants and grafts: Secondary | ICD-10-CM

## 2013-12-23 MED ORDER — HEPARIN SOD (PORK) LOCK FLUSH 100 UNIT/ML IV SOLN
500.0000 [IU] | Freq: Once | INTRAVENOUS | Status: AC
  Administered 2013-12-23: 250 [IU] via INTRAVENOUS
  Filled 2013-12-23: qty 5

## 2013-12-23 MED ORDER — SUCRALFATE 1 GM/10ML PO SUSP: ORAL | Status: DC

## 2013-12-23 MED ORDER — SODIUM CHLORIDE 0.9 % IJ SOLN
10.0000 mL | INTRAMUSCULAR | Status: DC | PRN
  Administered 2013-12-23: 10 mL via INTRAVENOUS
  Filled 2013-12-23: qty 10

## 2013-12-23 MED ORDER — PROCHLORPERAZINE MALEATE 10 MG PO TABS: ORAL_TABLET | ORAL | Status: DC

## 2013-12-23 NOTE — Patient Instructions (Signed)
PICC Home Guide A peripherally inserted central catheter (PICC) is a long, thin, flexible tube that is inserted into a vein in the upper arm. It is a form of intravenous (IV) access. It is considered to be a "central" line because the tip of the PICC ends in a large vein in your chest. This large vein is called the superior vena cava (SVC). The PICC tip ends in the SVC because there is a lot of blood flow in the SVC. This allows medicines and IV fluids to be quickly distributed throughout the body. The PICC is inserted using a sterile technique by a specially trained nurse or physician. After the PICC is inserted, a chest X-ray exam is done to be sure it is in the correct place.  A PICC may be placed for different reasons, such as:  To give medicines and liquid nutrition that can only be given through a central line. Examples are:  Certain antibiotic treatments.  Chemotherapy.  Total parenteral nutrition (TPN).  To take frequent blood samples.  To give IV fluids and blood products.  If there is difficulty placing a peripheral intravenous (PIV) catheter. If taken care of properly, a PICC can remain in place for several months. A PICC can also allow a person to go home from the hospital early. Medicine and PICC care can be managed at home by a family member or home health care team. WHAT PROBLEMS CAN HAPPEN WHEN I HAVE A PICC? Problems with a PICC can occasionally occur. These may include the following:  A blood clot (thrombus) forming in or at the tip of the PICC. This can cause the PICC to become clogged. A clot-dissolving medicine called tissue plasminogen activator (tPA) can be given through the PICC to help break up the clot.  Inflammation of the vein (phlebitis) in which the PICC is placed. Signs of inflammation may include redness, pain at the insertion site, red streaks, or being able to feel a "cord" in the vein where the PICC is located.  Infection in the PICC or at the insertion  site. Signs of infection may include fever, chills, redness, swelling, or pus drainage from the PICC insertion site.  PICC movement (malposition). The PICC tip may move from its original position due to excessive physical activity, forceful coughing, sneezing, or vomiting.  A break or cut in the PICC. It is important to not use scissors near the PICC.  Nerve or tendon irritation or injury during PICC insertion. WHAT SHOULD I KEEP IN MIND ABOUT ACTIVITIES WHEN I HAVE A PICC?  You may bend your arm and move it freely. If your PICC is near or at the bend of your elbow, avoid activity with repeated motion at the elbow.  Rest at home for the remainder of the day following PICC line insertion.  Avoid lifting heavy objects as instructed by your health care provider.  Avoid using a crutch with the arm on the same side as your PICC. You may need to use a walker. WHAT SHOULD I KNOW ABOUT MY PICC DRESSING?  Keep your PICC bandage (dressing) clean and dry to prevent infection.  Ask your health care provider when you may shower. Ask your health care provider to teach you how to wrap the PICC when you do take a shower.  Change the PICC dressing as instructed by your health care provider.  Change your PICC dressing if it becomes loose or wet. WHAT SHOULD I KNOW ABOUT PICC CARE?  Check the PICC insertion site   daily for leakage, redness, swelling, or pain.  Do not take a bath, swim, or use hot tubs when you have a PICC. Cover PICC line with clear plastic wrap and tape to keep it dry while showering.  Flush the PICC as directed by your health care provider. Let your health care provider know right away if the PICC is difficult to flush or does not flush. Do not use force to flush the PICC.  Do not use a syringe that is less than 10 mL to flush the PICC.  Never pull or tug on the PICC.  Avoid blood pressure checks on the arm with the PICC.  Keep your PICC identification card with you at all  times.  Do not take the PICC out yourself. Only a trained clinical professional should remove the PICC. SEEK IMMEDIATE MEDICAL CARE IF:  Your PICC is accidentally pulled all the way out. If this happens, cover the insertion site with a bandage or gauze dressing. Do not throw the PICC away. Your health care provider will need to inspect it.  Your PICC was tugged or pulled and has partially come out. Do not  push the PICC back in.  There is any type of drainage, redness, or swelling where the PICC enters the skin.  You cannot flush the PICC, it is difficult to flush, or the PICC leaks around the insertion site when it is flushed.  You hear a "flushing" sound when the PICC is flushed.  You have pain, discomfort, or numbness in your arm, shoulder, or jaw on the same side as the PICC.  You feel your heart "racing" or skipping beats.  You notice a hole or tear in the PICC.  You develop chills or a fever. MAKE SURE YOU:   Understand these instructions.  Will watch your condition.  Will get help right away if you are not doing well or get worse. Document Released: 10/14/2002 Document Revised: 08/24/2013 Document Reviewed: 12/15/2012 Exit[employer] Patient Information 2015 [employer]. This information is not intended to replace advice given to you by your health care provider. Make sure you discuss any questions you have with your health care provider.  

## 2013-12-25 ENCOUNTER — Other Ambulatory Visit: Payer: Self-pay | Admitting: *Deleted

## 2013-12-25 DIAGNOSIS — G629 Polyneuropathy, unspecified: Secondary | ICD-10-CM

## 2013-12-25 MED ORDER — GABAPENTIN 100 MG PO CAPS: 200.0000 mg | ORAL_CAPSULE | Freq: Three times a day (TID) | ORAL | Status: DC

## 2013-12-30 ENCOUNTER — Other Ambulatory Visit (HOSPITAL_BASED_OUTPATIENT_CLINIC_OR_DEPARTMENT_OTHER): Payer: BC Managed Care – PPO

## 2013-12-30 ENCOUNTER — Other Ambulatory Visit: Payer: BC Managed Care – PPO

## 2013-12-30 ENCOUNTER — Encounter: Payer: Self-pay | Admitting: Adult Health

## 2013-12-30 ENCOUNTER — Ambulatory Visit (HOSPITAL_BASED_OUTPATIENT_CLINIC_OR_DEPARTMENT_OTHER): Payer: BC Managed Care – PPO

## 2013-12-30 ENCOUNTER — Other Ambulatory Visit: Payer: Self-pay | Admitting: *Deleted

## 2013-12-30 ENCOUNTER — Ambulatory Visit: Payer: BC Managed Care – PPO

## 2013-12-30 ENCOUNTER — Other Ambulatory Visit: Payer: Self-pay | Admitting: Oncology

## 2013-12-30 ENCOUNTER — Ambulatory Visit (HOSPITAL_BASED_OUTPATIENT_CLINIC_OR_DEPARTMENT_OTHER): Payer: BC Managed Care – PPO | Admitting: Adult Health

## 2013-12-30 VITALS — BP 98/55 | HR 92 | Temp 97.9°F | Resp 20 | Ht 71.0 in | Wt >= 6400 oz

## 2013-12-30 DIAGNOSIS — Z5111 Encounter for antineoplastic chemotherapy: Secondary | ICD-10-CM

## 2013-12-30 DIAGNOSIS — C541 Malignant neoplasm of endometrium: Secondary | ICD-10-CM

## 2013-12-30 DIAGNOSIS — R197 Diarrhea, unspecified: Secondary | ICD-10-CM

## 2013-12-30 DIAGNOSIS — Z86718 Personal history of other venous thrombosis and embolism: Secondary | ICD-10-CM

## 2013-12-30 DIAGNOSIS — Z452 Encounter for adjustment and management of vascular access device: Secondary | ICD-10-CM

## 2013-12-30 DIAGNOSIS — C549 Malignant neoplasm of corpus uteri, unspecified: Secondary | ICD-10-CM

## 2013-12-30 DIAGNOSIS — I82220 Acute embolism and thrombosis of inferior vena cava: Secondary | ICD-10-CM

## 2013-12-30 LAB — CBC WITH DIFFERENTIAL/PLATELET
BASO%: 0.5 % (ref 0.0–2.0)
Basophils Absolute: 0 10*3/uL (ref 0.0–0.1)
EOS ABS: 0 10*3/uL (ref 0.0–0.5)
EOS%: 0.8 % (ref 0.0–7.0)
HCT: 34.2 % — ABNORMAL LOW (ref 34.8–46.6)
HGB: 11.2 g/dL — ABNORMAL LOW (ref 11.6–15.9)
LYMPH%: 24.9 % (ref 14.0–49.7)
MCH: 35.4 pg — AB (ref 25.1–34.0)
MCHC: 32.9 g/dL (ref 31.5–36.0)
MCV: 107.8 fL — AB (ref 79.5–101.0)
MONO#: 0.7 10*3/uL (ref 0.1–0.9)
MONO%: 10.8 % (ref 0.0–14.0)
NEUT#: 3.9 10*3/uL (ref 1.5–6.5)
NEUT%: 63 % (ref 38.4–76.8)
Platelets: 113 10*3/uL — ABNORMAL LOW (ref 145–400)
RBC: 3.17 10*6/uL — AB (ref 3.70–5.45)
RDW: 17.2 % — AB (ref 11.2–14.5)
WBC: 6.2 10*3/uL (ref 3.9–10.3)
lymph#: 1.5 10*3/uL (ref 0.9–3.3)

## 2013-12-30 LAB — COMPREHENSIVE METABOLIC PANEL (CC13)
ALBUMIN: 3.2 g/dL — AB (ref 3.5–5.0)
ALT: 25 U/L (ref 0–55)
AST: 25 U/L (ref 5–34)
Alkaline Phosphatase: 79 U/L (ref 40–150)
Anion Gap: 10 mEq/L (ref 3–11)
BILIRUBIN TOTAL: 0.37 mg/dL (ref 0.20–1.20)
BUN: 11.3 mg/dL (ref 7.0–26.0)
CO2: 27 mEq/L (ref 22–29)
Calcium: 9.3 mg/dL (ref 8.4–10.4)
Chloride: 103 mEq/L (ref 98–109)
Creatinine: 0.7 mg/dL (ref 0.6–1.1)
Glucose: 85 mg/dl (ref 70–140)
POTASSIUM: 4 meq/L (ref 3.5–5.1)
SODIUM: 141 meq/L (ref 136–145)
TOTAL PROTEIN: 6.7 g/dL (ref 6.4–8.3)

## 2013-12-30 MED ORDER — SODIUM CHLORIDE 0.9 % IV SOLN
900.0000 mg | Freq: Once | INTRAVENOUS | Status: AC
  Administered 2013-12-30: 900 mg via INTRAVENOUS
  Filled 2013-12-30: qty 90

## 2013-12-30 MED ORDER — SODIUM CHLORIDE 0.9 % IV SOLN
Freq: Once | INTRAVENOUS | Status: AC
  Administered 2013-12-30: 13:00:00 via INTRAVENOUS

## 2013-12-30 MED ORDER — OXYCODONE-ACETAMINOPHEN 10-325 MG PO TABS: 1.0000 | ORAL_TABLET | ORAL | Status: DC | PRN

## 2013-12-30 MED ORDER — ONDANSETRON 16 MG/50ML IVPB (CHCC)
INTRAVENOUS | Status: AC
  Filled 2013-12-30: qty 16

## 2013-12-30 MED ORDER — DEXAMETHASONE SODIUM PHOSPHATE 20 MG/5ML IJ SOLN
12.0000 mg | Freq: Once | INTRAMUSCULAR | Status: AC
  Administered 2013-12-30: 12 mg via INTRAVENOUS

## 2013-12-30 MED ORDER — CARBOPLATIN CHEMO INTRADERMAL TEST DOSE 100MCG/0.02ML
100.0000 ug | Freq: Once | INTRADERMAL | Status: AC
  Administered 2013-12-30: 100 ug via INTRADERMAL
  Filled 2013-12-30: qty 0.01

## 2013-12-30 MED ORDER — HEPARIN SOD (PORK) LOCK FLUSH 100 UNIT/ML IV SOLN
500.0000 [IU] | Freq: Once | INTRAVENOUS | Status: AC
  Administered 2013-12-30: 500 [IU] via INTRAVENOUS
  Filled 2013-12-30: qty 5

## 2013-12-30 MED ORDER — SODIUM CHLORIDE 0.9 % IJ SOLN
3.0000 mL | INTRAMUSCULAR | Status: DC | PRN
  Administered 2013-12-30: 3 mL via INTRAVENOUS
  Filled 2013-12-30: qty 10

## 2013-12-30 MED ORDER — DEXAMETHASONE SODIUM PHOSPHATE 20 MG/5ML IJ SOLN
INTRAMUSCULAR | Status: AC
  Filled 2013-12-30: qty 5

## 2013-12-30 MED ORDER — HEPARIN SOD (PORK) LOCK FLUSH 100 UNIT/ML IV SOLN
250.0000 [IU] | Freq: Once | INTRAVENOUS | Status: AC | PRN
  Administered 2013-12-30: 250 [IU]
  Filled 2013-12-30: qty 5

## 2013-12-30 MED ORDER — SODIUM CHLORIDE 0.9 % IJ SOLN
10.0000 mL | INTRAMUSCULAR | Status: DC | PRN
  Administered 2013-12-30: 10 mL via INTRAVENOUS
  Filled 2013-12-30: qty 10

## 2013-12-30 MED ORDER — ONDANSETRON 16 MG/50ML IVPB (CHCC)
16.0000 mg | Freq: Once | INTRAVENOUS | Status: AC
  Administered 2013-12-30: 16 mg via INTRAVENOUS

## 2013-12-30 MED ORDER — SODIUM CHLORIDE 0.9 % IV SOLN
150.0000 mg | Freq: Once | INTRAVENOUS | Status: AC
  Administered 2013-12-30: 150 mg via INTRAVENOUS
  Filled 2013-12-30: qty 5

## 2013-12-30 NOTE — Patient Instructions (Signed)
[facility] Discharge Instructions for Patients Receiving Chemotherapy  Today you received the following chemotherapy agents: Carboplatin  To help prevent nausea and vomiting after your treatment, we encourage you to take your nausea medication as prescribed by your physician.   If you develop nausea and vomiting that is not controlled by your nausea medication, call the clinic.   BELOW ARE SYMPTOMS THAT SHOULD BE REPORTED IMMEDIATELY:  *FEVER GREATER THAN 100.5 F  *CHILLS WITH OR WITHOUT FEVER  NAUSEA AND VOMITING THAT IS NOT CONTROLLED WITH YOUR NAUSEA MEDICATION  *UNUSUAL SHORTNESS OF BREATH  *UNUSUAL BRUISING OR BLEEDING  TENDERNESS IN MOUTH AND THROAT WITH OR WITHOUT PRESENCE OF ULCERS  *URINARY PROBLEMS  *BOWEL PROBLEMS  UNUSUAL RASH Items with * indicate a potential emergency and should be followed up as soon as possible.  Feel free to call the clinic you have any questions or concerns. The clinic phone number is [phone].    

## 2013-12-30 NOTE — Progress Notes (Signed)
OFFICE PROGRESS NOTE   12/30/2013   Physicians:K.Khan, W.Brewster, Orpah Melter, M.Tsuei   INTERVAL HISTORY:  Patient is seen, together with mother, in continuing attention to recurrent endometrial carcinoma for which she continues q 3 week carboplatin to control the disease. She has done much better with chemotherapy since resolving C diff colitis and with adjustments in antiemetics. She requires neulasta support, given day 2 each cycle.  She is currently cycle 7 day 1.    She is tolerating chemotherapy well.  She wants to know if she can take protonix daily because she gets sick without it.  She has mild neuropathy in her fingertips and feet.  She notes that it is improving.  She does have mild difficulty writing, and her balance is improving.  She is walking more than she was previously.  She is not in her wheelchair as much.  She denies fevers, chills, constipation, diarrhea, skin changes or any further concerns. She continues to receive Arixtra and is tolerating this well.    ONCOLOGIC HISTORY 1 Patient developed excessive uterine bleeding. Dilatation and curettage with hysteroscopy documented grade 1 endometrioid endometrial adenocarcinoma. Due to morbid obesity Mirena IUD was placed along with Aygestin. Patient had a dramatic directed weight loss with nutritional support she went from 523 pounds to 359 pounds in 10/2009. On 12/27/2009 she underwent a robotic-assisted laparoscopic hysterectomy, bilateral salpingo-oophorectomy, and mini laparotomy through the umbilical port with morcellation of uterus within about the delivery of the uterus. The final pathology revealed an endometrial adenocarcinoma grade 2 with invasion limited to 1 mm of the myometrium.   #2 Patient did well until 09/2012 when she developed right upper quadrant pain, presumed cholelithiasis. 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 Omental biopsies had  metastatic adenocarcinoma consistent with recurrent endometrial carcinoma. She had 12 cycles taxol carboplatin from 11-04-12 thru 07-24-13 with neulasta day 2. CT AP after cycle 12 showed stable to minimally improved disease. With progressive neuropathy, plan is to continue single agent carboplatin for now.   #3 Abdominal ascites with multiple paracenteses.   #4 Back pain and subsequent MRI on 02/05/2013 revealed compression fractures of T10 and T11. Kyphoplasty 02/26/2013.   #5 Patient hospitalized and underwent more MRI's on 12/4 and L1 and L2 compression fractures were noted, after consultation with Dr. Sherwood Gambler, the patient was referred back to interventional radiology for evaluation for kyphoplasty and this was performed on 04/24/13.   #6. Right upper extremity DVT 04/13/13 related to [facility], which was removed. Now on once daily Arixtra  Review of systems as above, also: A 10 point review of systems was conducted and is otherwise negative except for what is noted above.    Objective:  Vital signs in last 24 hours:  BP 98/55  Pulse 92  Temp(Src) 97.9 F (36.6 C) (Oral)  Resp 20  Ht 5\' 11"  (1.803 m)  Wt 415 lb 14.4 oz (188.651 kg)  BMI 58.03 kg/m2 Weight is up 5 lbs. Alert, oriented and appropriate.  In WC, respirations not labored RA. Hair is growing back.  HEENT:PERRL, sclerae not icteric. Oral mucosa moist without lesions, posterior pharynx clear.  Neck supple. No JVD.  Lymphatics:no cervical,suraclavicular adenopathy Resp: clear to auscultation bilaterally  Cardio: regular rate and rhythm. No gallop. GI: abdomen obese, soft, nontender, not obviously distended, no appreciable mass or organomegaly. Some normal bowel sounds.  Musculoskeletal/ Extremities: without pitting edema, cords, tenderness. Chronic venous stasis changes lower legs bilaterally Neuro: no peripheral  neuropathy. Otherwise nonfocal Skin without rash, ecchymosis, petechiae PICC insertion site -without erythema or  tenderness  Lab Results:  Results for orders placed in visit on 12/30/13  CBC WITH DIFFERENTIAL      Result Value Ref Range   WBC 6.2  3.9 - 10.3 10e3/uL   NEUT# 3.9  1.5 - 6.5 10e3/uL   HGB 11.2 (*) 11.6 - 15.9 g/dL   HCT 34.2 (*) 34.8 - 46.6 %   Platelets 113 (*) 145 - 400 10e3/uL   MCV 107.8 (*) 79.5 - 101.0 fL   MCH 35.4 (*) 25.1 - 34.0 pg   MCHC 32.9  31.5 - 36.0 g/dL   RBC 3.17 (*) 3.70 - 5.45 10e6/uL   RDW 17.2 (*) 11.2 - 14.5 %   lymph# 1.5  0.9 - 3.3 10e3/uL   MONO# 0.7  0.1 - 0.9 10e3/uL   Eosinophils Absolute 0.0  0.0 - 0.5 10e3/uL   Basophils Absolute 0.0  0.0 - 0.1 10e3/uL   NEUT% 63.0  38.4 - 76.8 %   LYMPH% 24.9  14.0 - 49.7 %   MONO% 10.8  0.0 - 14.0 %   EOS% 0.8  0.0 - 7.0 %   BASO% 0.5  0.0 - 2.0 %  COMPREHENSIVE METABOLIC PANEL (NK53)      Result Value Ref Range   Sodium 141  136 - 145 mEq/L   Potassium 4.0  3.5 - 5.1 mEq/L   Chloride 103  98 - 109 mEq/L   CO2 27  22 - 29 mEq/L   Glucose 85  70 - 140 mg/dl   BUN 11.3  7.0 - 26.0 mg/dL   Creatinine 0.7  0.6 - 1.1 mg/dL   Total Bilirubin 0.37  0.20 - 1.20 mg/dL   Alkaline Phosphatase 79  40 - 150 U/L   AST 25  5 - 34 U/L   ALT 25  0 - 55 U/L   Total Protein 6.7  6.4 - 8.3 g/dL   Albumin 3.2 (*) 3.5 - 5.0 g/dL   Calcium 9.3  8.4 - 10.4 mg/dL   Anion Gap 10  3 - 11 mEq/L   labs reviewed with patient and mother now  Studies/Results:  No results found.  Medications:  Current Outpatient Prescriptions  Medication Sig Dispense Refill  . Ascorbic Acid (VITAMIN C) 1000 MG tablet Take 1,000 mg by mouth daily.      Marland Kitchen b complex vitamins capsule Take 1 capsule by mouth daily.      . calcium carbonate (OS-CAL) 600 MG TABS tablet Take 600 mg by mouth daily.      . Cholecalciferol (VITAMIN D3) 2000 UNITS TABS Take 1 tablet by mouth daily.      . Cyanocobalamin (VITAMIN B-12 PO) Take 2,500 mg by mouth daily.       . cyclobenzaprine (FLEXERIL) 5 MG tablet Take 1 tablet (5 mg total) by mouth 3 (three) times  daily as needed for muscle spasms.  60 tablet  0  . ferrous sulfate 325 (65 FE) MG tablet TAKE 1 TABLET BY MOUTH 3 TIMES A DAY WITH MEALS  90 tablet  2  . folic acid (FOLVITE) 976 MCG tablet Take 400 mcg by mouth daily.        . fondaparinux (ARIXTRA) 10 MG/0.8ML SOLN injection INJECT 1 SYRINGE SUBCUTANEOUSLY ONCE A DAY  24 mL  0  . furosemide (LASIX) 20 MG tablet Take 1 tablet (20 mg total) by mouth every other day.  30 tablet  0  .  gabapentin (NEURONTIN) 100 MG capsule Take 2 capsules (200 mg total) by mouth 3 (three) times daily.  180 capsule  3  . Heparin Lock Flush (HEPARIN FLUSH, PORCINE,) 100 UNIT/ML injection FLUSH EACH LUMEN WITH 250 UNITS (2.5ML) DAILY. USE NEW SYRINGE FOR EACH LUMEN.  300 mL  0  . loratadine (CLARITIN) 10 MG tablet Take 10 mg by mouth daily.      . magnesium gluconate (MAGONATE) 500 MG tablet Take 500 mg by mouth daily.      . metroNIDAZOLE (FLAGYL) 500 MG tablet       . miconazole nitrate (MICATIN) POWD Apply 1 application topically 4 (four) times daily.  10 g  2  . Multiple Vitamins-Minerals (MULTIVITAMIN WITH MINERALS) tablet Take 1 tablet by mouth daily.       Marland Kitchen nystatin ointment (MYCOSTATIN) Apply 1 application topically 2 (two) times daily.      . ondansetron (ZOFRAN ODT) 8 MG disintegrating tablet Take 1 tablet (8 mg total) by mouth every 8 (eight) hours as needed for nausea or vomiting.  20 tablet  0  . ondansetron (ZOFRAN) 8 MG tablet TAKE 1 TABLET BY MOUTH TWICE A DAY AS NEEDED START DAY AFTER CHEMO  20 tablet  0  . pantoprazole (PROTONIX) 40 MG tablet TAKE 1 TABLET (40 MG TOTAL) BY MOUTH DAILY.  30 tablet  6  . potassium chloride (K-DUR) 10 MEQ tablet Take 2 tablets (20 mEq total) by mouth daily.  14 tablet  0  . promethazine (PHENERGAN) 25 MG tablet TAKE 1/2 TABLET BY MOUTH EVERY 6 HOURS AS NEEDED FOR NAUSEA  15 tablet  0  . sucralfate (CARAFATE) 1 GM/10ML suspension TAKE 10 MLS BY MOUTH 4 TIMES A DAY  420 mL  2  . oxyCODONE-acetaminophen (PERCOCET) 10-325  MG per tablet Take 1 tablet by mouth every 4 (four) hours as needed for pain.  90 tablet  0  . prochlorperazine (COMPAZINE) 10 MG tablet TAKE 1 TABLET BY MOUTH EVERY 6 HOURS AS NEEDED FOR NAUSEA AND VOMITING  30 tablet  2   No current facility-administered medications for this visit.   Facility-Administered Medications Ordered in Other Visits  Medication Dose Route Frequency Provider Last Rate Last Dose  . 0.9 %  sodium chloride infusion   Intravenous Once Lennis P Livesay, MD      . CARBOplatin (PARAPLATIN) 900 mg in sodium chloride 0.9 % 500 mL chemo infusion  900 mg Intravenous Once Lennis Marion Downer, MD      . Dexamethasone Sodium Phosphate (DECADRON) injection 12 mg  12 mg Intravenous Once Lennis P Livesay, MD      . fosaprepitant (EMEND) 150 mg in sodium chloride 0.9 % 145 mL IVPB  150 mg Intravenous Once Lennis P Livesay, MD      . heparin lock flush 100 unit/mL  250 Units Intracatheter Once PRN Lennis Marion Downer, MD      . ondansetron (ZOFRAN) IVPB 16 mg  16 mg Intravenous Once Lennis P Livesay, MD      . sodium chloride 0.9 % injection 3 mL  3 mL Intravenous PRN Lennis Marion Downer, MD       DISCUSSION:  [name] continues to tolerate her chemotherapy well and will proceed with her seventh cycle of Carboplatin today.  She is walking more and able to ride in a car.  She is going to take Protonix daily for her GERD.  I refilled her percocet today.   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 Skin tests prior to each treatment and neulasta day after. 2.PICC in: flushed by mother, dressing changes at [facility] weekly  3.C diff diarrhea resolved after second couse of flagyl in June 2015 4.morbid obesity  5.long term anticoagulation since PAC-associated DVT in Dec 2014, now using single injection of arixtra daily  6. Multifactorial anemia: chemo, blood  draws, chronic disease. Tolerating oral iron. 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 Continuing chemo as single agent carboplatin now.  9. Probable cholelithiasis by scans  10. Degenerative arthritis T and L spine. 11. T spine compression fractures T10 and T11 Oct 2014 after fall, post kyphoplasties.  improved    Chemo and neulasta orders confirmed. Time spent 25 min including >50% counseling and coordination of care    Minette Headland, [place] [phone] 12/30/2013, 12:37 PM

## 2013-12-30 NOTE — Patient Instructions (Signed)
PICC Home Guide A peripherally inserted central catheter (PICC) is a long, thin, flexible tube that is inserted into a vein in the upper arm. It is a form of intravenous (IV) access. It is considered to be a "central" line because the tip of the PICC ends in a large vein in your chest. This large vein is called the superior vena cava (SVC). The PICC tip ends in the SVC because there is a lot of blood flow in the SVC. This allows medicines and IV fluids to be quickly distributed throughout the body. The PICC is inserted using a sterile technique by a specially trained nurse or physician. After the PICC is inserted, a chest X-ray exam is done to be sure it is in the correct place.  A PICC may be placed for different reasons, such as:  To give medicines and liquid nutrition that can only be given through a central line. Examples are:  Certain antibiotic treatments.  Chemotherapy.  Total parenteral nutrition (TPN).  To take frequent blood samples.  To give IV fluids and blood products.  If there is difficulty placing a peripheral intravenous (PIV) catheter. If taken care of properly, a PICC can remain in place for several months. A PICC can also allow a person to go home from the hospital early. Medicine and PICC care can be managed at home by a family member or home health care team. WHAT PROBLEMS CAN HAPPEN WHEN I HAVE A PICC? Problems with a PICC can occasionally occur. These may include the following:  A blood clot (thrombus) forming in or at the tip of the PICC. This can cause the PICC to become clogged. A clot-dissolving medicine called tissue plasminogen activator (tPA) can be given through the PICC to help break up the clot.  Inflammation of the vein (phlebitis) in which the PICC is placed. Signs of inflammation may include redness, pain at the insertion site, red streaks, or being able to feel a "cord" in the vein where the PICC is located.  Infection in the PICC or at the insertion  site. Signs of infection may include fever, chills, redness, swelling, or pus drainage from the PICC insertion site.  PICC movement (malposition). The PICC tip may move from its original position due to excessive physical activity, forceful coughing, sneezing, or vomiting.  A break or cut in the PICC. It is important to not use scissors near the PICC.  Nerve or tendon irritation or injury during PICC insertion. WHAT SHOULD I KEEP IN MIND ABOUT ACTIVITIES WHEN I HAVE A PICC?  You may bend your arm and move it freely. If your PICC is near or at the bend of your elbow, avoid activity with repeated motion at the elbow.  Rest at home for the remainder of the day following PICC line insertion.  Avoid lifting heavy objects as instructed by your health care provider.  Avoid using a crutch with the arm on the same side as your PICC. You may need to use a walker. WHAT SHOULD I KNOW ABOUT MY PICC DRESSING?  Keep your PICC bandage (dressing) clean and dry to prevent infection.  Ask your health care provider when you may shower. Ask your health care provider to teach you how to wrap the PICC when you do take a shower.  Change the PICC dressing as instructed by your health care provider.  Change your PICC dressing if it becomes loose or wet. WHAT SHOULD I KNOW ABOUT PICC CARE?  Check the PICC insertion site   daily for leakage, redness, swelling, or pain.  Do not take a bath, swim, or use hot tubs when you have a PICC. Cover PICC line with clear plastic wrap and tape to keep it dry while showering.  Flush the PICC as directed by your health care provider. Let your health care provider know right away if the PICC is difficult to flush or does not flush. Do not use force to flush the PICC.  Do not use a syringe that is less than 10 mL to flush the PICC.  Never pull or tug on the PICC.  Avoid blood pressure checks on the arm with the PICC.  Keep your PICC identification card with you at all  times.  Do not take the PICC out yourself. Only a trained clinical professional should remove the PICC. SEEK IMMEDIATE MEDICAL CARE IF:  Your PICC is accidentally pulled all the way out. If this happens, cover the insertion site with a bandage or gauze dressing. Do not throw the PICC away. Your health care provider will need to inspect it.  Your PICC was tugged or pulled and has partially come out. Do not  push the PICC back in.  There is any type of drainage, redness, or swelling where the PICC enters the skin.  You cannot flush the PICC, it is difficult to flush, or the PICC leaks around the insertion site when it is flushed.  You hear a "flushing" sound when the PICC is flushed.  You have pain, discomfort, or numbness in your arm, shoulder, or jaw on the same side as the PICC.  You feel your heart "racing" or skipping beats.  You notice a hole or tear in the PICC.  You develop chills or a fever. MAKE SURE YOU:   Understand these instructions.  Will watch your condition.  Will get help right away if you are not doing well or get worse. Document Released: 10/14/2002 Document Revised: 08/24/2013 Document Reviewed: 12/15/2012 Exit[employer] Patient Information 2015 [employer]. This information is not intended to replace advice given to you by your health care provider. Make sure you discuss any questions you have with your health care provider.  

## 2013-12-31 ENCOUNTER — Ambulatory Visit (HOSPITAL_BASED_OUTPATIENT_CLINIC_OR_DEPARTMENT_OTHER): Payer: BC Managed Care – PPO

## 2013-12-31 VITALS — BP 96/59 | HR 82 | Temp 97.7°F

## 2013-12-31 DIAGNOSIS — C541 Malignant neoplasm of endometrium: Secondary | ICD-10-CM

## 2013-12-31 DIAGNOSIS — C549 Malignant neoplasm of corpus uteri, unspecified: Secondary | ICD-10-CM

## 2013-12-31 DIAGNOSIS — Z452 Encounter for adjustment and management of vascular access device: Secondary | ICD-10-CM

## 2013-12-31 MED ORDER — PEGFILGRASTIM INJECTION 6 MG/0.6ML
6.0000 mg | Freq: Once | SUBCUTANEOUS | Status: AC
  Administered 2013-12-31: 6 mg via SUBCUTANEOUS
  Filled 2013-12-31: qty 0.6

## 2014-01-06 ENCOUNTER — Ambulatory Visit (HOSPITAL_BASED_OUTPATIENT_CLINIC_OR_DEPARTMENT_OTHER): Payer: BC Managed Care – PPO

## 2014-01-06 DIAGNOSIS — Z452 Encounter for adjustment and management of vascular access device: Secondary | ICD-10-CM

## 2014-01-06 DIAGNOSIS — C549 Malignant neoplasm of corpus uteri, unspecified: Secondary | ICD-10-CM

## 2014-01-06 MED ORDER — SODIUM CHLORIDE 0.9 % IJ SOLN
10.0000 mL | INTRAMUSCULAR | Status: DC | PRN
  Administered 2014-01-06: 10 mL via INTRAVENOUS
  Filled 2014-01-06: qty 10

## 2014-01-06 MED ORDER — HEPARIN SOD (PORK) LOCK FLUSH 100 UNIT/ML IV SOLN
500.0000 [IU] | Freq: Once | INTRAVENOUS | Status: AC
  Administered 2014-01-06: 250 [IU] via INTRAVENOUS
  Filled 2014-01-06: qty 5

## 2014-01-06 NOTE — Patient Instructions (Signed)
PICC Home Guide A peripherally inserted central catheter (PICC) is a long, thin, flexible tube that is inserted into a vein in the upper arm. It is a form of intravenous (IV) access. It is considered to be a "central" line because the tip of the PICC ends in a large vein in your chest. This large vein is called the superior vena cava (SVC). The PICC tip ends in the SVC because there is a lot of blood flow in the SVC. This allows medicines and IV fluids to be quickly distributed throughout the body. The PICC is inserted using a sterile technique by a specially trained nurse or physician. After the PICC is inserted, a chest X-ray exam is done to be sure it is in the correct place.  A PICC may be placed for different reasons, such as:  To give medicines and liquid nutrition that can only be given through a central line. Examples are:  Certain antibiotic treatments.  Chemotherapy.  Total parenteral nutrition (TPN).  To take frequent blood samples.  To give IV fluids and blood products.  If there is difficulty placing a peripheral intravenous (PIV) catheter. If taken care of properly, a PICC can remain in place for several months. A PICC can also allow a person to go home from the hospital early. Medicine and PICC care can be managed at home by a family member or home health care team. WHAT PROBLEMS CAN HAPPEN WHEN I HAVE A PICC? Problems with a PICC can occasionally occur. These may include the following:  A blood clot (thrombus) forming in or at the tip of the PICC. This can cause the PICC to become clogged. A clot-dissolving medicine called tissue plasminogen activator (tPA) can be given through the PICC to help break up the clot.  Inflammation of the vein (phlebitis) in which the PICC is placed. Signs of inflammation may include redness, pain at the insertion site, red streaks, or being able to feel a "cord" in the vein where the PICC is located.  Infection in the PICC or at the insertion  site. Signs of infection may include fever, chills, redness, swelling, or pus drainage from the PICC insertion site.  PICC movement (malposition). The PICC tip may move from its original position due to excessive physical activity, forceful coughing, sneezing, or vomiting.  A break or cut in the PICC. It is important to not use scissors near the PICC.  Nerve or tendon irritation or injury during PICC insertion. WHAT SHOULD I KEEP IN MIND ABOUT ACTIVITIES WHEN I HAVE A PICC?  You may bend your arm and move it freely. If your PICC is near or at the bend of your elbow, avoid activity with repeated motion at the elbow.  Rest at home for the remainder of the day following PICC line insertion.  Avoid lifting heavy objects as instructed by your health care provider.  Avoid using a crutch with the arm on the same side as your PICC. You may need to use a walker. WHAT SHOULD I KNOW ABOUT MY PICC DRESSING?  Keep your PICC bandage (dressing) clean and dry to prevent infection.  Ask your health care provider when you may shower. Ask your health care provider to teach you how to wrap the PICC when you do take a shower.  Change the PICC dressing as instructed by your health care provider.  Change your PICC dressing if it becomes loose or wet. WHAT SHOULD I KNOW ABOUT PICC CARE?  Check the PICC insertion site   daily for leakage, redness, swelling, or pain.  Do not take a bath, swim, or use hot tubs when you have a PICC. Cover PICC line with clear plastic wrap and tape to keep it dry while showering.  Flush the PICC as directed by your health care provider. Let your health care provider know right away if the PICC is difficult to flush or does not flush. Do not use force to flush the PICC.  Do not use a syringe that is less than 10 mL to flush the PICC.  Never pull or tug on the PICC.  Avoid blood pressure checks on the arm with the PICC.  Keep your PICC identification card with you at all  times.  Do not take the PICC out yourself. Only a trained clinical professional should remove the PICC. SEEK IMMEDIATE MEDICAL CARE IF:  Your PICC is accidentally pulled all the way out. If this happens, cover the insertion site with a bandage or gauze dressing. Do not throw the PICC away. Your health care provider will need to inspect it.  Your PICC was tugged or pulled and has partially come out. Do not  push the PICC back in.  There is any type of drainage, redness, or swelling where the PICC enters the skin.  You cannot flush the PICC, it is difficult to flush, or the PICC leaks around the insertion site when it is flushed.  You hear a "flushing" sound when the PICC is flushed.  You have pain, discomfort, or numbness in your arm, shoulder, or jaw on the same side as the PICC.  You feel your heart "racing" or skipping beats.  You notice a hole or tear in the PICC.  You develop chills or a fever. MAKE SURE YOU:   Understand these instructions.  Will watch your condition.  Will get help right away if you are not doing well or get worse. Document Released: 10/14/2002 Document Revised: 08/24/2013 Document Reviewed: 12/15/2012 Exit[employer] Patient Information 2015 [employer]. This information is not intended to replace advice given to you by your health care provider. Make sure you discuss any questions you have with your health care provider.  

## 2014-01-07 ENCOUNTER — Other Ambulatory Visit: Payer: Self-pay | Admitting: *Deleted

## 2014-01-07 DIAGNOSIS — C549 Malignant neoplasm of corpus uteri, unspecified: Secondary | ICD-10-CM

## 2014-01-07 MED ORDER — ONDANSETRON HCL 8 MG PO TABS: ORAL_TABLET | ORAL | Status: DC

## 2014-01-13 ENCOUNTER — Ambulatory Visit (HOSPITAL_BASED_OUTPATIENT_CLINIC_OR_DEPARTMENT_OTHER): Payer: BC Managed Care – PPO

## 2014-01-13 DIAGNOSIS — C549 Malignant neoplasm of corpus uteri, unspecified: Secondary | ICD-10-CM

## 2014-01-13 DIAGNOSIS — Z452 Encounter for adjustment and management of vascular access device: Secondary | ICD-10-CM

## 2014-01-13 MED ORDER — HEPARIN SOD (PORK) LOCK FLUSH 100 UNIT/ML IV SOLN
500.0000 [IU] | Freq: Once | INTRAVENOUS | Status: AC
  Administered 2014-01-13: 250 [IU] via INTRAVENOUS
  Filled 2014-01-13: qty 5

## 2014-01-13 MED ORDER — SODIUM CHLORIDE 0.9 % IJ SOLN
10.0000 mL | INTRAMUSCULAR | Status: DC | PRN
  Administered 2014-01-13: 10 mL via INTRAVENOUS
  Filled 2014-01-13: qty 10

## 2014-01-13 NOTE — Patient Instructions (Signed)
PICC Home Guide A peripherally inserted central catheter (PICC) is a long, thin, flexible tube that is inserted into a vein in the upper arm. It is a form of intravenous (IV) access. It is considered to be a "central" line because the tip of the PICC ends in a large vein in your chest. This large vein is called the superior vena cava (SVC). The PICC tip ends in the SVC because there is a lot of blood flow in the SVC. This allows medicines and IV fluids to be quickly distributed throughout the body. The PICC is inserted using a sterile technique by a specially trained nurse or physician. After the PICC is inserted, a chest X-ray exam is done to be sure it is in the correct place.  A PICC may be placed for different reasons, such as:  To give medicines and liquid nutrition that can only be given through a central line. Examples are:  Certain antibiotic treatments.  Chemotherapy.  Total parenteral nutrition (TPN).  To take frequent blood samples.  To give IV fluids and blood products.  If there is difficulty placing a peripheral intravenous (PIV) catheter. If taken care of properly, a PICC can remain in place for several months. A PICC can also allow a person to go home from the hospital early. Medicine and PICC care can be managed at home by a family member or home health care team. WHAT PROBLEMS CAN HAPPEN WHEN I HAVE A PICC? Problems with a PICC can occasionally occur. These may include the following:  A blood clot (thrombus) forming in or at the tip of the PICC. This can cause the PICC to become clogged. A clot-dissolving medicine called tissue plasminogen activator (tPA) can be given through the PICC to help break up the clot.  Inflammation of the vein (phlebitis) in which the PICC is placed. Signs of inflammation may include redness, pain at the insertion site, red streaks, or being able to feel a "cord" in the vein where the PICC is located.  Infection in the PICC or at the insertion  site. Signs of infection may include fever, chills, redness, swelling, or pus drainage from the PICC insertion site.  PICC movement (malposition). The PICC tip may move from its original position due to excessive physical activity, forceful coughing, sneezing, or vomiting.  A break or cut in the PICC. It is important to not use scissors near the PICC.  Nerve or tendon irritation or injury during PICC insertion. WHAT SHOULD I KEEP IN MIND ABOUT ACTIVITIES WHEN I HAVE A PICC?  You may bend your arm and move it freely. If your PICC is near or at the bend of your elbow, avoid activity with repeated motion at the elbow.  Rest at home for the remainder of the day following PICC line insertion.  Avoid lifting heavy objects as instructed by your health care provider.  Avoid using a crutch with the arm on the same side as your PICC. You may need to use a walker. WHAT SHOULD I KNOW ABOUT MY PICC DRESSING?  Keep your PICC bandage (dressing) clean and dry to prevent infection.  Ask your health care provider when you may shower. Ask your health care provider to teach you how to wrap the PICC when you do take a shower.  Change the PICC dressing as instructed by your health care provider.  Change your PICC dressing if it becomes loose or wet. WHAT SHOULD I KNOW ABOUT PICC CARE?  Check the PICC insertion site   daily for leakage, redness, swelling, or pain.  Do not take a bath, swim, or use hot tubs when you have a PICC. Cover PICC line with clear plastic wrap and tape to keep it dry while showering.  Flush the PICC as directed by your health care provider. Let your health care provider know right away if the PICC is difficult to flush or does not flush. Do not use force to flush the PICC.  Do not use a syringe that is less than 10 mL to flush the PICC.  Never pull or tug on the PICC.  Avoid blood pressure checks on the arm with the PICC.  Keep your PICC identification card with you at all  times.  Do not take the PICC out yourself. Only a trained clinical professional should remove the PICC. SEEK IMMEDIATE MEDICAL CARE IF:  Your PICC is accidentally pulled all the way out. If this happens, cover the insertion site with a bandage or gauze dressing. Do not throw the PICC away. Your health care provider will need to inspect it.  Your PICC was tugged or pulled and has partially come out. Do not  push the PICC back in.  There is any type of drainage, redness, or swelling where the PICC enters the skin.  You cannot flush the PICC, it is difficult to flush, or the PICC leaks around the insertion site when it is flushed.  You hear a "flushing" sound when the PICC is flushed.  You have pain, discomfort, or numbness in your arm, shoulder, or jaw on the same side as the PICC.  You feel your heart "racing" or skipping beats.  You notice a hole or tear in the PICC.  You develop chills or a fever. MAKE SURE YOU:   Understand these instructions.  Will watch your condition.  Will get help right away if you are not doing well or get worse. Document Released: 10/14/2002 Document Revised: 08/24/2013 Document Reviewed: 12/15/2012 Exit[employer] Patient Information 2015 [employer]. This information is not intended to replace advice given to you by your health care provider. Make sure you discuss any questions you have with your health care provider.  

## 2014-01-16 ENCOUNTER — Other Ambulatory Visit: Payer: Self-pay | Admitting: Oncology

## 2014-01-16 DIAGNOSIS — C541 Malignant neoplasm of endometrium: Secondary | ICD-10-CM

## 2014-01-20 ENCOUNTER — Ambulatory Visit (HOSPITAL_BASED_OUTPATIENT_CLINIC_OR_DEPARTMENT_OTHER): Payer: BC Managed Care – PPO | Admitting: Oncology

## 2014-01-20 ENCOUNTER — Ambulatory Visit (HOSPITAL_BASED_OUTPATIENT_CLINIC_OR_DEPARTMENT_OTHER): Payer: BC Managed Care – PPO

## 2014-01-20 ENCOUNTER — Telehealth: Payer: Self-pay | Admitting: Oncology

## 2014-01-20 ENCOUNTER — Other Ambulatory Visit (HOSPITAL_BASED_OUTPATIENT_CLINIC_OR_DEPARTMENT_OTHER): Payer: BC Managed Care – PPO

## 2014-01-20 ENCOUNTER — Telehealth: Payer: Self-pay | Admitting: *Deleted

## 2014-01-20 ENCOUNTER — Encounter: Payer: Self-pay | Admitting: Oncology

## 2014-01-20 ENCOUNTER — Ambulatory Visit: Payer: BC Managed Care – PPO

## 2014-01-20 VITALS — BP 108/69 | HR 101 | Temp 97.8°F | Resp 20 | Ht 71.0 in | Wt >= 6400 oz

## 2014-01-20 DIAGNOSIS — D6959 Other secondary thrombocytopenia: Secondary | ICD-10-CM

## 2014-01-20 DIAGNOSIS — C541 Malignant neoplasm of endometrium: Secondary | ICD-10-CM

## 2014-01-20 DIAGNOSIS — C549 Malignant neoplasm of corpus uteri, unspecified: Secondary | ICD-10-CM

## 2014-01-20 DIAGNOSIS — G609 Hereditary and idiopathic neuropathy, unspecified: Secondary | ICD-10-CM

## 2014-01-20 DIAGNOSIS — K219 Gastro-esophageal reflux disease without esophagitis: Secondary | ICD-10-CM

## 2014-01-20 DIAGNOSIS — D6481 Anemia due to antineoplastic chemotherapy: Secondary | ICD-10-CM

## 2014-01-20 DIAGNOSIS — T451X5A Adverse effect of antineoplastic and immunosuppressive drugs, initial encounter: Secondary | ICD-10-CM

## 2014-01-20 DIAGNOSIS — Z452 Encounter for adjustment and management of vascular access device: Secondary | ICD-10-CM

## 2014-01-20 DIAGNOSIS — Z5111 Encounter for antineoplastic chemotherapy: Secondary | ICD-10-CM

## 2014-01-20 DIAGNOSIS — D638 Anemia in other chronic diseases classified elsewhere: Secondary | ICD-10-CM

## 2014-01-20 LAB — CBC WITH DIFFERENTIAL/PLATELET
BASO%: 0.8 % (ref 0.0–2.0)
Basophils Absolute: 0 10*3/uL (ref 0.0–0.1)
EOS%: 1.3 % (ref 0.0–7.0)
Eosinophils Absolute: 0.1 10*3/uL (ref 0.0–0.5)
HEMATOCRIT: 34.4 % — AB (ref 34.8–46.6)
HEMOGLOBIN: 11.4 g/dL — AB (ref 11.6–15.9)
LYMPH%: 28.4 % (ref 14.0–49.7)
MCH: 35.7 pg — AB (ref 25.1–34.0)
MCHC: 33.1 g/dL (ref 31.5–36.0)
MCV: 108 fL — AB (ref 79.5–101.0)
MONO#: 0.6 10*3/uL (ref 0.1–0.9)
MONO%: 11.2 % (ref 0.0–14.0)
NEUT#: 2.9 10*3/uL (ref 1.5–6.5)
NEUT%: 58.3 % (ref 38.4–76.8)
PLATELETS: 99 10*3/uL — AB (ref 145–400)
RBC: 3.18 10*6/uL — ABNORMAL LOW (ref 3.70–5.45)
RDW: 17.1 % — ABNORMAL HIGH (ref 11.2–14.5)
WBC: 5 10*3/uL (ref 3.9–10.3)
lymph#: 1.4 10*3/uL (ref 0.9–3.3)

## 2014-01-20 LAB — COMPREHENSIVE METABOLIC PANEL (CC13)
ALBUMIN: 3.1 g/dL — AB (ref 3.5–5.0)
ALK PHOS: 87 U/L (ref 40–150)
ALT: 25 U/L (ref 0–55)
AST: 23 U/L (ref 5–34)
Anion Gap: 9 mEq/L (ref 3–11)
BUN: 12.1 mg/dL (ref 7.0–26.0)
CALCIUM: 9.2 mg/dL (ref 8.4–10.4)
CHLORIDE: 103 meq/L (ref 98–109)
CO2: 29 mEq/L (ref 22–29)
Creatinine: 0.7 mg/dL (ref 0.6–1.1)
GLUCOSE: 188 mg/dL — AB (ref 70–140)
Potassium: 3.7 mEq/L (ref 3.5–5.1)
Sodium: 141 mEq/L (ref 136–145)
Total Bilirubin: 0.42 mg/dL (ref 0.20–1.20)
Total Protein: 6.6 g/dL (ref 6.4–8.3)

## 2014-01-20 MED ORDER — SODIUM CHLORIDE 0.9 % IV SOLN
150.0000 mg | Freq: Once | INTRAVENOUS | Status: AC
  Administered 2014-01-20: 150 mg via INTRAVENOUS
  Filled 2014-01-20: qty 5

## 2014-01-20 MED ORDER — SODIUM CHLORIDE 0.9 % IJ SOLN
10.0000 mL | INTRAMUSCULAR | Status: DC | PRN
  Administered 2014-01-20: 10 mL
  Filled 2014-01-20: qty 10

## 2014-01-20 MED ORDER — SODIUM CHLORIDE 0.9 % IV SOLN
Freq: Once | INTRAVENOUS | Status: AC
  Administered 2014-01-20: 13:00:00 via INTRAVENOUS

## 2014-01-20 MED ORDER — SODIUM CHLORIDE 0.9 % IJ SOLN
10.0000 mL | INTRAMUSCULAR | Status: DC | PRN
  Administered 2014-01-20: 10 mL via INTRAVENOUS
  Filled 2014-01-20: qty 10

## 2014-01-20 MED ORDER — ONDANSETRON 16 MG/50ML IVPB (CHCC)
INTRAVENOUS | Status: AC
  Filled 2014-01-20: qty 16

## 2014-01-20 MED ORDER — DEXAMETHASONE SODIUM PHOSPHATE 20 MG/5ML IJ SOLN
INTRAMUSCULAR | Status: AC
  Filled 2014-01-20: qty 5

## 2014-01-20 MED ORDER — SODIUM CHLORIDE 0.9 % IV SOLN
750.0000 mg | Freq: Once | INTRAVENOUS | Status: AC
  Administered 2014-01-20: 750 mg via INTRAVENOUS
  Filled 2014-01-20: qty 75

## 2014-01-20 MED ORDER — HEPARIN SOD (PORK) LOCK FLUSH 100 UNIT/ML IV SOLN
500.0000 [IU] | Freq: Once | INTRAVENOUS | Status: DC
Start: 2014-01-20 — End: 2014-01-20
  Filled 2014-01-20: qty 5

## 2014-01-20 MED ORDER — HEPARIN SOD (PORK) LOCK FLUSH 100 UNIT/ML IV SOLN
500.0000 [IU] | Freq: Once | INTRAVENOUS | Status: AC
  Administered 2014-01-20: 500 [IU] via INTRAVENOUS
  Filled 2014-01-20: qty 5

## 2014-01-20 MED ORDER — ALTEPLASE 2 MG IJ SOLR
2.0000 mg | Freq: Once | INTRAMUSCULAR | Status: DC | PRN
  Filled 2014-01-20: qty 2

## 2014-01-20 MED ORDER — DEXAMETHASONE SODIUM PHOSPHATE 20 MG/5ML IJ SOLN
12.0000 mg | Freq: Once | INTRAMUSCULAR | Status: AC
  Administered 2014-01-20: 12 mg via INTRAVENOUS

## 2014-01-20 MED ORDER — CARBOPLATIN CHEMO INTRADERMAL TEST DOSE 100MCG/0.02ML
100.0000 ug | Freq: Once | INTRADERMAL | Status: AC
  Administered 2014-01-20: 100 ug via INTRADERMAL
  Filled 2014-01-20: qty 0.01

## 2014-01-20 MED ORDER — PANTOPRAZOLE SODIUM 40 MG PO TBEC: DELAYED_RELEASE_TABLET | ORAL | Status: DC

## 2014-01-20 MED ORDER — PROMETHAZINE HCL 25 MG PO TABS: ORAL_TABLET | ORAL | Status: DC

## 2014-01-20 MED ORDER — ONDANSETRON 16 MG/50ML IVPB (CHCC)
16.0000 mg | Freq: Once | INTRAVENOUS | Status: AC
  Administered 2014-01-20: 16 mg via INTRAVENOUS

## 2014-01-20 NOTE — Telephone Encounter (Signed)
Per staff message and P[facility] I have scheduled appts. Advised scheduler of appts. JMW  

## 2014-01-20 NOTE — Progress Notes (Signed)
OFFICE PROGRESS NOTE   01/20/2014   Physicians:K.Khan, W.Brewster, Orpah Melter, M.Tsuei   INTERVAL HISTORY:  Patient is seen, together with mother and aunt, in continuing attention to chemotherapy with q 3 week carboplatin which is being used ongoing to control metastatic endometrial carcinoma. She has [place] skin testing done prior to each treatment. She continues to tolerate present regimen adequately, and wants to continue.  Patient has no new or different complaints today. Nausea and vomiting are mostly controlled even just after chemo with present antiemetics. Bowels are moving normally. She is able to walk short distances at home and was able to go out to her favorite restaurant again last weekend. The PICC is not uncomfortable, flushed by mother and dressing changes at this office. She has some back pain as she has had since vertebral fractures and kyphoplasty, better with occasional pain medication. She denies bleeding, on once daily arixtra for previous DVT.  She has PICC. She agrees to flu vaccine to be given with PICC dressing change next week, which she has not taken previously.       ONCOLOGIC HISTORY 1 Patient developed excessive uterine bleeding. Dilatation and curettage with hysteroscopy documented grade 1 endometrioid endometrial adenocarcinoma. Due to morbid obesity Mirena IUD was placed along with Aygestin. Patient had a dramatic directed weight loss with nutritional support she went from 523 pounds to 359 pounds in 10/2009. On 12/27/2009 she underwent a robotic-assisted laparoscopic hysterectomy, bilateral salpingo-oophorectomy, and mini laparotomy through the umbilical port with morcellation of uterus within about the delivery of the uterus. The final pathology revealed an endometrial adenocarcinoma grade 2 with invasion limited to 1 mm of the myometrium.  #2 Patient did well until 09/2012 when she developed right upper quadrant pain, presumed cholelithiasis. 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 Omental biopsies had metastatic adenocarcinoma consistent with recurrent endometrial carcinoma. She had 12 cycles taxol carboplatin from 11-04-12 thru 07-24-13 with neulasta day 2. CT AP after cycle 12 showed stable to minimally improved disease. With progressive neuropathy, plan is to continue single agent carboplatin for now.  #3 Abdominal ascites with multiple paracenteses.  #4 Back pain and subsequent MRI on 02/05/2013 revealed compression fractures of T10 and T11. Kyphoplasty 02/26/2013.  #6 Patient hospitalized and underwent more MRI's on 12/4 and L1 and L2 compression fractures were noted, after consultation with Dr. Sherwood Gambler, the patient was referred back to interventional radiology for evaluation for kyphoplasty and this was performed on 04/24/13.  #6. Right upper extremity DVT 04/13/13 related to [facility], which was removed. Now on once daily Arixtra  Review of systems as above, also: No fever or symptoms of infection. No increased SOB. No increased swelling LE. Remainder of 10 point Review of Systems negative.  Objective:  Vital signs in last 24 hours: Weight up to 418 lb, 108/69, HR 101 regular, resp 20 not labored RA, 97.8  Alert, oriented and appropriate. In [place]. Respirations not labored RA No alopecia  HEENT:PERRL, sclerae not icteric. Oral mucosa moist without lesions, posterior pharynx clear.  Neck supple. No JVD.  Lymphatics:no cervical,suraclavicular adenopathy Resp: clear to auscultation bilaterally and normal percussion bilaterally Cardio: regular rate and rhythm. No gallop. GI: abdomen morbidly obese, soft, nontender, not appreciably distended, cannot tell mass or organomegaly. Normally active bowel sounds.  Musculoskeletal/ Extremities: without pitting edema, cords, tenderness. Venous stasis changes especially left lower leg Neuro: no increased peripheral neuropathy.  Otherwise nonfocal. PSYCH appropriate mood and affect Skin without rash,  ecchymosis, petechiae PICC site without erythema or tenderness  Lab Results:  Results for orders placed in visit on 01/20/14  CBC WITH DIFFERENTIAL      Result Value Ref Range   WBC 5.0  3.9 - 10.3 10e3/uL   NEUT# 2.9  1.5 - 6.5 10e3/uL   HGB 11.4 (*) 11.6 - 15.9 g/dL   HCT 34.4 (*) 34.8 - 46.6 %   Platelets 99 (*) 145 - 400 10e3/uL   MCV 108.0 (*) 79.5 - 101.0 fL   MCH 35.7 (*) 25.1 - 34.0 pg   MCHC 33.1  31.5 - 36.0 g/dL   RBC 3.18 (*) 3.70 - 5.45 10e6/uL   RDW 17.1 (*) 11.2 - 14.5 %   lymph# 1.4  0.9 - 3.3 10e3/uL   MONO# 0.6  0.1 - 0.9 10e3/uL   Eosinophils Absolute 0.1  0.0 - 0.5 10e3/uL   Basophils Absolute 0.0  0.0 - 0.1 10e3/uL   NEUT% 58.3  38.4 - 76.8 %   LYMPH% 28.4  14.0 - 49.7 %   MONO% 11.2  0.0 - 14.0 %   EOS% 1.3  0.0 - 7.0 %   BASO% 0.8  0.0 - 2.0 %   CMET normal with exception of glu 188 and alb 3.1  Studies/Results:  No results found.  Medications: I have reviewed the patient's current medications.  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 Taxol held due to peripheral neuropathy. Per previous discussions with gyn oncology, this is basically maintenance as other treatment options extremely limited. Continue for now. 2.PICC in: flushed by mother, dressing changes at [facility] weekly  3.thrombocytopenia from extensive chemo: with platelet count just under 100k today will dose reduce this cycle carboplatin. 4.morbid obesity  5.long term anticoagulation since PAC-associated DVT in Dec 2014, now using single injection of arixtra daily. She has PICC and is very high risk for clots given metastatic gyn cancer, morbid obesity and extremely sedentary condition.  6. Multifactorial anemia: chemo, blood draws,  chronic disease. Tolerating oral iron well now.  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 Continuing chemo as single agent carboplatin now.  9. 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 12.for flu vaccine 01-27-14 13.previous C diff diarrhea, resolved.  Chemo orders confirmed. Patient and family comfortable with discussion and in agreement with plans.   Emmette Katt P, MD   01/20/2014, 10:25 AM

## 2014-01-20 NOTE — Telephone Encounter (Signed)
per pof to sch pt appt-gave mother copy of sch

## 2014-01-20 NOTE — Patient Instructions (Signed)
[facility] Discharge Instructions for Patients Receiving Chemotherapy  Today you received the following chemotherapy agents  Carboplatin  To help prevent nausea and vomiting after your treatment, we encourage you to take your nausea medication  Zofran, Phenergan, and Compazine as directed   If you develop nausea and vomiting that is not controlled by your nausea medication, call the clinic.   BELOW ARE SYMPTOMS THAT SHOULD BE REPORTED IMMEDIATELY:  *FEVER GREATER THAN 100.5 F  *CHILLS WITH OR WITHOUT FEVER  NAUSEA AND VOMITING THAT IS NOT CONTROLLED WITH YOUR NAUSEA MEDICATION  *UNUSUAL SHORTNESS OF BREATH  *UNUSUAL BRUISING OR BLEEDING  TENDERNESS IN MOUTH AND THROAT WITH OR WITHOUT PRESENCE OF ULCERS  *URINARY PROBLEMS  *BOWEL PROBLEMS  UNUSUAL RASH Items with * indicate a potential emergency and should be followed up as soon as possible.  Feel free to call the clinic you have any questions or concerns. The clinic phone number is [phone].

## 2014-01-21 ENCOUNTER — Other Ambulatory Visit: Payer: Self-pay

## 2014-01-21 ENCOUNTER — Ambulatory Visit (HOSPITAL_BASED_OUTPATIENT_CLINIC_OR_DEPARTMENT_OTHER): Payer: BC Managed Care – PPO

## 2014-01-21 VITALS — BP 103/55 | HR 80 | Temp 97.6°F

## 2014-01-21 DIAGNOSIS — Z08 Encounter for follow-up examination after completed treatment for malignant neoplasm: Secondary | ICD-10-CM

## 2014-01-21 DIAGNOSIS — C541 Malignant neoplasm of endometrium: Secondary | ICD-10-CM

## 2014-01-21 DIAGNOSIS — Z23 Encounter for immunization: Secondary | ICD-10-CM

## 2014-01-21 MED ORDER — PEGFILGRASTIM INJECTION 6 MG/0.6ML
6.0000 mg | Freq: Once | SUBCUTANEOUS | Status: AC
  Administered 2014-01-21: 6 mg via SUBCUTANEOUS
  Filled 2014-01-21: qty 0.6

## 2014-01-26 ENCOUNTER — Other Ambulatory Visit: Payer: Self-pay | Admitting: *Deleted

## 2014-01-26 DIAGNOSIS — C549 Malignant neoplasm of corpus uteri, unspecified: Secondary | ICD-10-CM

## 2014-01-26 MED ORDER — ONDANSETRON HCL 8 MG PO TABS: ORAL_TABLET | ORAL | Status: DC

## 2014-01-27 ENCOUNTER — Ambulatory Visit (HOSPITAL_BASED_OUTPATIENT_CLINIC_OR_DEPARTMENT_OTHER): Payer: BC Managed Care – PPO

## 2014-01-27 VITALS — BP 101/71 | HR 96 | Temp 97.9°F | Resp 20

## 2014-01-27 DIAGNOSIS — C541 Malignant neoplasm of endometrium: Secondary | ICD-10-CM

## 2014-01-27 DIAGNOSIS — Z23 Encounter for immunization: Secondary | ICD-10-CM

## 2014-01-27 MED ORDER — SODIUM CHLORIDE 0.9 % IJ SOLN
10.0000 mL | INTRAMUSCULAR | Status: DC | PRN
  Administered 2014-01-27: 10 mL via INTRAVENOUS
  Filled 2014-01-27: qty 10

## 2014-01-27 MED ORDER — HEPARIN SOD (PORK) LOCK FLUSH 100 UNIT/ML IV SOLN
500.0000 [IU] | Freq: Once | INTRAVENOUS | Status: AC
  Administered 2014-01-27: 500 [IU] via INTRAVENOUS
  Filled 2014-01-27: qty 5

## 2014-01-27 MED ORDER — INFLUENZA VAC SPLIT QUAD 0.5 ML IM SUSY
0.5000 mL | PREFILLED_SYRINGE | Freq: Once | INTRAMUSCULAR | Status: AC
  Administered 2014-01-27: 0.5 mL via INTRAMUSCULAR
  Filled 2014-01-27: qty 0.5

## 2014-01-27 NOTE — Patient Instructions (Signed)
PICC Home Guide A peripherally inserted central catheter (PICC) is a long, thin, flexible tube that is inserted into a vein in the upper arm. It is a form of intravenous (IV) access. It is considered to be a "central" line because the tip of the PICC ends in a large vein in your chest. This large vein is called the superior vena cava (SVC). The PICC tip ends in the SVC because there is a lot of blood flow in the SVC. This allows medicines and IV fluids to be quickly distributed throughout the body. The PICC is inserted using a sterile technique by a specially trained nurse or physician. After the PICC is inserted, a chest X-ray exam is done to be sure it is in the correct place.  A PICC may be placed for different reasons, such as:  To give medicines and liquid nutrition that can only be given through a central line. Examples are:  Certain antibiotic treatments.  Chemotherapy.  Total parenteral nutrition (TPN).  To take frequent blood samples.  To give IV fluids and blood products.  If there is difficulty placing a peripheral intravenous (PIV) catheter. If taken care of properly, a PICC can remain in place for several months. A PICC can also allow a person to go home from the hospital early. Medicine and PICC care can be managed at home by a family member or home health care team. WHAT PROBLEMS CAN HAPPEN WHEN I HAVE A PICC? Problems with a PICC can occasionally occur. These may include the following:  A blood clot (thrombus) forming in or at the tip of the PICC. This can cause the PICC to become clogged. A clot-dissolving medicine called tissue plasminogen activator (tPA) can be given through the PICC to help break up the clot.  Inflammation of the vein (phlebitis) in which the PICC is placed. Signs of inflammation may include redness, pain at the insertion site, red streaks, or being able to feel a "cord" in the vein where the PICC is located.  Infection in the PICC or at the insertion  site. Signs of infection may include fever, chills, redness, swelling, or pus drainage from the PICC insertion site.  PICC movement (malposition). The PICC tip may move from its original position due to excessive physical activity, forceful coughing, sneezing, or vomiting.  A break or cut in the PICC. It is important to not use scissors near the PICC.  Nerve or tendon irritation or injury during PICC insertion. WHAT SHOULD I KEEP IN MIND ABOUT ACTIVITIES WHEN I HAVE A PICC?  You may bend your arm and move it freely. If your PICC is near or at the bend of your elbow, avoid activity with repeated motion at the elbow.  Rest at home for the remainder of the day following PICC line insertion.  Avoid lifting heavy objects as instructed by your health care provider.  Avoid using a crutch with the arm on the same side as your PICC. You may need to use a walker. WHAT SHOULD I KNOW ABOUT MY PICC DRESSING?  Keep your PICC bandage (dressing) clean and dry to prevent infection.  Ask your health care provider when you may shower. Ask your health care provider to teach you how to wrap the PICC when you do take a shower.  Change the PICC dressing as instructed by your health care provider.  Change your PICC dressing if it becomes loose or wet. WHAT SHOULD I KNOW ABOUT PICC CARE?  Check the PICC insertion site   daily for leakage, redness, swelling, or pain.  Do not take a bath, swim, or use hot tubs when you have a PICC. Cover PICC line with clear plastic wrap and tape to keep it dry while showering.  Flush the PICC as directed by your health care provider. Let your health care provider know right away if the PICC is difficult to flush or does not flush. Do not use force to flush the PICC.  Do not use a syringe that is less than 10 mL to flush the PICC.  Never pull or tug on the PICC.  Avoid blood pressure checks on the arm with the PICC.  Keep your PICC identification card with you at all  times.  Do not take the PICC out yourself. Only a trained clinical professional should remove the PICC. SEEK IMMEDIATE MEDICAL CARE IF:  Your PICC is accidentally pulled all the way out. If this happens, cover the insertion site with a bandage or gauze dressing. Do not throw the PICC away. Your health care provider will need to inspect it.  Your PICC was tugged or pulled and has partially come out. Do not  push the PICC back in.  There is any type of drainage, redness, or swelling where the PICC enters the skin.  You cannot flush the PICC, it is difficult to flush, or the PICC leaks around the insertion site when it is flushed.  You hear a "flushing" sound when the PICC is flushed.  You have pain, discomfort, or numbness in your arm, shoulder, or jaw on the same side as the PICC.  You feel your heart "racing" or skipping beats.  You notice a hole or tear in the PICC.  You develop chills or a fever. MAKE SURE YOU:   Understand these instructions.  Will watch your condition.  Will get help right away if you are not doing well or get worse. Document Released: 10/14/2002 Document Revised: 08/24/2013 Document Reviewed: 12/15/2012 Exit[employer] Patient Information 2015 [employer]. This information is not intended to replace advice given to you by your health care provider. Make sure you discuss any questions you have with your health care provider.  

## 2014-01-29 ENCOUNTER — Other Ambulatory Visit: Payer: Self-pay | Admitting: *Deleted

## 2014-01-29 DIAGNOSIS — C549 Malignant neoplasm of corpus uteri, unspecified: Secondary | ICD-10-CM

## 2014-01-29 MED ORDER — ONDANSETRON HCL 8 MG PO TABS: ORAL_TABLET | ORAL | Status: AC

## 2014-02-03 ENCOUNTER — Ambulatory Visit (HOSPITAL_BASED_OUTPATIENT_CLINIC_OR_DEPARTMENT_OTHER): Payer: BC Managed Care – PPO

## 2014-02-03 DIAGNOSIS — C541 Malignant neoplasm of endometrium: Secondary | ICD-10-CM

## 2014-02-03 DIAGNOSIS — Z452 Encounter for adjustment and management of vascular access device: Secondary | ICD-10-CM

## 2014-02-03 MED ORDER — HEPARIN SOD (PORK) LOCK FLUSH 100 UNIT/ML IV SOLN
500.0000 [IU] | Freq: Once | INTRAVENOUS | Status: AC
  Administered 2014-02-03: 500 [IU] via INTRAVENOUS
  Filled 2014-02-03: qty 5

## 2014-02-03 MED ORDER — SODIUM CHLORIDE 0.9 % IJ SOLN
10.0000 mL | INTRAMUSCULAR | Status: DC | PRN
  Administered 2014-02-03: 10 mL via INTRAVENOUS
  Filled 2014-02-03: qty 10

## 2014-02-03 NOTE — Patient Instructions (Signed)

## 2014-02-09 ENCOUNTER — Other Ambulatory Visit: Payer: Self-pay | Admitting: Oncology

## 2014-02-10 ENCOUNTER — Other Ambulatory Visit: Payer: BC Managed Care – PPO

## 2014-02-10 ENCOUNTER — Ambulatory Visit (HOSPITAL_BASED_OUTPATIENT_CLINIC_OR_DEPARTMENT_OTHER): Payer: BC Managed Care – PPO

## 2014-02-10 ENCOUNTER — Encounter: Payer: Self-pay | Admitting: Oncology

## 2014-02-10 ENCOUNTER — Telehealth: Payer: Self-pay | Admitting: *Deleted

## 2014-02-10 ENCOUNTER — Other Ambulatory Visit (HOSPITAL_BASED_OUTPATIENT_CLINIC_OR_DEPARTMENT_OTHER): Payer: BC Managed Care – PPO

## 2014-02-10 ENCOUNTER — Telehealth: Payer: Self-pay | Admitting: Oncology

## 2014-02-10 ENCOUNTER — Ambulatory Visit (HOSPITAL_BASED_OUTPATIENT_CLINIC_OR_DEPARTMENT_OTHER): Payer: BC Managed Care – PPO | Admitting: Oncology

## 2014-02-10 VITALS — BP 103/65 | HR 97 | Temp 98.2°F | Resp 18 | Ht 71.0 in | Wt >= 6400 oz

## 2014-02-10 DIAGNOSIS — C541 Malignant neoplasm of endometrium: Secondary | ICD-10-CM

## 2014-02-10 DIAGNOSIS — Z86718 Personal history of other venous thrombosis and embolism: Secondary | ICD-10-CM

## 2014-02-10 DIAGNOSIS — D6481 Anemia due to antineoplastic chemotherapy: Secondary | ICD-10-CM

## 2014-02-10 DIAGNOSIS — B3789 Other sites of candidiasis: Secondary | ICD-10-CM

## 2014-02-10 DIAGNOSIS — Z5111 Encounter for antineoplastic chemotherapy: Secondary | ICD-10-CM

## 2014-02-10 DIAGNOSIS — T451X5A Adverse effect of antineoplastic and immunosuppressive drugs, initial encounter: Secondary | ICD-10-CM

## 2014-02-10 DIAGNOSIS — G620 Drug-induced polyneuropathy: Secondary | ICD-10-CM

## 2014-02-10 DIAGNOSIS — Z452 Encounter for adjustment and management of vascular access device: Secondary | ICD-10-CM

## 2014-02-10 LAB — CBC WITH DIFFERENTIAL/PLATELET
BASO%: 0.2 % (ref 0.0–2.0)
Basophils Absolute: 0 10*3/uL (ref 0.0–0.1)
EOS%: 1.3 % (ref 0.0–7.0)
Eosinophils Absolute: 0.1 10*3/uL (ref 0.0–0.5)
HCT: 37.5 % (ref 34.8–46.6)
HGB: 12.3 g/dL (ref 11.6–15.9)
LYMPH%: 25.2 % (ref 14.0–49.7)
MCH: 35.2 pg — ABNORMAL HIGH (ref 25.1–34.0)
MCHC: 32.8 g/dL (ref 31.5–36.0)
MCV: 107.4 fL — ABNORMAL HIGH (ref 79.5–101.0)
MONO#: 0.5 10*3/uL (ref 0.1–0.9)
MONO%: 8.8 % (ref 0.0–14.0)
NEUT#: 3.9 10*3/uL (ref 1.5–6.5)
NEUT%: 64.5 % (ref 38.4–76.8)
PLATELETS: 129 10*3/uL — AB (ref 145–400)
RBC: 3.49 10*6/uL — AB (ref 3.70–5.45)
RDW: 15.1 % — ABNORMAL HIGH (ref 11.2–14.5)
WBC: 6 10*3/uL (ref 3.9–10.3)
lymph#: 1.5 10*3/uL (ref 0.9–3.3)

## 2014-02-10 LAB — COMPREHENSIVE METABOLIC PANEL
ALBUMIN: 3.5 g/dL (ref 3.5–5.2)
ALK PHOS: 91 U/L (ref 39–117)
ALT: 28 U/L (ref 0–35)
AST: 23 U/L (ref 0–37)
BUN: 12 mg/dL (ref 6–23)
CHLORIDE: 103 meq/L (ref 96–112)
CO2: 29 mEq/L (ref 19–32)
Calcium: 9.6 mg/dL (ref 8.4–10.5)
Creatinine, Ser: 0.71 mg/dL (ref 0.50–1.10)
Glucose, Bld: 121 mg/dL — ABNORMAL HIGH (ref 70–99)
POTASSIUM: 3.9 meq/L (ref 3.5–5.3)
Sodium: 147 mEq/L — ABNORMAL HIGH (ref 135–145)
TOTAL PROTEIN: 7.3 g/dL (ref 6.0–8.3)
Total Bilirubin: 0.4 mg/dL (ref 0.3–1.2)

## 2014-02-10 MED ORDER — SODIUM CHLORIDE 0.9 % IV SOLN
150.0000 mg | Freq: Once | INTRAVENOUS | Status: AC
  Administered 2014-02-10: 150 mg via INTRAVENOUS
  Filled 2014-02-10: qty 5

## 2014-02-10 MED ORDER — CARBOPLATIN CHEMO INTRADERMAL TEST DOSE 100MCG/0.02ML
100.0000 ug | Freq: Once | INTRADERMAL | Status: AC
  Administered 2014-02-10: 100 ug via INTRADERMAL
  Filled 2014-02-10: qty 0.01

## 2014-02-10 MED ORDER — DIPHENHYDRAMINE HCL 25 MG PO CAPS
25.0000 mg | ORAL_CAPSULE | Freq: Once | ORAL | Status: AC
  Administered 2014-02-10: 25 mg via ORAL

## 2014-02-10 MED ORDER — HEPARIN SOD (PORK) LOCK FLUSH 100 UNIT/ML IV SOLN
500.0000 [IU] | Freq: Once | INTRAVENOUS | Status: AC
  Administered 2014-02-10: 500 [IU] via INTRAVENOUS
  Filled 2014-02-10: qty 5

## 2014-02-10 MED ORDER — SODIUM CHLORIDE 0.9 % IJ SOLN
10.0000 mL | INTRAMUSCULAR | Status: DC | PRN
  Filled 2014-02-10: qty 10

## 2014-02-10 MED ORDER — ONDANSETRON 16 MG/50ML IVPB (CHCC)
16.0000 mg | Freq: Once | INTRAVENOUS | Status: AC
  Administered 2014-02-10: 16 mg via INTRAVENOUS

## 2014-02-10 MED ORDER — PALONOSETRON HCL INJECTION 0.25 MG/5ML
INTRAVENOUS | Status: AC
  Filled 2014-02-10: qty 5

## 2014-02-10 MED ORDER — MICONAZOLE NITRATE POWD: 1.0000 "application " | Freq: Four times a day (QID) | Status: DC

## 2014-02-10 MED ORDER — DIPHENHYDRAMINE HCL 25 MG PO CAPS
ORAL_CAPSULE | ORAL | Status: AC
  Filled 2014-02-10: qty 1

## 2014-02-10 MED ORDER — DEXAMETHASONE SODIUM PHOSPHATE 20 MG/5ML IJ SOLN
12.0000 mg | Freq: Once | INTRAMUSCULAR | Status: AC
  Administered 2014-02-10: 12 mg via INTRAVENOUS

## 2014-02-10 MED ORDER — HEPARIN SOD (PORK) LOCK FLUSH 100 UNIT/ML IV SOLN
250.0000 [IU] | Freq: Once | INTRAVENOUS | Status: DC | PRN
  Filled 2014-02-10: qty 5

## 2014-02-10 MED ORDER — DEXAMETHASONE SODIUM PHOSPHATE 20 MG/5ML IJ SOLN
INTRAMUSCULAR | Status: AC
  Filled 2014-02-10: qty 5

## 2014-02-10 MED ORDER — ONDANSETRON 16 MG/50ML IVPB (CHCC)
INTRAVENOUS | Status: AC
  Filled 2014-02-10: qty 16

## 2014-02-10 MED ORDER — SODIUM CHLORIDE 0.9 % IV SOLN
Freq: Once | INTRAVENOUS | Status: AC
  Administered 2014-02-10: 11:00:00 via INTRAVENOUS

## 2014-02-10 MED ORDER — SODIUM CHLORIDE 0.9 % IJ SOLN
10.0000 mL | INTRAMUSCULAR | Status: DC | PRN
  Administered 2014-02-10: 10 mL via INTRAVENOUS
  Filled 2014-02-10: qty 10

## 2014-02-10 MED ORDER — CARBOPLATIN CHEMO INJECTION 600 MG/60ML
750.0000 mg | Freq: Once | INTRAVENOUS | Status: AC
  Administered 2014-02-10: 750 mg via INTRAVENOUS
  Filled 2014-02-10: qty 75

## 2014-02-10 NOTE — Progress Notes (Signed)
OFFICE PROGRESS NOTE   02/10/2014   Physicians:K.Khan, W.Brewster, Orpah Melter, M.Tsuei   INTERVAL HISTORY:  Patient is seen, together with mother and aunt, in continuing attention to metastatic endometrial carcinoma for which she continues q 3 week carboplatin. She is clinically doing well on present regimen, does have carbo skin test prior to each treatment and neulasta day after chemo. Last CT CAP was 08-03-13; I believe that she saw Dr Skeet Latch last 03-2013. She is due cycle 9 single agent carboplatin today.  Patient had some nausea without vomiting or dry heaves for ~ 2 days after last chemo, but was able to eat potatoes and chicken soup then. She felt well enough to go out to eat the following day. She is using zofran and phenergan regularly for ~ a week after chemo with prn compazine, then just prn compazine. Bowels are moving. She denies new or different abdominal or pelvic discomfort. She continues Arixtra daily for RUE DVT 03-2013 related to PAC,  no bleeding. Swelling in LE is improved.    She has PICC.  She had flu vaccine 01-27-14, tolerated well.   ONCOLOGIC HISTORY Patient developed excessive uterine bleeding. Dilatation and curettage with hysteroscopy documented grade 1 endometrioid endometrial adenocarcinoma. Due to morbid obesity Mirena IUD was placed along with Aygestin. Patient had a dramatic directed weight loss with nutritional support she went from 523 pounds to 359 pounds in 10/2009. On 12/27/2009 she underwent a robotic-assisted laparoscopic hysterectomy, bilateral salpingo-oophorectomy, and mini laparotomy through the umbilical port with morcellation of uterus within about the delivery of the uterus. The final pathology revealed an endometrial adenocarcinoma grade 2 with invasion limited to 1 mm of the myometrium.   In 09/2012 when she developed right upper quadrant pain, presumed cholelithiasis, with laparoscopic evaluation finding peritoneal carcinomatosis and ascites.  Omental biopsies had metastatic adenocarcinoma consistent with recurrent endometrial carcinoma. She had 12 cycles taxol carboplatin from 11-04-12 thru 07-24-13 with neulasta day 2. CT AP after cycle 12 showed stable to minimally improved disease. With progressive neuropathy, she has continued single agent carboplatin, cycle 9 on 02-11-14. She initially required multiple paracenteses for ascites, none in months now.  Course was complicated by compression fractures of T10 and T11 in 01-2013, with kyphoplasty 02/26/2013 and 04-2013. She had right upper extremity DVT 04/13/13 related to [facility], which was removed. Now on once daily Arixtra.  Review of systems as above, also: No fever or symptoms of infection. No problems with PICC. No increased SOB.  Remainder of 10 point Review of Systems negative.  Objective:  Vital signs in last 24 hours:  BP 103/65  Pulse 97  Temp(Src) 98.2 F (36.8 C) (Oral)  Resp 18  Ht 5\' 11"  (1.803 m)  Wt 423 lb 6.4 oz (192.053 kg)  BMI 59.08 kg/m2 weight up 5 lbs. Morbidly obese.  Alert, oriented and appropriate. In WC, respirations not labored RA. Hair is growing back  HEENT:PERRL, sclerae not icteric. Oral mucosa moist without lesions, posterior pharynx clear.  Neck supple. No JVD.  Lymphatics:no cervical,suraclavicular adenopathy Resp: clear to auscultation bilaterally, diminished BS lower fields consistent with size. Cardio: regular rate and rhythm. No gallop. GI: abdomen obese, soft, nontender, not distended, cannot appreciate mass or organomegaly. Normally active bowel sounds. Scattered SQ firm areas from LMW heparin injections, none tender. Musculoskeletal/ Extremities: without pitting edema, cords, tenderness. Venous stasis discoloration LE bilaterally.  Neuro: no change peripheral neuropathy. Otherwise nonfocal on exam in [facility] Skin without rash, ecchymosis, petechiae PICC-without erythema or tenderness  Lab Results:  Results for orders placed in visit on  02/10/14  CBC WITH DIFFERENTIAL      Result Value Ref Range   WBC 6.0  3.9 - 10.3 10e3/uL   NEUT# 3.9  1.5 - 6.5 10e3/uL   HGB 12.3  11.6 - 15.9 g/dL   HCT 37.5  34.8 - 46.6 %   Platelets 129 (*) 145 - 400 10e3/uL   MCV 107.4 (*) 79.5 - 101.0 fL   MCH 35.2 (*) 25.1 - 34.0 pg   MCHC 32.8  31.5 - 36.0 g/dL   RBC 3.49 (*) 3.70 - 5.45 10e6/uL   RDW 15.1 (*) 11.2 - 14.5 %   lymph# 1.5  0.9 - 3.3 10e3/uL   MONO# 0.5  0.1 - 0.9 10e3/uL   Eosinophils Absolute 0.1  0.0 - 0.5 10e3/uL   Basophils Absolute 0.0  0.0 - 0.1 10e3/uL   NEUT% 64.5  38.4 - 76.8 %   LYMPH% 25.2  14.0 - 49.7 %   MONO% 8.8  0.0 - 14.0 %   EOS% 1.3  0.0 - 7.0 %   BASO% 0.2  0.0 - 2.0 %    CMET available prior to treatment today with Na 147, glu 121, alb 3.1  Otherwise WNL  Studies/Results:  No results found.  Medications: I have reviewed the patient's current medications.  DISCUSSION: prior to treatment we discussed present chemo regimen and fact that at some point this will likely need to change, either due to progression of disease or intolerance including allergic reactions. As she is clinically doing so well, she and family are in full agreement with continuing until some other concerns, at which time we will repeat scans and may ask gyn onc to review. Following visit, she had negative skin test and completed carbo infusion without problems, then shortly after completion developed puritic rash on back of neck and erythema of palms. She was given additional benadryl po and remained stable for DC home.   Assessment/Plan:  1.metastatic endometrial carcinoma with mostly pelvic involvement by last imaging 6 months ago, clinically stable. Symptoms just after [place] today suggest allergy reaction. Will discuss further, but likely appropriate to rescan in ~ 3-4 weeks to see if change in treatment or close observation appropriate now. She will have neulasta 10-22 as planned. 2.PICC in: flushed by mother, dressing changes at  [facility] weekly. If she goes onto close observation, could remove PICC.  3.C diff diarrhea resolved after second couse of flagyl in June 2015  4.morbid obesity  5.long term anticoagulation since PAC-associated DVT in Dec 2014, now using single injection of arixtra daily  6. Multifactorial anemia: chemo, blood draws, chronic disease. Tolerating oral iron. 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 Probable cholelithiasis by scans  10. Degenerative arthritis T and L spine.  11. T spine compression fractures T10 and T11 Oct 2014 after fall, post kyphoplasties. improved   All questions answered at time of visit. Discussed with RN directly after pruritic rash following infusion. Time spent 25 min including >50% counseling and coordination of care.  Cc this note Dr Ronal Fear, MD   02/10/2014, 9:50 AM

## 2014-02-10 NOTE — Patient Instructions (Signed)
[facility] Discharge Instructions for Patients Receiving Chemotherapy  Today you received the following chemotherapy agents  Carboplatin  To help prevent nausea and vomiting after your treatment, we encourage you to take your nausea medication  Zofran, Phenergan, and Compazine as directed   If you develop nausea and vomiting that is not controlled by your nausea medication, call the clinic.   BELOW ARE SYMPTOMS THAT SHOULD BE REPORTED IMMEDIATELY:  *FEVER GREATER THAN 100.5 F  *CHILLS WITH OR WITHOUT FEVER  NAUSEA AND VOMITING THAT IS NOT CONTROLLED WITH YOUR NAUSEA MEDICATION  *UNUSUAL SHORTNESS OF BREATH  *UNUSUAL BRUISING OR BLEEDING  TENDERNESS IN MOUTH AND THROAT WITH OR WITHOUT PRESENCE OF ULCERS  *URINARY PROBLEMS  *BOWEL PROBLEMS  UNUSUAL RASH Items with * indicate a potential emergency and should be followed up as soon as possible.  Feel free to call the clinic you have any questions or concerns. The clinic phone number is [phone].

## 2014-02-10 NOTE — Patient Instructions (Signed)
PICC Home Guide A peripherally inserted central catheter (PICC) is a long, thin, flexible tube that is inserted into a vein in the upper arm. It is a form of intravenous (IV) access. It is considered to be a "central" line because the tip of the PICC ends in a large vein in your chest. This large vein is called the superior vena cava (SVC). The PICC tip ends in the SVC because there is a lot of blood flow in the SVC. This allows medicines and IV fluids to be quickly distributed throughout the body. The PICC is inserted using a sterile technique by a specially trained nurse or physician. After the PICC is inserted, a chest X-ray exam is done to be sure it is in the correct place.  A PICC may be placed for different reasons, such as:  To give medicines and liquid nutrition that can only be given through a central line. Examples are:  Certain antibiotic treatments.  Chemotherapy.  Total parenteral nutrition (TPN).  To take frequent blood samples.  To give IV fluids and blood products.  If there is difficulty placing a peripheral intravenous (PIV) catheter. If taken care of properly, a PICC can remain in place for several months. A PICC can also allow a person to go home from the hospital early. Medicine and PICC care can be managed at home by a family member or home health care team. WHAT PROBLEMS CAN HAPPEN WHEN I HAVE A PICC? Problems with a PICC can occasionally occur. These may include the following:  A blood clot (thrombus) forming in or at the tip of the PICC. This can cause the PICC to become clogged. A clot-dissolving medicine called tissue plasminogen activator (tPA) can be given through the PICC to help break up the clot.  Inflammation of the vein (phlebitis) in which the PICC is placed. Signs of inflammation may include redness, pain at the insertion site, red streaks, or being able to feel a "cord" in the vein where the PICC is located.  Infection in the PICC or at the insertion  site. Signs of infection may include fever, chills, redness, swelling, or pus drainage from the PICC insertion site.  PICC movement (malposition). The PICC tip may move from its original position due to excessive physical activity, forceful coughing, sneezing, or vomiting.  A break or cut in the PICC. It is important to not use scissors near the PICC.  Nerve or tendon irritation or injury during PICC insertion. WHAT SHOULD I KEEP IN MIND ABOUT ACTIVITIES WHEN I HAVE A PICC?  You may bend your arm and move it freely. If your PICC is near or at the bend of your elbow, avoid activity with repeated motion at the elbow.  Rest at home for the remainder of the day following PICC line insertion.  Avoid lifting heavy objects as instructed by your health care provider.  Avoid using a crutch with the arm on the same side as your PICC. You may need to use a walker. WHAT SHOULD I KNOW ABOUT MY PICC DRESSING?  Keep your PICC bandage (dressing) clean and dry to prevent infection.  Ask your health care provider when you may shower. Ask your health care provider to teach you how to wrap the PICC when you do take a shower.  Change the PICC dressing as instructed by your health care provider.  Change your PICC dressing if it becomes loose or wet. WHAT SHOULD I KNOW ABOUT PICC CARE?  Check the PICC insertion site   daily for leakage, redness, swelling, or pain.  Do not take a bath, swim, or use hot tubs when you have a PICC. Cover PICC line with clear plastic wrap and tape to keep it dry while showering.  Flush the PICC as directed by your health care provider. Let your health care provider know right away if the PICC is difficult to flush or does not flush. Do not use force to flush the PICC.  Do not use a syringe that is less than 10 mL to flush the PICC.  Never pull or tug on the PICC.  Avoid blood pressure checks on the arm with the PICC.  Keep your PICC identification card with you at all  times.  Do not take the PICC out yourself. Only a trained clinical professional should remove the PICC. SEEK IMMEDIATE MEDICAL CARE IF:  Your PICC is accidentally pulled all the way out. If this happens, cover the insertion site with a bandage or gauze dressing. Do not throw the PICC away. Your health care provider will need to inspect it.  Your PICC was tugged or pulled and has partially come out. Do not  push the PICC back in.  There is any type of drainage, redness, or swelling where the PICC enters the skin.  You cannot flush the PICC, it is difficult to flush, or the PICC leaks around the insertion site when it is flushed.  You hear a "flushing" sound when the PICC is flushed.  You have pain, discomfort, or numbness in your arm, shoulder, or jaw on the same side as the PICC.  You feel your heart "racing" or skipping beats.  You notice a hole or tear in the PICC.  You develop chills or a fever. MAKE SURE YOU:   Understand these instructions.  Will watch your condition.  Will get help right away if you are not doing well or get worse. Document Released: 10/14/2002 Document Revised: 08/24/2013 Document Reviewed: 12/15/2012 Exit[employer] Patient Information 2015 [employer]. This information is not intended to replace advice given to you by your health care provider. Make sure you discuss any questions you have with your health care provider.  

## 2014-02-10 NOTE — Progress Notes (Signed)
At appox 1455, ten minutes after Carboplatin treatment, Pt developed slight pinkness, in upper back and hands area. C/o itching, no breathing difficulties, VSS. Denies chest pain or difficulty breathing. Dr. Marko Plume notified, order given for Benadryl 25 mg  By mouth. Medication given. Pt instructed   To call center if itching or any breathing issues occur.  Pt verbalized understanding

## 2014-02-10 NOTE — Telephone Encounter (Signed)
Per staff message and P[facility] I have scheduled appts. Advised scheduler of appts. JMW  

## 2014-02-10 NOTE — Progress Notes (Signed)
Patient in for PICC dressing change and labs today. PICC accessed and flushed with 10 mL of Normal Saline. Small amount of blood return noted, 5 mL of blood wasted. PICC flushed with 2.5 mL of Heparin. Patient denies any pain or discomfort during and after flush. PICC dressing changed using sterile technique. All labs drawn by Phlebotomist today.

## 2014-02-11 ENCOUNTER — Telehealth: Payer: Self-pay

## 2014-02-11 ENCOUNTER — Ambulatory Visit (HOSPITAL_BASED_OUTPATIENT_CLINIC_OR_DEPARTMENT_OTHER): Payer: BC Managed Care – PPO

## 2014-02-11 ENCOUNTER — Other Ambulatory Visit: Payer: Self-pay | Admitting: Oncology

## 2014-02-11 VITALS — BP 101/56 | HR 81 | Temp 97.5°F

## 2014-02-11 DIAGNOSIS — T451X5A Adverse effect of antineoplastic and immunosuppressive drugs, initial encounter: Secondary | ICD-10-CM

## 2014-02-11 DIAGNOSIS — C541 Malignant neoplasm of endometrium: Secondary | ICD-10-CM

## 2014-02-11 DIAGNOSIS — G620 Drug-induced polyneuropathy: Secondary | ICD-10-CM | POA: Insufficient documentation

## 2014-02-11 DIAGNOSIS — D6481 Anemia due to antineoplastic chemotherapy: Secondary | ICD-10-CM | POA: Insufficient documentation

## 2014-02-11 DIAGNOSIS — Z5189 Encounter for other specified aftercare: Secondary | ICD-10-CM

## 2014-02-11 MED ORDER — PEGFILGRASTIM INJECTION 6 MG/0.6ML
6.0000 mg | Freq: Once | SUBCUTANEOUS | Status: AC
  Administered 2014-02-11: 6 mg via SUBCUTANEOUS
  Filled 2014-02-11: qty 0.6

## 2014-02-11 NOTE — Telephone Encounter (Signed)
Spoke with [name] and she is doing fine from the reaction.  The pinkness on her skin was almost gone by the time she got home yesterday. No sign of skin color change today or itching. Discussed recommended plan as noted below by Dr. Marko Plume.  Pt is in full agreement with Dr. Mariana Kaufman plan as she is the expert [name] said. POF to scheduling and Ct ordered in Epic.

## 2014-02-11 NOTE — Patient Instructions (Signed)
Pegfilgrastim injection What is this medicine? PEGFILGRASTIM (peg fil GRA stim) is a long-acting granulocyte colony-stimulating factor that stimulates the growth of neutrophils, a type of white blood cell important in the body's fight against infection. It is used to reduce the incidence of fever and infection in patients with certain types of cancer who are receiving chemotherapy that affects the bone marrow. This medicine may be used for other purposes; ask your health care provider or pharmacist if you have questions. COMMON BRAND NAME(S): Neulasta What should I tell my health care provider before I take this medicine? They need to know if you have any of these conditions: -latex allergy -ongoing radiation therapy -sickle cell disease -skin reactions to acrylic adhesives (On-Body Injector only) -an unusual or allergic reaction to pegfilgrastim, filgrastim, other medicines, foods, dyes, or preservatives -pregnant or trying to get pregnant -breast-feeding How should I use this medicine? This medicine is for injection under the skin. If you get this medicine at home, you will be taught how to prepare and give the pre-filled syringe or how to use the On-body Injector. Refer to the patient Instructions for Use for detailed instructions. Use exactly as directed. Take your medicine at regular intervals. Do not take your medicine more often than directed. It is important that you put your used needles and syringes in a special sharps container. Do not put them in a trash can. If you do not have a sharps container, call your pharmacist or healthcare provider to get one. Talk to your pediatrician regarding the use of this medicine in children. Special care may be needed. Overdosage: If you think you have taken too much of this medicine contact a poison control center or emergency room at once. NOTE: This medicine is only for you. Do not share this medicine with others. What if I miss a dose? It is  important not to miss your dose. Call your doctor or health care professional if you miss your dose. If you miss a dose due to an On-body Injector failure or leakage, a new dose should be administered as soon as possible using a single prefilled syringe for manual use. What may interact with this medicine? Interactions have not been studied. Give your health care provider a list of all the medicines, herbs, non-prescription drugs, or dietary supplements you use. Also tell them if you smoke, drink alcohol, or use illegal drugs. Some items may interact with your medicine. This list may not describe all possible interactions. Give your health care provider a list of all the medicines, herbs, non-prescription drugs, or dietary supplements you use. Also tell them if you smoke, drink alcohol, or use illegal drugs. Some items may interact with your medicine. What should I watch for while using this medicine? You may need blood work done while you are taking this medicine. If you are going to need a MRI, CT scan, or other procedure, tell your doctor that you are using this medicine (On-Body Injector only). What side effects may I notice from receiving this medicine? Side effects that you should report to your doctor or health care professional as soon as possible: -allergic reactions like skin rash, itching or hives, swelling of the face, lips, or tongue -dizziness -fever -pain, redness, or irritation at site where injected -pinpoint red spots on the skin -shortness of breath or breathing problems -stomach or side pain, or pain at the shoulder -swelling -tiredness -trouble passing urine Side effects that usually do not require medical attention (report to your doctor   or health care professional if they continue or are bothersome): -bone pain -muscle pain This list may not describe all possible side effects. Call your doctor for medical advice about side effects. You may report side effects to FDA at  1-800-FDA-1088. Where should I keep my medicine? Keep out of the reach of children. Store pre-filled syringes in a refrigerator between 2 and 8 degrees C (36 and 46 degrees F). Do not freeze. Keep in carton to protect from light. Throw away this medicine if it is left out of the refrigerator for more than 48 hours. Throw away any unused medicine after the expiration date. NOTE: This sheet is a summary. It may not cover all possible information. If you have questions about this medicine, talk to your doctor, pharmacist, or health care provider.  2015, [employer]vier/[employer] Standard. (2013-07-09 16:14:05)  

## 2014-02-11 NOTE — Telephone Encounter (Signed)
Message copied by Baruch Merl on Thu Feb 11, 2014  5:50 PM ------      Message from: Evlyn Clines P      Created: Thu Feb 11, 2014 11:53 AM       Seems to have had slight reaction to [place] after infusion on [id number], which is certainly not unexpected given the amount of [place] she has had - but I do not want to push this and have further allergy reaction problems.      Please check on her by phone (or she is for neulasta today). I recommend that we repeat CT scans, then we can decide about a different chemo or possibly a chemo break with close observation depending on those results. Tell her that I have sent Dr Skeet Latch a message, as she may want to see Adalina or may want to talk with me about her after the scans.            If patient agrees, need to order CT AP with contrast in ~ 3-4 weeks. Can move my appointment to a few days after the CT. ([facility] CT scanner can hold 440 lbs, so should be able to do it there)            I have cancelled next planned chemo. I can put in orders for CT/ my next apt/ cbc cmet if needed, but not doing it until we tell patient first            thanks ------

## 2014-02-12 ENCOUNTER — Telehealth: Payer: Self-pay | Admitting: Oncology

## 2014-02-17 ENCOUNTER — Ambulatory Visit (HOSPITAL_BASED_OUTPATIENT_CLINIC_OR_DEPARTMENT_OTHER): Payer: BC Managed Care – PPO

## 2014-02-17 DIAGNOSIS — C541 Malignant neoplasm of endometrium: Secondary | ICD-10-CM

## 2014-02-17 DIAGNOSIS — Z452 Encounter for adjustment and management of vascular access device: Secondary | ICD-10-CM

## 2014-02-17 MED ORDER — HEPARIN SOD (PORK) LOCK FLUSH 100 UNIT/ML IV SOLN
500.0000 [IU] | Freq: Once | INTRAVENOUS | Status: AC
  Administered 2014-02-17: 250 [IU] via INTRAVENOUS
  Filled 2014-02-17: qty 5

## 2014-02-17 MED ORDER — SODIUM CHLORIDE 0.9 % IJ SOLN
10.0000 mL | INTRAMUSCULAR | Status: DC | PRN
  Administered 2014-02-17: 10 mL via INTRAVENOUS
  Filled 2014-02-17: qty 10

## 2014-02-17 NOTE — Patient Instructions (Signed)
PICC Home Guide A peripherally inserted central catheter (PICC) is a long, thin, flexible tube that is inserted into a vein in the upper arm. It is a form of intravenous (IV) access. It is considered to be a "central" line because the tip of the PICC ends in a large vein in your chest. This large vein is called the superior vena cava (SVC). The PICC tip ends in the SVC because there is a lot of blood flow in the SVC. This allows medicines and IV fluids to be quickly distributed throughout the body. The PICC is inserted using a sterile technique by a specially trained nurse or physician. After the PICC is inserted, a chest X-ray exam is done to be sure it is in the correct place.  A PICC may be placed for different reasons, such as:  To give medicines and liquid nutrition that can only be given through a central line. Examples are:  Certain antibiotic treatments.  Chemotherapy.  Total parenteral nutrition (TPN).  To take frequent blood samples.  To give IV fluids and blood products.  If there is difficulty placing a peripheral intravenous (PIV) catheter. If taken care of properly, a PICC can remain in place for several months. A PICC can also allow a person to go home from the hospital early. Medicine and PICC care can be managed at home by a family member or home health care team. WHAT PROBLEMS CAN HAPPEN WHEN I HAVE A PICC? Problems with a PICC can occasionally occur. These may include the following:  A blood clot (thrombus) forming in or at the tip of the PICC. This can cause the PICC to become clogged. A clot-dissolving medicine called tissue plasminogen activator (tPA) can be given through the PICC to help break up the clot.  Inflammation of the vein (phlebitis) in which the PICC is placed. Signs of inflammation may include redness, pain at the insertion site, red streaks, or being able to feel a "cord" in the vein where the PICC is located.  Infection in the PICC or at the insertion  site. Signs of infection may include fever, chills, redness, swelling, or pus drainage from the PICC insertion site.  PICC movement (malposition). The PICC tip may move from its original position due to excessive physical activity, forceful coughing, sneezing, or vomiting.  A break or cut in the PICC. It is important to not use scissors near the PICC.  Nerve or tendon irritation or injury during PICC insertion. WHAT SHOULD I KEEP IN MIND ABOUT ACTIVITIES WHEN I HAVE A PICC?  You may bend your arm and move it freely. If your PICC is near or at the bend of your elbow, avoid activity with repeated motion at the elbow.  Rest at home for the remainder of the day following PICC line insertion.  Avoid lifting heavy objects as instructed by your health care provider.  Avoid using a crutch with the arm on the same side as your PICC. You may need to use a walker. WHAT SHOULD I KNOW ABOUT MY PICC DRESSING?  Keep your PICC bandage (dressing) clean and dry to prevent infection.  Ask your health care provider when you may shower. Ask your health care provider to teach you how to wrap the PICC when you do take a shower.  Change the PICC dressing as instructed by your health care provider.  Change your PICC dressing if it becomes loose or wet. WHAT SHOULD I KNOW ABOUT PICC CARE?  Check the PICC insertion site   daily for leakage, redness, swelling, or pain.  Do not take a bath, swim, or use hot tubs when you have a PICC. Cover PICC line with clear plastic wrap and tape to keep it dry while showering.  Flush the PICC as directed by your health care provider. Let your health care provider know right away if the PICC is difficult to flush or does not flush. Do not use force to flush the PICC.  Do not use a syringe that is less than 10 mL to flush the PICC.  Never pull or tug on the PICC.  Avoid blood pressure checks on the arm with the PICC.  Keep your PICC identification card with you at all  times.  Do not take the PICC out yourself. Only a trained clinical professional should remove the PICC. SEEK IMMEDIATE MEDICAL CARE IF:  Your PICC is accidentally pulled all the way out. If this happens, cover the insertion site with a bandage or gauze dressing. Do not throw the PICC away. Your health care provider will need to inspect it.  Your PICC was tugged or pulled and has partially come out. Do not  push the PICC back in.  There is any type of drainage, redness, or swelling where the PICC enters the skin.  You cannot flush the PICC, it is difficult to flush, or the PICC leaks around the insertion site when it is flushed.  You hear a "flushing" sound when the PICC is flushed.  You have pain, discomfort, or numbness in your arm, shoulder, or jaw on the same side as the PICC.  You feel your heart "racing" or skipping beats.  You notice a hole or tear in the PICC.  You develop chills or a fever. MAKE SURE YOU:   Understand these instructions.  Will watch your condition.  Will get help right away if you are not doing well or get worse. Document Released: 10/14/2002 Document Revised: 08/24/2013 Document Reviewed: 12/15/2012 Exit[employer] Patient Information 2015 [employer]. This information is not intended to replace advice given to you by your health care provider. Make sure you discuss any questions you have with your health care provider.  

## 2014-02-22 ENCOUNTER — Other Ambulatory Visit: Payer: Self-pay | Admitting: Oncology

## 2014-02-22 DIAGNOSIS — C541 Malignant neoplasm of endometrium: Secondary | ICD-10-CM

## 2014-02-24 ENCOUNTER — Inpatient Hospital Stay (HOSPITAL_COMMUNITY)
Admission: EM | Admit: 2014-02-24 | Discharge: 2014-03-01 | DRG: 872 | Disposition: A | Discharge status: Home / Self Care | Payer: BC Managed Care – PPO | Attending: Internal Medicine | Admitting: Internal Medicine

## 2014-02-24 ENCOUNTER — Other Ambulatory Visit: Payer: BC Managed Care – PPO

## 2014-02-24 ENCOUNTER — Emergency Department (HOSPITAL_COMMUNITY): Payer: BC Managed Care – PPO

## 2014-02-24 ENCOUNTER — Encounter (HOSPITAL_COMMUNITY): Payer: Self-pay

## 2014-02-24 ENCOUNTER — Telehealth: Payer: Self-pay

## 2014-02-24 DIAGNOSIS — B359 Dermatophytosis, unspecified: Secondary | ICD-10-CM | POA: Diagnosis present

## 2014-02-24 DIAGNOSIS — D6959 Other secondary thrombocytopenia: Secondary | ICD-10-CM | POA: Diagnosis present

## 2014-02-24 DIAGNOSIS — I82629 Acute embolism and thrombosis of deep veins of unspecified upper extremity: Secondary | ICD-10-CM | POA: Diagnosis present

## 2014-02-24 DIAGNOSIS — T451X5A Adverse effect of antineoplastic and immunosuppressive drugs, initial encounter: Secondary | ICD-10-CM | POA: Diagnosis present

## 2014-02-24 DIAGNOSIS — E669 Obesity, unspecified: Secondary | ICD-10-CM | POA: Diagnosis present

## 2014-02-24 DIAGNOSIS — E876 Hypokalemia: Secondary | ICD-10-CM | POA: Insufficient documentation

## 2014-02-24 DIAGNOSIS — Z6841 Body Mass Index (BMI) 40.0 and over, adult: Secondary | ICD-10-CM

## 2014-02-24 DIAGNOSIS — B961 Klebsiella pneumoniae [K. pneumoniae] as the cause of diseases classified elsewhere: Secondary | ICD-10-CM | POA: Diagnosis present

## 2014-02-24 DIAGNOSIS — A419 Sepsis, unspecified organism: Principal | ICD-10-CM | POA: Diagnosis present

## 2014-02-24 DIAGNOSIS — K8010 Calculus of gallbladder with chronic cholecystitis without obstruction: Secondary | ICD-10-CM | POA: Diagnosis present

## 2014-02-24 DIAGNOSIS — K219 Gastro-esophageal reflux disease without esophagitis: Secondary | ICD-10-CM | POA: Diagnosis present

## 2014-02-24 DIAGNOSIS — G620 Drug-induced polyneuropathy: Secondary | ICD-10-CM | POA: Diagnosis present

## 2014-02-24 DIAGNOSIS — Z79899 Other long term (current) drug therapy: Secondary | ICD-10-CM

## 2014-02-24 DIAGNOSIS — C541 Malignant neoplasm of endometrium: Secondary | ICD-10-CM | POA: Diagnosis present

## 2014-02-24 DIAGNOSIS — N390 Urinary tract infection, site not specified: Secondary | ICD-10-CM | POA: Diagnosis present

## 2014-02-24 DIAGNOSIS — D539 Nutritional anemia, unspecified: Secondary | ICD-10-CM | POA: Diagnosis present

## 2014-02-24 DIAGNOSIS — Z86718 Personal history of other venous thrombosis and embolism: Secondary | ICD-10-CM

## 2014-02-24 DIAGNOSIS — C786 Secondary malignant neoplasm of retroperitoneum and peritoneum: Secondary | ICD-10-CM | POA: Diagnosis present

## 2014-02-24 DIAGNOSIS — I82621 Acute embolism and thrombosis of deep veins of right upper extremity: Secondary | ICD-10-CM

## 2014-02-24 DIAGNOSIS — K811 Chronic cholecystitis: Secondary | ICD-10-CM | POA: Diagnosis present

## 2014-02-24 DIAGNOSIS — Z1611 Resistance to penicillins: Secondary | ICD-10-CM | POA: Diagnosis present

## 2014-02-24 DIAGNOSIS — N3000 Acute cystitis without hematuria: Secondary | ICD-10-CM

## 2014-02-24 DIAGNOSIS — R509 Fever, unspecified: Secondary | ICD-10-CM | POA: Diagnosis not present

## 2014-02-24 DIAGNOSIS — B3789 Other sites of candidiasis: Secondary | ICD-10-CM

## 2014-02-24 HISTORY — DX: Wedge compression fracture of second lumbar vertebra, initial encounter for closed fracture: S32.020A

## 2014-02-24 HISTORY — DX: Cellulitis of right lower limb: L03.115

## 2014-02-24 HISTORY — DX: Iron deficiency anemia, unspecified: D50.9

## 2014-02-24 LAB — CBC WITH DIFFERENTIAL/PLATELET
Basophils Absolute: 0 10*3/uL (ref 0.0–0.1)
Basophils Relative: 0 % (ref 0–1)
EOS PCT: 0 % (ref 0–5)
Eosinophils Absolute: 0 10*3/uL (ref 0.0–0.7)
HCT: 38.8 % (ref 36.0–46.0)
Hemoglobin: 13 g/dL (ref 12.0–15.0)
LYMPHS ABS: 0.5 10*3/uL — AB (ref 0.7–4.0)
LYMPHS PCT: 6 % — AB (ref 12–46)
MCH: 35.9 pg — ABNORMAL HIGH (ref 26.0–34.0)
MCHC: 33.5 g/dL (ref 30.0–36.0)
MCV: 107.2 fL — ABNORMAL HIGH (ref 78.0–100.0)
MONOS PCT: 11 % (ref 3–12)
Monocytes Absolute: 0.8 10*3/uL (ref 0.1–1.0)
Neutro Abs: 6.5 10*3/uL (ref 1.7–7.7)
Neutrophils Relative %: 83 % — ABNORMAL HIGH (ref 43–77)
PLATELETS: 105 10*3/uL — AB (ref 150–400)
RBC: 3.62 MIL/uL — AB (ref 3.87–5.11)
RDW: 14.7 % (ref 11.5–15.5)
WBC: 7.8 10*3/uL (ref 4.0–10.5)

## 2014-02-24 LAB — URINALYSIS, ROUTINE W REFLEX MICROSCOPIC
Glucose, UA: NEGATIVE mg/dL
KETONES UR: NEGATIVE mg/dL
Nitrite: POSITIVE — AB
PROTEIN: 100 mg/dL — AB
Specific Gravity, Urine: 1.03 (ref 1.005–1.030)
Urobilinogen, UA: 1 mg/dL (ref 0.0–1.0)
pH: 6 (ref 5.0–8.0)

## 2014-02-24 LAB — URINE MICROSCOPIC-ADD ON

## 2014-02-24 LAB — COMPREHENSIVE METABOLIC PANEL
ALK PHOS: 100 U/L (ref 39–117)
ALT: 34 U/L (ref 0–35)
AST: 37 U/L (ref 0–37)
Albumin: 3.4 g/dL — ABNORMAL LOW (ref 3.5–5.2)
Anion gap: 17 — ABNORMAL HIGH (ref 5–15)
BUN: 15 mg/dL (ref 6–23)
CO2: 24 meq/L (ref 19–32)
Calcium: 9.2 mg/dL (ref 8.4–10.5)
Chloride: 92 mEq/L — ABNORMAL LOW (ref 96–112)
Creatinine, Ser: 0.77 mg/dL (ref 0.50–1.10)
GLUCOSE: 140 mg/dL — AB (ref 70–99)
POTASSIUM: 3.5 meq/L — AB (ref 3.7–5.3)
SODIUM: 133 meq/L — AB (ref 137–147)
Total Bilirubin: 1 mg/dL (ref 0.3–1.2)
Total Protein: 7.3 g/dL (ref 6.0–8.3)

## 2014-02-24 LAB — PROCALCITONIN: Procalcitonin: 1.13 ng/mL

## 2014-02-24 LAB — LIPASE, BLOOD: Lipase: 11 U/L (ref 11–59)

## 2014-02-24 LAB — I-STAT CG4 LACTIC ACID, ED
LACTIC ACID, VENOUS: 2.1 mmol/L (ref 0.5–2.2)
Lactic Acid, Venous: 2.69 mmol/L — ABNORMAL HIGH (ref 0.5–2.2)

## 2014-02-24 MED ORDER — OXYCODONE-ACETAMINOPHEN 10-325 MG PO TABS: 1.0000 | ORAL_TABLET | ORAL | Status: DC | PRN

## 2014-02-24 MED ORDER — SODIUM CHLORIDE 0.9 % IV BOLUS (SEPSIS)
1000.0000 mL | Freq: Once | INTRAVENOUS | Status: AC
  Administered 2014-02-24: 1000 mL via INTRAVENOUS

## 2014-02-24 MED ORDER — B COMPLEX VITAMINS PO CAPS: 1.0000 | ORAL_CAPSULE | Freq: Every day | ORAL | Status: DC

## 2014-02-24 MED ORDER — MICONAZOLE NITRATE POWD
1.0000 "application " | Freq: Four times a day (QID) | Status: DC
  Administered 2014-02-24 – 2014-03-01 (×18): 1 via TOPICAL

## 2014-02-24 MED ORDER — OXYCODONE HCL 5 MG PO TABS
5.0000 mg | ORAL_TABLET | ORAL | Status: DC | PRN
  Administered 2014-02-24 – 2014-02-27 (×4): 5 mg via ORAL
  Filled 2014-02-24 (×4): qty 1

## 2014-02-24 MED ORDER — OXYCODONE-ACETAMINOPHEN 5-325 MG PO TABS
1.0000 | ORAL_TABLET | ORAL | Status: DC | PRN
  Administered 2014-02-24 – 2014-02-27 (×4): 1 via ORAL
  Filled 2014-02-24 (×4): qty 1

## 2014-02-24 MED ORDER — VITAMIN C 500 MG PO TABS: 1000.0000 mg | ORAL_TABLET | Freq: Every day | ORAL | Status: DC

## 2014-02-24 MED ORDER — PANTOPRAZOLE SODIUM 40 MG PO TBEC
40.0000 mg | DELAYED_RELEASE_TABLET | Freq: Every day | ORAL | Status: DC
Start: 2014-02-25 — End: 2014-03-01
  Administered 2014-02-25 – 2014-03-01 (×5): 40 mg via ORAL
  Filled 2014-02-24 (×5): qty 1

## 2014-02-24 MED ORDER — CALCIUM CARBONATE 600 MG PO TABS: 600.0000 mg | ORAL_TABLET | Freq: Every day | ORAL | Status: DC

## 2014-02-24 MED ORDER — FERROUS SULFATE 325 (65 FE) MG PO TABS
325.0000 mg | ORAL_TABLET | Freq: Three times a day (TID) | ORAL | Status: DC
  Administered 2014-02-25 – 2014-03-01 (×13): 325 mg via ORAL
  Filled 2014-02-24 (×16): qty 1

## 2014-02-24 MED ORDER — ACETAMINOPHEN 325 MG PO TABS
325.0000 mg | ORAL_TABLET | Freq: Once | ORAL | Status: AC
  Administered 2014-02-24: 325 mg via ORAL

## 2014-02-24 MED ORDER — FONDAPARINUX SODIUM 10 MG/0.8ML ~~LOC~~ SOLN
10.0000 mg | Freq: Every day | SUBCUTANEOUS | Status: DC
  Administered 2014-02-25 – 2014-03-01 (×5): 10 mg via SUBCUTANEOUS
  Filled 2014-02-24 (×7): qty 0.8

## 2014-02-24 MED ORDER — LORATADINE 10 MG PO TABS
10.0000 mg | ORAL_TABLET | Freq: Every day | ORAL | Status: DC
Start: 2014-02-24 — End: 2014-03-01
  Administered 2014-02-24 – 2014-03-01 (×6): 10 mg via ORAL
  Filled 2014-02-24 (×6): qty 1

## 2014-02-24 MED ORDER — SODIUM CHLORIDE 0.9 % IV SOLN
INTRAVENOUS | Status: AC
  Administered 2014-02-24 (×2): via INTRAVENOUS

## 2014-02-24 MED ORDER — CALCIUM CARBONATE 1250 (500 CA) MG PO TABS
1.0000 | ORAL_TABLET | Freq: Every day | ORAL | Status: DC
  Administered 2014-02-25 – 2014-03-01 (×5): 500 mg via ORAL
  Filled 2014-02-24 (×6): qty 1

## 2014-02-24 MED ORDER — ADULT MULTIVITAMIN W/MINERALS CH
1.0000 | ORAL_TABLET | Freq: Every day | ORAL | Status: DC
  Administered 2014-02-25 – 2014-03-01 (×5): 1 via ORAL
  Filled 2014-02-24 (×5): qty 1

## 2014-02-24 MED ORDER — ACETAMINOPHEN 650 MG RE SUPP: 650.0000 mg | Freq: Four times a day (QID) | RECTAL | Status: DC | PRN

## 2014-02-24 MED ORDER — PROMETHAZINE HCL 25 MG PO TABS
12.5000 mg | ORAL_TABLET | Freq: Four times a day (QID) | ORAL | Status: DC | PRN
  Administered 2014-02-25 – 2014-02-26 (×2): 12.5 mg via ORAL
  Filled 2014-02-24 (×2): qty 1

## 2014-02-24 MED ORDER — GABAPENTIN 100 MG PO CAPS
200.0000 mg | ORAL_CAPSULE | Freq: Three times a day (TID) | ORAL | Status: DC
Start: 2014-02-24 — End: 2014-03-01
  Administered 2014-02-24 – 2014-03-01 (×14): 200 mg via ORAL
  Filled 2014-02-24 (×16): qty 2

## 2014-02-24 MED ORDER — DEXTROSE 5 % IV SOLN
1.0000 g | Freq: Three times a day (TID) | INTRAVENOUS | Status: DC
  Administered 2014-02-24 – 2014-02-28 (×12): 1 g via INTRAVENOUS
  Filled 2014-02-24 (×12): qty 1

## 2014-02-24 MED ORDER — FOLIC ACID 400 MCG PO TABS: 400.0000 ug | ORAL_TABLET | Freq: Every day | ORAL | Status: DC

## 2014-02-24 MED ORDER — B COMPLEX-C PO TABS
1.0000 | ORAL_TABLET | Freq: Every day | ORAL | Status: DC
  Administered 2014-02-25 – 2014-03-01 (×5): 1 via ORAL
  Filled 2014-02-24 (×6): qty 1

## 2014-02-24 MED ORDER — POTASSIUM CHLORIDE ER 10 MEQ PO TBCR
20.0000 meq | EXTENDED_RELEASE_TABLET | Freq: Every day | ORAL | Status: DC
  Administered 2014-02-24 – 2014-02-25 (×2): 20 meq via ORAL
  Filled 2014-02-24 (×3): qty 2

## 2014-02-24 MED ORDER — VITAMIN B-12 1000 MCG PO TABS
2500.0000 ug | ORAL_TABLET | Freq: Every day | ORAL | Status: DC
  Administered 2014-02-24 – 2014-02-25 (×2): 2500 ug via ORAL
  Filled 2014-02-24 (×5): qty 1

## 2014-02-24 MED ORDER — VITAMIN D3 25 MCG (1000 UNIT) PO TABS
2000.0000 [IU] | ORAL_TABLET | Freq: Every day | ORAL | Status: DC
  Administered 2014-02-25 – 2014-03-01 (×5): 2000 [IU] via ORAL
  Filled 2014-02-24 (×5): qty 2

## 2014-02-24 MED ORDER — FOLIC ACID 0.5 MG HALF TAB
0.5000 mg | ORAL_TABLET | Freq: Every day | ORAL | Status: DC
  Administered 2014-02-25 – 2014-03-01 (×5): 0.5 mg via ORAL
  Filled 2014-02-24 (×5): qty 1

## 2014-02-24 MED ORDER — SODIUM CHLORIDE 0.9 % IV BOLUS (SEPSIS)
500.0000 mL | Freq: Once | INTRAVENOUS | Status: AC
  Administered 2014-02-24: 500 mL via INTRAVENOUS

## 2014-02-24 MED ORDER — ACETAMINOPHEN 325 MG PO TABS
650.0000 mg | ORAL_TABLET | Freq: Four times a day (QID) | ORAL | Status: DC | PRN
  Administered 2014-02-24 – 2014-03-01 (×6): 650 mg via ORAL
  Filled 2014-02-24 (×7): qty 2

## 2014-02-24 MED ORDER — KETOROLAC TROMETHAMINE 30 MG/ML IJ SOLN
30.0000 mg | Freq: Once | INTRAMUSCULAR | Status: AC
  Administered 2014-02-24: 30 mg via INTRAVENOUS
  Filled 2014-02-24: qty 1

## 2014-02-24 MED ORDER — VITAMIN D3 50 MCG (2000 UT) PO TABS: 1.0000 | ORAL_TABLET | Freq: Every day | ORAL | Status: DC

## 2014-02-24 MED ORDER — MULTI-VITAMIN/MINERALS PO TABS: 1.0000 | ORAL_TABLET | Freq: Every day | ORAL | Status: DC

## 2014-02-24 MED ORDER — MAGNESIUM GLUCONATE 500 MG PO TABS
500.0000 mg | ORAL_TABLET | Freq: Every day | ORAL | Status: DC
  Administered 2014-02-25 – 2014-03-01 (×5): 500 mg via ORAL
  Filled 2014-02-24 (×5): qty 1

## 2014-02-24 MED ORDER — DEXTROSE 5 % IV SOLN: 1.0000 g | Freq: Once | INTRAVENOUS | Status: DC

## 2014-02-24 MED ORDER — ALUM & MAG HYDROXIDE-SIMETH 200-200-20 MG/5ML PO SUSP: 30.0000 mL | Freq: Four times a day (QID) | ORAL | Status: DC | PRN

## 2014-02-24 MED ORDER — CYCLOBENZAPRINE HCL 10 MG PO TABS: 5.0000 mg | ORAL_TABLET | Freq: Three times a day (TID) | ORAL | Status: DC | PRN

## 2014-02-24 NOTE — ED Notes (Signed)
Attempted calling report to floor. RN will call back promptly. 

## 2014-02-24 NOTE — Plan of Care (Signed)
Problem: Phase I Progression Outcomes Goal: Voiding-avoid urinary catheter unless indicated Outcome: Progressing     

## 2014-02-24 NOTE — Plan of Care (Signed)
Problem: Phase I Progression Outcomes Goal: Vital Signs stable- temperature less than 102 Outcome: Progressing

## 2014-02-24 NOTE — ED Notes (Signed)
Bed: WA07 Expected date:  Expected time:  Means of arrival:  Comments: 

## 2014-02-24 NOTE — Plan of Care (Signed)
Problem: Phase I Progression Outcomes Goal: OOB as tolerated unless otherwise ordered Outcome: Completed/Met Date Met:  02/24/14

## 2014-02-24 NOTE — H&P (Signed)
History and Physical:    [name]:[id number] DOB: 1966-02-14 DOA: 02/24/2014  Referring physician: Dr. Carmin Muskrat PCP: Orpah Melter, MD  Oncologist: Dr. Marko Plume  Chief Complaint: Shaking chills, high fever  History of Present Illness:   [name] is an 48 y.o. female with a PMH of DVT on chronic anticoagulation, metastatic uterine cancer receiving chemotherapy (received Carboplatin, Emend and decadron on 02/10/14 followed by Neulasta support on 02/11/14)  who awoke this morning with shaking chills and fevers with a home temperature reading of 107.  The patient reported having to urinate every 1-2 hours, with urgency and urinary incontinence over the past 24 hours.  Also had an episode of nausea and vomiting this morning and lost her appetite last evening.  Called the cancer center this morning regarding her symptoms and was instructed to come to the ER for further evaluation.  Upon initial evaluation in the ER, the patient was found to have an unremarkable CXR, no leukocytosis, low grade fever of 100.  Urinalysis was positive for nitrites and leukocytes.  ROS:   Constitutional: + fever, + chills;  Appetite normal until this a.m.; No weight loss, no weight gain, no fatigue.  HEENT: No blurry vision, no diplopia, no pharyngitis, no dysphagia CV: No chest pain, no palpitations, no PND, no orthopnea, no edema.  Resp: No SOB, no cough, no pleuritic pain. GI: + nausea, + vomiting, no diarrhea, no melena, no hematochezia, no constipation, no abdominal pain.  GU: + dysuria, no hematuria, + frequency, + urgency. MSK: no myalgias, no arthralgias.  Neuro:  + dull headache, no focal neurological deficits, no history of seizures.  Psych: No depression, no anxiety.  Endo: No heat intolerance, no cold intolerance, no polyuria, no polydipsia  Skin: No rashes, no skin lesions.  Heme: + easy bruising.  Travel history: No recent travel.   Past Medical History:   Past Medical History    Diagnosis Date  . Obesity   . DVT (deep venous thrombosis) 2009    RLE, tx for ~6 months  . PONV (postoperative nausea and vomiting)   . Swelling     BOTH LEGS  . Rash     LOWER ABD  . Gallstones   . Ascites   . Uterine cancer 2010    Dr. Humphrey Rolls, chemotherapy  . Family history of anesthesia complication     MOTHER IS DIFFICULT TO WAKE  . Gallstones   . Cellulitis of leg, right   . Cellulitis of right lower extremity 03/13/2013  . Iron deficiency anemia 03/12/2013  . Compression fracture of L2 04/14/2013  . History of recent kyphoplasty 03/13/2013    Past Surgical History:   Past Surgical History  Procedure Laterality Date  . Ankle surgery  1990  . Wisdom tooth extraction    . Tonsillectomy and adenoidectomy    . Abdominal hysterectomy  12/2009    [facility], [facility]  . Laparoscopy N/A 10/02/2012    Procedure: LAPAROSCOPY DIAGNOSTIC, perocentisis, omental biopsy;  Surgeon: Imogene Burn. Tsuei, MD;  Location: [facility];  Service: General;  Laterality: N/A;  removed a total of 15,000 of acities  . Paracentesis    . Kyphoplasty  02/26/2013    Social History:   History   Social History  . Marital Status: Single    Spouse Name: N/A    Number of Children: 0  . Years of Education: N/A   Occupational History  . Office work.    Social History Main Topics  .  Smoking status: Never Smoker   . Smokeless tobacco: Never Used  . Alcohol Use: Yes     Comment: social, Occassionally  . Drug Use: No  . Sexual Activity: No   Other Topics Concern  . Not on file   Social History Narrative   Lives with her parents.  Uses a walker to ambulate and wheelchair.  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      Family history:   Family History  Problem Relation Age of Onset  . Heart disease Father   . Diabetes Brother   . Heart disease Mother   . Cirrhosis Father   . Diabetes Father   . Thyroid disease Mother   . Diabetes Mother   . Kidney disease Brother     from diabetes  . Breast cancer Maternal  Aunt   . Breast cancer Maternal Aunt   . Melanoma Maternal Aunt   . Melanoma Maternal Aunt   . Other Maternal Aunt     lupus anticardiolipin ab syndrome    Allergies   Review of patient's allergies indicates no known allergies.  Current Medications:   Prior to Admission medications   Medication Sig Start Date End Date Taking? Authorizing Provider  Ascorbic Acid (VITAMIN C) 1000 MG tablet Take 1,000 mg by mouth daily.   Yes Historical Provider, MD  b complex vitamins capsule Take 1 capsule by mouth daily.   Yes Historical Provider, MD  calcium carbonate (OS-CAL) 600 MG TABS tablet Take 600 mg by mouth daily.   Yes Historical Provider, MD  Cholecalciferol (VITAMIN D3) 2000 UNITS TABS Take 1 tablet by mouth daily.   Yes Historical Provider, MD  Cyanocobalamin (VITAMIN B-12 PO) Take 2,500 mg by mouth daily.    Yes Historical Provider, MD  cyclobenzaprine (FLEXERIL) 5 MG tablet Take 1 tablet (5 mg total) by mouth 3 (three) times daily as needed for muscle spasms. 12/09/13  Yes Lennis Marion Downer, MD  ferrous sulfate 325 (65 FE) MG tablet Take 325 mg by mouth 3 (three) times daily with meals.   Yes Historical Provider, MD  folic acid (FOLVITE) 703 MCG tablet Take 400 mcg by mouth daily.     Yes Historical Provider, MD  fondaparinux (ARIXTRA) 10 MG/0.8ML SOLN injection Inject 10 mg into the skin daily.   Yes Historical Provider, MD  furosemide (LASIX) 20 MG tablet Take 1 tablet (20 mg total) by mouth every other day. 03/16/13  Yes Kinnie Feil, MD  gabapentin (NEURONTIN) 100 MG capsule Take 2 capsules (200 mg total) by mouth 3 (three) times daily. 12/25/13  Yes Lennis Marion Downer, MD  heparin lock flush 100 UNIT/ML SOLN injection Inject 250 Units into the vein See admin instructions. Flush each Lumen with 250 units 2.57ml daily   Yes Historical Provider, MD  loratadine (CLARITIN) 10 MG tablet Take 10 mg by mouth daily.   Yes Historical Provider, MD  magnesium gluconate (MAGONATE) 500 MG tablet Take  500 mg by mouth daily.   Yes Historical Provider, MD  miconazole nitrate (MICATIN) POWD Apply 1 application topically 4 (four) times daily. 02/10/14  Yes Lennis Marion Downer, MD  Multiple Vitamins-Minerals (MULTIVITAMIN WITH MINERALS) tablet Take 1 tablet by mouth daily.    Yes Historical Provider, MD  nystatin ointment (MYCOSTATIN) Apply 1 application topically 2 (two) times daily. 03/10/13  Yes Vivien Rota, NP  ondansetron (ZOFRAN ODT) 8 MG disintegrating tablet Take 1 tablet (8 mg total) by mouth every 8 (eight) hours as needed for nausea or  vomiting. 05/28/13  Yes Wilmon Arms, MD  ondansetron (ZOFRAN) 8 MG tablet Take 8 mg by mouth 2 (two) times daily as needed for nausea or vomiting.   Yes Historical Provider, MD  oxyCODONE-acetaminophen (PERCOCET) 10-325 MG per tablet Take 1 tablet by mouth every 4 (four) hours as needed for pain. 12/30/13  Yes Minette Headland, NP  pantoprazole (PROTONIX) 40 MG tablet Take 40 mg by mouth daily.   Yes Historical Provider, MD  potassium chloride (K-DUR) 10 MEQ tablet Take 2 tablets (20 mEq total) by mouth daily. 05/01/13  Yes Minette Headland, NP  prochlorperazine (COMPAZINE) 10 MG tablet Take 10 mg by mouth every 6 (six) hours as needed for nausea or vomiting.   Yes Historical Provider, MD  promethazine (PHENERGAN) 25 MG tablet Take 12.5 mg by mouth every 6 (six) hours as needed for nausea or vomiting.   Yes Historical Provider, MD  sucralfate (CARAFATE) 1 GM/10ML suspension Take 1 g by mouth 4 (four) times daily.   Yes Historical Provider, MD  ferrous sulfate 325 (65 FE) MG tablet TAKE 1 TABLET BY MOUTH 3 TIMES A DAY WITH MEALS 11/18/13   Lennis Marion Downer, MD  ondansetron (ZOFRAN) 8 MG tablet TAKE 1 TABLET BY MOUTH TWICE A DAY AS NEEDED START DAY AFTER CHEMO 01/29/14   Lennis Marion Downer, MD    Physical Exam:   Filed Vitals:   02/24/14 1330 02/24/14 1400 02/24/14 1430 02/24/14 1500  BP: 101/57 103/62 91/42 90/63   Pulse: 96 99 102 103  Temp:       TempSrc:      Resp: 35 36 19 36  Weight:      SpO2: 98% 99% 98% 95%     Physical Exam: Blood pressure 90/63, pulse 103, temperature 100 F (37.8 C), temperature source Oral, resp. rate 36, weight 191.872 kg (423 lb), SpO2 95 %. Gen: No acute distress.  + Rigors Head: Normocephalic, atraumatic. Eyes: PERRL, EOMI, sclerae nonicteric. Mouth: Oropharynx clear with slightly dry mucous membranes and slight white coating to tongue. Neck: Supple, no thyromegaly, no lymphadenopathy, no jugular venous distention. Chest: Lungs clear to auscultation bilaterally. CV: Heart sounds heart tachycardic, regular. Abdomen: Soft, nontender, nondistended with normal active bowel sounds. Extremities: Extremities show trace edema with venous stasis changes. Skin: Warm and dry. Neuro: Alert and oriented times 3; cranial nerves II through XII grossly intact. Psych: Mood and affect normal.   Data Review:    Labs: Basic Metabolic Panel:  Recent Labs Lab 02/24/14 1105  NA 133*  K 3.5*  CL 92*  CO2 24  GLUCOSE 140*  BUN 15  CREATININE 0.77  CALCIUM 9.2   Liver Function Tests:  Recent Labs Lab 02/24/14 1105  AST 37  ALT 34  ALKPHOS 100  BILITOT 1.0  PROT 7.3  ALBUMIN 3.4*    Recent Labs Lab 02/24/14 1105  LIPASE 11   CBC:  Recent Labs Lab 02/24/14 1105  WBC 7.8  NEUTROABS 6.5  HGB 13.0  HCT 38.8  MCV 107.2*  PLT 105*    BNP (last 3 results)  Recent Labs  03/12/13 0845  PROBNP 2322.0*   CBG: No results for input(s): GLUCAP in the last 168 hours.  Radiographic Studies: Dg Chest Port 1 View  02/24/2014   CLINICAL DATA:  48 year old female with high fever ranging from 104 to 107 since yesterday. Recent PICC placement.  EXAM: PORTABLE CHEST - 1 VIEW  COMPARISON:  Chest CT 08/03/2013.  Chest x-ray 03/12/2013.  FINDINGS: There  is a left upper extremity PICC with tip terminating in the right atrium, approximately 3.6 cm distal to the superior cavoatrial junction. Lung  volumes are normal. No acute consolidative airspace disease. No pleural effusions. No pneumothorax. No evidence of pulmonary edema. Heart size is mildly enlarged. The patient is rotated to the right on today's exam, resulting in distortion of the mediastinal contours and reduced diagnostic sensitivity and specificity for mediastinal pathology.  IMPRESSION: 1. No radiographic evidence of acute cardiopulmonary disease. 2. Left upper extremity PICC terminates in the right atrium approximately 3.6 cm distal to the superior cavoatrial junction.   Electronically Signed   By: Vinnie Langton M.D.   On: 02/24/2014 10:57     Assessment/Plan:   Principal Problem:   Sepsis secondary to UTI  Hemodynamically stable but given tachycardia, tachypnea, and fever with  A suspected urinary source of infection, the patient meets the definition of sepsis.  Continue empiric cefepime for now. Narrow antibiotics once cultures back.  Follow-up blood and urine cultures.  Active Problems:   Obesity - BMI 79  Has been in diet counseling therapy in the past.    Chronic cholecystitis with calculus  Underwent diagnostic laparoscopy, paracentesis and omental biopsies 10/02/12.    Endometrial cancer  Diagnosed 06/2007, Status post robotic hysterectomy and bilateral salpingo-oophorectomies 12/2009. 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  We'll notify Dr. Marko Plume of the patient's admission.    DVT of upper extremity (deep vein thrombosis)  Continue Arixtra.    Chemotherapy-induced neuropathy  Continue Neurontin.  Code Status: Full.  Discussed with the patient. Family Communication: Tyriana Helmkamp [phone]), mother. Disposition Plan: Home when stable.  Time spent: 1 hour.  Cheng Dec [facility] Pager [phone] Cell: [phone]   If 7PM-7AM, please contact night-coverage [email or web address] Password [email or web address] 02/24/2014, 3:14 PM

## 2014-02-24 NOTE — Progress Notes (Signed)
ANTIBIOTIC CONSULT NOTE - INITIAL  Pharmacy Consult for Cefepime Indication: rule out sepsis  No Known Allergies  Patient Measurements: Weight: (!) 423 lb (191.872 kg)  Vital Signs: Temp: 100 F (37.8 C) (11/04 1031) Temp Source: Oral (11/04 1031) BP: 101/58 mmHg (11/04 1230) Pulse Rate: 97 (11/04 1230) Intake/Output from previous day:   Intake/Output from this shift:    Labs:  Recent Labs  02/24/14 1105  WBC 7.8  HGB 13.0  PLT 105*  CREATININE 0.77   Estimated Creatinine Clearance: 165.3 mL/min (by C-G formula based on Cr of 0.77). No results for input(s): VANCOTROUGH, VANCOPEAK, VANCORANDOM, GENTTROUGH, GENTPEAK, GENTRANDOM, TOBRATROUGH, TOBRAPEAK, TOBRARND, AMIKACINPEAK, AMIKACINTROU, AMIKACIN in the last 72 hours.   Microbiology: No results found for this or any previous visit (from the past 720 hour(s)).  Medical History: Past Medical History  Diagnosis Date  . Obesity   . DVT (deep venous thrombosis) 2009    RLE, tx for ~6 months  . PONV (postoperative nausea and vomiting)   . Swelling     BOTH LEGS  . Rash     LOWER ABD  . Gallstones   . Ascites   . Uterine cancer     Dr. Humphrey Rolls, chemotherapy  . Family history of anesthesia complication     MOTHER IS DIFFICULT TO WAKE  . Gallstones   . Cellulitis of leg, right     Assessment: 59 yoF with h/o metastatic uterine cancer, obesity and DVT (treated x 6 months). Presented to ER with fever, listlessness, polyuria w/o abdominal pain. Pharmacy consulted to dose Cefepime for sepsis.  11/4 >>Cefepime >>  Tmax: 100 WBCs: WNL Renal: SCr 0.77 CrCl > 172ml/min N  11/4 blood: sent 11/4 urine: sent  Goal of Therapy:  Appropriate antibiotic dosing for renal function; eradication of infection  Plan:  Start Cefepime 1g IV Q8H  Follow up culture results  Kizzie Furnish, PharmD Pager: [phone] 02/24/2014 1:28 PM

## 2014-02-24 NOTE — ED Notes (Addendum)
Pt here from cancer center.  She was there for PICC line dressing change.  Per pt, for 2 days she has had frequent urination.  Pt has been nauseated with abdominal pain.  Pt states high fever since yesterday.  104 and states also up to 107.  MD at bedside.  Pt took tylenol at 7 am.  Has PICC line.  Some redness noted to PICC site in left arm.

## 2014-02-24 NOTE — Telephone Encounter (Signed)
[name] mother called stating that [name] has a temp of 104.8 with tylenol in her. Temp higher then that during the night.  Patient with shaking chills nausea and vomiting.  Pt. with urinary frequency. Told mother that Dr. Marko Plume said she needs to go to [facility] ED to be evaluated.  Mother verbalized understanding.

## 2014-02-24 NOTE — Plan of Care (Signed)
Problem: Phase I Progression Outcomes Goal: Pain controlled with appropriate interventions Outcome: Progressing     

## 2014-02-24 NOTE — Progress Notes (Signed)
Pt admitted from ED to 1344 at 1600, accompanied by mother and sister. Dressing change performed to left PICC site as scheduled. Medicated for back pain with percocet 10/325, effective. MD verbalized consent to use PICC line for IVF. D/C'd PIV in left hand placed in ED, due to extremity being restricted. Hortencia Conradi RN

## 2014-02-24 NOTE — Plan of Care (Signed)
Problem: Phase I Progression Outcomes Goal: Tolerating diet Outcome: Progressing     

## 2014-02-24 NOTE — ED Provider Notes (Addendum)
CSN: [id number]     Arrival date & time 02/24/14  1016 History   First MD Initiated Contact with Patient 02/24/14 1023     Chief Complaint  Patient presents with  . Fever  . Dysuria     HPI  Patient presents from her cancer Center with concerns of fever, listlessness. The patient denies confusion or disorientation.  She states that over the past days she has had polyuria without abdominal pain.  She denies dysuria.  Today the patient was found to be febrile, 104, sent here for evaluation.  Patient was febrile yesterday, had decreased with Tylenol.  He has a notable history of metastatic uterine cancer, is currently receiving chemotherapy through a left upper arm PICC line.  last chemotherapy was 2 weeks ago.  Patient states that she has no dyspnea, chest pain.  Patient is pleasant, appropriately interactive.  Past Medical History  Diagnosis Date  . Obesity   . DVT (deep venous thrombosis) 2009    RLE, tx for ~6 months  . PONV (postoperative nausea and vomiting)   . Gallstones   . Ascites   . Uterine cancer 2010    Dr. Humphrey Rolls, chemotherapy  . Family history of anesthesia complication     MOTHER IS DIFFICULT TO WAKE  . Gallstones   . Cellulitis of right lower extremity 03/13/2013  . Iron deficiency anemia 03/12/2013  . Compression fracture of L2 04/14/2013   Past Surgical History  Procedure Laterality Date  . Ankle surgery  1990  . Wisdom tooth extraction    . Tonsillectomy and adenoidectomy    . Abdominal hysterectomy  12/2009    [facility], [facility]  . Laparoscopy N/A 10/02/2012    Procedure: LAPAROSCOPY DIAGNOSTIC, perocentisis, omental biopsy;  Surgeon: Imogene Burn. Tsuei, MD;  Location: [facility];  Service: General;  Laterality: N/A;  removed a total of 15,000 of acities  . Paracentesis    . Kyphoplasty  02/26/2013   Family History  Problem Relation Age of Onset  . Heart disease Father   . Diabetes Brother   . Heart disease Mother   . Cirrhosis Father   . Diabetes Father    . Thyroid disease Mother   . Diabetes Mother   . Kidney disease Brother     from diabetes  . Breast cancer Maternal Aunt   . Breast cancer Maternal Aunt   . Melanoma Maternal Aunt   . Melanoma Maternal Aunt   . Other Maternal Aunt     lupus anticardiolipin ab syndrome   History  Substance Use Topics  . Smoking status: Never Smoker   . Smokeless tobacco: Never Used  . Alcohol Use: Yes     Comment: social, Occassionally   OB History    No data available     Review of Systems  Constitutional:       Per HPI, otherwise negative  HENT:       Per HPI, otherwise negative  Respiratory:       Per HPI, otherwise negative  Cardiovascular:       Per HPI, otherwise negative  Gastrointestinal: Negative for vomiting.  Endocrine:       Negative aside from HPI  Genitourinary:       Neg aside from HPI   Musculoskeletal:       Per HPI, otherwise negative  Skin: Negative.   Neurological: Positive for weakness. Negative for syncope.      Allergies  Review of patient's allergies indicates no known allergies.  Home Medications  Prior to Admission medications   Medication Sig Start Date End Date Taking? Authorizing Provider  Ascorbic Acid (VITAMIN C) 1000 MG tablet Take 1,000 mg by mouth daily.   Yes Historical Provider, MD  b complex vitamins capsule Take 1 capsule by mouth daily.   Yes Historical Provider, MD  calcium carbonate (OS-CAL) 600 MG TABS tablet Take 600 mg by mouth daily.   Yes Historical Provider, MD  Cholecalciferol (VITAMIN D3) 2000 UNITS TABS Take 1 tablet by mouth daily.   Yes Historical Provider, MD  Cyanocobalamin (VITAMIN B-12 PO) Take 2,500 mcg by mouth daily.    Yes Historical Provider, MD  cyclobenzaprine (FLEXERIL) 5 MG tablet Take 1 tablet (5 mg total) by mouth 3 (three) times daily as needed for muscle spasms. 12/09/13  Yes Lennis Marion Downer, MD  ferrous sulfate 325 (65 FE) MG tablet Take 325 mg by mouth 3 (three) times daily with meals.   Yes Historical  Provider, MD  folic acid (FOLVITE) 060 MCG tablet Take 400 mcg by mouth daily.     Yes Historical Provider, MD  fondaparinux (ARIXTRA) 10 MG/0.8ML SOLN injection Inject 10 mg into the skin daily.   Yes Historical Provider, MD  furosemide (LASIX) 20 MG tablet Take 1 tablet (20 mg total) by mouth every other day. 03/16/13  Yes Kinnie Feil, MD  gabapentin (NEURONTIN) 100 MG capsule Take 2 capsules (200 mg total) by mouth 3 (three) times daily. 12/25/13  Yes Lennis Marion Downer, MD  heparin lock flush 100 UNIT/ML SOLN injection Inject 250 Units into the vein See admin instructions. Flush each Lumen with 250 units 2.33ml daily   Yes Historical Provider, MD  loratadine (CLARITIN) 10 MG tablet Take 10 mg by mouth daily.   Yes Historical Provider, MD  magnesium gluconate (MAGONATE) 500 MG tablet Take 500 mg by mouth daily.   Yes Historical Provider, MD  miconazole nitrate (MICATIN) POWD Apply 1 application topically 4 (four) times daily. 02/10/14  Yes Lennis Marion Downer, MD  Multiple Vitamins-Minerals (MULTIVITAMIN WITH MINERALS) tablet Take 1 tablet by mouth daily.    Yes Historical Provider, MD  nystatin ointment (MYCOSTATIN) Apply 1 application topically 2 (two) times daily. 03/10/13  Yes Vivien Rota, NP  ondansetron (ZOFRAN ODT) 8 MG disintegrating tablet Take 1 tablet (8 mg total) by mouth every 8 (eight) hours as needed for nausea or vomiting. 05/28/13  Yes Wilmon Arms, MD  ondansetron (ZOFRAN) 8 MG tablet Take 8 mg by mouth 2 (two) times daily as needed for nausea or vomiting.   Yes Historical Provider, MD  oxyCODONE-acetaminophen (PERCOCET) 10-325 MG per tablet Take 1 tablet by mouth every 4 (four) hours as needed for pain. 12/30/13  Yes Minette Headland, NP  pantoprazole (PROTONIX) 40 MG tablet Take 40 mg by mouth daily.   Yes Historical Provider, MD  potassium chloride (K-DUR) 10 MEQ tablet Take 2 tablets (20 mEq total) by mouth daily. 05/01/13  Yes Minette Headland, NP  prochlorperazine  (COMPAZINE) 10 MG tablet Take 10 mg by mouth every 6 (six) hours as needed for nausea or vomiting.   Yes Historical Provider, MD  promethazine (PHENERGAN) 25 MG tablet Take 12.5 mg by mouth every 6 (six) hours as needed for nausea or vomiting.   Yes Historical Provider, MD  sucralfate (CARAFATE) 1 GM/10ML suspension Take 1 g by mouth 4 (four) times daily.   Yes Historical Provider, MD  ferrous sulfate 325 (65 FE) MG tablet TAKE 1 TABLET BY MOUTH 3 TIMES  A DAY WITH MEALS 11/18/13   Lennis Marion Downer, MD  ondansetron (ZOFRAN) 8 MG tablet TAKE 1 TABLET BY MOUTH TWICE A DAY AS NEEDED START DAY AFTER CHEMO 01/29/14   Lennis P Livesay, MD   BP 90/63 mmHg  Pulse 103  Temp(Src) 100 F (37.8 C) (Oral)  Resp 36  Wt 423 lb (191.872 kg)  SpO2 95% Physical Exam  Constitutional: She is oriented to person, place, and time. She appears well-developed and well-nourished.  Morbidly obese female resting  HENT:  Head: Normocephalic and atraumatic.  Eyes: Conjunctivae and EOM are normal.  Cardiovascular: Normal rate and regular rhythm.   Pulmonary/Chest: No stridor. No tachypnea. She has decreased breath sounds.  Abdominal: She exhibits no distension.  Large abdomen, soft, nontender  Musculoskeletal: She exhibits no edema.  Neurological: She is alert and oriented to person, place, and time. No cranial nerve deficit.  Skin: Skin is warm and dry.     Psychiatric: She has a normal mood and affect.  Nursing note and vitals reviewed.   ED Course  Procedures (including critical care time) Labs Review Labs Reviewed  CBC WITH DIFFERENTIAL - Abnormal; Notable for the following:    RBC 3.62 (*)    MCV 107.2 (*)    MCH 35.9 (*)    Platelets 105 (*)    Neutrophils Relative % 83 (*)    Lymphocytes Relative 6 (*)    Lymphs Abs 0.5 (*)    All other components within normal limits  COMPREHENSIVE METABOLIC PANEL - Abnormal; Notable for the following:    Sodium 133 (*)    Potassium 3.5 (*)    Chloride 92 (*)      Glucose, Bld 140 (*)    Albumin 3.4 (*)    Anion gap 17 (*)    All other components within normal limits  URINALYSIS, ROUTINE W REFLEX MICROSCOPIC - Abnormal; Notable for the following:    Color, Urine ORANGE (*)    APPearance CLOUDY (*)    Hgb urine dipstick LARGE (*)    Bilirubin Urine SMALL (*)    Protein, ur 100 (*)    Nitrite POSITIVE (*)    Leukocytes, UA MODERATE (*)    All other components within normal limits  URINE MICROSCOPIC-ADD ON - Abnormal; Notable for the following:    Bacteria, UA MANY (*)    Casts GRANULAR CAST (*)    All other components within normal limits  I-STAT CG4 LACTIC ACID, ED - Abnormal; Notable for the following:    Lactic Acid, Venous 2.69 (*)    All other components within normal limits  CULTURE, BLOOD (ROUTINE X 2)  CULTURE, BLOOD (ROUTINE X 2)  URINE CULTURE  LIPASE, BLOOD  PROCALCITONIN  I-STAT CG4 LACTIC ACID, ED    Imaging Review Dg Chest Port 1 View  02/24/2014   CLINICAL DATA:  48 year old female with high fever ranging from 104 to 107 since yesterday. Recent PICC placement.  EXAM: PORTABLE CHEST - 1 VIEW  COMPARISON:  Chest CT 08/03/2013.  Chest x-ray 03/12/2013.  FINDINGS: There is a left upper extremity PICC with tip terminating in the right atrium, approximately 3.6 cm distal to the superior cavoatrial junction. Lung volumes are normal. No acute consolidative airspace disease. No pleural effusions. No pneumothorax. No evidence of pulmonary edema. Heart size is mildly enlarged. The patient is rotated to the right on today's exam, resulting in distortion of the mediastinal contours and reduced diagnostic sensitivity and specificity for mediastinal pathology.  IMPRESSION: 1.  No radiographic evidence of acute cardiopulmonary disease. 2. Left upper extremity PICC terminates in the right atrium approximately 3.6 cm distal to the superior cavoatrial junction.   Electronically Signed   By: Vinnie Langton M.D.   On: 02/24/2014 10:57     Cardiac  110 sinus tach abnormal Pulse oximetry 99% room air normal  After the initial evaluation I reviewed the patient's chart.  On repeat exam the patient appears calm. She has received 2 L IV fluids. Patient remains oral and hypotensive, though she states this is typical for her.  MDM  Patient presents from her oncologist's office with concern of fever, polyuria. Patient is awake and alert.  Patient's labs initially notable for hypotension, tachypnea, tachycardia. Patient's blood pressure improved following initial fluid resuscitation,and she remained awake and alert throughout. With mild lactic acidosis, concern given the patient's history, patient was admitted for further evaluation and management.    Carmin Muskrat, MD 02/24/14 1102  Carmin Muskrat, MD 02/24/14 [phone]

## 2014-02-25 DIAGNOSIS — I82409 Acute embolism and thrombosis of unspecified deep veins of unspecified lower extremity: Secondary | ICD-10-CM

## 2014-02-25 DIAGNOSIS — D6481 Anemia due to antineoplastic chemotherapy: Secondary | ICD-10-CM

## 2014-02-25 DIAGNOSIS — R509 Fever, unspecified: Secondary | ICD-10-CM

## 2014-02-25 DIAGNOSIS — D638 Anemia in other chronic diseases classified elsewhere: Secondary | ICD-10-CM

## 2014-02-25 DIAGNOSIS — I82629 Acute embolism and thrombosis of deep veins of unspecified upper extremity: Secondary | ICD-10-CM

## 2014-02-25 DIAGNOSIS — K219 Gastro-esophageal reflux disease without esophagitis: Secondary | ICD-10-CM

## 2014-02-25 LAB — BASIC METABOLIC PANEL
Anion gap: 13 (ref 5–15)
BUN: 15 mg/dL (ref 6–23)
CHLORIDE: 96 meq/L (ref 96–112)
CO2: 25 mEq/L (ref 19–32)
CREATININE: 0.76 mg/dL (ref 0.50–1.10)
Calcium: 8.3 mg/dL — ABNORMAL LOW (ref 8.4–10.5)
GFR calc non Af Amer: >90 mL/min (ref >90)
GLUCOSE: 126 mg/dL — AB (ref 70–99)
POTASSIUM: 3.3 meq/L — AB (ref 3.7–5.3)
Sodium: 134 mEq/L — ABNORMAL LOW (ref 137–147)

## 2014-02-25 LAB — CBC
HCT: 32.3 % — ABNORMAL LOW (ref 36.0–46.0)
Hemoglobin: 11.2 g/dL — ABNORMAL LOW (ref 12.0–15.0)
MCH: 36.5 pg — AB (ref 26.0–34.0)
MCHC: 34.7 g/dL (ref 30.0–36.0)
MCV: 105.2 fL — AB (ref 78.0–100.0)
Platelets: 94 10*3/uL — ABNORMAL LOW (ref 150–400)
RBC: 3.07 MIL/uL — AB (ref 3.87–5.11)
RDW: 15.2 % (ref 11.5–15.5)
WBC: 10.8 10*3/uL — ABNORMAL HIGH (ref 4.0–10.5)

## 2014-02-25 MED ORDER — SODIUM CHLORIDE 0.9 % IV SOLN
INTRAVENOUS | Status: AC
  Administered 2014-02-25: 21:00:00 via INTRAVENOUS

## 2014-02-25 NOTE — Care Management Note (Signed)
CARE MANAGEMENT NOTE 02/25/2014  Patient:  [name], [name]   Account Number:  [id number]  Date Initiated:  02/25/2014  Documentation initiated by:  Marney Doctor  Subjective/Objective Assessment:   48 yo admitted with sepsis. Hx of uterine CA, ascites, and DVT     Action/Plan:   From home with Mom   Anticipated DC Date:  03/01/2014   Anticipated DC Plan:  [place]  CM consult      Choice offered to / List presented to:             Status of service:  In process, will continue to follow Medicare Important Message given?   (If response is "NO", the following Medicare IM given date fields will be blank) Date Medicare IM given:   Medicare IM given by:   Date Additional Medicare IM given:   Additional Medicare IM given by:    Discharge Disposition:    Per UR Regulation:  Reviewed for med. necessity/level of care/duration of stay  If discussed at [place] of Stay Meetings, dates discussed:    Comments:  02/25/14 Marney Doctor RN,BSN,NCM [phone] Chart reviewed and CM following for DC needs.

## 2014-02-25 NOTE — Progress Notes (Signed)
[facility] PROGRESS NOTE  [name] KPT:[id number] DOB: [birth year] DOA: 02/24/2014  PCP: Orpah Melter, MD  Brief HPI: [name] is an 48 y.o. female with a PMH of DVT on chronic anticoagulation, metastatic uterine cancer receiving chemotherapy (received Carboplatin, Emend and decadron on 02/10/14 followed by Neulasta support on 02/11/14) who awoke the morning of admission with shaking chills and fevers with a home temperature reading of 107.The patient reported having to urinate every 1-2 hours, with urgency and urinary incontinence.Upon initial evaluation in the ER, the patient was found to have an unremarkable CXR, no leukocytosis, low grade fever of 100. Urinalysis was positive for nitrites and leukocytes.  Past medical history:  Past Medical History  Diagnosis Date  . Obesity   . DVT (deep venous thrombosis) 2009    RLE, tx for ~6 months  . PONV (postoperative nausea and vomiting)   . Gallstones   . Ascites   . Uterine cancer 2010    Dr. Humphrey Rolls, chemotherapy  . Family history of anesthesia complication     MOTHER IS DIFFICULT TO WAKE  . Gallstones   . Cellulitis of right lower extremity 03/13/2013  . Iron deficiency anemia 03/12/2013  . Compression fracture of L2 04/14/2013    Consultants: Oncology  Procedures: None  Antibiotics: Cefepime 11/4-->  Subjective: Patient feels better. Has mild frontal headache. Denies cough, shortness of breath, chest pain, nausea/vomiting, abdominal pain. No further dysuria. No skin rashes, joint pains.  Objective: Vital Signs  Filed Vitals:   02/25/14 0521 02/25/14 0700 02/25/14 1117 02/25/14 1442  BP: 111/52   107/53  Pulse: 113   100  Temp: 103.1 F (39.5 C) 99.1 F (37.3 C) 99.7 F (37.6 C) 101.7 F (38.7 C)  TempSrc: Oral Oral Oral Oral  Resp: 32   20  Height:      Weight:      SpO2: 97%   100%    Intake/Output Summary (Last 24 hours) at 02/25/14 1623 Last data filed at 02/25/14 1016  Gross per  24 hour  Intake   2169 ml  Output   1500 ml  Net    669 ml   Filed Weights   02/24/14 1221 02/24/14 1630  Weight: 191.872 kg (423 lb) 191.872 kg (423 lb)    General appearance: alert, cooperative, appears stated age, no distress and morbidly obese Head: Normocephalic, without obvious abnormality, atraumatic Resp: clear to auscultation bilaterally Cardio: regular rate and rhythm, S1, S2 normal, no murmur, click, rub or gallop GI: Morbidly obese, soft, non-tender; bowel sounds normal; no masses,  no organomegaly Extremities: extremities normal, atraumatic, no cyanosis or edema  Lab Results:  Basic Metabolic Panel:  Recent Labs Lab 02/24/14 1105 02/25/14 0410  NA 133* 134*  K 3.5* 3.3*  CL 92* 96  CO2 24 25  GLUCOSE 140* 126*  BUN 15 15  CREATININE 0.77 0.76  CALCIUM 9.2 8.3*   Liver Function Tests:  Recent Labs Lab 02/24/14 1105  AST 37  ALT 34  ALKPHOS 100  BILITOT 1.0  PROT 7.3  ALBUMIN 3.4*    Recent Labs Lab 02/24/14 1105  LIPASE 11   CBC:  Recent Labs Lab 02/24/14 1105 02/25/14 0410  WBC 7.8 10.8*  NEUTROABS 6.5  --   HGB 13.0 11.2*  HCT 38.8 32.3*  MCV 107.2* 105.2*  PLT 105* 94*   BNP (last 3 results)  Recent Labs  03/12/13 0845  PROBNP 2322.0*     Recent Results (from the past  240 hour(s))  Blood Culture (routine x 2)     Status: None (Preliminary result)   Collection Time: 02/24/14 11:05 AM  Result Value Ref Range Status   Specimen Description BLOOD LEFT WRIST  Final   Special Requests BOTTLES DRAWN AEROBIC AND ANAEROBIC 4ML  Final   Culture  Setup Time   Final    02/24/2014 15:42 Performed at [facility]    Culture   Final           BLOOD CULTURE RECEIVED NO GROWTH TO DATE CULTURE WILL BE HELD FOR 5 DAYS BEFORE ISSUING A FINAL NEGATIVE REPORT Performed at [facility]    Report Status PENDING  Incomplete  Blood Culture (routine x 2)     Status: None (Preliminary result)   Collection Time: 02/24/14  4:15  PM  Result Value Ref Range Status   Specimen Description BLOOD RIGHT ARM  Final   Special Requests BOTTLES DRAWN AEROBIC ONLY 10CC  Final   Culture  Setup Time   Final    02/24/2014 21:22 Performed at [facility]    Culture   Final           BLOOD CULTURE RECEIVED NO GROWTH TO DATE CULTURE WILL BE HELD FOR 5 DAYS BEFORE ISSUING A FINAL NEGATIVE REPORT Performed at [facility]    Report Status PENDING  Incomplete      Studies/Results: Dg Chest Port 1 View  02/24/2014   CLINICAL DATA:  48 year old female with high fever ranging from 104 to 107 since yesterday. Recent PICC placement.  EXAM: PORTABLE CHEST - 1 VIEW  COMPARISON:  Chest CT 08/03/2013.  Chest x-ray 03/12/2013.  FINDINGS: There is a left upper extremity PICC with tip terminating in the right atrium, approximately 3.6 cm distal to the superior cavoatrial junction. Lung volumes are normal. No acute consolidative airspace disease. No pleural effusions. No pneumothorax. No evidence of pulmonary edema. Heart size is mildly enlarged. The patient is rotated to the right on today's exam, resulting in distortion of the mediastinal contours and reduced diagnostic sensitivity and specificity for mediastinal pathology.  IMPRESSION: 1. No radiographic evidence of acute cardiopulmonary disease. 2. Left upper extremity PICC terminates in the right atrium approximately 3.6 cm distal to the superior cavoatrial junction.   Electronically Signed   By: Vinnie Langton M.D.   On: 02/24/2014 10:57    Medications:  Scheduled: . sodium chloride   Intravenous STAT  . B-complex with vitamin C  1 tablet Oral Daily  . calcium carbonate  1 tablet Oral Q breakfast  . ceFEPime (MAXIPIME) IV  1 g Intravenous 3 times per day  . cholecalciferol  2,000 Units Oral Daily  . cyanocobalamin 500 mcg, vitamin B-12 (CYANOCOBALAMIN) 2,000 mcg  2,500 mcg Oral Daily  . ferrous sulfate  325 mg Oral TID WC  . folic acid  0.5 mg Oral Daily  . fondaparinux   10 mg Subcutaneous Daily  . gabapentin  200 mg Oral TID  . loratadine  10 mg Oral Daily  . magnesium gluconate  500 mg Oral Daily  . miconazole nitrate  1 application Topical QID  . multivitamin with minerals  1 tablet Oral Daily  . pantoprazole  40 mg Oral Daily  . potassium chloride  20 mEq Oral Daily   Continuous:  UYQ:[id number] **OR** acetaminophen, alum & mag hydroxide-simeth, cyclobenzaprine, oxyCODONE-acetaminophen **AND** oxyCODONE, promethazine  Assessment/Plan:  Principal Problem:   Sepsis secondary to UTI Active Problems:   Obesity - BMI  31   Chronic cholecystitis with calculus   Endometrial cancer   UTI (urinary tract infection)   DVT of upper extremity (deep vein thrombosis)   Chemotherapy-induced neuropathy    Sepsis secondary to UTI Stable but continues to have fever. Cultures pending. Continue Cefepime. Lactic acid is normal today. Continue IVF.  Endometrial cancer Diagnosed 06/2007, Status post robotic hysterectomy and bilateral salpingo-oophorectomies 12/2009. 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 Dr. Marko Plume following.   DVT of upper extremity (deep vein thrombosis) Continue Arixtra.  Chemotherapy-induced neuropathy Continue Neurontin.  Mild Thrombocytopenia Likely due to chemo. Monitor.  Macrocytic Anemia Slight drop in Hgb noted. Likely due to dilution. Monitor for now.  Obesity - BMI 79 Has been in diet counseling therapy in the past.   DVT Prophylaxis: On Arixtra    Code Status: Full Code  Family Communication: Discussed with patient  Disposition Plan: Not ready for discharge. Mobilize when better.    LOS: 1 day   [facility] Pager [phone] 02/25/2014, 4:23 PM  If 8PM-8AM, please contact night-coverage at [email or web address], password [email or web address]

## 2014-02-25 NOTE — Progress Notes (Signed)
02/25/2014, 9:11 AM  Hospital day 2 Antibiotics: Maxipime Chemotherapy: last cycle 9 single agent carboplatin 02-10-14.   Physicians PTA: W.[place], L.Janett Billow, M.[place] hospitalist service letting me know of admission 02-24-14, with fever >=104 overnight prior, and rigors on admission. I have followed [name] for medical oncology since 07-2011 for advanced endometrial cancer. She had C diff diarrhea ~ June 2015 and E coli UTI 02-2013. She has PICC LUE placed by IR 03-2013, which mother flushes at home + dressing changes weekly at [facility]. She is on Arixtra since DVT with previous PAC 03-2013, also with morbid obesity/ immobility/ active gyn cancer.  EMR reviewed and I have discussed with admitting hospitalist MD on unit; patient seen with aunt here.  Subjective: Had felt well over past weekend, then nonspecifically not as well on 11-2, without fever. She had urinray frequency 11-3, without other symptoms, then temperature to 104 that night. Family contacted [place] 11-4 AM, instructed to go to ED.  Feeling some better this AM, tho very diaphoretic during night. Voiding larger amounts. Some nausea without vomiting, but has tolerated po's this AM. No diarrhea. Denies SOB supine on RA. PICC functioning without difficulty, no discomfort there. No new or different pain. Less swelling LE.   Note activity level baseline is walking from bedroom to table for meals, and sometimes sitting up in den. Mother is primary caregiver, with aunt assisting. Father also at home, with medical problems.     She has PICC.  She had flu vaccine   ONCOLOGIC HISTORY Patient developed excessive uterine bleeding. Dilatation and curettage with hysteroscopy documented grade 1 endometrioid endometrial adenocarcinoma. Due to morbid obesity Mirena IUD was placed along with Aygestin. Patient had a dramatic directed weight loss with nutritional support she went from 523 pounds to 359  pounds in 10/2009. On 12/27/2009 she underwent a robotic-assisted laparoscopic hysterectomy, bilateral salpingo-oophorectomy, and mini laparotomy through the umbilical port with morcellation of uterus within about the delivery of the uterus. The final pathology revealed an endometrial adenocarcinoma grade 2 with invasion limited to 1 mm of the myometrium.  In 09/2012 when she developed right upper quadrant pain, presumed cholelithiasis, with laparoscopic evaluation finding peritoneal carcinomatosis and ascites. Omental biopsies had metastatic adenocarcinoma consistent with recurrent endometrial carcinoma. She had 12 cycles taxol carboplatin from 11-04-12 thru 07-24-13 with neulasta day 2. She initially required multiple paracenteses for ascites, none in months now. CT AP after cycle 12 showed stable to minimally improved disease. With progressive neuropathy, she has continued single agent carboplatin, cycle 9 on 02-11-14. Repeat scans were planned after cycle 9 due to probable slight carboplatin reaction just after than infusion completed -- see my note 02-10-14.   Objective: Vital signs in last 24 hours: Blood pressure 111/52, pulse 113, temperature 99.1 F (37.3 C), temperature source Oral, resp. rate 32, height 5\' 11"  (1.803 m), weight 423 lb (191.872 kg), SpO2 97 %. Awake, alert, looks fatigued but not in acute distress now, supine on RA. No rigors. PERRL, not icteric. Oral mucosa moist. Heart RRR no gallop. Respirations seems somewhat labored, likely with positioning and morbid obesity. PICC site LUE without erythema, tenderness, swelling, infusing easily. Peripheral IV RUE.Abdomen obese, soft, not tender including epigastrium. Lower legs with chronic venous stasis discoloration but no pitting edema. Feet warm. Moves all extremities in bed. PSYCH appropriate mood and affect. Intake/Output from previous day: 11/04 0701 - 11/05 0700 In: 1929 [P.O.:480; IV Piggyback:150] Out: 1300 [Urine:1300]   Lab  Results:  Recent  Labs  02/24/14 1105 02/25/14 0410  WBC 7.8 10.8*  HGB 13.0 11.2*  HCT 38.8 32.3*  PLT 105* 94*   BMET  Recent Labs  02/24/14 1105 02/25/14 0410  NA 133* 134*  K 3.5* 3.3*  CL 92* 96  CO2 24 25  GLUCOSE 140* 126*  BUN 15 15  CREATININE 0.77 0.76  CALCIUM 9.2 8.3*   Urine and blood cultures pending  Studies/Results: Dg Chest Port 1 View  02/24/2014   CLINICAL DATA:  48 year old female with high fever ranging from 104 to 107 since yesterday. Recent PICC placement.  EXAM: PORTABLE CHEST - 1 VIEW  COMPARISON:  Chest CT 08/03/2013.  Chest x-ray 03/12/2013.  FINDINGS: There is a left upper extremity PICC with tip terminating in the right atrium, approximately 3.6 cm distal to the superior cavoatrial junction. Lung volumes are normal. No acute consolidative airspace disease. No pleural effusions. No pneumothorax. No evidence of pulmonary edema. Heart size is mildly enlarged. The patient is rotated to the right on today's exam, resulting in distortion of the mediastinal contours and reduced diagnostic sensitivity and specificity for mediastinal pathology.  IMPRESSION: 1. No radiographic evidence of acute cardiopulmonary disease. 2. Left upper extremity PICC terminates in the right atrium approximately 3.6 cm distal to the superior cavoatrial junction.   Electronically Signed   By: Vinnie Langton M.D.   On: 02/24/2014 10:57     Assessment/Plan: 1,Fever, chills in patient immunocompromised by ongoing chemotherapy, tho not neutropenic. Central line in (PICC), urinary tract symptoms. Clinically better on empiric maxipime, awaiting cultures.  2.metastatic endometrial carcinoma with primarily pelvic involvement by last CT AP 07-2013. She has been on chemotherapy almost continuously since 10-2012, in attempt to control disease. Plan has been to repeat scans upcoming as she likely had slight carboplatin reaction at last treatment 02-10-14. 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 Please call between my rounds if needed  Gordy Levan  MD Pager [phone] Office [phone]

## 2014-02-26 DIAGNOSIS — D696 Thrombocytopenia, unspecified: Secondary | ICD-10-CM

## 2014-02-26 DIAGNOSIS — E876 Hypokalemia: Secondary | ICD-10-CM | POA: Insufficient documentation

## 2014-02-26 LAB — BASIC METABOLIC PANEL
Anion gap: 13 (ref 5–15)
BUN: 11 mg/dL (ref 6–23)
CHLORIDE: 94 meq/L — AB (ref 96–112)
CO2: 27 mEq/L (ref 19–32)
CREATININE: 0.73 mg/dL (ref 0.50–1.10)
Calcium: 8.2 mg/dL — ABNORMAL LOW (ref 8.4–10.5)
GFR calc non Af Amer: >90 mL/min (ref >90)
Glucose, Bld: 122 mg/dL — ABNORMAL HIGH (ref 70–99)
Potassium: 2.9 mEq/L — CL (ref 3.7–5.3)
Sodium: 134 mEq/L — ABNORMAL LOW (ref 137–147)

## 2014-02-26 LAB — CBC
HEMATOCRIT: 29.6 % — AB (ref 36.0–46.0)
Hemoglobin: 10.1 g/dL — ABNORMAL LOW (ref 12.0–15.0)
MCH: 35.4 pg — ABNORMAL HIGH (ref 26.0–34.0)
MCHC: 34.1 g/dL (ref 30.0–36.0)
MCV: 103.9 fL — AB (ref 78.0–100.0)
PLATELETS: 89 10*3/uL — AB (ref 150–400)
RBC: 2.85 MIL/uL — ABNORMAL LOW (ref 3.87–5.11)
RDW: 14.7 % (ref 11.5–15.5)
WBC: 5.5 10*3/uL (ref 4.0–10.5)

## 2014-02-26 MED ORDER — VITAMIN B-12 1000 MCG PO TABS
2500.0000 ug | ORAL_TABLET | Freq: Every day | ORAL | Status: DC
  Administered 2014-02-26 – 2014-03-01 (×4): 2500 ug via ORAL
  Filled 2014-02-26 (×4): qty 1

## 2014-02-26 MED ORDER — FLUCONAZOLE 100 MG PO TABS
100.0000 mg | ORAL_TABLET | Freq: Every day | ORAL | Status: DC
  Administered 2014-02-26 – 2014-03-01 (×4): 100 mg via ORAL
  Filled 2014-02-26 (×4): qty 1

## 2014-02-26 MED ORDER — POTASSIUM CHLORIDE ER 10 MEQ PO TBCR
40.0000 meq | EXTENDED_RELEASE_TABLET | Freq: Two times a day (BID) | ORAL | Status: DC
  Administered 2014-02-26 – 2014-03-01 (×7): 40 meq via ORAL
  Filled 2014-02-26 (×9): qty 4

## 2014-02-26 NOTE — Progress Notes (Addendum)
[facility] PROGRESS NOTE  [name]:[id number] DOB: Mar 06, 1966 DOA: 02/24/2014  PCP: Orpah Melter, MD  Brief HPI: [name] is an 48 y.o. female with a PMH of DVT on chronic anticoagulation, metastatic uterine cancer receiving chemotherapy (received Carboplatin, Emend and decadron on 02/10/14 followed by Neulasta support on 02/11/14) who awoke the morning of admission with shaking chills and fevers with a home temperature reading of 107.The patient reported having to urinate every 1-2 hours, with urgency and urinary incontinence.Upon initial evaluation in the ER, the patient was found to have an unremarkable CXR, no leukocytosis, low grade fever of 100. Urinalysis was positive for nitrites and leukocytes.  Past medical history:  Past Medical History  Diagnosis Date  . Obesity   . DVT (deep venous thrombosis) 2009    RLE, tx for ~6 months  . PONV (postoperative nausea and vomiting)   . Gallstones   . Ascites   . Uterine cancer 2010    Dr. Humphrey Rolls, chemotherapy  . Family history of anesthesia complication     MOTHER IS DIFFICULT TO WAKE  . Gallstones   . Cellulitis of right lower extremity 03/13/2013  . Iron deficiency anemia 03/12/2013  . Compression fracture of L2 04/14/2013    Consultants: Oncology  Procedures: None  Antibiotics: Cefepime 11/4-->  Subjective: Patient feels well. Has a rash in right lower abdominal fold. No nausea or vomiting.   Objective: Vital Signs  Filed Vitals:   02/25/14 1117 02/25/14 1442 02/25/14 2108 02/26/14 0400  BP:  107/53 112/61   Pulse:  100 104   Temp: 99.7 F (37.6 C) 101.7 F (38.7 C) 100 F (37.8 C) 100.4 F (38 C)  TempSrc: Oral Oral Oral Oral  Resp:  20 24   Height:      Weight:      SpO2:  100% 96%     Intake/Output Summary (Last 24 hours) at 02/26/14 0956 Last data filed at 02/26/14 7672  Gross per 24 hour  Intake 2060.83 ml  Output   2525 ml  Net -464.17 ml   Filed Weights   02/24/14  1221 02/24/14 1630  Weight: 191.872 kg (423 lb) 191.872 kg (423 lb)    General appearance: alert, cooperative, appears stated age, no distress and morbidly obese Resp: clear to auscultation bilaterally Cardio: regular rate and rhythm, S1, S2 normal, no murmur, click, rub or gallop GI: Morbidly obese, soft, non-tender; bowel sounds normal; no masses,  no organomegaly Skin: excoriation noted in right lower abdominal fold.  Lab Results:  Basic Metabolic Panel:  Recent Labs Lab 02/24/14 1105 02/25/14 0410 02/26/14 0520  NA 133* 134* 134*  K 3.5* 3.3* 2.9*  CL 92* 96 94*  CO2 24 25 27   GLUCOSE 140* 126* 122*  BUN 15 15 11   CREATININE 0.77 0.76 0.73  CALCIUM 9.2 8.3* 8.2*   Liver Function Tests:  Recent Labs Lab 02/24/14 1105  AST 37  ALT 34  ALKPHOS 100  BILITOT 1.0  PROT 7.3  ALBUMIN 3.4*    Recent Labs Lab 02/24/14 1105  LIPASE 11   CBC:  Recent Labs Lab 02/24/14 1105 02/25/14 0410 02/26/14 0520  WBC 7.8 10.8* 5.5  NEUTROABS 6.5  --   --   HGB 13.0 11.2* 10.1*  HCT 38.8 32.3* 29.6*  MCV 107.2* 105.2* 103.9*  PLT 105* 94* 89*   BNP (last 3 results)  Recent Labs  03/12/13 0845  PROBNP 2322.0*     Recent Results (from the past  240 hour(s))  Blood Culture (routine x 2)     Status: None (Preliminary result)   Collection Time: 02/24/14 11:05 AM  Result Value Ref Range Status   Specimen Description BLOOD LEFT WRIST  Final   Special Requests BOTTLES DRAWN AEROBIC AND ANAEROBIC 4ML  Final   Culture  Setup Time   Final    02/24/2014 15:42 Performed at [facility]    Culture   Final           BLOOD CULTURE RECEIVED NO GROWTH TO DATE CULTURE WILL BE HELD FOR 5 DAYS BEFORE ISSUING A FINAL NEGATIVE REPORT Performed at [facility]    Report Status PENDING  Incomplete  Urine culture     Status: None (Preliminary result)   Collection Time: 02/24/14 12:21 PM  Result Value Ref Range Status   Specimen Description URINE, CLEAN CATCH   Final   Special Requests Normal  Final   Culture  Setup Time   Final    02/24/2014 19:13 Performed at [place]   Final    >=100,000 COLONIES/ML Performed at [facility]    Culture   Final    [place] Performed at [facility]    Report Status PENDING  Incomplete  Blood Culture (routine x 2)     Status: None (Preliminary result)   Collection Time: 02/24/14  4:15 PM  Result Value Ref Range Status   Specimen Description BLOOD RIGHT ARM  Final   Special Requests BOTTLES DRAWN AEROBIC ONLY 10CC  Final   Culture  Setup Time   Final    02/24/2014 21:22 Performed at [facility]    Culture   Final           BLOOD CULTURE RECEIVED NO GROWTH TO DATE CULTURE WILL BE HELD FOR 5 DAYS BEFORE ISSUING A FINAL NEGATIVE REPORT Performed at [facility]    Report Status PENDING  Incomplete      Studies/Results: Dg Chest Port 1 View  02/24/2014   CLINICAL DATA:  48 year old female with high fever ranging from 104 to 107 since yesterday. Recent PICC placement.  EXAM: PORTABLE CHEST - 1 VIEW  COMPARISON:  Chest CT 08/03/2013.  Chest x-ray 03/12/2013.  FINDINGS: There is a left upper extremity PICC with tip terminating in the right atrium, approximately 3.6 cm distal to the superior cavoatrial junction. Lung volumes are normal. No acute consolidative airspace disease. No pleural effusions. No pneumothorax. No evidence of pulmonary edema. Heart size is mildly enlarged. The patient is rotated to the right on today's exam, resulting in distortion of the mediastinal contours and reduced diagnostic sensitivity and specificity for mediastinal pathology.  IMPRESSION: 1. No radiographic evidence of acute cardiopulmonary disease. 2. Left upper extremity PICC terminates in the right atrium approximately 3.6 cm distal to the superior cavoatrial junction.   Electronically Signed   By: Vinnie Langton M.D.   On: 02/24/2014 10:57    Medications:   Scheduled: . sodium chloride   Intravenous STAT  . B-complex with vitamin C  1 tablet Oral Daily  . calcium carbonate  1 tablet Oral Q breakfast  . ceFEPime (MAXIPIME) IV  1 g Intravenous 3 times per day  . cholecalciferol  2,000 Units Oral Daily  . cyanocobalamin 500 mcg, vitamin B-12 (CYANOCOBALAMIN) 2,000 mcg  2,500 mcg Oral Daily  . ferrous sulfate  325 mg Oral TID WC  . fluconazole  100 mg Oral Daily  .  folic acid  0.5 mg Oral Daily  . fondaparinux  10 mg Subcutaneous Daily  . gabapentin  200 mg Oral TID  . loratadine  10 mg Oral Daily  . magnesium gluconate  500 mg Oral Daily  . miconazole nitrate  1 application Topical QID  . multivitamin with minerals  1 tablet Oral Daily  . pantoprazole  40 mg Oral Daily  . potassium chloride  40 mEq Oral BID   Continuous:  DXI:[id number] **OR** acetaminophen, alum & mag hydroxide-simeth, cyclobenzaprine, oxyCODONE-acetaminophen **AND** oxyCODONE, promethazine  Assessment/Plan:  Principal Problem:   Sepsis secondary to UTI Active Problems:   Obesity - BMI 79   Chronic cholecystitis with calculus   Endometrial cancer   UTI (urinary tract infection)   DVT of upper extremity (deep vein thrombosis)   Chemotherapy-induced neuropathy    Sepsis secondary to UTI Stable and appears to be improving. Urine growing GNR but final ID and sens is pending. Continue Cefepime. Continue IVF at lower rate.  Endometrial cancer Diagnosed 06/2007, Status post robotic hysterectomy and bilateral salpingo-oophorectomies 12/2009. 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 Dr. Marko Plume following.   DVT of upper extremity (deep vein thrombosis) Continue Arixtra.  Chemotherapy-induced neuropathy Continue Neurontin.  Mild Thrombocytopenia Likely due to chemo. Monitor.  Macrocytic Anemia Slight drop in Hgb noted. Likely due to dilution. Monitor for now.  Obesity - BMI 79 Has been in diet  counseling therapy in the past.  Tinea On miconazole powder. Add Diflucan.  Hypokalemia Replete  DVT Prophylaxis: On Arixtra    Code Status: Full Code  Family Communication: Discussed with patient  Disposition Plan: Not ready for discharge. Mobilize. PT/OT.    LOS: 2 days   [facility] Pager [phone] 02/26/2014, 9:56 AM  If 8PM-8AM, please contact night-coverage at [email or web address], password [email or web address]

## 2014-02-26 NOTE — Progress Notes (Signed)
CRITICAL VALUE ALERT  Critical value received:  Potassium 2.9  Date of notification:  02/26/14  Time of notification:  0736  Critical value read back:Yes.    Nurse who received alert:  Jena Gauss  MD notified (1st page):  [place] Pink  Time of first page:  [phone]  MD notified (2nd page):  Time of second page:  Responding MD:    Time MD responded:

## 2014-02-26 NOTE — Progress Notes (Signed)
ANTIBIOTIC CONSULT NOTE - INITIAL  Pharmacy Consult for Cefepime Indication: rule out sepsis  No Known Allergies  Patient Measurements: Height: 5\' 11"  (180.3 cm) Weight: (!) 423 lb (191.872 kg) IBW/kg (Calculated) : 70.8  Vital Signs: Temp: 100.4 F (38 C) (11/06 0400) Temp Source: Oral (11/06 0400) BP: 112/61 mmHg (11/05 2108) Pulse Rate: 104 (11/05 2108) Intake/Output from previous day: 11/05 0701 - 11/06 0700 In: 1820.8 [P.O.:780; I.V.:890.8; IV Piggyback:150] Out: 2250 [Urine:2250] Intake/Output from this shift:    Labs:  Recent Labs  02/24/14 1105 02/25/14 0410 02/26/14 0520  WBC 7.8 10.8* 5.5  HGB 13.0 11.2* 10.1*  PLT 105* 94* 89*  CREATININE 0.77 0.76 0.73   Estimated Creatinine Clearance: 165.3 mL/min (by C-G formula based on Cr of 0.73). No results for input(s): VANCOTROUGH, VANCOPEAK, VANCORANDOM, GENTTROUGH, GENTPEAK, GENTRANDOM, TOBRATROUGH, TOBRAPEAK, TOBRARND, AMIKACINPEAK, AMIKACINTROU, AMIKACIN in the last 72 hours.   Microbiology: Recent Results (from the past 720 hour(s))  Blood Culture (routine x 2)     Status: None (Preliminary result)   Collection Time: 02/24/14 11:05 AM  Result Value Ref Range Status   Specimen Description BLOOD LEFT WRIST  Final   Special Requests BOTTLES DRAWN AEROBIC AND ANAEROBIC 4ML  Final   Culture  Setup Time   Final    02/24/2014 15:42 Performed at [facility]    Culture   Final           BLOOD CULTURE RECEIVED NO GROWTH TO DATE CULTURE WILL BE HELD FOR 5 DAYS BEFORE ISSUING A FINAL NEGATIVE REPORT Performed at [facility]    Report Status PENDING  Incomplete  Urine culture     Status: None (Preliminary result)   Collection Time: 02/24/14 12:21 PM  Result Value Ref Range Status   Specimen Description URINE, CLEAN CATCH  Final   Special Requests Normal  Final   Culture  Setup Time   Final    02/24/2014 19:13 Performed at [place]   Final    >=100,000  COLONIES/ML Performed at [facility]    Culture   Final    [place] Performed at [facility]    Report Status PENDING  Incomplete  Blood Culture (routine x 2)     Status: None (Preliminary result)   Collection Time: 02/24/14  4:15 PM  Result Value Ref Range Status   Specimen Description BLOOD RIGHT ARM  Final   Special Requests BOTTLES DRAWN AEROBIC ONLY 10CC  Final   Culture  Setup Time   Final    02/24/2014 21:22 Performed at [facility]    Culture   Final           BLOOD CULTURE RECEIVED NO GROWTH TO DATE CULTURE WILL BE HELD FOR 5 DAYS BEFORE ISSUING A FINAL NEGATIVE REPORT Performed at [facility]    Report Status PENDING  Incomplete    Medical History: Past Medical History  Diagnosis Date  . Obesity   . DVT (deep venous thrombosis) 2009    RLE, tx for ~6 months  . PONV (postoperative nausea and vomiting)   . Gallstones   . Ascites   . Uterine cancer 2010    Dr. Humphrey Rolls, chemotherapy  . Family history of anesthesia complication     MOTHER IS DIFFICULT TO WAKE  . Gallstones   . Cellulitis of right lower extremity 03/13/2013  . Iron deficiency anemia 03/12/2013  . Compression fracture of L2 04/14/2013  Assessment: 33 yoF with h/o metastatic uterine cancer, obesity, DVT. Presented to ER with fever, listlessness, polyuria w/o abdominal pain. Pharmacy consulted to dose Cefepime for probable UTI/ sepsis.  11/4 >>Cefepime >>  Tmax: 100.4 WBCs: down to WNL Renal: SCr 0.73 CrCl > 170ml/min N  11/4 blood: ngtd 11/4 urine: GNR pending  Goal of Therapy:  Appropriate antibiotic dosing for renal function; eradication of infection  Plan:  Continue Cefepime 1g IV Q8H  Follow up culture results  Kizzie Furnish, PharmD Pager: [phone] 02/26/2014 8:09 AM

## 2014-02-26 NOTE — Progress Notes (Signed)
Patient has a large area of yeast and moisture associated skin breakdown under her abdominal fold (Right side).  It is excoriated/bleeding.  This area is extremely moist and irritated.  Patient was bathed and dried well this am.  Interdry was placed between abdominal folds and fungal powder under breasts.  Patient may need WOC consult for this or additional medication for the yeast?  Will continue to keep these areas clean and dry.Azzie Glatter [place]

## 2014-02-26 NOTE — Plan of Care (Signed)
Problem: Phase II Progression Outcomes Goal: Progress activity as tolerated unless otherwise ordered Outcome: Progressing     

## 2014-02-26 NOTE — Plan of Care (Signed)
Problem: Phase II Progression Outcomes Goal: Tolerating diet Outcome: Completed/Met Date Met:  02/26/14     

## 2014-02-26 NOTE — Progress Notes (Signed)
02/26/2014, 9:13 AM  Hospital day 3 Antibiotics: cefepime Chemotherapy: carboplatin 02-10-14   Physicians PTA: W.Brewster, L.Janett Billow, M.Tsuei  Patient seen, aunt at bedside.  Subjective: Feeling better overall. Able to sleep last pm, up x4 to void which is close to baseline, and less urinary frequency otherwise. No nausea, has eaten. Up to [facility]. Not able to get in and out of recliner, but willing to try sitting up on BSC or window seat. Bowels ok. PICC ok.   ONCOLOGIC HISTORY Patient developed excessive uterine bleeding. Dilatation and curettage with hysteroscopy documented grade 1 endometrioid endometrial adenocarcinoma. Due to morbid obesity Mirena IUD was placed along with Aygestin. Patient had a dramatic directed weight loss with nutritional support she went from 523 pounds to 359 pounds in 10/2009. On 12/27/2009 she underwent a robotic-assisted laparoscopic hysterectomy, bilateral salpingo-oophorectomy, and mini laparotomy through the umbilical port with morcellation of uterus within about the delivery of the uterus. The final pathology revealed an endometrial adenocarcinoma grade 2 with invasion limited to 1 mm of the myometrium.  In 09/2012 when she developed right upper quadrant pain, presumed cholelithiasis, with laparoscopic evaluation finding peritoneal carcinomatosis and ascites. Omental biopsies had metastatic adenocarcinoma consistent with recurrent endometrial carcinoma. She had 12 cycles taxol carboplatin from 11-04-12 thru 07-24-13 with neulasta day 2. She initially required multiple paracenteses for ascites, none in months now. CT AP after cycle 12 showed stable to minimally improved disease. With progressive neuropathy, she continued single agent carboplatin, cycle 9 on 02-11-14. Repeat scans are planned due to probable slight carboplatin reaction just after than infusion completed -- see my note 02-10-14.  Objective: Vital signs in last 24 hours: Blood pressure  112/61, pulse 104, temperature 100.4 F (38 C), temperature source Oral, resp. rate 24, height 5\' 11"  (1.803 m), weight 423 lb (191.872 kg), SpO2 96 %. IV now at [facility]  Intake/Output from previous day: 11/05 0701 - 11/06 0700 In: 1820.8 [P.O.:780; I.V.:890.8; IV Piggyback:150] Out: 2250 [Urine:2250] Intake/Output this shift: Total I/O In: 240 [P.O.:240] Out: -   Physical exam:awake, alert, looks brighter. Respirations not labored supine RA. Oral mucosa moist. Lungs clear anteriorly. PICC insertion site ok. Heart RRR. Abdomen obese, soft, not distended, few BS, not tender. LE no pitting edema, cords, tenderness.  Lab Results:  Recent Labs  02/25/14 0410 02/26/14 0520  WBC 10.8* 5.5  HGB 11.2* 10.1*  HCT 32.3* 29.6*  PLT 94* 89*   BMET  Recent Labs  02/25/14 0410 02/26/14 0520  NA 134* 134*  K 3.3* 2.9*  CL 96 94*  CO2 25 27  GLUCOSE 126* 122*  BUN 15 11  CREATININE 0.76 0.73  CALCIUM 8.3* 8.2*   BLood cx from admission negative to date Urine culture with >100,000 colonies, still in process  Potassium orders already in.  Studies/Results: Dg Chest Port 1 View  02/24/2014   CLINICAL DATA:  48 year old female with high fever ranging from 104 to 107 since yesterday. Recent PICC placement.  EXAM: PORTABLE CHEST - 1 VIEW  COMPARISON:  Chest CT 08/03/2013.  Chest x-ray 03/12/2013.  FINDINGS: There is a left upper extremity PICC with tip terminating in the right atrium, approximately 3.6 cm distal to the superior cavoatrial junction. Lung volumes are normal. No acute consolidative airspace disease. No pleural effusions. No pneumothorax. No evidence of pulmonary edema. Heart size is mildly enlarged. The patient is rotated to the right on today's exam, resulting in distortion of the mediastinal contours and reduced diagnostic sensitivity and specificity for mediastinal pathology.  IMPRESSION: 1. No radiographic evidence of acute cardiopulmonary disease. 2. Left upper extremity PICC  terminates in the right atrium approximately 3.6 cm distal to the superior cavoatrial junction.   Electronically Signed   By: Vinnie Langton M.D.   On: 02/24/2014 10:57     Assessment/Plan: 1,Fever, chills in patient immunocompromised by ongoing chemotherapy, tho not neutropenic. Likely urinary tract source. Clinically better on empiric maxipime, awaiting cultures.  2.metastatic endometrial carcinoma with primarily pelvic involvement by last CT AP 07-2013. She has been on chemotherapy almost continuously since 10-2012, in attempt to control disease. Plan is to repeat scans upcoming as she likely had slight carboplatin reaction at last treatment 02-10-14. 3.Morbid obesity 4.PICC in 5.DVT with previous PAC, on arixtra for this + morbid obesity/ immobility/ active gyn cancer 6.C diff diarrhea requiring 2 cycles of treatment thru June 2015, no symptoms now 7.GERD on protonix and carafate 8.probable cholelithiasis by last scans 9.degenerative arthritis T and L spine, with compression fractures T10-31 Jan 2013 post fall, improved with kyphoplasty 10.Mild thrombocytopenia: no bleeding, likely related to recent chemo. Would hold arixtra if drops further. 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

## 2014-02-26 NOTE — Plan of Care (Signed)
Problem: Phase II Progression Outcomes Goal: Voiding independently Outcome: Completed/Met Date Met:  02/26/14

## 2014-02-26 NOTE — Plan of Care (Signed)
Problem: Phase I Progression Outcomes Goal: Pain controlled with appropriate interventions Outcome: Completed/Met Date Met:  02/26/14 Goal: Vital Signs stable- temperature less than 102 Outcome: Progressing Goal: Initial discharge plan identified Outcome: Progressing Goal: Voiding-avoid urinary catheter unless indicated Outcome: Completed/Met Date Met:  02/26/14 Goal: Adequate I & O Outcome: Completed/Met Date Met:  02/26/14 Goal: Tolerating diet Outcome: Completed/Met Date Met:  02/26/14 Goal: Hemodynamically stable Outcome: Progressing Goal: Other Phase I Outcomes/Goals Outcome: Progressing

## 2014-02-26 NOTE — Plan of Care (Signed)
Problem: Phase III Progression Outcomes Goal: Pain controlled on oral analgesia Outcome: Completed/Met Date Met:  02/26/14     

## 2014-02-26 NOTE — Progress Notes (Signed)
Pt encouraged to be mobile today, reported that she walked from bedside commode (next to bed) to room door and back 4 times, but that was all she could tolerate for now. Pt using IS hourly 2500cc. Hortencia Conradi RN

## 2014-02-27 DIAGNOSIS — B359 Dermatophytosis, unspecified: Secondary | ICD-10-CM

## 2014-02-27 LAB — CBC
HCT: 30.6 % — ABNORMAL LOW (ref 36.0–46.0)
HEMOGLOBIN: 10.4 g/dL — AB (ref 12.0–15.0)
MCH: 35.1 pg — AB (ref 26.0–34.0)
MCHC: 34 g/dL (ref 30.0–36.0)
MCV: 103.4 fL — ABNORMAL HIGH (ref 78.0–100.0)
Platelets: 83 10*3/uL — ABNORMAL LOW (ref 150–400)
RBC: 2.96 MIL/uL — ABNORMAL LOW (ref 3.87–5.11)
RDW: 14.7 % (ref 11.5–15.5)
WBC: 5 10*3/uL (ref 4.0–10.5)

## 2014-02-27 LAB — COMPREHENSIVE METABOLIC PANEL
ALK PHOS: 69 U/L (ref 39–117)
ALT: 36 U/L — ABNORMAL HIGH (ref 0–35)
AST: 37 U/L (ref 0–37)
Albumin: 2.8 g/dL — ABNORMAL LOW (ref 3.5–5.2)
Anion gap: 13 (ref 5–15)
BUN: 9 mg/dL (ref 6–23)
CALCIUM: 8.5 mg/dL (ref 8.4–10.5)
CO2: 27 mEq/L (ref 19–32)
Chloride: 98 mEq/L (ref 96–112)
Creatinine, Ser: 0.64 mg/dL (ref 0.50–1.10)
GFR calc non Af Amer: >90 mL/min (ref >90)
GLUCOSE: 120 mg/dL — AB (ref 70–99)
Potassium: 3.1 mEq/L — ABNORMAL LOW (ref 3.7–5.3)
SODIUM: 138 meq/L (ref 137–147)
TOTAL PROTEIN: 6.4 g/dL (ref 6.0–8.3)
Total Bilirubin: 0.5 mg/dL (ref 0.3–1.2)

## 2014-02-27 LAB — URINE CULTURE: SPECIAL REQUESTS: NORMAL

## 2014-02-27 LAB — MAGNESIUM: Magnesium: 1.1 mg/dL — ABNORMAL LOW (ref 1.5–2.5)

## 2014-02-27 MED ORDER — MAGNESIUM SULFATE IN D5W 10-5 MG/ML-% IV SOLN
1.0000 g | Freq: Once | INTRAVENOUS | Status: AC
  Administered 2014-02-27: 1 g via INTRAVENOUS
  Filled 2014-02-27: qty 100

## 2014-02-27 MED ORDER — POTASSIUM CHLORIDE CRYS ER 20 MEQ PO TBCR
40.0000 meq | EXTENDED_RELEASE_TABLET | Freq: Once | ORAL | Status: AC
  Administered 2014-02-27: 40 meq via ORAL
  Filled 2014-02-27: qty 2

## 2014-02-27 NOTE — Progress Notes (Signed)
PT Cancellation Note  Patient Details Name: [name] MRN: [id number] DOB: 31-Aug-1965   Cancelled Treatment:    Reason Eval/Treat Not Completed: PT screened, no needs identified, will sign off; Discussed benefits of PT, offered to come back another day adn pt stated that she did not want any PT at all at this time; Re-consult if needed   [facility] 02/27/2014, 1:24 PM

## 2014-02-27 NOTE — Plan of Care (Signed)
Problem: Phase II Progression Outcomes Goal: Progress activity as tolerated unless otherwise ordered Outcome: Completed/Met Date Met:  02/27/14  Problem: Phase III Progression Outcomes Goal: Tolerating diet Outcome: Completed/Met Date Met:  02/27/14

## 2014-02-27 NOTE — Progress Notes (Signed)
[facility] PROGRESS NOTE  [name] GGY:[id number] DOB: 1965-06-07 DOA: 02/24/2014  PCP: Orpah Melter, MD  Brief HPI: [name] is an 48 y.o. female with a PMH of DVT on chronic anticoagulation, metastatic uterine cancer receiving chemotherapy (received Carboplatin, Emend and decadron on 02/10/14 followed by Neulasta support on 02/11/14) who awoke the morning of admission with shaking chills and fevers with a home temperature reading of 107.The patient reported having to urinate every 1-2 hours, with urgency and urinary incontinence.Upon initial evaluation in the ER, the patient was found to have an unremarkable CXR, no leukocytosis, low grade fever of 100.Urinalysis was positive for nitrites and leukocytes. She was started on Cefepime.  Past medical history:  Past Medical History  Diagnosis Date  . Obesity   . DVT (deep venous thrombosis) 2009    RLE, tx for ~6 months  . PONV (postoperative nausea and vomiting)   . Gallstones   . Ascites   . Uterine cancer 2010    Dr. Humphrey Rolls, chemotherapy  . Family history of anesthesia complication     MOTHER IS DIFFICULT TO WAKE  . Gallstones   . Cellulitis of right lower extremity 03/13/2013  . Iron deficiency anemia 03/12/2013  . Compression fracture of L2 04/14/2013    Consultants: Oncology  Procedures: None  Antibiotics: Cefepime 11/4-->  Subjective: Patient continues to feel better. Rash in right lower abdominal fold is less painful. No nausea or vomiting.   Objective: Vital Signs  Filed Vitals:   02/26/14 1401 02/26/14 1733 02/26/14 2151 02/27/14 0607  BP: 90/62 107/56 95/62 116/99  Pulse: 115 94 90 84  Temp: 100.8 F (38.2 C)  98.9 F (37.2 C) 98.7 F (37.1 C)  TempSrc: Oral  Oral Oral  Resp: 22  22 22   Height:      Weight:      SpO2: 97%  94% 97%    Intake/Output Summary (Last 24 hours) at 02/27/14 0819 Last data filed at 02/27/14 0700  Gross per 24 hour  Intake   2043 ml  Output   2375 ml    Net   -332 ml   Filed Weights   02/24/14 1221 02/24/14 1630  Weight: 191.872 kg (423 lb) 191.872 kg (423 lb)   Limited examination due to body habitus. General appearance: alert, cooperative, appears stated age, no distress and morbidly obese Resp: clear to auscultation bilaterally Cardio: regular rate and rhythm, S1, S2 normal, no murmur, click, rub or gallop GI: Morbidly obese, soft, non-tender; bowel sounds normal; no masses,  no organomegaly Skin: excoriation noted in right lower abdominal fold.  Lab Results:  Basic Metabolic Panel:  Recent Labs Lab 02/24/14 1105 02/25/14 0410 02/26/14 0520 02/27/14 0600  NA 133* 134* 134* 138  K 3.5* 3.3* 2.9* 3.1*  CL 92* 96 94* 98  CO2 24 25 27 27   GLUCOSE 140* 126* 122* 120*  BUN 15 15 11 9   CREATININE 0.77 0.76 0.73 0.64  CALCIUM 9.2 8.3* 8.2* 8.5   Liver Function Tests:  Recent Labs Lab 02/24/14 1105 02/27/14 0600  AST 37 37  ALT 34 36*  ALKPHOS 100 69  BILITOT 1.0 0.5  PROT 7.3 6.4  ALBUMIN 3.4* 2.8*    Recent Labs Lab 02/24/14 1105  LIPASE 11   CBC:  Recent Labs Lab 02/24/14 1105 02/25/14 0410 02/26/14 0520 02/27/14 0600  WBC 7.8 10.8* 5.5 5.0  NEUTROABS 6.5  --   --   --   HGB 13.0 11.2* 10.1* 10.4*  HCT 38.8 32.3* 29.6* 30.6*  MCV 107.2* 105.2* 103.9* 103.4*  PLT 105* 94* 89* 83*   BNP (last 3 results)  Recent Labs  03/12/13 0845  PROBNP 2322.0*     Recent Results (from the past 240 hour(s))  Blood Culture (routine x 2)     Status: None (Preliminary result)   Collection Time: 02/24/14 11:05 AM  Result Value Ref Range Status   Specimen Description BLOOD LEFT WRIST  Final   Special Requests BOTTLES DRAWN AEROBIC AND ANAEROBIC 4ML  Final   Culture  Setup Time   Final    02/24/2014 15:42 Performed at [facility]    Culture   Final           BLOOD CULTURE RECEIVED NO GROWTH TO DATE CULTURE WILL BE HELD FOR 5 DAYS BEFORE ISSUING A FINAL NEGATIVE REPORT Performed at [facility]    Report Status PENDING  Incomplete  Urine culture     Status: None (Preliminary result)   Collection Time: 02/24/14 12:21 PM  Result Value Ref Range Status   Specimen Description URINE, CLEAN CATCH  Final   Special Requests Normal  Final   Culture  Setup Time   Final    02/24/2014 19:13 Performed at [place]   Final    >=100,000 COLONIES/ML Performed at [facility]    Culture   Final    [place] Performed at [facility]    Report Status PENDING  Incomplete  Blood Culture (routine x 2)     Status: None (Preliminary result)   Collection Time: 02/24/14  4:15 PM  Result Value Ref Range Status   Specimen Description BLOOD RIGHT ARM  Final   Special Requests BOTTLES DRAWN AEROBIC ONLY 10CC  Final   Culture  Setup Time   Final    02/24/2014 21:22 Performed at [facility]    Culture   Final           BLOOD CULTURE RECEIVED NO GROWTH TO DATE CULTURE WILL BE HELD FOR 5 DAYS BEFORE ISSUING A FINAL NEGATIVE REPORT Performed at [facility]    Report Status PENDING  Incomplete      Studies/Results: No results found.  Medications:  Scheduled: . B-complex with vitamin C  1 tablet Oral Daily  . calcium carbonate  1 tablet Oral Q breakfast  . ceFEPime (MAXIPIME) IV  1 g Intravenous 3 times per day  . cholecalciferol  2,000 Units Oral Daily  . vitamin B-12  2,500 mcg Oral Daily  . ferrous sulfate  325 mg Oral TID WC  . fluconazole  100 mg Oral Daily  . folic acid  0.5 mg Oral Daily  . fondaparinux  10 mg Subcutaneous Daily  . gabapentin  200 mg Oral TID  . loratadine  10 mg Oral Daily  . magnesium gluconate  500 mg Oral Daily  . miconazole nitrate  1 application Topical QID  . multivitamin with minerals  1 tablet Oral Daily  . pantoprazole  40 mg Oral Daily  . potassium chloride  40 mEq Oral BID  . potassium chloride  40 mEq Oral Once   Continuous:  [id number] **OR** acetaminophen,  alum & mag hydroxide-simeth, cyclobenzaprine, oxyCODONE-acetaminophen **AND** oxyCODONE, promethazine  Assessment/Plan:  Principal Problem:   Sepsis secondary to UTI Active Problems:   Obesity - BMI 79   Chronic cholecystitis with calculus   Endometrial cancer   UTI (urinary tract  infection)   DVT of upper extremity (deep vein thrombosis)   Chemotherapy-induced neuropathy   Hypokalemia    Sepsis secondary to UTI Stable and appears to be improving. Urine growing GNR but final ID and sens is pending. Continue Cefepime. Continue IVF at lower rate.  Endometrial cancer Diagnosed 06/2007, Status post robotic hysterectomy and bilateral salpingo-oophorectomies 12/2009. 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 Dr. Marko Plume following.   DVT of upper extremity (deep vein thrombosis) Continue Arixtra.  Chemotherapy-induced neuropathy Continue Neurontin.  Mild Thrombocytopenia Likely due to chemo. Monitor.  Macrocytic Anemia Slight drop in Hgb noted. Likely due to dilution. Monitor for now.  Obesity - BMI 79 Has been in diet counseling therapy in the past.  Tinea On miconazole powder. Diflucan.  Hypokalemia/Hypomagnesemia Replete  DVT Prophylaxis: On Arixtra    Code Status: Full Code  Family Communication: Discussed with patient  Disposition Plan: Not ready for discharge. Mobilize. PT/OT.    LOS: 3 days   [place] [facility] Pager [phone] 02/27/2014, 8:19 AM  If 8PM-8AM, please contact night-coverage at [email or web address], password [facility]

## 2014-02-27 NOTE — Progress Notes (Signed)
OT Cancellation Note  Patient Details Name: [name] MRN: [id number] DOB: 03/17/1966   Cancelled Treatment:    Reason Eval/Treat Not Completed: OT screened, no needs identified, will sign off  Flynt Breeze A 02/27/2014, 4:00 PM

## 2014-02-28 ENCOUNTER — Other Ambulatory Visit: Payer: Self-pay | Admitting: Oncology

## 2014-02-28 LAB — BASIC METABOLIC PANEL
Anion gap: 12 (ref 5–15)
BUN: 9 mg/dL (ref 6–23)
CHLORIDE: 98 meq/L (ref 96–112)
CO2: 27 mEq/L (ref 19–32)
Calcium: 8.5 mg/dL (ref 8.4–10.5)
Creatinine, Ser: 0.64 mg/dL (ref 0.50–1.10)
GFR calc Af Amer: >90 mL/min (ref >90)
GLUCOSE: 121 mg/dL — AB (ref 70–99)
POTASSIUM: 3.3 meq/L — AB (ref 3.7–5.3)
SODIUM: 137 meq/L (ref 137–147)

## 2014-02-28 MED ORDER — CIPROFLOXACIN HCL 500 MG PO TABS
500.0000 mg | ORAL_TABLET | Freq: Two times a day (BID) | ORAL | Status: DC
  Administered 2014-02-28 – 2014-03-01 (×3): 500 mg via ORAL
  Filled 2014-02-28 (×4): qty 1

## 2014-02-28 MED ORDER — ADULT MULTIVITAMIN W/MINERALS CH
1.0000 | ORAL_TABLET | Freq: Every day | ORAL | Status: DC
  Administered 2014-02-28: 1 via ORAL
  Filled 2014-02-28 (×3): qty 1

## 2014-02-28 NOTE — Progress Notes (Signed)
[facility] PROGRESS NOTE  [name] PPJ:[id number] DOB: 1966/02/14 DOA: 02/24/2014  PCP: Orpah Melter, MD  Brief HPI: [name] is an 48 y.o. female with a PMH of DVT on chronic anticoagulation, metastatic uterine cancer receiving chemotherapy (received Carboplatin, Emend and decadron on 02/10/14 followed by Neulasta support on 02/11/14) who awoke the morning of admission with shaking chills and fevers with a home temperature reading of 107.The patient reported having to urinate every 1-2 hours, with urgency and urinary incontinence.Upon initial evaluation in the ER, the patient was found to have an unremarkable CXR, no leukocytosis, low grade fever of 100.Urinalysis was positive for nitrites and leukocytes. She was started on Cefepime. Urine culture grew Klebsiella sensitive to multiple Abx. She was changed over to Cipro orally.  Past medical history:  Past Medical History  Diagnosis Date  . Obesity   . DVT (deep venous thrombosis) 2009    RLE, tx for ~6 months  . PONV (postoperative nausea and vomiting)   . Gallstones   . Ascites   . Uterine cancer 2010    Dr. Humphrey Rolls, chemotherapy  . Family history of anesthesia complication     MOTHER IS DIFFICULT TO WAKE  . Gallstones   . Cellulitis of right lower extremity 03/13/2013  . Iron deficiency anemia 03/12/2013  . Compression fracture of L2 04/14/2013    Consultants: Oncology  Procedures: None  Antibiotics: Cefepime 11/4-->11/8 Cipro 11/8-->  Subjective: Patient feels well. Rash in right lower abdominal fold is less painful. No nausea or vomiting.   Objective: Vital Signs  Filed Vitals:   02/27/14 0607 02/27/14 1310 02/27/14 2121 02/28/14 0505  BP: 116/99 101/66 101/56 100/54  Pulse: 84 75 96 88  Temp: 98.7 F (37.1 C) 99.4 F (37.4 C) 98.7 F (37.1 C) 97.8 F (36.6 C)  TempSrc: Oral  Oral Oral  Resp: 22 20 22 20   Height:      Weight:      SpO2: 97% 98% 98% 96%    Intake/Output Summary  (Last 24 hours) at 02/28/14 0813 Last data filed at 02/28/14 0506  Gross per 24 hour  Intake   2130 ml  Output   3075 ml  Net   -945 ml   Filed Weights   02/24/14 1221 02/24/14 1630  Weight: 191.872 kg (423 lb) 191.872 kg (423 lb)   Limited examination due to body habitus. General appearance: alert, cooperative, appears stated age, no distress and morbidly obese Resp: clear to auscultation bilaterally Cardio: regular rate and rhythm, S1, S2 normal, no murmur, click, rub or gallop GI: Morbidly obese, soft, non-tender; bowel sounds normal; no masses,  no organomegaly Skin: excoriation noted in right lower abdominal fold. Less erythematous.  Lab Results:  Basic Metabolic Panel:  Recent Labs Lab 02/24/14 1105 02/25/14 0410 02/26/14 0520 02/27/14 0600 02/28/14 0605  NA 133* 134* 134* 138 137  K 3.5* 3.3* 2.9* 3.1* 3.3*  CL 92* 96 94* 98 98  CO2 24 25 27 27 27   GLUCOSE 140* 126* 122* 120* 121*  BUN 15 15 11 9 9   CREATININE 0.77 0.76 0.73 0.64 0.64  CALCIUM 9.2 8.3* 8.2* 8.5 8.5  MG  --   --   --  1.1*  --    Liver Function Tests:  Recent Labs Lab 02/24/14 1105 02/27/14 0600  AST 37 37  ALT 34 36*  ALKPHOS 100 69  BILITOT 1.0 0.5  PROT 7.3 6.4  ALBUMIN 3.4* 2.8*    Recent Labs Lab  02/24/14 1105  LIPASE 11   CBC:  Recent Labs Lab 02/24/14 1105 02/25/14 0410 02/26/14 0520 02/27/14 0600  WBC 7.8 10.8* 5.5 5.0  NEUTROABS 6.5  --   --   --   HGB 13.0 11.2* 10.1* 10.4*  HCT 38.8 32.3* 29.6* 30.6*  MCV 107.2* 105.2* 103.9* 103.4*  PLT 105* 94* 89* 83*   BNP (last 3 results)  Recent Labs  03/12/13 0845  PROBNP 2322.0*     Recent Results (from the past 240 hour(s))  Blood Culture (routine x 2)     Status: None (Preliminary result)   Collection Time: 02/24/14 11:05 AM  Result Value Ref Range Status   Specimen Description BLOOD LEFT WRIST  Final   Special Requests BOTTLES DRAWN AEROBIC AND ANAEROBIC 4ML  Final   Culture  Setup Time   Final     02/24/2014 15:42 Performed at [facility]    Culture   Final           BLOOD CULTURE RECEIVED NO GROWTH TO DATE CULTURE WILL BE HELD FOR 5 DAYS BEFORE ISSUING A FINAL NEGATIVE REPORT Performed at [facility]    Report Status PENDING  Incomplete  Urine culture     Status: None   Collection Time: 02/24/14 12:21 PM  Result Value Ref Range Status   Specimen Description URINE, CLEAN CATCH  Final   Special Requests Normal  Final   Culture  Setup Time   Final    02/24/2014 19:13 Performed at [place]   Final    >=100,000 COLONIES/ML Performed at [facility]    Culture   Final    [place] Performed at [facility]    Report Status 02/27/2014 FINAL  Final   Organism ID, Bacteria KLEBSIELLA PNEUMONIAE  Final      Susceptibility   Klebsiella pneumoniae - MIC*    AMPICILLIN RESISTANT      CEFAZOLIN <=4 SENSITIVE Sensitive     CEFTRIAXONE <=1 SENSITIVE Sensitive     CIPROFLOXACIN <=0.25 SENSITIVE Sensitive     GENTAMICIN <=1 SENSITIVE Sensitive     LEVOFLOXACIN <=0.12 SENSITIVE Sensitive     NITROFURANTOIN 32 SENSITIVE Sensitive     TOBRAMYCIN <=1 SENSITIVE Sensitive     TRIMETH/SULFA <=20 SENSITIVE Sensitive     PIP/TAZO <=4 SENSITIVE Sensitive     * KLEBSIELLA PNEUMONIAE  Blood Culture (routine x 2)     Status: None (Preliminary result)   Collection Time: 02/24/14  4:15 PM  Result Value Ref Range Status   Specimen Description BLOOD RIGHT ARM  Final   Special Requests BOTTLES DRAWN AEROBIC ONLY 10CC  Final   Culture  Setup Time   Final    02/24/2014 21:22 Performed at [facility]    Culture   Final           BLOOD CULTURE RECEIVED NO GROWTH TO DATE CULTURE WILL BE HELD FOR 5 DAYS BEFORE ISSUING A FINAL NEGATIVE REPORT Performed at [facility]    Report Status PENDING  Incomplete      Studies/Results: No results found.  Medications:  Scheduled: . B-complex with vitamin C  1  tablet Oral Daily  . calcium carbonate  1 tablet Oral Q breakfast  . cholecalciferol  2,000 Units Oral Daily  . ciprofloxacin  500 mg Oral BID  . vitamin B-12  2,500 mcg Oral Daily  . ferrous sulfate  325 mg Oral TID WC  .  fluconazole  100 mg Oral Daily  . folic acid  0.5 mg Oral Daily  . fondaparinux  10 mg Subcutaneous Daily  . gabapentin  200 mg Oral TID  . loratadine  10 mg Oral Daily  . magnesium gluconate  500 mg Oral Daily  . miconazole nitrate  1 application Topical QID  . multivitamin with minerals  1 tablet Oral Daily  . pantoprazole  40 mg Oral Daily  . potassium chloride  40 mEq Oral BID   Continuous:  EXN:[id number] **OR** acetaminophen, alum & mag hydroxide-simeth, cyclobenzaprine, oxyCODONE-acetaminophen **AND** oxyCODONE, promethazine  Assessment/Plan:  Principal Problem:   Sepsis secondary to UTI Active Problems:   Obesity - BMI 79   Chronic cholecystitis with calculus   Endometrial cancer   UTI (urinary tract infection)   DVT of upper extremity (deep vein thrombosis)   Chemotherapy-induced neuropathy   Hypokalemia    Sepsis secondary to UTI Stable and appears to be improving. Urine growing Klebsiella. Will change to oral Cipro.   Endometrial cancer Diagnosed 06/2007, Status post robotic hysterectomy and bilateral salpingo-oophorectomies 12/2009. 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 Dr. Marko Plume following.   DVT of upper extremity (deep vein thrombosis) Continue Arixtra.  Chemotherapy-induced neuropathy Continue Neurontin.  Mild Thrombocytopenia Likely due to chemo. Monitor.  Macrocytic Anemia Slight drop in Hgb noted. Likely due to dilution. Monitor for now.  Obesity - BMI 79 Has been in diet counseling therapy in the past.  Tinea On miconazole powder. Diflucan. Improving.  Hypokalemia/Hypomagnesemia Replete  DVT Prophylaxis: On Arixtra    Code Status: Full Code  Family  Communication: Discussed with patient  Disposition Plan: Mobilize. Anticipate DC in AM.    LOS: 4 days   [place] [facility] Pager [phone] 02/28/2014, 8:13 AM  If 8PM-8AM, please contact night-coverage at [email or web address], password [facility]

## 2014-03-01 ENCOUNTER — Telehealth: Payer: Self-pay

## 2014-03-01 ENCOUNTER — Telehealth: Payer: Self-pay | Admitting: Oncology

## 2014-03-01 DIAGNOSIS — E876 Hypokalemia: Secondary | ICD-10-CM

## 2014-03-01 LAB — COMPREHENSIVE METABOLIC PANEL
ALBUMIN: 2.6 g/dL — AB (ref 3.5–5.2)
ALK PHOS: 68 U/L (ref 39–117)
ALT: 36 U/L — AB (ref 0–35)
AST: 29 U/L (ref 0–37)
Anion gap: 12 (ref 5–15)
BILIRUBIN TOTAL: 0.4 mg/dL (ref 0.3–1.2)
BUN: 9 mg/dL (ref 6–23)
CHLORIDE: 99 meq/L (ref 96–112)
CO2: 28 mEq/L (ref 19–32)
Calcium: 8.8 mg/dL (ref 8.4–10.5)
Creatinine, Ser: 0.69 mg/dL (ref 0.50–1.10)
GFR calc Af Amer: >90 mL/min (ref >90)
GFR calc non Af Amer: >90 mL/min (ref >90)
Glucose, Bld: 104 mg/dL — ABNORMAL HIGH (ref 70–99)
POTASSIUM: 3.6 meq/L — AB (ref 3.7–5.3)
SODIUM: 139 meq/L (ref 137–147)
TOTAL PROTEIN: 6.6 g/dL (ref 6.0–8.3)

## 2014-03-01 MED ORDER — FLUCONAZOLE 100 MG PO TABS: 100.0000 mg | ORAL_TABLET | Freq: Every day | ORAL | Status: DC

## 2014-03-01 MED ORDER — POTASSIUM CHLORIDE ER 10 MEQ PO TBCR: 20.0000 meq | EXTENDED_RELEASE_TABLET | Freq: Every day | ORAL | Status: DC

## 2014-03-01 MED ORDER — CIPROFLOXACIN HCL 500 MG PO TABS: 500.0000 mg | ORAL_TABLET | Freq: Two times a day (BID) | ORAL | Status: DC

## 2014-03-01 MED ORDER — MICONAZOLE NITRATE POWD: 1.0000 "application " | Freq: Four times a day (QID) | Status: AC

## 2014-03-01 NOTE — Telephone Encounter (Signed)
[name] will keep flush appointment only on 03-03-12 and see Dr. Marko Plume on 03-15-14 as noted below. Appointments cancelled for Dr. Marko Plume and lab. Sent prescription for KCL tabs from discharge instructions.  Kcl 10 meq tabs.  Take 2 tabs daily

## 2014-03-01 NOTE — Telephone Encounter (Signed)
, °

## 2014-03-01 NOTE — Telephone Encounter (Signed)
-----   Message from Gordy Levan, MD sent at 02/28/2014  8:44 PM EST ----- Likely to be DC from [facility] 11-9, klebsiella UTI.  Is to see me 11-11, but does not have CT until 11-18, and probably does not need to see me 11-11, tho may need PICC dressing change sometime this week.  I have done POF for MD visit to 3:30 on 11-23 x 30 min, which will be after CT. Please see if ok to cancel my apt 11-11, which I did not put on POF yet,  and set up correct PICC care -- Thank you!

## 2014-03-01 NOTE — Discharge Summary (Signed)
[facility]  Physician Discharge Summary   Patient ID: [name] MRN: [id number] DOB/AGE: 46-12-1965 46 y.o.  Admit date: 02/24/2014 Discharge date: 03/01/2014  PCP: Orpah Melter, MD  DISCHARGE DIAGNOSES:  Principal Problem:   Sepsis secondary to UTI Active Problems:   Obesity - BMI 79   Chronic cholecystitis with calculus   Endometrial cancer   UTI (urinary tract infection)   DVT of upper extremity (deep vein thrombosis)   Chemotherapy-induced neuropathy   Hypokalemia   RECOMMENDATIONS FOR OUTPATIENT FOLLOW UP: 1. Close follow up with Dr. Marko Plume. Patient has appointment on Wednesday.  [phone]. [phone]  DISCHARGE CONDITION: fair  Diet recommendation: Regular  Filed Weights   02/24/14 1221 02/24/14 1630  Weight: 191.872 kg (423 lb) 191.872 kg (423 lb)    INITIAL HISTORY: [name] is an 48 y.o. female with a PMH of DVT on chronic anticoagulation, metastatic uterine cancer receiving chemotherapy (received Carboplatin, Emend and decadron on 02/10/14 followed by Neulasta support on 02/11/14) who awoke the morning of admission with shaking chills and fevers with a home temperature reading of 107.The patient reported having to urinate every 1-2 hours, with urgency and urinary incontinence.Upon initial evaluation in the ER, the patient was found to have an unremarkable CXR, no leukocytosis, low grade fever of 100.Urinalysis was positive for nitrites and leukocytes. She was started on Cefepime. Urine culture grew Klebsiella sensitive to multiple Abx. She was changed over to Cipro orally.  Consultations:  Oncology  Procedures:  None  HOSPITAL COURSE:   Sepsis secondary to UTI Stable and improved. Urine growing Klebsiella. She was changed over to Cipro from Cefepime.   Endometrial cancer Diagnosed 06/2007, Status post robotic hysterectomy and bilateral salpingo-oophorectomies 12/2009. 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 Dr. Marko Plume following as OP.  DVT of upper extremity (deep vein thrombosis) Continue Arixtra.  Chemotherapy-induced neuropathy Continue Neurontin.  Mild Thrombocytopenia Likely due to chemo. Has been stable.  Macrocytic Anemia Slight drop in Hgb noted which is likely due to dilution. Hgb stable.  Obesity - BMI 79 Has been in diet counseling therapy in the past.  Tinea involving lower abdominal folds on right On miconazole powder. Diflucan till she completes antibiotic regimen. Follow up with Onc. Improving per patient.   Hypokalemia/Hypomagnesemia Repleted. Continue home regimen.   BP noted to be low and has always been on lower side. Patient states this is usual for her and denies any symptoms.   She is improved and stable for discharge.  PERTINENT LABS:  The results of significant diagnostics from this hospitalization (including imaging, microbiology, ancillary and laboratory) are listed below for reference.    Microbiology: Recent Results (from the past 240 hour(s))  Blood Culture (routine x 2)     Status: None (Preliminary result)   Collection Time: 02/24/14 11:05 AM  Result Value Ref Range Status   Specimen Description BLOOD LEFT WRIST  Final   Special Requests BOTTLES DRAWN AEROBIC AND ANAEROBIC 4ML  Final   Culture  Setup Time   Final    02/24/2014 15:42 Performed at [facility]    Culture   Final           BLOOD CULTURE RECEIVED NO GROWTH TO DATE CULTURE WILL BE HELD FOR 5 DAYS BEFORE ISSUING A FINAL NEGATIVE REPORT Performed at [facility]    Report Status PENDING  Incomplete  Urine culture     Status: None   Collection Time: 02/24/14 12:21 PM  Result Value Ref Range Status   Specimen Description URINE, CLEAN CATCH  Final   Special Requests Normal  Final   Culture  Setup Time   Final    02/24/2014 19:13 Performed at [facility]   Final    >=100,000 COLONIES/ML Performed at [facility]    Culture   Final    KLEBSIELLA PNEUMONIAE Performed at [facility]    Report Status 02/27/2014 FINAL  Final   Organism ID, Bacteria KLEBSIELLA PNEUMONIAE  Final      Susceptibility   Klebsiella pneumoniae - MIC*    AMPICILLIN RESISTANT      CEFAZOLIN <=4 SENSITIVE Sensitive     CEFTRIAXONE <=1 SENSITIVE Sensitive     CIPROFLOXACIN <=0.25 SENSITIVE Sensitive     GENTAMICIN <=1 SENSITIVE Sensitive     LEVOFLOXACIN <=0.12 SENSITIVE Sensitive     NITROFURANTOIN 32 SENSITIVE Sensitive     TOBRAMYCIN <=1 SENSITIVE Sensitive     TRIMETH/SULFA <=20 SENSITIVE Sensitive     PIP/TAZO <=4 SENSITIVE Sensitive     * KLEBSIELLA PNEUMONIAE  Blood Culture (routine x 2)     Status: None (Preliminary result)   Collection Time: 02/24/14  4:15 PM  Result Value Ref Range Status   Specimen Description BLOOD RIGHT ARM  Final   Special Requests BOTTLES DRAWN AEROBIC ONLY 10CC  Final   Culture  Setup Time   Final    02/24/2014 21:22 Performed at [facility]    Culture   Final           BLOOD CULTURE RECEIVED NO GROWTH TO DATE CULTURE WILL BE HELD FOR 5 DAYS BEFORE ISSUING A FINAL NEGATIVE REPORT Performed at [facility]    Report Status PENDING  Incomplete     Labs: Basic Metabolic Panel:  Recent Labs Lab 02/25/14 0410 02/26/14 0520 02/27/14 0600 02/28/14 0605 03/01/14 0500  NA 134* 134* 138 137 139  K 3.3* 2.9* 3.1* 3.3* 3.6*  CL 96 94* 98 98 99  CO2 25 27 27 27 28   GLUCOSE 126* 122* 120* 121* 104*  BUN 15 11 9 9 9   CREATININE 0.76 0.73 0.64 0.64 0.69  CALCIUM 8.3* 8.2* 8.5 8.5 8.8  MG  --   --  1.1*  --   --    Liver Function Tests:  Recent Labs Lab 02/24/14 1105 02/27/14 0600 03/01/14 0500  AST 37 37 29  ALT 34 36* 36*  ALKPHOS 100 69 68  BILITOT 1.0 0.5 0.4  PROT 7.3 6.4 6.6  ALBUMIN 3.4* 2.8* 2.6*    Recent Labs Lab 02/24/14 1105  LIPASE 11   CBC:  Recent Labs Lab 02/24/14 1105 02/25/14 0410 02/26/14 0520  02/27/14 0600  WBC 7.8 10.8* 5.5 5.0  NEUTROABS 6.5  --   --   --   HGB 13.0 11.2* 10.1* 10.4*  HCT 38.8 32.3* 29.6* 30.6*  MCV 107.2* 105.2* 103.9* 103.4*  PLT 105* 94* 89* 83*   BNP: BNP (last 3 results)  Recent Labs  03/12/13 0845  PROBNP 2322.0*   CBG: No results for input(s): GLUCAP in the last 168 hours.   IMAGING STUDIES Dg Chest Port 1 View  02/24/2014   CLINICAL DATA:  48 year old female with high fever ranging from 104 to 107 since yesterday. Recent PICC placement.  EXAM: PORTABLE CHEST - 1 VIEW  COMPARISON:  Chest CT 08/03/2013.  Chest x-ray 03/12/2013.  FINDINGS: There is a left upper extremity  PICC with tip terminating in the right atrium, approximately 3.6 cm distal to the superior cavoatrial junction. Lung volumes are normal. No acute consolidative airspace disease. No pleural effusions. No pneumothorax. No evidence of pulmonary edema. Heart size is mildly enlarged. The patient is rotated to the right on today's exam, resulting in distortion of the mediastinal contours and reduced diagnostic sensitivity and specificity for mediastinal pathology.  IMPRESSION: 1. No radiographic evidence of acute cardiopulmonary disease. 2. Left upper extremity PICC terminates in the right atrium approximately 3.6 cm distal to the superior cavoatrial junction.   Electronically Signed   By: Vinnie Langton M.D.   On: 02/24/2014 10:57    DISCHARGE EXAMINATION: Filed Vitals:   02/28/14 1447 02/28/14 2026 02/28/14 2033 03/01/14 0540  BP: 103/61 83/56 93/46  90/42  Pulse: 93 92 87 83  Temp:  98.7 F (37.1 C)  98.5 F (36.9 C)  TempSrc:  Oral  Oral  Resp:  24  20  Height:      Weight:      SpO2:  93%  93%   General appearance: alert, cooperative, appears stated age and no distress Resp: clear to auscultation bilaterally Cardio: regular rate and rhythm, S1, S2 normal, no murmur, click, rub or gallop GI: soft, non-tender; bowel sounds normal; no masses,  no organomegaly  DISPOSITION:  Home.  Discharge Instructions    Call MD for:  extreme fatigue    Complete by:  As directed      Call MD for:  persistant dizziness or light-headedness    Complete by:  As directed      Call MD for:  redness, tenderness, or signs of infection (pain, swelling, redness, odor or green/yellow discharge around incision site)    Complete by:  As directed      Call MD for:  severe uncontrolled pain    Complete by:  As directed      Call MD for:  temperature >100.4    Complete by:  As directed      Diet general    Complete by:  As directed      Discharge instructions    Complete by:  As directed   Please do not take the Lasix till seen by Dr. Marko Plume. Please keep your appointment with Dr. Marko Plume.     Increase activity slowly    Complete by:  As directed            ALLERGIES: No Known Allergies  Current Discharge Medication List    START taking these medications   Details  ciprofloxacin (CIPRO) 500 MG tablet Take 1 tablet (500 mg total) by mouth 2 (two) times daily. For 9 more days Qty: 18 tablet, Refills: 0    fluconazole (DIFLUCAN) 100 MG tablet Take 1 tablet (100 mg total) by mouth daily. For 9 more days. Qty: 9 tablet, Refills: 0      CONTINUE these medications which have CHANGED   Details  miconazole nitrate (MICATIN) POWD Apply 1 application topically 4 (four) times daily. Qty: 10 g, Refills: 2   Associated Diagnoses: Candida rash of groin      CONTINUE these medications which have NOT CHANGED   Details  Ascorbic Acid (VITAMIN C) 1000 MG tablet Take 1,000 mg by mouth daily.    b complex vitamins capsule Take 1 capsule by mouth daily.    calcium carbonate (OS-CAL) 600 MG TABS tablet Take 600 mg by mouth daily.    Cholecalciferol (VITAMIN D3) 2000 UNITS TABS Take 1 tablet  by mouth daily.    Cyanocobalamin (VITAMIN B-12 PO) Take 2,500 mcg by mouth daily.     cyclobenzaprine (FLEXERIL) 5 MG tablet Take 1 tablet (5 mg total) by mouth 3 (three) times daily as needed  for muscle spasms. Qty: 60 tablet, Refills: 0   Associated Diagnoses: Endometrial cancer    ferrous sulfate 325 (65 FE) MG tablet Take 325 mg by mouth 3 (three) times daily with meals.    folic acid (FOLVITE) 094 MCG tablet Take 400 mcg by mouth daily.      fondaparinux (ARIXTRA) 10 MG/0.8ML SOLN injection Inject 10 mg into the skin daily.    gabapentin (NEURONTIN) 100 MG capsule Take 2 capsules (200 mg total) by mouth 3 (three) times daily. Qty: 180 capsule, Refills: 3   Associated Diagnoses: Neuropathy    heparin lock flush 100 UNIT/ML SOLN injection Inject 250 Units into the vein See admin instructions. Flush each Lumen with 250 units 2.10ml daily    loratadine (CLARITIN) 10 MG tablet Take 10 mg by mouth daily.    magnesium gluconate (MAGONATE) 500 MG tablet Take 500 mg by mouth daily.    Multiple Vitamins-Minerals (MULTIVITAMIN WITH MINERALS) tablet Take 1 tablet by mouth daily.     ondansetron (ZOFRAN ODT) 8 MG disintegrating tablet Take 1 tablet (8 mg total) by mouth every 8 (eight) hours as needed for nausea or vomiting. Qty: 20 tablet, Refills: 0    oxyCODONE-acetaminophen (PERCOCET) 10-325 MG per tablet Take 1 tablet by mouth every 4 (four) hours as needed for pain. Qty: 90 tablet, Refills: 0   Associated Diagnoses: Endometrial cancer    pantoprazole (PROTONIX) 40 MG tablet Take 40 mg by mouth daily.    potassium chloride (K-DUR) 10 MEQ tablet Take 2 tablets (20 mEq total) by mouth daily. Qty: 14 tablet, Refills: 0   Associated Diagnoses: Hypokalemia    prochlorperazine (COMPAZINE) 10 MG tablet Take 10 mg by mouth every 6 (six) hours as needed for nausea or vomiting.    promethazine (PHENERGAN) 25 MG tablet Take 12.5 mg by mouth every 6 (six) hours as needed for nausea or vomiting.    sucralfate (CARAFATE) 1 GM/10ML suspension Take 1 g by mouth 4 (four) times daily.    ondansetron (ZOFRAN) 8 MG tablet TAKE 1 TABLET BY MOUTH TWICE A DAY AS NEEDED START DAY AFTER  CHEMO Qty: 20 tablet, Refills: 0   Associated Diagnoses: Malignant neoplasm of corpus uteri, except isthmus      STOP taking these medications     furosemide (LASIX) 20 MG tablet      nystatin ointment (MYCOSTATIN)        Follow-up Information    Follow up with Orpah Melter, MD.   Specialty:  Family Medicine   Why:  As needed   Contact information:   [address] [place] [address] [phone]       Follow up with Gordy Levan, MD.   Specialty:  Oncology   Why:  keep current appointment   Contact information:   [place] [address] [place]: 43 mins  [facility] Pager [phone]  03/01/2014, 8:16 AM

## 2014-03-01 NOTE — Care Management Note (Signed)
CARE MANAGEMENT NOTE 03/01/2014  Patient:  [name], [name]   Account Number:  [id number]  Date Initiated:  02/25/2014  Documentation initiated by:  Marney Doctor  Subjective/Objective Assessment:   48 yo admitted with sepsis. Hx of uterine CA, ascites, and DVT     Action/Plan:   From home with Mom   Anticipated DC Date:  03/01/2014   Anticipated DC Plan:  [place]  CM consult      Choice offered to / List presented to:             Status of service:  In process, will continue to follow Medicare Important Message given?   (If response is "NO", the following Medicare IM given date fields will be blank) Date Medicare IM given:   Medicare IM given by:   Date Additional Medicare IM given:   Additional Medicare IM given by:    Discharge Disposition:    Per UR Regulation:  Reviewed for med. necessity/level of care/duration of stay  If discussed at [place] of Stay Meetings, dates discussed:    Comments:  03/01/14 Marney Doctor RN,BSN,NCM Spoke with pt about DC needs.  Pt will be discharging home today with her picc line.  Pt was admitted with the picc line.  Per patient her mother flushes it every day and the pt goes to the cancer center once a week to have the dressing changed.  Pt states she has no DC needs and was appreciative of CM checking in on her.  02/25/14 Marney Doctor RN,BSN,NCM [phone] Chart reviewed and CM following for DC needs.

## 2014-03-01 NOTE — Progress Notes (Signed)
Patient was stable at time of discharge. I reviewed discharge education with patient and her mother. I gave her mother the patient's prescriptions. With [place] Pink MD's order to keep PICC in patient was d/c'd with it and they verbalized they would continue the maintenance plan they had been doing pre-admission for flushes and dressing changes. Patient and mother had no further questions.

## 2014-03-01 NOTE — Discharge Instructions (Signed)

## 2014-03-02 LAB — CULTURE, BLOOD (ROUTINE X 2)
Culture: NO GROWTH
Culture: NO GROWTH

## 2014-03-03 ENCOUNTER — Other Ambulatory Visit: Payer: BC Managed Care – PPO

## 2014-03-03 ENCOUNTER — Ambulatory Visit: Payer: BC Managed Care – PPO | Admitting: Oncology

## 2014-03-03 ENCOUNTER — Ambulatory Visit: Payer: BC Managed Care – PPO

## 2014-03-03 ENCOUNTER — Ambulatory Visit (HOSPITAL_BASED_OUTPATIENT_CLINIC_OR_DEPARTMENT_OTHER): Payer: BC Managed Care – PPO

## 2014-03-03 VITALS — BP 94/58 | HR 82 | Temp 97.8°F

## 2014-03-03 DIAGNOSIS — C541 Malignant neoplasm of endometrium: Secondary | ICD-10-CM

## 2014-03-03 DIAGNOSIS — Z452 Encounter for adjustment and management of vascular access device: Secondary | ICD-10-CM

## 2014-03-03 MED ORDER — HEPARIN SOD (PORK) LOCK FLUSH 100 UNIT/ML IV SOLN
500.0000 [IU] | Freq: Once | INTRAVENOUS | Status: AC
  Administered 2014-03-03: 250 [IU] via INTRAVENOUS
  Filled 2014-03-03: qty 5

## 2014-03-03 MED ORDER — SODIUM CHLORIDE 0.9 % IJ SOLN
10.0000 mL | INTRAMUSCULAR | Status: DC | PRN
  Administered 2014-03-03: 10 mL via INTRAVENOUS
  Filled 2014-03-03: qty 10

## 2014-03-03 NOTE — Patient Instructions (Signed)
PICC Home Guide A peripherally inserted central catheter (PICC) is a long, thin, flexible tube that is inserted into a vein in the upper arm. It is a form of intravenous (IV) access. It is considered to be a "central" line because the tip of the PICC ends in a large vein in your chest. This large vein is called the superior vena cava (SVC). The PICC tip ends in the SVC because there is a lot of blood flow in the SVC. This allows medicines and IV fluids to be quickly distributed throughout the body. The PICC is inserted using a sterile technique by a specially trained nurse or physician. After the PICC is inserted, a chest X-ray exam is done to be sure it is in the correct place.  A PICC may be placed for different reasons, such as:  To give medicines and liquid nutrition that can only be given through a central line. Examples are:  Certain antibiotic treatments.  Chemotherapy.  Total parenteral nutrition (TPN).  To take frequent blood samples.  To give IV fluids and blood products.  If there is difficulty placing a peripheral intravenous (PIV) catheter. If taken care of properly, a PICC can remain in place for several months. A PICC can also allow a person to go home from the hospital early. Medicine and PICC care can be managed at home by a family member or home health care team. WHAT PROBLEMS CAN HAPPEN WHEN I HAVE A PICC? Problems with a PICC can occasionally occur. These may include the following:  A blood clot (thrombus) forming in or at the tip of the PICC. This can cause the PICC to become clogged. A clot-dissolving medicine called tissue plasminogen activator (tPA) can be given through the PICC to help break up the clot.  Inflammation of the vein (phlebitis) in which the PICC is placed. Signs of inflammation may include redness, pain at the insertion site, red streaks, or being able to feel a "cord" in the vein where the PICC is located.  Infection in the PICC or at the insertion  site. Signs of infection may include fever, chills, redness, swelling, or pus drainage from the PICC insertion site.  PICC movement (malposition). The PICC tip may move from its original position due to excessive physical activity, forceful coughing, sneezing, or vomiting.  A break or cut in the PICC. It is important to not use scissors near the PICC.  Nerve or tendon irritation or injury during PICC insertion. WHAT SHOULD I KEEP IN MIND ABOUT ACTIVITIES WHEN I HAVE A PICC?  You may bend your arm and move it freely. If your PICC is near or at the bend of your elbow, avoid activity with repeated motion at the elbow.  Rest at home for the remainder of the day following PICC line insertion.  Avoid lifting heavy objects as instructed by your health care provider.  Avoid using a crutch with the arm on the same side as your PICC. You may need to use a walker. WHAT SHOULD I KNOW ABOUT MY PICC DRESSING?  Keep your PICC bandage (dressing) clean and dry to prevent infection.  Ask your health care provider when you may shower. Ask your health care provider to teach you how to wrap the PICC when you do take a shower.  Change the PICC dressing as instructed by your health care provider.  Change your PICC dressing if it becomes loose or wet. WHAT SHOULD I KNOW ABOUT PICC CARE?  Check the PICC insertion site   daily for leakage, redness, swelling, or pain.  Do not take a bath, swim, or use hot tubs when you have a PICC. Cover PICC line with clear plastic wrap and tape to keep it dry while showering.  Flush the PICC as directed by your health care provider. Let your health care provider know right away if the PICC is difficult to flush or does not flush. Do not use force to flush the PICC.  Do not use a syringe that is less than 10 mL to flush the PICC.  Never pull or tug on the PICC.  Avoid blood pressure checks on the arm with the PICC.  Keep your PICC identification card with you at all  times.  Do not take the PICC out yourself. Only a trained clinical professional should remove the PICC. SEEK IMMEDIATE MEDICAL CARE IF:  Your PICC is accidentally pulled all the way out. If this happens, cover the insertion site with a bandage or gauze dressing. Do not throw the PICC away. Your health care provider will need to inspect it.  Your PICC was tugged or pulled and has partially come out. Do not  push the PICC back in.  There is any type of drainage, redness, or swelling where the PICC enters the skin.  You cannot flush the PICC, it is difficult to flush, or the PICC leaks around the insertion site when it is flushed.  You hear a "flushing" sound when the PICC is flushed.  You have pain, discomfort, or numbness in your arm, shoulder, or jaw on the same side as the PICC.  You feel your heart "racing" or skipping beats.  You notice a hole or tear in the PICC.  You develop chills or a fever. MAKE SURE YOU:   Understand these instructions.  Will watch your condition.  Will get help right away if you are not doing well or get worse. Document Released: 10/14/2002 Document Revised: 08/24/2013 Document Reviewed: 12/15/2012 Exit[employer] Patient Information 2015 [employer]. This information is not intended to replace advice given to you by your health care provider. Make sure you discuss any questions you have with your health care provider.  

## 2014-03-05 ENCOUNTER — Telehealth: Payer: Self-pay | Admitting: Oncology

## 2014-03-05 NOTE — Telephone Encounter (Signed)
SCHEDULE CHANGE - MOVED 12/2 APPTS TO 12/3. S/W PT MOM SHE IS AWARE AND PT TO GET NEW SCHEDULE WHEN SHE COMES IN ON 11/18.

## 2014-03-09 ENCOUNTER — Other Ambulatory Visit: Payer: Self-pay | Admitting: *Deleted

## 2014-03-09 DIAGNOSIS — C541 Malignant neoplasm of endometrium: Secondary | ICD-10-CM

## 2014-03-09 MED ORDER — PROCHLORPERAZINE MALEATE 10 MG PO TABS: 10.0000 mg | ORAL_TABLET | Freq: Four times a day (QID) | ORAL | Status: DC | PRN

## 2014-03-10 ENCOUNTER — Ambulatory Visit (HOSPITAL_BASED_OUTPATIENT_CLINIC_OR_DEPARTMENT_OTHER): Payer: BC Managed Care – PPO

## 2014-03-10 ENCOUNTER — Ambulatory Visit (HOSPITAL_COMMUNITY)
Admission: RE | Admit: 2014-03-10 | Discharge: 2014-03-10 | Disposition: A | Discharge status: Home / Self Care | Payer: BC Managed Care – PPO | Source: Ambulatory Visit | Attending: Oncology | Admitting: Oncology

## 2014-03-10 ENCOUNTER — Encounter (HOSPITAL_COMMUNITY): Payer: Self-pay

## 2014-03-10 VITALS — BP 116/62 | HR 97 | Temp 97.7°F

## 2014-03-10 DIAGNOSIS — C541 Malignant neoplasm of endometrium: Secondary | ICD-10-CM | POA: Insufficient documentation

## 2014-03-10 DIAGNOSIS — R188 Other ascites: Secondary | ICD-10-CM | POA: Diagnosis not present

## 2014-03-10 DIAGNOSIS — K760 Fatty (change of) liver, not elsewhere classified: Secondary | ICD-10-CM | POA: Insufficient documentation

## 2014-03-10 DIAGNOSIS — Z452 Encounter for adjustment and management of vascular access device: Secondary | ICD-10-CM

## 2014-03-10 MED ORDER — HEPARIN SOD (PORK) LOCK FLUSH 100 UNIT/ML IV SOLN
500.0000 [IU] | Freq: Once | INTRAVENOUS | Status: AC
  Administered 2014-03-10: 500 [IU] via INTRAVENOUS
  Filled 2014-03-10: qty 5

## 2014-03-10 MED ORDER — SODIUM CHLORIDE 0.9 % IJ SOLN
10.0000 mL | INTRAMUSCULAR | Status: DC | PRN
  Administered 2014-03-10: 10 mL via INTRAVENOUS
  Filled 2014-03-10: qty 10

## 2014-03-10 MED ORDER — IOHEXOL 300 MG/ML  SOLN
125.0000 mL | Freq: Once | INTRAMUSCULAR | Status: AC | PRN
  Administered 2014-03-10: 125 mL via INTRAVENOUS

## 2014-03-10 NOTE — Patient Instructions (Signed)
PICC Home Guide A peripherally inserted central catheter (PICC) is a long, thin, flexible tube that is inserted into a vein in the upper arm. It is a form of intravenous (IV) access. It is considered to be a "central" line because the tip of the PICC ends in a large vein in your chest. This large vein is called the superior vena cava (SVC). The PICC tip ends in the SVC because there is a lot of blood flow in the SVC. This allows medicines and IV fluids to be quickly distributed throughout the body. The PICC is inserted using a sterile technique by a specially trained nurse or physician. After the PICC is inserted, a chest X-ray exam is done to be sure it is in the correct place.  A PICC may be placed for different reasons, such as:  To give medicines and liquid nutrition that can only be given through a central line. Examples are:  Certain antibiotic treatments.  Chemotherapy.  Total parenteral nutrition (TPN).  To take frequent blood samples.  To give IV fluids and blood products.  If there is difficulty placing a peripheral intravenous (PIV) catheter. If taken care of properly, a PICC can remain in place for several months. A PICC can also allow a person to go home from the hospital early. Medicine and PICC care can be managed at home by a family member or home health care team. WHAT PROBLEMS CAN HAPPEN WHEN I HAVE A PICC? Problems with a PICC can occasionally occur. These may include the following:  A blood clot (thrombus) forming in or at the tip of the PICC. This can cause the PICC to become clogged. A clot-dissolving medicine called tissue plasminogen activator (tPA) can be given through the PICC to help break up the clot.  Inflammation of the vein (phlebitis) in which the PICC is placed. Signs of inflammation may include redness, pain at the insertion site, red streaks, or being able to feel a "cord" in the vein where the PICC is located.  Infection in the PICC or at the insertion  site. Signs of infection may include fever, chills, redness, swelling, or pus drainage from the PICC insertion site.  PICC movement (malposition). The PICC tip may move from its original position due to excessive physical activity, forceful coughing, sneezing, or vomiting.  A break or cut in the PICC. It is important to not use scissors near the PICC.  Nerve or tendon irritation or injury during PICC insertion. WHAT SHOULD I KEEP IN MIND ABOUT ACTIVITIES WHEN I HAVE A PICC?  You may bend your arm and move it freely. If your PICC is near or at the bend of your elbow, avoid activity with repeated motion at the elbow.  Rest at home for the remainder of the day following PICC line insertion.  Avoid lifting heavy objects as instructed by your health care provider.  Avoid using a crutch with the arm on the same side as your PICC. You may need to use a walker. WHAT SHOULD I KNOW ABOUT MY PICC DRESSING?  Keep your PICC bandage (dressing) clean and dry to prevent infection.  Ask your health care provider when you may shower. Ask your health care provider to teach you how to wrap the PICC when you do take a shower.  Change the PICC dressing as instructed by your health care provider.  Change your PICC dressing if it becomes loose or wet. WHAT SHOULD I KNOW ABOUT PICC CARE?  Check the PICC insertion site   daily for leakage, redness, swelling, or pain.  Do not take a bath, swim, or use hot tubs when you have a PICC. Cover PICC line with clear plastic wrap and tape to keep it dry while showering.  Flush the PICC as directed by your health care provider. Let your health care provider know right away if the PICC is difficult to flush or does not flush. Do not use force to flush the PICC.  Do not use a syringe that is less than 10 mL to flush the PICC.  Never pull or tug on the PICC.  Avoid blood pressure checks on the arm with the PICC.  Keep your PICC identification card with you at all  times.  Do not take the PICC out yourself. Only a trained clinical professional should remove the PICC. SEEK IMMEDIATE MEDICAL CARE IF:  Your PICC is accidentally pulled all the way out. If this happens, cover the insertion site with a bandage or gauze dressing. Do not throw the PICC away. Your health care provider will need to inspect it.  Your PICC was tugged or pulled and has partially come out. Do not  push the PICC back in.  There is any type of drainage, redness, or swelling where the PICC enters the skin.  You cannot flush the PICC, it is difficult to flush, or the PICC leaks around the insertion site when it is flushed.  You hear a "flushing" sound when the PICC is flushed.  You have pain, discomfort, or numbness in your arm, shoulder, or jaw on the same side as the PICC.  You feel your heart "racing" or skipping beats.  You notice a hole or tear in the PICC.  You develop chills or a fever. MAKE SURE YOU:   Understand these instructions.  Will watch your condition.  Will get help right away if you are not doing well or get worse. Document Released: 10/14/2002 Document Revised: 08/24/2013 Document Reviewed: 12/15/2012 Exit[employer] Patient Information 2015 [employer]. This information is not intended to replace advice given to you by your health care provider. Make sure you discuss any questions you have with your health care provider.  

## 2014-03-14 ENCOUNTER — Other Ambulatory Visit: Payer: Self-pay | Admitting: Oncology

## 2014-03-14 DIAGNOSIS — C541 Malignant neoplasm of endometrium: Secondary | ICD-10-CM

## 2014-03-15 ENCOUNTER — Ambulatory Visit (HOSPITAL_BASED_OUTPATIENT_CLINIC_OR_DEPARTMENT_OTHER): Payer: BC Managed Care – PPO | Admitting: Oncology

## 2014-03-15 ENCOUNTER — Telehealth: Payer: Self-pay | Admitting: Oncology

## 2014-03-15 ENCOUNTER — Ambulatory Visit: Payer: BC Managed Care – PPO

## 2014-03-15 ENCOUNTER — Other Ambulatory Visit (HOSPITAL_BASED_OUTPATIENT_CLINIC_OR_DEPARTMENT_OTHER): Payer: BC Managed Care – PPO

## 2014-03-15 ENCOUNTER — Encounter: Payer: Self-pay | Admitting: Oncology

## 2014-03-15 ENCOUNTER — Other Ambulatory Visit: Payer: Self-pay | Admitting: Oncology

## 2014-03-15 VITALS — BP 108/50 | HR 93 | Temp 97.7°F | Resp 20 | Ht 71.0 in | Wt >= 6400 oz

## 2014-03-15 DIAGNOSIS — C541 Malignant neoplasm of endometrium: Secondary | ICD-10-CM

## 2014-03-15 DIAGNOSIS — C786 Secondary malignant neoplasm of retroperitoneum and peritoneum: Secondary | ICD-10-CM

## 2014-03-15 DIAGNOSIS — Z452 Encounter for adjustment and management of vascular access device: Secondary | ICD-10-CM

## 2014-03-15 DIAGNOSIS — R350 Frequency of micturition: Secondary | ICD-10-CM

## 2014-03-15 DIAGNOSIS — D649 Anemia, unspecified: Secondary | ICD-10-CM

## 2014-03-15 DIAGNOSIS — R188 Other ascites: Secondary | ICD-10-CM

## 2014-03-15 DIAGNOSIS — C801 Malignant (primary) neoplasm, unspecified: Secondary | ICD-10-CM

## 2014-03-15 DIAGNOSIS — D696 Thrombocytopenia, unspecified: Secondary | ICD-10-CM

## 2014-03-15 DIAGNOSIS — I82409 Acute embolism and thrombosis of unspecified deep veins of unspecified lower extremity: Secondary | ICD-10-CM

## 2014-03-15 DIAGNOSIS — K219 Gastro-esophageal reflux disease without esophagitis: Secondary | ICD-10-CM

## 2014-03-15 DIAGNOSIS — G620 Drug-induced polyneuropathy: Secondary | ICD-10-CM

## 2014-03-15 LAB — CBC WITH DIFFERENTIAL/PLATELET
BASO%: 0.2 % (ref 0.0–2.0)
BASOS ABS: 0 10*3/uL (ref 0.0–0.1)
EOS ABS: 0.1 10*3/uL (ref 0.0–0.5)
EOS%: 0.9 % (ref 0.0–7.0)
HCT: 35.8 % (ref 34.8–46.6)
HEMOGLOBIN: 11.7 g/dL (ref 11.6–15.9)
LYMPH#: 1.7 10*3/uL (ref 0.9–3.3)
LYMPH%: 25.9 % (ref 14.0–49.7)
MCH: 34.6 pg — ABNORMAL HIGH (ref 25.1–34.0)
MCHC: 32.7 g/dL (ref 31.5–36.0)
MCV: 105.9 fL — AB (ref 79.5–101.0)
MONO#: 0.6 10*3/uL (ref 0.1–0.9)
MONO%: 9.2 % (ref 0.0–14.0)
NEUT%: 63.8 % (ref 38.4–76.8)
NEUTROS ABS: 4.2 10*3/uL (ref 1.5–6.5)
Platelets: 141 10*3/uL — ABNORMAL LOW (ref 145–400)
RBC: 3.38 10*6/uL — ABNORMAL LOW (ref 3.70–5.45)
RDW: 15.1 % — ABNORMAL HIGH (ref 11.2–14.5)
WBC: 6.6 10*3/uL (ref 3.9–10.3)

## 2014-03-15 LAB — COMPREHENSIVE METABOLIC PANEL (CC13)
ALBUMIN: 3.3 g/dL — AB (ref 3.5–5.0)
ALT: 17 U/L (ref 0–55)
AST: 19 U/L (ref 5–34)
Alkaline Phosphatase: 82 U/L (ref 40–150)
Anion Gap: 12 mEq/L — ABNORMAL HIGH (ref 3–11)
BUN: 13.4 mg/dL (ref 7.0–26.0)
CALCIUM: 10 mg/dL (ref 8.4–10.4)
CO2: 27 meq/L (ref 22–29)
Chloride: 101 mEq/L (ref 98–109)
Creatinine: 0.7 mg/dL (ref 0.6–1.1)
GLUCOSE: 140 mg/dL (ref 70–140)
Potassium: 3.7 mEq/L (ref 3.5–5.1)
Sodium: 140 mEq/L (ref 136–145)
Total Bilirubin: 0.55 mg/dL (ref 0.20–1.20)
Total Protein: 7.2 g/dL (ref 6.4–8.3)

## 2014-03-15 LAB — URINALYSIS, MICROSCOPIC - CHCC
Bilirubin (Urine): NEGATIVE
Blood: NEGATIVE
GLUCOSE UR CHCC: NEGATIVE mg/dL
Ketones: NEGATIVE mg/dL
Leukocyte Esterase: NEGATIVE
NITRITE: NEGATIVE
Protein: <30 mg/dL
RBC / HPF: NEGATIVE (ref 0–2)
Specific Gravity, Urine: 1.02 (ref 1.003–1.035)
UROBILINOGEN UR: 0.2 mg/dL (ref 0.2–1)
pH: 6 (ref 4.6–8.0)

## 2014-03-15 LAB — MAGNESIUM (CC13): Magnesium: 1.5 mg/dl (ref 1.5–2.5)

## 2014-03-15 MED ORDER — HEPARIN SOD (PORK) LOCK FLUSH 100 UNIT/ML IV SOLN
500.0000 [IU] | Freq: Once | INTRAVENOUS | Status: AC
  Administered 2014-03-15: 500 [IU] via INTRAVENOUS
  Filled 2014-03-15: qty 5

## 2014-03-15 MED ORDER — SODIUM CHLORIDE 0.9 % IJ SOLN
10.0000 mL | INTRAMUSCULAR | Status: DC | PRN
  Administered 2014-03-15: 10 mL via INTRAVENOUS
  Filled 2014-03-15: qty 10

## 2014-03-15 MED ORDER — NITROFURANTOIN MONOHYD MACRO 100 MG PO CAPS: 100.0000 mg | ORAL_CAPSULE | Freq: Two times a day (BID) | ORAL | Status: DC

## 2014-03-15 NOTE — Progress Notes (Signed)
OFFICE PROGRESS NOTE   03/15/2014   Physicians:(K.Khan), W.Brewster, Orpah Melter, M.Tsuei  INTERVAL HISTORY:  Patient is seen, together with mother and aunt, in follow up of restaging CTs for advanced endometrial cancer and considerations for treatment. She was recently hospitalized (11-4 thru 03-01-14) with pansensitive Klebsiella UTI and clinical sepsis, Cipro completed on 03-10-14 but urinary frequency again for last 24 hours. CTs were repeated in part due to apparent carboplatin reaction just after last infusion completed on 02-10-14. Note she has had total 21 cycles of carboplatin from 10-2012 thru 02-10-14 (carbo + taxol x12 from 11-04-12 thru 07-24-13, then peripheral neuropathy such that taxol was held for additional 9 cycles of carboplatin from 08-20-13 thru 02-10-14). CT AP 03-10-14 compared with 08-03-13 shows basically stable small volume disease at liver capsule, omentum/peritoneum, adjacent to sigmoid colon and loculated fluid left abdomen. There is no hydronephrosis.  Patient has needed to void every hour beginning 03-14-14 thru last night and this AM, states large amounts. She has had no fever or chills, no frank dysuria or hematuria. She has had some nausea, used antiemetic last pm. She denies respiratory symptoms, problems with PICC (which was placed by IR 11 months ago, 04-14-13) or any diarrhea. She denies new or different pain or any other bleeding.  She saw Dr Skeet Latch last 03-2013.   PICC in since 03-2013 Flu vaccine done  ONCOLOGIC HISTORY Patient developed excessive uterine bleeding. Dilatation and curettage with hysteroscopy documented grade 1 endometrioid endometrial adenocarcinoma. Due to morbid obesity Mirena IUD was placed along with Aygestin. Patient had a dramatic directed weight loss with nutritional support she went from 523 pounds to 359 pounds in 10/2009. On 12/27/2009 she underwent a robotic-assisted laparoscopic hysterectomy, bilateral salpingo-oophorectomy, and  mini laparotomy through the umbilical port with morcellation of uterus within about the delivery of the uterus. The final pathology revealed an endometrial adenocarcinoma grade 2 with invasion limited to 1 mm of the myometrium.  In 09/2012 when she developed right upper quadrant pain, presumed cholelithiasis, with laparoscopic evaluation finding peritoneal carcinomatosis and ascites. Omental biopsies had metastatic adenocarcinoma consistent with recurrent endometrial carcinoma. She had 12 cycles taxol carboplatin from 11-04-12 thru 07-24-13 with neulasta day 2. CT AP after cycle 12 showed stable to minimally improved disease. With progressive neuropathy, she has continued single agent carboplatin, cycle 9 on 02-11-14. She initially required multiple paracenteses for ascites, none needed since Aug 2014.   Review of systems as above, also: Generally fatigued. No LE swelling. No sinus symptoms Remainder of 10 point Review of Systems negative.  Objective:  Vital signs in last 24 hours:  BP 108/50 mmHg  Pulse 93  Temp(Src) 97.7 F (36.5 C) (Oral)  Resp 20  Ht 5\' 11"  (1.803 m)  Wt 422 lb 14.4 oz (191.826 kg)  BMI 59.01 kg/m2 Weight is stable from most recent. Alert, oriented and appropriate. In [place]. Respirations not labored RA. Not in obvious acute discomfort. Hair is several inches long.  HEENT:PERRL, sclerae not icteric. Oral mucosa moist without lesions, posterior pharynx clear.  Neck supple. No JVD.  Lymphatics:no cervical,suraclavicular adenopathy Resp: clear to auscultation bilaterally and normal percussion bilaterally Cardio: regular rate and rhythm. No gallop. DZ:HGDJMEQ obese,  soft, nontender, not distended, cannot appreciate mass or organomegaly. Normally active bowel sounds.  Musculoskeletal/ Extremities: without pitting edema, cords, tenderness. Chronic venous stasis changes lower legs. Neuro:  peripheral neuropathy improved since off taxol, otherwise nonfocal on exam just in [facility].  PSYCH appropriate mood and affect. Skin without rash, ecchymosis, petechiae  PICC left upper arm dressings intact, site unremarkable, no swelling UE.  Lab Results:  Results for orders placed or performed in visit on 03/15/14  CBC with Differential  Result Value Ref Range   WBC 6.6 3.9 - 10.3 10e3/uL   NEUT# 4.2 1.5 - 6.5 10e3/uL   HGB 11.7 11.6 - 15.9 g/dL   HCT 35.8 34.8 - 46.6 %   Platelets 141 (L) 145 - 400 10e3/uL   MCV 105.9 (H) 79.5 - 101.0 fL   MCH 34.6 (H) 25.1 - 34.0 pg   MCHC 32.7 31.5 - 36.0 g/dL   RBC 3.38 (L) 3.70 - 5.45 10e6/uL   RDW 15.1 (H) 11.2 - 14.5 %   lymph# 1.7 0.9 - 3.3 10e3/uL   MONO# 0.6 0.1 - 0.9 10e3/uL   Eosinophils Absolute 0.1 0.0 - 0.5 10e3/uL   Basophils Absolute 0.0 0.0 - 0.1 10e3/uL   NEUT% 63.8 38.4 - 76.8 %   LYMPH% 25.9 14.0 - 49.7 %   MONO% 9.2 0.0 - 14.0 %   EOS% 0.9 0.0 - 7.0 %   BASO% 0.2 0.0 - 2.0 %  Magnesium  Result Value Ref Range   Magnesium 1.5 1.5 - 2.5 mg/dl  Comprehensive metabolic panel  Result Value Ref Range   Sodium 140 136 - 145 mEq/L   Potassium 3.7 3.5 - 5.1 mEq/L   Chloride 101 98 - 109 mEq/L   CO2 27 22 - 29 mEq/L   Glucose 140 70 - 140 mg/dl   BUN 13.4 7.0 - 26.0 mg/dL   Creatinine 0.7 0.6 - 1.1 mg/dL   Total Bilirubin 0.55 0.20 - 1.20 mg/dL   Alkaline Phosphatase 82 40 - 150 U/L   AST 19 5 - 34 U/L   ALT 17 0 - 55 U/L   Total Protein 7.2 6.4 - 8.3 g/dL   Albumin 3.3 (L) 3.5 - 5.0 g/dL   Calcium 10.0 8.4 - 10.4 mg/dL   Anion Gap 12 (H) 3 - 11 mEq/L    UA available after visit 3-6 WBC, moderate bacteria, many epithelial, LE negative, sp gr 1.020 Urine culture sent and pending  Studies/Results: EXAM: CT ABDOMEN AND PELVIS WITH CONTRAST 03-10-14  COMPARISON: 08/03/2013.  FINDINGS: Lower chest: Lung bases show no acute findings. Heart size normal. Catheter terminates in the lower right atrium. No pericardial or pleural effusion.  Hepatobiliary: Hyper attenuating lesion along the liver  dome measures 1.5 cm, likely stable. Liver appears decreased in attenuation diffusely. Gallbladder is not well visualized. No biliary ductal dilatation.  Pancreas: Negative.  Spleen: Negative.  Adrenals/Urinary Tract: Adrenal glands and kidneys are unremarkable. Ureters are decompressed. Bladder is low in volume, limiting evaluation.  Stomach/Bowel: Stomach, small bowel, appendix and colon are unremarkable.  Vascular/Lymphatic: Vascular structures are grossly unremarkable. No pathologically enlarged lymph nodes.  Reproductive: Hysterectomy. The ovaries are not well visualized.  Other: Omental nodularity persists. Largest nodule in the right anterior abdomen measures 12 mm in long axis (previously 2 cm), image 64. A nodule adjacent to the sigmoid colon measures 2.1 x 2.5 cm and is slightly different in configuration, making direct comparison difficult. Previously, lesion measured 1.9 x 3.3 cm. Loculated collection of ascites in the left abdomen measures 11.2 x 21.3 cm (previously 7.7 x 18.4 cm).  Musculoskeletal: No worrisome lytic or sclerotic lesions. Vertebroplasties are seen in the spine.  IMPRESSION: 1. Scattered omental/peritoneal nodularity is grossly stable, with exception of a nodule in the right anterior abdomen, which appear slightly smaller. 2. Loculated ascites  in the the left abdomen is larger. 3. Hepatic steatosis.  CT information discussed and I have shown patient and family PACs images.   Medications: I have reviewed the patient's current medications. With recent significant problems from UTI and present symptoms, have sent in script for macrobid x 3 days, to begin today as we await culture results. She is on no diuretics.  DISCUSSION: Urinary frequency concerning for UTI, as above. Note there was delay at office locating bariatric BSC (found in storage space in basement at [facility] after unable to locate at [facility]) such that UA information was not  completed until after visit.   I have suggested that we continue short treatment break thru upcoming holidays, as she has been on continuous chemo x 16 months and has small volume disease. As other options are available, I would prefer not to resume carboplatin in her due to reaction with last infusion. I have told them that we have a number or options, including chemo IV or oral and hormone blockers, but I have not gone into any detailed teaching now. Note doxil skin reactions may be more difficult due to morbid obesity, and Megace may cause further weight gain; may be worth considering CDDP gemzar, or day 04-30-13 taxotere (as taxol neuropathy has improved), or oral etoposide; possibly addition of avastin. As she has not seen Dr Skeet Latch in a year, I have set up visit with gyn onc on 04-12-14. Also as present PICC has been in for 11 months, managed extremely well by mother and [place], I have also suggested that this could be removed (if not needed for UTI now) during this treatment break. Patient and family are in full agreement with having PICC out at least for next several weeks. Note she did have peripheral IVs in both UE during recent hospitalization, so peripheral veins may also suffice depending on next regimen chosen.  Plan for now is to DC PICC on 11-25 if she does not need IV antibiotics for some resistant UTI (which would be unlikely) and is otherwise stable. If PICC not removed on 11-25, she will need flush/ dressing changes set up. Following visit, on review of EMR information, I see CA 125 of 170.9 in 09-2012 and of 11 in 04-2013. I will try to get CA125 repeated when she comes for possible PICC DC on 03-17-14.   Assessment/Plan: 1.Endometrial carcinoma IA grade 2 at diagnosis March 2009, treated nonsurgically until weight loss of ~ 165 lbs allowed hysterectomy BSO 12-2009. She was found to have recurrent disease with peritoneal carcinomatosis and ascites in 09-2012, with chemotherapy used  continuously from 10-2012 thru 02-10-14, total 21 cycles carboplatin (with taxol x first 12 cycles) until reaction with last treatment. CT now with essentially stable small volume disease. I will check CA125, as this was elevated with recurrence. Treatment break for next few weeks/ hopefully thru Christmas, then likely resume treatment with different regimen. We will be interested in Dr Leone Brand thoughts at upcoming visit. 2.urinary frequency: recent pansensitive Klebsiella UTI, treated with IV --> po antibiotics x 2 weeks. Urine culture pending. Empiric macrobid x 3 days started now. 3.PICC in since placed by IR 03-2013. If not needed for resistant UTI, I would like to have this removed for next few weeks of treatment break. Note peripheral veins adequate for IVs in hospital recently. 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 No marked ascites on this CT 6.DVT with previous PAC, arixtra continued with PICC in/  immobility/ gyn cancer. 7.GERD on protonix and carafate 8. Cholelithiasis by CT 9.compression fractures T 10-31 Jan 2013 after fall, post kyphoplasties. Degenerative arthritis 10. C diff diarrhea requiring 2 cycles antibiotics to clear, June 2015. No diarrhea now, but do not want to use unnecessary antibiotics in her 11.flu vaccine done [email or web address] thrombocytopenia and anemia in hospital improved 13.no advance directives per EMR  All questions answered. Time spent 40 min including >50% counseling and coordination of care.  Gordy Levan, MD   03/15/2014, 4:26 PM

## 2014-03-15 NOTE — Telephone Encounter (Signed)
per pof to sch pt appt-gave pt copy of sch-pt back to lab-

## 2014-03-15 NOTE — Patient Instructions (Signed)
PICC Home Guide A peripherally inserted central catheter (PICC) is a long, thin, flexible tube that is inserted into a vein in the upper arm. It is a form of intravenous (IV) access. It is considered to be a "central" line because the tip of the PICC ends in a large vein in your chest. This large vein is called the superior vena cava (SVC). The PICC tip ends in the SVC because there is a lot of blood flow in the SVC. This allows medicines and IV fluids to be quickly distributed throughout the body. The PICC is inserted using a sterile technique by a specially trained nurse or physician. After the PICC is inserted, a chest X-ray exam is done to be sure it is in the correct place.  A PICC may be placed for different reasons, such as:  To give medicines and liquid nutrition that can only be given through a central line. Examples are:  Certain antibiotic treatments.  Chemotherapy.  Total parenteral nutrition (TPN).  To take frequent blood samples.  To give IV fluids and blood products.  If there is difficulty placing a peripheral intravenous (PIV) catheter. If taken care of properly, a PICC can remain in place for several months. A PICC can also allow a person to go home from the hospital early. Medicine and PICC care can be managed at home by a family member or home health care team. WHAT PROBLEMS CAN HAPPEN WHEN I HAVE A PICC? Problems with a PICC can occasionally occur. These may include the following:  A blood clot (thrombus) forming in or at the tip of the PICC. This can cause the PICC to become clogged. A clot-dissolving medicine called tissue plasminogen activator (tPA) can be given through the PICC to help break up the clot.  Inflammation of the vein (phlebitis) in which the PICC is placed. Signs of inflammation may include redness, pain at the insertion site, red streaks, or being able to feel a "cord" in the vein where the PICC is located.  Infection in the PICC or at the insertion  site. Signs of infection may include fever, chills, redness, swelling, or pus drainage from the PICC insertion site.  PICC movement (malposition). The PICC tip may move from its original position due to excessive physical activity, forceful coughing, sneezing, or vomiting.  A break or cut in the PICC. It is important to not use scissors near the PICC.  Nerve or tendon irritation or injury during PICC insertion. WHAT SHOULD I KEEP IN MIND ABOUT ACTIVITIES WHEN I HAVE A PICC?  You may bend your arm and move it freely. If your PICC is near or at the bend of your elbow, avoid activity with repeated motion at the elbow.  Rest at home for the remainder of the day following PICC line insertion.  Avoid lifting heavy objects as instructed by your health care provider.  Avoid using a crutch with the arm on the same side as your PICC. You may need to use a walker. WHAT SHOULD I KNOW ABOUT MY PICC DRESSING?  Keep your PICC bandage (dressing) clean and dry to prevent infection.  Ask your health care provider when you may shower. Ask your health care provider to teach you how to wrap the PICC when you do take a shower.  Change the PICC dressing as instructed by your health care provider.  Change your PICC dressing if it becomes loose or wet. WHAT SHOULD I KNOW ABOUT PICC CARE?  Check the PICC insertion site   daily for leakage, redness, swelling, or pain.  Do not take a bath, swim, or use hot tubs when you have a PICC. Cover PICC line with clear plastic wrap and tape to keep it dry while showering.  Flush the PICC as directed by your health care provider. Let your health care provider know right away if the PICC is difficult to flush or does not flush. Do not use force to flush the PICC.  Do not use a syringe that is less than 10 mL to flush the PICC.  Never pull or tug on the PICC.  Avoid blood pressure checks on the arm with the PICC.  Keep your PICC identification card with you at all  times.  Do not take the PICC out yourself. Only a trained clinical professional should remove the PICC. SEEK IMMEDIATE MEDICAL CARE IF:  Your PICC is accidentally pulled all the way out. If this happens, cover the insertion site with a bandage or gauze dressing. Do not throw the PICC away. Your health care provider will need to inspect it.  Your PICC was tugged or pulled and has partially come out. Do not  push the PICC back in.  There is any type of drainage, redness, or swelling where the PICC enters the skin.  You cannot flush the PICC, it is difficult to flush, or the PICC leaks around the insertion site when it is flushed.  You hear a "flushing" sound when the PICC is flushed.  You have pain, discomfort, or numbness in your arm, shoulder, or jaw on the same side as the PICC.  You feel your heart "racing" or skipping beats.  You notice a hole or tear in the PICC.  You develop chills or a fever. MAKE SURE YOU:   Understand these instructions.  Will watch your condition.  Will get help right away if you are not doing well or get worse. Document Released: 10/14/2002 Document Revised: 08/24/2013 Document Reviewed: 12/15/2012 Exit[employer] Patient Information 2015 [employer]. This information is not intended to replace advice given to you by your health care provider. Make sure you discuss any questions you have with your health care provider.  

## 2014-03-16 ENCOUNTER — Other Ambulatory Visit: Payer: Self-pay | Admitting: Oncology

## 2014-03-16 ENCOUNTER — Telehealth: Payer: Self-pay | Admitting: *Deleted

## 2014-03-16 ENCOUNTER — Telehealth: Payer: Self-pay | Admitting: Oncology

## 2014-03-16 LAB — URINE CULTURE

## 2014-03-16 NOTE — Telephone Encounter (Signed)
Per Dr. Marko Plume - okay to pull PICC tomorrow since she is afebrile and as long as culture comes back negative. Called and spoke with Izzy's mother and she is agreeable to hold arixtra tomorrow and come for appt for lab and to pull PICC line.

## 2014-03-16 NOTE — Telephone Encounter (Signed)
S/w pt's mom confirming labs added on 11/25 per 11/24 POF.... Cherylann Banas

## 2014-03-16 NOTE — Telephone Encounter (Signed)
-----   Message from Gordy Levan, MD sent at 03/16/2014 10:06 AM EST ----- RN please call to check on her 11-24. Let them know that we do not have results yet on urine culture from 11-23 quite yet.  Is urinary frequency any better/ does she have fever?  If she is feeling better and no resistant organism showing on urine culture, plan is to pull PICC on 11-25. Suggest she HOLD arixtra today and 11-25, then resume. If any questions on 11-25, PAGE [facility].   If PICC not pulled 11-25, she will need flush/ dressing change the next week and be sure I know please.  Let her know that I have asked for lab apt for CA 125 on 11-25, as this may be helpful when she sees Dr Skeet Latch. POF in and I have sent message to schedulers  thanks

## 2014-03-16 NOTE — Telephone Encounter (Signed)
Called pt as noted below by Dr. Marko Plume. Spoke with patient's mother. She states that [name] has not had a fever. The urinary frequency is a little better but still there. They did not pick up the [place] last night - her mom states she was going to pick it up shortly. Pt has already had arixtra injection today. This information passed along to Dr. Marko Plume - will call pt back with decision to keep or pull PICC line this week.

## 2014-03-17 ENCOUNTER — Other Ambulatory Visit: Payer: Self-pay | Admitting: Oncology

## 2014-03-17 ENCOUNTER — Other Ambulatory Visit: Payer: Self-pay | Admitting: Internal Medicine

## 2014-03-17 ENCOUNTER — Ambulatory Visit (HOSPITAL_COMMUNITY)
Admission: RE | Admit: 2014-03-17 | Discharge: 2014-03-17 | Disposition: A | Discharge status: Home / Self Care | Payer: BC Managed Care – PPO | Source: Ambulatory Visit | Attending: Internal Medicine | Admitting: Internal Medicine

## 2014-03-17 ENCOUNTER — Ambulatory Visit: Payer: BC Managed Care – PPO

## 2014-03-17 ENCOUNTER — Other Ambulatory Visit (HOSPITAL_BASED_OUTPATIENT_CLINIC_OR_DEPARTMENT_OTHER): Payer: BC Managed Care – PPO

## 2014-03-17 ENCOUNTER — Telehealth: Payer: Self-pay | Admitting: *Deleted

## 2014-03-17 VITALS — BP 116/74 | HR 87 | Temp 98.1°F | Resp 22

## 2014-03-17 DIAGNOSIS — Z452 Encounter for adjustment and management of vascular access device: Secondary | ICD-10-CM

## 2014-03-17 DIAGNOSIS — C541 Malignant neoplasm of endometrium: Secondary | ICD-10-CM

## 2014-03-17 DIAGNOSIS — C786 Secondary malignant neoplasm of retroperitoneum and peritoneum: Secondary | ICD-10-CM

## 2014-03-17 DIAGNOSIS — C801 Malignant (primary) neoplasm, unspecified: Principal | ICD-10-CM

## 2014-03-17 NOTE — Telephone Encounter (Signed)
Notified of message below. Verbalized understanding. Has appt today at 1:15 for lab and flush

## 2014-03-17 NOTE — Progress Notes (Signed)
1415: Attempted to remove pt's PICC line and met resistance.  Myrtle, RN attempted as well and met resistance despite lying patient flat and moving pt's arm.  Dr. Julien Nordmann on call and notified, order received to send patient to IR for PICC removal.  Pt in no distress, transferred to IR via wheelchair by Janifer Adie, RN and family members.

## 2014-03-17 NOTE — Telephone Encounter (Signed)
-----   Message from Gordy Levan, MD sent at 03/17/2014  9:16 AM EST ----- Urine culture from 11-23 negative.  OK to DC PICC today. STOP CIPRO  Thanks Lennis

## 2014-03-18 LAB — CA 125(PREVIOUS METHOD): CA 125: 6.6 U/mL (ref 0.0–30.2)

## 2014-03-18 LAB — CA 125: CA 125: 8 U/mL (ref <35)

## 2014-03-19 ENCOUNTER — Other Ambulatory Visit: Payer: Self-pay | Admitting: Oncology

## 2014-03-22 ENCOUNTER — Telehealth: Payer: Self-pay | Admitting: *Deleted

## 2014-03-22 ENCOUNTER — Ambulatory Visit: Payer: BC Managed Care – PPO | Admitting: Gynecologic Oncology

## 2014-03-22 NOTE — Telephone Encounter (Signed)
Mother notified of message below. Doing OK, will cancel 12/3 with Dr Marko Plume Bladder symptoms resolved. Did fine with PICC D/C-glad to have freedom again.  Will call us if has any problems

## 2014-03-22 NOTE — Telephone Encounter (Signed)
Per the POF I have canceled the treatment for 12/3

## 2014-03-22 NOTE — Telephone Encounter (Signed)
-----   Message from Gordy Levan, MD sent at 03/19/2014  8:19 PM EST ----- (she is not having more chemo at least until after she sees Dr Skeet Latch 12-2. I cancelled chemo that was still on schedule for 12-3).  I think my appointment on 12-3 is old. Please let patient/ mom know that I'm glad to see her that day if any problems, but if doing ok can just wait until Dr Skeet Latch 12-21 and myself 12-28.  Please ask if bladder symptoms resolved and be sure she did ok with PICC DC. Let them know that the ca125 from 11-25 was in good low range at 8.  thanks

## 2014-03-23 ENCOUNTER — Other Ambulatory Visit: Payer: Self-pay | Admitting: *Deleted

## 2014-03-23 DIAGNOSIS — C541 Malignant neoplasm of endometrium: Secondary | ICD-10-CM

## 2014-03-23 MED ORDER — CYCLOBENZAPRINE HCL 5 MG PO TABS: 5.0000 mg | ORAL_TABLET | Freq: Three times a day (TID) | ORAL | Status: DC | PRN

## 2014-03-24 ENCOUNTER — Ambulatory Visit: Payer: BC Managed Care – PPO | Admitting: Oncology

## 2014-03-24 ENCOUNTER — Ambulatory Visit: Payer: BC Managed Care – PPO

## 2014-03-24 ENCOUNTER — Other Ambulatory Visit: Payer: BC Managed Care – PPO

## 2014-03-25 ENCOUNTER — Other Ambulatory Visit: Payer: BC Managed Care – PPO

## 2014-03-25 ENCOUNTER — Ambulatory Visit: Payer: BC Managed Care – PPO

## 2014-03-25 ENCOUNTER — Ambulatory Visit: Payer: BC Managed Care – PPO | Admitting: Oncology

## 2014-03-26 ENCOUNTER — Other Ambulatory Visit: Payer: Self-pay | Admitting: Medical Oncology

## 2014-03-26 ENCOUNTER — Other Ambulatory Visit: Payer: Self-pay | Admitting: *Deleted

## 2014-03-26 DIAGNOSIS — C541 Malignant neoplasm of endometrium: Secondary | ICD-10-CM

## 2014-03-26 MED ORDER — PROCHLORPERAZINE MALEATE 10 MG PO TABS: 10.0000 mg | ORAL_TABLET | Freq: Four times a day (QID) | ORAL | Status: DC | PRN

## 2014-03-31 ENCOUNTER — Ambulatory Visit: Payer: BC Managed Care – PPO

## 2014-04-01 ENCOUNTER — Other Ambulatory Visit: Payer: Self-pay

## 2014-04-01 DIAGNOSIS — C786 Secondary malignant neoplasm of retroperitoneum and peritoneum: Secondary | ICD-10-CM

## 2014-04-01 MED ORDER — FERROUS SULFATE 325 (65 FE) MG PO TABS: 325.0000 mg | ORAL_TABLET | Freq: Three times a day (TID) | ORAL | Status: DC

## 2014-04-07 ENCOUNTER — Ambulatory Visit: Payer: BC Managed Care – PPO

## 2014-04-09 ENCOUNTER — Other Ambulatory Visit: Payer: Self-pay | Admitting: *Deleted

## 2014-04-09 DIAGNOSIS — C541 Malignant neoplasm of endometrium: Secondary | ICD-10-CM

## 2014-04-09 MED ORDER — PROCHLORPERAZINE MALEATE 10 MG PO TABS: 10.0000 mg | ORAL_TABLET | Freq: Four times a day (QID) | ORAL | Status: DC | PRN

## 2014-04-12 ENCOUNTER — Other Ambulatory Visit: Payer: Self-pay | Admitting: Oncology

## 2014-04-12 ENCOUNTER — Ambulatory Visit: Payer: BC Managed Care – PPO | Attending: Gynecologic Oncology | Admitting: Gynecologic Oncology

## 2014-04-12 ENCOUNTER — Encounter: Payer: Self-pay | Admitting: Gynecologic Oncology

## 2014-04-12 ENCOUNTER — Telehealth: Payer: Self-pay | Admitting: *Deleted

## 2014-04-12 VITALS — BP 120/82 | HR 103 | Temp 97.5°F | Resp 24 | Ht 71.0 in | Wt >= 6400 oz

## 2014-04-12 DIAGNOSIS — C541 Malignant neoplasm of endometrium: Secondary | ICD-10-CM | POA: Diagnosis not present

## 2014-04-12 NOTE — Telephone Encounter (Signed)
Called to give pt date/time of appt with Dr. Skeet Latch. Pt's father answered the phone and stated [name] and her mom were not home yet from the cancer center. He wrote down date and time of appt in May 2016 and agreeable to pass along to [facility] when she gets home.

## 2014-04-12 NOTE — Progress Notes (Signed)
Office Visit:  GYN ONCOLOGY   [name] 48 y.o. female  CC: Endometrial cancer.  Persistent disease.   Assessment/Plan: [name] is a 48 y.o. with Stage IA, grade 2 endometrial adenocarcinoma initially diagnosed in March of 2009. Robotic hysterectomy bilateral salpingo-oophorectomy with minilaparotomy for removal of the specimen was performed in September 2011. She presented with evidence of peritoneal carcinomatosis and pathology consistent with glandular adenocarcinoma in May of 2014 presumptive endometrial cancer recurrence.  [name] is  s/p 12 cycles Taxol and platinum therapy followed by an additional   11 cycles single agent carboplatin.   I wonder about the adequacy of her dosing given her morbid obesity.  The patient's volume of distribution is much larger than the normal patient because of her weight.  The taxol/carbo formulas have upper limits of administration. and there pharmacological limitations against higher doses.  The current single agent carboplatin  regimen resulted in stabilization of the ascites.  However there was a platin allergic reaction with the last administration of chemotherapy.   Options proposed are 1.   Doxil/ avastin 2. Single agent Gemzar 3. Etoposide 4. Single agent avastin 5. ER PR testing of the tumor has been ordered to permit consideration for tamoxifen, an aromatase inhibitor or megace.  Recommend PET for the next imaging.  Follow-up in 4 months  HPI:  [name] is a 49 y.o.  diagnosed with a DVT in March of 2009 with Coumadin initiated at that time.  Excessive uterine bleeding developed leading to a dilatation and curettage with hysteroscopy.  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  Due to her morbid obesity, a Mirena IUD was placed along with the addition of Aygestin.  After dramatic, directed weight loss and nutritional support, she went from 523 lbs on her initial visit to 359 lbs in July of 2011.  Her BMI  was reduced to 50.  On December 27, 2009, she underwent a robotic-assisted laparoscopic hysterectomy, bilateral salpingo-oophorectomy, and mini-laparotomy through the umbilical port with morcellation of the uterus within a bag for delivery of the uterus. 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 Omental biopsies were obtained. Pathology was consistent with metastatic adenocarcinoma.  In 09/2012 when she developed right upper quadrant pain, presumed cholelithiasis, with laparoscopic evaluation finding peritoneal carcinomatosis and ascites. Omental biopsies had metastatic adenocarcinoma consistent with recurrent endometrial carcinoma. She had 12 cycles taxol carboplatin from 11-04-12 thru 07-24-13 with neulasta day 2. CT AP after cycle 12 showed stable to minimally improved disease. With progressive neuropathy, she has continued single agent carboplatin, cycle 9 on 02-11-14.  She initially required multiple paracenteses for ascites, none needed since Aug 2014. Lab Results  Component Value Date   CA125 8 03/17/2014   IMPRESSION: 1. Scattered omental/peritoneal nodularity is grossly stable, with exception of a nodule in the right anterior abdomen, which appear slightly smaller. 2. Loculated ascites in the the left abdomen is larger. 3. Hepatic steatosis.  Carboplatin allergic reaction at the last cycle.  Doing well.  Fair QOL, no nausea or vomiting, back pain has resolved, no vaginal bleeding, significant neuropathy, limiited ambulation.  Past Surgical Hx:  Past Surgical History  Procedure Laterality Date  . Ankle surgery  1990  . Wisdom tooth extraction    .  Tonsillectomy and adenoidectomy    . Abdominal hysterectomy  12/2009     [facility], BSO  . Laparoscopy N/A 10/02/2012    Procedure: LAPAROSCOPY DIAGNOSTIC, perocentisis, omental biopsy;  Surgeon: Imogene Burn. Tsuei, MD;  Location: [facility];  Service: General;  Laterality: N/A;  removed a total of 15,000 of acities  . Paracentesis    . Kyphoplasty  02/26/2013   Past Medical Hx:  Past Medical History  Diagnosis Date  . Obesity   . DVT (deep venous thrombosis) 2009    RLE, tx for ~6 months  . PONV (postoperative nausea and vomiting)   . Gallstones   . Ascites   . Uterine cancer 2010    Dr. Humphrey Rolls, chemotherapy  . Family history of anesthesia complication     MOTHER IS DIFFICULT TO WAKE  . Gallstones   . Cellulitis of right lower extremity 03/13/2013  . Iron deficiency anemia 03/12/2013  . Compression fracture of L2 04/14/2013   Family Hx:  Family History  Problem Relation Age of Onset  . Heart disease Father   . Diabetes Brother   . Heart disease Mother   . Cirrhosis Father   . Diabetes Father   . Thyroid disease Mother   . Diabetes Mother   . Kidney disease Brother     from diabetes  . Breast cancer Maternal Aunt   . Breast cancer Maternal Aunt   . Melanoma Maternal Aunt   . Melanoma Maternal Aunt   . Other Maternal Aunt     lupus anticardiolipin ab syndrome    SOCIAL HISTORY:    Lives with her parents and brother.  Has excellent social support.    REVIEW OF SYSTEMS Constitutional feels well.  Cardiovascular  No chest pain, shortness of breath, or edema  Pulmonary  No cough or wheeze.  Gastro Intestinal  No nausea, vomitting, or diarrhoea. No bright red blood per rectum, No change in bowel movement, or constipation. No bloating. Genito Urinary   Denies vaginal bleeding Musculo Skeletal  No myalgia, arthralgia, joint swelling.  Lower back pain  Neurologic  Lower extremity weakness, Significant  numbness, limited  gait,  Vitals:  Blood pressure 120/82, pulse 103, temperature 97.5 F (36.4 C), temperature source Oral, resp. rate 24, height 5\' 11"   (1.803 m), weight 422 lb (191.418 kg).  Physical Exam:  General:  Well developed, morbidly obese in NAD. Significant amount of hair on her head. Patient in good spirits, appropriate mood and affect. Chest:  CTA Cardiac RRR. Abdomen: Obese soft, Faint BS.  No palpable masses Extremities:  2-3+ bilateral edema Pelvic:  Examination limited by body habitus.  No palpable vaginal masses, no discharge or bleeding..  Back:  No CVAT

## 2014-04-14 ENCOUNTER — Other Ambulatory Visit: Payer: Self-pay

## 2014-04-14 DIAGNOSIS — C541 Malignant neoplasm of endometrium: Secondary | ICD-10-CM

## 2014-04-18 ENCOUNTER — Other Ambulatory Visit: Payer: Self-pay | Admitting: Oncology

## 2014-04-19 ENCOUNTER — Ambulatory Visit (HOSPITAL_BASED_OUTPATIENT_CLINIC_OR_DEPARTMENT_OTHER): Payer: BC Managed Care – PPO | Admitting: Oncology

## 2014-04-19 ENCOUNTER — Encounter: Payer: Self-pay | Admitting: Oncology

## 2014-04-19 ENCOUNTER — Other Ambulatory Visit (HOSPITAL_BASED_OUTPATIENT_CLINIC_OR_DEPARTMENT_OTHER): Payer: BC Managed Care – PPO

## 2014-04-19 VITALS — BP 114/72 | HR 90 | Temp 97.8°F | Resp 18 | Ht 71.0 in | Wt >= 6400 oz

## 2014-04-19 DIAGNOSIS — G8929 Other chronic pain: Secondary | ICD-10-CM

## 2014-04-19 DIAGNOSIS — C541 Malignant neoplasm of endometrium: Secondary | ICD-10-CM

## 2014-04-19 DIAGNOSIS — M545 Low back pain, unspecified: Secondary | ICD-10-CM

## 2014-04-19 DIAGNOSIS — I82409 Acute embolism and thrombosis of unspecified deep veins of unspecified lower extremity: Secondary | ICD-10-CM

## 2014-04-19 DIAGNOSIS — K219 Gastro-esophageal reflux disease without esophagitis: Secondary | ICD-10-CM

## 2014-04-19 DIAGNOSIS — D649 Anemia, unspecified: Secondary | ICD-10-CM

## 2014-04-19 DIAGNOSIS — Z7901 Long term (current) use of anticoagulants: Secondary | ICD-10-CM

## 2014-04-19 DIAGNOSIS — T451X5A Adverse effect of antineoplastic and immunosuppressive drugs, initial encounter: Secondary | ICD-10-CM

## 2014-04-19 DIAGNOSIS — G620 Drug-induced polyneuropathy: Secondary | ICD-10-CM

## 2014-04-19 DIAGNOSIS — C786 Secondary malignant neoplasm of retroperitoneum and peritoneum: Secondary | ICD-10-CM

## 2014-04-19 DIAGNOSIS — R188 Other ascites: Secondary | ICD-10-CM

## 2014-04-19 DIAGNOSIS — D696 Thrombocytopenia, unspecified: Secondary | ICD-10-CM

## 2014-04-19 LAB — COMPREHENSIVE METABOLIC PANEL (CC13)
ALBUMIN: 3.5 g/dL (ref 3.5–5.0)
ALT: 28 U/L (ref 0–55)
ANION GAP: 11 meq/L (ref 3–11)
AST: 20 U/L (ref 5–34)
Alkaline Phosphatase: 83 U/L (ref 40–150)
BUN: 12 mg/dL (ref 7.0–26.0)
CALCIUM: 10 mg/dL (ref 8.4–10.4)
CO2: 31 meq/L — AB (ref 22–29)
Chloride: 100 mEq/L (ref 98–109)
Creatinine: 0.8 mg/dL (ref 0.6–1.1)
EGFR: 89 mL/min/{1.73_m2} — ABNORMAL LOW (ref >90)
Glucose: 108 mg/dl (ref 70–140)
POTASSIUM: 4 meq/L (ref 3.5–5.1)
Sodium: 142 mEq/L (ref 136–145)
TOTAL PROTEIN: 7.2 g/dL (ref 6.4–8.3)
Total Bilirubin: 0.4 mg/dL (ref 0.20–1.20)

## 2014-04-19 LAB — CBC WITH DIFFERENTIAL/PLATELET
BASO%: 0.1 % (ref 0.0–2.0)
Basophils Absolute: 0 10*3/uL (ref 0.0–0.1)
EOS ABS: 0.1 10*3/uL (ref 0.0–0.5)
EOS%: 1.6 % (ref 0.0–7.0)
HCT: 39.5 % (ref 34.8–46.6)
HGB: 12.9 g/dL (ref 11.6–15.9)
LYMPH%: 21.4 % (ref 14.0–49.7)
MCH: 32.9 pg (ref 25.1–34.0)
MCHC: 32.7 g/dL (ref 31.5–36.0)
MCV: 100.8 fL (ref 79.5–101.0)
MONO#: 0.5 10*3/uL (ref 0.1–0.9)
MONO%: 6.5 % (ref 0.0–14.0)
NEUT#: 4.9 10*3/uL (ref 1.5–6.5)
NEUT%: 70.4 % (ref 38.4–76.8)
PLATELETS: 157 10*3/uL (ref 145–400)
RBC: 3.92 10*6/uL (ref 3.70–5.45)
RDW: 13.9 % (ref 11.2–14.5)
WBC: 7 10*3/uL (ref 3.9–10.3)
lymph#: 1.5 10*3/uL (ref 0.9–3.3)

## 2014-04-19 LAB — MAGNESIUM (CC13): Magnesium: 1.6 mg/dl (ref 1.5–2.5)

## 2014-04-19 MED ORDER — OXYCODONE-ACETAMINOPHEN 10-325 MG PO TABS: 1.0000 | ORAL_TABLET | Freq: Two times a day (BID) | ORAL | Status: DC | PRN

## 2014-04-19 MED ORDER — ETOPOSIDE 50 MG PO CAPS: 100.0000 mg | ORAL_CAPSULE | Freq: Every day | ORAL | Status: DC

## 2014-04-19 NOTE — Progress Notes (Signed)
OFFICE PROGRESS NOTE   04/19/2014   Physicians:(K.Khan), W.Brewster, Orpah Melter, M.Tsuei  INTERVAL HISTORY:  Patient is seen, together with sister and aunt, to discuss resuming treatment of advanced endometrial cancer. She has been on treatment break since reaction with last carboplatin given 02-10-14, with repeat CT AP 03-10-14 and follow up with Dr Skeet Latch on 04-12-14. The CT showed fairly stable disease including liver capsule, peritoneum/ omentum, adjacent to sigmoid colon and loculated fluid in left abdomen. CA 125 03-17-14 was 8. Dr Skeet Latch recommends continuing treatment, with a number of options available.  Lennon has felt less tired since on treatment break, and has enjoyed being without the PICC, which she had for almost a year. She continues to take regular antiemetics, as she has had recurrent nausea if she tries without this. She sat up in living room for 3 hours on Christmas, and has been walking a little more in home. She has had no fever or symptoms of infection, no diarrhea, no increased SOB. She has some chronic back pain, uses occasional Percocet, last filled in Sept.   No central line now Flu vaccine done  ONCOLOGIC HISTORY Patient developed excessive uterine bleeding. Dilatation and curettage with hysteroscopy documented grade 1 endometrioid endometrial adenocarcinoma. Due to morbid obesity Mirena IUD was placed along with Aygestin. Patient had a dramatic directed weight loss with nutritional support she went from 523 pounds to 359 pounds in 10/2009. On 12/27/2009 she underwent a robotic-assisted laparoscopic hysterectomy, bilateral salpingo-oophorectomy, and mini laparotomy through the umbilical port with morcellation of uterus within about the delivery of the uterus. The final pathology revealed an endometrial adenocarcinoma grade 2 with invasion limited to 1 mm of the myometrium.  In 09/2012 when she developed right upper quadrant pain, presumed cholelithiasis, with  laparoscopic evaluation finding peritoneal carcinomatosis and ascites. Omental biopsies had metastatic adenocarcinoma consistent with recurrent endometrial carcinoma. She had 12 cycles taxol carboplatin from 11-04-12 thru 07-24-13 with neulasta day 2. CT AP after cycle 12 showed stable to minimally improved disease. With progressive neuropathy, she continued single agent carboplatin, cycle 9 on 02-11-14. CT AP showed basically stable disease compared with 07-2013, with small volume disease at liver capsule, omentum/peritoneum, adjacent to sigmoid colon and loculated fluid left abdomen.  She initially required multiple paracenteses for ascites, none needed since Aug 2014.    Review of systems as above, also: No bleeding. No swelling LEs. No increased SOB. Bowels ok. Appetite stable.  Remainder of 10 point Review of Systems negative.  Objective:  Vital signs in last 24 hours:  BP 114/72 mmHg  Pulse 90  Temp(Src) 97.8 F (36.6 C) (Oral)  Resp 18  Ht 5' 11"  (1.803 m)  Wt 436 lb 3.2 oz (197.859 kg)  BMI 60.86 kg/m2 Weight up 14 lbs from 02-2014. Alert, oriented and appropriate. In WC, respirations not labored RA. No alopecia.  HEENT:PERRL, sclerae not icteric. Oral mucosa moist without lesions, posterior pharynx clear.  Neck supple. No JVD.  Lymphatics:no cervical,supraclavicular adenopathy Resp: clear to auscultation bilaterally and normal percussion bilaterally, BS diminished in bases with habitus Cardio: regular rate and rhythm. No gallop. QI:WLNLGXQ morbidly obese,  soft, nontender, not obviously distended, cannot appreciate mass or organomegaly. Normally active bowel sounds.  Musculoskeletal/ Extremities: without pitting edema, cords, tenderness. Stasis dermatitis changes lower legs. Neuro: no increased peripheral neuropathy. Otherwise nonfocal on exam in [facility]. PSYCH appropriate mood and affect Skin without rash, ecchymosis, petechiae   Lab Results:  Results for orders placed or  performed in visit on 04/19/14  CBC with Differential  Result Value Ref Range   WBC 7.0 3.9 - 10.3 10e3/uL   NEUT# 4.9 1.5 - 6.5 10e3/uL   HGB 12.9 11.6 - 15.9 g/dL   HCT 39.5 34.8 - 46.6 %   Platelets 157 145 - 400 10e3/uL   MCV 100.8 79.5 - 101.0 fL   MCH 32.9 25.1 - 34.0 pg   MCHC 32.7 31.5 - 36.0 g/dL   RBC 3.92 3.70 - 5.45 10e6/uL   RDW 13.9 11.2 - 14.5 %   lymph# 1.5 0.9 - 3.3 10e3/uL   MONO# 0.5 0.1 - 0.9 10e3/uL   Eosinophils Absolute 0.1 0.0 - 0.5 10e3/uL   Basophils Absolute 0.0 0.0 - 0.1 10e3/uL   NEUT% 70.4 38.4 - 76.8 %   LYMPH% 21.4 14.0 - 49.7 %   MONO% 6.5 0.0 - 14.0 %   EOS% 1.6 0.0 - 7.0 %   BASO% 0.1 0.0 - 2.0 %  Comprehensive metabolic panel (Cmet) - [facility]  Result Value Ref Range   Sodium 142 136 - 145 mEq/L   Potassium 4.0 3.5 - 5.1 mEq/L   Chloride 100 98 - 109 mEq/L   CO2 31 (H) 22 - 29 mEq/L   Glucose 108 70 - 140 mg/dl   BUN 12.0 7.0 - 26.0 mg/dL   Creatinine 0.8 0.6 - 1.1 mg/dL   Total Bilirubin 0.40 0.20 - 1.20 mg/dL   Alkaline Phosphatase 83 40 - 150 U/L   AST 20 5 - 34 U/L   ALT 28 0 - 55 U/L   Total Protein 7.2 6.4 - 8.3 g/dL   Albumin 3.5 3.5 - 5.0 g/dL   Calcium 10.0 8.4 - 10.4 mg/dL   Anion Gap 11 3 - 11 mEq/L   EGFR 89 (L) >90 ml/min/1.73 m2  Magnesium  Result Value Ref Range   Magnesium 1.6 1.5 - 2.5 mg/dl     Studies/Results:  No results found. CT AP 03-10-14 as noted  Medications: I have reviewed the patient's current medications.  DISCUSSION: we have discussed different options for systemic treatment, including doxil + avastin, single agent avastin, oral VP16, gemzar. I understand that ER PR testing has been requested on previous path by gyn oncology, that result not located in EMR now but will likely be more useful later. Doxil and gemzar would require another central line; skin reaction with doxil may be more difficult with morbid obesity. I have suggested that we try oral etoposide as next intervention; she and family have had  teaching for this drug by RN and she is in agreement with this recommendation. Prescription for 150 mg daily x 21 days every 28 days to [facility] for initial review (this dose 50 mg/m2 at 3.15 m2).  Next appointment will be set up when we know start date for etoposide, to see MD with labs 1-2 weeks after start. She will take the etoposide at hs, 30-60 min after antiemetic.  Dr Skeet Latch recommended PET as next imaging.  Assessment/Plan: 1.Endometrial carcinoma IA grade 2 at diagnosis March 2009, treated nonsurgically until weight loss of ~ 165 lbs allowed hysterectomy BSO 12-2009. She was found to have recurrent disease with peritoneal carcinomatosis and ascites in 09-2012, with chemotherapy used continuously from 10-2012 thru 02-10-14, total 21 cycles carboplatin (with taxol x first 12 cycles) until reaction with last treatment. CT now with essentially stable small volume disease. Recent short treatment break has been helpful and she is now in agreement with beginning oral etoposide, plan as above.  Treatment is in attempt to control disease; she and family understand that this will not be curative. 2. recent pansensitive Klebsiella UTI, resolved 3.PICC  placed by IR 03-2013 and kept thru Nov 2015, now out. Note peripheral veins adequate for IVs in hospital recently. 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 No marked ascites on recent CT 6.DVT with previous PAC, arixtra continued with immobility and active gyn cancer. 7.GERD controlled on protonix and carafate 8. Cholelithiasis by CT 9.compression fractures T 10-31 Jan 2013 after fall, post kyphoplasties. Degenerative arthritis. Uses occasional percocet 10. C diff diarrhea requiring 2 cycles antibiotics to clear, June 2015.  11.flu vaccine done [email or web address] thrombocytopenia and anemia in hospital improved 13.no advance directives per EMR  All questions answered. I am glad to  speak with her mother by phone if other concerns when they discuss with her.  Time spent 35 min including >50% counseling and coordination of care.     Prakriti Carignan P, MD   04/19/2014, 2:02 PM

## 2014-04-20 ENCOUNTER — Other Ambulatory Visit: Payer: Self-pay | Admitting: Oncology

## 2014-04-20 ENCOUNTER — Other Ambulatory Visit: Payer: Self-pay | Admitting: *Deleted

## 2014-04-20 DIAGNOSIS — G629 Polyneuropathy, unspecified: Secondary | ICD-10-CM

## 2014-04-20 MED ORDER — GABAPENTIN 100 MG PO CAPS: 200.0000 mg | ORAL_CAPSULE | Freq: Three times a day (TID) | ORAL | Status: DC

## 2014-04-20 NOTE — Progress Notes (Signed)
ADDENDUM  Etoposide dose initially will be 100 mg daily x 21 every 28 days as patient has been extensively treated to date.  Godfrey Pick, MD

## 2014-04-27 ENCOUNTER — Telehealth: Payer: Self-pay

## 2014-04-27 DIAGNOSIS — C541 Malignant neoplasm of endometrium: Secondary | ICD-10-CM

## 2014-04-27 NOTE — Telephone Encounter (Signed)
Spoke with [name]'s mother and [name] will begin the oral Etoposide tonight 04-27-14. Scheduled follow up for 05-07-14 at 1300 with Dr. Tobin Chad.  Mother verbalized understanding.

## 2014-04-27 NOTE — Telephone Encounter (Signed)
-----   Message from Gordy Levan, MD sent at 04/20/2014 10:02 PM EST ----- Will need to see LL ~ 1-2 weeks after starting oral etoposide, with CBC cmet. POF not done as start date not known yet.  RN please also follow up with her by phone a couple of days after starting etoposide, to be sure doing ok  thanks

## 2014-04-28 ENCOUNTER — Other Ambulatory Visit: Payer: Self-pay | Admitting: Oncology

## 2014-04-29 ENCOUNTER — Other Ambulatory Visit: Payer: Self-pay | Admitting: *Deleted

## 2014-04-29 DIAGNOSIS — B372 Candidiasis of skin and nail: Secondary | ICD-10-CM

## 2014-04-29 DIAGNOSIS — C541 Malignant neoplasm of endometrium: Secondary | ICD-10-CM

## 2014-04-29 MED ORDER — PROCHLORPERAZINE MALEATE 10 MG PO TABS: 10.0000 mg | ORAL_TABLET | Freq: Four times a day (QID) | ORAL | Status: DC | PRN

## 2014-04-29 MED ORDER — NYSTATIN 100000 UNIT/GM EX OINT: 1.0000 "application " | TOPICAL_OINTMENT | Freq: Two times a day (BID) | CUTANEOUS | Status: AC

## 2014-05-04 ENCOUNTER — Telehealth: Payer: Self-pay | Admitting: *Deleted

## 2014-05-04 NOTE — Telephone Encounter (Signed)
Received VM from Mirca's mother stating that patient has a slight cough last night and has a sore throat this morning. She states patient has not been running a fever or having any other symptoms. She was asking if it is okay to take OTC NyQuil since Laniya recently started oral Etoposide. I called and spoke with Lattie Haw in [place] and she states there are no interactions with etopside and DayQuil or NyQuil. Called back and let Rosanne's mother know this. Told her to please call us back if Birttany does develop a fever or any other cold/flu symptoms. Confirmed appt time for this Friday, 05/07/14.

## 2014-05-06 ENCOUNTER — Other Ambulatory Visit: Payer: Self-pay | Admitting: Oncology

## 2014-05-06 DIAGNOSIS — C541 Malignant neoplasm of endometrium: Secondary | ICD-10-CM

## 2014-05-07 ENCOUNTER — Other Ambulatory Visit (HOSPITAL_BASED_OUTPATIENT_CLINIC_OR_DEPARTMENT_OTHER): Payer: BLUE CROSS/BLUE SHIELD

## 2014-05-07 ENCOUNTER — Encounter: Payer: Self-pay | Admitting: Oncology

## 2014-05-07 ENCOUNTER — Telehealth: Payer: Self-pay | Admitting: Oncology

## 2014-05-07 ENCOUNTER — Ambulatory Visit (HOSPITAL_BASED_OUTPATIENT_CLINIC_OR_DEPARTMENT_OTHER): Payer: BLUE CROSS/BLUE SHIELD | Admitting: Oncology

## 2014-05-07 VITALS — BP 107/72 | HR 92 | Temp 98.4°F | Resp 20 | Ht 71.0 in | Wt >= 6400 oz

## 2014-05-07 DIAGNOSIS — J069 Acute upper respiratory infection, unspecified: Secondary | ICD-10-CM

## 2014-05-07 DIAGNOSIS — E669 Obesity, unspecified: Secondary | ICD-10-CM

## 2014-05-07 DIAGNOSIS — C786 Secondary malignant neoplasm of retroperitoneum and peritoneum: Secondary | ICD-10-CM

## 2014-05-07 DIAGNOSIS — C541 Malignant neoplasm of endometrium: Secondary | ICD-10-CM

## 2014-05-07 DIAGNOSIS — D689 Coagulation defect, unspecified: Secondary | ICD-10-CM

## 2014-05-07 DIAGNOSIS — B9789 Other viral agents as the cause of diseases classified elsewhere: Secondary | ICD-10-CM

## 2014-05-07 DIAGNOSIS — E876 Hypokalemia: Secondary | ICD-10-CM

## 2014-05-07 DIAGNOSIS — Z86718 Personal history of other venous thrombosis and embolism: Secondary | ICD-10-CM

## 2014-05-07 LAB — CBC WITH DIFFERENTIAL/PLATELET
BASO%: 0.2 % (ref 0.0–2.0)
BASOS ABS: 0 10*3/uL (ref 0.0–0.1)
EOS%: 1.3 % (ref 0.0–7.0)
Eosinophils Absolute: 0.1 10*3/uL (ref 0.0–0.5)
HCT: 36.3 % (ref 34.8–46.6)
HGB: 11.9 g/dL (ref 11.6–15.9)
LYMPH%: 20.3 % (ref 14.0–49.7)
MCH: 32.4 pg (ref 25.1–34.0)
MCHC: 32.8 g/dL (ref 31.5–36.0)
MCV: 98.9 fL (ref 79.5–101.0)
MONO#: 0.2 10*3/uL (ref 0.1–0.9)
MONO%: 3.3 % (ref 0.0–14.0)
NEUT#: 4.6 10*3/uL (ref 1.5–6.5)
NEUT%: 74.9 % (ref 38.4–76.8)
PLATELETS: 152 10*3/uL (ref 145–400)
RBC: 3.67 10*6/uL — ABNORMAL LOW (ref 3.70–5.45)
RDW: 13.8 % (ref 11.2–14.5)
WBC: 6.1 10*3/uL (ref 3.9–10.3)
lymph#: 1.2 10*3/uL (ref 0.9–3.3)

## 2014-05-07 LAB — COMPREHENSIVE METABOLIC PANEL (CC13)
ALBUMIN: 3.3 g/dL — AB (ref 3.5–5.0)
ALT: 19 U/L (ref 0–55)
AST: 16 U/L (ref 5–34)
Alkaline Phosphatase: 79 U/L (ref 40–150)
Anion Gap: 12 mEq/L — ABNORMAL HIGH (ref 3–11)
BILIRUBIN TOTAL: 0.43 mg/dL (ref 0.20–1.20)
BUN: 14.1 mg/dL (ref 7.0–26.0)
CALCIUM: 9.2 mg/dL (ref 8.4–10.4)
CHLORIDE: 99 meq/L (ref 98–109)
CO2: 29 meq/L (ref 22–29)
CREATININE: 0.8 mg/dL (ref 0.6–1.1)
EGFR: >90 mL/min/{1.73_m2} (ref >90)
Glucose: 126 mg/dl (ref 70–140)
Potassium: 3.9 mEq/L (ref 3.5–5.1)
SODIUM: 140 meq/L (ref 136–145)
Total Protein: 6.9 g/dL (ref 6.4–8.3)

## 2014-05-07 MED ORDER — POTASSIUM CHLORIDE ER 10 MEQ PO TBCR: 20.0000 meq | EXTENDED_RELEASE_TABLET | Freq: Every day | ORAL | Status: DC

## 2014-05-07 MED ORDER — PANTOPRAZOLE SODIUM 40 MG PO TBEC: 40.0000 mg | DELAYED_RELEASE_TABLET | Freq: Every day | ORAL | Status: AC

## 2014-05-07 NOTE — Progress Notes (Signed)
OFFICE PROGRESS NOTE     Physicians:(K.Khan), W.Brewster, Orpah Melter, M.Tsuei  INTERVAL HISTORY:  Patient is seen, together with mother and aunt, in continuing attention to advanced endometrial cancer, presently day 11 cycle 1 oral etoposide, planned 100 mg daily for 21 days every 28 days. She is tolerating the etoposide well overall and blood counts are in good range today. Plan is for PET with next imaging.  Patient began cycle 1 etoposide on 04-27-14, taking this qhs and premedicating with compazine one hour prior. She continues to use compazine regularly each AM, as she has had some nausea when she previously tried to omit that dose. She needed prn phenergan only once this week. Camaria has had sinus congestion and clear drainage for last few days, without fever but some cough intermittently, no SOB. Mother had similar URI symptoms initially. Pain left lateral abdomen x1 thought associated with coughing.  Otherwise, energy seems at baseline and she has been able to eat. Bowels are moving regularly. She has no discomfort at site of previous PICC LUE. She continues LMW heparin, which is appropriate due to active gyn malignancy, morbid obesity and very sedentary situation.     PICC out Flu vaccine done  ONCOLOGIC HISTORY Patient developed excessive uterine bleeding. Dilatation and curettage with hysteroscopy documented grade 1 endometrioid endometrial adenocarcinoma. Due to morbid obesity Mirena IUD was placed along with Aygestin. Patient had a dramatic directed weight loss with nutritional support she went from 523 pounds to 359 pounds in 10/2009. On 12/27/2009 she underwent a robotic-assisted laparoscopic hysterectomy, bilateral salpingo-oophorectomy, and mini laparotomy through the umbilical port with morcellation of uterus within about the delivery of the uterus. The final pathology revealed an endometrial adenocarcinoma grade 2 with invasion limited to 1 mm of the myometrium.  In  09/2012 when she developed right upper quadrant pain, presumed cholelithiasis, with laparoscopic evaluation finding peritoneal carcinomatosis and ascites. Omental biopsies had metastatic adenocarcinoma consistent with recurrent endometrial carcinoma. She had 12 cycles taxol carboplatin from 11-04-12 thru 07-24-13 with neulasta day 2. CT AP after cycle 12 showed stable to minimally improved disease. With progressive neuropathy, she continued single agent carboplatin, cycle 9 on 02-11-14. CT AP showed basically stable disease compared with 07-2013, with small volume disease at liver capsule, omentum/peritoneum, adjacent to sigmoid colon and loculated fluid left abdomen.  She initially required multiple paracenteses for ascites, none needed since Aug 2014. Patient continued carboplatin thru 02-10-14. CT AP 03-10-14 had fairly stable disease; gyn oncology recommended continuing treatment with another regimen. Oral etoposide was chosen to avoid another central line for present, cycle 1 begun 04-27-14.    Review of systems as above, also: No bleeding. No increased swelling LE.  No rash including left trunk. Note patient unable to bathe herself due to size, aunt bathes her. Remainder of 10 point Review of Systems negative.  Objective:  Vital signs in last 24 hours:  BP 107/72 mmHg  Pulse 92  Temp(Src) 98.4 F (36.9 C) (Oral)  Resp 20  Ht 5' 11"  (1.803 m)  Wt 436 lb 4.8 oz (197.904 kg)  BMI 60.88 kg/m2 Weight stable Alert, oriented and appropriate. In [place]. Respirations not labored RA. No alopecia. Sounds slightly nasally congested and raspy cough x1, but does not appear acutely ill.  HEENT:PERRL, sclerae not icteric. Oral mucosa moist without lesions, posterior pharynx some erythema without exudate. Nasal turbinates boggy without purulent drainage. Neck supple. No JVD.  Lymphatics:no cervical,supraclavicular adenopathy Resp: diminished BS bases consistent with habitus, no wheezes or crackles Cardio:  regular rate and rhythm. No gallop. GI: soft, nontender, not distended, no mass or organomegaly. Normally active bowel sounds. Surgical incision not remarkable. Musculoskeletal/ Extremities: without pitting edema, cords. Tender anterior shins bilaterally, chronic. No tenderness left lateral chest/ ribs. Neuro: no peripheral neuropathy. Otherwise nonfocal Skin without rash , ecchymosis, petechiae. Site of LUE PICC closed and not tender.   Lab Results:  Results for orders placed or performed in visit on 05/07/14  CBC with Differential  Result Value Ref Range   WBC 6.1 3.9 - 10.3 10e3/uL   NEUT# 4.6 1.5 - 6.5 10e3/uL   HGB 11.9 11.6 - 15.9 g/dL   HCT 36.3 34.8 - 46.6 %   Platelets 152 145 - 400 10e3/uL   MCV 98.9 79.5 - 101.0 fL   MCH 32.4 25.1 - 34.0 pg   MCHC 32.8 31.5 - 36.0 g/dL   RBC 3.67 (L) 3.70 - 5.45 10e6/uL   RDW 13.8 11.2 - 14.5 %   lymph# 1.2 0.9 - 3.3 10e3/uL   MONO# 0.2 0.1 - 0.9 10e3/uL   Eosinophils Absolute 0.1 0.0 - 0.5 10e3/uL   Basophils Absolute 0.0 0.0 - 0.1 10e3/uL   NEUT% 74.9 38.4 - 76.8 %   LYMPH% 20.3 14.0 - 49.7 %   MONO% 3.3 0.0 - 14.0 %   EOS% 1.3 0.0 - 7.0 %   BASO% 0.2 0.0 - 2.0 %  Comprehensive metabolic panel (Cmet) - [facility]  Result Value Ref Range   Sodium 140 136 - 145 mEq/L   Potassium 3.9 3.5 - 5.1 mEq/L   Chloride 99 98 - 109 mEq/L   CO2 29 22 - 29 mEq/L   Glucose 126 70 - 140 mg/dl   BUN 14.1 7.0 - 26.0 mg/dL   Creatinine 0.8 0.6 - 1.1 mg/dL   Total Bilirubin 0.43 0.20 - 1.20 mg/dL   Alkaline Phosphatase 79 40 - 150 U/L   AST 16 5 - 34 U/L   ALT 19 0 - 55 U/L   Total Protein 6.9 6.4 - 8.3 g/dL   Albumin 3.3 (L) 3.5 - 5.0 g/dL   Calcium 9.2 8.4 - 10.4 mg/dL   Anion Gap 12 (H) 3 - 11 mEq/L   EGFR >90 >90 ml/min/1.73 m2     Studies/Results:  No results found.  Medications: I have reviewed the patient's current medications. Continue potassium 10 mEq daily. Refill protonix  DISCUSSION: with URI symptoms and some cough, will hold  etoposide today, 1-16 and 1-17, then resume early next week as long as symptoms improving.   Assessment/Plan:  1.Endometrial carcinoma IA grade 2 at diagnosis March 2009, treated nonsurgically until weight loss of ~ 165 lbs allowed hysterectomy BSO 12-2009. She was found to have recurrent disease with peritoneal carcinomatosis and ascites in 09-2012, with chemotherapy used continuously from 10-2012 thru 02-10-14, total 21 cycles carboplatin (with taxol x first 12 cycles) until reaction with last treatment. CT 02-2014 with essentially stable small volume disease. Cycle 1 oral etoposide begun 04-27-14. Treatment is in attempt to control disease; she and family understand that this will not be curative. I will see her back with labs shortly prior to start of cycle 2. 2.apparent viral URI: hold etoposide thru this weekend, then resume if symptoms improved 3.PICC placed by IR 03-2013 and kept thru Nov 2015, now out. Note peripheral veins adequate for IVs in hospital recently. 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 No marked ascites on recent CT. I  do not see TFTs in this EMR back at least a year, and will add these to next labs. 6.DVT with previous PAC, arixtra continued with immobility and active gyn cancer. 7.GERD controlled on protonix and carafate 8. Cholelithiasis by CT 9.compression fractures T 10-31 Jan 2013 after fall, post kyphoplasties. Degenerative arthritis. Uses occasional percocet 10. C diff diarrhea requiring 2 cycles antibiotics to clear, June 2015.  11.flu vaccine done [email or web address] thrombocytopenia and anemia in hospital improved 13.no advance directives per EMR   Paitent and family understand recommendations and plan, and know they can call if any concerns prior to next scheduled visit Time spent 25 min including >50% counseling and coordination of care   Aubreyana Saltz P, MD   05/07/2014, 1:14 PM

## 2014-05-07 NOTE — Telephone Encounter (Signed)
, °

## 2014-05-10 ENCOUNTER — Other Ambulatory Visit: Payer: Self-pay | Admitting: *Deleted

## 2014-05-10 NOTE — Telephone Encounter (Signed)
THIS REFILL REQUEST FOR ETOPOSIDE WAS GIVEN TO DR.LIVESAY'S NURSE, LOUISE ARCHAMBAULT,RN.

## 2014-05-13 ENCOUNTER — Other Ambulatory Visit: Payer: Self-pay | Admitting: *Deleted

## 2014-05-13 ENCOUNTER — Telehealth: Payer: Self-pay | Admitting: *Deleted

## 2014-05-13 DIAGNOSIS — C541 Malignant neoplasm of endometrium: Secondary | ICD-10-CM

## 2014-05-13 MED ORDER — ETOPOSIDE 50 MG PO CAPS: 100.0000 mg | ORAL_CAPSULE | Freq: Every day | ORAL | Status: DC

## 2014-05-13 NOTE — Telephone Encounter (Signed)
Received message from [place], pharmacy tech @ [facility] requesting a new prescription for Etoposide 50mg .  Message sent to Dr. Marko Plume for review. [place]    Phone    [phone]   ;     Fax      [phone].

## 2014-05-17 ENCOUNTER — Other Ambulatory Visit: Payer: Self-pay

## 2014-05-17 ENCOUNTER — Telehealth: Payer: Self-pay | Admitting: *Deleted

## 2014-05-17 DIAGNOSIS — C541 Malignant neoplasm of endometrium: Secondary | ICD-10-CM

## 2014-05-17 MED ORDER — AZITHROMYCIN 250 MG PO TABS: ORAL_TABLET | ORAL | Status: DC

## 2014-05-17 MED ORDER — PROCHLORPERAZINE MALEATE 10 MG PO TABS: 10.0000 mg | ORAL_TABLET | Freq: Four times a day (QID) | ORAL | Status: DC | PRN

## 2014-05-17 NOTE — Telephone Encounter (Signed)
Spoke with [name] and she is afebrile. Temperature is 97.9 She is coughing up green phlegm. Nasal drainage is green Encouraged her to drink 8 oz of fluid at least every 2 hours to help loosen the secretions. Will discuss with Dr. Marko Plume.

## 2014-05-17 NOTE — Addendum Note (Signed)
Addended by: Baruch Merl on: 05/17/2014 01:57 PM   Modules accepted: Orders

## 2014-05-17 NOTE — Telephone Encounter (Signed)
MESSAGE ON VM IN TRIAGE  " I have been dealing with this ongoing cold and cough- and taken 3 bottles of Nyquil "  " could Dr Edwyna Shell call in an ABX b/c I will not be able to get of my house until late tomorrow"  " I can't get this stuff out of me'  Return [phone].  THIS NOTE WILL BE SENT TO MD AND NURSE AT DESK FOR FOLLOW UP

## 2014-05-17 NOTE — Telephone Encounter (Signed)
Told Ms. Broz that Dr. Marko Plume sent in a Z-Pack to her pharmacy.  Suggested talking with the pharmacist as to which Mucinex would be good for expectorant and cough supressant.  Ms. Uplinger verbalized understanding.

## 2014-05-25 ENCOUNTER — Other Ambulatory Visit: Payer: Self-pay | Admitting: Oncology

## 2014-05-27 ENCOUNTER — Ambulatory Visit (HOSPITAL_BASED_OUTPATIENT_CLINIC_OR_DEPARTMENT_OTHER): Payer: BLUE CROSS/BLUE SHIELD | Admitting: Oncology

## 2014-05-27 ENCOUNTER — Telehealth: Payer: Self-pay | Admitting: Oncology

## 2014-05-27 ENCOUNTER — Other Ambulatory Visit (HOSPITAL_BASED_OUTPATIENT_CLINIC_OR_DEPARTMENT_OTHER): Payer: BLUE CROSS/BLUE SHIELD

## 2014-05-27 ENCOUNTER — Encounter: Payer: Self-pay | Admitting: Oncology

## 2014-05-27 VITALS — BP 101/72 | HR 90 | Temp 98.2°F | Resp 18 | Ht 71.0 in | Wt >= 6400 oz

## 2014-05-27 DIAGNOSIS — E669 Obesity, unspecified: Secondary | ICD-10-CM

## 2014-05-27 DIAGNOSIS — C786 Secondary malignant neoplasm of retroperitoneum and peritoneum: Secondary | ICD-10-CM

## 2014-05-27 DIAGNOSIS — C541 Malignant neoplasm of endometrium: Secondary | ICD-10-CM

## 2014-05-27 DIAGNOSIS — I82401 Acute embolism and thrombosis of unspecified deep veins of right lower extremity: Secondary | ICD-10-CM

## 2014-05-27 DIAGNOSIS — C801 Malignant (primary) neoplasm, unspecified: Secondary | ICD-10-CM

## 2014-05-27 DIAGNOSIS — R946 Abnormal results of thyroid function studies: Secondary | ICD-10-CM

## 2014-05-27 DIAGNOSIS — T451X5A Adverse effect of antineoplastic and immunosuppressive drugs, initial encounter: Secondary | ICD-10-CM

## 2014-05-27 DIAGNOSIS — G620 Drug-induced polyneuropathy: Secondary | ICD-10-CM

## 2014-05-27 DIAGNOSIS — R7989 Other specified abnormal findings of blood chemistry: Secondary | ICD-10-CM

## 2014-05-27 LAB — COMPREHENSIVE METABOLIC PANEL (CC13)
ALBUMIN: 3.3 g/dL — AB (ref 3.5–5.0)
ALK PHOS: 79 U/L (ref 40–150)
ALT: 22 U/L (ref 0–55)
ANION GAP: 11 meq/L (ref 3–11)
AST: 19 U/L (ref 5–34)
BUN: 11.6 mg/dL (ref 7.0–26.0)
CHLORIDE: 101 meq/L (ref 98–109)
CO2: 30 meq/L — AB (ref 22–29)
Calcium: 9.6 mg/dL (ref 8.4–10.4)
Creatinine: 0.8 mg/dL (ref 0.6–1.1)
Glucose: 104 mg/dl (ref 70–140)
Potassium: 4.1 mEq/L (ref 3.5–5.1)
Sodium: 142 mEq/L (ref 136–145)
TOTAL PROTEIN: 6.9 g/dL (ref 6.4–8.3)
Total Bilirubin: 0.33 mg/dL (ref 0.20–1.20)

## 2014-05-27 LAB — CBC WITH DIFFERENTIAL/PLATELET
BASO%: 0.2 % (ref 0.0–2.0)
Basophils Absolute: 0 10*3/uL (ref 0.0–0.1)
EOS ABS: 0.1 10*3/uL (ref 0.0–0.5)
EOS%: 1.4 % (ref 0.0–7.0)
HCT: 39 % (ref 34.8–46.6)
HGB: 12.6 g/dL (ref 11.6–15.9)
LYMPH%: 22.9 % (ref 14.0–49.7)
MCH: 32.9 pg (ref 25.1–34.0)
MCHC: 32.3 g/dL (ref 31.5–36.0)
MCV: 101.8 fL — ABNORMAL HIGH (ref 79.5–101.0)
MONO#: 0.5 10*3/uL (ref 0.1–0.9)
MONO%: 7.9 % (ref 0.0–14.0)
NEUT#: 4.2 10*3/uL (ref 1.5–6.5)
NEUT%: 67.6 % (ref 38.4–76.8)
Platelets: 160 10*3/uL (ref 145–400)
RBC: 3.83 10*6/uL (ref 3.70–5.45)
RDW: 16.1 % — AB (ref 11.2–14.5)
WBC: 6.2 10*3/uL (ref 3.9–10.3)
lymph#: 1.4 10*3/uL (ref 0.9–3.3)

## 2014-05-27 LAB — TSH CHCC: TSH: 3.004 m[IU]/L (ref 0.308–3.960)

## 2014-05-27 MED ORDER — PROCHLORPERAZINE MALEATE 10 MG PO TABS: 10.0000 mg | ORAL_TABLET | Freq: Four times a day (QID) | ORAL | Status: DC | PRN

## 2014-05-27 MED ORDER — ETOPOSIDE 50 MG PO CAPS: 100.0000 mg | ORAL_CAPSULE | Freq: Every day | ORAL | Status: DC

## 2014-05-27 NOTE — Telephone Encounter (Signed)
per pof to sch pt appt-gave pt copy of sch °

## 2014-05-27 NOTE — Progress Notes (Signed)
OFFICE PROGRESS NOTE   May 27, 2014   Physicians:K.Khan), W.Brewster, Orpah Melter, M.Tsuei  INTERVAL HISTORY:  Patient is seen, together with mother and aunt, in continuing attention to recurrent endometrial carcinoma, on oral etoposide since 04-27-2014, which she is tolerating well at 100 mg daily x 21 days every 28 days. She is to see Dr Skeet Latch next 09-02-14, and we plan PET shortly prior to that visit.  Patient is on break week of the etoposide, due to resume on 05-29-14. She has had only a couple of episodes of nausea in past 3 weeks, still taking antiemetic bid on regular schedule. Her bowels are moving well without diarrhea. She is up and ambulatory some at home. Respiratory congestion resolved with Zpack in late Jan.   PICC out Flu vaccine done  ONCOLOGIC HISTORY Patient developed excessive uterine bleeding. Dilatation and curettage with hysteroscopy documented grade 1 endometrioid endometrial adenocarcinoma. Due to morbid obesity Mirena IUD was placed along with Aygestin. Patient had a dramatic directed weight loss with nutritional support she went from 523 pounds to 359 pounds in 10/2009. On 12/27/2009 she underwent a robotic-assisted laparoscopic hysterectomy, bilateral salpingo-oophorectomy, and mini laparotomy through the umbilical port with morcellation of uterus within about the delivery of the uterus. The final pathology revealed an endometrial adenocarcinoma grade 2 with invasion limited to 1 mm of the myometrium.  In 09/2012 when she developed right upper quadrant pain, presumed cholelithiasis, with laparoscopic evaluation finding peritoneal carcinomatosis and ascites. Omental biopsies had metastatic adenocarcinoma consistent with recurrent endometrial carcinoma. She had 12 cycles taxol carboplatin from 11-04-12 thru 07-24-13 with neulasta day 2. CT AP after cycle 12 showed stable to minimally improved disease. With progressive neuropathy, she continued single agent carboplatin,  cycle 9 on 02-11-14. CT AP showed basically stable disease compared with 07-2013, with small volume disease at liver capsule, omentum/peritoneum, adjacent to sigmoid colon and loculated fluid left abdomen.  She initially required multiple paracenteses for ascites, none needed since Aug 2014. Patient continued carboplatin thru 02-10-14. CT AP 03-10-14 had fairly stable disease; gyn oncology recommended continuing treatment with another regimen. Oral etoposide was chosen to avoid another central line for present, cycle 1 begun 04-27-14   Review of systems as above, also: No fever or symptoms of infection.No SOB with present activity. No bleeding on LMW heparin. No LE swelling. Remainder of 10 point Review of Systems negative.  Objective:  Vital signs in last 24 hours:  BP 101/72 mmHg  Pulse 90  Temp(Src) 98.2 F (36.8 C) (Oral)  Resp 18  Ht _0  (1.803 m)  Wt 444 lb 3.2 oz (201.488 kg)  BMI 61.98 kg/m2  SpO2 100% Weight up another 8 lbs. Alert, oriented and appropriate. Ambulatory without assistance difficulty.  No alopecia  HEENT:PERRL, sclerae not icteric. Oral mucosa moist without lesions, posterior pharynx clear.  Neck supple. No JVD.  Lymphatics:no cervical,supraclavicular adenopathy Resp: clear to auscultation bilaterally and normal percussion bilaterally Cardio: regular rate and rhythm. No gallop. GI: abdomen obese, soft, nontender, not obviously distended. Unable to appreciate mass or organomegaly. Normally active bowel sounds.  Musculoskeletal/ Extremities:LE with chronic venous stasis changes, no pitting edema, cords, tenderness Neuro: no peripheral neuropathy. Otherwise nonfocal Skin without rash, ecchymosis, petechiae No central catheter now.  Lab Results:  Results for orders placed or performed in visit on 05/27/14  CBC with Differential  Result Value Ref Range   WBC 6.2 3.9 - 10.3 10e3/uL   NEUT# 4.2 1.5 - 6.5 10e3/uL   HGB 12.6 11.6 -  15.9 g/dL   HCT 39.0  34.8 - 46.6 %   Platelets 160 145 - 400 10e3/uL   MCV 101.8 (H) 79.5 - 101.0 fL   MCH 32.9 25.1 - 34.0 pg   MCHC 32.3 31.5 - 36.0 g/dL   RBC 3.83 3.70 - 5.45 10e6/uL   RDW 16.1 (H) 11.2 - 14.5 %   lymph# 1.4 0.9 - 3.3 10e3/uL   MONO# 0.5 0.1 - 0.9 10e3/uL   Eosinophils Absolute 0.1 0.0 - 0.5 10e3/uL   Basophils Absolute 0.0 0.0 - 0.1 10e3/uL   NEUT% 67.6 38.4 - 76.8 %   LYMPH% 22.9 14.0 - 49.7 %   MONO% 7.9 0.0 - 14.0 %   EOS% 1.4 0.0 - 7.0 %   BASO% 0.2 0.0 - 2.0 %  Comprehensive metabolic panel (Cmet) - [facility]  Result Value Ref Range   Sodium 142 136 - 145 mEq/L   Potassium 4.1 3.5 - 5.1 mEq/L   Chloride 101 98 - 109 mEq/L   CO2 30 (H) 22 - 29 mEq/L   Glucose 104 70 - 140 mg/dl   BUN 11.6 7.0 - 26.0 mg/dL   Creatinine 0.8 0.6 - 1.1 mg/dL   Total Bilirubin 0.33 0.20 - 1.20 mg/dL   Alkaline Phosphatase 79 40 - 150 U/L   AST 19 5 - 34 U/L   ALT 22 0 - 55 U/L   Total Protein 6.9 6.4 - 8.3 g/dL   Albumin 3.3 (L) 3.5 - 5.0 g/dL   Calcium 9.6 8.4 - 10.4 mg/dL   Anion Gap 11 3 - 11 mEq/L   EGFR >90 >90 ml/min/1.73 m2    TSH drawn because of progressive weight gain and available after visit is 3.004, this having been 0.442 in 02-2013  Studies/Results:  No results found.  Medications: I have reviewed the patient's current medications. She is not on thyroid supplement  DISCUSSION: Patient and family are pleased with how well she is tolerating the oral etoposide and in agreement with continuing. As long as she is clinically stable, reevaluation will be with PET shortly prior to seeing Dr Skeet Latch 09-02-14.  We will let her know about change in TSH and recommend that she follow up thyroid function tests with PCP in ~ 4 weeks.   Assessment/Plan:  1.Endometrial carcinoma IA grade 2 at diagnosis March 2009, treated nonsurgically until weight loss of ~ 165 lbs allowed hysterectomy BSO 12-2009. She was found to have recurrent disease with peritoneal carcinomatosis and ascites in 09-2012,  with chemotherapy used continuously from 10-2012 thru 02-10-14, total 21 cycles carboplatin (with taxol x first 12 cycles) until reaction with last treatment. CT 02-2014 with essentially stable small volume disease. Cycle 1 oral etoposide begun 04-27-14. Treatment is in attempt to control disease; she and family understand that this will not be curative. She will begin next cycle 05-29-14 x 21 days and I will see her with labs 06-24-14. 2.morbid obesity: weight again increasing, BMI 61. No marked ascites on recent CT. TSH upper limits normal, which is quite a bit higher than 02-2013. Follow up with PCP requested. 3.PICC placed by IR 03-2013 and kept thru Nov 2015, now out. Note peripheral veins adequate for IVs in hospital recently. 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 7.GERD controlled on protonix and carafate 8. Cholelithiasis by CT 9.compression fractures T 10-31 Jan 2013 after fall, post kyphoplasties. Degenerative arthritis. Uses  occasional percocet 10. C diff diarrhea requiring 2 cycles antibiotics to clear, June 2015.  11.flu vaccine done 12.no advance directives per EMR   Patient and family are comfortable with conversation and plans as above. Time spent 25 min including >50% counseling and coordination of care.   Ranvir Renovato P, MD   05/27/2014, 2:00 PM

## 2014-05-28 ENCOUNTER — Telehealth: Payer: Self-pay | Admitting: Medical Oncology

## 2014-05-28 NOTE — Telephone Encounter (Signed)
-----   Message from Janace Hoard, RN sent at 05/28/2014  2:35 PM EST -----   ----- Message -----    From: Gordy Levan, MD    Sent: 05/28/2014   9:47 AM      To: Baruch Merl, RN, Janace Hoard, RN, #  Labs seen and need follow up:  please let patient know that thyroid test from 05-27-14 came back quite a bit different than a year ago, not quite hypothyroid by this level but needs to be rechecked by PCP. Suggest she make appointment with PCP in ~ 4 weeks to recheck thyroid. Please send all labs from 05-27-14 and TSH from a year ago to Dr Orpah Melter (please confirm he is her primary).

## 2014-05-28 NOTE — Telephone Encounter (Signed)
I gave the information ot [name]'s mother and she confirmed Dr Olen Pel is her PCP. Labs faxed to Dr Olen Pel

## 2014-06-07 ENCOUNTER — Other Ambulatory Visit: Payer: Self-pay | Admitting: Oncology

## 2014-06-07 DIAGNOSIS — I82629 Acute embolism and thrombosis of deep veins of unspecified upper extremity: Secondary | ICD-10-CM

## 2014-06-23 ENCOUNTER — Other Ambulatory Visit: Payer: Self-pay | Admitting: Oncology

## 2014-06-24 ENCOUNTER — Telehealth: Payer: Self-pay | Admitting: Oncology

## 2014-06-24 ENCOUNTER — Encounter: Payer: Self-pay | Admitting: Oncology

## 2014-06-24 ENCOUNTER — Other Ambulatory Visit (HOSPITAL_BASED_OUTPATIENT_CLINIC_OR_DEPARTMENT_OTHER): Payer: BLUE CROSS/BLUE SHIELD

## 2014-06-24 ENCOUNTER — Ambulatory Visit (HOSPITAL_BASED_OUTPATIENT_CLINIC_OR_DEPARTMENT_OTHER): Payer: BLUE CROSS/BLUE SHIELD | Admitting: Oncology

## 2014-06-24 VITALS — BP 95/64 | HR 91 | Temp 98.4°F | Resp 18 | Ht 71.0 in | Wt >= 6400 oz

## 2014-06-24 DIAGNOSIS — C786 Secondary malignant neoplasm of retroperitoneum and peritoneum: Secondary | ICD-10-CM

## 2014-06-24 DIAGNOSIS — Z86718 Personal history of other venous thrombosis and embolism: Secondary | ICD-10-CM

## 2014-06-24 DIAGNOSIS — C541 Malignant neoplasm of endometrium: Secondary | ICD-10-CM

## 2014-06-24 DIAGNOSIS — D689 Coagulation defect, unspecified: Secondary | ICD-10-CM

## 2014-06-24 LAB — COMPREHENSIVE METABOLIC PANEL (CC13)
ALK PHOS: 71 U/L (ref 40–150)
ALT: 26 U/L (ref 0–55)
AST: 26 U/L (ref 5–34)
Albumin: 3.4 g/dL — ABNORMAL LOW (ref 3.5–5.0)
Anion Gap: 12 mEq/L — ABNORMAL HIGH (ref 3–11)
BUN: 11.4 mg/dL (ref 7.0–26.0)
CO2: 27 mEq/L (ref 22–29)
Calcium: 9.7 mg/dL (ref 8.4–10.4)
Chloride: 100 mEq/L (ref 98–109)
Creatinine: 0.8 mg/dL (ref 0.6–1.1)
EGFR: >90 mL/min/{1.73_m2} (ref >90)
Glucose: 130 mg/dl (ref 70–140)
POTASSIUM: 3.6 meq/L (ref 3.5–5.1)
SODIUM: 139 meq/L (ref 136–145)
Total Bilirubin: 0.5 mg/dL (ref 0.20–1.20)
Total Protein: 7 g/dL (ref 6.4–8.3)

## 2014-06-24 LAB — CBC WITH DIFFERENTIAL/PLATELET
BASO%: 0.2 % (ref 0.0–2.0)
Basophils Absolute: 0 10*3/uL (ref 0.0–0.1)
EOS%: 1.9 % (ref 0.0–7.0)
Eosinophils Absolute: 0.1 10*3/uL (ref 0.0–0.5)
HCT: 38 % (ref 34.8–46.6)
HEMOGLOBIN: 12.6 g/dL (ref 11.6–15.9)
LYMPH%: 21.8 % (ref 14.0–49.7)
MCH: 33.6 pg (ref 25.1–34.0)
MCHC: 33.2 g/dL (ref 31.5–36.0)
MCV: 101.3 fL — ABNORMAL HIGH (ref 79.5–101.0)
MONO#: 0.2 10*3/uL (ref 0.1–0.9)
MONO%: 3.7 % (ref 0.0–14.0)
NEUT#: 3.7 10*3/uL (ref 1.5–6.5)
NEUT%: 72.4 % (ref 38.4–76.8)
Platelets: 204 10*3/uL (ref 145–400)
RBC: 3.75 10*6/uL (ref 3.70–5.45)
RDW: 17.7 % — AB (ref 11.2–14.5)
WBC: 5.1 10*3/uL (ref 3.9–10.3)
lymph#: 1.1 10*3/uL (ref 0.9–3.3)

## 2014-06-24 NOTE — Progress Notes (Signed)
OFFICE PROGRESS NOTE   June 24, 2014   Physicians: (K.Khan), W.Brewster, Orpah Melter, [place].Tsuei  INTERVAL HISTORY:  Patient is seen, together with mother and aunt, in continuing attention to recurrent endometrial carcinoma, on oral etoposide since 04-27-2014. She is tolerating this well at 100 mg daily x 21 every 28 days, due to begin next cycle on 06-29-14. Plan is for PET shortly prior to seeing Dr Skeet Latch next on 09-02-14.  She continues arixtra for previous PAC associated DVT, and with active gyn cancer + extremely sedentary lifestyle.   Patient has had only occasional, minimal nausea with the etoposide. Overall her strength and activity have improved in last 2 months, to the point that she is now sitting up in living room daily and joins family for all meals. Bowels are normal and she has no abdominal or pelvic pain. She denies bleeding and has no swelling in LE now. TSH 05-27-14 was 3.004, this having been 0.442 in 02-2013 as last available in this EMR. Mother has had thryoid problems since childhood, also in all of mother's siblings except 1. Dr Olen Pel also manages mother's thyroid, will see Aneesa on 06-25-14  to follow this.  No central catheter since PICC out 02-2014 Flu vaccine done  ONCOLOGIC HISTORY Patient developed excessive uterine bleeding. Dilatation and curettage with hysteroscopy documented grade 1 endometrioid endometrial adenocarcinoma. Due to morbid obesity Mirena IUD was placed along with Aygestin. Patient had a dramatic directed weight loss with nutritional support she went from 523 pounds to 359 pounds in 10/2009. On 12/27/2009 she underwent a robotic-assisted laparoscopic hysterectomy, bilateral salpingo-oophorectomy, and mini laparotomy through the umbilical port with morcellation of uterus within about the delivery of the uterus. The final pathology revealed an endometrial adenocarcinoma grade 2 with invasion limited to 1 mm of the myometrium.  In 09/2012 when she developed  right upper quadrant pain, presumed cholelithiasis, with laparoscopic evaluation finding peritoneal carcinomatosis and ascites. Omental biopsies had metastatic adenocarcinoma consistent with recurrent endometrial carcinoma. She had 12 cycles taxol carboplatin from 11-04-12 thru 07-24-13 with neulasta day 2. CT AP after cycle 12 showed stable to minimally improved disease. With progressive neuropathy, she continued single agent carboplatin, cycle 9 on 02-11-14. CT AP showed basically stable disease compared with 07-2013, with small volume disease at liver capsule, omentum/peritoneum, adjacent to sigmoid colon and loculated fluid left abdomen.  She initially required multiple paracenteses for ascites, none needed since Aug 2014. Patient continued carboplatin thru 02-10-14. CT AP 03-10-14 had fairly stable disease; gyn oncology recommended continuing treatment with another regimen. Oral etoposide was chosen to avoid another central line for present, cycle 1 begun 04-27-14   Review of systems as above, also: No fever, no bladder symptoms. No increased SOB. Only occasional back discomfort, positional as it has been since compression fractures. Remainder of 10 point Review of Systems negative.  Objective:  Vital signs in last 24 hours:  BP 95/64 mmHg  Pulse 91  Temp(Src) 98.4 F (36.9 C) (Oral)  Resp 18  Ht 5\' 11"  (1.803 m)  Wt 449 lb 8 oz (203.892 kg)  BMI 62.72 kg/m2 Weight is up 5 lbs, morbidly obese. Alert, oriented and appropriate. In WC, respirations not labored RA. No alopecia  HEENT:PERRL, sclerae not icteric. Oral mucosa moist without lesions, posterior pharynx clear.  Neck supple. No JVD.  Lymphatics:no cervical,supraclavicular adenopathy Resp: clear to auscultation bilaterally and normal percussion bilaterally Cardio: regular rate and rhythm. No gallop. GI: abdomen obese, soft, nontender. Cannot appreciate any organomegaly etc due to obesity.  Normally active bowel  sounds Musculoskeletal/ Extremities: without pitting edema, cords, tenderness. Venous stasis discoloration lower legs bilaterally Neuro: no change peripheral neuropathy. Otherwise nonfocal Skin without rash, ecchymosis, petechiae No central catheter   Lab Results:  Results for orders placed or performed in visit on 06/24/14  CBC with Differential  Result Value Ref Range   WBC 5.1 3.9 - 10.3 10e3/uL   NEUT# 3.7 1.5 - 6.5 10e3/uL   HGB 12.6 11.6 - 15.9 g/dL   HCT 38.0 34.8 - 46.6 %   Platelets 204 145 - 400 10e3/uL   MCV 101.3 (H) 79.5 - 101.0 fL   MCH 33.6 25.1 - 34.0 pg   MCHC 33.2 31.5 - 36.0 g/dL   RBC 3.75 3.70 - 5.45 10e6/uL   RDW 17.7 (H) 11.2 - 14.5 %   lymph# 1.1 0.9 - 3.3 10e3/uL   MONO# 0.2 0.1 - 0.9 10e3/uL   Eosinophils Absolute 0.1 0.0 - 0.5 10e3/uL   Basophils Absolute 0.0 0.0 - 0.1 10e3/uL   NEUT% 72.4 38.4 - 76.8 %   LYMPH% 21.8 14.0 - 49.7 %   MONO% 3.7 0.0 - 14.0 %   EOS% 1.9 0.0 - 7.0 %   BASO% 0.2 0.0 - 2.0 %    CMET available after visit normal with exception of albumin 3.4  Studies/Results:  No results found.  Medications: I have reviewed the patient's current medications.  DISCUSSION: Discussed differences in PET vs CT. Will order PET when I see her next.    Assessment/Plan:  1.Endometrial carcinoma IA grade 2 at diagnosis March 2009, treated nonsurgically until weight loss of ~ 165 lbs allowed hysterectomy BSO 12-2009. 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 CT 02-2014 with essentially stable small volume disease. Cycle 1 oral etoposide begun 04-27-14. Treatment is in attempt to control disease; she and family understand that this will not be curative. She will begin next cycle 06-29-14 x 21 days and I will see her with labs in April. Plan PET early May after 3  cycles. 2.morbid obesity: weight again increasing, BMI 61. No marked ascites on recent CT. TSH upper limits normal, which is quite a bit higher than 02-2013. 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 7.GERD controlled on protonix and carafate 8. Cholelithiasis by CT 9.compression fractures T 10-31 Jan 2013 after fall, post kyphoplasties. Degenerative arthritis. Uses occasional percocet 10. flu vaccine done 11.no advance directives per EMR    Patient in agreement with continuing etoposide as planned. Time spent 25 min including >50% counseling and coordination of care.   Aubriegh Minch P, MD   06/24/2014, 1:34 PM

## 2014-06-30 ENCOUNTER — Other Ambulatory Visit: Payer: Self-pay | Admitting: *Deleted

## 2014-06-30 DIAGNOSIS — C786 Secondary malignant neoplasm of retroperitoneum and peritoneum: Secondary | ICD-10-CM

## 2014-06-30 MED ORDER — FERROUS SULFATE 325 (65 FE) MG PO TABS: 325.0000 mg | ORAL_TABLET | Freq: Three times a day (TID) | ORAL | Status: DC

## 2014-07-15 ENCOUNTER — Other Ambulatory Visit: Payer: Self-pay | Admitting: *Deleted

## 2014-07-15 MED ORDER — PROMETHAZINE HCL 25 MG PO TABS: 12.5000 mg | ORAL_TABLET | Freq: Four times a day (QID) | ORAL | Status: AC | PRN

## 2014-07-16 ENCOUNTER — Other Ambulatory Visit: Payer: Self-pay | Admitting: *Deleted

## 2014-07-16 DIAGNOSIS — C541 Malignant neoplasm of endometrium: Secondary | ICD-10-CM

## 2014-07-16 MED ORDER — PROCHLORPERAZINE MALEATE 10 MG PO TABS: 10.0000 mg | ORAL_TABLET | Freq: Four times a day (QID) | ORAL | Status: DC | PRN

## 2014-07-24 ENCOUNTER — Other Ambulatory Visit: Payer: Self-pay | Admitting: Oncology

## 2014-07-24 DIAGNOSIS — C541 Malignant neoplasm of endometrium: Secondary | ICD-10-CM

## 2014-07-26 ENCOUNTER — Other Ambulatory Visit (HOSPITAL_BASED_OUTPATIENT_CLINIC_OR_DEPARTMENT_OTHER): Payer: BLUE CROSS/BLUE SHIELD

## 2014-07-26 ENCOUNTER — Telehealth: Payer: Self-pay | Admitting: Oncology

## 2014-07-26 ENCOUNTER — Encounter: Payer: Self-pay | Admitting: Oncology

## 2014-07-26 ENCOUNTER — Ambulatory Visit (HOSPITAL_BASED_OUTPATIENT_CLINIC_OR_DEPARTMENT_OTHER): Payer: BLUE CROSS/BLUE SHIELD | Admitting: Oncology

## 2014-07-26 VITALS — BP 118/77 | HR 92 | Temp 97.7°F | Resp 18 | Ht 71.0 in | Wt >= 6400 oz

## 2014-07-26 DIAGNOSIS — C541 Malignant neoplasm of endometrium: Secondary | ICD-10-CM

## 2014-07-26 DIAGNOSIS — Z86718 Personal history of other venous thrombosis and embolism: Secondary | ICD-10-CM | POA: Diagnosis not present

## 2014-07-26 LAB — CBC WITH DIFFERENTIAL/PLATELET
BASO%: 0.3 % (ref 0.0–2.0)
BASOS ABS: 0 10*3/uL (ref 0.0–0.1)
EOS%: 1.5 % (ref 0.0–7.0)
Eosinophils Absolute: 0.1 10*3/uL (ref 0.0–0.5)
HCT: 39 % (ref 34.8–46.6)
HEMOGLOBIN: 12.8 g/dL (ref 11.6–15.9)
LYMPH%: 17 % (ref 14.0–49.7)
MCH: 34.3 pg — ABNORMAL HIGH (ref 25.1–34.0)
MCHC: 32.8 g/dL (ref 31.5–36.0)
MCV: 104.6 fL — AB (ref 79.5–101.0)
MONO#: 0.6 10*3/uL (ref 0.1–0.9)
MONO%: 10.3 % (ref 0.0–14.0)
NEUT#: 4.1 10*3/uL (ref 1.5–6.5)
NEUT%: 70.9 % (ref 38.4–76.8)
Platelets: 179 10*3/uL (ref 145–400)
RBC: 3.73 10*6/uL (ref 3.70–5.45)
RDW: 19.5 % — ABNORMAL HIGH (ref 11.2–14.5)
WBC: 5.8 10*3/uL (ref 3.9–10.3)
lymph#: 1 10*3/uL (ref 0.9–3.3)

## 2014-07-26 LAB — COMPREHENSIVE METABOLIC PANEL (CC13)
ALBUMIN: 3.5 g/dL (ref 3.5–5.0)
ALK PHOS: 75 U/L (ref 40–150)
ALT: 28 U/L (ref 0–55)
ANION GAP: 13 meq/L — AB (ref 3–11)
AST: 28 U/L (ref 5–34)
BILIRUBIN TOTAL: 0.67 mg/dL (ref 0.20–1.20)
BUN: 8.5 mg/dL (ref 7.0–26.0)
CO2: 29 mEq/L (ref 22–29)
Calcium: 9.6 mg/dL (ref 8.4–10.4)
Chloride: 99 mEq/L (ref 98–109)
Creatinine: 0.7 mg/dL (ref 0.6–1.1)
Glucose: 89 mg/dl (ref 70–140)
Potassium: 3.9 mEq/L (ref 3.5–5.1)
Sodium: 141 mEq/L (ref 136–145)
Total Protein: 7.2 g/dL (ref 6.4–8.3)

## 2014-07-26 NOTE — Progress Notes (Signed)
OFFICE PROGRESS NOTE   July 26, 2014   Physicians:(K.Khan), W.Brewster, Orpah Melter, M.Tsuei  INTERVAL HISTORY:   Patient is seen, together with mother and aunt, in continuing attention to treatment for endometrial cancer, on oral etoposide since 04-27-2014, due to begin next 21 day cycle on 07-27-14. She is to see Dr Skeet Latch 09-02-14, PET recommended prior to that visit.  Patient had occasional episodes of nausea and vomiting, moreso than previous cycles and mostly during the last week of most recent cycle. The symptoms occurred suddenly, such that she did not have time to use antiemetics. She has had fatigue at times, including yesterday, but feels fairly well today.   She continues arixtra injections, with no bleeding. Anticoagulation was begun with PAC associated DVT, PAC now out however with morbid obesity and minimal physical activity (walks to table, sits in chair) it has seemed best to continue this.   No central catheter since PICC out 02-2014 Flu vaccine done  ONCOLOGIC HISTORY Patient developed excessive uterine bleeding. Dilatation and curettage with hysteroscopy documented grade 1 endometrioid endometrial adenocarcinoma. Due to morbid obesity Mirena IUD was placed along with Aygestin. Patient had a dramatic directed weight loss with nutritional support she went from 523 pounds to 359 pounds in 10/2009. On 12/27/2009 she underwent a robotic-assisted laparoscopic hysterectomy, bilateral salpingo-oophorectomy, and mini laparotomy through the umbilical port with morcellation of uterus within about the delivery of the uterus. The final pathology revealed an endometrial adenocarcinoma grade 2 with invasion limited to 1 mm of the myometrium.  In 09/2012 when she developed right upper quadrant pain, presumed cholelithiasis, with laparoscopic evaluation finding peritoneal carcinomatosis and ascites. Omental biopsies had metastatic adenocarcinoma consistent with recurrent endometrial  carcinoma. She had 12 cycles taxol carboplatin from 11-04-12 thru 07-24-13 with neulasta day 2. CT AP after cycle 12 showed stable to minimally improved disease. With progressive neuropathy, she continued single agent carboplatin, cycle 9 on 02-11-14. CT AP showed basically stable disease compared with 07-2013, with small volume disease at liver capsule, omentum/peritoneum, adjacent to sigmoid colon and loculated fluid left abdomen.  She initially required multiple paracenteses for ascites, none needed since Aug 2014. Patient continued carboplatin thru 02-10-14. CT AP 03-10-14 had fairly stable disease; gyn oncology recommended continuing treatment with another regimen. Oral etoposide was chosen to avoid another central line for present, cycle 1 begun 04-27-14   Review of systems as above, also: No increased SOB. No new or different pain, has needed muscle relaxant x1 and pain meds x3 in past month. Bladder ok. No increased swelling LE.  Remainder of 10 point Review of Systems negative.  Objective:  Vital signs in last 24 hours:  BP 118/77 mmHg  Pulse 92  Temp(Src) 97.7 F (36.5 C) (Oral)  Resp 18  Ht 5\' 11"  (1.803 m)  Wt 459 lb 6.4 oz (208.382 kg)  BMI 64.10 kg/m2  SpO2 98% Weight up 10 lbs in past month Alert, oriented and appropriate. In WC, respirations not labored RA. Looks comfortable, family very supportive No alopecia  HEENT:PERRL, sclerae not icteric. Oral mucosa moist without lesions.  Neck supple. No JVD.  Lymphatics:no cervical,supraclavicular adenopathy Resp: clear to auscultation bilaterally  Cardio: regular rate and rhythm. No gallop. GI: abdomen obese, soft, nontender, cannot appreciate mass or organomegaly. Normally active bowel sounds.  Musculoskeletal/ Extremities: chronic venous stasis changes without pitting edema, cords, tenderness Neuro: no peripheral neuropathy. Otherwise nonfocal. PSYCH appropriate mood and affect Skin without rash, ecchymosis, petechiae   Lab  Results:  Results for orders  placed or performed in visit on 07/26/14  CBC with Differential  Result Value Ref Range   WBC 5.8 3.9 - 10.3 10e3/uL   NEUT# 4.1 1.5 - 6.5 10e3/uL   HGB 12.8 11.6 - 15.9 g/dL   HCT 39.0 34.8 - 46.6 %   Platelets 179 145 - 400 10e3/uL   MCV 104.6 (H) 79.5 - 101.0 fL   MCH 34.3 (H) 25.1 - 34.0 pg   MCHC 32.8 31.5 - 36.0 g/dL   RBC 3.73 3.70 - 5.45 10e6/uL   RDW 19.5 (H) 11.2 - 14.5 %   lymph# 1.0 0.9 - 3.3 10e3/uL   MONO# 0.6 0.1 - 0.9 10e3/uL   Eosinophils Absolute 0.1 0.0 - 0.5 10e3/uL   Basophils Absolute 0.0 0.0 - 0.1 10e3/uL   NEUT% 70.9 38.4 - 76.8 %   LYMPH% 17.0 14.0 - 49.7 %   MONO% 10.3 0.0 - 14.0 %   EOS% 1.5 0.0 - 7.0 %   BASO% 0.3 0.0 - 2.0 %    CMET resulted after visit normal.  Studies/Results:  No results found. PET requested shortly prior to Dr Leone Brand visit on 09-02-14  Medications: I have reviewed the patient's current medications. She will begin next cycle of oral etoposide on 07-28-14. She will let me know if increased nausea/ vomiting, in which case we may shorten this cycle.  DISCUSSION: Will hold further etoposide after upcoming cycle until after PET/ visit back to Dr Skeet Latch. Note patient has some claustrophobia, but hopefully will tolerate PET. Patient and family understand rationale for continuing arixtra and are in favor of continuing this.  Assessment/Plan:  1.Endometrial carcinoma IA grade 2 at diagnosis March 2009, treated nonsurgically until weight loss of ~ 165 lbs allowed hysterectomy BSO 12-2009. 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 CT 02-2014 with essentially stable small volume disease. Cycle 1 oral etoposide begun 04-27-14. Treatment is in attempt to control disease; she and family understand that this will not be curative. She  will begin next cycle 07-27-14 x 21 days as long as tolerated, then PET early May prior to re-evaluation by Dr Skeet Latch. 2.morbid obesity: weight again increasing, BMI 64. No marked ascites on recent CT. 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 7.GERD controlled on protonix and carafate 8. Cholelithiasis by CT 9.compression fractures T 10-31 Jan 2013 after fall, post kyphoplasties. Degenerative arthritis. Uses occasional percocet 10. flu vaccine done 11.no advance directives per EMR   All questions answered. Time spent 25 min including >50% counseling and coordination of care.   LIVESAY,LENNIS P, MD   07/26/2014, 1:37 PM

## 2014-07-26 NOTE — Telephone Encounter (Signed)
per pof to sch pt appt-gave pt copy of sch °

## 2014-07-27 ENCOUNTER — Other Ambulatory Visit: Payer: Self-pay | Admitting: Oncology

## 2014-07-27 ENCOUNTER — Telehealth: Payer: Self-pay | Admitting: *Deleted

## 2014-07-27 ENCOUNTER — Telehealth: Payer: Self-pay | Admitting: Oncology

## 2014-07-27 DIAGNOSIS — C541 Malignant neoplasm of endometrium: Secondary | ICD-10-CM

## 2014-07-27 MED ORDER — DIAZEPAM 2 MG PO TABS: ORAL_TABLET | ORAL | Status: DC

## 2014-07-27 NOTE — Addendum Note (Signed)
Addended by: Baruch Merl on: 07/27/2014 05:00 PM   Modules accepted: Orders

## 2014-07-27 NOTE — Telephone Encounter (Signed)
I spoke with PET department at [facility]: head is briefly enclosed, sometimes patients do better with medication to relax. Fine to give her prescription for either xanax 0.25 mg or valium 2 mg to take 30 min prior if she would like. Not another option for PET as far as I know.

## 2014-07-27 NOTE — Telephone Encounter (Signed)
PT. IS CLAUSTROPHOBIC AND WILL NEED TO BE SCHEDULED AT ANOTHER FACILITY. THIS NOTE ROUTED TO DR.LIVESAY AND LOUISE ARCHAMBALULT,RN.

## 2014-07-27 NOTE — Telephone Encounter (Signed)
Spoke with Ms Giebler mom and reviewed the information noted below by Dr. Marko Plume.  Mrs. Sorber stated that Randa has had valium in the past for an MRI and it was effective.  Sent a prescription to [facility] for Valium as noted below by Dr. Marko Plume.

## 2014-07-27 NOTE — Telephone Encounter (Signed)
recvd call from [facility] in re pt PET scan-stated pt could not have @ [facility] due to claustrophobic-adv [facility] must write new order to different location in order for them ot sch

## 2014-07-27 NOTE — Progress Notes (Signed)
This encounter was created in error - please disregard.

## 2014-08-04 ENCOUNTER — Other Ambulatory Visit: Payer: Self-pay | Admitting: *Deleted

## 2014-08-04 DIAGNOSIS — E876 Hypokalemia: Secondary | ICD-10-CM

## 2014-08-04 MED ORDER — POTASSIUM CHLORIDE ER 10 MEQ PO TBCR: 10.0000 meq | EXTENDED_RELEASE_TABLET | Freq: Every day | ORAL | Status: DC

## 2014-08-04 NOTE — Telephone Encounter (Signed)
Per Dr. Marko Plume - patient only needs to be taking 38mEQ potassium daily. Prescription sent into patient's pharmacy with new instructions. Called and spoke with Tamela Oddi, patient's mother, and let her know this. She is agreeable to pick up prescription and to the new instructions.

## 2014-08-11 ENCOUNTER — Telehealth: Payer: Self-pay

## 2014-08-11 NOTE — Telephone Encounter (Signed)
Mother called stating pt got jury summons letter for September 26, 2014. Explained to bring the letter in and we can generate a letter to excuse her from jury duty.

## 2014-08-30 ENCOUNTER — Other Ambulatory Visit (HOSPITAL_BASED_OUTPATIENT_CLINIC_OR_DEPARTMENT_OTHER): Payer: BLUE CROSS/BLUE SHIELD

## 2014-08-30 ENCOUNTER — Telehealth: Payer: Self-pay

## 2014-08-30 ENCOUNTER — Other Ambulatory Visit: Payer: Self-pay

## 2014-08-30 ENCOUNTER — Ambulatory Visit (HOSPITAL_COMMUNITY)
Admission: RE | Admit: 2014-08-30 | Discharge: 2014-08-30 | Disposition: A | Discharge status: Home / Self Care | Payer: BLUE CROSS/BLUE SHIELD | Source: Ambulatory Visit | Attending: Oncology | Admitting: Oncology

## 2014-08-30 DIAGNOSIS — G629 Polyneuropathy, unspecified: Secondary | ICD-10-CM

## 2014-08-30 DIAGNOSIS — C541 Malignant neoplasm of endometrium: Secondary | ICD-10-CM | POA: Insufficient documentation

## 2014-08-30 LAB — CBC WITH DIFFERENTIAL/PLATELET
BASO%: 0.2 % (ref 0.0–2.0)
Basophils Absolute: 0 10*3/uL (ref 0.0–0.1)
EOS%: 2.3 % (ref 0.0–7.0)
Eosinophils Absolute: 0.2 10*3/uL (ref 0.0–0.5)
HEMATOCRIT: 38.5 % (ref 34.8–46.6)
HGB: 12.5 g/dL (ref 11.6–15.9)
LYMPH%: 16.9 % (ref 14.0–49.7)
MCH: 34.7 pg — AB (ref 25.1–34.0)
MCHC: 32.5 g/dL (ref 31.5–36.0)
MCV: 106.9 fL — AB (ref 79.5–101.0)
MONO#: 0.8 10*3/uL (ref 0.1–0.9)
MONO%: 11.9 % (ref 0.0–14.0)
NEUT#: 4.5 10*3/uL (ref 1.5–6.5)
NEUT%: 68.7 % (ref 38.4–76.8)
PLATELETS: 186 10*3/uL (ref 145–400)
RBC: 3.6 10*6/uL — ABNORMAL LOW (ref 3.70–5.45)
RDW: 18.3 % — ABNORMAL HIGH (ref 11.2–14.5)
WBC: 6.6 10*3/uL (ref 3.9–10.3)
lymph#: 1.1 10*3/uL (ref 0.9–3.3)

## 2014-08-30 LAB — GLUCOSE, CAPILLARY: GLUCOSE-CAPILLARY: 105 mg/dL — AB (ref 70–99)

## 2014-08-30 LAB — COMPREHENSIVE METABOLIC PANEL (CC13)
ALT: 26 U/L (ref 0–55)
ANION GAP: 13 meq/L — AB (ref 3–11)
AST: 27 U/L (ref 5–34)
Albumin: 3.1 g/dL — ABNORMAL LOW (ref 3.5–5.0)
Alkaline Phosphatase: 66 U/L (ref 40–150)
BUN: 9 mg/dL (ref 7.0–26.0)
CALCIUM: 9.2 mg/dL (ref 8.4–10.4)
CO2: 28 meq/L (ref 22–29)
CREATININE: 0.8 mg/dL (ref 0.6–1.1)
Chloride: 102 mEq/L (ref 98–109)
GLUCOSE: 114 mg/dL (ref 70–140)
Potassium: 4.1 mEq/L (ref 3.5–5.1)
Sodium: 142 mEq/L (ref 136–145)
Total Bilirubin: 0.5 mg/dL (ref 0.20–1.20)
Total Protein: 6.4 g/dL (ref 6.4–8.3)

## 2014-08-30 MED ORDER — GABAPENTIN 100 MG PO CAPS: 200.0000 mg | ORAL_CAPSULE | Freq: Three times a day (TID) | ORAL | Status: AC

## 2014-08-30 MED ORDER — PROCHLORPERAZINE MALEATE 10 MG PO TABS: 10.0000 mg | ORAL_TABLET | Freq: Four times a day (QID) | ORAL | Status: DC | PRN

## 2014-08-30 MED ORDER — FLUDEOXYGLUCOSE F - 18 (FDG) INJECTION
16.0000 | Freq: Once | INTRAVENOUS | Status: AC | PRN
  Administered 2014-08-30: 16 via INTRAVENOUS

## 2014-08-30 NOTE — Telephone Encounter (Signed)
Mother pick up note for [name] to be excused from [place] duty now and at any time due to recurrent cancer and comorbid conditions that require assistance with all ADLs.

## 2014-08-31 LAB — CA 125: CA 125: 48 U/mL — ABNORMAL HIGH (ref <35)

## 2014-09-02 ENCOUNTER — Encounter: Payer: Self-pay | Admitting: Gynecologic Oncology

## 2014-09-02 ENCOUNTER — Ambulatory Visit: Payer: BLUE CROSS/BLUE SHIELD | Attending: Gynecologic Oncology | Admitting: Gynecologic Oncology

## 2014-09-02 VITALS — BP 111/77 | HR 93 | Temp 97.7°F | Resp 18 | Ht 71.0 in | Wt >= 6400 oz

## 2014-09-02 DIAGNOSIS — Z170 Estrogen receptor positive status [ER+]: Secondary | ICD-10-CM

## 2014-09-02 DIAGNOSIS — C541 Malignant neoplasm of endometrium: Secondary | ICD-10-CM | POA: Diagnosis not present

## 2014-09-02 DIAGNOSIS — C786 Secondary malignant neoplasm of retroperitoneum and peritoneum: Secondary | ICD-10-CM

## 2014-09-02 DIAGNOSIS — Z9221 Personal history of antineoplastic chemotherapy: Secondary | ICD-10-CM

## 2014-09-02 NOTE — Addendum Note (Signed)
Addended by: Alla Feeling on: 09/02/2014 05:00 PM   Modules accepted: Orders

## 2014-09-02 NOTE — Progress Notes (Signed)
Office Visit:  GYN ONCOLOGY   MARIEN MANSHIP 49 y.o. female  CC: Endometrial cancer.  Persistent disease.   Assessment/Plan: Ms. Stirewalt is a 49 y.o. with Stage IA, grade 2 endometrial adenocarcinoma initially diagnosed in March of 2009. Robotic hysterectomy bilateral salpingo-oophorectomy with minilaparotomy for removal of the specimen was performed in September 2011. She presented with evidence of peritoneal carcinomatosis and pathology consistent with glandular adenocarcinoma in May of 2014 presumptive endometrial cancer recurrence.  Ms Swinson is  s/p 12 cycles Taxol and platinum therapy followed by an additional   11 cycles single agent carboplatin.   I wonder about the adequacy of her dosing given her morbid obesity.  The patient's volume of distribution is much larger than the normal patient because of her weight.  The taxol/carbo formulas have upper limits of administration. and there pharmacological limitations against higher doses.  The current single agent carboplatin  regimen resulted in stabilization of the ascites.  However there was a platin allergic reaction.   DELAINEE TRAMEL  has since received oral etoposide.  PET last week with evidence of progression.  Tumor is ER mod positive PR neg Options proposed are 1.   Doxil/ avastin 2..aromatase inhibitor 3. Single agent Gemzar 4 Single agent avastin   NAKARI BRACKNELL opted for Doxil will leave the decision for addition of avastin to Dr. Marko Plume Will likely need a port and MUGA scan Follow-up in 3 months  HPI:  Dayana Dalporto is a 49 y.o.  diagnosed with a DVT in March of 2009 with Coumadin initiated at that time.  Excessive uterine bleeding developed leading to a dilatation and curettage with hysteroscopy.  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  Due to her morbid obesity, a Mirena IUD was placed along with the addition of Aygestin.  After dramatic, directed weight loss and nutritional support, she went  from 523 lbs on her initial visit to 359 lbs in July of 2011.  Her BMI was reduced to 50.  On December 27, 2009, she underwent a robotic-assisted laparoscopic hysterectomy, bilateral salpingo-oophorectomy, and mini-laparotomy through the umbilical port with morcellation of the uterus within a bag for delivery of the uterus. 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 Omental biopsies were obtained. Pathology was consistent with metastatic adenocarcinoma.  In 09/2012 when she developed right upper quadrant pain, presumed cholelithiasis, with laparoscopic evaluation finding peritoneal carcinomatosis and ascites. Omental biopsies had metastatic adenocarcinoma consistent with recurrent endometrial carcinoma. She had 12 cycles taxol carboplatin from 11-04-12 thru 07-24-13 with neulasta day 2. CT AP after cycle 12 showed stable to minimally improved disease. With progressive neuropathy, she has continued single agent carboplatin, cycle 9 on 02-11-14.  She initially required multiple paracenteses for ascites, none needed since Aug 2014.   Carboplatin allergic rx 01/2014   Subsequently treated with oral etoposide from 03/2014 until 08/2014.  PET 08/2014 reviewed. IMPRESSION: Markedly limited study secondary to image noise related to patient body habitus. Despite this limitation, there are clear juxta diaphragmatic and omental/ mesenteric hypermetabolic metastatic deposits. Comparing to the CT scan from 03/10/2014, the disease appears overall progressed  Original tumor  is ER mod positive PR neg  Doing well.  Without complaints  Past Surgical Hx:  Past Surgical History  Procedure Laterality Date  . Ankle surgery  1990  . Wisdom tooth  extraction    . Tonsillectomy and adenoidectomy    . Abdominal hysterectomy  12/2009    [facility], [facility]  . Laparoscopy N/A 10/02/2012    Procedure: LAPAROSCOPY DIAGNOSTIC, perocentisis, omental biopsy;  Surgeon: Imogene Burn. Tsuei, MD;  Location: [facility];  Service: General;  Laterality: N/A;  removed a total of 15,000 of acities  . Paracentesis    . Kyphoplasty  02/26/2013   Past Medical Hx:  Past Medical History  Diagnosis Date  . Obesity   . DVT (deep venous thrombosis) 2009    RLE, tx for ~6 months  . PONV (postoperative nausea and vomiting)   . Gallstones   . Ascites   . Uterine cancer 2010    Dr. Humphrey Rolls, chemotherapy  . Family history of anesthesia complication     MOTHER IS DIFFICULT TO WAKE  . Gallstones   . Cellulitis of right lower extremity 03/13/2013  . Iron deficiency anemia 03/12/2013  . Compression fracture of L2 04/14/2013   Family Hx:  Family History  Problem Relation Age of Onset  . Heart disease Father   . Diabetes Brother   . Heart disease Mother   . Cirrhosis Father   . Diabetes Father   . Thyroid disease Mother   . Diabetes Mother   . Kidney disease Brother     from diabetes  . Breast cancer Maternal Aunt   . Breast cancer Maternal Aunt   . Melanoma Maternal Aunt   . Melanoma Maternal Aunt   . Other Maternal Aunt     lupus anticardiolipin ab syndrome    SOCIAL HISTORY:    Lives with her parents and brother.  Has excellent social support.    REVIEW OF SYSTEMS Constitutional feels well.  Cardiovascular  No chest pain, shortness of breath, or edema  Pulmonary  No cough or wheeze.  Gastro Intestinal  No nausea, vomitting, or diarrhoea. No bright red blood per rectum, No change in bowel movement, or constipation. No bloating. No sensation of ascites Genito Urinary   Denies vaginal bleeding Musculo Skeletal  No myalgia, arthralgia, joint swelling.   Neurologic  Lower extremity weakness,  limited  gait,   Vitals: BP 111/77 mmHg  Pulse 93  Temp(Src)  97.7 F (36.5 C) (Oral)  Resp 18  Ht 5\' 11"  (1.803 m)  Wt 456 lb 6.4 oz (207.022 kg)  BMI 63.68 kg/m2  SpO2 97%  Physical Exam:  General:  Well developed, morbidly obese in NAD. Patient in good spirits, appropriate mood and affect. Chest:  CTA Cardiac RRR. Abdomen: Obese soft, Faint BS.  No palpable masses, no ascites Extremities: No CCE.  bilateral skin discoloration of the lower extremities Pelvic:  Patient declined Back:  No CVAT

## 2014-09-02 NOTE — Patient Instructions (Signed)
   Thank you very much Ms. BURMA KETCHER for allowing me to provide care for you today.  I appreciate your confidence in choosing our Gynecologic Oncology team.  If you have any questions about your visit today please call our office and we will get back to you as soon as possible.  Please consider using the website Medlineplus.gov as an Geneticist, molecular.   Francetta Found. Marillyn Goren MD., PhD Gynecologic Oncology

## 2014-09-03 ENCOUNTER — Telehealth: Payer: Self-pay | Admitting: *Deleted

## 2014-09-03 NOTE — Telephone Encounter (Signed)
Called and spoke with patient's mother to give her appt for [name] to see Dr. Skeet Latch on 12/02/14 at 10:45am. Confirmed that they received call about MUGA scan on 09/10/14 while on the phone. Patient's mother states someone called earlier today with the appt. No other questions or concerns voiced at this time.

## 2014-09-07 ENCOUNTER — Other Ambulatory Visit: Payer: Self-pay | Admitting: Oncology

## 2014-09-07 DIAGNOSIS — C541 Malignant neoplasm of endometrium: Secondary | ICD-10-CM

## 2014-09-09 ENCOUNTER — Telehealth: Payer: Self-pay | Admitting: Oncology

## 2014-09-09 ENCOUNTER — Ambulatory Visit (HOSPITAL_BASED_OUTPATIENT_CLINIC_OR_DEPARTMENT_OTHER): Payer: BLUE CROSS/BLUE SHIELD

## 2014-09-09 ENCOUNTER — Encounter: Payer: Self-pay | Admitting: Oncology

## 2014-09-09 ENCOUNTER — Ambulatory Visit (HOSPITAL_BASED_OUTPATIENT_CLINIC_OR_DEPARTMENT_OTHER): Payer: BLUE CROSS/BLUE SHIELD | Admitting: Oncology

## 2014-09-09 ENCOUNTER — Telehealth: Payer: Self-pay | Admitting: *Deleted

## 2014-09-09 VITALS — BP 108/75 | HR 86 | Temp 98.3°F | Resp 16 | Ht 73.0 in | Wt >= 6400 oz

## 2014-09-09 DIAGNOSIS — K219 Gastro-esophageal reflux disease without esophagitis: Secondary | ICD-10-CM

## 2014-09-09 DIAGNOSIS — C541 Malignant neoplasm of endometrium: Secondary | ICD-10-CM | POA: Diagnosis not present

## 2014-09-09 DIAGNOSIS — G620 Drug-induced polyneuropathy: Secondary | ICD-10-CM

## 2014-09-09 DIAGNOSIS — Z7901 Long term (current) use of anticoagulants: Secondary | ICD-10-CM

## 2014-09-09 DIAGNOSIS — C786 Secondary malignant neoplasm of retroperitoneum and peritoneum: Secondary | ICD-10-CM

## 2014-09-09 DIAGNOSIS — T451X5A Adverse effect of antineoplastic and immunosuppressive drugs, initial encounter: Secondary | ICD-10-CM

## 2014-09-09 DIAGNOSIS — I82409 Acute embolism and thrombosis of unspecified deep veins of unspecified lower extremity: Secondary | ICD-10-CM | POA: Diagnosis not present

## 2014-09-09 LAB — COMPREHENSIVE METABOLIC PANEL (CC13)
ALT: 20 U/L (ref 0–55)
ANION GAP: 13 meq/L — AB (ref 3–11)
AST: 24 U/L (ref 5–34)
Albumin: 2.9 g/dL — ABNORMAL LOW (ref 3.5–5.0)
Alkaline Phosphatase: 63 U/L (ref 40–150)
BILIRUBIN TOTAL: 0.5 mg/dL (ref 0.20–1.20)
BUN: 12.1 mg/dL (ref 7.0–26.0)
CALCIUM: 8.7 mg/dL (ref 8.4–10.4)
CHLORIDE: 100 meq/L (ref 98–109)
CO2: 27 meq/L (ref 22–29)
Creatinine: 0.8 mg/dL (ref 0.6–1.1)
EGFR: 89 mL/min/{1.73_m2} — ABNORMAL LOW (ref >90)
GLUCOSE: 117 mg/dL (ref 70–140)
Potassium: 3.8 mEq/L (ref 3.5–5.1)
SODIUM: 141 meq/L (ref 136–145)
TOTAL PROTEIN: 6.4 g/dL (ref 6.4–8.3)

## 2014-09-09 LAB — CBC WITH DIFFERENTIAL/PLATELET
BASO%: 0.2 % (ref 0.0–2.0)
Basophils Absolute: 0 10*3/uL (ref 0.0–0.1)
EOS%: 1.7 % (ref 0.0–7.0)
Eosinophils Absolute: 0.1 10*3/uL (ref 0.0–0.5)
HCT: 40.1 % (ref 34.8–46.6)
HGB: 12.8 g/dL (ref 11.6–15.9)
LYMPH%: 14.8 % (ref 14.0–49.7)
MCH: 33.7 pg (ref 25.1–34.0)
MCHC: 31.9 g/dL (ref 31.5–36.0)
MCV: 105.5 fL — AB (ref 79.5–101.0)
MONO#: 0.6 10*3/uL (ref 0.1–0.9)
MONO%: 7.2 % (ref 0.0–14.0)
NEUT#: 6.1 10*3/uL (ref 1.5–6.5)
NEUT%: 76.1 % (ref 38.4–76.8)
PLATELETS: 183 10*3/uL (ref 145–400)
RBC: 3.8 10*6/uL (ref 3.70–5.45)
RDW: 16.9 % — AB (ref 11.2–14.5)
WBC: 8.1 10*3/uL (ref 3.9–10.3)
lymph#: 1.2 10*3/uL (ref 0.9–3.3)

## 2014-09-09 MED ORDER — LIDOCAINE-PRILOCAINE 2.5-2.5 % EX CREA: 1.0000 "application " | TOPICAL_CREAM | CUTANEOUS | Status: AC | PRN

## 2014-09-09 NOTE — Progress Notes (Signed)
OFFICE PROGRESS NOTE   Sep 09, 2014   Physicians::(K.Khan), W.Brewster, Orpah Melter, M.Tsuei  INTERVAL HISTORY:   Patient is seen, together with mother and aunt, now with progressive endometrial cancer after 4 cycles oral etoposide, that documented by PET 08-30-14. She saw Dr Skeet Latch on 09-02-14; patient does want to continue treatment in attempt to control disease, and Dr Skeet Latch is in agreement with this. Patient has been aware of upper abdominal fullness for past several days, which may be from some ascites in that area, but otherwise is not obviously symptomatic from progression.  As Doxil is consideration, gyn oncology has scheduled MUGA for 09-10-14.  Patient is in agreement with PAC by IR; she had DVT with right sided PAC 03-2013, subsequently had PICC for almost a year. She is on LMW heparin, which can be held around [facility] placement and hopefully will lessen chance of clot with PAC now. She had teaching for doxil.  Patient has no new or different complaints otherwise. She is minimally ambulatory at home and sits up in chair as only activity. She requires assistance with bathing, toileting, ADLs due to morbid obesity; mother and other family are extremely diligent. She has no open skin lesions now, tho she has had problems in past beneath breasts and upper thighs/ lower abdomen beneath panus. She denies increased SOB. Appetite is good. Bowels are moving normally. She is voiding normal amounts. She has had no bleeding and no LE swelling. They use antifungal powder beneath breasts regularly.    No central catheter since PICC out 02-2014: PAC requested by IR Flu vaccine done  ONCOLOGIC HISTORY Patient developed excessive uterine bleeding. Dilatation and curettage with hysteroscopy documented grade 1 endometrioid endometrial adenocarcinoma. Due to morbid obesity Mirena IUD was placed along with Aygestin. Patient had a dramatic directed weight loss with nutritional support she went from 523  pounds to 359 pounds in 10/2009. On 12/27/2009 she underwent a robotic-assisted laparoscopic hysterectomy, bilateral salpingo-oophorectomy, and mini laparotomy through the umbilical port with morcellation of uterus within about the delivery of the uterus. The final pathology revealed an endometrial adenocarcinoma grade 2 with invasion limited to 1 mm of the myometrium.  In 09/2012 when she developed right upper quadrant pain, presumed cholelithiasis, with laparoscopic evaluation finding peritoneal carcinomatosis and ascites. Omental biopsies had metastatic adenocarcinoma consistent with recurrent endometrial carcinoma. She had 12 cycles taxol carboplatin from 11-04-12 thru 07-24-13 with neulasta day 2. CT AP after cycle 12 showed stable to minimally improved disease. With progressive neuropathy, she continued single agent carboplatin, cycle 9 on 02-11-14. CT AP showed basically stable disease compared with 07-2013, with small volume disease at liver capsule, omentum/peritoneum, adjacent to sigmoid colon and loculated fluid left abdomen.  She initially required multiple paracenteses for ascites, none needed since Aug 2014. Patient continued carboplatin thru 02-10-14. CT AP 03-10-14 had fairly stable disease; gyn oncology recommended continuing treatment with another regimen. Oral etoposide was chosen to avoid another central line for present, cycle 1 begun 04-27-14, x 4 cycles, completed 08-17-14. PET 08-30-14 had scattered hypermetabolic nodules in omentum and soft tissue, progressed compared with 02-2014.      Review of systems as above, also: No fever or symptoms of infection. They are surprised that she has gained 30 lbs by these scales.  Remainder of 10 point Review of Systems negative.  Objective:  Vital signs in last 24 hours:  BP 108/75 mmHg  Pulse 86  Temp(Src) 98.3 F (36.8 C) (Oral)  Resp 16  Ht 6' 1"  (1.854  m)  Wt 484 lb 1.6 oz (219.586 kg)  BMI 63.88 kg/m2  SpO2 96% Weight was 456 on  09-02-14 and 459 on 07-26-14 Alert, oriented and appropriate. Looks comfortable in WC, respirations not labored RA. No alopecia  HEENT:PERRL, sclerae not icteric. Oral mucosa moist without lesions, posterior pharynx clear.  Neck supple. No JVD.  Lymphatics:no cervical,supraclavicular  adenopathy Resp: clear to auscultation bilaterally and normal percussion bilaterally. Diminished BS in bases consistent with habitus Cardio: regular rate and rhythm. No gallop. GI: abdomen obese, soft, nontender including epigastrium, may be more full in epigastric area, cannot appreciate mass or organomegaly. Normally active bowel sounds.  Musculoskeletal/ Extremities: without pitting edema, cords, tenderness. Chronic venous stasis changes lower legs bilaterally Psych appropriate mood and affect Skin without rash, ecchymosis, petechiae    Lab Results:  Results for orders placed or performed in visit on 09/09/14  CBC with Differential  Result Value Ref Range   WBC 8.1 3.9 - 10.3 10e3/uL   NEUT# 6.1 1.5 - 6.5 10e3/uL   HGB 12.8 11.6 - 15.9 g/dL   HCT 40.1 34.8 - 46.6 %   Platelets 183 145 - 400 10e3/uL   MCV 105.5 (H) 79.5 - 101.0 fL   MCH 33.7 25.1 - 34.0 pg   MCHC 31.9 31.5 - 36.0 g/dL   RBC 3.80 3.70 - 5.45 10e6/uL   RDW 16.9 (H) 11.2 - 14.5 %   lymph# 1.2 0.9 - 3.3 10e3/uL   MONO# 0.6 0.1 - 0.9 10e3/uL   Eosinophils Absolute 0.1 0.0 - 0.5 10e3/uL   Basophils Absolute 0.0 0.0 - 0.1 10e3/uL   NEUT% 76.1 38.4 - 76.8 %   LYMPH% 14.8 14.0 - 49.7 %   MONO% 7.2 0.0 - 14.0 %   EOS% 1.7 0.0 - 7.0 %   BASO% 0.2 0.0 - 2.0 %  Comprehensive metabolic panel (Cmet) - [facility]  Result Value Ref Range   Sodium 141 136 - 145 mEq/L   Potassium 3.8 3.5 - 5.1 mEq/L   Chloride 100 98 - 109 mEq/L   CO2 27 22 - 29 mEq/L   Glucose 117 70 - 140 mg/dl   BUN 12.1 7.0 - 26.0 mg/dL   Creatinine 0.8 0.6 - 1.1 mg/dL   Total Bilirubin 0.50 0.20 - 1.20 mg/dL   Alkaline Phosphatase 63 40 - 150 U/L   AST 24 5 - 34 U/L   ALT  20 0 - 55 U/L   Total Protein 6.4 6.4 - 8.3 g/dL   Albumin 2.9 (L) 3.5 - 5.0 g/dL   Calcium 8.7 8.4 - 10.4 mg/dL   Anion Gap 13 (H) 3 - 11 mEq/L   EGFR 89 (L) >90 ml/min/1.73 m2    CA 125 on 08-30-14 was 48, up from 6.6 in 02-2014 Studies/Results:  NUCLEAR MEDICINE PET SKULL BASE TO THIGH  TECHNIQUE: Sixteen mCi F-18 FDG was injected intravenously. Full-ring PET imaging was performed from the skull base to thigh after the radiotracer. CT data was obtained and used for attenuation correction and anatomic localization.  FASTING BLOOD GLUCOSE: Value: 105 mg/dl  COMPARISON: Abdomen and pelvis CT from 03/10/2014.  FINDINGS: Note: Study is markedly degraded by image noise related to body habitus.  NECK  No hypermetabolic lymph nodes in the neck.  CHEST  2.3 cm soft tissue nodule in the left juxta cardiac/ juxta diaphragmatic fat is markedly hypermetabolic with SUV max = 09.3  ABDOMEN/PELVIS  Liver parenchyma is heterogeneous. Mottled FDG uptake is seen in the liver which could  mask small metastases. There is a large loculated fluid collection associated with the spleen, is seen previously. Hypermetabolic nodule in the right omentum (image 120 series 4) is hypermetabolic with SUV max = multiple omental/anterior mesenteric soft tissue nodules are identified. A prominent 2.3 cm nodule in the central anterior abdomen, adjacent to the loculated fluid collection, has SUV max = 14.5. There is hypermetabolic uptake in what is presumably un umbilical hernia, but body habitus and hand is distorts anatomy. This is visible on image 166 of series 4 with SUV max = 20.7.  SKELETON  There is mottled uptake within the marrow space which is nonspecific, but marrow involvement by neoplasm cannot be excluded.  IMPRESSION: Markedly limited study secondary to image noise related to patient body habitus. Despite this limitation, there are clear juxta diaphragmatic and  omental/ mesenteric hypermetabolic metastatic deposits. Comparing to the CT scan from 03/10/2014, the disease appears overall progressed.  PACs images reviewed.   MUGA pending for 09-10-14   Medications: I have reviewed the patient's current medications. She has antiemetics available at home.  DISCUSSION: we have reviewed doxil side effects, particularly skin reactions in areas of friction. We have discussed using lotion liberally, including lotion with coconut oil or olive oil, and increasing powder in skin folds. They understand that the doxil skin reactions can be cumulative with more doses. Will use ice packs to hands and feet during doxil. If doxil is beneficial, single agent would be better tolerated and certainly less possible complications than if avastin is added.   Assessment/Plan:  1.Endometrial carcinoma IA grade 2 at diagnosis March 2009, treated nonsurgically until weight loss of ~ 165 lbs allowed hysterectomy BSO 12-2009. 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 She has progressed on oral etoposide used 04-2014 thru 07-2014. If EF ok will begin q 4 week doxil in next couple of weeks, possibly on Tuesdays at family's request.  2.morbid obesity: weight increasing, may be ascites as some loculated ascites in region of spleen on recent PET. She needed paracenteses in past, which can be repeated (with cytology) if more symptomatic prior to hopefully some improvement related to chemo.  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 7.GERD controlled on protonix and  carafate 8. Cholelithiasis by CT 9.compression fractures T 10-31 Jan 2013 after fall, post kyphoplasties. Degenerative arthritis. Uses occasional percocet 10. flu vaccine done 11.no advance directives per EMR   All questions answered. Verbal consent for doxil given. Chemo orders placed for preauthorization, tho will have to confirm adequate EF prior to beginning treatment. TIme spent 40 min including >50% counseling and coordination of care.    Gordy Levan, MD   09/09/2014, 4:38 PM

## 2014-09-09 NOTE — Telephone Encounter (Signed)
Confirmed appointment for 06/02 for lab & infusion

## 2014-09-09 NOTE — Telephone Encounter (Signed)
Gave avs & calendar for June. Sent message to schedule treatment. °

## 2014-09-09 NOTE — Telephone Encounter (Signed)
Per staff message and P[facility] I have scheduled appts. Advised scheduler of appts. JMW  

## 2014-09-10 ENCOUNTER — Ambulatory Visit (HOSPITAL_COMMUNITY)
Admission: RE | Admit: 2014-09-10 | Discharge: 2014-09-10 | Disposition: A | Discharge status: Home / Self Care | Payer: BLUE CROSS/BLUE SHIELD | Source: Ambulatory Visit | Attending: Gynecologic Oncology | Admitting: Gynecologic Oncology

## 2014-09-10 DIAGNOSIS — C541 Malignant neoplasm of endometrium: Secondary | ICD-10-CM | POA: Diagnosis present

## 2014-09-10 DIAGNOSIS — Z136 Encounter for screening for cardiovascular disorders: Secondary | ICD-10-CM | POA: Diagnosis not present

## 2014-09-10 MED ORDER — TECHNETIUM TC 99M-LABELED RED BLOOD CELLS IV KIT
23.8000 | PACK | Freq: Once | INTRAVENOUS | Status: AC | PRN
  Administered 2014-09-10: 23.8 via INTRAVENOUS

## 2014-09-11 ENCOUNTER — Encounter: Payer: Self-pay | Admitting: Oncology

## 2014-09-11 NOTE — Progress Notes (Signed)
Medical Oncology  MUGA 09-10-14 with EF 62%, fine for doxil as planned.  Godfrey Pick, MD

## 2014-09-11 NOTE — Progress Notes (Signed)
[place] END OF TREATMENT   Name: CARMISHA LARUSSO Date: Sep 11, 2014  MRN: [id number] DOB: 11-Mar-1966   TREATMENT DATES: 04-27-14 thru 08-17-14   REFERRING PHYSICIAN: Janie Morning  DIAGNOSIS: advanced endometrial carcinoma  STAGE AT START OF TREATMENT: IIIC/IV   INTENT: control   DRUGS OR REGIMENS GIVEN: oral etoposide   MAJOR TOXICITIES: nausea, fatigue   REASON TREATMENT STOPPED: progression   PERFORMANCE STATUS AT END: ECOG 3 due to morbid obesity   ONGOING PROBLEMS: chemo anemia   FOLLOW UP PLANS: change in systemic treatment

## 2014-09-13 ENCOUNTER — Other Ambulatory Visit: Payer: Self-pay | Admitting: Physician Assistant

## 2014-09-13 ENCOUNTER — Telehealth: Payer: Self-pay | Admitting: Oncology

## 2014-09-13 ENCOUNTER — Telehealth: Payer: Self-pay | Admitting: *Deleted

## 2014-09-13 ENCOUNTER — Other Ambulatory Visit: Payer: Self-pay | Admitting: Radiology

## 2014-09-13 NOTE — Telephone Encounter (Signed)
-----   Message from Gordy Levan, MD sent at 09/12/2014  9:43 AM EDT ----- Labs seen and need follow up: please let her know heart function is good, so fine for the doxil as planned

## 2014-09-13 NOTE — Telephone Encounter (Signed)
Spoke with patient and she is aware of her new appointment °

## 2014-09-13 NOTE — Telephone Encounter (Signed)
Called and notified patient's mother, [name], of results as noted below by Dr. Marko Plume. [name] is agreeable to pass the results along to patient. Appts for 09/23/14 reviewed while on the phone.

## 2014-09-14 ENCOUNTER — Ambulatory Visit (HOSPITAL_COMMUNITY)
Admission: RE | Admit: 2014-09-14 | Discharge: 2014-09-14 | Disposition: A | Discharge status: Home / Self Care | Payer: BLUE CROSS/BLUE SHIELD | Source: Ambulatory Visit | Attending: Oncology | Admitting: Oncology

## 2014-09-14 ENCOUNTER — Other Ambulatory Visit: Payer: Self-pay | Admitting: Oncology

## 2014-09-14 ENCOUNTER — Encounter (HOSPITAL_COMMUNITY): Payer: Self-pay

## 2014-09-14 ENCOUNTER — Other Ambulatory Visit: Payer: Self-pay

## 2014-09-14 DIAGNOSIS — C541 Malignant neoplasm of endometrium: Secondary | ICD-10-CM | POA: Diagnosis not present

## 2014-09-14 LAB — CBC WITH DIFFERENTIAL/PLATELET
BASOS PCT: 0 % (ref 0–1)
Basophils Absolute: 0 10*3/uL (ref 0.0–0.1)
EOS PCT: 2 % (ref 0–5)
Eosinophils Absolute: 0.2 10*3/uL (ref 0.0–0.7)
HCT: 42.8 % (ref 36.0–46.0)
HEMOGLOBIN: 13.7 g/dL (ref 12.0–15.0)
Lymphocytes Relative: 16 % (ref 12–46)
Lymphs Abs: 1.4 10*3/uL (ref 0.7–4.0)
MCH: 33.6 pg (ref 26.0–34.0)
MCHC: 32 g/dL (ref 30.0–36.0)
MCV: 104.9 fL — ABNORMAL HIGH (ref 78.0–100.0)
Monocytes Absolute: 0.7 10*3/uL (ref 0.1–1.0)
Monocytes Relative: 8 % (ref 3–12)
NEUTROS PCT: 74 % (ref 43–77)
Neutro Abs: 6.3 10*3/uL (ref 1.7–7.7)
Platelets: 209 10*3/uL (ref 150–400)
RBC: 4.08 MIL/uL (ref 3.87–5.11)
RDW: 16.8 % — AB (ref 11.5–15.5)
WBC: 8.6 10*3/uL (ref 4.0–10.5)

## 2014-09-14 LAB — PROTIME-INR
INR: 1.1 (ref 0.00–1.49)
Prothrombin Time: 14.4 seconds (ref 11.6–15.2)

## 2014-09-14 MED ORDER — FENTANYL CITRATE (PF) 100 MCG/2ML IJ SOLN
INTRAMUSCULAR | Status: AC | PRN
Start: 2014-09-14 — End: 2014-09-14
  Administered 2014-09-14: 25 ug via INTRAVENOUS

## 2014-09-14 MED ORDER — DEXTROSE 5 % IV SOLN
3.0000 g | INTRAVENOUS | Status: AC
  Administered 2014-09-14: 3 g via INTRAVENOUS
  Filled 2014-09-14: qty 3000

## 2014-09-14 MED ORDER — HEPARIN SOD (PORK) LOCK FLUSH 100 UNIT/ML IV SOLN
INTRAVENOUS | Status: AC | PRN
Start: 2014-09-14 — End: 2014-09-14
  Administered 2014-09-14: 500 [IU]

## 2014-09-14 MED ORDER — HEPARIN SOD (PORK) LOCK FLUSH 100 UNIT/ML IV SOLN
INTRAVENOUS | Status: AC
  Filled 2014-09-14: qty 5

## 2014-09-14 MED ORDER — LIDOCAINE HCL 1 % IJ SOLN
INTRAMUSCULAR | Status: AC
Start: 2014-09-14 — End: 2014-09-14
  Filled 2014-09-14: qty 20

## 2014-09-14 MED ORDER — MIDAZOLAM HCL 2 MG/2ML IJ SOLN
INTRAMUSCULAR | Status: AC | PRN
  Administered 2014-09-14: 0.5 mg via INTRAVENOUS
  Administered 2014-09-14: 1 mg via INTRAVENOUS

## 2014-09-14 MED ORDER — FENTANYL CITRATE (PF) 100 MCG/2ML IJ SOLN
INTRAMUSCULAR | Status: AC
  Filled 2014-09-14: qty 4

## 2014-09-14 MED ORDER — MIDAZOLAM HCL 2 MG/2ML IJ SOLN
INTRAMUSCULAR | Status: AC
  Filled 2014-09-14: qty 6

## 2014-09-14 MED ORDER — SODIUM CHLORIDE 0.9 % IV SOLN
INTRAVENOUS | Status: DC
  Administered 2014-09-14: 12:00:00 via INTRAVENOUS

## 2014-09-14 NOTE — Procedures (Signed)
Interventional Radiology Procedure Note  Procedure: Placement of a right IJ approach single lumen PowerPort.  Tip is positioned at the superior cavoatrial junction and catheter is ready for immediate use.  Complications: No immediate Recommendations:  - Ok to shower tomorrow - Do not submerge for 7 days - Routine line care   Dayane Hillenburg T. Ozzie Knobel, M.D Pager:  [phone]   

## 2014-09-14 NOTE — Progress Notes (Signed)
Patient ID: [name], female   DOB: 1965-06-13, 49 y.o.   MRN: [id number]    Referring Physician(s): Livesay,Lennis P  Subjective: Patient familiar to IR service from prior paracenteses, as well as L1/2 vertebroplasty 02/2013 and Port-A-Cath placement in 10/2012 which was subsequently removed in 03/2013 secondary to right upper extremity DVT  .She has history of progressive endometrial cancer and presents today for new Port-A-Cath placement for chemotherapy. She currently denies fevers, chills, chest pain, dyspnea, nausea, vomiting.   Allergies: Review of patient's allergies indicates no known allergies.  Medications: Prior to Admission medications   Medication Sig Start Date End Date Taking? Authorizing Provider  Ascorbic Acid (VITAMIN C) 1000 MG tablet Take 1,000 mg by mouth daily.   Yes Historical Provider, MD  b complex vitamins capsule Take 1 capsule by mouth daily.   Yes Historical Provider, MD  calcium carbonate (OS-CAL) 600 MG TABS tablet Take 600 mg by mouth daily.   Yes Historical Provider, MD  Cholecalciferol (VITAMIN D3) 2000 UNITS TABS Take 1 tablet by mouth daily.   Yes Historical Provider, MD  [facility] ANTI-FUNGAL 2 % powder Apply 1 application topically as needed for itching. Use as directed 03/01/14  Yes Historical Provider, MD  Cyanocobalamin (VITAMIN B-12 PO) Take 2,500 mcg by mouth daily.    Yes Historical Provider, MD  cyclobenzaprine (FLEXERIL) 5 MG tablet Take 1 tablet (5 mg total) by mouth 3 (three) times daily as needed for muscle spasms. 03/23/14  Yes Lennis Marion Downer, MD  ferrous sulfate 325 (65 FE) MG tablet Take 1 tablet (325 mg total) by mouth 3 (three) times daily with meals. 06/30/14  Yes Lennis Marion Downer, MD  folic acid (FOLVITE) 938 MCG tablet Take 400 mcg by mouth daily.     Yes Historical Provider, MD  fondaparinux (ARIXTRA) 10 MG/0.8ML SOLN injection INJECT 1 SYRINGE SUBCUTANEOUSLY ONCE A DAY 06/07/14  Yes Lennis Marion Downer, MD  gabapentin (NEURONTIN) 100 MG  capsule Take 2 capsules (200 mg total) by mouth 3 (three) times daily. 08/30/14  Yes Lennis P Livesay, MD  KLOR-CON M10 10 MEQ tablet Take 10 mEq by mouth daily. 08/04/14  Yes Historical Provider, MD  loratadine (CLARITIN) 10 MG tablet Take 10 mg by mouth daily.   Yes Historical Provider, MD  magnesium gluconate (MAGONATE) 500 MG tablet Take 500 mg by mouth daily.   Yes Historical Provider, MD  miconazole nitrate (MICATIN) POWD Apply 1 application topically 4 (four) times daily. 03/01/14  Yes Bonnielee Haff, MD  Multiple Vitamins-Minerals (MULTIVITAMIN WITH MINERALS) tablet Take 1 tablet by mouth daily.    Yes Historical Provider, MD  nystatin ointment (MYCOSTATIN) Apply 1 application topically 2 (two) times daily. 04/29/14  Yes Lennis P Livesay, MD  ondansetron (ZOFRAN) 8 MG tablet TAKE 1 TABLET BY MOUTH TWICE A DAY AS NEEDED START DAY AFTER CHEMO Patient taking differently: Take 8 mg by mouth 2 (two) times daily as needed for nausea or vomiting. START DAY AFTER CHEMO 01/29/14  Yes Lennis Marion Downer, MD  oxyCODONE-acetaminophen (PERCOCET) 10-325 MG per tablet Take 1 tablet by mouth every 12 (twelve) hours as needed for pain. 04/19/14  Yes Lennis Marion Downer, MD  pantoprazole (PROTONIX) 40 MG tablet Take 1 tablet (40 mg total) by mouth daily. 05/07/14  Yes Lennis Marion Downer, MD  prochlorperazine (COMPAZINE) 10 MG tablet Take 1 tablet (10 mg total) by mouth every 6 (six) hours as needed for nausea or vomiting. 08/30/14  Yes Lennis Marion Downer, MD  promethazine (PHENERGAN)  25 MG tablet Take 0.5 tablets (12.5 mg total) by mouth every 6 (six) hours as needed for nausea or vomiting. 07/15/14  Yes Lennis P Livesay, MD  lidocaine-prilocaine (EMLA) cream Apply 1 application topically as needed. Patient not taking: Reported on 09/14/2014 09/09/14   Lennis P Marko Plume, MD  ondansetron (ZOFRAN ODT) 8 MG disintegrating tablet Take 1 tablet (8 mg total) by mouth every 8 (eight) hours as needed for nausea or vomiting. Patient not  taking: Reported on 09/09/2014 05/28/13   Wilmon Arms, MD     Vital Signs: BP 106/71 mmHg  Pulse 86  Temp(Src) 97.6 F (36.4 C) (Oral)  Resp 20  SpO2 92%  Physical Exam patient awake, alert. Chest with slightly diminished breath sounds at bases . Heart with regular rate and rhythm. Abdomen morbidly obese, positive bowel sounds, mild distention. Extremities with changes of chronic venous stasis in 1-2+ edema bilaterally   Imaging: No results found.  Labs:  CBC:  Recent Labs  07/26/14 1309 08/30/14 0916 09/09/14 0857 09/14/14 1020  WBC 5.8 6.6 8.1 8.6  HGB 12.8 12.5 12.8 13.7  HCT 39.0 38.5 40.1 42.8  PLT 179 186 183 209    COAGS: No results for input(s): INR, APTT in the last 8760 hours.  BMP:  Recent Labs  02/26/14 0520 02/27/14 0600 02/28/14 0605 03/01/14 0500  06/24/14 1306 07/26/14 1309 08/30/14 0916 09/09/14 0858  NA 134* 138 137 139  < > 139 141 142 141  K 2.9* 3.1* 3.3* 3.6*  < > 3.6 3.9 4.1 3.8  CL 94* 98 98 99  --   --   --   --   --   CO2 27 27 27 28   < > 27 29 28 27   GLUCOSE 122* 120* 121* 104*  < > 130 89 114 117  BUN 11 9 9 9   < > 11.4 8.5 9.0 12.1  CALCIUM 8.2* 8.5 8.5 8.8  < > 9.7 9.6 9.2 8.7  CREATININE 0.73 0.64 0.64 0.69  < > 0.8 0.7 0.8 0.8  GFRNONAA >90 >90 >90 >90  --   --   --   --   --   GFRAA >90 >90 >90 >90  --   --   --   --   --   < > = values in this interval not displayed.  LIVER FUNCTION TESTS:  Recent Labs  06/24/14 1306 07/26/14 1309 08/30/14 0916 09/09/14 0858  BILITOT 0.50 0.67 0.50 0.50  AST 26 28 27 24   ALT 26 28 26 20   ALKPHOS 71 75 66 63  PROT 7.0 7.2 6.4 6.4  ALBUMIN 3.4* 3.5 3.1* 2.9*    Assessment and Plan: Patient familiar to IR service from prior paracenteses, as well as L1/2 vertebroplasty 02/2013 and Port-A-Cath placement in 10/2012 which was subsequently removed in 03/2013 secondary to right upper extremity DVT  .She has history of progressive endometrial cancer and presents today for new  Port-A-Cath placement for chemotherapy.Risks and benefits discussed with the patient/family including, but not limited to bleeding, infection, pneumothorax, or fibrin sheath development and need for additional procedures. All of the patient's questions were answered, patient is agreeable to proceed. Consent signed and in chart.     Signed: D. Rowe Robert 09/14/2014, 10:54 AM   I spent a total of 15 minutes in face to face in clinical consultation/evaluation, greater than 50% of which was counseling/coordinating care for Port-A-Cath placement

## 2014-09-14 NOTE — Discharge Instructions (Signed)
Remove bandage and gauze tomorrow and shower. Call MD if any redness, drainage, or swelling at incision site. Call for fever or increased pain.    Implanted Port Insertion, Care After Refer to this sheet in the next few weeks. These instructions provide you with information on caring for yourself after your procedure. Your health care provider may also give you more specific instructions. Your treatment has been planned according to current medical practices, but problems sometimes occur. Call your health care provider if you have any problems or questions after your procedure. WHAT TO EXPECT AFTER THE PROCEDURE After your procedure, it is typical to have the following:   Discomfort at the port insertion site. Ice packs to the area will help.  Bruising on the skin over the port. This will subside in 3-4 days. HOME CARE INSTRUCTIONS  After your port is placed, you will get a manufacturer's information card. The card has information about your port. Keep this card with you at all times.   Know what kind of port you have. There are many types of ports available.   Wear a medical alert bracelet in case of an emergency. This can help alert health care workers that you have a port.   The port can stay in for as long as your health care provider believes it is necessary.   A home health care nurse may give medicines and take care of the port.   You or a family member can get special training and directions for giving medicine and taking care of the port at home.  SEEK MEDICAL CARE IF:   Your port does not flush or you are unable to get a blood return.   You have a fever or chills. SEEK IMMEDIATE MEDICAL CARE IF:  You have new fluid or pus coming from your incision.   You notice a bad smell coming from your incision site.   You have swelling, pain, or more redness at the incision or port site.   You have chest pain or shortness of breath. Document Released: 01/28/2013 Document  Revised: 04/14/2013 Document Reviewed: 01/28/2013 [facility] Patient Information 2015 [place], [place]. This information is not intended to replace advice given to you by your health care provider. Make sure you discuss any questions you have with your health care provider. Implanted [facility] Guide An implanted port is a type of central line that is placed under the skin. Central lines are used to provide IV access when treatment or nutrition needs to be given through a person's veins. Implanted ports are used for long-term IV access. An implanted port may be placed because:   You need IV medicine that would be irritating to the small veins in your hands or arms.   You need long-term IV medicines, such as antibiotics.   You need IV nutrition for a long period.   You need frequent blood draws for lab tests.   You need dialysis.  Implanted ports are usually placed in the chest area, but they can also be placed in the upper arm, the abdomen, or the leg. An implanted port has two main parts:   Reservoir. The reservoir is round and will appear as a small, raised area under your skin. The reservoir is the part where a needle is inserted to give medicines or draw blood.   Catheter. The catheter is a thin, flexible tube that extends from the reservoir. The catheter is placed into a large vein. Medicine that is inserted into the reservoir  goes into the catheter and then into the vein.  HOW WILL I CARE FOR MY INCISION SITE? Do not get the incision site wet. Bathe or shower as directed by your health care provider.  HOW IS MY PORT ACCESSED? Special steps must be taken to access the port:   Before the port is accessed, a numbing cream can be placed on the skin. This helps numb the skin over the port site.   Your health care provider uses a sterile technique to access the port.  Your health care provider must put on a mask and sterile gloves.  The skin over your port is cleaned carefully with  an antiseptic and allowed to dry.  The port is gently pinched between sterile gloves, and a needle is inserted into the port.  Only "non-coring" port needles should be used to access the port. Once the port is accessed, a blood return should be checked. This helps ensure that the port is in the vein and is not clogged.   If your port needs to remain accessed for a constant infusion, a clear (transparent) bandage will be placed over the needle site. The bandage and needle will need to be changed every week, or as directed by your health care provider.   Keep the bandage covering the needle clean and dry. Do not get it wet. Follow your health care provider's instructions on how to take a shower or bath while the port is accessed.   If your port does not need to stay accessed, no bandage is needed over the port.  WHAT IS FLUSHING? Flushing helps keep the port from getting clogged. Follow your health care provider's instructions on how and when to flush the port. Ports are usually flushed with saline solution or a medicine called heparin. The need for flushing will depend on how the port is used.   If the port is used for intermittent medicines or blood draws, the port will need to be flushed:   After medicines have been given.   After blood has been drawn.   As part of routine maintenance.   If a constant infusion is running, the port may not need to be flushed.  HOW LONG WILL MY PORT STAY IMPLANTED? The port can stay in for as long as your health care provider thinks it is needed. When it is time for the port to come out, surgery will be done to remove it. The procedure is similar to the one performed when the port was put in.  WHEN SHOULD I SEEK IMMEDIATE MEDICAL CARE? When you have an implanted port, you should seek immediate medical care if:   You notice a bad smell coming from the incision site.   You have swelling, redness, or drainage at the incision site.   You have  more swelling or pain at the port site or the surrounding area.   You have a fever that is not controlled with medicine. Document Released: 04/09/2005 Document Revised: 01/28/2013 Document Reviewed: 12/15/2012 [facility] Patient Information 2015 [place], [place]. This information is not intended to replace advice given to you by your health care provider. Make sure you discuss any questions you have with your health care provider.     Conscious Sedation Sedation is the use of medicines to promote relaxation and relieve discomfort and anxiety. Conscious sedation is a type of sedation. Under conscious sedation you are less alert than normal but are still able to respond to instructions or stimulation. Conscious sedation is used  during short medical and dental procedures. It is milder than deep sedation or general anesthesia and allows you to return to your regular activities sooner.  LET [facility] CARE PROVIDER KNOW ABOUT:   Any allergies you have.  All medicines you are taking, including vitamins, herbs, eye drops, creams, and over-the-counter medicines.  Use of steroids (by mouth or creams).  Previous problems you or members of your family have had with the use of anesthetics.  Any blood disorders you have.  Previous surgeries you have had.  Medical conditions you have.  Possibility of pregnancy, if this applies.  Use of cigarettes, alcohol, or illegal drugs. RISKS AND COMPLICATIONS Generally, this is a safe procedure. However, as with any procedure, problems can occur. Possible problems include:  Oversedation.  Trouble breathing on your own. You may need to have a breathing tube until you are awake and breathing on your own.  Allergic reaction to any of the medicines used for the procedure. BEFORE THE PROCEDURE  You may have blood tests done. These tests can help show how well your kidneys and liver are working. They can also show how well your blood clots.  A physical exam  will be done.  Only take medicines as directed by your health care provider. You may need to stop taking medicines (such as blood thinners, aspirin, or nonsteroidal anti-inflammatory drugs) before the procedure.   Do not eat or drink at least 6 hours before the procedure or as directed by your health care provider.  Arrange for a responsible adult, family member, or friend to take you home after the procedure. He or she should stay with you for at least 24 hours after the procedure, until the medicine has worn off. PROCEDURE   An intravenous (IV) catheter will be inserted into one of your veins. Medicine will be able to flow directly into your body through this catheter. You may be given medicine through this tube to help prevent pain and help you relax.  The medical or dental procedure will be done. AFTER THE PROCEDURE  You will stay in a recovery area until the medicine has worn off. Your blood pressure and pulse will be checked.   Depending on the procedure you had, you may be allowed to go home when you can tolerate liquids and your pain is under control. Document Released: 01/02/2001 Document Revised: 04/14/2013 Document Reviewed: 12/15/2012 [facility] Patient Information 2015 [place], [place]. This information is not intended to replace advice given to you by your health care provider. Make sure you discuss any questions you have with your health care provider.

## 2014-09-15 ENCOUNTER — Telehealth: Payer: Self-pay

## 2014-09-15 NOTE — Telephone Encounter (Signed)
Told patient's [name] that Dr. Marko Plume suggests bring Ice pack bags to treatment with Doxil.  These can be filled in the infusion room and placed on hands and feet during the Doxil infusion to lessen skin irritation there from chemotherapy. [name] has these at her home and will let [name] and her mother knowabout this plan.

## 2014-09-17 ENCOUNTER — Other Ambulatory Visit: Payer: Self-pay | Admitting: Oncology

## 2014-09-17 DIAGNOSIS — Z7901 Long term (current) use of anticoagulants: Secondary | ICD-10-CM

## 2014-09-22 ENCOUNTER — Other Ambulatory Visit: Payer: BLUE CROSS/BLUE SHIELD

## 2014-09-22 ENCOUNTER — Other Ambulatory Visit: Payer: Self-pay

## 2014-09-22 DIAGNOSIS — C541 Malignant neoplasm of endometrium: Secondary | ICD-10-CM

## 2014-09-23 ENCOUNTER — Other Ambulatory Visit: Payer: Self-pay | Admitting: Oncology

## 2014-09-23 ENCOUNTER — Other Ambulatory Visit (HOSPITAL_BASED_OUTPATIENT_CLINIC_OR_DEPARTMENT_OTHER): Payer: BLUE CROSS/BLUE SHIELD

## 2014-09-23 ENCOUNTER — Ambulatory Visit (HOSPITAL_BASED_OUTPATIENT_CLINIC_OR_DEPARTMENT_OTHER): Payer: BLUE CROSS/BLUE SHIELD

## 2014-09-23 VITALS — BP 118/93 | HR 88 | Temp 97.7°F | Ht 71.0 in | Wt >= 6400 oz

## 2014-09-23 DIAGNOSIS — Z5111 Encounter for antineoplastic chemotherapy: Secondary | ICD-10-CM | POA: Diagnosis not present

## 2014-09-23 DIAGNOSIS — C541 Malignant neoplasm of endometrium: Secondary | ICD-10-CM

## 2014-09-23 DIAGNOSIS — C786 Secondary malignant neoplasm of retroperitoneum and peritoneum: Secondary | ICD-10-CM | POA: Diagnosis not present

## 2014-09-23 LAB — CBC WITH DIFFERENTIAL/PLATELET
BASO%: 0.2 % (ref 0.0–2.0)
Basophils Absolute: 0 10*3/uL (ref 0.0–0.1)
EOS%: 2.7 % (ref 0.0–7.0)
Eosinophils Absolute: 0.3 10*3/uL (ref 0.0–0.5)
HCT: 40.8 % (ref 34.8–46.6)
HEMOGLOBIN: 13.1 g/dL (ref 11.6–15.9)
LYMPH#: 1.4 10*3/uL (ref 0.9–3.3)
LYMPH%: 14.4 % (ref 14.0–49.7)
MCH: 32.8 pg (ref 25.1–34.0)
MCHC: 32.1 g/dL (ref 31.5–36.0)
MCV: 102 fL — AB (ref 79.5–101.0)
MONO#: 0.7 10*3/uL (ref 0.1–0.9)
MONO%: 7 % (ref 0.0–14.0)
NEUT#: 7.2 10*3/uL — ABNORMAL HIGH (ref 1.5–6.5)
NEUT%: 75.7 % (ref 38.4–76.8)
Platelets: 253 10*3/uL (ref 145–400)
RBC: 4 10*6/uL (ref 3.70–5.45)
RDW: 16.4 % — ABNORMAL HIGH (ref 11.2–14.5)
WBC: 9.5 10*3/uL (ref 3.9–10.3)

## 2014-09-23 LAB — COMPREHENSIVE METABOLIC PANEL (CC13)
ALT: 15 U/L (ref 0–55)
AST: 25 U/L (ref 5–34)
Albumin: 2.6 g/dL — ABNORMAL LOW (ref 3.5–5.0)
Alkaline Phosphatase: 57 U/L (ref 40–150)
Anion Gap: 12 mEq/L — ABNORMAL HIGH (ref 3–11)
BUN: 10.3 mg/dL (ref 7.0–26.0)
CALCIUM: 8.9 mg/dL (ref 8.4–10.4)
CHLORIDE: 103 meq/L (ref 98–109)
CO2: 25 mEq/L (ref 22–29)
CREATININE: 0.8 mg/dL (ref 0.6–1.1)
EGFR: 83 mL/min/{1.73_m2} — AB (ref >90)
GLUCOSE: 127 mg/dL (ref 70–140)
POTASSIUM: 3.7 meq/L (ref 3.5–5.1)
Sodium: 140 mEq/L (ref 136–145)
Total Bilirubin: 0.45 mg/dL (ref 0.20–1.20)
Total Protein: 6.3 g/dL — ABNORMAL LOW (ref 6.4–8.3)

## 2014-09-23 MED ORDER — DOXORUBICIN HCL LIPOSOMAL CHEMO INJECTION 2 MG/ML
46.0000 mg/m2 | Freq: Once | INTRAVENOUS | Status: AC
  Administered 2014-09-23: 120 mg via INTRAVENOUS
  Filled 2014-09-23: qty 60

## 2014-09-23 MED ORDER — SODIUM CHLORIDE 0.9 % IV SOLN
Freq: Once | INTRAVENOUS | Status: AC
  Administered 2014-09-23: 10:00:00 via INTRAVENOUS

## 2014-09-23 MED ORDER — SODIUM CHLORIDE 0.9 % IV SOLN
Freq: Once | INTRAVENOUS | Status: AC
  Administered 2014-09-23: 11:00:00 via INTRAVENOUS
  Filled 2014-09-23: qty 4

## 2014-09-23 MED ORDER — HEPARIN SOD (PORK) LOCK FLUSH 100 UNIT/ML IV SOLN
500.0000 [IU] | Freq: Once | INTRAVENOUS | Status: AC | PRN
  Administered 2014-09-23: 500 [IU]
  Filled 2014-09-23: qty 5

## 2014-09-23 MED ORDER — SODIUM CHLORIDE 0.9 % IJ SOLN
10.0000 mL | INTRAMUSCULAR | Status: DC | PRN
  Administered 2014-09-23: 10 mL
  Filled 2014-09-23: qty 10

## 2014-09-23 NOTE — Patient Instructions (Signed)
[facility] Discharge Instructions for Patients Receiving Chemotherapy  Today you received the following chemotherapy agents:Doxil.  To help prevent nausea and vomiting after your treatment, we encourage you to take your nausea medication. If you develop nausea and vomiting that is not controlled by your nausea medication, call the clinic.   BELOW ARE SYMPTOMS THAT SHOULD BE REPORTED IMMEDIATELY:  *FEVER GREATER THAN 100.5 F  *CHILLS WITH OR WITHOUT FEVER  NAUSEA AND VOMITING THAT IS NOT CONTROLLED WITH YOUR NAUSEA MEDICATION  *UNUSUAL SHORTNESS OF BREATH  *UNUSUAL BRUISING OR BLEEDING  TENDERNESS IN MOUTH AND THROAT WITH OR WITHOUT PRESENCE OF ULCERS  *URINARY PROBLEMS  *BOWEL PROBLEMS  UNUSUAL RASH Items with * indicate a potential emergency and should be followed up as soon as possible.  Feel free to call the clinic you have any questions or concerns. The clinic phone number is [phone].  Please show the CHEMO ALERT CARD at check-in to the Emergency Department and triage nurse.   

## 2014-09-24 ENCOUNTER — Telehealth: Payer: Self-pay

## 2014-09-24 NOTE — Telephone Encounter (Signed)
Patient doing well. No nausea or vomiting.  Drinking fluids and bowels moving well. Reviewed the caution of avoiding pressure tightness on skin for the next 3-5 days and using ice packs on hands and feet ~tid to help prevent redness peeling of skin.

## 2014-09-24 NOTE — Telephone Encounter (Signed)
-----   Message from Earlie Counts sent at 09/23/2014  2:49 PM EDT ----- Regarding: Chemo followup 1st Doxil Did well Dr. Jana Hakim

## 2014-09-28 ENCOUNTER — Other Ambulatory Visit: Payer: BLUE CROSS/BLUE SHIELD

## 2014-09-30 ENCOUNTER — Other Ambulatory Visit: Payer: Self-pay | Admitting: Oncology

## 2014-09-30 ENCOUNTER — Inpatient Hospital Stay (HOSPITAL_COMMUNITY): Admission: RE | Admit: 2014-09-30 | Payer: BLUE CROSS/BLUE SHIELD | Source: Ambulatory Visit

## 2014-09-30 ENCOUNTER — Telehealth: Payer: Self-pay | Admitting: Oncology

## 2014-09-30 ENCOUNTER — Telehealth: Payer: Self-pay | Admitting: *Deleted

## 2014-09-30 DIAGNOSIS — C541 Malignant neoplasm of endometrium: Secondary | ICD-10-CM

## 2014-09-30 NOTE — Telephone Encounter (Signed)
Note continued:  Will request paracentesis today if possible, and MD will try to coordinate visit also today around procedure.  MD called radiology [facility] and [facility].  RN to call back re possibly having paracentesis at [facility] and to coordinate MD visit here.  L.Ettie Krontz

## 2014-09-30 NOTE — Telephone Encounter (Signed)
Medical Oncology  Called mother back after her conversation with triage RN. Patient uncomfortable with fullness upper abdomen, SOB with this. Has had oozing from site of LMW heparin injection done last PM.  Patient's father and aunt also at the home, television loud in background.

## 2014-09-30 NOTE — Telephone Encounter (Signed)
Spoke with [name] and her mother at 8 this am. Told them that a paracentesis could be done today at [facility]. [name] was feeling better with taking a 12.5 mg dose of phenergan.   She did not want to go to [employer].  She choose to have paracentesis tomorrow at [facility] at 1000.  She needs to register at [place] in radiology. [name] also felt that she did not need to see Dr. Marko Plume today. Dr. Marko Plume notified of patient's improvement in n/v and choice of paracentesis 10-01-14  Told [name] that Dr. Marko Plume suggested that she go the ED if her breathing worsens prior to paracentesis in am.  [name] and her mother verbalized understanding.

## 2014-09-30 NOTE — Telephone Encounter (Signed)
INCREASE FLUID "EVERYWHERE" SINCE YESTERDAY. PT. IS VERY UNCOMFORTABLE AND CRYING. CHANGING POSITIONS DOES NOT HELP. SHE IS UNABLE TO EAT AND HAS VOMITED TWICE IN THE PAST 24 HOURS. PT. HAS HAD FOUR LOOSE STOOLS IN THE PAST 24 HOURS. NO FEVER. ASKED PT.'S MOTHER IF SHE COULD BRING THE PT. TO THE OFFICE. SHE SAID IF NOT IN THE CAR SHE WOULD HAVE HER COME BY AMBULANCE. THIS NOTE ROUTED AND PLACED ON DR.LIVESAY'S DESK.

## 2014-10-01 ENCOUNTER — Ambulatory Visit (HOSPITAL_COMMUNITY)
Admission: RE | Admit: 2014-10-01 | Discharge: 2014-10-01 | Disposition: A | Discharge status: Home / Self Care | Payer: BLUE CROSS/BLUE SHIELD | Source: Ambulatory Visit | Attending: Oncology | Admitting: Oncology

## 2014-10-01 ENCOUNTER — Ambulatory Visit (HOSPITAL_COMMUNITY): Admission: RE | Admit: 2014-10-01 | Payer: BLUE CROSS/BLUE SHIELD | Source: Ambulatory Visit

## 2014-10-01 ENCOUNTER — Telehealth: Payer: Self-pay

## 2014-10-01 DIAGNOSIS — R188 Other ascites: Secondary | ICD-10-CM | POA: Insufficient documentation

## 2014-10-01 DIAGNOSIS — C541 Malignant neoplasm of endometrium: Secondary | ICD-10-CM

## 2014-10-01 NOTE — Telephone Encounter (Addendum)
[name] mother called stating that the paracentesis done today yield 7 liters of fluid.  She stated that there was more fluid but [name]'s BP lowered  and the procedure was stopped.

## 2014-10-01 NOTE — Procedures (Signed)
Ultrasound-guided therapeutic paracentesis performed yielding 7 L amber fluid. No immediate complications.

## 2014-10-01 NOTE — Telephone Encounter (Signed)
Spoke with [name]'s mother.  [name] was able to eat a chicken biscuit, a half at a time,and 8 oz of fluid after procedure.   Her breathing is not labored now.  She is able to have her head lower with out difficulty breathing. Told [name] that Dr. Marko Plume said that another paracentesis next week would be fine since there was still fluid in abdomen per [place]. Order placed for paracentesis to be scheduled.

## 2014-10-03 ENCOUNTER — Other Ambulatory Visit: Payer: Self-pay | Admitting: Oncology

## 2014-10-04 ENCOUNTER — Other Ambulatory Visit: Payer: Self-pay

## 2014-10-04 DIAGNOSIS — D539 Nutritional anemia, unspecified: Secondary | ICD-10-CM

## 2014-10-04 DIAGNOSIS — C786 Secondary malignant neoplasm of retroperitoneum and peritoneum: Secondary | ICD-10-CM

## 2014-10-04 MED ORDER — FERROUS SULFATE 325 (65 FE) MG PO TABS: 325.0000 mg | ORAL_TABLET | Freq: Three times a day (TID) | ORAL | Status: DC

## 2014-10-04 MED ORDER — FERROUS SULFATE 325 (65 FE) MG PO TABS: 325.0000 mg | ORAL_TABLET | Freq: Three times a day (TID) | ORAL | Status: AC

## 2014-10-06 ENCOUNTER — Ambulatory Visit (HOSPITAL_COMMUNITY)
Admission: RE | Admit: 2014-10-06 | Discharge: 2014-10-06 | Disposition: A | Discharge status: Home / Self Care | Payer: BLUE CROSS/BLUE SHIELD | Source: Ambulatory Visit | Attending: Oncology | Admitting: Oncology

## 2014-10-06 DIAGNOSIS — R188 Other ascites: Secondary | ICD-10-CM | POA: Diagnosis not present

## 2014-10-06 DIAGNOSIS — C541 Malignant neoplasm of endometrium: Secondary | ICD-10-CM

## 2014-10-06 NOTE — Procedures (Signed)
Successful US guided paracentesis from RUQ.  Yielded 10 liters of serous fluid.  No immediate complications.  Pt tolerated well.   Specimen was not sent for labs.  Tsosie Billing D PA-C 10/06/2014 3:25 PM

## 2014-10-07 ENCOUNTER — Ambulatory Visit (HOSPITAL_BASED_OUTPATIENT_CLINIC_OR_DEPARTMENT_OTHER): Payer: BLUE CROSS/BLUE SHIELD | Admitting: Oncology

## 2014-10-07 ENCOUNTER — Ambulatory Visit (HOSPITAL_COMMUNITY)
Admission: RE | Admit: 2014-10-07 | Discharge: 2014-10-07 | Disposition: A | Discharge status: Home / Self Care | Payer: BLUE CROSS/BLUE SHIELD | Source: Ambulatory Visit | Attending: Oncology | Admitting: Oncology

## 2014-10-07 ENCOUNTER — Other Ambulatory Visit (HOSPITAL_BASED_OUTPATIENT_CLINIC_OR_DEPARTMENT_OTHER): Payer: BLUE CROSS/BLUE SHIELD

## 2014-10-07 ENCOUNTER — Encounter: Payer: Self-pay | Admitting: Nurse Practitioner

## 2014-10-07 ENCOUNTER — Encounter: Payer: Self-pay | Admitting: Oncology

## 2014-10-07 VITALS — BP 118/79 | HR 97 | Temp 97.6°F | Resp 18 | Ht 71.0 in | Wt >= 6400 oz

## 2014-10-07 DIAGNOSIS — C541 Malignant neoplasm of endometrium: Secondary | ICD-10-CM | POA: Diagnosis not present

## 2014-10-07 DIAGNOSIS — Z7901 Long term (current) use of anticoagulants: Secondary | ICD-10-CM

## 2014-10-07 DIAGNOSIS — R188 Other ascites: Secondary | ICD-10-CM | POA: Diagnosis present

## 2014-10-07 DIAGNOSIS — I82409 Acute embolism and thrombosis of unspecified deep veins of unspecified lower extremity: Secondary | ICD-10-CM

## 2014-10-07 DIAGNOSIS — Z95828 Presence of other vascular implants and grafts: Secondary | ICD-10-CM

## 2014-10-07 LAB — CBC WITH DIFFERENTIAL/PLATELET
BASO%: 0.3 % (ref 0.0–2.0)
BASOS ABS: 0 10*3/uL (ref 0.0–0.1)
EOS%: 2.5 % (ref 0.0–7.0)
Eosinophils Absolute: 0.2 10*3/uL (ref 0.0–0.5)
HCT: 39.3 % (ref 34.8–46.6)
HEMOGLOBIN: 12.5 g/dL (ref 11.6–15.9)
LYMPH#: 1.2 10*3/uL (ref 0.9–3.3)
LYMPH%: 16.9 % (ref 14.0–49.7)
MCH: 31.7 pg (ref 25.1–34.0)
MCHC: 31.8 g/dL (ref 31.5–36.0)
MCV: 99.7 fL (ref 79.5–101.0)
MONO#: 0.5 10*3/uL (ref 0.1–0.9)
MONO%: 7.1 % (ref 0.0–14.0)
NEUT#: 5.4 10*3/uL (ref 1.5–6.5)
NEUT%: 73.2 % (ref 38.4–76.8)
Platelets: 303 10*3/uL (ref 145–400)
RBC: 3.94 10*6/uL (ref 3.70–5.45)
RDW: 16.4 % — AB (ref 11.2–14.5)
WBC: 7.3 10*3/uL (ref 3.9–10.3)

## 2014-10-07 LAB — COMPREHENSIVE METABOLIC PANEL (CC13)
ALBUMIN: 1.9 g/dL — AB (ref 3.5–5.0)
ALT: 16 U/L (ref 0–55)
ANION GAP: 8 meq/L (ref 3–11)
AST: 24 U/L (ref 5–34)
Alkaline Phosphatase: 62 U/L (ref 40–150)
BUN: 6.6 mg/dL — ABNORMAL LOW (ref 7.0–26.0)
CALCIUM: 8.7 mg/dL (ref 8.4–10.4)
CO2: 31 meq/L — AB (ref 22–29)
Chloride: 99 mEq/L (ref 98–109)
Creatinine: 0.8 mg/dL (ref 0.6–1.1)
GLUCOSE: 93 mg/dL (ref 70–140)
POTASSIUM: 3.8 meq/L (ref 3.5–5.1)
SODIUM: 139 meq/L (ref 136–145)
TOTAL PROTEIN: 5.8 g/dL — AB (ref 6.4–8.3)
Total Bilirubin: 0.54 mg/dL (ref 0.20–1.20)

## 2014-10-07 MED ORDER — OXYCODONE-ACETAMINOPHEN 10-325 MG PO TABS: 1.0000 | ORAL_TABLET | Freq: Two times a day (BID) | ORAL | Status: AC | PRN

## 2014-10-07 MED ORDER — CYCLOBENZAPRINE HCL 5 MG PO TABS: 5.0000 mg | ORAL_TABLET | Freq: Three times a day (TID) | ORAL | Status: AC | PRN

## 2014-10-07 NOTE — Procedures (Signed)
Successful US guided paracentesis from RLQ.  Yielded 10.1 liters of serous fluid.  No immediate complications.  Pt tolerated well.   Specimen was not sent for labs.  Tsosie Billing D PA-C 10/07/2014 3:55 PM

## 2014-10-07 NOTE — Progress Notes (Signed)
Per Dr. Marko Plume, [facility] to hold Arixtra x4 days for pleurex placement to be done Monday 10/11/14.

## 2014-10-07 NOTE — Progress Notes (Signed)
OFFICE PROGRESS NOTE   October 07, 2014   Physicians:(K.Khan), W.Brewster, Orpah Melter, [place].Tsuei  INTERVAL HISTORY:  Patient is seen, together with mother and aunt, in continuing attention to endometrial cancer, which is progressive including rapidly recurring, large volume ascites. She had first doxil on 11-22-99, already complicated by skin irritation and breakdown in groin areas bilaterally. She has had a very difficult time over past couple of weeks despite paracentesis for 7 liters on 10-01-14 and another 10 liters on 10-06-14. The further marked distension of abdomen is worsening the doxil skin reaction and making care of the areas more difficult.  Patient and family report marked discomfort from the ascites, with difficulty breathing and difficulty taking po's. She has not had nausea or vomiting. Bowels are moving. She continues lovenox for past DVT related to [facility], particularly as she had another PAC placed when doxil started; no bleeding. She does not have home O2 available, which would probably be helpful on prn basis. No fever.   PAC in  [place] Patient developed excessive uterine bleeding. Dilatation and curettage with hysteroscopy documented grade 1 endometrioid endometrial adenocarcinoma. Due to morbid obesity Mirena IUD was placed along with Aygestin. Patient had a dramatic directed weight loss with nutritional support she went from 523 pounds to 359 pounds in 10/2009. On 12/27/2009 she underwent a robotic-assisted laparoscopic hysterectomy, bilateral salpingo-oophorectomy, and mini laparotomy through the umbilical port with morcellation of uterus within about the delivery of the uterus. The final pathology revealed an endometrial adenocarcinoma grade 2 with invasion limited to 1 mm of the myometrium.  In 09/2012 when she developed right upper quadrant pain, presumed cholelithiasis, with laparoscopic evaluation finding peritoneal carcinomatosis and ascites. Omental biopsies had  metastatic adenocarcinoma consistent with recurrent endometrial carcinoma. She had 12 cycles taxol carboplatin from 11-04-12 thru 07-24-13 with neulasta day 2. CT AP after cycle 12 showed stable to minimally improved disease. With progressive neuropathy, she continued single agent carboplatin, cycle 9 on 02-11-14. CT AP showed basically stable disease compared with 07-2013, with small volume disease at liver capsule, omentum/peritoneum, adjacent to sigmoid colon and loculated fluid left abdomen.  She initially required multiple paracenteses for ascites, none needed since Aug 2014. Patient continued carboplatin thru 02-10-14. CT AP 03-10-14 had fairly stable disease; gyn oncology recommended continuing treatment with another regimen. Oral etoposide was chosen to avoid another central line for present, cycle 1 begun 04-27-14, x 4 cycles, completed 08-17-14. PET 08-30-14 had scattered hypermetabolic nodules in omentum and soft tissue, progressed compared with 02-2014. She had PAC placed by IR on 09-14-14 and cycle 1 doxil 09-23-14. She had skin reaction beneath panus and was markedly symptomatic with massive ascites in next 2 weeks.     Review of systems as above, also: No new or different pain. No LE swelling. No significant cough. No problems with PAC. No hand foot syndrome Remainder of 10 point Review of Systems negative.  Objective:  Vital signs in last 24 hours: Weight 512 lbs, up from 484 on 09-09-14. BP 118/79, HR 97 regular, resp 18, temp 97.6. Looks uncomfortable in WC, barely able to reposition herself, nailbeds slightly cyanotic tho O2 sat 96% on RA. She is unable to stand for better exam of skin areas. Alert, oriented and appropriate  HEENT:PERRL, sclerae not icteric. Oral mucosa moist without lesions, posterior pharynx clear.  Neck supple. No JVD.  Lymphatics:no cervical,supraclavicular adenopathy Resp: diminished BS lower fields bilaterally without wheezes or rales.  Cardio: tachy, regular rate  and rhythm. No gallop. GI:  abdomen massively obese and distended, quiet, not tender. . Musculoskeletal/ Extremities: venous stasis changes lower legs without pitting edema, cords, tenderness Neuro: speech fluent and appropriate. No focal deficits obvious Skin without rash, ecchymosis, petechiae. Difficult to visualize areas of skin breakdown upper thighs and inguinal areas, but some superficial desquamation and erythema apparent. Right lower quadrant panus slight erythema, no heat or tenderness Portacath-without erythema or tenderness  Lab Results:  Results for orders placed or performed in visit on 10/07/14  CBC with Differential  Result Value Ref Range   WBC 7.3 3.9 - 10.3 10e3/uL   NEUT# 5.4 1.5 - 6.5 10e3/uL   HGB 12.5 11.6 - 15.9 g/dL   HCT 39.3 34.8 - 46.6 %   Platelets 303 145 - 400 10e3/uL   MCV 99.7 79.5 - 101.0 fL   MCH 31.7 25.1 - 34.0 pg   MCHC 31.8 31.5 - 36.0 g/dL   RBC 3.94 3.70 - 5.45 10e6/uL   RDW 16.4 (H) 11.2 - 14.5 %   lymph# 1.2 0.9 - 3.3 10e3/uL   MONO# 0.5 0.1 - 0.9 10e3/uL   Eosinophils Absolute 0.2 0.0 - 0.5 10e3/uL   Basophils Absolute 0.0 0.0 - 0.1 10e3/uL   NEUT% 73.2 38.4 - 76.8 %   LYMPH% 16.9 14.0 - 49.7 %   MONO% 7.1 0.0 - 14.0 %   EOS% 2.5 0.0 - 7.0 %   BASO% 0.3 0.0 - 2.0 %    CMET has creat 0.8, BUN 6.6, T prot 5.8, alb 1.9 Studies/Results:  US Paracentesis  10/06/2014   INDICATION: Endometrial cancer, recurrent ascites and request for paracentesis.  EXAM: ULTRASOUND-GUIDED PARACENTESIS  COMPARISON:  Paracentesis 10/01/14.  MEDICATIONS: None.  COMPLICATIONS: None immediate  TECHNIQUE: Informed written consent was obtained from the patient after a discussion of the risks, benefits and alternatives to treatment. A timeout was performed prior to the initiation of the procedure.  Initial ultrasound scanning demonstrates a large amount of ascites within the right upper abdominal quadrant. The right upper abdomen was prepped and draped in the usual  sterile fashion. 1% lidocaine was used for local anesthesia.  Under direct ultrasound guidance, a 19 gauge, 15-cm, Yueh catheter was introduced. An ultrasound image was saved for documentation purposed. The paracentesis was performed. The catheter was removed and a dressing was applied. The patient tolerated the procedure well without immediate post procedural complication.  FINDINGS: A total of approximately 10 liters of serous fluid was removed.  IMPRESSION: Successful ultrasound-guided paracentesis yielding 10 liters of peritoneal fluid.  Read By:  Tsosie Billing PA-C   Electronically Signed   By: Marybelle Killings M.D.   On: 10/06/2014 15:42    Medications: I have reviewed the patient's current medications.  DISCUSSION: Mother and aunt are in tears as they tell me how difficult situation has become with symptoms from ascites and  "we have done all we can and the skin is getting worse".  I have told them that I believe we have reached maximum benefit from chemotherapy, as further doxil will not be manageable and we do not have other reasonable options for systemic treatment now. We all agree that goal has to be comfort and that assistance is needed at home despite incredible care by family thru all of her illness to now. I have been very clear that further chemo will not be of benefit. Kamaiya agrees with Hospice referral, as do her mother and aunt. They have not completed Advance Directives, but understand that resuscitation and life support would  not improve the underlying cancer or her quality of life. They understand that I will be in close contact with Hospice and that I will let Dr Skeet Latch know situation. I am glad to see Lauraine back at this office either on scheduled basis if she is able to come, or as needed. Referral called now to [facility], mentioning need for prn home O2 and skin care. We have discussed peritoneal catheter to manage ascites at home, which cannot be placed today as she is on LMW  heparin, but will be scheduled for next week. As she likely will not be able to manage thru weekend without another paracentesis, radiology at [facility] has kindly worked her in today, another 10 liters drained after my visit.     Assessment/Plan:  1.Endometrial carcinoma IA grade 2 at diagnosis March 2009, treated nonsurgically until weight loss of ~ 165 lbs allowed hysterectomy BSO 12-2009. 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 She has progressed on oral etoposide used 04-2014 thru 07-2014. First doxil 09-23-14 not tolerated due to skin reaction under panus and will be discontinued. Massive ascites as noted, will paracentese again today and get peritoneal drain early next week after holding LMW heparin. Hospice referral for comfort interventions now. 2.morbid obesity: weight rapidly increasing with ascites.   3.PAC placed for doxil. WIll need to be flushed by hospice every 6-8 weeks if not otherwise used. 4.skin breakdown upper thighs and inguinal areas since doxil,, beneath panus and more difficult to manage with massive ascites. Family will try to keep dry and will use zinc oxide until other skin care by Hospice team. 5.past C diff 6.DVT with previous PAC, arixtra continued with immobility and active gyn cancer.  7.GERD controlled on protonix and carafate 8. Cholelithiasis by CT 9.compression fractures T 10-31 Jan 2013 after fall, post kyphoplasties. Degenerative arthritis. Uses occasional percocet 10 has not completed advance directives but does not want resuscitation or life support, family understands.    Family express appreciation for care. Time spent 25 min including >50% counseling and coordination of care. All questions addressed and they know that they can call at any time if needed.    Taziah Difatta P,  MD   10/07/2014, 11:58 AM

## 2014-10-07 NOTE — Patient Instructions (Signed)
No lovenox day before or day of the peritoneal drain placement

## 2014-10-08 ENCOUNTER — Other Ambulatory Visit: Payer: Self-pay | Admitting: Radiology

## 2014-10-08 ENCOUNTER — Other Ambulatory Visit (HOSPITAL_COMMUNITY): Payer: BLUE CROSS/BLUE SHIELD

## 2014-10-08 ENCOUNTER — Telehealth: Payer: Self-pay

## 2014-10-08 NOTE — Telephone Encounter (Signed)
Faed order form with insurance information to [facility] supply.  Sent copies of the orders to HIM to be scanned into the patint's EMR.

## 2014-10-10 ENCOUNTER — Other Ambulatory Visit: Payer: Self-pay | Admitting: Oncology

## 2014-10-11 ENCOUNTER — Other Ambulatory Visit: Payer: Self-pay | Admitting: Oncology

## 2014-10-11 ENCOUNTER — Ambulatory Visit (HOSPITAL_COMMUNITY)
Admission: RE | Admit: 2014-10-11 | Discharge: 2014-10-11 | Disposition: A | Discharge status: Home / Self Care | Payer: BLUE CROSS/BLUE SHIELD | Source: Ambulatory Visit | Attending: Oncology | Admitting: Oncology

## 2014-10-11 ENCOUNTER — Encounter (HOSPITAL_COMMUNITY): Payer: Self-pay

## 2014-10-11 DIAGNOSIS — C541 Malignant neoplasm of endometrium: Secondary | ICD-10-CM | POA: Diagnosis not present

## 2014-10-11 DIAGNOSIS — Z6841 Body Mass Index (BMI) 40.0 and over, adult: Secondary | ICD-10-CM | POA: Insufficient documentation

## 2014-10-11 DIAGNOSIS — R180 Malignant ascites: Secondary | ICD-10-CM | POA: Insufficient documentation

## 2014-10-11 DIAGNOSIS — Z86718 Personal history of other venous thrombosis and embolism: Secondary | ICD-10-CM | POA: Diagnosis not present

## 2014-10-11 LAB — CBC WITH DIFFERENTIAL/PLATELET
BASOS ABS: 0 10*3/uL (ref 0.0–0.1)
Basophils Relative: 0 % (ref 0–1)
Eosinophils Absolute: 0.1 10*3/uL (ref 0.0–0.7)
Eosinophils Relative: 2 % (ref 0–5)
HEMATOCRIT: 40.8 % (ref 36.0–46.0)
Hemoglobin: 12.8 g/dL (ref 12.0–15.0)
Lymphocytes Relative: 16 % (ref 12–46)
Lymphs Abs: 1.2 10*3/uL (ref 0.7–4.0)
MCH: 31 pg (ref 26.0–34.0)
MCHC: 31.4 g/dL (ref 30.0–36.0)
MCV: 98.8 fL (ref 78.0–100.0)
Monocytes Absolute: 0.9 10*3/uL (ref 0.1–1.0)
Monocytes Relative: 12 % (ref 3–12)
Neutro Abs: 5.4 10*3/uL (ref 1.7–7.7)
Neutrophils Relative %: 70 % (ref 43–77)
PLATELETS: 412 10*3/uL — AB (ref 150–400)
RBC: 4.13 MIL/uL (ref 3.87–5.11)
RDW: 16.2 % — ABNORMAL HIGH (ref 11.5–15.5)
WBC: 7.7 10*3/uL (ref 4.0–10.5)

## 2014-10-11 LAB — PROTIME-INR
INR: 1.08 (ref 0.00–1.49)
Prothrombin Time: 14.2 seconds (ref 11.6–15.2)

## 2014-10-11 MED ORDER — MIDAZOLAM HCL 2 MG/2ML IJ SOLN
INTRAMUSCULAR | Status: AC
  Filled 2014-10-11: qty 2

## 2014-10-11 MED ORDER — DEXTROSE 5 % IV SOLN
3.0000 g | Freq: Once | INTRAVENOUS | Status: AC
  Administered 2014-10-11: 3 g via INTRAVENOUS
  Filled 2014-10-11: qty 3000

## 2014-10-11 MED ORDER — SODIUM CHLORIDE 0.9 % IV SOLN
INTRAVENOUS | Status: DC
  Administered 2014-10-11: 13:00:00 via INTRAVENOUS

## 2014-10-11 MED ORDER — HEPARIN SOD (PORK) LOCK FLUSH 100 UNIT/ML IV SOLN
500.0000 [IU] | INTRAVENOUS | Status: AC | PRN
  Administered 2014-10-11: 500 [IU]
  Filled 2014-10-11: qty 5

## 2014-10-11 MED ORDER — FENTANYL CITRATE (PF) 100 MCG/2ML IJ SOLN
INTRAMUSCULAR | Status: AC | PRN
  Administered 2014-10-11 (×2): 25 ug via INTRAVENOUS

## 2014-10-11 MED ORDER — LIDOCAINE-EPINEPHRINE 2 %-1:100000 IJ SOLN
INTRAMUSCULAR | Status: AC
  Filled 2014-10-11: qty 1

## 2014-10-11 MED ORDER — MIDAZOLAM HCL 2 MG/2ML IJ SOLN
INTRAMUSCULAR | Status: AC | PRN
  Administered 2014-10-11: 1 mg via INTRAVENOUS
  Administered 2014-10-11: 0.5 mg via INTRAVENOUS

## 2014-10-11 MED ORDER — FENTANYL CITRATE (PF) 100 MCG/2ML IJ SOLN
INTRAMUSCULAR | Status: AC
  Filled 2014-10-11: qty 2

## 2014-10-11 NOTE — H&P (Signed)
    HPI: Patient is here today for image guided tunneled peritoneal catheter placement for recurrent malignant ascites, now on hospice. She denies any fever or chills.  The patient has had a H&P performed within the last 30 days, all history, medications, and exam have been reviewed. The patient denies any interval changes since the H&P.  Vital Signs: BP 103/48 mmHg  Pulse 103  Temp(Src) 97.6 F (36.4 C)  Resp 18  SpO2 93%  Physical Exam  Constitutional: She is oriented to person, place, and time. No distress.  HENT:  Head: Normocephalic and atraumatic.  Cardiovascular: Exam reveals no gallop and no friction rub.   No murmur heard. Tachycardic  Pulmonary/Chest: Effort normal and breath sounds normal. No respiratory distress. She has no wheezes. She has no rales.  Abdominal: She exhibits distension.  Neurological: She is alert and oriented to person, place, and time.  Skin: She is not diaphoretic.   Mallampati Score:  MD Evaluation Airway: WNL Heart: WNL Abdomen: WNL Chest/ Lungs: WNL ASA  Classification: 3 Mallampati/Airway Score: Two  Labs:  CBC:  Recent Labs  09/14/14 1020 09/23/14 0922 10/07/14 1135 10/11/14 1315  WBC 8.6 9.5 7.3 7.7  HGB 13.7 13.1 12.5 12.8  HCT 42.8 40.8 39.3 40.8  PLT 209 253 303 412*    COAGS:  Recent Labs  09/14/14 1020 10/11/14 1315  INR 1.10 1.08    BMP:  Recent Labs  02/26/14 0520 02/27/14 0600 02/28/14 0605 03/01/14 0500  08/30/14 0916 09/09/14 0858 09/23/14 0922 10/07/14 1135  NA 134* 138 137 139  < > 142 141 140 139  K 2.9* 3.1* 3.3* 3.6*  < > 4.1 3.8 3.7 3.8  CL 94* 98 98 99  --   --   --   --   --   CO2 27 27 27 28   < > 28 27 25  31*  GLUCOSE 122* 120* 121* 104*  < > 114 117 127 93  BUN 11 9 9 9   < > 9.0 12.1 10.3 6.6*  CALCIUM 8.2* 8.5 8.5 8.8  < > 9.2 8.7 8.9 8.7  CREATININE 0.73 0.64 0.64 0.69  < > 0.8 0.8 0.8 0.8  GFRNONAA >90 >90 >90 >90  --   --   --   --   --   GFRAA >90 >90 >90 >90  --   --   --    --   --   < > = values in this interval not displayed.  LIVER FUNCTION TESTS:  Recent Labs  08/30/14 0916 09/09/14 0858 09/23/14 0922 10/07/14 1135  BILITOT 0.50 0.50 0.45 0.54  AST 27 24 25 24   ALT 26 20 15 16   ALKPHOS 66 63 57 62  PROT 6.4 6.4 6.3* 5.8*  ALBUMIN 3.1* 2.9* 2.6* 1.9*    Assessment/Plan:  Advanced endometrial carcinoma Malignant ascites s/p multiple large volume paracentesis, now on hospice  Request for image guided tunneled peritoneal catheter placement with sedation The patient has been NPO, no blood thinners taken, labs and vitals have been reviewed. Risks and Benefits discussed with the patient including, but not limited to bleeding or infection.  All of the patient's questions were answered, patient is agreeable to proceed. Consent signed and in chart. History of DVT with previous PAC- on Arixtra, Held since 6/16  Morbid obesity    Signed: Hedy Jacob 10/11/2014, 1:55 PM

## 2014-10-11 NOTE — Procedures (Signed)
Successful [place] and Fluoro guided tunneled peritoneal catheter placement into right lower abdomen. No immediate post procedural complications.

## 2014-10-11 NOTE — Discharge Instructions (Signed)
Tunneled Catheter Insertion, Care After °Refer to this sheet in the next few weeks. These instructions provide you with information on caring for yourself after your procedure. Your caregiver may also give you more specific instructions. Your treatment has been planned according to current medical practices, but problems sometimes occur. Call your caregiver if you have any problems or questions after your procedure.  °HOME CARE INSTRUCTIONS °· Rest at home the day of the procedure. You will likely be able to return to normal activities the following day. °· Follow your caregiver's specific instructions for the type of device that you have. °· Only take over-the-counter or prescription medicines as directed by your caregiver. °· Keep the insertion site of the catheter clean and dry at all times. °¨ Change the bandages (dressings) over the catheter site as directed by your caregiver. °¨ Wash the area around the catheter site during each dressing change. Sponge bathe the area using a germ-killing (antiseptic) solution as directed by your caregiver. °¨ Look for redness or swelling at the insertion site during each dressing change. °· Apply an antibiotic ointment as directed by your caregiver. °· Flush your catheter as directed to keep it from becoming clogged. °· Always wash your hands thoroughly before changing dressings or flushing the catheter. °· Do not let air enter the catheter. °¨ Never open the cap at the catheter tip. °¨ Always make sure there is no air in the syringe or in the tubing for infusions.    °· Do not lift anything heavy. °· Do not drive until your caregiver approves. °· Do not shower or bathe until your caregiver approves. When you shower or bathe, place a piece of plastic wrap over the catheter site. Do not allow the catheter site or the dressing to get wet. If taking a bath, do not allow the catheter to get submerged in the water. °If the catheter was inserted through an arm vein:  °· Avoid  wearing tight clothes or jewelry on the arm that has the catheter.   °· Do not sleep with your head on the arm that has the catheter.   °· Do not allow use of a blood pressure cuff on the arm that has the catheter.   °· Do not let anyone draw blood from the arm that has the catheter, except through the catheter itself. °SEEK MEDICAL CARE IF: °· You have bleeding at the insertion site of the catheter.   °· You feel weak or nauseous.   °· Your catheter is not working properly.   °· You have redness, pain, swelling, and warmth at the insertion site.   °· You notice fluid draining from the insertion site.   °SEEK IMMEDIATE MEDICAL CARE IF: °· Your catheter breaks or has a hole in it.   °· Your catheter comes loose or gets pulled completely out. If this happens, hold firm pressure over the area with your hand or a clean cloth.   °· You have a fever. °· You have chills.   °· Your catheter becomes totally blocked.   °· You have swelling in your arm, shoulder, neck, or face.   °· You have bleeding from the insertion site that does not stop.   °· You develop chest pain or have trouble breathing.   °· You feel dizzy or faint.   °MAKE SURE YOU: °· Understand these instructions. °· Will watch your condition. °· Will get help right away if you are not doing well or get worse. °Document Released: 03/26/2012 Document Revised: 12/10/2012 Document Reviewed: 03/26/2012 °ExitCare® Patient Information ©2015 [employer]. This information is not intended to replace advice   given to you by your health care provider. Make sure you discuss any questions you have with your health care provider. Conscious Sedation Sedation is the use of medicines to promote relaxation and relieve discomfort and anxiety. Conscious sedation is a type of sedation. Under conscious sedation you are less alert than normal but are still able to respond to instructions or stimulation. Conscious sedation is used during short medical and dental procedures. It is  milder than deep sedation or general anesthesia and allows you to return to your regular activities sooner.  LET [facility] CARE PROVIDER KNOW ABOUT:   Any allergies you have.  All medicines you are taking, including vitamins, herbs, eye drops, creams, and over-the-counter medicines.  Use of steroids (by mouth or creams).  Previous problems you or members of your family have had with the use of anesthetics.  Any blood disorders you have.  Previous surgeries you have had.  Medical conditions you have.  Possibility of pregnancy, if this applies.  Use of cigarettes, alcohol, or illegal drugs. RISKS AND COMPLICATIONS Generally, this is a safe procedure. However, as with any procedure, problems can occur. Possible problems include:  Oversedation.  Trouble breathing on your own. You may need to have a breathing tube until you are awake and breathing on your own.  Allergic reaction to any of the medicines used for the procedure. BEFORE THE PROCEDURE  You may have blood tests done. These tests can help show how well your kidneys and liver are working. They can also show how well your blood clots.  A physical exam will be done.  Only take medicines as directed by your health care provider. You may need to stop taking medicines (such as blood thinners, aspirin, or nonsteroidal anti-inflammatory drugs) before the procedure.   Do not eat or drink at least 6 hours before the procedure or as directed by your health care provider.  Arrange for a responsible adult, family member, or friend to take you home after the procedure. He or she should stay with you for at least 24 hours after the procedure, until the medicine has worn off. PROCEDURE   An intravenous (IV) catheter will be inserted into one of your veins. Medicine will be able to flow directly into your body through this catheter. You may be given medicine through this tube to help prevent pain and help you relax.  The medical  or dental procedure will be done. AFTER THE PROCEDURE  You will stay in a recovery area until the medicine has worn off. Your blood pressure and pulse will be checked.   Depending on the procedure you had, you may be allowed to go home when you can tolerate liquids and your pain is under control. Document Released: 01/02/2001 Document Revised: 04/14/2013 Document Reviewed: 12/15/2012 [facility] Patient Information 2015 [place], [place]. This information is not intended to replace advice given to you by your health care provider. Make sure you discuss any questions you have with your health care provider.

## 2014-10-12 ENCOUNTER — Telehealth: Payer: Self-pay

## 2014-10-12 NOTE — Telephone Encounter (Signed)
-----   Message from Gordy Levan, MD sent at 10/10/2014  9:45 PM EDT ----- She is on schedule to see me on 6-23, which I think is an old apt. Please check on her by phone this week- if ok with peritoneal drain and Hospice, can cancel 9-82 with me, tho certainly I can still see her that day if they want.  She also has apt with me 7-14, which is the one I set up at visit last week  thanks

## 2014-10-12 NOTE — Telephone Encounter (Signed)
Spoke with Mrs. Stoutenburg.  The Hospice nurse is coming out to the house tomorrow.  [name] is doing fine with drain.  9.2 liters taken off yesterday after drain inserted. She is fine with cancelling the appointment on 10-14-14 with Dr. Marko Plume and keeping 11-04-14 appointment as scheduled. Cancelled 10-14-14 appointments.

## 2014-10-13 ENCOUNTER — Telehealth: Payer: Self-pay | Admitting: Nurse Practitioner

## 2014-10-13 NOTE — Telephone Encounter (Signed)
-----   Message from Gordy Levan, MD sent at 10/13/2014  2:13 PM EDT ----- Regarding: RE: pleurex drainage frequency This is peritoneal drain.  Please drain ascites every 1-3 days as needed  thanks ----- Message -----    From: Alla Feeling, RN    Sent: 10/13/2014   2:03 PM      To: Lennis Marion Downer, MD Subject: pleurex drainage frequency                     Hospice nurse calling to get clarification on pleurex drainage frequency. Candita states you told her to drain "whenever I am uncomfortable." Will you please clarify and I will fax her your response to this note so they have documentation.   Thanks, Regan Rakers, RN

## 2014-10-13 NOTE — Telephone Encounter (Signed)
RN returned call to [place], [facility] RN to inform her of Dr. Mariana Kaufman order to drain ascites via peritoneal drain q 1-3 days prn.  She reports minimal leaking of fluid from valve on drain, so she replaced valve and will assess today when she visits the patient.  I will defer to IR on whether this is acceptable, and told Clifton James to call [employer] with questions regarding drain. She verbalizes understanding.

## 2014-10-14 ENCOUNTER — Other Ambulatory Visit: Payer: BLUE CROSS/BLUE SHIELD

## 2014-10-14 ENCOUNTER — Ambulatory Visit: Payer: BLUE CROSS/BLUE SHIELD | Admitting: Oncology

## 2014-10-14 ENCOUNTER — Telehealth: Payer: Self-pay | Admitting: *Deleted

## 2014-10-14 ENCOUNTER — Ambulatory Visit: Payer: BLUE CROSS/BLUE SHIELD

## 2014-10-14 NOTE — Telephone Encounter (Signed)
Received vm call from [name]/Hospice/[place] stating they received orders yes from RN for pleurx drain  But wants to know how much is safe to pull off at one time.  This message will be routed to Dr Edwyna Shell & her RN

## 2014-10-14 NOTE — Telephone Encounter (Signed)
Re triage note, Hospice asking volume of paracentesis She has tolerated 5-7 liters at a time when done by radiology, so I would say up to 4-6 liters fine as long as BP ok. thanks

## 2014-10-15 ENCOUNTER — Telehealth: Payer: Self-pay

## 2014-10-15 DIAGNOSIS — C541 Malignant neoplasm of endometrium: Secondary | ICD-10-CM

## 2014-10-15 MED ORDER — FLUCONAZOLE 100 MG PO TABS: 100.0000 mg | ORAL_TABLET | Freq: Every day | ORAL | Status: AC

## 2014-10-15 NOTE — Telephone Encounter (Signed)
Told [name] that Dr. Marko Plume ordered a 7 day course of diflucan for [name] to see if it would help decrease yeast infection and or control odor. Mother verbalized understanding.

## 2014-10-15 NOTE — Telephone Encounter (Signed)
Told [name] that Dr. Marko Plume said that 4-6 liters at a time can be drawn off if BP fine. Clifton James stated that ~1-1-1/2 liters is being drawn off daily.

## 2014-10-15 NOTE — Telephone Encounter (Signed)
Clifton James states that there is a white discharge from skin breakdown in abdominal fold.  It smells like a yeast infection.  Aantifungal powder is being used on skin along with ABD Pads.

## 2014-10-18 ENCOUNTER — Telehealth: Payer: Self-pay

## 2014-10-18 DIAGNOSIS — C541 Malignant neoplasm of endometrium: Secondary | ICD-10-CM

## 2014-10-18 MED ORDER — FUROSEMIDE 20 MG PO TABS: ORAL_TABLET | ORAL | Status: AC

## 2014-10-18 NOTE — Telephone Encounter (Signed)
Told [name] the information noted below about the Lasix.  She verbalized understanding. Prescription sent to [facility].

## 2014-10-18 NOTE — Telephone Encounter (Signed)
Daily PLEUR-X draining is yielding 1-2 liters. [name] continues with swelling and tightness in left arm and leg.  Would Dr. Marko Plume consider some lasix for comfort.

## 2014-10-18 NOTE — Telephone Encounter (Signed)
Re Hospice report of arm and leg swelling  10-18-14 and their request for lasix  OK to try lasix 20 mg q AM x 2 as long as BP ok - lasix may not work as albumin is low, so if no improvement with a couple of doses would not continue that  She should still be on arixtra  thanks

## 2014-10-19 ENCOUNTER — Other Ambulatory Visit: Payer: Self-pay

## 2014-10-19 DIAGNOSIS — C541 Malignant neoplasm of endometrium: Secondary | ICD-10-CM

## 2014-10-19 MED ORDER — PROCHLORPERAZINE MALEATE 10 MG PO TABS: 10.0000 mg | ORAL_TABLET | Freq: Four times a day (QID) | ORAL | Status: AC | PRN

## 2014-10-20 ENCOUNTER — Telehealth: Payer: Self-pay

## 2014-10-20 NOTE — Telephone Encounter (Signed)
[name]  left a message stating that in team conference Dr. Lyman Speller recommended that [name] be seen by Dr. Marko Plume to evaluate the pleurX catheter as it should not be leaking around the tube with draining 1-2 liters of fluid a day. LM for [facility] to call back to see about setting up an evaluation of the drain.  No respose from IR as of 1700 today.  No order put in for evaluation as not sure of what order to be placed if they will evaluate the drain.

## 2014-10-21 ENCOUNTER — Telehealth: Payer: Self-pay | Admitting: Nurse Practitioner

## 2014-10-21 NOTE — Telephone Encounter (Signed)
Per Dr. Marko Plume, [name] notified that IR had to drain 26 liters over 6 days and 1-2 liters may not be enough if there is still leakage. Suggested per MD to drain up to 6 liters daily for a few days and update [place] on how this works. [name] verbalizes understanding and notes that "they drain until it stops, which is usually around 1 or 2 liters."  Informed that if drainage increase prevents leakage, catheter works, and site is WNL, no need for IR or MD visit. She will try turning and repositioning while draining to see if amount increases and will let us know how this works. She is also requesting verbal DNR order per Dr. Marko Plume per discussion with Butch Penny and SW. Conveyed verbal order for DNR per Dr. Marko Plume.

## 2014-10-21 NOTE — Telephone Encounter (Signed)
-----   Message from Gordy Levan, MD sent at 10/21/2014  7:45 AM EDT ----- Re message from [facility] 6-29 PM  Baruch Merl, RN at 10/20/2014 8:02 PM "[name] left a message stating that in team conference Dr. Lyman Speller recommended that [name] be seen by Dr. Marko Plume to evaluate the pleurX catheter as it should not be leaking around the tube with draining 1-2 liters of fluid a day. LM for [name] to call back to see about setting up an evaluation of the drain. No respose from Marquez as of 1700 today. No order put in for evaluation as not sure of what order to be placed if they will evaluate the drain."   RN -- please let hospice know that Marquez had to drain 7 liters then 10 liters then 10 liters in 6 days (=27 liters in 6 days) prior to the peritoneal catheter. I doubt 1-2 liters a day is enough if there is leakage. Suggest draining up to 6 liters daily next couple of days to see if that helps. If removing increased volume helps + catheter is working + site ok, doubt more that Marquez would do to check catheter. Hospice can let us know how this goes  Thank you

## 2014-10-26 ENCOUNTER — Telehealth: Payer: Self-pay | Admitting: Oncology

## 2014-10-26 NOTE — Telephone Encounter (Signed)
[name] called in to cancel the 7/14 appointment as they have called in hospice for her and she doesn't feel like she wants to make her come in   [place]

## 2014-10-27 ENCOUNTER — Telehealth: Payer: Self-pay

## 2014-10-27 NOTE — Telephone Encounter (Signed)
[name] stated that the 20 mg of lasix x 2 days did not help reduce the swelling in upper arm and leg.  Discussed rational  With [name] regarding 3rd spacing of fluid and low albumin.  [name] verbalized understanding. BP remined statble with lasix trial. [name] and hospice SSW. Will visit [name] on 10-28-14 to discuss changes that are and will occur as she declines.

## 2014-11-04 ENCOUNTER — Other Ambulatory Visit: Payer: BLUE CROSS/BLUE SHIELD

## 2014-11-04 ENCOUNTER — Ambulatory Visit: Payer: BLUE CROSS/BLUE SHIELD | Admitting: Oncology

## 2014-11-11 ENCOUNTER — Encounter: Payer: Self-pay | Admitting: Oncology

## 2014-11-11 NOTE — Progress Notes (Signed)
I placed nctracks from for prior auth for arixtra on desk of nurse for dr. Marko Plume

## 2014-11-12 ENCOUNTER — Telehealth: Payer: Self-pay

## 2014-11-12 DIAGNOSIS — I82629 Acute embolism and thrombosis of deep veins of unspecified upper extremity: Secondary | ICD-10-CM

## 2014-11-12 MED ORDER — ENOXAPARIN SODIUM 60 MG/0.6ML ~~LOC~~ SOLN: 60.0000 mg | SUBCUTANEOUS | Status: AC

## 2014-11-12 NOTE — Telephone Encounter (Signed)
[name] that Dr. Marko Plume is fine with Dr. Lyman Speller writing the order for [name] and managing symptoms.  [name] verbalized understanding.

## 2014-11-12 NOTE — Telephone Encounter (Signed)
Explained the dosing of the Lovenox as noted below by Dr. Marko Plume to the Pharmacist at [facility] as well as Ms. Abed's mother. Prescription called in and Ms. Helfman's mother will pick up lovenox on Monday 11-15-14.

## 2014-11-12 NOTE — Telephone Encounter (Signed)
Faxed signed orders dated 11-12-14 to Hospice.  Sent a copy to be scanned into patient's EMR.

## 2014-11-12 NOTE — Telephone Encounter (Signed)
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 That PAC is out.   She has been on full dose anticoagulation since 03-2013, continued particularly with active gyn cancer and immobility, but reasonable now to decrease this to prophylactic dosing. Last weight 512 lbs.   Due to morbid obesity, recommendation is to increase usual prophylactic dose by ~ 30%, which would be ~ 52 mg instead of standard prophylactic dose of 40 mg. As lovenox comes in 40 mg and 60 mg syringes, will give 60 mg once daily.   If bleeding or if she is imminently terminal, would DC this prophylactic lovenox.   OK for lovenox 60 mg QS 2 wks or 4 wks with refills, whichever most appropriate.   Godfrey Pick, MD

## 2014-11-12 NOTE — Telephone Encounter (Signed)
[name] states that [name] is beginning to experience some SOB with abdominal acuities. She would like to have some [name] on hand to help [name] be more comfortable as SOB increases. Dr. Lyman Speller can write order and manage symptoms if Dr. Marko Plume is in agreement.

## 2014-11-15 ENCOUNTER — Telehealth: Payer: Self-pay

## 2014-11-15 NOTE — Telephone Encounter (Signed)
[name] stated that the fluid is accumulating faster in [name]'s abdomen.  The PleurX  Cath is being drained daily.   Dr. Lyman Speller ordered Lasix 80 mg daily  X 2 days on 11-12-14.  This is helping to decrease the fluid.  BP has been staying 414 systolic.  The [name] is also helping with SOB.  A foley is going to be placed tomorrow at visit.  [name] cannot getup to the commode anymore.

## 2014-11-17 ENCOUNTER — Telehealth: Payer: Self-pay | Admitting: *Deleted

## 2014-11-17 NOTE — Telephone Encounter (Signed)
Received call from Pt's mother Renezmae Canlas requesting that follow up appointment on 12/02/2014 be canceled. Pt's mother stated " Gerldine is under hospice care now she can not even walk". The appointment was canceled and Pt's mother was advised to call [facility] if she had any questions or concerns

## 2014-11-21 IMAGING — US US ABDOMEN LIMITED
1 series · 8 of 8 positions shown · non-contrast
Comparison: None.

CLINICAL DATA: Ascites

LIMITED ABDOMINAL ULTRASOUND

[Series 1: us abdomen limited · 0.49mm/px · 8 of 8 slices shown]
[im 1/8]
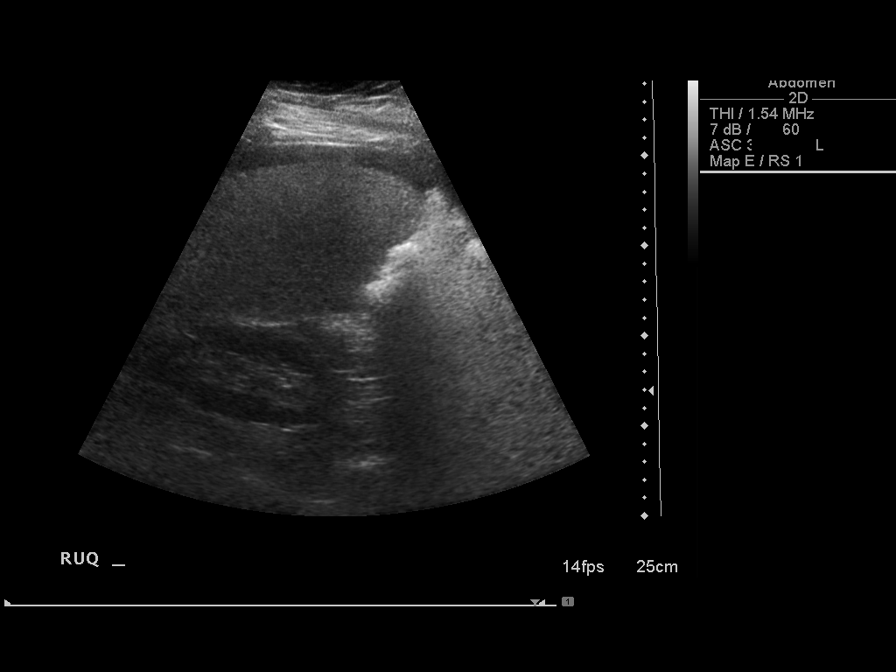
[im 2/8]
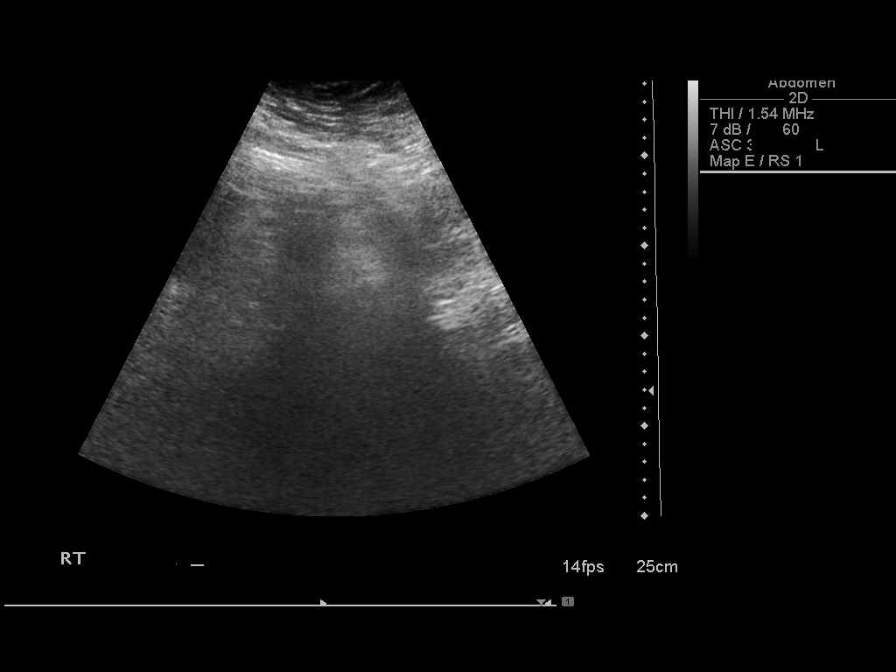
[im 3/8]
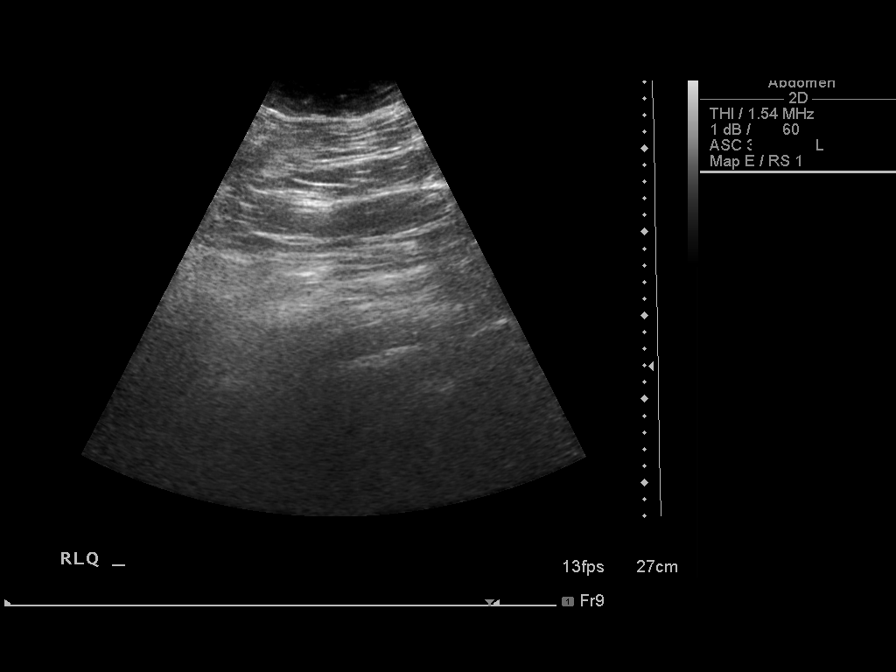
[im 4/8]
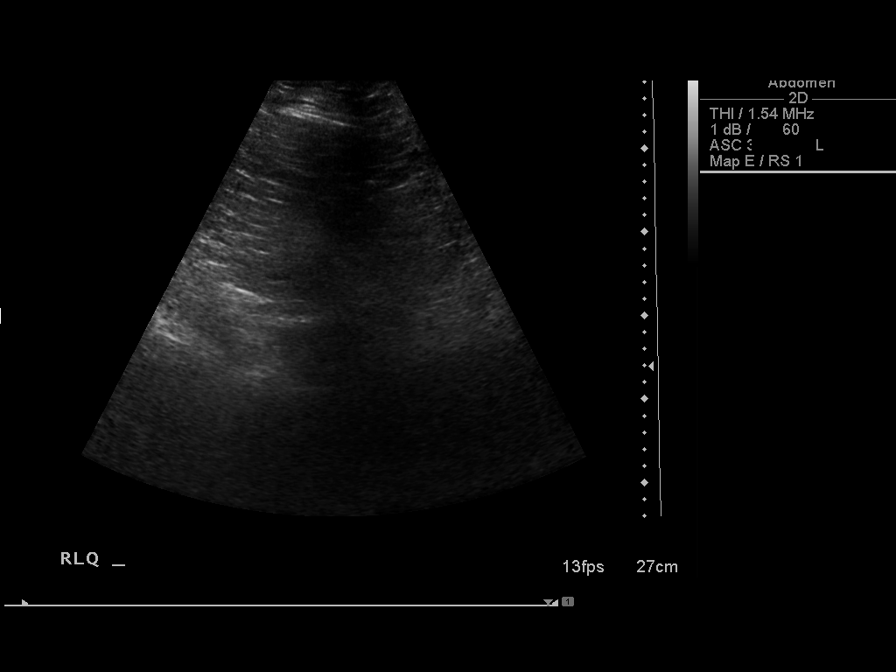
[im 5/8]
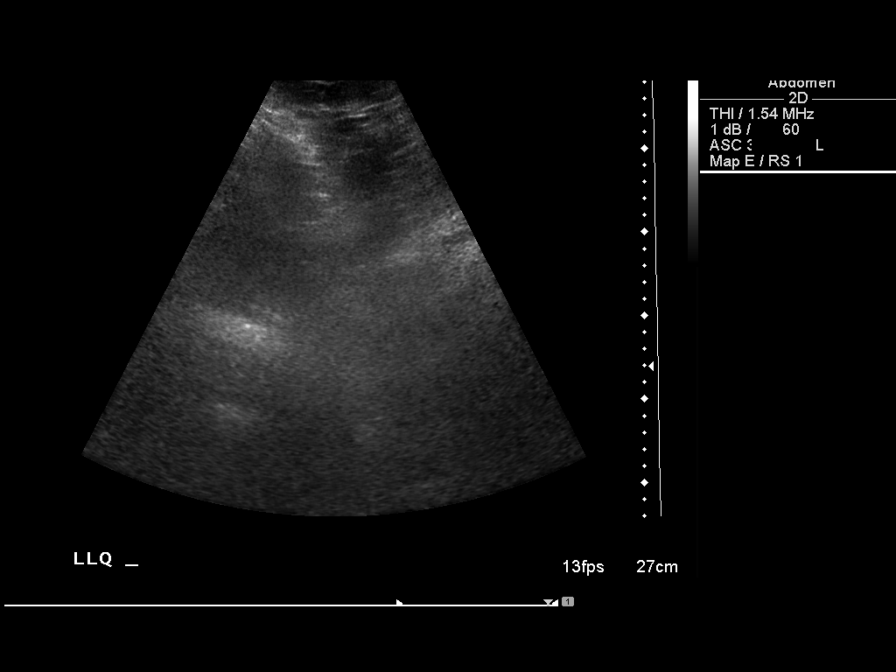
[im 6/8]
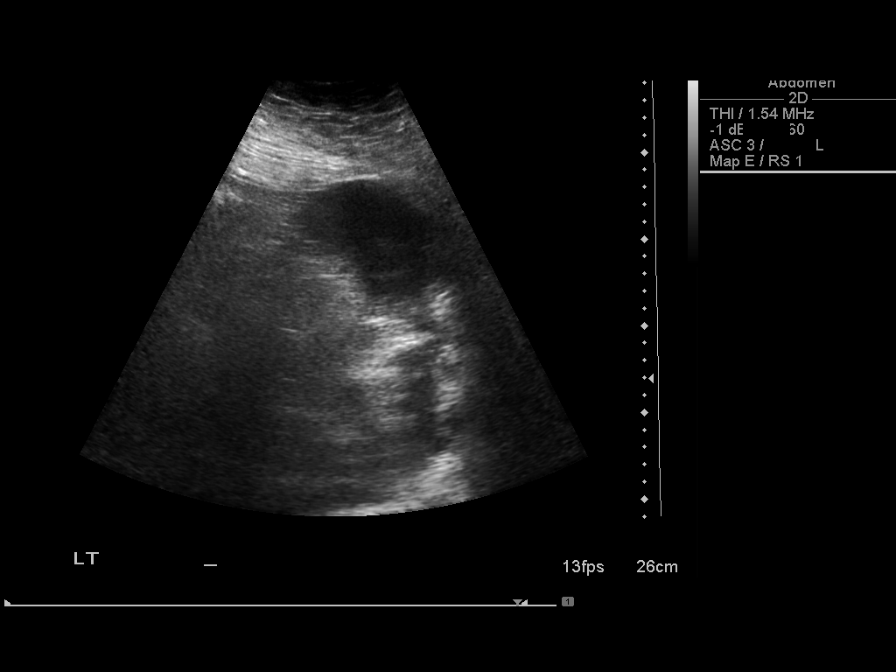
[im 7/8]
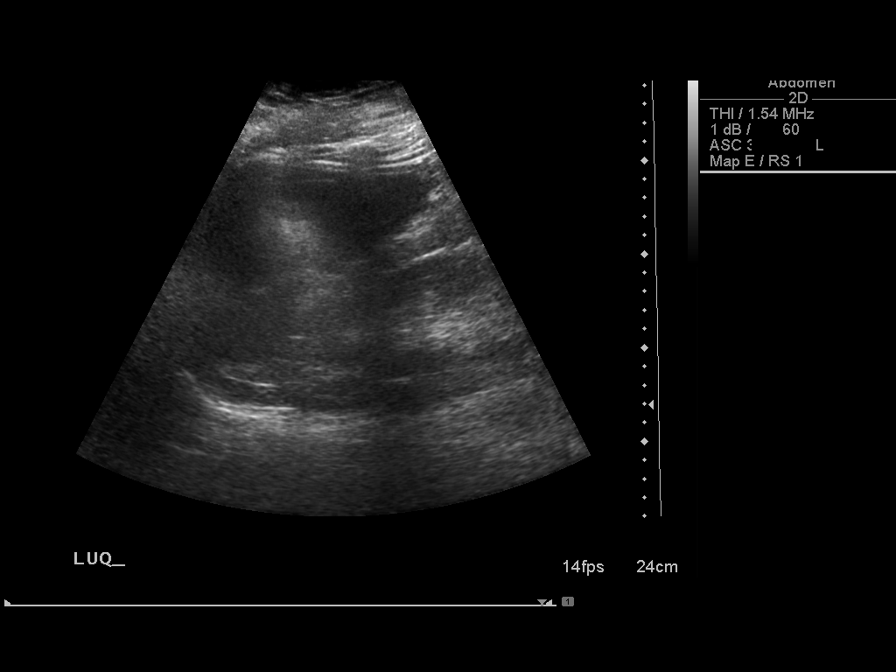
[im 8/8]
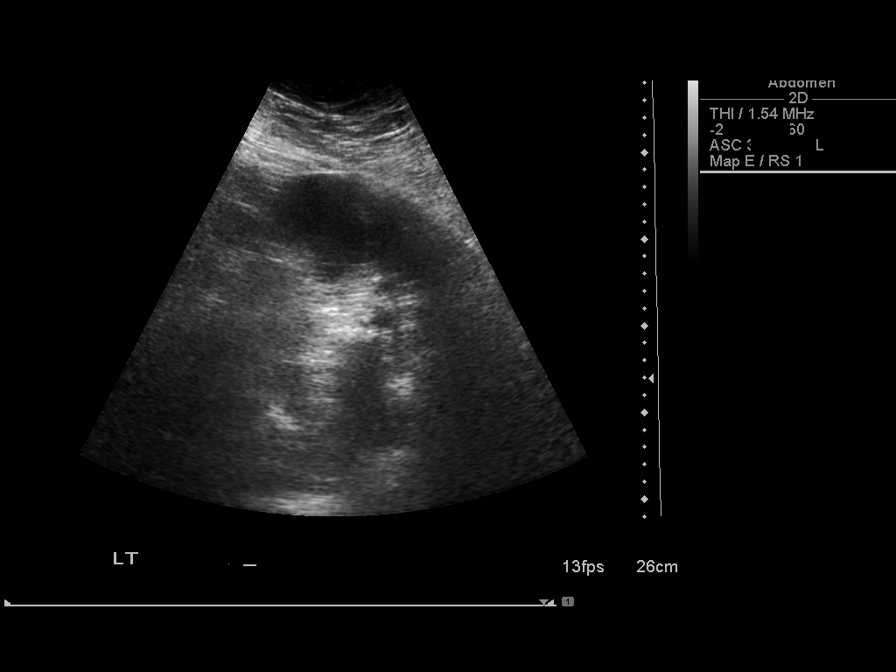

[8 of 8 positions shown; findings below may reference images not displayed]

FINDINGS: Only minimal ascites is noted.  No sizable pocket to
allow for safe paracentesis is noted.
IMPRESSION: Minimal ascites.  No safe pocket is noted.

## 2014-11-22 ENCOUNTER — Telehealth: Payer: Self-pay

## 2014-11-22 NOTE — Telephone Encounter (Signed)
Clifton James stated that Ms. Thibodaux was leaking aroundher foley.  Clifton James could not irrigate it.  There were calcium deposits in the catheter opening.  The deposits reoccurred with a new 18 fr. Foley cath.   Told Clifton James that it would be fine to leave catheter out.  Dr. Lyman Speller said to DC multivitamin, calcium, and vitamin D.   Family requested aids for twice a week today and know that Ms. Flagler can be brought to [facility] to manage her end of life care. Benson [place] that Dr. Marko Plume is out of the office this week.  She can obtain orders from [facility] physicians if needed.  Carlos verbalized understanding.

## 2014-11-29 ENCOUNTER — Telehealth: Payer: Self-pay

## 2014-11-29 NOTE — Telephone Encounter (Signed)
Faxed signed orders dated 11-29-14 to Hospice.  Sent a copy to [facility] to be scanned into patient's EMR.

## 2014-12-02 ENCOUNTER — Telehealth: Payer: Self-pay

## 2014-12-02 ENCOUNTER — Ambulatory Visit: Payer: BLUE CROSS/BLUE SHIELD | Admitting: Gynecologic Oncology

## 2014-12-02 NOTE — Telephone Encounter (Signed)
[name] stated that Ms. Burpee is having a lot of status changes. There is an increase of somnolence and lethargy. There is a decrease in PleurX drainage.  The drainage is becoming more bloody.

## 2014-12-06 ENCOUNTER — Telehealth: Payer: Self-pay | Admitting: Oncology

## 2014-12-06 NOTE — Telephone Encounter (Signed)
Medical Oncology  Per Hospice, patient died at home 2014-12-30. Called home and spoke with aunt, extended my sympathy to all of family. Aunt was with her when she died "very peaceful, and she had been so tired".  Will let Dr Skeet Latch and Dr Olen Pel know.  Godfrey Pick, MD

## 2014-12-23 DEATH — deceased

## 2015-03-21 IMAGING — XA IR FLUORO GUIDE CV LINE*L*
1 series · 2 of 2 positions shown · non-contrast
Comparison: none

EXAM:
1.  ULTRASOUND AND FLUOROSCOPIC GUIDED PICC LINE INSERTION

2.  PORT A CATHETER REMOVAL
MEDICATIONS:
Fentanyl 50 mcg IV; Ancef 3 g IV, the antibiotics were administered
within an appropriate time frame prior to the initiation of the
procedure.
Sedation time: 45 min
TECHNIQUE: The procedure, risks, benefits, and alternatives were explained to
the patient and informed written consent was obtained. A timeout was
performed prior to the initiation of the procedure.
INDICATION: History of metastatic endometrial cancer, post ultrasound and
fluoroscopic guided Port a Catheter placement -10/31/2012, now with
right upper extremity DVT. Please removed existing port-a-catheter
and place left upper extremity approach PICC line for continued
venous access.

[Series 300: line placements · 2 of 2 slices shown]
[im 1/2]
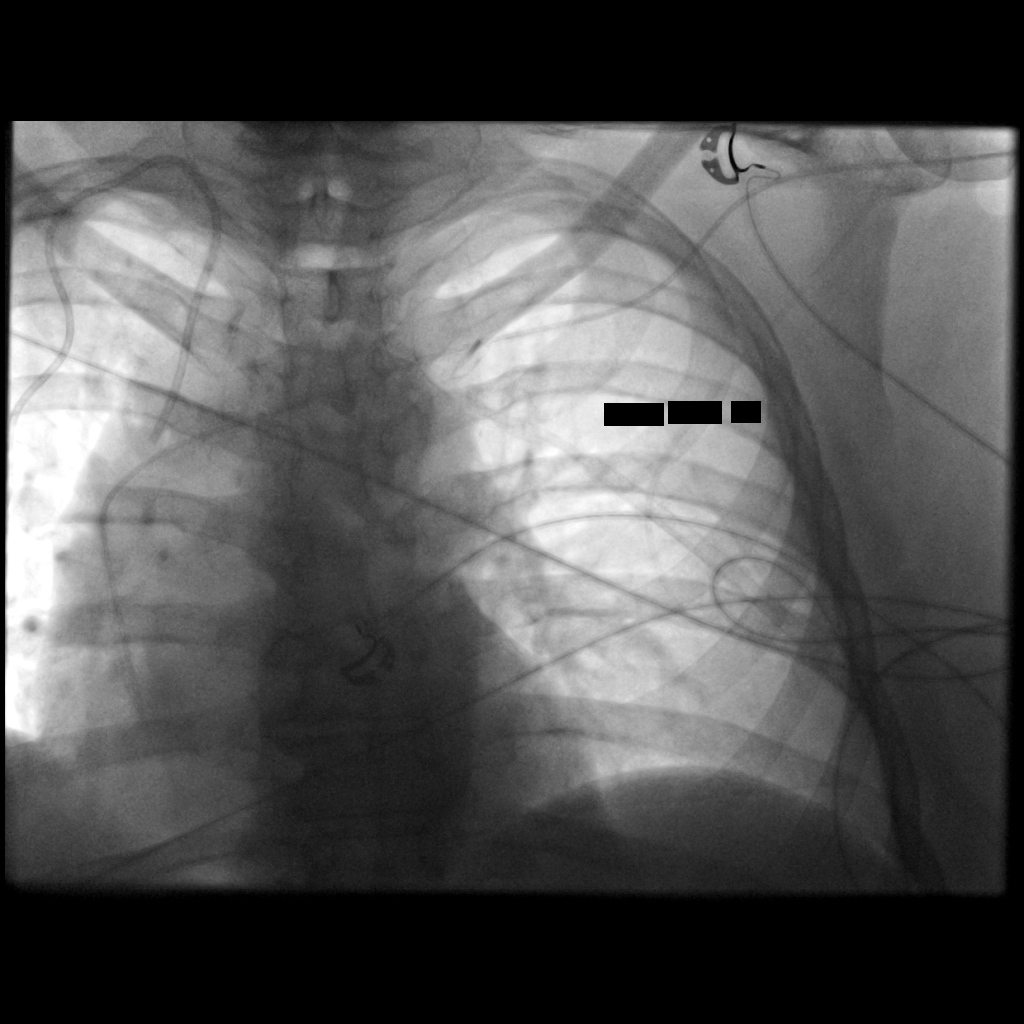
[im 2/2]
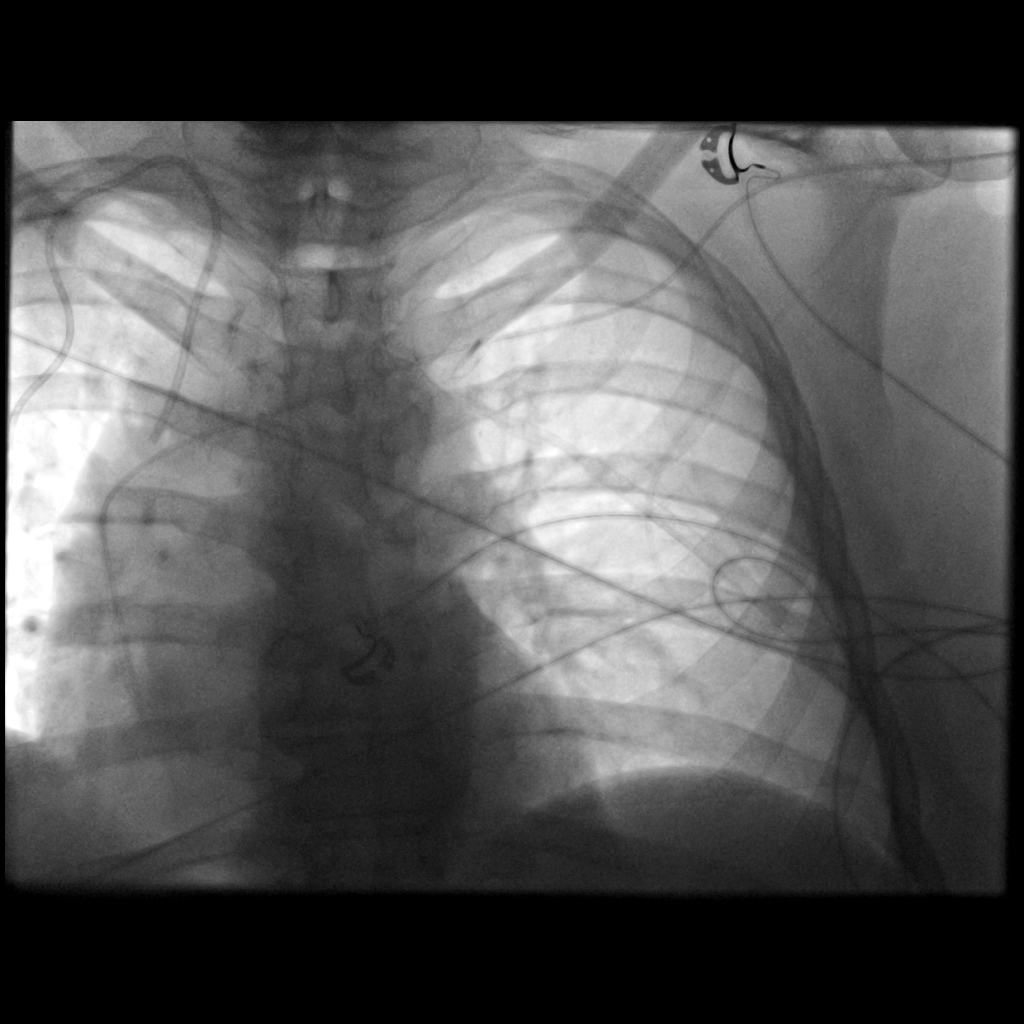

[2 of 2 positions shown; findings below may reference images not displayed]

The left upper extremity was prepped with chlorhexidine in a sterile
fashion, and a sterile drape was applied covering the operative
field. Maximum barrier sterile technique with sterile gowns and
gloves were used for the procedure. A timeout was performed prior to
the initiation of the procedure. Local anesthesia was provided with
1% lidocaine.

Under direct ultrasound guidance, the left cephalic vein was
accessed with a micropuncture kit after the overlying soft tissues
were anesthetized with 1% lidocaine. An ultrasound image was saved
for documentation purposes. A guidewire was advanced to the level of
the superior caval-atrial junction for measurement purposes and the
PICC line was cut to length. A peel-away sheath was placed and a 55
cm, 5 French, single lumen was inserted to level of the superior
caval-atrial junction. A post procedure spot fluoroscopic was
obtained. The catheter easily aspirated and flushed and was sutured
in place. A dressing was placed.

Attention was now paid to towards removal of the right anterior
chest wall Port a Catheter. The right chest Port-A-Cath site was
prepped with chlorhexidine. A sterile gown and gloves were worn
during the procedure. Local anesthesia was provided with 1%
lidocaine with epinephrine.

An incision was made overlying the Port-A-Cath with a #15 scalpel.
Utilizing sharp and blunt dissection, the Port-A-Cath was removed
completely. The pocked was irrigated with sterile saline. Wound
closure was performed with subcutaneous 3-0 Monocryl, subcuticular
4-0 Vicryl and Dermabond. A dressing was placed. The patient
tolerated the procedure well without immediate post procedural
complication.

CONTRAST:  None

FLUOROSCOPY TIME:  2 minutes, 36 seconds.

COMPLICATIONS:
None immediate
FINDINGS: After PICC placement, the tip lies within the superior cavoatrial
junction. The catheter aspirates and flushes normally and is ready
for immediate use.
IMPRESSION: 1. Successful ultrasound and fluoroscopic guided placement of a left
cephalic vein approach, 55 cm, 5 French, single lumen PICC with tip
at the superior caval-atrial junction. The PICC line is ready for
immediate use.
2. Successful removal of right anterior chest wall Port a Catheter.

## 2015-03-21 IMAGING — XA IR REMOVAL TUNNELED CV CATH W/PORT/PUMP
1 series · 1 of 1 positions shown · non-contrast
Comparison: none

EXAM:
1.  ULTRASOUND AND FLUOROSCOPIC GUIDED PICC LINE INSERTION

2.  PORT A CATHETER REMOVAL
MEDICATIONS:
Fentanyl 50 mcg IV; Ancef 3 g IV, the antibiotics were administered
within an appropriate time frame prior to the initiation of the
procedure.
Sedation time: 45 min
TECHNIQUE: The procedure, risks, benefits, and alternatives were explained to
the patient and informed written consent was obtained. A timeout was
performed prior to the initiation of the procedure.
INDICATION: History of metastatic endometrial cancer, post ultrasound and
fluoroscopic guided Port a Catheter placement -10/31/2012, now with
right upper extremity DVT. Please removed existing port-a-catheter
and place left upper extremity approach PICC line for continued
venous access.

[Series 1: care single · 1 of 1 slices shown]
[im 1/1  full-range]
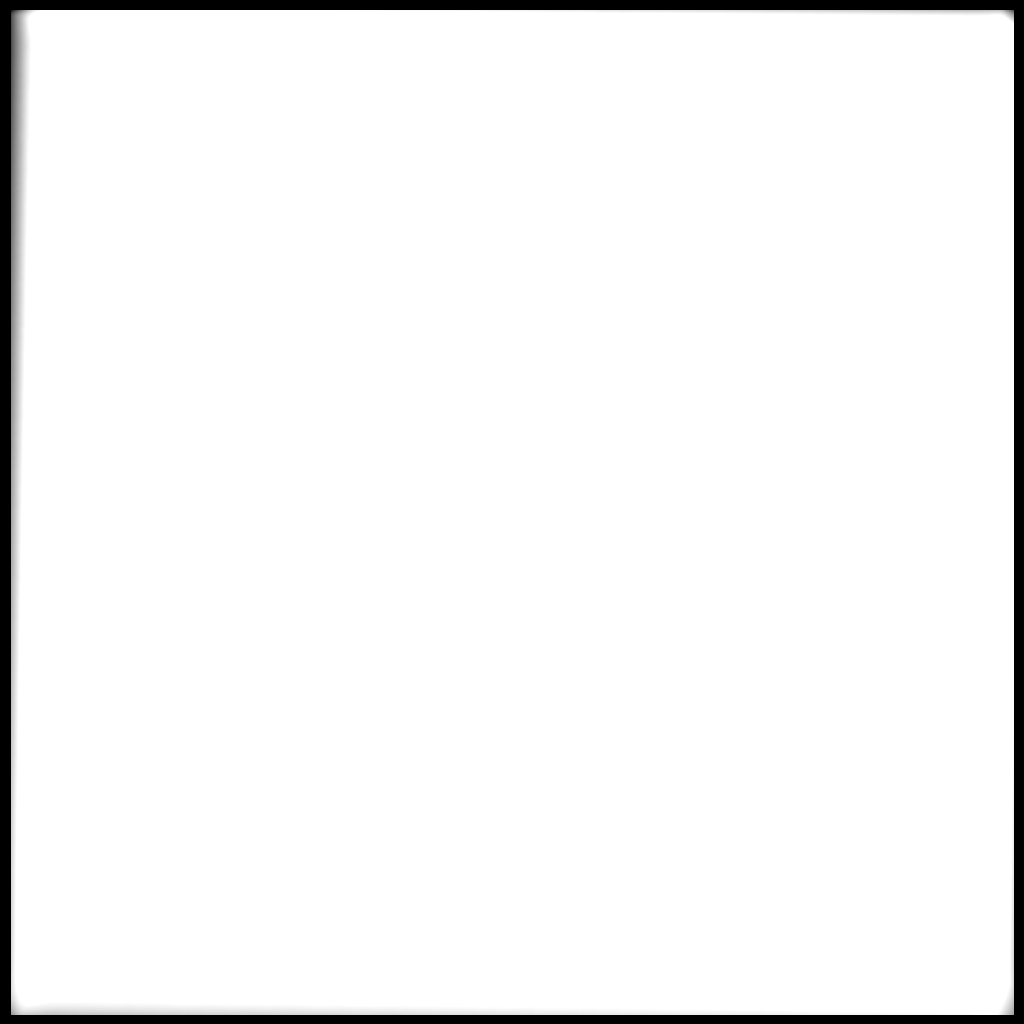

[1 of 1 positions shown; findings below may reference images not displayed]

The left upper extremity was prepped with chlorhexidine in a sterile
fashion, and a sterile drape was applied covering the operative
field. Maximum barrier sterile technique with sterile gowns and
gloves were used for the procedure. A timeout was performed prior to
the initiation of the procedure. Local anesthesia was provided with
1% lidocaine.

Under direct ultrasound guidance, the left cephalic vein was
accessed with a micropuncture kit after the overlying soft tissues
were anesthetized with 1% lidocaine. An ultrasound image was saved
for documentation purposes. A guidewire was advanced to the level of
the superior caval-atrial junction for measurement purposes and the
PICC line was cut to length. A peel-away sheath was placed and a 55
cm, 5 French, single lumen was inserted to level of the superior
caval-atrial junction. A post procedure spot fluoroscopic was
obtained. The catheter easily aspirated and flushed and was sutured
in place. A dressing was placed.

Attention was now paid to towards removal of the right anterior
chest wall Port a Catheter. The right chest Port-A-Cath site was
prepped with chlorhexidine. A sterile gown and gloves were worn
during the procedure. Local anesthesia was provided with 1%
lidocaine with epinephrine.

An incision was made overlying the Port-A-Cath with a #15 scalpel.
Utilizing sharp and blunt dissection, the Port-A-Cath was removed
completely. The pocked was irrigated with sterile saline. Wound
closure was performed with subcutaneous 3-0 Monocryl, subcuticular
4-0 Vicryl and Dermabond. A dressing was placed. The patient
tolerated the procedure well without immediate post procedural
complication.

CONTRAST:  None

FLUOROSCOPY TIME:  2 minutes, 36 seconds.

COMPLICATIONS:
None immediate
FINDINGS: After PICC placement, the tip lies within the superior cavoatrial
junction. The catheter aspirates and flushes normally and is ready
for immediate use.
IMPRESSION: 1. Successful ultrasound and fluoroscopic guided placement of a left
cephalic vein approach, 55 cm, 5 French, single lumen PICC with tip
at the superior caval-atrial junction. The PICC line is ready for
immediate use.
2. Successful removal of right anterior chest wall Port a Catheter.

## 2015-10-10 ENCOUNTER — Other Ambulatory Visit: Payer: Self-pay | Admitting: Nurse Practitioner

## 2016-09-06 IMAGING — US US PARACENTESIS
1 series · 8 of 8 positions shown · non-contrast
Comparison: Prior paracentesis on 11/24/2012

MEDICATIONS:
None.

COMPLICATIONS:
None immediate

INDICATION: Endometrial cancer, recurrent ascites. Request is made for
therapeutic paracentesis.

EXAM:
ULTRASOUND-GUIDED THERAPEUTIC PARACENTESIS
TECHNIQUE: Informed written consent was obtained from the patient after a
discussion of the risks, benefits and alternatives to treatment. A
timeout was performed prior to the initiation of the procedure.

[Series 1: us paracentesis · 0.26mm/px · 8 of 8 slices shown]
[im 1/8]
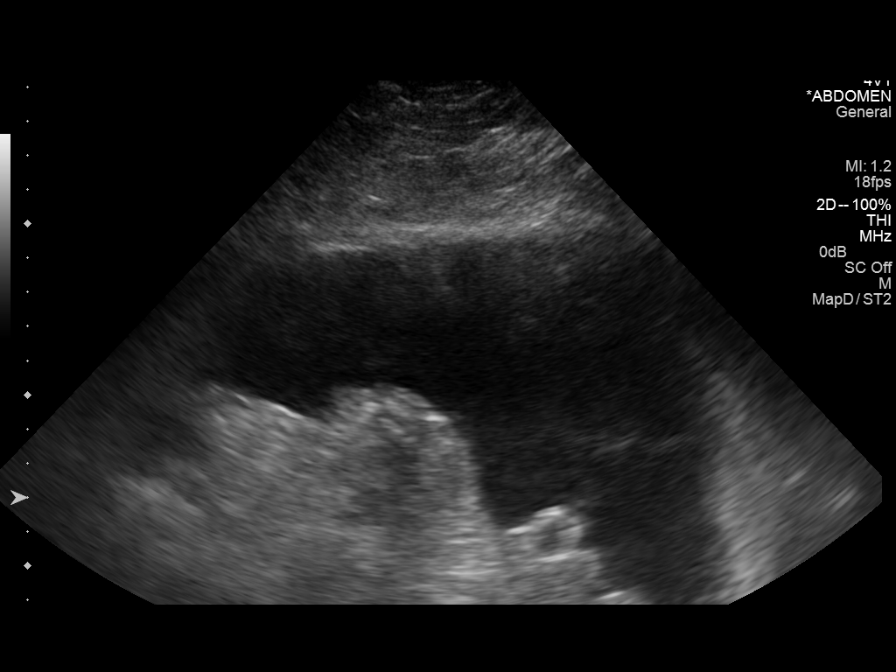
[im 2/8]
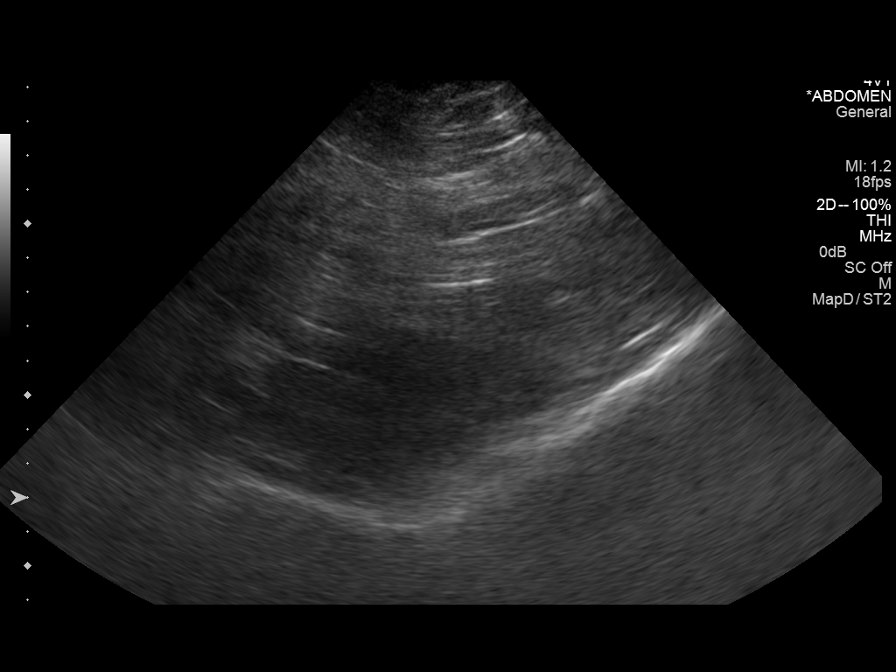
[im 3/8]
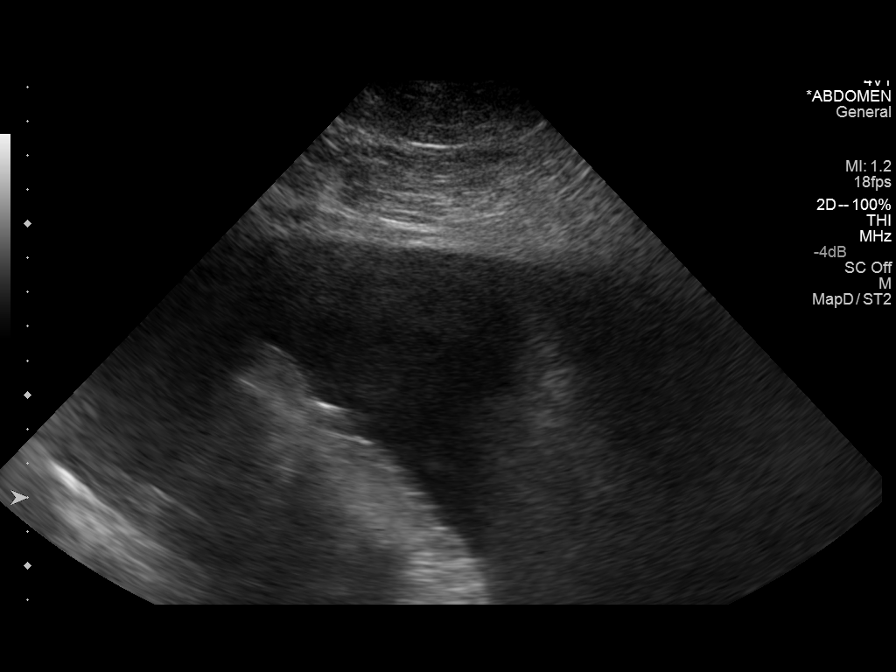
[im 4/8]
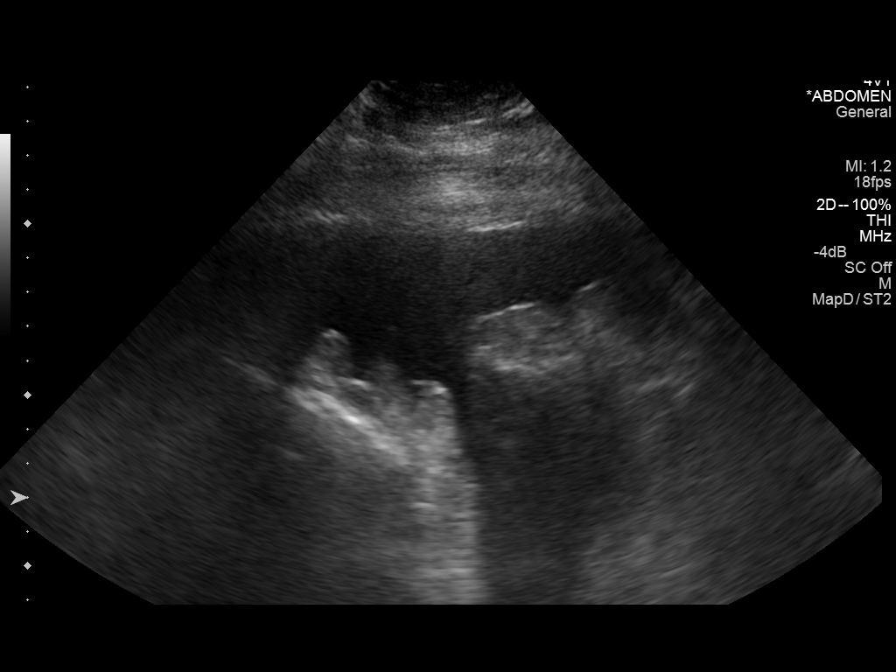
[im 5/8]
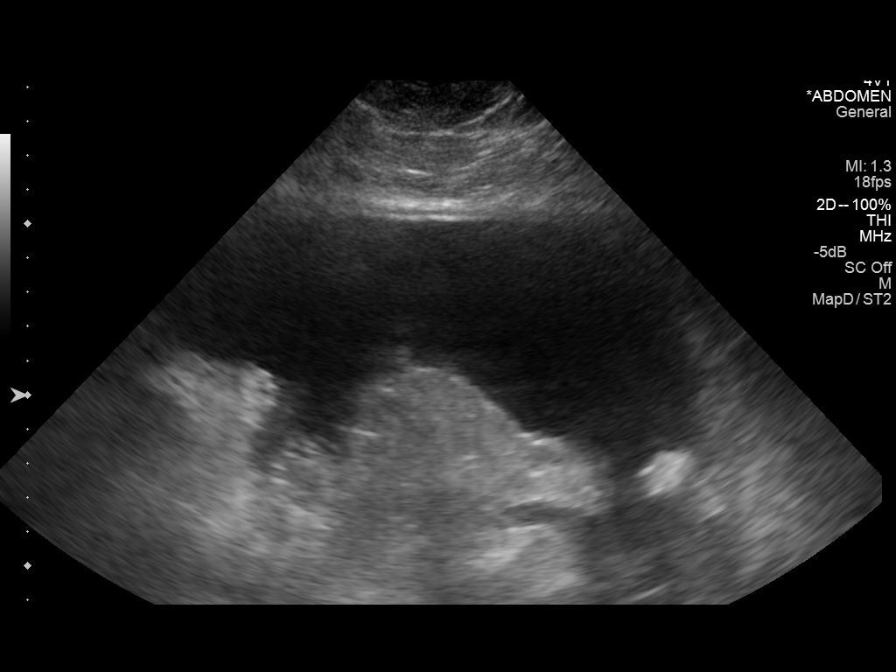
[im 6/8]
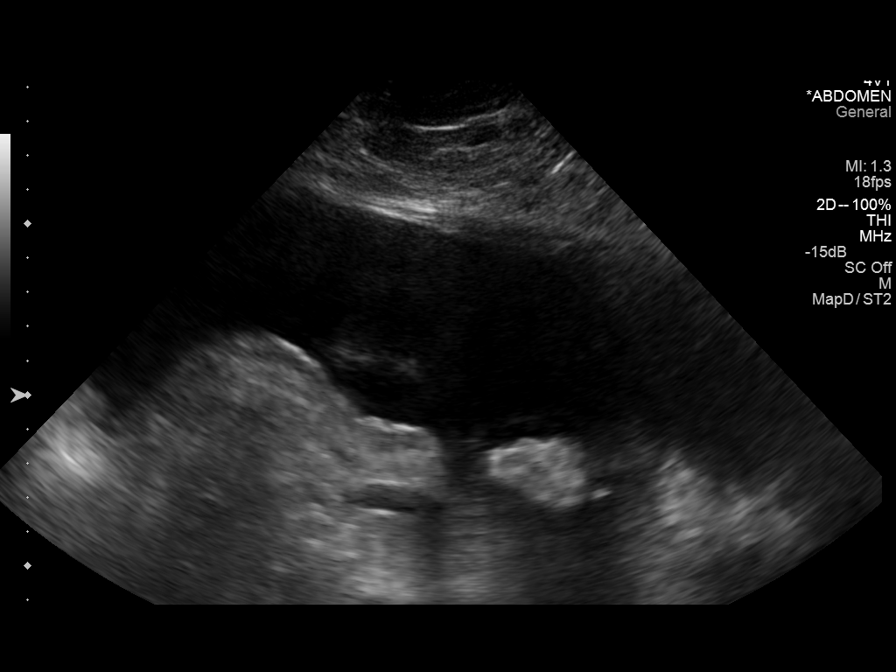
[im 7/8]
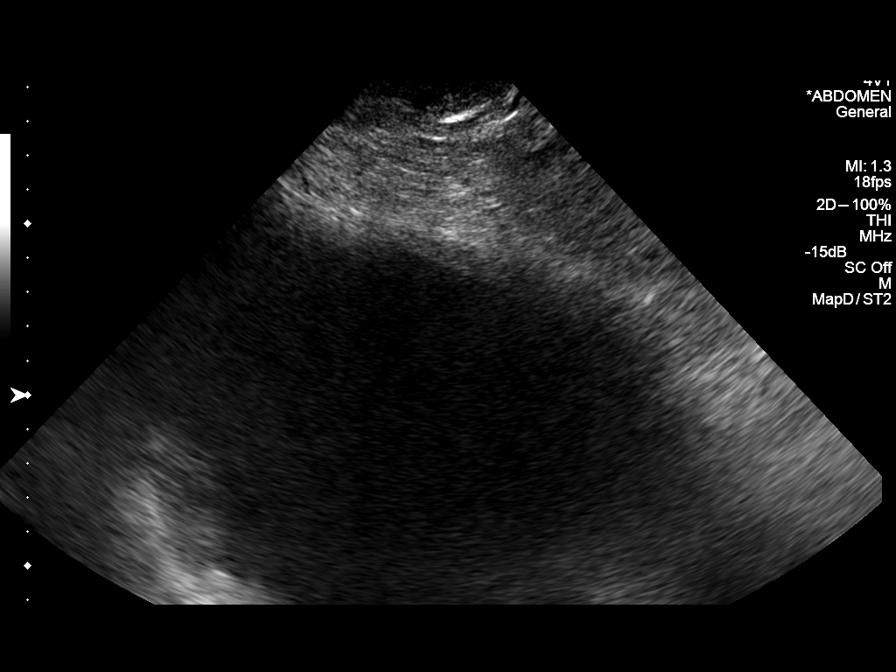
[im 8/8]
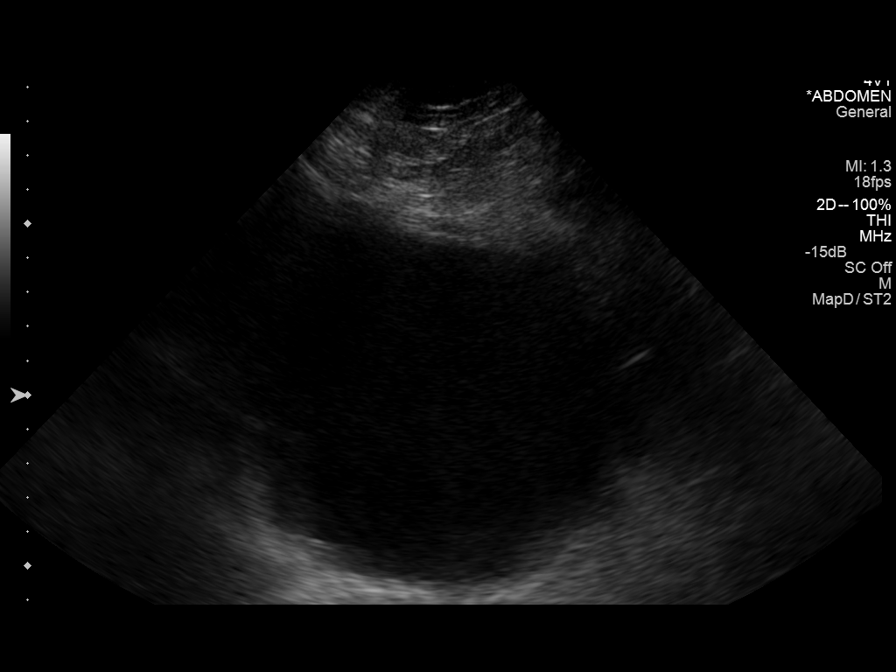

[8 of 8 positions shown; findings below may reference images not displayed]

Initial ultrasound scanning demonstrates a large amount of ascites
within the right mid to lower abdominal quadrant. The right mid to
lower abdomen was prepped and draped in the usual sterile fashion.
1% lidocaine was used for local anesthesia. Under direct ultrasound
guidance, a 19 gauge, 15-cm, Yueh catheter was introduced. An
ultrasound image was saved for documentation purposed. The
paracentesis was performed. The catheter was removed and a dressing
was applied. The patient tolerated the procedure well without
immediate post procedural complication.
FINDINGS: A total of approximately 7 liters of amber fluid was removed.
IMPRESSION: Successful ultrasound-guided therapeutic paracentesis yielding 7
liters of peritoneal fluid.
